# Patient Record
Sex: Male | Born: 1935
Health system: Southern US, Community
[De-identification: ages and names within clinical notes are randomized; demographics above are authoritative.]

## PROBLEM LIST (undated history)

## (undated) ENCOUNTER — Emergency Department (HOSPITAL_BASED_OUTPATIENT_CLINIC_OR_DEPARTMENT_OTHER): Payer: PPO

## (undated) DIAGNOSIS — N400 Enlarged prostate without lower urinary tract symptoms: Secondary | ICD-10-CM

## (undated) DIAGNOSIS — G4733 Obstructive sleep apnea (adult) (pediatric): Secondary | ICD-10-CM

## (undated) DIAGNOSIS — E669 Obesity, unspecified: Secondary | ICD-10-CM

## (undated) DIAGNOSIS — J449 Chronic obstructive pulmonary disease, unspecified: Secondary | ICD-10-CM

## (undated) DIAGNOSIS — N200 Calculus of kidney: Secondary | ICD-10-CM

## (undated) DIAGNOSIS — E785 Hyperlipidemia, unspecified: Secondary | ICD-10-CM

## (undated) DIAGNOSIS — I509 Heart failure, unspecified: Secondary | ICD-10-CM

## (undated) DIAGNOSIS — I340 Nonrheumatic mitral (valve) insufficiency: Secondary | ICD-10-CM

## (undated) DIAGNOSIS — I5032 Chronic diastolic (congestive) heart failure: Secondary | ICD-10-CM

## (undated) DIAGNOSIS — R5383 Other fatigue: Secondary | ICD-10-CM

## (undated) DIAGNOSIS — K589 Irritable bowel syndrome without diarrhea: Secondary | ICD-10-CM

## (undated) DIAGNOSIS — I4891 Unspecified atrial fibrillation: Secondary | ICD-10-CM

## (undated) DIAGNOSIS — J9611 Chronic respiratory failure with hypoxia: Secondary | ICD-10-CM

## (undated) DIAGNOSIS — I4821 Permanent atrial fibrillation: Secondary | ICD-10-CM

## (undated) DIAGNOSIS — R32 Unspecified urinary incontinence: Secondary | ICD-10-CM

## (undated) DIAGNOSIS — J849 Interstitial pulmonary disease, unspecified: Secondary | ICD-10-CM

## (undated) DIAGNOSIS — I700 Atherosclerosis of aorta: Secondary | ICD-10-CM

## (undated) DIAGNOSIS — T7840XA Allergy, unspecified, initial encounter: Secondary | ICD-10-CM

## (undated) DIAGNOSIS — H348192 Central retinal vein occlusion, unspecified eye, stable: Secondary | ICD-10-CM

## (undated) DIAGNOSIS — I671 Cerebral aneurysm, nonruptured: Secondary | ICD-10-CM

## (undated) DIAGNOSIS — I639 Cerebral infarction, unspecified: Secondary | ICD-10-CM

## (undated) DIAGNOSIS — Z860100 Personal history of colon polyps, unspecified: Secondary | ICD-10-CM

## (undated) DIAGNOSIS — Z87898 Personal history of other specified conditions: Secondary | ICD-10-CM

## (undated) DIAGNOSIS — I77810 Thoracic aortic ectasia: Secondary | ICD-10-CM

## (undated) HISTORY — DX: Allergy, unspecified, initial encounter: T78.40XA

## (undated) HISTORY — DX: Benign prostatic hyperplasia without lower urinary tract symptoms: N40.0

## (undated) HISTORY — DX: Personal history of colonic polyps: Z86.010

## (undated) HISTORY — DX: Central retinal vein occlusion, unspecified eye, stable: H34.8192

## (undated) HISTORY — PX: HERNIA REPAIR: SHX51

## (undated) HISTORY — DX: Hyperlipidemia, unspecified: E78.5

## (undated) HISTORY — DX: Unspecified atrial fibrillation: I48.91

## (undated) HISTORY — PX: CHOLECYSTECTOMY: SHX55

## (undated) HISTORY — DX: Cerebral aneurysm, nonruptured: I67.1

## (undated) HISTORY — DX: Irritable bowel syndrome, unspecified: K58.9

## (undated) HISTORY — PX: OTHER SURGICAL HISTORY: SHX169

## (undated) HISTORY — DX: Unspecified urinary incontinence: R32

## (undated) HISTORY — DX: Personal history of colon polyps, unspecified: Z86.0100

## (undated) HISTORY — DX: Personal history of other specified conditions: Z87.898

## (undated) HISTORY — DX: Other fatigue: R53.83

## (undated) HISTORY — DX: Obstructive sleep apnea (adult) (pediatric): G47.33

## (undated) HISTORY — DX: Obesity, unspecified: E66.9

## (undated) HISTORY — PX: LITHOTRIPSY: SUR834

## (undated) HISTORY — PX: TONSILLECTOMY AND ADENOIDECTOMY: SHX28

---

## 1997-12-10 HISTORY — PX: CARDIAC CATHETERIZATION: SHX172

## 1998-11-14 ENCOUNTER — Encounter: Payer: Self-pay | Admitting: Emergency Medicine

## 1998-11-14 ENCOUNTER — Inpatient Hospital Stay (HOSPITAL_COMMUNITY): Admission: EM | Admit: 1998-11-14 | Discharge: 1998-11-15 | Payer: Self-pay | Admitting: Emergency Medicine

## 1998-11-16 ENCOUNTER — Inpatient Hospital Stay (HOSPITAL_COMMUNITY): Admission: EM | Admit: 1998-11-16 | Discharge: 1998-11-18 | Payer: Self-pay | Admitting: Internal Medicine

## 2001-11-05 ENCOUNTER — Encounter: Payer: Self-pay | Admitting: Gastroenterology

## 2001-12-31 ENCOUNTER — Ambulatory Visit (HOSPITAL_COMMUNITY): Admission: RE | Admit: 2001-12-31 | Discharge: 2001-12-31 | Payer: Self-pay | Admitting: Otolaryngology

## 2001-12-31 ENCOUNTER — Encounter: Payer: Self-pay | Admitting: Otolaryngology

## 2002-04-02 ENCOUNTER — Ambulatory Visit (HOSPITAL_COMMUNITY): Admission: RE | Admit: 2002-04-02 | Discharge: 2002-04-02 | Payer: Self-pay | Admitting: Neurosurgery

## 2002-04-02 ENCOUNTER — Encounter: Payer: Self-pay | Admitting: Neurosurgery

## 2002-04-16 ENCOUNTER — Ambulatory Visit (HOSPITAL_COMMUNITY): Admission: RE | Admit: 2002-04-16 | Discharge: 2002-04-16 | Payer: Self-pay | Admitting: Interventional Radiology

## 2002-05-15 ENCOUNTER — Ambulatory Visit: Admission: RE | Admit: 2002-05-15 | Discharge: 2002-05-15 | Payer: Self-pay | Admitting: Interventional Radiology

## 2002-06-15 ENCOUNTER — Ambulatory Visit (HOSPITAL_COMMUNITY): Admission: RE | Admit: 2002-06-15 | Discharge: 2002-06-15 | Payer: Self-pay | Admitting: Interventional Radiology

## 2002-10-09 ENCOUNTER — Ambulatory Visit (HOSPITAL_COMMUNITY): Admission: RE | Admit: 2002-10-09 | Discharge: 2002-10-09 | Payer: Self-pay | Admitting: Interventional Radiology

## 2003-05-28 ENCOUNTER — Ambulatory Visit (HOSPITAL_COMMUNITY): Admission: RE | Admit: 2003-05-28 | Discharge: 2003-05-28 | Payer: Self-pay | Admitting: Interventional Radiology

## 2003-06-03 ENCOUNTER — Encounter: Admission: RE | Admit: 2003-06-03 | Discharge: 2003-06-03 | Payer: Self-pay | Admitting: Interventional Radiology

## 2003-06-24 ENCOUNTER — Ambulatory Visit (HOSPITAL_COMMUNITY): Admission: RE | Admit: 2003-06-24 | Discharge: 2003-06-24 | Payer: Self-pay | Admitting: Interventional Radiology

## 2003-08-02 ENCOUNTER — Encounter: Payer: Self-pay | Admitting: Internal Medicine

## 2003-08-02 ENCOUNTER — Encounter: Admission: RE | Admit: 2003-08-02 | Discharge: 2003-08-02 | Payer: Self-pay | Admitting: Internal Medicine

## 2003-08-30 ENCOUNTER — Encounter: Payer: Self-pay | Admitting: Radiology

## 2003-08-30 ENCOUNTER — Encounter: Admission: RE | Admit: 2003-08-30 | Discharge: 2003-08-30 | Payer: Self-pay | Admitting: Neurosurgery

## 2003-08-30 ENCOUNTER — Encounter: Payer: Self-pay | Admitting: Neurosurgery

## 2003-09-15 ENCOUNTER — Encounter: Payer: Self-pay | Admitting: Neurosurgery

## 2003-09-15 ENCOUNTER — Encounter: Admission: RE | Admit: 2003-09-15 | Discharge: 2003-09-15 | Payer: Self-pay | Admitting: Neurosurgery

## 2003-10-01 ENCOUNTER — Encounter: Admission: RE | Admit: 2003-10-01 | Discharge: 2003-10-01 | Payer: Self-pay | Admitting: Neurosurgery

## 2003-10-01 ENCOUNTER — Encounter: Payer: Self-pay | Admitting: Neurosurgery

## 2003-12-28 ENCOUNTER — Ambulatory Visit (HOSPITAL_BASED_OUTPATIENT_CLINIC_OR_DEPARTMENT_OTHER): Admission: RE | Admit: 2003-12-28 | Discharge: 2003-12-28 | Payer: Self-pay | Admitting: Internal Medicine

## 2004-02-07 ENCOUNTER — Ambulatory Visit (HOSPITAL_COMMUNITY): Admission: RE | Admit: 2004-02-07 | Discharge: 2004-02-07 | Payer: Self-pay | Admitting: Neurosurgery

## 2004-11-29 ENCOUNTER — Ambulatory Visit: Payer: Self-pay | Admitting: Internal Medicine

## 2005-01-15 ENCOUNTER — Ambulatory Visit (HOSPITAL_COMMUNITY): Admission: RE | Admit: 2005-01-15 | Discharge: 2005-01-15 | Payer: Self-pay | Admitting: Neurosurgery

## 2005-03-06 ENCOUNTER — Ambulatory Visit: Payer: Self-pay | Admitting: Gastroenterology

## 2005-06-07 ENCOUNTER — Ambulatory Visit: Payer: Self-pay | Admitting: Internal Medicine

## 2005-08-15 ENCOUNTER — Ambulatory Visit: Payer: Self-pay | Admitting: Internal Medicine

## 2005-09-24 ENCOUNTER — Ambulatory Visit: Payer: Self-pay | Admitting: Internal Medicine

## 2005-12-10 DIAGNOSIS — I4891 Unspecified atrial fibrillation: Secondary | ICD-10-CM

## 2005-12-10 HISTORY — DX: Unspecified atrial fibrillation: I48.91

## 2005-12-18 ENCOUNTER — Ambulatory Visit: Payer: Self-pay | Admitting: Internal Medicine

## 2005-12-28 ENCOUNTER — Ambulatory Visit: Payer: Self-pay | Admitting: Internal Medicine

## 2005-12-28 ENCOUNTER — Encounter: Payer: Self-pay | Admitting: Internal Medicine

## 2006-01-01 ENCOUNTER — Ambulatory Visit: Payer: Self-pay | Admitting: Internal Medicine

## 2006-01-03 ENCOUNTER — Encounter: Payer: Self-pay | Admitting: Cardiology

## 2006-01-03 ENCOUNTER — Ambulatory Visit: Payer: Self-pay

## 2006-01-03 ENCOUNTER — Encounter: Payer: Self-pay | Admitting: Internal Medicine

## 2006-01-04 ENCOUNTER — Ambulatory Visit: Payer: Self-pay | Admitting: Internal Medicine

## 2006-01-08 ENCOUNTER — Ambulatory Visit (HOSPITAL_COMMUNITY): Admission: RE | Admit: 2006-01-08 | Discharge: 2006-01-08 | Payer: Self-pay | Admitting: Neurosurgery

## 2006-01-10 ENCOUNTER — Ambulatory Visit: Payer: Self-pay | Admitting: Internal Medicine

## 2006-01-17 ENCOUNTER — Ambulatory Visit: Payer: Self-pay | Admitting: Internal Medicine

## 2006-01-23 ENCOUNTER — Ambulatory Visit (HOSPITAL_COMMUNITY): Admission: RE | Admit: 2006-01-23 | Discharge: 2006-01-23 | Payer: Self-pay | Admitting: Neurosurgery

## 2006-01-30 ENCOUNTER — Ambulatory Visit: Payer: Self-pay | Admitting: Internal Medicine

## 2006-01-31 ENCOUNTER — Ambulatory Visit: Payer: Self-pay | Admitting: Internal Medicine

## 2006-01-31 ENCOUNTER — Encounter: Admission: RE | Admit: 2006-01-31 | Discharge: 2006-01-31 | Payer: Self-pay | Admitting: Internal Medicine

## 2006-02-07 ENCOUNTER — Ambulatory Visit: Payer: Self-pay | Admitting: Internal Medicine

## 2006-02-08 ENCOUNTER — Ambulatory Visit: Payer: Self-pay

## 2006-02-08 ENCOUNTER — Ambulatory Visit: Payer: Self-pay | Admitting: Internal Medicine

## 2006-02-14 ENCOUNTER — Ambulatory Visit: Payer: Self-pay | Admitting: Internal Medicine

## 2006-02-25 ENCOUNTER — Ambulatory Visit: Payer: Self-pay | Admitting: Internal Medicine

## 2006-03-19 ENCOUNTER — Ambulatory Visit: Payer: Self-pay | Admitting: Internal Medicine

## 2006-03-21 ENCOUNTER — Ambulatory Visit: Payer: Self-pay | Admitting: Internal Medicine

## 2006-03-24 ENCOUNTER — Emergency Department (HOSPITAL_COMMUNITY): Admission: EM | Admit: 2006-03-24 | Discharge: 2006-03-25 | Payer: Self-pay | Admitting: Emergency Medicine

## 2006-04-16 ENCOUNTER — Ambulatory Visit: Payer: Self-pay | Admitting: Internal Medicine

## 2006-05-21 ENCOUNTER — Ambulatory Visit: Payer: Self-pay | Admitting: Internal Medicine

## 2006-05-24 ENCOUNTER — Ambulatory Visit: Payer: Self-pay | Admitting: Internal Medicine

## 2006-06-06 ENCOUNTER — Ambulatory Visit: Payer: Self-pay | Admitting: Internal Medicine

## 2006-06-18 ENCOUNTER — Ambulatory Visit: Payer: Self-pay | Admitting: Internal Medicine

## 2006-06-21 ENCOUNTER — Encounter: Payer: Self-pay | Admitting: Internal Medicine

## 2006-07-18 ENCOUNTER — Ambulatory Visit: Payer: Self-pay | Admitting: Internal Medicine

## 2006-08-20 ENCOUNTER — Ambulatory Visit: Payer: Self-pay | Admitting: Internal Medicine

## 2006-08-29 ENCOUNTER — Ambulatory Visit: Payer: Self-pay | Admitting: Internal Medicine

## 2006-09-24 ENCOUNTER — Ambulatory Visit: Payer: Self-pay | Admitting: Internal Medicine

## 2006-10-28 ENCOUNTER — Ambulatory Visit: Payer: Self-pay | Admitting: Internal Medicine

## 2006-11-18 ENCOUNTER — Ambulatory Visit: Payer: Self-pay | Admitting: Internal Medicine

## 2006-11-25 ENCOUNTER — Ambulatory Visit: Payer: Self-pay | Admitting: Internal Medicine

## 2006-12-24 ENCOUNTER — Ambulatory Visit: Payer: Self-pay | Admitting: Internal Medicine

## 2006-12-24 LAB — CONVERTED CEMR LAB
ALT: 15 units/L (ref 0–40)
AST: 22 units/L (ref 0–37)
Albumin: 3.4 g/dL — ABNORMAL LOW (ref 3.5–5.2)
Alkaline Phosphatase: 78 units/L (ref 39–117)
BUN: 12 mg/dL (ref 6–23)
Bilirubin, Direct: 0.1 mg/dL (ref 0.0–0.3)
CO2: 28 meq/L (ref 19–32)
Calcium: 9.3 mg/dL (ref 8.4–10.5)
Chloride: 106 meq/L (ref 96–112)
Cholesterol: 195 mg/dL (ref 0–200)
Creatinine, Ser: 1.2 mg/dL (ref 0.4–1.5)
Direct LDL: 91.9 mg/dL
GFR calc Af Amer: 77 mL/min
GFR calc non Af Amer: 63 mL/min
Glucose, Bld: 138 mg/dL — ABNORMAL HIGH (ref 70–99)
HDL: 36.2 mg/dL — ABNORMAL LOW (ref >39.0)
Hgb A1c MFr Bld: 6.4 % — ABNORMAL HIGH (ref 4.6–6.0)
Potassium: 5.1 meq/L (ref 3.5–5.1)
Sodium: 141 meq/L (ref 135–145)
Total Bilirubin: 1.2 mg/dL (ref 0.3–1.2)
Total CHOL/HDL Ratio: 5.4
Total Protein: 6.1 g/dL (ref 6.0–8.3)
Triglycerides: 473 mg/dL (ref 0–149)
VLDL: 95 mg/dL — ABNORMAL HIGH (ref 0–40)

## 2007-01-21 ENCOUNTER — Ambulatory Visit: Payer: Self-pay | Admitting: Internal Medicine

## 2007-01-27 ENCOUNTER — Ambulatory Visit (HOSPITAL_COMMUNITY): Admission: RE | Admit: 2007-01-27 | Discharge: 2007-01-27 | Payer: Self-pay | Admitting: Neurosurgery

## 2007-02-18 ENCOUNTER — Ambulatory Visit: Payer: Self-pay | Admitting: Internal Medicine

## 2007-03-21 ENCOUNTER — Ambulatory Visit: Payer: Self-pay | Admitting: Internal Medicine

## 2007-04-23 ENCOUNTER — Ambulatory Visit: Payer: Self-pay | Admitting: Internal Medicine

## 2007-04-23 LAB — CONVERTED CEMR LAB
ALT: 17 units/L (ref 0–40)
AST: 18 units/L (ref 0–37)
Albumin: 3.7 g/dL (ref 3.5–5.2)
Alkaline Phosphatase: 78 units/L (ref 39–117)
BUN: 17 mg/dL (ref 6–23)
Bilirubin, Direct: 0.1 mg/dL (ref 0.0–0.3)
CO2: 31 meq/L (ref 19–32)
Calcium: 9.1 mg/dL (ref 8.4–10.5)
Chloride: 107 meq/L (ref 96–112)
Cholesterol: 191 mg/dL (ref 0–200)
Creatinine, Ser: 1 mg/dL (ref 0.4–1.5)
Direct LDL: 100.1 mg/dL
GFR calc Af Amer: 95 mL/min
GFR calc non Af Amer: 78 mL/min
Glucose, Bld: 107 mg/dL — ABNORMAL HIGH (ref 70–99)
HDL: 40 mg/dL (ref >39.0)
Hgb A1c MFr Bld: 6.3 % — ABNORMAL HIGH (ref 4.6–6.0)
Potassium: 3.8 meq/L (ref 3.5–5.1)
Sodium: 145 meq/L (ref 135–145)
Total Bilirubin: 1.2 mg/dL (ref 0.3–1.2)
Total CHOL/HDL Ratio: 4.8
Total Protein: 7 g/dL (ref 6.0–8.3)
Triglycerides: 257 mg/dL (ref 0–149)
VLDL: 51 mg/dL — ABNORMAL HIGH (ref 0–40)

## 2007-04-30 ENCOUNTER — Ambulatory Visit: Payer: Self-pay | Admitting: Internal Medicine

## 2007-05-25 ENCOUNTER — Emergency Department (HOSPITAL_COMMUNITY): Admission: EM | Admit: 2007-05-25 | Discharge: 2007-05-26 | Payer: Self-pay | Admitting: Emergency Medicine

## 2007-06-05 ENCOUNTER — Ambulatory Visit: Payer: Self-pay | Admitting: *Deleted

## 2007-06-11 ENCOUNTER — Ambulatory Visit: Payer: Self-pay | Admitting: Internal Medicine

## 2007-07-14 ENCOUNTER — Ambulatory Visit: Payer: Self-pay | Admitting: Internal Medicine

## 2007-07-14 DIAGNOSIS — I4821 Permanent atrial fibrillation: Secondary | ICD-10-CM | POA: Insufficient documentation

## 2007-07-14 LAB — CONVERTED CEMR LAB
INR: 2
Prothrombin Time: 17.3 s

## 2007-07-23 DIAGNOSIS — Z860100 Personal history of colon polyps, unspecified: Secondary | ICD-10-CM | POA: Insufficient documentation

## 2007-07-23 DIAGNOSIS — E291 Testicular hypofunction: Secondary | ICD-10-CM | POA: Insufficient documentation

## 2007-07-23 DIAGNOSIS — E785 Hyperlipidemia, unspecified: Secondary | ICD-10-CM | POA: Insufficient documentation

## 2007-07-24 DIAGNOSIS — I7771 Dissection of carotid artery: Secondary | ICD-10-CM | POA: Insufficient documentation

## 2007-07-24 DIAGNOSIS — G4733 Obstructive sleep apnea (adult) (pediatric): Secondary | ICD-10-CM | POA: Insufficient documentation

## 2007-08-12 ENCOUNTER — Ambulatory Visit: Payer: Self-pay | Admitting: Internal Medicine

## 2007-08-13 ENCOUNTER — Ambulatory Visit: Payer: Self-pay | Admitting: Internal Medicine

## 2007-08-13 LAB — CONVERTED CEMR LAB
INR: 1.8
Prothrombin Time: 16.3 s

## 2007-08-26 ENCOUNTER — Ambulatory Visit: Payer: Self-pay | Admitting: Internal Medicine

## 2007-08-26 LAB — CONVERTED CEMR LAB
INR: 2.2
Prothrombin Time: 18 s

## 2007-09-11 ENCOUNTER — Ambulatory Visit: Payer: Self-pay | Admitting: Internal Medicine

## 2007-09-11 LAB — CONVERTED CEMR LAB
INR: 2.1
Prothrombin Time: 17.8 s

## 2007-10-13 ENCOUNTER — Ambulatory Visit: Payer: Self-pay | Admitting: Internal Medicine

## 2007-10-13 LAB — CONVERTED CEMR LAB
INR: 2
Prothrombin Time: 17.5 s

## 2007-11-19 ENCOUNTER — Ambulatory Visit: Payer: Self-pay | Admitting: Internal Medicine

## 2007-11-19 LAB — CONVERTED CEMR LAB
INR: 2.1
Prothrombin Time: 17.7 s

## 2007-12-18 ENCOUNTER — Ambulatory Visit: Payer: Self-pay | Admitting: Internal Medicine

## 2007-12-19 ENCOUNTER — Ambulatory Visit: Payer: Self-pay | Admitting: Internal Medicine

## 2007-12-19 DIAGNOSIS — R972 Elevated prostate specific antigen [PSA]: Secondary | ICD-10-CM | POA: Insufficient documentation

## 2007-12-19 DIAGNOSIS — D649 Anemia, unspecified: Secondary | ICD-10-CM | POA: Insufficient documentation

## 2007-12-19 LAB — CONVERTED CEMR LAB
ALT: 18 units/L (ref 0–53)
CO2: 28 meq/L (ref 19–32)
Calcium: 9.3 mg/dL (ref 8.4–10.5)
Chloride: 106 meq/L (ref 96–112)
GFR calc Af Amer: 94 mL/min
GFR calc non Af Amer: 78 mL/min
Glucose, Bld: 111 mg/dL — ABNORMAL HIGH (ref 70–99)
HCT: 45.3 % (ref 39.0–52.0)
Hemoglobin: 15.5 g/dL (ref 13.0–17.0)
MCHC: 34.3 g/dL (ref 30.0–36.0)
MCV: 88.3 fL (ref 78.0–100.0)
Monocytes Relative: 5.8 % (ref 3.0–11.0)
Neutro Abs: 3.6 10*3/uL (ref 1.4–7.7)
Neutrophils Relative %: 62.7 % (ref 43.0–77.0)
Platelets: 149 10*3/uL — ABNORMAL LOW (ref 150–400)
Potassium: 5.4 meq/L — ABNORMAL HIGH (ref 3.5–5.1)
Prothrombin Time: 17 s
Sodium: 141 meq/L (ref 135–145)
Total Bilirubin: 1.3 mg/dL — ABNORMAL HIGH (ref 0.3–1.2)
Triglycerides: 201 mg/dL (ref 0–149)
VLDL: 40 mg/dL (ref 0–40)
WBC: 5.6 10*3/uL (ref 4.5–10.5)

## 2007-12-25 ENCOUNTER — Ambulatory Visit: Payer: Self-pay | Admitting: Internal Medicine

## 2008-01-12 ENCOUNTER — Ambulatory Visit: Payer: Self-pay | Admitting: Internal Medicine

## 2008-01-19 ENCOUNTER — Ambulatory Visit: Payer: Self-pay

## 2008-01-19 ENCOUNTER — Ambulatory Visit: Payer: Self-pay | Admitting: Internal Medicine

## 2008-01-19 ENCOUNTER — Encounter: Payer: Self-pay | Admitting: Internal Medicine

## 2008-01-19 LAB — CONVERTED CEMR LAB
Digitoxin Lvl: 1.1 ng/mL (ref 0.8–2.0)
INR: 1.9
Prothrombin Time: 17.1 s

## 2008-02-16 ENCOUNTER — Ambulatory Visit: Payer: Self-pay | Admitting: Internal Medicine

## 2008-02-16 LAB — CONVERTED CEMR LAB
INR: 2.6
Prothrombin Time: 19.7 s

## 2008-03-10 LAB — HM COLONOSCOPY

## 2008-03-15 ENCOUNTER — Ambulatory Visit: Payer: Self-pay | Admitting: Internal Medicine

## 2008-03-15 LAB — CONVERTED CEMR LAB
INR: 2.5
Prothrombin Time: 19.3 s

## 2008-03-23 ENCOUNTER — Ambulatory Visit: Payer: Self-pay | Admitting: Gastroenterology

## 2008-04-07 ENCOUNTER — Ambulatory Visit: Payer: Self-pay | Admitting: Gastroenterology

## 2008-04-07 ENCOUNTER — Encounter: Payer: Self-pay | Admitting: Internal Medicine

## 2008-04-13 ENCOUNTER — Ambulatory Visit: Payer: Self-pay | Admitting: Internal Medicine

## 2008-04-14 ENCOUNTER — Ambulatory Visit: Payer: Self-pay | Admitting: Internal Medicine

## 2008-04-15 ENCOUNTER — Ambulatory Visit: Payer: Self-pay | Admitting: Internal Medicine

## 2008-05-12 ENCOUNTER — Ambulatory Visit: Payer: Self-pay | Admitting: Internal Medicine

## 2008-05-31 ENCOUNTER — Ambulatory Visit: Payer: Self-pay | Admitting: Internal Medicine

## 2008-06-10 ENCOUNTER — Ambulatory Visit: Payer: Self-pay | Admitting: Internal Medicine

## 2008-06-10 LAB — CONVERTED CEMR LAB
AST: 24 units/L (ref 0–37)
Albumin: 3.5 g/dL (ref 3.5–5.2)
Alkaline Phosphatase: 63 units/L (ref 39–117)
BUN: 12 mg/dL (ref 6–23)
Cholesterol: 178 mg/dL (ref 0–200)
Creatinine, Ser: 1 mg/dL (ref 0.4–1.5)
GFR calc Af Amer: 94 mL/min
GFR calc non Af Amer: 78 mL/min
Glucose, Bld: 110 mg/dL — ABNORMAL HIGH (ref 70–99)
HDL: 42.5 mg/dL (ref >39.0)
Hgb A1c MFr Bld: 6.1 % — ABNORMAL HIGH (ref 4.6–6.0)
INR: 3.2
Prothrombin Time: 21.6 s
Total Protein: 6.4 g/dL (ref 6.0–8.3)
VLDL: 41 mg/dL — ABNORMAL HIGH (ref 0–40)

## 2008-06-18 ENCOUNTER — Ambulatory Visit: Payer: Self-pay | Admitting: Internal Medicine

## 2008-06-18 DIAGNOSIS — N400 Enlarged prostate without lower urinary tract symptoms: Secondary | ICD-10-CM | POA: Insufficient documentation

## 2008-06-18 LAB — CONVERTED CEMR LAB
Bilirubin Urine: NEGATIVE
Glucose, Urine, Semiquant: NEGATIVE
Urobilinogen, UA: 2
WBC Urine, dipstick: NEGATIVE

## 2008-06-28 ENCOUNTER — Telehealth (INDEPENDENT_AMBULATORY_CARE_PROVIDER_SITE_OTHER): Payer: Self-pay | Admitting: *Deleted

## 2008-07-16 ENCOUNTER — Ambulatory Visit: Payer: Self-pay | Admitting: Internal Medicine

## 2008-07-16 LAB — CONVERTED CEMR LAB: INR: 2.8

## 2008-08-27 ENCOUNTER — Ambulatory Visit: Payer: Self-pay | Admitting: Internal Medicine

## 2008-08-27 LAB — CONVERTED CEMR LAB: INR: 2.6

## 2008-09-14 ENCOUNTER — Ambulatory Visit: Payer: Self-pay | Admitting: Internal Medicine

## 2008-09-24 ENCOUNTER — Ambulatory Visit: Payer: Self-pay | Admitting: Internal Medicine

## 2008-09-24 LAB — CONVERTED CEMR LAB: INR: 3.6

## 2008-10-25 ENCOUNTER — Ambulatory Visit: Payer: Self-pay | Admitting: Internal Medicine

## 2008-11-12 ENCOUNTER — Encounter: Admission: RE | Admit: 2008-11-12 | Discharge: 2008-11-12 | Payer: Self-pay | Admitting: Neurosurgery

## 2008-11-19 ENCOUNTER — Ambulatory Visit: Payer: Self-pay | Admitting: Internal Medicine

## 2008-11-25 ENCOUNTER — Ambulatory Visit: Payer: Self-pay | Admitting: Internal Medicine

## 2008-11-25 LAB — CONVERTED CEMR LAB: INR: 3

## 2008-12-17 ENCOUNTER — Ambulatory Visit: Payer: Self-pay | Admitting: Internal Medicine

## 2008-12-23 LAB — CONVERTED CEMR LAB
ALT: 20 units/L (ref 0–53)
BUN: 13 mg/dL (ref 6–23)
Chloride: 110 meq/L (ref 96–112)
Creatinine, Ser: 1.1 mg/dL (ref 0.4–1.5)
GFR calc Af Amer: 84 mL/min
GFR calc non Af Amer: 70 mL/min
HDL: 46.4 mg/dL (ref >39.0)
Hgb A1c MFr Bld: 5.9 % (ref 4.6–6.0)
Potassium: 5.1 meq/L (ref 3.5–5.1)
Total CHOL/HDL Ratio: 4.4
VLDL: 47 mg/dL — ABNORMAL HIGH (ref 0–40)

## 2008-12-27 ENCOUNTER — Ambulatory Visit: Payer: Self-pay | Admitting: Internal Medicine

## 2009-01-24 ENCOUNTER — Ambulatory Visit: Payer: Self-pay | Admitting: Internal Medicine

## 2009-01-24 LAB — CONVERTED CEMR LAB
INR: 2.3
Prothrombin Time: 18.5 s

## 2009-02-09 ENCOUNTER — Encounter: Payer: Self-pay | Admitting: Internal Medicine

## 2009-02-09 ENCOUNTER — Ambulatory Visit: Payer: Self-pay | Admitting: Internal Medicine

## 2009-02-21 ENCOUNTER — Ambulatory Visit: Payer: Self-pay | Admitting: Internal Medicine

## 2009-02-21 LAB — CONVERTED CEMR LAB: INR: 2.5

## 2009-03-24 ENCOUNTER — Ambulatory Visit: Payer: Self-pay | Admitting: Internal Medicine

## 2009-03-24 LAB — CONVERTED CEMR LAB
INR: 2.1
Prothrombin Time: 17.8 s

## 2009-04-25 ENCOUNTER — Ambulatory Visit: Payer: Self-pay | Admitting: Internal Medicine

## 2009-05-02 ENCOUNTER — Encounter: Payer: Self-pay | Admitting: Internal Medicine

## 2009-05-10 ENCOUNTER — Telehealth: Payer: Self-pay | Admitting: Internal Medicine

## 2009-05-10 ENCOUNTER — Ambulatory Visit: Payer: Self-pay | Admitting: Internal Medicine

## 2009-05-10 LAB — CONVERTED CEMR LAB
INR: 1.7
Prothrombin Time: 15.2 s

## 2009-05-10 LAB — HM DIABETES EYE EXAM: HM Diabetic Eye Exam: NORMAL

## 2009-05-31 ENCOUNTER — Ambulatory Visit: Payer: Self-pay | Admitting: Internal Medicine

## 2009-05-31 LAB — CONVERTED CEMR LAB: INR: 2.3

## 2009-06-06 ENCOUNTER — Ambulatory Visit: Payer: Self-pay | Admitting: Internal Medicine

## 2009-06-06 DIAGNOSIS — F528 Other sexual dysfunction not due to a substance or known physiological condition: Secondary | ICD-10-CM | POA: Insufficient documentation

## 2009-06-06 LAB — CONVERTED CEMR LAB: CRP, High Sensitivity: 4 (ref 0.00–5.00)

## 2009-06-07 LAB — CONVERTED CEMR LAB
AST: 23 units/L (ref 0–37)
Alkaline Phosphatase: 62 units/L (ref 39–117)
Bilirubin, Direct: 0 mg/dL (ref 0.0–0.3)
Direct LDL: 107.3 mg/dL
Eosinophils Relative: 1.3 % (ref 0.0–5.0)
HDL: 48.2 mg/dL (ref >39.00)
Lymphocytes Relative: 22.7 % (ref 12.0–46.0)
Monocytes Absolute: 0.5 10*3/uL (ref 0.1–1.0)
Monocytes Relative: 7.2 % (ref 3.0–12.0)
Neutrophils Relative %: 68.4 % (ref 43.0–77.0)
Platelets: 134 10*3/uL — ABNORMAL LOW (ref 150.0–400.0)
TSH: 2.14 microintl units/mL (ref 0.35–5.50)
Total CHOL/HDL Ratio: 4
VLDL: 41.2 mg/dL — ABNORMAL HIGH (ref 0.0–40.0)
WBC: 6.5 10*3/uL (ref 4.5–10.5)

## 2009-06-28 ENCOUNTER — Ambulatory Visit: Payer: Self-pay | Admitting: Internal Medicine

## 2009-06-28 LAB — CONVERTED CEMR LAB
INR: 2.1
Prothrombin Time: 18 s

## 2009-08-01 ENCOUNTER — Ambulatory Visit: Payer: Self-pay | Admitting: Internal Medicine

## 2009-08-01 LAB — CONVERTED CEMR LAB
INR: 3
Prothrombin Time: 20.9 s

## 2009-08-03 ENCOUNTER — Telehealth (INDEPENDENT_AMBULATORY_CARE_PROVIDER_SITE_OTHER): Payer: Self-pay | Admitting: *Deleted

## 2009-08-20 ENCOUNTER — Ambulatory Visit: Payer: Self-pay | Admitting: Family Medicine

## 2009-09-01 ENCOUNTER — Ambulatory Visit: Payer: Self-pay | Admitting: Internal Medicine

## 2009-09-06 ENCOUNTER — Ambulatory Visit: Payer: Self-pay | Admitting: Internal Medicine

## 2009-09-14 ENCOUNTER — Encounter (INDEPENDENT_AMBULATORY_CARE_PROVIDER_SITE_OTHER): Payer: Self-pay | Admitting: *Deleted

## 2009-09-15 ENCOUNTER — Ambulatory Visit: Payer: Self-pay | Admitting: Internal Medicine

## 2009-09-15 LAB — CONVERTED CEMR LAB: Prothrombin Time: 19.3 s

## 2009-10-03 ENCOUNTER — Ambulatory Visit: Payer: Self-pay | Admitting: Family Medicine

## 2009-10-10 ENCOUNTER — Ambulatory Visit: Payer: Self-pay | Admitting: Internal Medicine

## 2009-10-13 ENCOUNTER — Ambulatory Visit: Payer: Self-pay | Admitting: Internal Medicine

## 2009-10-20 ENCOUNTER — Ambulatory Visit: Payer: Self-pay

## 2009-10-20 ENCOUNTER — Encounter: Payer: Self-pay | Admitting: Internal Medicine

## 2009-10-21 ENCOUNTER — Ambulatory Visit: Payer: Self-pay | Admitting: Family Medicine

## 2009-10-31 ENCOUNTER — Telehealth: Payer: Self-pay | Admitting: Internal Medicine

## 2009-11-02 ENCOUNTER — Ambulatory Visit: Payer: Self-pay | Admitting: Internal Medicine

## 2009-11-02 LAB — CONVERTED CEMR LAB
INR: 1.8
Prothrombin Time: 16.5 s

## 2009-11-18 ENCOUNTER — Telehealth: Payer: Self-pay | Admitting: Gastroenterology

## 2009-11-22 ENCOUNTER — Ambulatory Visit: Payer: Self-pay | Admitting: Gastroenterology

## 2009-11-22 DIAGNOSIS — K219 Gastro-esophageal reflux disease without esophagitis: Secondary | ICD-10-CM | POA: Insufficient documentation

## 2009-11-22 DIAGNOSIS — K589 Irritable bowel syndrome without diarrhea: Secondary | ICD-10-CM | POA: Insufficient documentation

## 2009-11-29 ENCOUNTER — Ambulatory Visit: Payer: Self-pay | Admitting: Internal Medicine

## 2009-11-29 LAB — CONVERTED CEMR LAB
Alkaline Phosphatase: 58 units/L (ref 39–117)
BUN: 10 mg/dL (ref 6–23)
Bilirubin, Direct: 0.1 mg/dL (ref 0.0–0.3)
CO2: 28 meq/L (ref 19–32)
Chloride: 113 meq/L — ABNORMAL HIGH (ref 96–112)
Cholesterol: 154 mg/dL (ref 0–200)
Creatinine, Ser: 1.1 mg/dL (ref 0.4–1.5)
LDL Cholesterol: 86 mg/dL (ref 0–99)
Total CHOL/HDL Ratio: 4
Total Protein: 6.7 g/dL (ref 6.0–8.3)

## 2009-12-05 ENCOUNTER — Ambulatory Visit: Payer: Self-pay | Admitting: Internal Medicine

## 2009-12-15 ENCOUNTER — Inpatient Hospital Stay (HOSPITAL_COMMUNITY): Admission: EM | Admit: 2009-12-15 | Discharge: 2009-12-16 | Payer: Self-pay | Admitting: Internal Medicine

## 2009-12-15 ENCOUNTER — Ambulatory Visit: Payer: Self-pay | Admitting: Internal Medicine

## 2009-12-15 ENCOUNTER — Encounter: Payer: Self-pay | Admitting: Emergency Medicine

## 2009-12-19 ENCOUNTER — Telehealth: Payer: Self-pay | Admitting: Internal Medicine

## 2009-12-19 ENCOUNTER — Ambulatory Visit: Payer: Self-pay | Admitting: Internal Medicine

## 2009-12-19 ENCOUNTER — Ambulatory Visit (HOSPITAL_COMMUNITY)
Admission: RE | Admit: 2009-12-19 | Discharge: 2009-12-19 | Payer: Self-pay | Source: Home / Self Care | Admitting: Internal Medicine

## 2009-12-23 ENCOUNTER — Ambulatory Visit: Payer: Self-pay | Admitting: Internal Medicine

## 2009-12-30 ENCOUNTER — Ambulatory Visit: Payer: Self-pay | Admitting: Internal Medicine

## 2009-12-30 LAB — CONVERTED CEMR LAB: INR: 1.8

## 2010-01-03 ENCOUNTER — Ambulatory Visit: Payer: Self-pay | Admitting: Gastroenterology

## 2010-01-03 DIAGNOSIS — K5730 Diverticulosis of large intestine without perforation or abscess without bleeding: Secondary | ICD-10-CM | POA: Insufficient documentation

## 2010-01-06 ENCOUNTER — Ambulatory Visit: Payer: Self-pay | Admitting: Internal Medicine

## 2010-01-11 ENCOUNTER — Ambulatory Visit: Payer: Self-pay | Admitting: Gastroenterology

## 2010-01-11 ENCOUNTER — Ambulatory Visit: Payer: Self-pay | Admitting: Internal Medicine

## 2010-01-11 LAB — CONVERTED CEMR LAB
Eosinophils Absolute: 0.2 10*3/uL (ref 0.0–0.7)
Ferritin: 46.2 ng/mL (ref 22.0–322.0)
Folate: 9.9 ng/mL
IgA: <30 mg/dL — ABNORMAL LOW (ref 68–378)
Iron: 57 ug/dL (ref 42–165)
MCHC: 33.9 g/dL (ref 30.0–36.0)
MCV: 87 fL (ref 78.0–100.0)
Monocytes Absolute: 0.4 10*3/uL (ref 0.1–1.0)
Neutrophils Relative %: 71 % (ref 43.0–77.0)
Platelets: 165 10*3/uL (ref 150.0–400.0)
Vitamin B-12: 366 pg/mL (ref 211–911)

## 2010-01-12 ENCOUNTER — Ambulatory Visit (HOSPITAL_COMMUNITY): Admission: RE | Admit: 2010-01-12 | Discharge: 2010-01-12 | Payer: Self-pay | Admitting: Internal Medicine

## 2010-01-12 ENCOUNTER — Ambulatory Visit: Payer: Self-pay | Admitting: Internal Medicine

## 2010-01-12 ENCOUNTER — Encounter: Payer: Self-pay | Admitting: Gastroenterology

## 2010-01-12 ENCOUNTER — Other Ambulatory Visit: Payer: Self-pay

## 2010-01-12 ENCOUNTER — Ambulatory Visit: Payer: Self-pay

## 2010-01-16 ENCOUNTER — Encounter: Payer: Self-pay | Admitting: Gastroenterology

## 2010-01-25 ENCOUNTER — Telehealth: Payer: Self-pay | Admitting: Gastroenterology

## 2010-02-03 ENCOUNTER — Ambulatory Visit: Payer: Self-pay | Admitting: Internal Medicine

## 2010-02-16 ENCOUNTER — Ambulatory Visit: Payer: Self-pay | Admitting: Family Medicine

## 2010-02-17 ENCOUNTER — Ambulatory Visit: Payer: Self-pay | Admitting: Family Medicine

## 2010-02-21 ENCOUNTER — Encounter: Admission: RE | Admit: 2010-02-21 | Discharge: 2010-02-21 | Payer: Self-pay | Admitting: Family Medicine

## 2010-03-02 ENCOUNTER — Ambulatory Visit: Payer: Self-pay | Admitting: Internal Medicine

## 2010-03-02 LAB — CONVERTED CEMR LAB
INR: 2.4
Prothrombin Time: 19 s

## 2010-03-08 ENCOUNTER — Encounter: Payer: Self-pay | Admitting: Internal Medicine

## 2010-03-21 ENCOUNTER — Ambulatory Visit: Payer: Self-pay | Admitting: Internal Medicine

## 2010-03-21 LAB — CONVERTED CEMR LAB
ALT: 15 units/L (ref 0–53)
AST: 25 units/L (ref 0–37)
Albumin: 3.8 g/dL (ref 3.5–5.2)
CO2: 29 meq/L (ref 19–32)
Calcium: 9.2 mg/dL (ref 8.4–10.5)
Chloride: 105 meq/L (ref 96–112)
Cholesterol: 194 mg/dL (ref 0–200)
Creatinine, Ser: 1 mg/dL (ref 0.4–1.5)
HDL: 54.3 mg/dL (ref >39.00)
Hgb A1c MFr Bld: 5.9 % (ref 4.6–6.5)
PSA: 0.47 ng/mL (ref 0.10–4.00)
Sodium: 145 meq/L (ref 135–145)
Total Bilirubin: 0.9 mg/dL (ref 0.3–1.2)
Total CHOL/HDL Ratio: 4
Triglycerides: 242 mg/dL — ABNORMAL HIGH (ref 0.0–149.0)

## 2010-03-29 ENCOUNTER — Ambulatory Visit: Payer: Self-pay | Admitting: Internal Medicine

## 2010-03-29 LAB — CONVERTED CEMR LAB: Prothrombin Time: 18.5 s

## 2010-04-12 ENCOUNTER — Ambulatory Visit: Payer: Self-pay | Admitting: Internal Medicine

## 2010-04-12 LAB — CONVERTED CEMR LAB
INR: 3.8
Prothrombin Time: 23.6 s

## 2010-04-28 ENCOUNTER — Encounter: Payer: Self-pay | Admitting: Internal Medicine

## 2010-05-06 ENCOUNTER — Encounter: Admission: RE | Admit: 2010-05-06 | Discharge: 2010-05-06 | Payer: Self-pay | Admitting: Neurosurgery

## 2010-05-15 ENCOUNTER — Ambulatory Visit: Payer: Self-pay | Admitting: Internal Medicine

## 2010-05-15 LAB — CONVERTED CEMR LAB: INR: 2.3

## 2010-05-22 ENCOUNTER — Encounter: Payer: Self-pay | Admitting: Internal Medicine

## 2010-06-13 ENCOUNTER — Ambulatory Visit: Payer: Self-pay | Admitting: Internal Medicine

## 2010-06-26 ENCOUNTER — Telehealth: Payer: Self-pay | Admitting: Gastroenterology

## 2010-06-27 ENCOUNTER — Telehealth: Payer: Self-pay | Admitting: Physician Assistant

## 2010-06-27 ENCOUNTER — Ambulatory Visit: Payer: Self-pay | Admitting: Gastroenterology

## 2010-06-27 DIAGNOSIS — I2510 Atherosclerotic heart disease of native coronary artery without angina pectoris: Secondary | ICD-10-CM | POA: Insufficient documentation

## 2010-06-27 DIAGNOSIS — K648 Other hemorrhoids: Secondary | ICD-10-CM | POA: Insufficient documentation

## 2010-06-27 DIAGNOSIS — G4730 Sleep apnea, unspecified: Secondary | ICD-10-CM | POA: Insufficient documentation

## 2010-06-27 DIAGNOSIS — I10 Essential (primary) hypertension: Secondary | ICD-10-CM | POA: Insufficient documentation

## 2010-06-27 LAB — CONVERTED CEMR LAB
Basophils Relative: 0.5 % (ref 0.0–3.0)
Eosinophils Absolute: 0.1 10*3/uL (ref 0.0–0.7)
Eosinophils Relative: 2.3 % (ref 0.0–5.0)
Hemoglobin: 14.1 g/dL (ref 13.0–17.0)
Lymphocytes Relative: 24.3 % (ref 12.0–46.0)
Monocytes Relative: 6.5 % (ref 3.0–12.0)
Neutro Abs: 3.8 10*3/uL (ref 1.4–7.7)
Neutrophils Relative %: 66.4 % (ref 43.0–77.0)
RBC: 4.76 M/uL (ref 4.22–5.81)
WBC: 5.7 10*3/uL (ref 4.5–10.5)

## 2010-07-03 ENCOUNTER — Ambulatory Visit: Payer: Self-pay | Admitting: Internal Medicine

## 2010-07-10 ENCOUNTER — Ambulatory Visit: Payer: Self-pay | Admitting: Internal Medicine

## 2010-07-20 ENCOUNTER — Ambulatory Visit: Payer: Self-pay | Admitting: Internal Medicine

## 2010-07-20 LAB — CONVERTED CEMR LAB
ALT: 13 units/L (ref 0–53)
AST: 21 units/L (ref 0–37)
Bilirubin, Direct: 0.1 mg/dL (ref 0.0–0.3)
CO2: 29 meq/L (ref 19–32)
Chloride: 109 meq/L (ref 96–112)
Potassium: 5 meq/L (ref 3.5–5.1)
Total Bilirubin: 0.8 mg/dL (ref 0.3–1.2)
Total CHOL/HDL Ratio: 4
VLDL: 48 mg/dL — ABNORMAL HIGH (ref 0.0–40.0)

## 2010-07-24 ENCOUNTER — Ambulatory Visit: Payer: Self-pay | Admitting: Internal Medicine

## 2010-07-31 ENCOUNTER — Ambulatory Visit: Payer: Self-pay | Admitting: Internal Medicine

## 2010-07-31 DIAGNOSIS — E119 Type 2 diabetes mellitus without complications: Secondary | ICD-10-CM | POA: Insufficient documentation

## 2010-08-08 ENCOUNTER — Ambulatory Visit: Payer: Self-pay | Admitting: Gastroenterology

## 2010-08-08 ENCOUNTER — Encounter (INDEPENDENT_AMBULATORY_CARE_PROVIDER_SITE_OTHER): Payer: Self-pay | Admitting: *Deleted

## 2010-08-16 ENCOUNTER — Ambulatory Visit: Payer: Self-pay | Admitting: Gastroenterology

## 2010-08-21 ENCOUNTER — Encounter: Payer: Self-pay | Admitting: Gastroenterology

## 2010-08-28 ENCOUNTER — Ambulatory Visit: Payer: Self-pay | Admitting: Internal Medicine

## 2010-08-30 ENCOUNTER — Telehealth: Payer: Self-pay | Admitting: Gastroenterology

## 2010-08-31 ENCOUNTER — Encounter: Payer: Self-pay | Admitting: Internal Medicine

## 2010-09-13 ENCOUNTER — Encounter: Payer: Self-pay | Admitting: Internal Medicine

## 2010-09-25 ENCOUNTER — Encounter: Payer: Self-pay | Admitting: Internal Medicine

## 2010-10-02 ENCOUNTER — Ambulatory Visit: Payer: Self-pay | Admitting: Internal Medicine

## 2010-10-18 ENCOUNTER — Encounter: Payer: Self-pay | Admitting: Internal Medicine

## 2010-10-30 ENCOUNTER — Ambulatory Visit: Payer: Self-pay | Admitting: Internal Medicine

## 2010-10-30 LAB — CONVERTED CEMR LAB: INR: 2.4

## 2010-11-27 ENCOUNTER — Ambulatory Visit: Payer: Self-pay | Admitting: Internal Medicine

## 2010-12-15 ENCOUNTER — Ambulatory Visit
Admission: RE | Admit: 2010-12-15 | Discharge: 2010-12-15 | Payer: Self-pay | Source: Home / Self Care | Attending: Internal Medicine | Admitting: Internal Medicine

## 2010-12-21 ENCOUNTER — Encounter: Payer: Self-pay | Admitting: Internal Medicine

## 2010-12-25 ENCOUNTER — Ambulatory Visit: Admit: 2010-12-25 | Payer: Self-pay | Admitting: Internal Medicine

## 2010-12-29 ENCOUNTER — Ambulatory Visit
Admission: RE | Admit: 2010-12-29 | Discharge: 2010-12-29 | Payer: Self-pay | Source: Home / Self Care | Attending: Internal Medicine | Admitting: Internal Medicine

## 2010-12-29 LAB — CONVERTED CEMR LAB: INR: 1.9

## 2011-01-01 ENCOUNTER — Encounter: Payer: Self-pay | Admitting: Gastroenterology

## 2011-01-01 ENCOUNTER — Telehealth: Payer: Self-pay | Admitting: Gastroenterology

## 2011-01-02 ENCOUNTER — Encounter: Payer: Self-pay | Admitting: Gastroenterology

## 2011-01-05 ENCOUNTER — Telehealth (INDEPENDENT_AMBULATORY_CARE_PROVIDER_SITE_OTHER): Payer: Self-pay | Admitting: *Deleted

## 2011-01-07 LAB — CONVERTED CEMR LAB
Albumin: 3.6 g/dL (ref 3.5–5.2)
Alkaline Phosphatase: 67 units/L (ref 39–117)
BUN: 13 mg/dL (ref 6–23)
Basophils Relative: 0.4 % (ref 0.0–3.0)
Calcium: 9.9 mg/dL (ref 8.4–10.5)
Creatinine, Ser: 1 mg/dL (ref 0.4–1.5)
Eosinophils Absolute: 0.1 10*3/uL (ref 0.0–0.7)
Eosinophils Relative: 2 % (ref 0.0–5.0)
GFR calc Af Amer: 94 mL/min
GFR calc non Af Amer: 78 mL/min
Glucose, Bld: 102 mg/dL — ABNORMAL HIGH (ref 70–99)
HCT: 44.7 % (ref 39.0–52.0)
Hemoglobin: 15.8 g/dL (ref 13.0–17.0)
MCV: 88.6 fL (ref 78.0–100.0)
Monocytes Absolute: 0.5 10*3/uL (ref 0.1–1.0)
Monocytes Relative: 7.6 % (ref 3.0–12.0)
Neutro Abs: 3.8 10*3/uL (ref 1.4–7.7)
Potassium: 4.6 meq/L (ref 3.5–5.1)
TSH: 1.89 microintl units/mL (ref 0.35–5.50)
Total Protein: 6.7 g/dL (ref 6.0–8.3)
WBC: 6 10*3/uL (ref 4.5–10.5)

## 2011-01-11 NOTE — Assessment & Plan Note (Signed)
Summary: pt/cjr   Nurse Visit   Allergies: 1)  ! Biaxin 2)  ! Epinephrine 3)  ! Nimodipine (Nimodipine) Laboratory Results   Blood Tests     PT: 23.6 s   (Normal Range: 10.6-13.4)  INR: 3.8   (Normal Range: 0.88-1.12   Therap INR: 2.0-3.5) Comments: [name]  Apr 12, 2010 2:04 PM     Orders Added: 1)  Est. Patient Level I [99211] 2)  Protime [04540JW]    ANTICOAGULATION RECORD PREVIOUS REGIMEN & LAB RESULTS Anticoagulation Diagnosis:  v58.83,v58.61,427.31 on  07/14/2007 Previous INR Goal Range:  2.0-3.0 on  05/12/2008 Previous INR:  2.3 on  03/29/2010 Previous Coumadin Dose(mg):  2.5 Tues,Thurs others 5mg  on  09/15/2009 Previous Regimen:  same on  03/02/2010 Previous Coagulation Comments:  By.K on  09/24/2008  NEW REGIMEN & LAB RESULTS Current INR: 3.8 Regimen: hold 2 days then resume Coagulation Comments: Dr. Lovell Sheehan approved Repeat testing in: 1 month  Anticoagulation Visit Questionnaire Coumadin dose missed/changed:  No Abnormal Bleeding Symptoms:  No  Any diet changes including alcohol intake, vegetables or greens since the last visit:  No Any illnesses or hospitalizations since the last visit:  No Any signs of clotting since the last visit (including chest discomfort, dizziness, shortness of breath, arm tingling, slurred speech, swelling or redness in leg):  No  MEDICATIONS WARFARIN SODIUM 5 MG TABS (WARFARIN SODIUM) qd as directed except 2.5 mg T-TH CALTRATE 600+D 600-400 MG-UNIT  TABS (CALCIUM CARBONATE-VITAMIN D) one tab two times a day ASPIRIN 81 MG  TBEC (ASPIRIN) once daily ZYRTEC ALLERGY 10 MG TABS (CETIRIZINE HCL) once daily as needed FISH OIL 1200 MG CAPS (OMEGA-3 FATTY ACIDS) Take 1 tablet by mouth once a day VERAMYST 27.5 MCG/SPRAY SUSP (FLUTICASONE FUROATE) as needed DITROPAN XL 5 MG XR24H-TAB (OXYBUTYNIN CHLORIDE) Take one tablet once a day OMEPRAZOLE 20 MG TBEC (OMEPRAZOLE) Take one prior to breakfast every other day MULTIVITAMINS    TABS (MULTIPLE VITAMIN) one tablet by mouth once daily [employer]  CAPS (PROBIOTIC PRODUCT) once daily

## 2011-01-11 NOTE — Assessment & Plan Note (Signed)
Summary: PT/PS   Nurse Visit   Allergies: 1)  ! Biaxin 2)  ! Epinephrine 3)  ! Nimodipine (Nimodipine) Laboratory Results   Blood Tests     PT: 13.9 s   (Normal Range: 10.6-13.4)  INR: 1.3   (Normal Range: 0.88-1.12   Therap INR: 2.0-3.5) Comments: [name]  December 23, 2009 2:09 PM     Orders Added: 1)  Est. Patient Level I [99211] 2)  Protime [04540JW]   ANTICOAGULATION RECORD PREVIOUS REGIMEN & LAB RESULTS Anticoagulation Diagnosis:  v58.83,v58.61,427.31 on  07/14/2007 Previous INR Goal Range:  2.0-3.0 on  05/12/2008 Previous INR:  2.7 on  12/05/2009 Previous Coumadin Dose(mg):  2.5 Tues,Thurs others 5mg  on  09/15/2009 Previous Regimen:  RTO 2 wks. Same dosage. on  12/05/2009 Previous Coagulation Comments:  By.K on  09/24/2008  NEW REGIMEN & LAB RESULTS Current INR: 1.3 Regimen: same  Repeat testing in: 1 week  Anticoagulation Visit Questionnaire Coumadin dose missed/changed:  Yes Coumadin Dose Comments:  Didn't take from Jan.6-Jan.9 because of being in the hospital Abnormal Bleeding Symptoms:  No  Any diet changes including alcohol intake, vegetables or greens since the last visit:  No Any illnesses or hospitalizations since the last visit:  Yes Any signs of clotting since the last visit (including chest discomfort, dizziness, shortness of breath, arm tingling, slurred speech, swelling or redness in leg):  No  MEDICATIONS WARFARIN SODIUM 5 MG TABS (WARFARIN SODIUM) qd as directed except 2.5 mg M-W-F CALTRATE 600+D 600-400 MG-UNIT  TABS (CALCIUM CARBONATE-VITAMIN D) one tab two times a day ASPIRIN 81 MG  TBEC (ASPIRIN) once daily ZYRTEC ALLERGY 10 MG TABS (CETIRIZINE HCL) once daily as needed FISH OIL 1200 MG CAPS (OMEGA-3 FATTY ACIDS) Take 1 tablet by mouth once a day VERAMYST 27.5 MCG/SPRAY SUSP (FLUTICASONE FUROATE) as needed DITROPAN XL 5 MG XR24H-TAB (OXYBUTYNIN CHLORIDE) Take two tablets once a day OMEPRAZOLE 40 MG CPDR (OMEPRAZOLE) Take 1  tab 30 min prior to breakfast every day

## 2011-01-11 NOTE — Procedures (Signed)
Summary: Upper Endoscopy  Patient: Maurice Wood Note: All result statuses are Final unless otherwise noted.  Tests: (1) Upper Endoscopy (EGD)   EGD Upper Endoscopy       DONE     [facility]     [address]     [place], [place]  [address]           ENDOSCOPY PROCEDURE REPORT           PATIENT:  [name], [name]  MR#:  [id number]     BIRTHDATE:  02/06/1933, 76 yrs. old  GENDER:  male           ENDOSCOPIST:  Burech Mcfarland. Jarold Motto, MD, [facility]     Referred by:           PROCEDURE DATE:  08/16/2010     PROCEDURE:  EGD with biopsy, Elease Hashimoto Dilation of Esophagus     ASA CLASS:  Class III     INDICATIONS:  dysphagia, dyspepsia, diarrhea           MEDICATIONS:   Fentanyl 50 mcg IV, Versed 5 mg IV     TOPICAL ANESTHETIC:  Exactacain Spray           DESCRIPTION OF PROCEDURE:   After the risks benefits and     alternatives of the procedure were thoroughly explained, informed     consent was obtained.  The [facility] [id number] endoscope was     introduced through the mouth and advanced to the second portion of     the duodenum, without limitations.  The instrument was slowly     withdrawn as the mucosa was fully examined.     <<PROCEDUREIMAGES>>           The upper, middle, and distal third of the esophagus were     carefully inspected and no abnormalities were noted. The z-line     was well seen at the GEJ. The endoscope was pushed into the fundus     which was normal including a retroflexed view. The antrum,gastric     body, first and second part of the duodenum were unremarkable.     DUODENAL BIOPSIES DONE AND DILATED #54 F MALONEY DILATOR.     Retroflexed views revealed no abnormalities.    The scope was then     withdrawn from the patient and the procedure completed.           COMPLICATIONS:  None           ENDOSCOPIC IMPRESSION:     1) Normal EGD     1.NONSPECIFIC PHARYNGEAL NEUROMUSCULAR DYSPHAGIA.     2.CHRONIC IBS.R/O CELIAC DISEASE.     RECOMMENDATIONS:     1) Await  biopsy results     2) continue current medications           REPEAT EXAM:  No           ______________________________     Vania Rea. Jarold Motto, MD, Clementeen Graham           CC:  Lindley Magnus, MD           n.     Rosalie DoctorVania Rea. Patterson at 08/16/2010 11:47 AM           Hartner, Onalee Hua, [id number]  Note: An exclamation mark (!) indicates a result that was not dispersed into the flowsheet. Document Creation Date: 08/16/2010 11:47 AM _______________________________________________________________________  (1) Order result status: Final Collection or observation date-time: 08/16/2010  11:37 Requested date-time:  Receipt date-time:  Reported date-time:  Referring Physician:   Ordering Physician: Sheryn Bison [phone]) Specimen Source:  Source: Launa Grill Order Number: [id number] Lab site:

## 2011-01-11 NOTE — Assessment & Plan Note (Signed)
Summary: swollen abd/dm   Vital Signs:  Patient profile:   77 year old male Weight:      197 pounds Temp:     98.3 degrees F oral Pulse rate:   94 / minute Pulse rhythm:   regular BP sitting:   104 / 72  (left arm) Cuff size:   regular  Vitals Entered By: Alfred Levins, CMA (December 15, 2010 11:20 AM) CC: urinary incontinence and freq, abd swollen   Primary Care Provider:  [name], MD  CC:  urinary incontinence and freq and abd swollen.  History of Present Illness: pt complains of fecal urgency- has had episodes of near incontinence no fever or chills BMs have had diarrhea  these sxs have been ongoing for months. His typical BM is loose ("like pellets")  All other systems reviewed and were negative   Current Medications (verified): 1)  Warfarin Sodium 5 Mg Tabs (Warfarin Sodium) .... Qd As Directed Except 2.5 Mg T-Th 2)  Caltrate 600+d 600-400 Mg-Unit  Tabs (Calcium Carbonate-Vitamin D) .... One Tab Two Times A Day 3)  Aspirin 81 Mg  Tbec (Aspirin) .... Once Daily 4)  Zyrtec Allergy 10 Mg Tabs (Cetirizine Hcl) .... Once Daily As Needed 5)  Fish Oil 1200 Mg Caps (Omega-3 Fatty Acids) .... Take 1 Tablet By Mouth Once A Day 6)  Veramyst 27.5 Mcg/spray Susp (Fluticasone Furoate) .... As Needed 7)  Omeprazole 20 Mg Tbec (Omeprazole) .... Take One Prior To Breakfast Every Other Day 8)  Multivitamins   Tabs (Multiple Vitamin) .... One Tablet By Mouth Once Daily 9)  Benefiber  Powd (Wheat Dextrin) .... Once Daily  Allergies (verified): 1)  ! Biaxin 2)  ! Epinephrine 3)  ! Nimodipine (Nimodipine)  Physical Exam  General:   overweight male in no acute distress. HEENT exam atraumatic, normocephalic symmetrical muscles are intact. Neck is supple. Chest clear to auscultation cardiac exam S1-S2 are irregular. Abdominal exam overweight type bowel sounds, soft.   Impression & Recommendations:  Problem # 1:  IRRITABLE BOWEL SYNDROME (ICD-564.1)  I think his symptoms are best   characterized by the diagnosis of irritable bowel syndrome. his symptoms may be exacerbated by medications including the multiple over-the-counter medications that he takes. under discontinue as many medications as possible. See medication list. Side effects discussed. I'll see him back in 2-3 weeks. All this was discussed with the patient and his wife.  Complete Medication List: 1)  Warfarin Sodium 5 Mg Tabs (Warfarin sodium) .... Qd as directed except 2.5 mg t-th 2)  Aspirin 81 Mg Tbec (Aspirin) .... Once daily 3)  Veramyst 27.5 Mcg/spray Susp (Fluticasone furoate) .... As needed  Patient Instructions: 1)  see new medication list 2)  SEE ME 2-3 WEEKS   Orders Added: 1)  Est. Patient Level IV [16109]

## 2011-01-11 NOTE — Assessment & Plan Note (Signed)
Summary: 3 month/dmp  Medications Added [facility]  CAPS (PROBIOTIC PRODUCT) once daily      Allergies Added:   Referring Provider:  n/a Primary Provider:  [name], MD  CC:  f/u 3 mths.  History of Present Illness: Maurice Wood is a delightful 77 year old male with a history of chronic atrial fibrillation, obstructive sleep apnea, with noncompliance with CPAP. 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 Had episode of CP in january 2011 and underwent cath which showed normal coronaries with ef 45% (? artificially low due to AF)  Returns for postcath f/u. Doing well. Back to baseline No CP, SOB or palpitations. No edema, orthopnea or PND. Taking coumadin religiously. No bleeding. INR therpaeutic. Fatigued.   Not exercising routinely despite wife's urgings to walk more.   Current Medications (verified): 1)  Warfarin Sodium 5 Mg Tabs (Warfarin Sodium) .... Qd As Directed Except 2.5 Mg M-W-F 2)  Caltrate 600+d 600-400 Mg-Unit  Tabs (Calcium Carbonate-Vitamin D) .... One Tab Two Times A Day 3)  Aspirin 81 Mg  Tbec (Aspirin) .... Once Daily 4)  Zyrtec Allergy 10 Mg Tabs (Cetirizine Hcl) .... Once Daily As Needed 5)  Fish Oil 1200 Mg Caps (Omega-3 Fatty Acids) .... Take 1 Tablet By Mouth Once A Day 6)  Veramyst 27.5 Mcg/spray Susp (Fluticasone Furoate) .... As Needed 7)  Ditropan Xl 5 Mg Xr24h-Tab (Oxybutynin Chloride) .... Take Two Tablets Once A Day 8)  Omeprazole 40 Mg Cpdr (Omeprazole) .... Take 1 Tab 30 Min Prior To Breakfast Every Day 9)  Multivitamins   Tabs (Multiple Vitamin) .... One Tablet By Mouth Once Daily 10)  Carafate 1 Gm/24ml  Susp (Sucralfate) .Marland Kitchen.. 1 Tbsp 1 6-8 Hrs As Needed 11)  [employer]  Caps (Probiotic Product) .... Once Daily  Allergies (verified): 1)  ! Biaxin 2)  ! Epinephrine 3)  ! Nimodipine (Nimodipine)  Past History:  Past Medical History:  1. Atrial fibrillation      a. Diagnosed in January 2007. Echo January 2007           normal ejection fraction, no significant valvular disease.      b.     Adenosine Cardiolite January 2007 no evidence of ischemia.      c.     A 48-hour Holter monitor in July 2007 showed good rate           control with chronic atrial fibrillation.  2. 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  3. Obstructive sleep apnea noncompliant with CPAP.  4. Cerebral aneurysm followed by Dr. Corliss Skains.  5. History of chest pain.      a.     Cardiac catheterization 1999 which was normal.      b.     Myoview 2007. normal      c.    Cardiac cath Jan 2011. Normal cors EF 45%  6. Hypertension.  7. Obesity.  8. Fatigue.  9. Allergic rhinitis 10. Colonic polyps, hx of 11. Hyperlipidemia 12. Diabetes/Elevated Glucose 13. 3rd nerve palsy 14. Retinal vein occlusion 15. Benign prostatic hypertrophy 16. Irritable Bowel Syndrome  Review of Systems       As per HPI and past medical history; otherwise all systems negative.   Vital Signs:  Patient profile:   77 year old male Height:      70 inches Weight:  189 pounds BMI:     27.22 Pulse rate:   75 / minute BP sitting:   112 / 74  (left arm) Cuff size:   regular  Vitals Entered By: Hardin Negus, RMA (January 11, 2010 1:47 PM)  Physical Exam  General:  Gen: well appearing. no resp difficulty HEENT: normal Neck: supple. no JVD. Carotids 2+ bilat; no bruits. No lymphadenopathy or thryomegaly appreciated. Cor: PMI nondisplaced. Irregular, no murmur. Lungs: clear Abdomen: soft, nontender, nondistended. Good bowel sounds. Extremities: no cyanosis, clubbing, rash, edema Groin site fine no bruit.  Neuro: alert & orientedx3, cranial nerves grossly intact. moves all 4 extremities w/o difficulty. affect pleasant    Impression & Recommendations:  Problem # 1:  CHEST TIGHTNESS-PRESSURE-OTHER ([id number]) Resolved. Cath  looks good. Will check echo to re-assess EF. Encouraged him to get more exercise.   Problem # 2:  ATRIAL FIBRILLATION (ICD-427.31) Assessment: Improved Doing well . Continue coumadin. We discussed the possibility of switching to Pradaxa but given cost we have deferred.   Other Orders: EKG w/ Interpretation (93000) Echocardiogram (Echo)  Patient Instructions: 1)  Your physician has requested that you have an echocardiogram.  Echocardiography is a painless test that uses sound waves to create images of your heart. It provides your doctor with information about the size and shape of your heart and how well your heart's chambers and valves are working.  This procedure takes approximately one hour. There are no restrictions for this procedure. 2)  Follow up in 6 months

## 2011-01-11 NOTE — Letter (Signed)
Summary: [facility]  [facility]   Imported By: Maryln Gottron 09/25/2010 09:32:08  _____________________________________________________________________  External Attachment:    Type:   Image     Comment:   External Document

## 2011-01-11 NOTE — Progress Notes (Signed)
Summary: triage   Phone Note Call from Patient Call back at Home Phone [phone]   Caller: spouse [name] Call For: Maurice Wood Reason for Call: Talk to Nurse Summary of Call: Patient wants to know what to do until his appt 7-28 for bloody diarrhea and rectal and lower back pain. Initial call taken by: Tawni Levy,  June 26, 2010 2:04 PM  Follow-up for Phone Call        Talked with wife.  Pt has cont' to have problems. Saw Dr. Jarold Wood in [place].  Recently has had increase in lower back pain and loose stools.  Began last week with rectal bleeding.  Bright red.   Alot of gurgleing in stomach.  At times wife can hear noises from pt's abd.  Pt concerned.  Appt sch with Dr Maurice Wood for 07/06/10.  Any suggestions until OV or should pt come in this week and see PA? Follow-up by: Ashok Cordia RN,  June 26, 2010 2:52 PM  Additional Follow-up for Phone Call Additional follow up Details #1::        see pa Additional Follow-up by: Mardella Layman MD FACG,  June 26, 2010 3:13 PM    Additional Follow-up for Phone Call Additional follow up Details #2::    Appt sch for 06/27/10 at 8:30 with Maurice Gip, PA.   Wife notified. Follow-up by: Ashok Cordia RN,  June 26, 2010 3:32 PM

## 2011-01-11 NOTE — Letter (Signed)
Summary: [facility]  [facility]   Imported By: Maryln Gottron 12/27/2010 09:39:42  _____________________________________________________________________  External Attachment:    Type:   Image     Comment:   External Document

## 2011-01-11 NOTE — Assessment & Plan Note (Signed)
Summary: F/U Diarrhea, GERD, saw NP    History of Present Illness Visit Type: follow up  Primary GI MD: [name] FACP FAGA Primary Provider: [name], MD Requesting Provider: n/a Chief Complaint: F/u for diarrhea, and GERD saw NP. Pt states that he is doing better and denies any GI complaints  History of Present Illness:   This patient is a 77 year old white male with diverticulosis and chronic bowel irregularity. He currently is doing much better on fiber supplements and has decreased his omeprazole to 20 mg a day for his chronic GERD. He's had extensive GI evaluations which otherwise have been unremarkable. Comprehensive time-consuming chart review was undertaken today. Patient is chronically on warfarin for atrial fibrillation. He is status post cholecystectomy and is up-to-date on his colonoscopy exam.   GI Review of Systems      Denies abdominal pain, acid reflux, belching, bloating, chest pain, dysphagia with liquids, dysphagia with solids, heartburn, loss of appetite, nausea, vomiting, vomiting blood, weight loss, and  weight gain.        Denies anal fissure, black tarry stools, change in bowel habit, constipation, diarrhea, diverticulosis, fecal incontinence, heme positive stool, hemorrhoids, irritable bowel syndrome, jaundice, light color stool, liver problems, rectal bleeding, and  rectal pain.    Current Medications (verified): 1)  Warfarin Sodium 5 Mg Tabs (Warfarin Sodium) .... Qd As Directed Except 2.5 Mg M-W-F 2)  Caltrate 600+d 600-400 Mg-Unit  Tabs (Calcium Carbonate-Vitamin D) .... One Tab Two Times A Day 3)  Aspirin 81 Mg  Tbec (Aspirin) .... Once Daily 4)  Zyrtec Allergy 10 Mg Tabs (Cetirizine Hcl) .... Once Daily As Needed 5)  Fish Oil 1200 Mg Caps (Omega-3 Fatty Acids) .... Take 1 Tablet By Mouth Once A Day 6)  Veramyst 27.5 Mcg/spray Susp (Fluticasone Furoate) .... As Needed 7)  Ditropan Xl 5 Mg Xr24h-Tab (Oxybutynin Chloride) .... Take Two Tablets  Once A Day 8)  Omeprazole 40 Mg Cpdr (Omeprazole) .... Take 1 Tab 30 Min Prior To Breakfast Every Day 9)  Multivitamins   Tabs (Multiple Vitamin) .... One Tablet By Mouth Once Daily  Allergies (verified): 1)  ! Biaxin 2)  ! Epinephrine 3)  ! Nimodipine (Nimodipine)  Past History:  Past medical, surgical, family and social histories (including risk factors) reviewed for relevance to current acute and chronic problems.  Past Medical History: Reviewed history from 11/22/2009 and no changes required.  1. Atrial fibrillation      a. Diagnosed in January 2007. Echo January 2007           normal ejection fraction, no significant valvular disease.      b.     Adenosine Cardiolite January 2007 no evidence of ischemia.      c.     A 48-hour Holter monitor in July 2007 showed good rate           control with chronic atrial fibrillation.  2. 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  3. Obstructive sleep apnea noncompliant with CPAP.  4. Cerebral aneurysm followed by Dr. Corliss Skains.  5. History of chest pain.      a.     Cardiac catheterization 1999 which was normal.      c.     Myoview 2007. normal  6. Hypertension.  7. Obesity.  8. Fatigue.  9. Allergic rhinitis 10. Colonic polyps, hx of 11. Hyperlipidemia 12. Diabetes/Elevated Glucose 13. 3rd nerve palsy 14. Retinal vein occlusion 15.  Benign prostatic hypertrophy 16. Irritable Bowel Syndrome  Past Surgical History: Reviewed history from 07/24/2007 and no changes required. Cholecystectomy Inguinal and umbilical herniorrhaphy T&A  as child Cardiac Cath-1999 Colonoscopy-12/01/2003  Family History: Reviewed history from 11/22/2009 and no changes required. Lung Cancer: Father Family History of Colon Cancer:Maternal grandmother Family History of Diabetes: Son  Social History: Reviewed history from 07/23/2007 and no changes required. Former Smoker Retired Married Alcohol use-yes  Review of  Systems  The patient denies allergy/sinus, anemia, anxiety-new, arthritis/joint pain, back pain, blood in urine, breast changes/lumps, change in vision, confusion, cough, coughing up blood, depression-new, fainting, fatigue, fever, headaches-new, hearing problems, heart murmur, heart rhythm changes, itching, muscle pains/cramps, night sweats, nosebleeds, shortness of breath, skin rash, sleeping problems, sore throat, swelling of feet/legs, swollen lymph glands, thirst - excessive, urination - excessive, urination changes/pain, urine leakage, vision changes, and voice change.    Vital Signs:  Patient profile:   77 year old male Height:      70 inches Weight:      191 pounds BMI:     27.50 BSA:     2.05 Pulse rate:   62 / minute Pulse rhythm:   regular BP sitting:   112 / 68  (left arm) Cuff size:   regular  Vitals Entered By: Ok Anis CMA (January 03, 2010 10:44 AM)  Physical Exam  General:  Well developed, well nourished, no acute distress.healthy appearing.   Head:  Normocephalic and atraumatic. Eyes:  PERRLA, no icterus.exam deferred to patient's ophthalmologist.   Lungs:  Clear throughout to auscultation. Heart:  irregular rhythm:.   Abdomen:  Soft, nontender and nondistended. No masses, hepatosplenomegaly or hernias noted. Normal bowel sounds.No significant distention or abnormal bowel sounds noted. Neurologic:  Alert and  oriented x4;  grossly normal neurologically. Psych:  Alert and cooperative. Normal mood and affect.   Impression & Recommendations:  Problem # 1:  DIVERTICULOSIS OF COLON (ICD-562.10) Assessment Improved Continue high-fiber diet and fiber supplements as tolerated with continued probiotic trial. I see no need for colonoscopy at this time Orders: TLB-B12, Serum-Total ONLY (98119-J47) TLB-Ferritin (82728-FER) TLB-Folic Acid (Folate) (82746-FOL) TLB-IBC Pnl (Iron/FE;Transferrin) (83550-IBC) TLB-CBC Platelet - w/Differential (85025-CBCD) TLB-IgA  (Immunoglobulin A) (82784-IGA) T-Sprue Panel (Celiac Disease Aby Eval) (83516x3/[address])  Problem # 2:  IRRITABLE BOWEL SYNDROME (ICD-564.1) Assessment: Improved  Orders: TLB-B12, Serum-Total ONLY (82956-O13) TLB-Ferritin (82728-FER) TLB-Folic Acid (Folate) (82746-FOL) TLB-IBC Pnl (Iron/FE;Transferrin) (83550-IBC) TLB-CBC Platelet - w/Differential (85025-CBCD) TLB-IgA (Immunoglobulin A) (82784-IGA) T-Sprue Panel (Celiac Disease Aby Eval) (83516x3/[address])  Problem # 3:  GERD (ICD-530.81) Assessment: Improved Continue omeprazole 20 mg a day as tolerated Orders: TLB-B12, Serum-Total ONLY (08657-Q46) TLB-Ferritin (82728-FER) TLB-Folic Acid (Folate) (82746-FOL) TLB-IBC Pnl (Iron/FE;Transferrin) (83550-IBC) TLB-CBC Platelet - w/Differential (85025-CBCD) TLB-IgA (Immunoglobulin A) (82784-IGA) T-Sprue Panel (Celiac Disease Aby Eval) (83516x3/[address])  Problem # 4:  RECTAL BLEEDING (ICD-569.3) Assessment: Improved We'll Check CBC and anemia profile. His rectal bleeding has cleared and seems most consistent with local perianal irritation related to frequent bowel movements. Orders: TLB-B12, Serum-Total ONLY (96295-M84) TLB-Ferritin (82728-FER) TLB-Folic Acid (Folate) (82746-FOL) TLB-IBC Pnl (Iron/FE;Transferrin) (83550-IBC) TLB-CBC Platelet - w/Differential (85025-CBCD) TLB-IgA (Immunoglobulin A) (82784-IGA) T-Sprue Panel (Celiac Disease Aby Eval) (83516x3/[address])  Patient Instructions: 1)  Copy sent to : Dr. [name] 2)  Please continue current medications.  3)  Diet should be high in fiber ( fruits, vegetables, whole grains) but low in residue. Drink at least eight (8) glasses of water a day.  4)  IBS brochure given.  5)  Labs Pending 6)  The medication list was reviewed and reconciled.  All changed / newly prescribed medications were explained.  A complete medication list was provided to the patient / caregiver.  Appended Document: F/U Diarrhea, GERD, saw  NP    Clinical Lists Changes  Medications: Added new medication of CARAFATE 1 GM/10ML  SUSP (SUCRALFATE) 1 Tbsp 1 6-8 hrs as needed - Signed Added new medication of PHILLIPS COLON HEALTH  CAPS (PROBIOTIC PRODUCT) bid Rx of CARAFATE 1 GM/10ML  SUSP (SUCRALFATE) 1 Tbsp 1 6-8 hrs as needed;  #14 oz x 1;  Signed;  Entered by: Ashok Cordia RN;  Authorized by: Mardella Layman MD [facility];  Method used: Electronically to [facility] [id number] [address] [place], [place], [place]  [address], Ph: [phone], Fax: [phone]    Prescriptions: CARAFATE 1 GM/10ML  SUSP (SUCRALFATE) 1 Tbsp 1 6-8 hrs as needed  #14 oz x 1   Entered by:   Ashok Cordia RN   Authorized by:   Mardella Layman MD [facility]   Signed by:   Ashok Cordia RN on 01/03/2010   Method used:   Electronically to        [facility] [address] (retail)       [address] [place], [place]  [address]       Ph: [phone]       Fax: [phone]   RxID:   [id number]

## 2011-01-11 NOTE — Assessment & Plan Note (Signed)
Summary: 4 month follow up/cjr/PT RESCD FROM BUMP//CCM   Vital Signs:  Patient profile:   77 year old male Weight:      192 pounds Temp:     98.4 degrees F oral Pulse rate:   80 / minute Pulse rhythm:   regular Resp:     12 per minute BP sitting:   100 / 72  (left arm) Cuff size:   regular  Vitals Entered By: Gladis Riffle, RN (July 31, 2010 8:28 AM) CC: 4 month rov, labs done Is Patient Diabetic? No   Primary Care Melinda Gwinner:  [name], MD  CC:  4 month rov and labs done.  History of Present Illness:  Follow-Up Visit      This is a 77 year old man who presents for Follow-up visit.  The patient denies chest pain and palpitations.  Since the last visit the patient notes no new problems or concerns.  The patient reports taking meds as prescribed, dietary noncompliance, and not exercising.  When questioned about possible medication side effects, the patient notes none.    All other systems reviewed and were negative   Preventive Screening-Counseling & Management  Alcohol-Tobacco     Smoking Status: quit > 6 months     Year Started: 1952     Year Quit: 1973  Current Problems (verified): 1)  Encounter For Therapeutic Drug Monitoring  (ICD-V58.83) 2)  Hypertension  (ICD-401.9) 3)  Sleep Apnea  (ICD-780.57) 4)  Hemorrhoids-internal  (ICD-455.0) 5)  Coronary Artery Disease  (ICD-414.00) 6)  Diverticulosis of Colon  (ICD-562.10) 7)  Irritable Bowel Syndrome  (ICD-564.1) 8)  Gerd  (ICD-530.81) 9)  Erectile Dysfunction  (ICD-302.72) 10)  Hypercholesterolemia  (ICD-272.0) 11)  Benign Prostatic Hypertrophy  (ICD-600.00) 12)  Coumadin Therapy  (ICD-V58.61) 13)  Psa, Increased  (ICD-790.93) 14)  Anemia  (ICD-285.9) 15)  Diabetes Mellitus, Type II  (ICD-250.00) 16)  Dissection, Carotid Artery  (ICD-443.21) 17)  Obstructive Sleep Apnea  (ICD-327.23) 18)  Testosterone Deficiency  (ICD-257.2) 19)  Hyperlipidemia  (ICD-272.4) 20)  Colonic Polyps, Hx of  (ICD-V12.72) 21)  Atrial  Fibrillation  (ICD-427.31)  Current Medications (verified): 1)  Warfarin Sodium 5 Mg Tabs (Warfarin Sodium) .... Qd As Directed Except 2.5 Mg T-Th 2)  Caltrate 600+d 600-400 Mg-Unit  Tabs (Calcium Carbonate-Vitamin D) .... One Tab Two Times A Day 3)  Aspirin 81 Mg  Tbec (Aspirin) .... Once Daily 4)  Zyrtec Allergy 10 Mg Tabs (Cetirizine Hcl) .... Once Daily As Needed 5)  Fish Oil 1200 Mg Caps (Omega-3 Fatty Acids) .... Take 1 Tablet By Mouth Once A Day 6)  Veramyst 27.5 Mcg/spray Susp (Fluticasone Furoate) .... As Needed 7)  Ditropan Xl 5 Mg Xr24h-Tab (Oxybutynin Chloride) .... Take One Tablet Once A Day 8)  Omeprazole 20 Mg Tbec (Omeprazole) .... Take One Prior To Breakfast Every Other Day 9)  Multivitamins   Tabs (Multiple Vitamin) .... One Tablet By Mouth Once Daily 10)  Mag-Ox 400 400 Mg Tabs (Magnesium Oxide) .... Once Daily  Allergies: 1)  ! Biaxin 2)  ! Epinephrine 3)  ! Nimodipine (Nimodipine)  Past History:  Past Medical History: Last updated: 07/24/2010  1. Atrial fibrillation      a. Diagnosed in January 2007. Echo January 2007           normal ejection fraction, no significant valvular disease.      b.     Adenosine Cardiolite January 2007 no evidence of ischemia.      c.  A 48-hour Holter monitor in July 2007 showed good rate           control with chronic atrial fibrillation.      d. Cath 1/11: Normal cors. EF 45%      e. Echo  2/11: 60-65%  2. 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  3. Obstructive sleep apnea noncompliant with CPAP.  4. Cerebral aneurysm followed by Dr. Corliss Skains.  5. History of chest pain.      a.     Cardiac catheterization 1999 which was normal.      b.     Myoview 2007. normal      c.    Cardiac cath Jan 2011. Normal cors EF 45%  6. Hypertension.  7. Obesity.  8. Fatigue.  9. Allergic rhinitis 10. Colonic polyps, hx of,diverticulosis-last colon 2009 11. Hyperlipidemia 12. Diabetes/Elevated  Glucose 13. 3rd nerve palsy 14. Retinal vein occlusion 15. Benign prostatic hypertrophy 16. Irritable Bowel Syndrome  Past Surgical History: Last updated: 06/27/2010 Cholecystectomy Inguinal and umbilical herniorrhaphy T&A  as child Cardiac Cath-1999 Colonoscopy-12/01/2003,4/09  Family History: Last updated: 11/22/2009 Lung Cancer: Father Family History of Colon Cancer:Maternal grandmother Family History of Diabetes: Son  Social History: Last updated: 07/23/2007 Former Smoker Retired Married Alcohol use-yes  Risk Factors: Smoking Status: quit > 6 months (07/31/2010)  Physical Exam  General:  alert and well-developed.   Head:  normocephalic and atraumatic.   Eyes:  pupils equal and pupils round.   Ears:  R ear normal and L ear normal.   Neck:  No deformities, masses, or tenderness noted. Lungs:  normal respiratory effort and no intercostal retractions.   Heart:  irregular rhythm rate controlled 2/6 HSM Abdomen:  normal bowel sounds. Soft and nontender. No masses. No guarding or rebound. Skin:  turgor normal and color normal.  few echymosis   Impression & Recommendations:  Problem # 1:  HYPERTENSION (ICD-401.9) controlled continue current medications  BP today: 100/72 Prior BP: 92/64 (07/24/2010)  Labs Reviewed: K+: 5.0 (07/20/2010) Creat: : 1.2 (07/20/2010)   Chol: 194 (07/20/2010)   HDL: 48.70 (07/20/2010)   LDL: 86 (11/29/2009)   TG: 240.0 (07/20/2010)  Problem # 2:  HYPERCHOLESTEROLEMIA (ICD-272.0) not on any meds no need for Rx Labs Reviewed: SGOT: 21 (07/20/2010)   SGPT: 13 (07/20/2010)   HDL:48.70 (07/20/2010), 54.30 (03/21/2010)  LDL:86 (11/29/2009), DEL (16/09/9603)  Chol:194 (07/20/2010), 194 (03/21/2010)  Trig:240.0 (07/20/2010), 242.0 (03/21/2010)  Problem # 3:  DIABETES MELLITUS, TYPE II (ICD-250.00) controlled continue current medications  His updated medication list for this problem includes:    Aspirin 81 Mg Tbec (Aspirin) ..... Once  daily  Labs Reviewed: Creat: 1.2 (07/20/2010)     Last Eye Exam: normal-no retinopathy (05/10/2009) Reviewed HgBA1c results: 5.9 (07/20/2010)  5.9 (03/21/2010)  Problem # 5:  ATRIAL FIBRILLATION (ICD-427.31)  clinically in SR today he thinks he has had protime since 7/25---will review "pink sheet" His updated medication list for this problem includes:    Warfarin Sodium 5 Mg Tabs (Warfarin sodium) ..... Qd as directed except 2.5 mg t-th    Aspirin 81 Mg Tbec (Aspirin) ..... Once daily  Reviewed the following: PT: 23.6 (04/12/2010)   INR: 2.4 (07/03/2010) Next Protime: 4 weeks (dated on 07/03/2010)  Complete Medication List: 1)  Warfarin Sodium 5 Mg Tabs (Warfarin sodium) .... Qd as directed except 2.5 mg t-th 2)  Caltrate 600+d 600-400 Mg-unit Tabs (Calcium carbonate-vitamin d) .... One tab two times a  day 3)  Aspirin 81 Mg Tbec (Aspirin) .... Once daily 4)  Zyrtec Allergy 10 Mg Tabs (Cetirizine hcl) .... Once daily as needed 5)  Fish Oil 1200 Mg Caps (Omega-3 fatty acids) .... Take 1 tablet by mouth once a day 6)  Veramyst 27.5 Mcg/spray Susp (Fluticasone furoate) .... As needed 7)  Ditropan Xl 5 Mg Xr24h-tab (Oxybutynin chloride) .... Take one tablet once a day 8)  Omeprazole 20 Mg Tbec (Omeprazole) .... Take one prior to breakfast every other day 9)  Multivitamins Tabs (Multiple vitamin) .... One tablet by mouth once daily 10)  Mag-ox 400 400 Mg Tabs (Magnesium oxide) .... Once daily  Patient Instructions: 1)  Please schedule a follow-up appointment in 6 months. 2)  labs one week prior to visit 3)  lipids---272.4 4)  lfts-995.2 5)  bmet-995.2 6)  A1C-250.02 7)      Appended Document: 4 month follow up/cjr/PT RESCD FROM BUMP//CCM   ANTICOAGULATION RECORD PREVIOUS REGIMEN & LAB RESULTS Anticoagulation Diagnosis:  v58.83,v58.61,427.31 on  07/14/2007 Previous INR Goal Range:  2.0-3.0 on  05/12/2008 Previous INR:  2.4 on  07/03/2010 Previous Coumadin Dose(mg):  2.5  Tues,Thurs others 5mg  on  09/15/2009 Previous Regimen:  same on  06/13/2010 Previous Coagulation Comments:  Dr. Lovell Sheehan approved on  04/12/2010  NEW REGIMEN & LAB RESULTS Current INR: 2.2 Regimen: same  Repeat testing in: 4 weeks  Anticoagulation Visit Questionnaire Coumadin dose missed/changed:  No Abnormal Bleeding Symptoms:  No  Any diet changes including alcohol intake, vegetables or greens since the last visit:  No Any illnesses or hospitalizations since the last visit:  No Any signs of clotting since the last visit (including chest discomfort, dizziness, shortness of breath, arm tingling, slurred speech, swelling or redness in leg):  No  MEDICATIONS WARFARIN SODIUM 5 MG TABS (WARFARIN SODIUM) qd as directed except 2.5 mg T-TH CALTRATE 600+D 600-400 MG-UNIT  TABS (CALCIUM CARBONATE-VITAMIN D) one tab two times a day ASPIRIN 81 MG  TBEC (ASPIRIN) once daily ZYRTEC ALLERGY 10 MG TABS (CETIRIZINE HCL) once daily as needed FISH OIL 1200 MG CAPS (OMEGA-3 FATTY ACIDS) Take 1 tablet by mouth once a day VERAMYST 27.5 MCG/SPRAY SUSP (FLUTICASONE FUROATE) as needed DITROPAN XL 5 MG XR24H-TAB (OXYBUTYNIN CHLORIDE) Take one tablet once a day OMEPRAZOLE 20 MG TBEC (OMEPRAZOLE) Take one prior to breakfast every other day MULTIVITAMINS   TABS (MULTIPLE VITAMIN) one tablet by mouth once daily MAG-OX 400 400 MG TABS (MAGNESIUM OXIDE) once daily    Laboratory Results   Blood Tests      INR: 2.2   (Normal Range: 0.88-1.12   Therap INR: 2.0-3.5) Comments: [name]  July 31, 2010 9:16 AM       Appended Document: 4 month follow up/cjr/PT RESCD FROM BUMP//CCM   Immunizations Administered:  Tetanus Vaccine:    Vaccine Type: Tdap    Site: left deltoid    Mfr: Gl[employer]oSmithKline    Dose: 0.5 ml    Route: IM    Given by: Gladis Riffle, RN    Exp. Date: 09/28/2012    Lot #: [id number]    VIS given: 10/28/07 version given July 31, 2010.   Appended Document: 4 month follow  up/cjr/PT RESCD FROM BUMP//CCM refer to GI for endoscopy---pill dysphagia last EGD 2002  Appended Document: 4 month follow up/cjr/PT RESCD FROM BUMP//CCM Left message on machine. Pt to call back.   Appended Document: 4 month follow up/cjr/PT RESCD FROM BUMP//CCM Patient notified. Will await when and where of appt.  Order in process.

## 2011-01-11 NOTE — Assessment & Plan Note (Signed)
Summary: pt/njr-----PT [name] // RS   Nurse Visit   Allergies: 1)  ! Biaxin 2)  ! Epinephrine 3)  ! Nimodipine (Nimodipine) Laboratory Results   Blood Tests     PT: 18.2 s   (Normal Range: 10.6-13.4)  INR: 2.2   (Normal Range: 0.88-1.12   Therap INR: 2.0-3.5) Comments: Maurice Wood  February 03, 2010 2:10 PM     Orders Added: 1)  Est. Patient Level I [99211] 2)  Protime [[id number]]   ANTICOAGULATION RECORD PREVIOUS REGIMEN & LAB RESULTS Anticoagulation Diagnosis:  v58.83,v58.61,427.31 on  07/14/2007 Previous INR Goal Range:  2.0-3.0 on  05/12/2008 Previous INR:  2.1 on  01/06/2010 Previous Coumadin Dose(mg):  2.5 Tues,Thurs others 5mg  on  09/15/2009 Previous Regimen:  same on  01/06/2010 Previous Coagulation Comments:  By.K on  09/24/2008  NEW REGIMEN & LAB RESULTS Current INR: 2.2 Regimen: same  Repeat testing in: 1 month  Anticoagulation Visit Questionnaire Coumadin dose missed/changed:  No Abnormal Bleeding Symptoms:  No  Any diet changes including alcohol intake, vegetables or greens since the last visit:  No Any illnesses or hospitalizations since the last visit:  No Any signs of clotting since the last visit (including chest discomfort, dizziness, shortness of breath, arm tingling, slurred speech, swelling or redness in leg):  No  MEDICATIONS WARFARIN SODIUM 5 MG TABS (WARFARIN SODIUM) qd as directed except 2.5 mg M-W-F CALTRATE 600+D 600-400 MG-UNIT  TABS (CALCIUM CARBONATE-VITAMIN D) one tab two times a day ASPIRIN 81 MG  TBEC (ASPIRIN) once daily ZYRTEC ALLERGY 10 MG TABS (CETIRIZINE HCL) once daily as needed FISH OIL 1200 MG CAPS (OMEGA-3 FATTY ACIDS) Take 1 tablet by mouth once a day VERAMYST 27.5 MCG/SPRAY SUSP (FLUTICASONE FUROATE) as needed DITROPAN XL 5 MG XR24H-TAB (OXYBUTYNIN CHLORIDE) Take two tablets once a day OMEPRAZOLE 40 MG CPDR (OMEPRAZOLE) Take 1 tab 30 min prior to breakfast every day MULTIVITAMINS   TABS (MULTIPLE VITAMIN) one tablet  by mouth once daily CARAFATE 1 GM/10ML  SUSP (SUCRALFATE) 1 Tbsp 1 6-8 hrs as needed PHILLIPS COLON HEALTH  CAPS (PROBIOTIC PRODUCT) once daily

## 2011-01-11 NOTE — Assessment & Plan Note (Signed)
Summary: pt//ccm   Nurse Visit   Allergies: 1)  ! Biaxin 2)  ! Epinephrine 3)  ! Nimodipine (Nimodipine) Laboratory Results   Blood Tests   Date/Time Received: October 30, 2010 11:37 AM  Date/Time Reported: October 30, 2010 11:37 AM    INR: 2.4   (Normal Range: 0.88-1.12   Therap INR: 2.0-3.5) Comments: Wynona Canes, CMA  October 30, 2010 11:37 AM     Orders Added: 1)  Est. Patient Level I [99211] 2)  Protime [78469GE]  Laboratory Results   Blood Tests      INR: 2.4   (Normal Range: 0.88-1.12   Therap INR: 2.0-3.5) Comments: Wynona Canes, CMA  October 30, 2010 11:37 AM       ANTICOAGULATION RECORD PREVIOUS REGIMEN & LAB RESULTS Anticoagulation Diagnosis:  v58.83,v58.61,427.31 on  07/14/2007 Previous INR Goal Range:  2.0-3.0 on  05/12/2008 Previous INR:  2.6 on  10/02/2010 Previous Coumadin Dose(mg):  2.5 Tues,Thurs others 5mg  on  09/15/2009 Previous Regimen:  same on  10/02/2010 Previous Coagulation Comments:  Dr. Lovell Sheehan approved on  04/12/2010  NEW REGIMEN & LAB RESULTS Current INR: 2.4 Regimen: same  (no change)       Repeat testing in: 4 weeks MEDICATIONS WARFARIN SODIUM 5 MG TABS (WARFARIN SODIUM) qd as directed except 2.5 mg T-TH CALTRATE 600+D 600-400 MG-UNIT  TABS (CALCIUM CARBONATE-VITAMIN D) one tab two times a day ASPIRIN 81 MG  TBEC (ASPIRIN) once daily ZYRTEC ALLERGY 10 MG TABS (CETIRIZINE HCL) once daily as needed FISH OIL 1200 MG CAPS (OMEGA-3 FATTY ACIDS) Take 1 tablet by mouth once a day VERAMYST 27.5 MCG/SPRAY SUSP (FLUTICASONE FUROATE) as needed DITROPAN XL 5 MG XR24H-TAB (OXYBUTYNIN CHLORIDE) Take one tablet once a day OMEPRAZOLE 20 MG TBEC (OMEPRAZOLE) Take one prior to breakfast every other day MULTIVITAMINS   TABS (MULTIPLE VITAMIN) one tablet by mouth once daily MAG-OX 400 400 MG TABS (MAGNESIUM OXIDE) once daily ALIGN  CAPS (PROBIOTIC PRODUCT) once daily BENEFIBER  POWD (WHEAT DEXTRIN) once  daily   Anticoagulation Visit Questionnaire      Coumadin dose missed/changed:  No      Abnormal Bleeding Symptoms:  No   Any diet changes including alcohol intake, vegetables or greens since the last visit:  No Any illnesses or hospitalizations since the last visit:  No Any signs of clotting since the last visit (including chest discomfort, dizziness, shortness of breath, arm tingling, slurred speech, swelling or redness in leg):  No

## 2011-01-11 NOTE — Progress Notes (Signed)
Summary: Patient Has Received Dismissal Letter  Returned receipt received verifing delivery of letter. Vara Guardian  January 05, 2011 12:36 PM

## 2011-01-11 NOTE — Assessment & Plan Note (Signed)
Summary: pt/njr   Nurse Visit   Allergies: 1)  ! Biaxin 2)  ! Epinephrine 3)  ! Nimodipine (Nimodipine) Laboratory Results   Blood Tests      INR: 2.5   (Normal Range: 0.88-1.12   Therap INR: 2.0-3.5) Comments: [name]  November 27, 2010 11:03 AM     Orders Added: 1)  Est. Patient Level I [99211] 2)  Protime [16109UE]   ANTICOAGULATION RECORD PREVIOUS REGIMEN & LAB RESULTS Anticoagulation Diagnosis:  v58.83,v58.61,427.31 on  07/14/2007 Previous INR Goal Range:  2.0-3.0 on  05/12/2008 Previous INR:  2.4 on  10/30/2010 Previous Coumadin Dose(mg):  2.5 Tues,Thurs others 5mg  on  09/15/2009 Previous Regimen:  same on  10/02/2010 Previous Coagulation Comments:  Dr. Lovell Sheehan approved on  04/12/2010  NEW REGIMEN & LAB RESULTS Current INR: 2.5 Regimen: same  Repeat testing in: 4 weeks  Anticoagulation Visit Questionnaire Coumadin dose missed/changed:  No Abnormal Bleeding Symptoms:  No  Any diet changes including alcohol intake, vegetables or greens since the last visit:  No Any illnesses or hospitalizations since the last visit:  No Any signs of clotting since the last visit (including chest discomfort, dizziness, shortness of breath, arm tingling, slurred speech, swelling or redness in leg):  No  MEDICATIONS WARFARIN SODIUM 5 MG TABS (WARFARIN SODIUM) qd as directed except 2.5 mg T-TH CALTRATE 600+D 600-400 MG-UNIT  TABS (CALCIUM CARBONATE-VITAMIN D) one tab two times a day ASPIRIN 81 MG  TBEC (ASPIRIN) once daily ZYRTEC ALLERGY 10 MG TABS (CETIRIZINE HCL) once daily as needed FISH OIL 1200 MG CAPS (OMEGA-3 FATTY ACIDS) Take 1 tablet by mouth once a day VERAMYST 27.5 MCG/SPRAY SUSP (FLUTICASONE FUROATE) as needed DITROPAN XL 5 MG XR24H-TAB (OXYBUTYNIN CHLORIDE) Take one tablet once a day OMEPRAZOLE 20 MG TBEC (OMEPRAZOLE) Take one prior to breakfast every other day MULTIVITAMINS   TABS (MULTIPLE VITAMIN) one tablet by mouth once daily MAG-OX 400 400 MG TABS  (MAGNESIUM OXIDE) once daily ALIGN  CAPS (PROBIOTIC PRODUCT) once daily BENEFIBER  POWD (WHEAT DEXTRIN) once daily

## 2011-01-11 NOTE — Assessment & Plan Note (Signed)
Summary: pt/njr   Nurse Visit   Allergies: 1)  ! Biaxin 2)  ! Epinephrine 3)  ! Nimodipine (Nimodipine) Laboratory Results   Blood Tests     PT: 16.7 s   (Normal Range: 10.6-13.4)  INR: 1.8   (Normal Range: 0.88-1.12   Therap INR: 2.0-3.5) Comments: [name]  December 30, 2009 12:04 PM     Orders Added: 1)  Est. Patient Level I [99211] 2)  Protime [16109UE]    ANTICOAGULATION RECORD PREVIOUS REGIMEN & LAB RESULTS Anticoagulation Diagnosis:  v58.83,v58.61,427.31 on  07/14/2007 Previous INR Goal Range:  2.0-3.0 on  05/12/2008 Previous INR:  1.3 on  12/23/2009 Previous Coumadin Dose(mg):  2.5 Tues,Thurs others 5mg  on  09/15/2009 Previous Regimen:  same on  12/23/2009 Previous Coagulation Comments:  By.K on  09/24/2008  NEW REGIMEN & LAB RESULTS Current INR: 1.8 Regimen: same  Repeat testing in: 1 week  Anticoagulation Visit Questionnaire Coumadin dose missed/changed:  No Abnormal Bleeding Symptoms:  No  Any diet changes including alcohol intake, vegetables or greens since the last visit:  No Any illnesses or hospitalizations since the last visit:  No Any signs of clotting since the last visit (including chest discomfort, dizziness, shortness of breath, arm tingling, slurred speech, swelling or redness in leg):  Yes  MEDICATIONS WARFARIN SODIUM 5 MG TABS (WARFARIN SODIUM) qd as directed except 2.5 mg M-W-F CALTRATE 600+D 600-400 MG-UNIT  TABS (CALCIUM CARBONATE-VITAMIN D) one tab two times a day ASPIRIN 81 MG  TBEC (ASPIRIN) once daily ZYRTEC ALLERGY 10 MG TABS (CETIRIZINE HCL) once daily as needed FISH OIL 1200 MG CAPS (OMEGA-3 FATTY ACIDS) Take 1 tablet by mouth once a day VERAMYST 27.5 MCG/SPRAY SUSP (FLUTICASONE FUROATE) as needed DITROPAN XL 5 MG XR24H-TAB (OXYBUTYNIN CHLORIDE) Take two tablets once a day OMEPRAZOLE 40 MG CPDR (OMEPRAZOLE) Take 1 tab 30 min prior to breakfast every day

## 2011-01-11 NOTE — Letter (Signed)
Summary: [facility]  [facility]   Imported By: Maryln Gottron 10/20/2010 09:30:32  _____________________________________________________________________  External Attachment:    Type:   Image     Comment:   External Document

## 2011-01-11 NOTE — Letter (Signed)
Summary: [facility]  [facility]   Imported By: Maryln Gottron 05/24/2010 14:51:44  _____________________________________________________________________  External Attachment:    Type:   Image     Comment:   External Document

## 2011-01-11 NOTE — Letter (Signed)
Summary: Patient Notice-Endo Biopsy Results  [facility]  [address] [place], [place] [address]   Phone: [phone]  Fax: [phone]        August 21, 2010 MRN: [id number]    Maurice Wood [address] [place], [place]  [address]    Dear [name],  I am pleased to inform you that the biopsies taken during your recent endoscopic examination did not show any evidence of cancer upon pathologic examination.  Additional information/recommendations:  __No further action is needed at this time.  Please follow-up with      your primary care physician for your other healthcare needs.  __ Please call [phone] to schedule a return visit to review      your condition.  _x_ Continue with the treatment plan as outlined on the day of your      exam.  __ You should have a repeat endoscopic examination for this problem              in _ months/years.   Please call us if you are having persistent problems or have questions about your condition that have not been fully answered at this time.  Sincerely,  Mardella Layman MD [facility]  This letter has been electronically signed by your physician.  Appended Document: Patient Notice-Endo Biopsy Results letter mailed

## 2011-01-11 NOTE — Assessment & Plan Note (Signed)
Summary: protime//ccm   Nurse Visit   Allergies: 1)  ! Biaxin 2)  ! Epinephrine 3)  ! Nimodipine (Nimodipine) Laboratory Results   Blood Tests     PT: 19.0 s   (Normal Range: 10.6-13.4)  INR: 2.4   (Normal Range: 0.88-1.12   Therap INR: 2.0-3.5) Comments: Wynona Canes, CMA  March 02, 2010 10:30 AM     Orders Added: 1)  Est. Patient Level I [99211] 2)  Protime [16109UE]  Laboratory Results   Blood Tests     PT: 19.0 s   (Normal Range: 10.6-13.4)  INR: 2.4   (Normal Range: 0.88-1.12   Therap INR: 2.0-3.5) Comments: Wynona Canes, CMA  March 02, 2010 10:30 AM       ANTICOAGULATION RECORD PREVIOUS REGIMEN & LAB RESULTS Anticoagulation Diagnosis:  v58.83,v58.61,427.31 on  07/14/2007 Previous INR Goal Range:  2.0-3.0 on  05/12/2008 Previous INR:  2.2 on  02/03/2010 Previous Coumadin Dose(mg):  2.5 Tues,Thurs others 5mg  on  09/15/2009 Previous Regimen:  same on  02/03/2010 Previous Coagulation Comments:  By.K on  09/24/2008  NEW REGIMEN & LAB RESULTS Current INR: 2.4 Regimen: same       Repeat testing in: 1 month MEDICATIONS WARFARIN SODIUM 5 MG TABS (WARFARIN SODIUM) qd as directed except 2.5 mg M-W-F CALTRATE 600+D 600-400 MG-UNIT  TABS (CALCIUM CARBONATE-VITAMIN D) one tab two times a day ASPIRIN 81 MG  TBEC (ASPIRIN) once daily ZYRTEC ALLERGY 10 MG TABS (CETIRIZINE HCL) once daily as needed FISH OIL 1200 MG CAPS (OMEGA-3 FATTY ACIDS) Take 1 tablet by mouth once a day VERAMYST 27.5 MCG/SPRAY SUSP (FLUTICASONE FUROATE) as needed DITROPAN XL 5 MG XR24H-TAB (OXYBUTYNIN CHLORIDE) Take two tablets once a day OMEPRAZOLE 40 MG CPDR (OMEPRAZOLE) Take 1 tab 30 min prior to breakfast every day MULTIVITAMINS   TABS (MULTIPLE VITAMIN) one tablet by mouth once daily CARAFATE 1 GM/10ML  SUSP (SUCRALFATE) 1 Tbsp 1 6-8 hrs as needed PHILLIPS COLON HEALTH  CAPS (PROBIOTIC PRODUCT) once daily   Anticoagulation Visit Questionnaire      Coumadin dose  missed/changed:  No      Abnormal Bleeding Symptoms:  No   Any diet changes including alcohol intake, vegetables or greens since the last visit:  No Any illnesses or hospitalizations since the last visit:  No Any signs of clotting since the last visit (including chest discomfort, dizziness, shortness of breath, arm tingling, slurred speech, swelling or redness in leg):  Yes

## 2011-01-11 NOTE — Letter (Signed)
Summary: EGD Instructions  [facility]  [address] [place], [place] [address]   Phone: [phone]  Fax: [phone]       Maurice Wood    02/13/1933    MRN: [id number]       Procedure Day Dorna Bloom: Wednesday, 08/16/10     Arrival Time:  10:00     Procedure Time: 11:00     Location of Procedure:                    _X  _ [facility] (4th Floor)     PREPARATION FOR ENDOSCOPY   On 08/16/10 THE DAY OF THE PROCEDURE:  1.   No solid foods, milk or milk products are allowed after midnight the night before your procedure.  2.   Do not drink anything colored red or purple.  Avoid juices with pulp.  No orange juice.  3.  You may drink clear liquids until 9:00am, which is 2 hours before your procedure.                                                                                                CLEAR LIQUIDS INCLUDE: Water Jello Ice Popsicles Tea (sugar ok, no milk/cream) Powdered fruit flavored drinks Coffee (sugar ok, no milk/cream) Gatorade Juice: apple, white grape, white cranberry  Lemonade Clear bullion, consomm, broth Carbonated beverages (any kind) Strained chicken noodle soup Hard Candy   MEDICATION INSTRUCTIONS  Unless otherwise instructed, you should take regular prescription medications with a small sip of water as early as possible the morning of your procedure.       You may stay on your aspirin and coumadin.                OTHER INSTRUCTIONS  You will need a responsible adult at least 75 years of age to accompany you and drive you home.   This person must remain in the waiting room during your procedure.  Wear loose fitting clothing that is easily removed.  Leave jewelry and other valuables at home.  However, you may wish to bring a book to read or an iPod/MP3 player to listen to music as you wait for your procedure to start.  Remove all body piercing jewelry and leave at home.  Total time from sign-in until  discharge is approximately 2-3 hours.  You should go home directly after your procedure and rest.  You can resume normal activities the day after your procedure.  The day of your procedure you should not:   Drive   Make legal decisions   Operate machinery   Drink alcohol   Return to work  You will receive specific instructions about eating, activities and medications before you leave.    The above instructions have been reviewed and explained to me by   _______________________    I fully understand and can verbalize these instructions _____________________________ Date _________

## 2011-01-11 NOTE — Assessment & Plan Note (Signed)
Summary: pt//ccm   Nurse Visit   Allergies: 1)  ! Biaxin 2)  ! Epinephrine 3)  ! Nimodipine (Nimodipine) Laboratory Results   Blood Tests      INR: 2.6   (Normal Range: 0.88-1.12   Therap INR: 2.0-3.5) Comments: [name]  October 02, 2010 3:46 PM     Orders Added: 1)  Est. Patient Level I [99211] 2)  Protime [16109UE]   ANTICOAGULATION RECORD PREVIOUS REGIMEN & LAB RESULTS Anticoagulation Diagnosis:  v58.83,v58.61,427.31 on  07/14/2007 Previous INR Goal Range:  2.0-3.0 on  05/12/2008 Previous INR:  2.1 on  08/28/2010 Previous Coumadin Dose(mg):  2.5 Tues,Thurs others 5mg  on  09/15/2009 Previous Regimen:  same on  07/31/2010 Previous Coagulation Comments:  Dr. Lovell Sheehan approved on  04/12/2010  NEW REGIMEN & LAB RESULTS Current INR: 2.6 Regimen: same  Repeat testing in: 4 weeks  Anticoagulation Visit Questionnaire Coumadin dose missed/changed:  No Abnormal Bleeding Symptoms:  No  Any diet changes including alcohol intake, vegetables or greens since the last visit:  No Any illnesses or hospitalizations since the last visit:  No Any signs of clotting since the last visit (including chest discomfort, dizziness, shortness of breath, arm tingling, slurred speech, swelling or redness in leg):  Yes  MEDICATIONS WARFARIN SODIUM 5 MG TABS (WARFARIN SODIUM) qd as directed except 2.5 mg T-TH CALTRATE 600+D 600-400 MG-UNIT  TABS (CALCIUM CARBONATE-VITAMIN D) one tab two times a day ASPIRIN 81 MG  TBEC (ASPIRIN) once daily ZYRTEC ALLERGY 10 MG TABS (CETIRIZINE HCL) once daily as needed FISH OIL 1200 MG CAPS (OMEGA-3 FATTY ACIDS) Take 1 tablet by mouth once a day VERAMYST 27.5 MCG/SPRAY SUSP (FLUTICASONE FUROATE) as needed DITROPAN XL 5 MG XR24H-TAB (OXYBUTYNIN CHLORIDE) Take one tablet once a day OMEPRAZOLE 20 MG TBEC (OMEPRAZOLE) Take one prior to breakfast every other day MULTIVITAMINS   TABS (MULTIPLE VITAMIN) one tablet by mouth once daily MAG-OX 400 400 MG TABS  (MAGNESIUM OXIDE) once daily ALIGN  CAPS (PROBIOTIC PRODUCT) once daily BENEFIBER  POWD (WHEAT DEXTRIN) once daily

## 2011-01-11 NOTE — Assessment & Plan Note (Signed)
Summary: Maurice Wood, blood in stool/dfs    History of Present Illness Visit Type: Follow-up Visit Primary GI MD: [name] FACP FAGA Primary Provider: [name], MD Requesting Provider: n/a Chief Complaint: Diarrhea constantly x 6 weeks, with occassional brb History of Present Illness:   Maurice Wood 77 Y.O MALE KNOWN TO DR. PATTERSON WITH HX OF IBS WITH ALTERNATING BOWEL HABITS. HE HAD A COLONOSCOPY 4/09 SHOWING DIVERTICULOSIS,AND INTERNAL HEMORRHOIDS.HE WAS LAST SEEN 1/11 ,HAS BEEN ON A HIGH FIBER DIETBENEFIBER MOST DAYS AND PHILLIPS COLON HEALTH. OVER THE PAST 6 WEEKS HE HAS HAD MORE FREQUENT EPISODES OF DIARRHEA,AND MAY HAVE SEVERAL DIARRHEAL STOOLS IN A DAY,THEN GOES BACK TO CONSTIPATION AND PELLET LIKE STOOLS. HE C/O INCREASED GAS,BLOATING,URGENCY.HE SEES INTERMITTENT SMALL AMTS OF BRB ON THE TISSUE OR ON HIS STOOL. NO RECTAL PAIN,DISCOMFORT.  APPEITE IS FINE,WEIGHT STABLE, NO NAUSEA. HE DOES NOT THINK THE COLON HEALTH  HAS HELPED.    GI Review of Systems    Reports acid reflux and  bloating.      Denies abdominal pain, belching, chest pain, dysphagia with liquids, dysphagia with solids, heartburn, loss of appetite, nausea, vomiting, vomiting blood, and  weight loss.      Reports change in bowel habits, constipation, diarrhea, hemorrhoids, irritable bowel syndrome, and  rectal bleeding.     Denies anal fissure, black tarry stools, diverticulosis, fecal incontinence, heme positive stool, jaundice, light color stool, liver problems, and  rectal pain.    Current Medications (verified): 1)  Warfarin Sodium 5 Mg Tabs (Warfarin Sodium) .... Qd As Directed Except 2.5 Mg T-Th 2)  Caltrate 600+d 600-400 Mg-Unit  Tabs (Calcium Carbonate-Vitamin D) .... One Tab Two Times A Day 3)  Aspirin 81 Mg  Tbec (Aspirin) .... Once Daily 4)  Zyrtec Allergy 10 Mg Tabs (Cetirizine Hcl) .... Once Daily As Needed 5)  Fish Oil 1200 Mg Caps (Omega-3 Fatty Acids) .... Take 1 Tablet By Mouth Once A Day 6)   Veramyst 27.5 Mcg/spray Susp (Fluticasone Furoate) .... As Needed 7)  Ditropan Xl 5 Mg Xr24h-Tab (Oxybutynin Chloride) .... Take One Tablet Once A Day 8)  Omeprazole 20 Mg Tbec (Omeprazole) .... Take One Prior To Breakfast Every Other Day 9)  Multivitamins   Tabs (Multiple Vitamin) .... One Tablet By Mouth Once Daily 10)  Phillips Colon Health  Caps (Probiotic Product) .... Once Daily  Allergies (verified): 1)  ! Biaxin 2)  ! Epinephrine 3)  ! Nimodipine (Nimodipine)  Past History:  Past Medical History:  1. Atrial fibrillation      a. Diagnosed in January 2007. Echo January 2007           normal ejection fraction, no significant valvular disease.      b.     Adenosine Cardiolite January 2007 no evidence of ischemia.      c.     A 48-hour Holter monitor in July 2007 showed good rate           control with chronic atrial fibrillation.  2. 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  3. Obstructive sleep apnea noncompliant with CPAP.  4. Cerebral aneurysm followed by Dr. Corliss Skains.  5. History of chest pain.      a.     Cardiac catheterization 1999 which was normal.      b.     Myoview 2007. normal      c.    Cardiac cath Jan 2011. Normal cors EF 45%  6.  Hypertension.  7. Obesity.  8. Fatigue.  9. Allergic rhinitis 10. Colonic polyps, hx of,diverticulosis-last colon 2009 11. Hyperlipidemia 12. Diabetes/Elevated Glucose 13. 3rd nerve palsy 14. Retinal vein occlusion 15. Benign prostatic hypertrophy 16. Irritable Bowel Syndrome  Past Surgical History: Cholecystectomy Inguinal and umbilical herniorrhaphy T&A  as child Cardiac Cath-1999 Colonoscopy-12/01/2003,4/09  Family History: Reviewed history from 11/22/2009 and no changes required. Lung Cancer: Father Family History of Colon Cancer:Maternal grandmother Family History of Diabetes: Son  Social History: Reviewed history from 07/23/2007 and no changes required. Former  Smoker Retired Married Alcohol use-yes  Review of Systems       The patient complains of allergy/sinus, back pain, change in vision, fatigue, hearing problems, heart rhythm changes, and muscle pains/cramps.  The patient denies anemia, anxiety-new, arthritis/joint pain, blood in urine, breast changes/lumps, confusion, cough, coughing up blood, depression-new, fainting, fever, headaches-new, heart murmur, itching, menstrual pain, night sweats, nosebleeds, pregnancy symptoms, shortness of breath, skin rash, sleeping problems, sore throat, swelling of feet/legs, swollen lymph glands, thirst - excessive , urination - excessive , urination changes/pain, urine leakage, vision changes, and voice change.         otherwise as in hpi  Vital Signs:  Patient profile:   77 year old male Height:      70 inches Weight:      189.13 pounds BMI:     27.24 Pulse rate:   56 / minute Pulse rhythm:   regular BP sitting:   90 / 60  (left arm) Cuff size:   regular  Vitals Entered By: June McMurray CMA Duncan Dull) (June 27, 2010 8:39 AM)  Physical Exam  General:  Well developed, well nourished, no acute distress. Head:  Normocephalic and atraumatic. Eyes:  PERRLA, no icterus. Neck:  Supple; no masses or thyromegaly. Lungs:  Clear throughout to auscultation. Heart:  Regular rate and rhythm; no murmurs, rubs,  or bruits. Abdomen:  LARGE SOFT, NONTENDER, NO MASS OR HSM,BS+ Rectal:  SIGNIFICANT PERIANAL ERYTHEMA,WITH SHALLOW FISSURE POSTERIOR  PERIANAL SKIN,NO EXTERNAL HEMORRHOIDS,SCANT STOOL TRACE HEME POSITIVE Neurologic:  Alert and  oriented x4;  grossly normal neurologically. Psych:  Alert and cooperative. Normal mood and affect.   Impression & Recommendations:  Problem # 1:  IRRITABLE BOWEL SYNDROME (ICD-564.1) Assessment Deteriorated 76 Y.O MALE W ITH LONG STANDING IBS ,ALTERNATING EPISODES OF DIARRHEA,AND CONSTIPATION WITH BLOATING,GAS. SUSPECT COMPONENT OF BACTERIAL OVERGROWTH  STOP CURRENT  PROBIOTIC,NO CHANGE AFTER SEVERAL MONTHS USE. RESTART DAILY USE OF BENEFIBER TRIAL OF XIFAXAN 550MG  TWICE DAILY FOR 10 DAYS. PTADVISED TO HAVE HI PT CHECKED EARLY NEXT WEEK WITH ANTIBIOTIC USE FOLLOW UP WITH DR. PATTERSON IN 3 WEEKS.  Problem # 2:  HEMORRHOIDS-INTERNAL (ICD-455.0) Assessment: Unchanged INTERMITTENT HEMATOCHEZIA,CONTINUE ANUSOL HC SUPP AS NEEDED ALSO SUSPECT HE MAY HAVE SOME BLEEDING FROM SVERE PERI ANAL IRRITATION (AND COUMADIN)- TRIAL OF BALMEX OR CALMOSEPTINE 3-4 TIMES DAILY. CBC TODAY.  Problem # 3:  CORONARY ARTERY DISEASE (ICD-414.00) Assessment: Comment Only  Problem # 4:  COUMADIN THERAPY (ICD-V58.61) Assessment: Comment Only HX ATRIAL FIB  Problem # 5:  DIVERTICULOSIS OF COLON (ICD-562.10) Assessment: Comment Only  Orders: TLB-CBC Platelet - w/Differential (85025-CBCD)  Patient Instructions: 1)  Please go to lab, basement level. 2)  We also made you a lab appointment for Mon 07-03-10 to have your Protime level checked. Anytime between 7:30Am - 5:30PM.  3)  We sent a prescription for Xifaxan to [facility] for you. 4)  Take Benefiber daily.  5)  We have given you samples of Calmoseptine ointment to  use 3-4 times daily on the rectal area. 6)  Copy sent to : [name], MD 7)  The medication list was reviewed and reconciled.  All changed / newly prescribed medications were explained.  A complete medication list was provided to the patient / caregiver. Prescriptions: XIFAXAN 550 MG TABS (RIFAXIMIN) Take 1 tab twice daily x 10 days  #20 x 0   Entered by:   Lowry Ram NCMA   Authorized by:   Sammuel Cooper PA-c   Signed by:   Lowry Ram NCMA on 06/27/2010   Method used:   Electronically to        [facility] [id number] (retail)       [address] [place], [place]  [address]       Ph: [phone]       Fax: [phone]   RxID:   [id number]

## 2011-01-11 NOTE — Procedures (Signed)
Summary: EGD   EGD  Procedure date:  11/05/2001  Findings:      Location: [facility]   Patient Name: Maurice, Wood MRN:  Procedure Procedures: Panendoscopy (EGD) CPT: 43235.    with biopsy(s)/brushing(s). CPT: D1846139.  Personnel: Endoscopist: Vania Rea. Jarold Motto, MD.  Referred By: Valetta Mole Swords, MD.  Exam Location: Exam performed in Outpatient Clinic. Outpatient  Patient Consent: Procedure, Alternatives, Risks and Benefits discussed, consent obtained, from patient.  Indications  Evaluation of: Iron Deficiency without Anemiadue to low Ferritin.  History  Pre-Exam Physical: Performed Nov 05, 2001  Cardio-pulmonary exam, Abdominal exam, Extremity exam, Mental status exam WNL.  Exam Exam Info: Maximum depth of insertion Duodenum, intended Duodenum. Patient position: on left side. Duration of exam: 15 minutes. Vocal cords visualized. Gastric retroflexion performed. Images taken. ASA Classification: I. Tolerance: excellent.  Sedation Meds: Patient assessed and found to be appropriate for moderate (conscious) sedation. Cetacaine Spray 1 sprays Fentanyl 100 mcg. Versed 4 mg.  Monitoring: BP and pulse monitoring done. Oximetry used. Supplemental O2 given  Instrument(s): PFC 160L. Serial [id number]. Serial #0.   Findings - Normal: Proximal Esophagus to Distal Esophagus. Not Seen: Ulcer. Esophageal inflammation. Stricture. Varices.  - HIATAL HERNIA: Prolapsing, 3 cms. in length. Biopsy/Hiatal Hernia taken.  ICD9: Hernia, Hiatal: 553.3. - NODULE: Maximum size: 5 mm. mucosal nodule in Distal Esophagus. Erosions not present. Biopsy/Nodule taken. Comments: R/O Barret's mucosa.  - Normal: Body to Jejunum. Not Seen: Tumor. Ulcer. Mucosal abnormality. Polyp.   Assessment  Diagnoses: 553.3: Hernia, Hiatal. GERD.   Comments: No real cause for anemia noted. Events  Unplanned Intervention: No unplanned interventions were required.   Plans Medication(s): Await pathology. PPI: Pantoprazole/Protonix 40 mg QAM, starting Nov 05, 2001 for indefinitely.  Other: Nu-iron  150mg . BID, starting Nov 05, 2001 for 4 wks.   Patient Education: Patient given standard instructions for: Reflux.  Disposition: After procedure patient sent to recovery. After recovery patient sent home.  Scheduling: Clinic Visit, to Vania Rea. Jarold Motto, MD, around Dec 05, 2001.   Comments: Will check his CBC and iron levels and sprue panel on his RTC visit.  cc: Valetta Mole. Swords, MD   This report was created from the original endoscopy report, which was reviewed and signed by the above listed endoscopist.

## 2011-01-11 NOTE — Assessment & Plan Note (Signed)
Summary: 4 month rov/njr   Vital Signs:  Patient profile:   77 year old male Weight:      190 pounds Temp:     98.5 degrees F oral Pulse rate:   56 / minute Pulse rhythm:   regular Resp:     12 per minute BP sitting:   98 / 66  (left arm) Cuff size:   regular  Vitals Entered By: Gladis Riffle, RN (March 29, 2010 8:30 AM) CC: 4 month rov, labs done Is Patient Diabetic? Yes Did you bring your meter with you today? No Comments states never told was diabetic, does not check CBGs at home   Primary Care Provider:  [name], MD  CC:  4 month rov and labs done.  History of Present Illness:  Follow-Up Visit      This is a 77 year old man who presents for Follow-up visit.  The patient denies chest pain and palpitations.  Since the last visit the patient notes no new problems or concerns.  The patient reports taking meds as prescribed.  When questioned about possible medication side effects, the patient notes none.    All other systems reviewed and were negative   Preventive Screening-Counseling & Management  Alcohol-Tobacco     Smoking Status: quit > 6 months     Year Started: 1952     Year Quit: 1973  Current Problems (verified): 1)  Diverticulosis of Colon  (ICD-562.10) 2)  Irritable Bowel Syndrome  (ICD-564.1) 3)  Gerd  (ICD-530.81) 4)  Erectile Dysfunction  (ICD-302.72) 5)  Hypercholesterolemia  (ICD-272.0) 6)  Benign Prostatic Hypertrophy  (ICD-600.00) 7)  Coumadin Therapy  (ICD-V58.61) 8)  Psa, Increased  (ICD-790.93) 9)  Anemia  (ICD-285.9) 10)  Diabetes Mellitus, Type II  (ICD-250.00) 11)  Dissection, Carotid Artery  (ICD-443.21) 12)  Obstructive Sleep Apnea  (ICD-327.23) 13)  Testosterone Deficiency  (ICD-257.2) 14)  Hyperlipidemia  (ICD-272.4) 15)  Colonic Polyps, Hx of  (ICD-V12.72) 16)  Atrial Fibrillation  (ICD-427.31)  Current Medications (verified): 1)  Warfarin Sodium 5 Mg Tabs (Warfarin Sodium) .... Qd As Directed Except 2.5 Mg T-Th 2)  Caltrate  600+d 600-400 Mg-Unit  Tabs (Calcium Carbonate-Vitamin D) .... One Tab Two Times A Day 3)  Aspirin 81 Mg  Tbec (Aspirin) .... Once Daily 4)  Zyrtec Allergy 10 Mg Tabs (Cetirizine Hcl) .... Once Daily As Needed 5)  Fish Oil 1200 Mg Caps (Omega-3 Fatty Acids) .... Take 1 Tablet By Mouth Once A Day 6)  Veramyst 27.5 Mcg/spray Susp (Fluticasone Furoate) .... As Needed 7)  Ditropan Xl 5 Mg Xr24h-Tab (Oxybutynin Chloride) .... Take One Tablet Once A Day 8)  Omeprazole 20 Mg Tbec (Omeprazole) .... Take One Prior To Breakfast Every Other Day 9)  Multivitamins   Tabs (Multiple Vitamin) .... One Tablet By Mouth Once Daily 10)  Phillips Colon Health  Caps (Probiotic Product) .... Once Daily  Allergies: 1)  ! Biaxin 2)  ! Epinephrine 3)  ! Nimodipine (Nimodipine)  Past History:  Past Medical History: Last updated: 01/11/2010  1. Atrial fibrillation      a. Diagnosed in January 2007. Echo January 2007           normal ejection fraction, no significant valvular disease.      b.     Adenosine Cardiolite January 2007 no evidence of ischemia.      c.     A 48-hour Holter monitor in July 2007 showed good rate  control with chronic atrial fibrillation.  2. 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  3. Obstructive sleep apnea noncompliant with CPAP.  4. Cerebral aneurysm followed by Dr. Corliss Skains.  5. History of chest pain.      a.     Cardiac catheterization 1999 which was normal.      b.     Myoview 2007. normal      c.    Cardiac cath Jan 2011. Normal cors EF 45%  6. Hypertension.  7. Obesity.  8. Fatigue.  9. Allergic rhinitis 10. Colonic polyps, hx of 11. Hyperlipidemia 12. Diabetes/Elevated Glucose 13. 3rd nerve palsy 14. Retinal vein occlusion 15. Benign prostatic hypertrophy 16. Irritable Bowel Syndrome  Past Surgical History: Last updated: 07/24/2007 Cholecystectomy Inguinal and umbilical herniorrhaphy T&A  as child Cardiac  Cath-1999 Colonoscopy-12/01/2003  Family History: Last updated: 11/22/2009 Lung Cancer: Father Family History of Colon Cancer:Maternal grandmother Family History of Diabetes: Son  Social History: Last updated: 07/23/2007 Former Smoker Retired Married Alcohol use-yes  Risk Factors: Smoking Status: quit > 6 months (03/29/2010)  Physical Exam  General:  alert and well-developed.  alert and well-developed.   Head:  normocephalic and atraumatic.  normocephalic and atraumatic.   Eyes:  pupils equal and pupils round.  pupils equal and pupils round.   Ears:  R ear normal and L ear normal.  R ear normal and L ear normal.   Neck:  No deformities, masses, or tenderness noted. Chest Wall:  no deformities and no tenderness.  no deformities and no tenderness.   Lungs:  normal respiratory effort and no intercostal retractions.  normal respiratory effort and no intercostal retractions.   Heart:  irregular rhythm rate controlled 2/6 HSM Abdomen:  normal bowel sounds. Soft and nontender. No masses. No guarding or rebound. Msk:  No deformity or scoliosis noted of thoracic or lumbar spine.   Neurologic:  cranial nerves II-XII intact and gait normal.  cranial nerves II-XII intact and gait normal.     Impression & Recommendations:  Problem # 1:  DIABETES MELLITUS, TYPE II (ICD-250.00)  well controlled and no meds weight loss His updated medication list for this problem includes:    Aspirin 81 Mg Tbec (Aspirin) ..... Once daily  Labs Reviewed: Creat: 1.0 (03/21/2010)     Last Eye Exam: normal-no retinopathy (05/10/2009) Reviewed HgBA1c results: 5.9 (03/21/2010)  5.9 (11/29/2009)  Problem # 2:  ANEMIA (ICD-285.9)  Hgb: 14.9 (01/03/2010)   Hct: 43.8 (01/03/2010)   Platelets: 165.0 (01/03/2010) RBC: 5.04 (01/03/2010)   RDW: 14.0 (01/03/2010)   WBC: 6.1 (01/03/2010) MCV: 87.0 (01/03/2010)   MCHC: 33.9 (01/03/2010) Ferritin: 46.2 (01/03/2010) Iron: 57 (01/03/2010)   % Sat: 17.8  (01/03/2010) B12: 380 (03/21/2010)   Folate: 9.9 (01/03/2010)   TSH: 2.14 (06/06/2009)  Problem # 3:  HYPERLIPIDEMIA (ICD-272.4) no meds Labs Reviewed: SGOT: 25 (03/21/2010)   SGPT: 15 (03/21/2010)   HDL:54.30 (03/21/2010), 42.80 (11/29/2009)  LDL:86 (11/29/2009), DEL (11/91/4782)  Chol:194 (03/21/2010), 154 (11/29/2009)  Trig:242.0 (03/21/2010), 128.0 (11/29/2009)  Problem # 4:  ATRIAL FIBRILLATION (ICD-427.31) rate controlled continue current medications  His updated medication list for this problem includes:    Warfarin Sodium 5 Mg Tabs (Warfarin sodium) ..... Qd as directed except 2.5 mg t-th    Aspirin 81 Mg Tbec (Aspirin) ..... Once daily  Orders: Protime ([id number]) Fingerstick ([id number])  Reviewed the following: PT: 18.5 (03/29/2010)   INR: 2.3 (03/29/2010) Next Protime: same dose (dated on 03/29/2010)  Problem # 5:  Preventive Health Care (ICD-V70.0) noted PSA results to dr Vernie Ammons he was given medrol dose pack by NS---he will start after return from [place]  Complete Medication List: 1)  Warfarin Sodium 5 Mg Tabs (Warfarin sodium) .... Qd as directed except 2.5 mg t-th 2)  Caltrate 600+d 600-400 Mg-unit Tabs (Calcium carbonate-vitamin d) .... One tab two times a day 3)  Aspirin 81 Mg Tbec (Aspirin) .... Once daily 4)  Zyrtec Allergy 10 Mg Tabs (Cetirizine hcl) .... Once daily as needed 5)  Fish Oil 1200 Mg Caps (Omega-3 fatty acids) .... Take 1 tablet by mouth once a day 6)  Veramyst 27.5 Mcg/spray Susp (Fluticasone furoate) .... As needed 7)  Ditropan Xl 5 Mg Xr24h-tab (Oxybutynin chloride) .... Take one tablet once a day 8)  Omeprazole 20 Mg Tbec (Omeprazole) .... Take one prior to breakfast every other day 9)  Multivitamins Tabs (Multiple vitamin) .... One tablet by mouth once daily 10)  Phillips Colon Health Caps (Probiotic product) .... Once daily  Patient Instructions: 1)  Please schedule a follow-up appointment in 4 months. 2)  labs one week prior to visit 3)   lipids---272.4 4)  lfts-995.2 5)  bmet-995.2 6)  A1C-250.02 7)     8)  It is important that you exercise regularly at least 20 minutes 5 times a week. If you develop chest pain, have severe difficulty breathing, or feel very tired , stop exercising immediately and seek medical attention. 9)  You need to lose weight. Consider a lower calorie diet and regular exercise.   Laboratory Results   Blood Tests   Date/Time Recieved: March 29, 2010 8:45 AM  Date/Time Reported: March 29, 2010 8:45 AM   PT: 18.5 s   (Normal Range: 10.6-13.4)  INR: 2.3   (Normal Range: 0.88-1.12   Therap INR: 2.0-3.5) Comments: Wynona Canes, CMA  March 29, 2010 8:45 AM       ANTICOAGULATION RECORD PREVIOUS REGIMEN & LAB RESULTS Anticoagulation Diagnosis:  v58.83,v58.61,427.31 on  07/14/2007 Previous INR Goal Range:  2.0-3.0 on  05/12/2008 Previous INR:  2.4 on  03/02/2010 Previous Coumadin Dose(mg):  2.5 Tues,Thurs others 5mg  on  09/15/2009 Previous Regimen:  same on  03/02/2010 Previous Coagulation Comments:  By.K on  09/24/2008  NEW REGIMEN & LAB RESULTS Current INR: 2.3 Regimen: same  (no change)       Repeat testing in: same dose MEDICATIONS WARFARIN SODIUM 5 MG TABS (WARFARIN SODIUM) qd as directed except 2.5 mg T-TH CALTRATE 600+D 600-400 MG-UNIT  TABS (CALCIUM CARBONATE-VITAMIN D) one tab two times a day ASPIRIN 81 MG  TBEC (ASPIRIN) once daily ZYRTEC ALLERGY 10 MG TABS (CETIRIZINE HCL) once daily as needed FISH OIL 1200 MG CAPS (OMEGA-3 FATTY ACIDS) Take 1 tablet by mouth once a day VERAMYST 27.5 MCG/SPRAY SUSP (FLUTICASONE FUROATE) as needed DITROPAN XL 5 MG XR24H-TAB (OXYBUTYNIN CHLORIDE) Take one tablet once a day OMEPRAZOLE 20 MG TBEC (OMEPRAZOLE) Take one prior to breakfast every other day MULTIVITAMINS   TABS (MULTIPLE VITAMIN) one tablet by mouth once daily PHILLIPS COLON HEALTH  CAPS (PROBIOTIC PRODUCT) once daily   Anticoagulation Visit Questionnaire      Coumadin dose  missed/changed:  No      Abnormal Bleeding Symptoms:  No   Any diet changes including alcohol intake, vegetables or greens since the last visit:  No Any illnesses or hospitalizations since the last visit:  No Any signs of clotting since the last visit (including chest discomfort, dizziness, shortness  of breath, arm tingling, slurred speech, swelling or redness in leg):  No

## 2011-01-11 NOTE — Assessment & Plan Note (Signed)
Summary: congestion/tsc   Vital Signs:  Patient profile:   77 year old male Weight:      198 pounds Temp:     98.3 degrees F oral Pulse rate:   80 / minute Pulse rhythm:   regular BP sitting:   110 / 80  (left arm) Cuff size:   regular  Vitals Entered By: Liane Comber CMA Duncan Dull) (August 20, 2009 12:07 PM)  History of Present Illness:       This is a 77 year old man who presents with URI symptoms.  The symptoms began 1 week ago.  Pt has tried zyrtec, afrin, benadryl and sudafed with no relief.  The patient complains of nasal congestion, purulent nasal discharge, and productive cough.  The patient denies fever, low-grade fever (<100.5 degrees), fever of 100.5-103 degrees, fever of 103.1-104 degrees, fever to >104 degrees, stiff neck, dyspnea, wheezing, rash, vomiting, diarrhea, use of an antipyretic, and response to antipyretic.  The patient also reports headache.  The patient denies the following risk factors for Strep sinusitis: unilateral facial pain, unilateral nasal discharge, poor response to decongestant, double sickening, tooth pain, Strep exposure, tender adenopathy, and absence of cough.    Current Medications (verified): 1)  Warfarin Sodium 5 Mg Tabs (Warfarin Sodium) .... Qd As Directed 2)  Prilosec Otc 20 Mg  Tbec (Omeprazole Magnesium) .... Take 1 Tablet By Mouth Once Every Other Day 3)  Caltrate 600+d 600-400 Mg-Unit  Tabs (Calcium Carbonate-Vitamin D) .... Once Daily 4)  Aspirin 81 Mg  Tbec (Aspirin) .... Once Daily 5)  Zyrtec Allergy 10 Mg Tabs (Cetirizine Hcl) .... Once Daily As Needed 6)  Toviaz 8 Mg Xr24h-Tab (Fesoterodine Fumarate) .... Take 1 Tablet By Mouth Once A Day 7)  Fish Oil 1200 Mg Caps (Omega-3 Fatty Acids) .... Take 1 Tablet By Mouth Once A Day 8)  Augmentin 875-125 Mg Tabs (Amoxicillin-Pot Clavulanate) .Marland Kitchen.. 1 By Mouth Two Times A Day 9)  Veramyst 27.5 Mcg/spray Susp (Fluticasone Furoate) .... 2 Sprays Each Nostril Once Daily  Allergies  (verified): 1)  ! Biaxin 2)  ! Epinephrine 3)  ! Nimodipine (Nimodipine)  Past History:  Past medical, surgical, family and social histories (including risk factors) reviewed for relevance to current acute and chronic problems.  Past Medical History: Reviewed history from 02/08/2009 and no changes required.  1. Atrial fibrillation      a. Diagnosed in January 2007. Echo January 2007           normal ejection fraction, no significant valvular disease.      b.     Adenosine Cardiolite January 2007 no evidence of ischemia.      c.     A 48-hour Holter monitor in July 2007 showed good rate           control with chronic atrial fibrillation.  2. 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  3. Obstructive sleep apnea noncompliant with CPAP.  4. Cerebral aneurysm followed by Dr. Corliss Skains.  5. History of chest pain.      a.     Cardiac catheterization 1999 which was normal.      c.     Myoview 2007. normal  6. Hypertension.  7. Obesity.  8. Fatigue.  9. Allergic rhinitis 10. Colonic polyps, hx of 11. Hyperlipidemia 12. Diabetes/Elevated Glucose 13. 3rd nerve palsy 14. Retinal vein occlusion 15. Benign prostatic hypertrophy  Past Surgical History: Reviewed history from 07/24/2007 and no changes  required. Cholecystectomy Inguinal and umbilical herniorrhaphy T&A  as child Cardiac Cath-1999 Colonoscopy-12/01/2003  Family History: Reviewed history and no changes required.  Social History: Reviewed history from 07/23/2007 and no changes required. Former Smoker Retired Married Alcohol use-yes  Review of Systems      See HPI  Physical Exam  General:  Well-developed,well-nourished,in no acute distress; alert,appropriate and cooperative throughout examination Ears:  External ear exam shows no significant lesions or deformities.  Otoscopic examination reveals clear canals, tympanic membranes are intact bilaterally without bulging, retraction,  inflammation or discharge. Hearing is grossly normal bilaterally. Nose:  L frontal sinus tenderness, L maxillary sinus tenderness, R frontal sinus tenderness, and R maxillary sinus tenderness.   Mouth:  Oral mucosa and oropharynx without lesions or exudates.  Teeth in good repair. Neck:  cervical lymphadenopathy.   Lungs:  Normal respiratory effort, chest expands symmetrically. Lungs are clear to auscultation, no crackles or wheezes. Heart:  Normal rate and regular rhythm. S1 and S2 normal without gallop, murmur, click, rub or other extra sounds. Extremities:  No clubbing, cyanosis, edema, or deformity noted with normal full range of motion of all joints.   Skin:  Intact without suspicious lesions or rashes Cervical Nodes:  No lymphadenopathy noted Psych:  Cognition and judgment appear intact. Alert and cooperative with normal attention span and concentration. No apparent delusions, illusions, hallucinations   Impression & Recommendations:  Problem # 1:  SINUSITIS - ACUTE-NOS (ICD-461.9)  His updated medication list for this problem includes:    Augmentin 875-125 Mg Tabs (Amoxicillin-pot clavulanate) .Marland Kitchen... 1 by mouth two times a day    Veramyst 27.5 Mcg/spray Susp (Fluticasone furoate) .Marland Kitchen... 2 sprays each nostril once daily  Instructed on treatment. Call if symptoms persist or worsen.   Complete Medication List: 1)  Warfarin Sodium 5 Mg Tabs (Warfarin sodium) .... Qd as directed 2)  Prilosec Otc 20 Mg Tbec (Omeprazole magnesium) .... Take 1 tablet by mouth once every other day 3)  Caltrate 600+d 600-400 Mg-unit Tabs (Calcium carbonate-vitamin d) .... Once daily 4)  Aspirin 81 Mg Tbec (Aspirin) .... Once daily 5)  Zyrtec Allergy 10 Mg Tabs (Cetirizine hcl) .... Once daily as needed 6)  Toviaz 8 Mg Xr24h-tab (Fesoterodine fumarate) .... Take 1 tablet by mouth once a day 7)  Fish Oil 1200 Mg Caps (Omega-3 fatty acids) .... Take 1 tablet by mouth once a day 8)  Augmentin 875-125 Mg Tabs  (Amoxicillin-pot clavulanate) .Marland Kitchen.. 1 by mouth two times a day 9)  Veramyst 27.5 Mcg/spray Susp (Fluticasone furoate) .... 2 sprays each nostril once daily Prescriptions: AUGMENTIN 875-125 MG TABS (AMOXICILLIN-POT CLAVULANATE) 1 by mouth two times a day  #20 x 0   Entered and Authorized by:   Loreen Freud DO   Signed by:   Loreen Freud DO on 08/20/2009   Method used:   Electronically to        [id number] (retail)       [address] [place], [place]  [address]       Ph: [phone]       Fax: [phone]   RxID:   [id number]

## 2011-01-11 NOTE — Assessment & Plan Note (Signed)
Summary: pt/njr   Nurse Visit   Allergies: 1)  ! Biaxin 2)  ! Epinephrine 3)  ! Nimodipine (Nimodipine) Laboratory Results   Blood Tests      INR: 2.8   (Normal Range: 0.88-1.12   Therap INR: 2.0-3.5) Comments: [name]  June 13, 2010 10:02 AM     Orders Added: 1)  Est. Patient Level I [99211] 2)  Protime [14782NF]    ANTICOAGULATION RECORD PREVIOUS REGIMEN & LAB RESULTS Anticoagulation Diagnosis:  v58.83,v58.61,427.31 on  07/14/2007 Previous INR Goal Range:  2.0-3.0 on  05/12/2008 Previous INR:  2.3 on  05/15/2010 Previous Coumadin Dose(mg):  2.5 Tues,Thurs others 5mg  on  09/15/2009 Previous Regimen:  same on  05/15/2010 Previous Coagulation Comments:  Dr. Lovell Sheehan approved on  04/12/2010  NEW REGIMEN & LAB RESULTS Current INR: 2.8 Regimen: same  Repeat testing in: 4 weeks  Anticoagulation Visit Questionnaire Coumadin dose missed/changed:  No Abnormal Bleeding Symptoms:  No  Any diet changes including alcohol intake, vegetables or greens since the last visit:  No Any illnesses or hospitalizations since the last visit:  No Any signs of clotting since the last visit (including chest discomfort, dizziness, shortness of breath, arm tingling, slurred speech, swelling or redness in leg):  No  MEDICATIONS WARFARIN SODIUM 5 MG TABS (WARFARIN SODIUM) qd as directed except 2.5 mg T-TH CALTRATE 600+D 600-400 MG-UNIT  TABS (CALCIUM CARBONATE-VITAMIN D) one tab two times a day ASPIRIN 81 MG  TBEC (ASPIRIN) once daily ZYRTEC ALLERGY 10 MG TABS (CETIRIZINE HCL) once daily as needed FISH OIL 1200 MG CAPS (OMEGA-3 FATTY ACIDS) Take 1 tablet by mouth once a day VERAMYST 27.5 MCG/SPRAY SUSP (FLUTICASONE FUROATE) as needed DITROPAN XL 5 MG XR24H-TAB (OXYBUTYNIN CHLORIDE) Take one tablet once a day OMEPRAZOLE 20 MG TBEC (OMEPRAZOLE) Take one prior to breakfast every other day MULTIVITAMINS   TABS (MULTIPLE VITAMIN) one tablet by mouth once daily PHILLIPS COLON HEALTH   CAPS (PROBIOTIC PRODUCT) once daily

## 2011-01-11 NOTE — Letter (Signed)
Summary: [facility]   [facility]   Imported By: Maryln Gottron 06/07/2010 10:35:47  _____________________________________________________________________  External Attachment:    Type:   Image     Comment:   External Document

## 2011-01-11 NOTE — Letter (Signed)
Summary: [facility]  [facility]   Imported By: Maryln Gottron 03/29/2010 14:36:51  _____________________________________________________________________  External Attachment:    Type:   Image     Comment:   External Document

## 2011-01-11 NOTE — Progress Notes (Signed)
Summary: lab result   Phone Note Call from Patient Call back at Home Phone [phone]   Caller: Spouse Call For: Dr Jarold Motto Reason for Call: Talk to Nurse Summary of Call: Patient wants to know if he was tested for Celiac Initial call taken by: Tawni Levy,  August 30, 2010 10:50 AM  Follow-up for Phone Call        Discussed biopsy results with pt. Follow-up by: Teryl Lucy RN,  August 30, 2010 2:54 PM

## 2011-01-11 NOTE — Assessment & Plan Note (Signed)
Summary: per check out/sf      Allergies Added:   Visit Type:  Follow-up Referring Arianna Haydon:  n/a Primary Lynx Goodrich:  [name], MD  CC:  no complaints.  History of Present Illness: Maurice Wood is a delightful 77 year old male with a history of chronic atrial fibrillation, obstructive sleep apnea, with noncompliance with CPAP. 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 Had episode of CP in January 2011 and underwent cath which showed normal coronaries with ef 45% (? artificially low due to AF). Echo  2/11: 60-65%  Returns for f/u. Doing well.  No CP, SOB or palpitations. No edema, orthopnea or PND. Taking coumadin religiously. No bleeding. INR therpaeutic. Still not exercising routinely despite wife's urgings to walk more. Has mild ankle edema at night. Resolves overnight.  Current Medications (verified): 1)  Warfarin Sodium 5 Mg Tabs (Warfarin Sodium) .... Qd As Directed Except 2.5 Mg T-Th 2)  Caltrate 600+d 600-400 Mg-Unit  Tabs (Calcium Carbonate-Vitamin D) .... One Tab Two Times A Day 3)  Aspirin 81 Mg  Tbec (Aspirin) .... Once Daily 4)  Zyrtec Allergy 10 Mg Tabs (Cetirizine Hcl) .... Once Daily As Needed 5)  Fish Oil 1200 Mg Caps (Omega-3 Fatty Acids) .... Take 1 Tablet By Mouth Once A Day 6)  Veramyst 27.5 Mcg/spray Susp (Fluticasone Furoate) .... As Needed 7)  Ditropan Xl 5 Mg Xr24h-Tab (Oxybutynin Chloride) .... Take One Tablet Once A Day 8)  Omeprazole 20 Mg Tbec (Omeprazole) .... Take One Prior To Breakfast Every Other Day 9)  Multivitamins   Tabs (Multiple Vitamin) .... One Tablet By Mouth Once Daily  Allergies (verified): 1)  ! Biaxin 2)  ! Epinephrine 3)  ! Nimodipine (Nimodipine)  Past History:  Past Medical History:  1. Atrial fibrillation      a. Diagnosed in January 2007. Echo January 2007           normal ejection fraction, no significant valvular disease.      b.      Adenosine Cardiolite January 2007 no evidence of ischemia.      c.     A 48-hour Holter monitor in July 2007 showed good rate           control with chronic atrial fibrillation.      d. Cath 1/11: Normal cors. EF 45%      e. Echo  2/11: 60-65%  2. 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  3. Obstructive sleep apnea noncompliant with CPAP.  4. Cerebral aneurysm followed by Dr. Corliss Skains.  5. History of chest pain.      a.     Cardiac catheterization 1999 which was normal.      b.     Myoview 2007. normal      c.    Cardiac cath Jan 2011. Normal cors EF 45%  6. Hypertension.  7. Obesity.  8. Fatigue.  9. Allergic rhinitis 10. Colonic polyps, hx of,diverticulosis-last colon 2009 11. Hyperlipidemia 12. Diabetes/Elevated Glucose 13. 3rd nerve palsy 14. Retinal vein occlusion 15. Benign prostatic hypertrophy 16. Irritable Bowel Syndrome  Review of Systems       As per HPI and past medical history; otherwise all systems negative.   Vital Signs:  Patient profile:   77 year old male Height:      70 inches Weight:      189 pounds BMI:  27.22 Pulse rate:   86 / minute BP sitting:   92 / 64  (left arm) Cuff size:   regular  Vitals Entered By: Hardin Negus, RMA (July 24, 2010 1:55 PM)  Physical Exam  General:  well appearing. no resp difficulty HEENT: normal Neck: supple. no JVD. Carotids 2+ bilat; no bruits. No lymphadenopathy or thryomegaly appreciated. Cor: PMI nondisplaced. Irregular No rubs, gallops, murmur. Lungs: clear Abdomen: obese soft, nontender, nondistended. Good bowel sounds. Extremities: no cyanosis, clubbing, rash, edema Neuro: alert & orientedx3, cranial nerves grossly intact. moves all 4 extremities w/o difficulty. affect pleasant   Impression & Recommendations:  Problem # 1:  ATRIAL FIBRILLATION (ICD-427.31) Rate controlled. Asymptomatic. Tolerating coumadin. Continue current regimen.  Other Orders: EKG w/  Interpretation (93000)

## 2011-01-11 NOTE — Assessment & Plan Note (Signed)
Summary: 2 WK ROV - Dr. Jarold Motto patient    History of Present Illness Visit Type: Follow-up Visit Primary GI MD: [name] FACP FAGA Primary Provider: [name], MD Requesting Provider: n/a Chief Complaint: Patient here to follow up on his diarrhea and brb which he denies at this time. He says he has been having normal BMs since Saturday and sees no blood in his stool.  History of Present Illness:   PLEASANT 77 Y.O MALE KNOWN TO DR. PATTERSON WHO WAS SEEN  EARLIER THIS MONTH WITH C/O  FREQUENT EPISODES OF DIARRHEA,,ALTERNATING WITH  CONSTIPATION, BLOATING AND GAS. HE HAD ALSO BEEN HAVING ANORECTAL IRRITATION AND INTERMITTENT SCANT HEMATOCHEZIA.  HE WAS STARTED ON BENEFIBER DAILY AND WAS GIVEN A COURSE OF XIFAXAN 550 two times a day X 10 DAYS. ADVISED BALMEX FOR ANORECTAL SKIN IRRITATION. COMES IN TODAY FEELING MUCH BETTER. MUCH LESS BLOATING, AND HAVING FORMED STOOLS. NO FURTHER BLEEDING AND ANORECTAL IRRITATION RESOLVED.HE DOES FEEL THE BENEFIBER CAUSES SOME GAS.   GI Review of Systems      Denies abdominal pain, acid reflux, belching, bloating, chest pain, dysphagia with liquids, dysphagia with solids, heartburn, loss of appetite, nausea, vomiting, vomiting blood, weight loss, and  weight gain.        Denies anal fissure, black tarry stools, change in bowel habit, constipation, diarrhea, diverticulosis, fecal incontinence, heme positive stool, hemorrhoids, irritable bowel syndrome, jaundice, light color stool, liver problems, rectal bleeding, and  rectal pain.    Current Medications (verified): 1)  Warfarin Sodium 5 Mg Tabs (Warfarin Sodium) .... Qd As Directed Except 2.5 Mg T-Th 2)  Caltrate 600+d 600-400 Mg-Unit  Tabs (Calcium Carbonate-Vitamin D) .... One Tab Two Times A Day 3)  Aspirin 81 Mg  Tbec (Aspirin) .... Once Daily 4)  Zyrtec Allergy 10 Mg Tabs (Cetirizine Hcl) .... Once Daily As Needed 5)  Fish Oil 1200 Mg Caps (Omega-3 Fatty Acids) .... Take 1 Tablet By  Mouth Once A Day 6)  Veramyst 27.5 Mcg/spray Susp (Fluticasone Furoate) .... As Needed 7)  Ditropan Xl 5 Mg Xr24h-Tab (Oxybutynin Chloride) .... Take One Tablet Once A Day 8)  Omeprazole 20 Mg Tbec (Omeprazole) .... Take One Prior To Breakfast Every Other Day 9)  Multivitamins   Tabs (Multiple Vitamin) .... One Tablet By Mouth Once Daily  Allergies (verified): 1)  ! Biaxin 2)  ! Epinephrine 3)  ! Nimodipine (Nimodipine)  Past History:  Past Medical History: Reviewed history from 06/27/2010 and no changes required.  1. Atrial fibrillation      a. Diagnosed in January 2007. Echo January 2007           normal ejection fraction, no significant valvular disease.      b.     Adenosine Cardiolite January 2007 no evidence of ischemia.      c.     A 48-hour Holter monitor in July 2007 showed good rate           control with chronic atrial fibrillation.  2. 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  3. Obstructive sleep apnea noncompliant with CPAP.  4. Cerebral aneurysm followed by Dr. Corliss Skains.  5. History of chest pain.      a.     Cardiac catheterization 1999 which was normal.      b.     Myoview 2007. normal      c.    Cardiac cath Jan 2011. Normal cors EF 45%  6. Hypertension.  7. Obesity.  8. Fatigue.  9. Allergic rhinitis 10. Colonic polyps, hx of,diverticulosis-last colon 2009 11. Hyperlipidemia 12. Diabetes/Elevated Glucose 13. 3rd nerve palsy 14. Retinal vein occlusion 15. Benign prostatic hypertrophy 16. Irritable Bowel Syndrome  Past Surgical History: Reviewed history from 06/27/2010 and no changes required. Cholecystectomy Inguinal and umbilical herniorrhaphy T&A  as child Cardiac Cath-1999 Colonoscopy-12/01/2003,4/09  Family History: Reviewed history from 11/22/2009 and no changes required. Lung Cancer: Father Family History of Colon Cancer:Maternal grandmother Family History of Diabetes: Son  Social History: Reviewed  history from 07/23/2007 and no changes required. Former Smoker Retired Married Alcohol use-yes  Review of Systems       The patient complains of allergy/sinus, change in vision, fatigue, and muscle pains/cramps.  The patient denies anemia, anxiety-new, arthritis/joint pain, back pain, blood in urine, breast changes/lumps, confusion, cough, coughing up blood, depression-new, fainting, fever, headaches-new, hearing problems, heart murmur, heart rhythm changes, itching, menstrual pain, night sweats, nosebleeds, pregnancy symptoms, shortness of breath, skin rash, sleeping problems, sore throat, swelling of feet/legs, swollen lymph glands, thirst - excessive , urination - excessive , urination changes/pain, urine leakage, vision changes, and voice change.         ROS OTHERWISE AS ABOVE  Vital Signs:  Patient profile:   77 year old male Height:      70 inches Weight:      192.4 pounds BMI:     27.71 Pulse rate:   60 / minute Pulse rhythm:   regular BP sitting:   100 / 70  (left arm) Cuff size:   regular  Vitals Entered By: Harlow Mares CMA Duncan Dull) (July 10, 2010 9:01 AM)  Physical Exam  General:  Well developed, well nourished, no acute distress. Head:  Normocephalic and atraumatic. Eyes:  PERRLA, no icterus. Abdomen:  NOT FURTHER EXAMINED-DISCUSSION TODAY Neurologic:  Alert and  oriented x4;  grossly normal neurologically. Psych:  Alert and cooperative. Normal mood and affect.   Impression & Recommendations:  Problem # 1:  SMALL BOWEL BAC TERIAL OVERGROWTH (ICD-560.9) Assessment Improved 76 YO MALE WITH IBS/BACTERIAL OVERGROWTH  SYMPTOMATICALLY IMPROVED AFTER XIFAXAN RX  CONTINUE BENEFIBER DAILY OR EVERY OTHER DAY FOLLOW UP AS NEEDED. ADVISED  POSSIBLE PERIODIC RETREATMENT IF SXS RECUR.  Problem # 2:  HEMORRHOIDS-INTERNAL (ICD-455.0) Assessment: Improved  Problem # 3:  CORONARY ARTERY DISEASE (ICD-414.00) Assessment: Comment Only  Problem # 4:  DIVERTICULOSIS OF COLON  (ICD-562.10) Assessment: Comment Only  Problem # 5:  DIABETES MELLITUS, TYPE II (ICD-250.00) Assessment: Comment Only  Problem # 6:  COLONIC POLYPS, HX OF (ICD-V12.72) Assessment: Comment Only LAST COLON 2009-NO POLYPS,PT WITH FAMILY HX OF COLON CANCER  FOLLOW UP COLONOSCOPY 2014.  Patient Instructions: 1)  Take 1 Align capsule x 7 days. 2)  Samples given.

## 2011-01-11 NOTE — Progress Notes (Signed)
Summary: [employer] approved the Prior Auth for Xifaxan 500 MG   Phone Note Outgoing Call   Call placed by: Joselyn Glassman,  June 27, 2010 4:06 PM Call placed to: Patient Summary of Call: I called the patient and let him know that I was working on the Prior authorization for the Xifaxan 550 MG.  I told him I would call him once we found out if [facility] approved the medication.  Amy Esterwood PA filled out the [facility] form with the proper code and I faxed it today at [facility].  I received the fax back at 4PM with approval for the medication.  I faxed [facility] with this approval letter and called them.  They will be getting the medication  tomorrow and the pt could pick it up after 3PM.  I call the pt and let him know.  He thanked Amy and I  for our help.   Initial call taken by: Joselyn Glassman,  June 27, 2010 4:10 PM

## 2011-01-11 NOTE — Letter (Signed)
Summary: Discharge Letter  [facility]  [address] [place], [place] [address]   Phone: [phone]  Fax: [phone]       01/02/2011 MRN: [id number]  Maurice Wood [address] [place], [place]  [address]  Dear [name],   I find it necessary to inform you that I will not be able to provide medical care to you, due to the lack of your confidence in my medical ability.  Since your condition requires medical attention, I suggest that you place yourself under the care of another physician without delay. If you desire, I will be available for emergency care for 30 days after you receive this letter.  This should give you ample time to select a physician of your choice from the many competent providers in this area. You may want to call the local medical society or [facility]'s physician referral service [phone]) for their assistance in locating a new physician. With your written authorization, I will make a copy of your medical record available to your new physician.   Sincerely,    [name] [facility]  Appended Document: Discharge Letter printed and sent down to medical records  Appended Document: Discharge Letter Dismissal Letter sent by certified mail.

## 2011-01-11 NOTE — Assessment & Plan Note (Signed)
Summary: NECK PAIN (DENIES INJURY) // RS   Vital Signs:  Patient profile:   77 year old male Temp:     98.7 degrees F oral BP sitting:   100 / 70  (left arm) Cuff size:   regular  Vitals Entered By: Sid Falcon LPN (February 16, 2010 4:00 PM) CC: neck pain several months   History of Present Illness: Acute visit. Left trapezius and neck pain several weeks duration somewhat progressive in severity. No chest pain.  pain is described as dull to sharp and worse with head movement particularly lateral bending to the left. No arm pain. Tylenol and heat helps slightly. Used topical capsaicin cream without much improvement. No history of cervical disc problems. No pain with abduction left shoulder  Allergies: 1)  ! Biaxin 2)  ! Epinephrine 3)  ! Nimodipine (Nimodipine)  Past History:  Past Medical History: Last updated: 01/11/2010  1. Atrial fibrillation      a. Diagnosed in January 2007. Echo January 2007           normal ejection fraction, no significant valvular disease.      b.     Adenosine Cardiolite January 2007 no evidence of ischemia.      c.     A 48-hour Holter monitor in July 2007 showed good rate           control with chronic atrial fibrillation.  2. 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  3. Obstructive sleep apnea noncompliant with CPAP.  4. Cerebral aneurysm followed by Dr. Corliss Skains.  5. History of chest pain.      a.     Cardiac catheterization 1999 which was normal.      b.     Myoview 2007. normal      c.    Cardiac cath Jan 2011. Normal cors EF 45%  6. Hypertension.  7. Obesity.  8. Fatigue.  9. Allergic rhinitis 10. Colonic polyps, hx of 11. Hyperlipidemia 12. Diabetes/Elevated Glucose 13. 3rd nerve palsy 14. Retinal vein occlusion 15. Benign prostatic hypertrophy 16. Irritable Bowel Syndrome PMH reviewed for relevance  Review of Systems  The patient denies anorexia, fever, weight loss, and chest pain.     Physical Exam  General:  Well-developed,well-nourished,in no acute distress; alert,appropriate and cooperative throughout examination Neck:  fairly good range of motion with flexion and extension. He has mild pain with lateral bending to the left side but not the right. Lungs:  Normal respiratory effort, chest expands symmetrically. Lungs are clear to auscultation, no crackles or wheezes. Heart:  irregular rhythm rate controlled Extremities:  Full ROM L shoulder .  No impingement findings. Neurologic:  alert & oriented X3, cranial nerves II-XII intact, and strength normal in all extremities.  1+ symmetric reflexes upper extremities.  No muscle atrophy   Impression & Recommendations:  Problem # 1:  NECK PAIN (ICD-723.1) ?cervical nerve root impingement.  Nonfocal neuro exam.  Cervical x-rays and consider PT if unremarkable. His updated medication list for this problem includes:    Aspirin 81 Mg Tbec (Aspirin) ..... Once daily  Orders: T-Cervical Spine Comp 4 Views ([id number])  Complete Medication List: 1)  Warfarin Sodium 5 Mg Tabs (Warfarin sodium) .... Qd as directed except 2.5 mg m-w-f 2)  Caltrate 600+d 600-400 Mg-unit Tabs (Calcium carbonate-vitamin d) .... One tab two times a day 3)  Aspirin 81 Mg Tbec (Aspirin) .... Once daily 4)  Zyrtec Allergy 10 Mg Tabs (  Cetirizine hcl) .... Once daily as needed 5)  Fish Oil 1200 Mg Caps (Omega-3 fatty acids) .... Take 1 tablet by mouth once a day 6)  Veramyst 27.5 Mcg/spray Susp (Fluticasone furoate) .... As needed 7)  Ditropan Xl 5 Mg Xr24h-tab (Oxybutynin chloride) .... Take two tablets once a day 8)  Omeprazole 40 Mg Cpdr (Omeprazole) .... Take 1 tab 30 min prior to breakfast every day 9)  Multivitamins Tabs (Multiple vitamin) .... One tablet by mouth once daily 10)  Carafate 1 Gm/79ml Susp (Sucralfate) .Marland Kitchen.. 1 tbsp 1 6-8 hrs as needed 11)  Phillips Colon Health Caps (Probiotic product) .... Once daily  Patient Instructions: 1)  Follow  up promptly if you note any progressive pain or any weakness.

## 2011-01-11 NOTE — Progress Notes (Signed)
Summary: Switch from Dr Jarold Motto   Phone Note From Other Clinic   Caller: Merlyn Lot at Dr Cato Mulligan Call For: Dr Jarold Motto Reason for Call: Schedule Patient Appt Summary of Call: Would like to switch from Dr Jarold Motto to Dr Christella Hartigan because he feels he lost faith in Dr Jarold Motto. Initial call taken by: Leanor Kail [facility],  January 01, 2011 11:22 AM  Follow-up for Phone Call        Dr Jarold Motto, is this OK? Graciella Freer RN  January 01, 2011 1:16 PM   Additional Follow-up for Phone Call Additional follow up Details #1::        sure Additional Follow-up by: Mardella Layman MD FACG,  January 01, 2011 2:08 PM    Additional Follow-up for Phone Call Additional follow up Details #2::    Dr Christella Hartigan, it this ok with you? Lance Morin RN  January 01, 2011 2:10 PM   Additional Follow-up for Phone Call Additional follow up Details #3:: Details for Additional Follow-up Action Taken: no thank you Additional Follow-up by: Rachael Fee MD,  January 01, 2011 2:15 PM   Appended Document: Switch from Dr Jarold Motto do not schedule with me again...  Appended Document: Switch from Dr Jarold Motto advised pt that Dr. Christella Hartigan has declined to accept the pt and that Dr. Jarold Motto declined to see the patient again. The patient would like me to send this note back to Dr. Cato Mulligan office and they can refer him to another practice. I have called terry to advise her she will contact the patient.

## 2011-01-11 NOTE — Letter (Signed)
Summary: [facility]  [facility]   Imported By: Maryln Gottron 10/27/2010 12:22:59  _____________________________________________________________________  External Attachment:    Type:   Image     Comment:   External Document

## 2011-01-11 NOTE — Progress Notes (Signed)
Summary: [facility] labs   Phone Note Outgoing Call   Call placed by: Ashok Cordia RN,  January 25, 2010 10:51 AM Summary of Call: Received labs form [facility].  Per Dr. Jarold Motto.  If pt is still having diarrhea then needs to be on gluten free diet. Initial call taken by: Ashok Cordia RN,  January 25, 2010 10:54 AM  Follow-up for Phone Call        Talked with pt.  States he is not having any diarrhea at the present time.  Doing well.  It instructed to call [facility] for instruction in gluten free diet. Follow-up by: Ashok Cordia RN,  January 25, 2010 10:55 AM

## 2011-01-11 NOTE — Progress Notes (Signed)
Summary: when to get PT  Phone Note Call from Patient Call back at Home Phone [phone]   Caller: Corliss Skains, Summary of Call: Had heart cath.  Last dose of coumadin was Wed.  Had Vit K before cath.  Has instructions to resume coumadin 5 mg today, T, W, 2.5 mg TH, 5mg  F, Sa, Su and to call you to find out when to get PT recheck.   Initial call taken by: Rudy Jew, RN,  December 19, 2009 1:02 PM  Follow-up for Phone Call        friday Follow-up by: [name] MD,  December 19, 2009 2:17 PM  Additional Follow-up for Phone Call Additional follow up Details #1::        Phone Call Completed Additional Follow-up by: Rudy Jew, RN,  December 19, 2009 2:28 PM

## 2011-01-11 NOTE — Assessment & Plan Note (Signed)
Summary: PILL DYSPHAGIA/YF    History of Present Illness Visit Type: Follow-up Consult Primary GI MD: [name] FACP FAGA Primary Provider: [name], MD Requesting Provider: [name], MD Chief Complaint: dysphagia: patient tries to swallow pill and it does not go down, coughs it back up History of Present Illness:   77 year old Caucasian male with chronic atrial fibrillation requiring chronic anticoagulation. I have been following him for several years because of bacterial overgrowth syndrome which has been responsive to nonabsorbable antibiotics. He has had a previous right carotid artery dissection in 2002, stable cerebral aneurysm, hypertension, and BPH.  He now presents with several months of progressive dysphagia for pills and his upper retropharyngeal area. He denies GERD, solid food dysphagia, aspiration, coughing or hoarseness. He is on regular Prilosec for reasons which are unclear. Last endoscopic exam was 10 years ago.Review of his extensive records shows an IgA deficiency recently determined. Other celiac antibody she had a low IgG anti-gliadin antibody making celiac disease unlikely. Also he currently is on Benefiber and probiotics. Currently, his diarrhea is under control. He recently has completed treatment for bacterial overgrowth syndrome.   GI Review of Systems    Reports acid reflux and  dysphagia with solids.      Denies abdominal pain, belching, bloating, chest pain, dysphagia with liquids, heartburn, loss of appetite, nausea, vomiting, vomiting blood, weight loss, and  weight gain.      Reports constipation, diarrhea, hemorrhoids, and  rectal bleeding.     Denies anal fissure, black tarry stools, change in bowel habit, diverticulosis, fecal incontinence, heme positive stool, irritable bowel syndrome, jaundice, light color stool, liver problems, and  rectal pain.    Current Medications (verified): 1)  Warfarin Sodium 5 Mg Tabs (Warfarin Sodium) .... Qd  As Directed Except 2.5 Mg T-Th 2)  Caltrate 600+d 600-400 Mg-Unit  Tabs (Calcium Carbonate-Vitamin D) .... One Tab Two Times A Day 3)  Aspirin 81 Mg  Tbec (Aspirin) .... Once Daily 4)  Zyrtec Allergy 10 Mg Tabs (Cetirizine Hcl) .... Once Daily As Needed 5)  Fish Oil 1200 Mg Caps (Omega-3 Fatty Acids) .... Take 1 Tablet By Mouth Once A Day 6)  Veramyst 27.5 Mcg/spray Susp (Fluticasone Furoate) .... As Needed 7)  Ditropan Xl 5 Mg Xr24h-Tab (Oxybutynin Chloride) .... Take One Tablet Once A Day 8)  Omeprazole 20 Mg Tbec (Omeprazole) .... Take One Prior To Breakfast Every Other Day 9)  Multivitamins   Tabs (Multiple Vitamin) .... One Tablet By Mouth Once Daily 10)  Mag-Ox 400 400 Mg Tabs (Magnesium Oxide) .... Once Daily 11)  Align  Caps (Probiotic Product) .... Once Daily 12)  Benefiber  Powd (Wheat Dextrin) .... Once Daily  Allergies (verified): 1)  ! Biaxin 2)  ! Epinephrine 3)  ! Nimodipine (Nimodipine)  Past History:  Past medical, surgical, family and social histories (including risk factors) reviewed for relevance to current acute and chronic problems.  Past Medical History: Reviewed history from 07/24/2010 and no changes required.  1. Atrial fibrillation      a. Diagnosed in January 2007. Echo January 2007           normal ejection fraction, no significant valvular disease.      b.     Adenosine Cardiolite January 2007 no evidence of ischemia.      c.     A 48-hour Holter monitor in July 2007 showed good rate           control with chronic atrial fibrillation.  d. Cath 1/11: Normal cors. EF 45%      e. Echo  2/11: 60-65%  2. 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  3. Obstructive sleep apnea noncompliant with CPAP.  4. Cerebral aneurysm followed by Dr. Corliss Skains.  5. History of chest pain.      a.     Cardiac catheterization 1999 which was normal.      b.     Myoview 2007. normal      c.    Cardiac cath Jan 2011. Normal cors EF 45%  6.  Hypertension.  7. Obesity.  8. Fatigue.  9. Allergic rhinitis 10. Colonic polyps, hx of,diverticulosis-last colon 2009 11. Hyperlipidemia 12. Diabetes/Elevated Glucose 13. 3rd nerve palsy 14. Retinal vein occlusion 15. Benign prostatic hypertrophy 16. Irritable Bowel Syndrome  Past Surgical History: Reviewed history from 06/27/2010 and no changes required. Cholecystectomy Inguinal and umbilical herniorrhaphy T&A  as child Cardiac Cath-1999 Colonoscopy-12/01/2003,4/09  Family History: Reviewed history from 11/22/2009 and no changes required. Lung Cancer: Father Family History of Colon Cancer:Maternal grandmother Family History of Diabetes: Son  Social History: Reviewed history from 07/23/2007 and no changes required. Former Smoker Retired Married Alcohol use-yes  Review of Systems       The patient complains of allergy/sinus, fatigue, heart rhythm changes, and urination - excessive.  The patient denies anemia, anxiety-new, arthritis/joint pain, back pain, blood in urine, breast changes/lumps, change in vision, confusion, cough, coughing up blood, depression-new, fainting, fever, headaches-new, hearing problems, heart murmur, itching, menstrual pain, muscle pains/cramps, night sweats, nosebleeds, pregnancy symptoms, shortness of breath, skin rash, sleeping problems, sore throat, swelling of feet/legs, swollen lymph glands, thirst - excessive , urination - excessive , urination changes/pain, urine leakage, vision changes, and voice change.    Vital Signs:  Patient profile:   77 year old male Height:      70 inches Weight:      191.38 pounds BMI:     27.56 Pulse rate:   68 / minute Pulse rhythm:   regular BP sitting:   90 / 60  (left arm) Cuff size:   regular  Vitals Entered By: June McMurray CMA Duncan Dull) (August 08, 2010 9:48 AM)  Physical Exam  General:  Well developed, well nourished, no acute distress.healthy appearing.   Head:  Normocephalic and atraumatic. Eyes:   PERRLA, no icterus.exam deferred to patient's ophthalmologist.   Mouth:  No deformity or lesions, dentition normal. Neck:  Supple; no masses or thyromegaly.No carotid bruits noted. Lungs:  Clear throughout to auscultation. Heart:  Irregular, irregular rhythm with controlled heart rate and soft murmur noted. Abdomen:  Soft, nontender and nondistended. No masses, hepatosplenomegaly or hernias noted. Normal bowel sounds. Msk:  Symmetrical with no gross deformities. Normal posture. Extremities:  No clubbing, cyanosis, edema or deformities noted. Neurologic:  Alert and  oriented x4;  grossly normal neurologically. Cervical Nodes:  No significant cervical adenopathy. Psych:  Alert and cooperative. Normal mood and affect.   Impression & Recommendations:  Problem # 1:  DYSPHAGIA (ICD-787.29) Assessment New Probable mild nonspecific cricopharyngeal neuromuscular dysfunction perhaps related to previous neurological event. He has had dissection of his right carotid artery and apparently has a cerebral aneurysm. He gives a history of no other neurological or neuromuscular problems, and physical exam is fairly unremarkable. I have elected to proceed with endoscopic exam to visualize his hypopharyngeal area, vocal cords, and his esophagus. At the time of endoscopy, we also will obtain small bowel biopsy  to exclude celiac disease.  Problem # 2:  SLEEP APNEA (ICD-780.57) Assessment: Unchanged  Problem # 3:  CORONARY ARTERY DISEASE (ICD-414.00) Assessment: Improved Continue multiple meds Per Dr. Cato Mulligan. I see no reason to stop his warfarin for his endoscopic procedure.  Problem # 4:  IRRITABLE BOWEL SYNDROME (ICD-564.1) Assessment: Improved  Problem # 5:  DIABETES MELLITUS, TYPE II (ICD-250.00) Assessment: Improved He Is Not on medications at this time.  Patient Instructions: 1)  Copy sent to : Dr. [name] 2)  Please continue current medications.  3)  Conscious Sedation brochure given.  4)   Upper Endoscopy with Dilatation brochure given.   Appended Document: PILL DYSPHAGIA/YF    Clinical Lists Changes  Orders: Added new Test order of EGD (EGD) - Signed

## 2011-01-11 NOTE — Letter (Signed)
Summary: [facility]  [facility]   Imported By: Maryln Gottron 09/14/2010 11:16:03  _____________________________________________________________________  External Attachment:    Type:   Image     Comment:   External Document

## 2011-01-11 NOTE — Assessment & Plan Note (Signed)
Summary: PT/RCD   Nurse Visit   Allergies: 1)  ! Biaxin 2)  ! Epinephrine 3)  ! Nimodipine (Nimodipine) Laboratory Results   Blood Tests      INR: 2.3   (Normal Range: 0.88-1.12   Therap INR: 2.0-3.5) Comments: [name]  May 15, 2010 10:05 AM      Orders Added: 1)  Est. Patient Level I [99211] 2)  Protime [16109UE]   ANTICOAGULATION RECORD PREVIOUS REGIMEN & LAB RESULTS Anticoagulation Diagnosis:  v58.83,v58.61,427.31 on  07/14/2007 Previous INR Goal Range:  2.0-3.0 on  05/12/2008 Previous INR:  3.8 on  04/12/2010 Previous Coumadin Dose(mg):  2.5 Tues,Thurs others 5mg  on  09/15/2009 Previous Regimen:  hold 2 days then resume on  04/12/2010 Previous Coagulation Comments:  Dr. Lovell Sheehan approved on  04/12/2010  NEW REGIMEN & LAB RESULTS Current INR: 2.3 Regimen: same  Repeat testing in: 4 weeks  Anticoagulation Visit Questionnaire Coumadin dose missed/changed:  No Abnormal Bleeding Symptoms:  No  Any diet changes including alcohol intake, vegetables or greens since the last visit:  No Any illnesses or hospitalizations since the last visit:  No Any signs of clotting since the last visit (including chest discomfort, dizziness, shortness of breath, arm tingling, slurred speech, swelling or redness in leg):  No  MEDICATIONS WARFARIN SODIUM 5 MG TABS (WARFARIN SODIUM) qd as directed except 2.5 mg T-TH CALTRATE 600+D 600-400 MG-UNIT  TABS (CALCIUM CARBONATE-VITAMIN D) one tab two times a day ASPIRIN 81 MG  TBEC (ASPIRIN) once daily ZYRTEC ALLERGY 10 MG TABS (CETIRIZINE HCL) once daily as needed FISH OIL 1200 MG CAPS (OMEGA-3 FATTY ACIDS) Take 1 tablet by mouth once a day VERAMYST 27.5 MCG/SPRAY SUSP (FLUTICASONE FUROATE) as needed DITROPAN XL 5 MG XR24H-TAB (OXYBUTYNIN CHLORIDE) Take one tablet once a day OMEPRAZOLE 20 MG TBEC (OMEPRAZOLE) Take one prior to breakfast every other day MULTIVITAMINS   TABS (MULTIPLE VITAMIN) one tablet by mouth once  daily PHILL[facility]  CAPS (PROBIOTIC PRODUCT) once daily

## 2011-01-12 ENCOUNTER — Ambulatory Visit: Admit: 2011-01-12 | Payer: Self-pay | Admitting: Internal Medicine

## 2011-01-12 ENCOUNTER — Other Ambulatory Visit (INDEPENDENT_AMBULATORY_CARE_PROVIDER_SITE_OTHER): Payer: PRIVATE HEALTH INSURANCE | Admitting: Internal Medicine

## 2011-01-12 DIAGNOSIS — Z7901 Long term (current) use of anticoagulants: Secondary | ICD-10-CM

## 2011-01-12 LAB — POCT INR: INR: 2.1

## 2011-01-17 NOTE — Assessment & Plan Note (Signed)
Summary: 2 wk/protime/njr   Vital Signs:  Patient profile:   77 year old male Weight:      196 pounds Temp:     98.1 degrees F oral Pulse rate:   76 / minute Pulse rhythm:   regular BP sitting:   134 / 86  (left arm) Cuff size:   regular  Vitals Entered By: Alfred Levins, CMA (December 29, 2010 11:12 AM) CC: f/u on diarrhea   Primary Care Provider:  [name], MD  CC:  f/u on diarrhea.  History of Present Illness: has intermittent loose stools--- has had several loose bms in the past 2 weeks. about 3-4 times weekly he will have a very loose stool...has had times when he has not been able to control BMs (incontinence). has spots of blood on tissue paper.   no pain except iritated rectum  All other systems reviewed and were negative   Current Medications (verified): 1)  Warfarin Sodium 5 Mg Tabs (Warfarin Sodium) .... Qd As Directed Except 2.5 Mg T-Th 2)  Aspirin 81 Mg  Tbec (Aspirin) .... Once Daily  Allergies (verified): 1)  ! Biaxin 2)  ! Epinephrine 3)  ! Nimodipine (Nimodipine)  Past History:  Past Medical History: Last updated: 07/24/2010  1. Atrial fibrillation      a. Diagnosed in January 2007. Echo January 2007           normal ejection fraction, no significant valvular disease.      b.     Adenosine Cardiolite January 2007 no evidence of ischemia.      c.     A 48-hour Holter monitor in July 2007 showed good rate           control with chronic atrial fibrillation.      d. Cath 1/11: Normal cors. EF 45%      e. Echo  2/11: 60-65%  2. 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  3. Obstructive sleep apnea noncompliant with CPAP.  4. Cerebral aneurysm followed by Dr. Corliss Skains.  5. History of chest pain.      a.     Cardiac catheterization 1999 which was normal.      b.     Myoview 2007. normal      c.    Cardiac cath Jan 2011. Normal cors EF 45%  6. Hypertension.  7. Obesity.  8. Fatigue.  9. Allergic rhinitis 10.  Colonic polyps, hx of,diverticulosis-last colon 2009 11. Hyperlipidemia 12. Diabetes/Elevated Glucose 13. 3rd nerve palsy 14. Retinal vein occlusion 15. Benign prostatic hypertrophy 16. Irritable Bowel Syndrome  Past Surgical History: Last updated: 06/27/2010 Cholecystectomy Inguinal and umbilical herniorrhaphy T&A  as child Cardiac Cath-1999 Colonoscopy-12/01/2003,4/09  Family History: Last updated: 11/22/2009 Lung Cancer: Father Family History of Colon Cancer:Maternal grandmother Family History of Diabetes: Son  Social History: Last updated: 07/23/2007 Former Smoker Retired Married Alcohol use-yes  Risk Factors: Smoking Status: quit > 6 months (07/31/2010)  Physical Exam  General:   well-developed male no acute distress. neck supple. Dermatologic exam without jaundice.   Impression & Recommendations:  Problem # 1:  IRRITABLE BOWEL SYNDROME (ICD-564.1)  i still think this is the most likely diagnosis .  will d/c dairly products add fibercon immodium as needed   Orders: Gastroenterology Referral (GI)  Complete Medication List: 1)  Warfarin Sodium 5 Mg Tabs (Warfarin sodium) .... Qd as directed except 2.5 mg t-th 2)  Aspirin 81 Mg Tbec (Aspirin) .... Once  daily 3)  Fibercon 625 Mg Tabs (Calcium polycarbophil) .... 3 by mouth once daily 4)  Lomotil 2.5-0.025 Mg Tabs (Diphenoxylate-atropine) .Marland Kitchen.. 1po once daily.  Other Orders: Protime (16109UE) Fingerstick ([id number]) Prescriptions: LOMOTIL 2.5-0.025 MG TABS (DIPHENOXYLATE-ATROPINE) 1po once daily.  #30 x 1   Entered and Authorized by:   [name] MD   Signed by:   [name] MD on 12/29/2010   Method used:   Print then Give to Patient   RxID:   [id number]    Orders Added: 1)  Est. Patient Level III [86578] 2)  Protime [46962XB] 3)  Fingerstick [28413] 4)  Gastroenterology Referral [GI]     ANTICOAGULATION RECORD PREVIOUS REGIMEN & LAB RESULTS Anticoagulation Diagnosis:   v58.83,v58.61,427.31 on  07/14/2007 Previous INR Goal Range:  2.0-3.0 on  05/12/2008 Previous INR:  2.5 on  11/27/2010 Previous Coumadin Dose(mg):  2.5 Tues,Thurs others 5mg  on  09/15/2009 Previous Regimen:  same on  11/27/2010 Previous Coagulation Comments:  Dr. Lovell Sheehan approved on  04/12/2010  NEW REGIMEN & LAB RESULTS Current INR: 1.9 Regimen: 2.5mg . Tues only 5mg . all other days  Repeat testing in: 2 weeks  Anticoagulation Visit Questionnaire Coumadin dose missed/changed:  No Abnormal Bleeding Symptoms:  No  Any diet changes including alcohol intake, vegetables or greens since the last visit:  No Any illnesses or hospitalizations since the last visit:  No Any signs of clotting since the last visit (including chest discomfort, dizziness, shortness of breath, arm tingling, slurred speech, swelling or redness in leg):  No  MEDICATIONS WARFARIN SODIUM 5 MG TABS (WARFARIN SODIUM) qd as directed except 2.5 mg T-TH ASPIRIN 81 MG  TBEC (ASPIRIN) once daily FIBERCON 625 MG TABS (CALCIUM POLYCARBOPHIL) 3 by mouth once daily LOMOTIL 2.5-0.025 MG TABS (DIPHENOXYLATE-ATROPINE) 1po once daily.    Laboratory Results   Blood Tests      INR: 1.9   (Normal Range: 0.88-1.12   Therap INR: 2.0-3.5) Comments: [name]  December 29, 2010 11:51 AM

## 2011-01-23 ENCOUNTER — Other Ambulatory Visit: Payer: Self-pay | Admitting: Gastroenterology

## 2011-01-25 ENCOUNTER — Other Ambulatory Visit (INDEPENDENT_AMBULATORY_CARE_PROVIDER_SITE_OTHER): Payer: PRIVATE HEALTH INSURANCE | Admitting: Internal Medicine

## 2011-01-25 DIAGNOSIS — T887XXA Unspecified adverse effect of drug or medicament, initial encounter: Secondary | ICD-10-CM

## 2011-01-25 DIAGNOSIS — E119 Type 2 diabetes mellitus without complications: Secondary | ICD-10-CM

## 2011-01-25 DIAGNOSIS — E785 Hyperlipidemia, unspecified: Secondary | ICD-10-CM

## 2011-01-25 LAB — LIPID PANEL
Cholesterol: 184 mg/dL (ref 0–200)
HDL: 47 mg/dL (ref >39.00)

## 2011-01-25 LAB — HEPATIC FUNCTION PANEL
ALT: 13 U/L (ref 0–53)
Bilirubin, Direct: 0.1 mg/dL (ref 0.0–0.3)
Total Protein: 6.2 g/dL (ref 6.0–8.3)

## 2011-01-25 LAB — BASIC METABOLIC PANEL
BUN: 11 mg/dL (ref 6–23)
Creatinine, Ser: 1 mg/dL (ref 0.4–1.5)
GFR: 79.17 mL/min (ref >60.00)
Potassium: 4.9 mEq/L (ref 3.5–5.1)

## 2011-01-25 LAB — HEMOGLOBIN A1C: Hgb A1c MFr Bld: 5.8 % (ref 4.6–6.5)

## 2011-01-31 ENCOUNTER — Encounter: Payer: Self-pay | Admitting: Internal Medicine

## 2011-02-01 ENCOUNTER — Ambulatory Visit: Payer: Self-pay | Admitting: Internal Medicine

## 2011-02-05 ENCOUNTER — Encounter: Payer: Self-pay | Admitting: Internal Medicine

## 2011-02-05 ENCOUNTER — Ambulatory Visit (INDEPENDENT_AMBULATORY_CARE_PROVIDER_SITE_OTHER): Payer: PRIVATE HEALTH INSURANCE | Admitting: Internal Medicine

## 2011-02-05 DIAGNOSIS — I10 Essential (primary) hypertension: Secondary | ICD-10-CM

## 2011-02-05 DIAGNOSIS — I2510 Atherosclerotic heart disease of native coronary artery without angina pectoris: Secondary | ICD-10-CM

## 2011-02-05 DIAGNOSIS — E785 Hyperlipidemia, unspecified: Secondary | ICD-10-CM

## 2011-02-05 DIAGNOSIS — Z7901 Long term (current) use of anticoagulants: Secondary | ICD-10-CM

## 2011-02-05 DIAGNOSIS — D649 Anemia, unspecified: Secondary | ICD-10-CM

## 2011-02-05 DIAGNOSIS — E119 Type 2 diabetes mellitus without complications: Secondary | ICD-10-CM

## 2011-02-05 DIAGNOSIS — I4891 Unspecified atrial fibrillation: Secondary | ICD-10-CM

## 2011-02-05 LAB — POCT INR: INR: 2.2

## 2011-02-05 NOTE — Assessment & Plan Note (Signed)
Controlled No meds

## 2011-02-05 NOTE — Assessment & Plan Note (Signed)
No sxs, No further evaluation necessary

## 2011-02-05 NOTE — Progress Notes (Signed)
  Subjective:    Patient ID: Maurice Wood, male    DOB: 05/04/1933, 77 y.o.   MRN: [id number]  HPI  patient comes in for followup of multiple medical problems including type 2 diabetes, hyperlipidemia, hypertension. The patient does not check blood sugar or blood pressure at home. The patetient does not follow an exercise or diet program. The patient denies any polyuria, polydipsia.  In the past the patient has gone to diabetic treatment center. The patient is tolerating medications  Without difficulty. The patient does admit to medication compliance.   Past Medical History  Diagnosis Date  . Atrial fibrillation Jan 2007    echo 1-07 normal ejection fraction, no significant valvular disease. adenosine cardiolite 1-07  no evidence of ischemia. A 48 hour Holter monitor in 7-07 showed good rate controlw/ chronic atrial fibrillation. Cath 1/11 normal cors. EF 45%. Echo 2-11 60-65%  . Obstructive sleep apnea     noncompliant with CPAP  . Cerebral aneurysm     followed by Dr Corliss Skains  . History of chest pain     Cardia catheterization 1999 was normal. myoview 2007 normal. cardiac cath jan 2011 normal cors EF 45%  . Obesity   . Hypertension   . Fatigue   . Allergy     rhinitis  . Hx of colonic polyps     diverticulosis  . Hyperlipidemia   . Diabetes mellitus     Elevated glucose  . Retinal vein occlusion   . Benign prostatic hypertrophy   . IBS (irritable bowel syndrome)    Past Surgical History  Procedure Date  . Cholecystectomy   . Inguinal and umbilical herniorrhaphy   . Tonsillectomy and adenoidectomy as a child  . Cardiac catheterization 1999    reports that he has quit smoking. He does not have any smokeless tobacco history on file. He reports that he drinks alcohol. His drug history not on file. family history includes Cancer in his father and maternal grandmother and Diabetes in his son.    Allergies  Allergen Reactions  . Clarithromycin     REACTION: Headache  .  Epinephrine     REACTION: Increased Heart Rate  . Nimodipine     REACTION: Flushing      Review of Systems  patient denies chest pain, shortness of breath, orthopnea. Denies lower extremity edema, abdominal pain, change in appetite, change in bowel movements. Patient denies rashes, musculoskeletal complaints. No other specific complaints in a complete review of systems.      Objective:   Physical Exam  well-developed well-nourished male in no acute distress. HEENT exam atraumatic, normocephalic, neck supple without jugular venous distention. Chest clear to auscultation cardiac exam S1-S2 are irregular. Abdominal exam overweight with bowel sounds, soft and nontender. Extremities no edema. Neurologic exam is alert with a normal gait.        Assessment & Plan:

## 2011-02-05 NOTE — Assessment & Plan Note (Signed)
No sxs No bleeding complications on warfarin

## 2011-02-05 NOTE — Assessment & Plan Note (Signed)
Being evaluated by GI ([facility])

## 2011-02-05 NOTE — Assessment & Plan Note (Signed)
Controlled and on no meds

## 2011-02-05 NOTE — Assessment & Plan Note (Addendum)
Lab Results  Component Value Date   HGBA1C 5.8 01/25/2011   CREATININE 1.0 01/25/2011   CHOL 184 01/25/2011   LDL 86 11/29/2009   HDL 47.00 01/25/2011   TRIG 226.0* 01/25/2011    Last eye exam and foot exam:    Component Value Date/Time   HMDIABEYEEXA mccuen- every 6 months 12/10/2010    Assessment: Diabetes control: controlled Progress toward goals: at goal Barriers to meeting goals: no barriers identified  Plan: Diabetes treatment: continue current medications Refer to: none Instruction/counseling given: discussed foot care, discussed the need for weight loss and discussed diet

## 2011-02-09 ENCOUNTER — Ambulatory Visit: Payer: PRIVATE HEALTH INSURANCE

## 2011-02-20 NOTE — Letter (Signed)
Summary: Dismissal Activation Form, Return Reciept  Dismissal Activation Form, Return Reciept   Imported By: Maryln Gottron 02/13/2011 15:27:59  _____________________________________________________________________  External Attachment:    Type:   Image     Comment:   External Document

## 2011-02-25 LAB — COMPREHENSIVE METABOLIC PANEL
ALT: 16 U/L (ref 0–53)
AST: 22 U/L (ref 0–37)
CO2: 23 mEq/L (ref 19–32)
Calcium: 9.2 mg/dL (ref 8.4–10.5)
GFR calc Af Amer: >60 mL/min (ref >60)
Sodium: 138 mEq/L (ref 135–145)
Total Protein: 6.9 g/dL (ref 6.0–8.3)

## 2011-02-25 LAB — DIFFERENTIAL
Eosinophils Absolute: 0.2 10*3/uL (ref 0.0–0.7)
Eosinophils Relative: 3 % (ref 0–5)
Lymphs Abs: 1.7 10*3/uL (ref 0.7–4.0)
Monocytes Relative: 7 % (ref 3–12)

## 2011-02-25 LAB — PROTIME-INR
INR: 1.24 (ref 0.00–1.49)
INR: 2.04 — ABNORMAL HIGH (ref 0.00–1.49)
Prothrombin Time: 15.5 seconds — ABNORMAL HIGH (ref 11.6–15.2)
Prothrombin Time: 22.9 seconds — ABNORMAL HIGH (ref 11.6–15.2)

## 2011-02-25 LAB — BASIC METABOLIC PANEL
Chloride: 108 mEq/L (ref 96–112)
Creatinine, Ser: 0.88 mg/dL (ref 0.4–1.5)
GFR calc Af Amer: >60 mL/min (ref >60)
Potassium: 4.2 mEq/L (ref 3.5–5.1)

## 2011-02-25 LAB — POCT CARDIAC MARKERS
CKMB, poc: 1.1 ng/mL (ref 1.0–8.0)
Myoglobin, poc: 67.1 ng/mL (ref 12–200)
Myoglobin, poc: 84.2 ng/mL (ref 12–200)

## 2011-02-25 LAB — LIPID PANEL
Cholesterol: 166 mg/dL (ref 0–200)
Total CHOL/HDL Ratio: 4.3 RATIO

## 2011-02-25 LAB — CARDIAC PANEL(CRET KIN+CKTOT+MB+TROPI)
CK, MB: 2.2 ng/mL (ref 0.3–4.0)
CK, MB: 2.5 ng/mL (ref 0.3–4.0)
Relative Index: INVALID (ref 0.0–2.5)
Troponin I: 0.03 ng/mL (ref 0.00–0.06)
Troponin I: 0.06 ng/mL (ref 0.00–0.06)

## 2011-02-25 LAB — CBC
MCHC: 34.1 g/dL (ref 30.0–36.0)
RBC: 5.1 MIL/uL (ref 4.22–5.81)

## 2011-02-26 ENCOUNTER — Other Ambulatory Visit: Payer: Self-pay | Admitting: Gastroenterology

## 2011-02-26 DIAGNOSIS — R197 Diarrhea, unspecified: Secondary | ICD-10-CM

## 2011-02-26 DIAGNOSIS — R634 Abnormal weight loss: Secondary | ICD-10-CM

## 2011-02-28 ENCOUNTER — Ambulatory Visit
Admission: RE | Admit: 2011-02-28 | Discharge: 2011-02-28 | Disposition: A | Discharge status: Home / Self Care | Payer: Medicare Other | Source: Ambulatory Visit | Attending: Gastroenterology | Admitting: Gastroenterology

## 2011-02-28 DIAGNOSIS — R197 Diarrhea, unspecified: Secondary | ICD-10-CM

## 2011-02-28 DIAGNOSIS — R634 Abnormal weight loss: Secondary | ICD-10-CM

## 2011-02-28 MED ORDER — IOHEXOL 300 MG/ML  SOLN
125.0000 mL | Freq: Once | INTRAMUSCULAR | Status: AC | PRN
  Administered 2011-02-28: 125 mL via INTRAVENOUS

## 2011-03-06 ENCOUNTER — Ambulatory Visit (INDEPENDENT_AMBULATORY_CARE_PROVIDER_SITE_OTHER): Payer: Medicare Other | Admitting: Internal Medicine

## 2011-03-06 DIAGNOSIS — Z7901 Long term (current) use of anticoagulants: Secondary | ICD-10-CM

## 2011-03-06 NOTE — Patient Instructions (Signed)
2.5mg . Tuesday only all other days 5mg .

## 2011-03-13 ENCOUNTER — Telehealth: Payer: Self-pay | Admitting: *Deleted

## 2011-03-13 DIAGNOSIS — J302 Other seasonal allergic rhinitis: Secondary | ICD-10-CM

## 2011-03-13 MED ORDER — FLUTICASONE PROPIONATE 50 MCG/ACT NA SUSP: 2.0000 | Freq: Every day | NASAL | Status: DC

## 2011-03-13 NOTE — Telephone Encounter (Signed)
Pt is taking Veramyst and wants something different called into [facility]

## 2011-03-13 NOTE — Telephone Encounter (Signed)
Dc veramyst. flonase nasal spray- 1 spray each nostril daily

## 2011-03-13 NOTE — Telephone Encounter (Signed)
rx sent in electronically, pt aware 

## 2011-03-27 ENCOUNTER — Ambulatory Visit (INDEPENDENT_AMBULATORY_CARE_PROVIDER_SITE_OTHER): Payer: Medicare Other | Admitting: Internal Medicine

## 2011-03-27 ENCOUNTER — Telehealth: Payer: Self-pay | Admitting: *Deleted

## 2011-03-27 ENCOUNTER — Encounter: Payer: Self-pay | Admitting: Internal Medicine

## 2011-03-27 DIAGNOSIS — S0510XA Contusion of eyeball and orbital tissues, unspecified eye, initial encounter: Secondary | ICD-10-CM

## 2011-03-27 DIAGNOSIS — Z7901 Long term (current) use of anticoagulants: Secondary | ICD-10-CM

## 2011-03-27 DIAGNOSIS — S0010XA Contusion of unspecified eyelid and periocular area, initial encounter: Secondary | ICD-10-CM

## 2011-03-27 NOTE — Telephone Encounter (Signed)
[facility] Triage Call Report Triage Record Num: [id number] Operator: Martie Lee Long Patient Name: Maurice Wood Call Date & Time: 03/26/2011 6:38:45PM Patient Phone: [phone] PCP: Valetta Mole. Swords Patient Gender: Male PCP Fax : [phone] Patient DOB: [birth year] Practice Name: [facility] Reason for Call: Mary/Mother and Rayjon/Patient calling about a purple area under his left eye with swelling. Pt hit his eye 2 weeks ago but the bruise has cleared up. The current bruise has gotten slightly larger. Pt has not injured eye recently. Onset 03/26/11. Pt is taking Coumadin. Pt has no visual changes but because of the swelling he cannot see as well from the left eye. Instructed to be seen by Provider within 24hrs. Protocol(s) Used: Eye: Injury Recommended Outcome per Protocol: See Provider within 24 hours Reason for Outcome: Bruising OR mild swelling to orbit, cheek or forehead AND no changes in vision Care Advice: ~ Sleep with the head up on 2-3 pillows to reduce swelling and relieve pain. Apply a cloth-covered ice pack to the eye area for 15-20 minutes every 2-3 hours during the first 24 hours to reduce swelling and discomfort. After 24 hours, warm moist compresses may be helpful. ~ ~ Call provider if symptoms worsen or new symptoms develop. ~ See provider immediately if you note increasing pain, any change in vision, or light sensitivity. ~ SYMPTOM / CONDITION MANAGEMENT 03/26/2011 6:53:34PM Page 1 of 1 [email or web address]

## 2011-03-27 NOTE — Progress Notes (Signed)
  Subjective:    Patient ID: Maurice Wood, male    DOB: 04/28/1933, 77 y.o.   MRN: [id number]  HPI  Bruising around left eye. Started with a small bruise. Has now extended to lower eye lid.  INR today 2.4 Patient denies any change in vision. No ocular pain.  Past Medical History  Diagnosis Date  . Atrial fibrillation Jan 2007    echo 1-07 normal ejection fraction, no significant valvular disease. adenosine cardiolite 1-07  no evidence of ischemia. A 48 hour Holter monitor in 7-07 showed good rate controlw/ chronic atrial fibrillation. Cath 1/11 normal cors. EF 45%. Echo 2-11 60-65%  . Obstructive sleep apnea     noncompliant with CPAP  . Cerebral aneurysm     followed by Dr Corliss Skains  . History of chest pain     Cardia catheterization 1999 was normal. myoview 2007 normal. cardiac cath jan 2011 normal cors EF 45%  . Obesity   . Hypertension   . Fatigue   . Allergy     rhinitis  . Hx of colonic polyps     diverticulosis  . Hyperlipidemia   . Diabetes mellitus     Elevated glucose  . Retinal vein occlusion   . Benign prostatic hypertrophy   . IBS (irritable bowel syndrome)    Past Surgical History  Procedure Date  . Cholecystectomy   . Inguinal and umbilical herniorrhaphy   . Tonsillectomy and adenoidectomy as a child  . Cardiac catheterization 1999    reports that he has quit smoking. He does not have any smokeless tobacco history on file. He reports that he drinks alcohol. His drug history not on file. family history includes Cancer in his father and maternal grandmother and Diabetes in his son. Allergies  Allergen Reactions  . Clarithromycin     REACTION: Headache  . Epinephrine     REACTION: Increased Heart Rate  . Nimodipine     REACTION: Flushing    Review of Systems      patient denies chest pain, shortness of breath, orthopnea. Denies lower extremity edema, abdominal pain, change in appetite, change in bowel movements. Patient denies rashes, musculoskeletal  complaints. No other specific complaints in a complete review of systems.  Objective:   Physical Exam Well-developed, overweight male in no acute distress. HEENT exam atraumatic, normocephalic except for ecchymosis around the left eye extending 2 cm below the left eyelid. No pain to palpation.       Assessment & Plan:  Ecchymosis around left eye, spontaneous. Exacerbated by warfarin. INR is 2.4 today. He will hold warfarin for 2 days and resume on Thursday. He'll call me for any change in vision or any other concerns.

## 2011-04-02 ENCOUNTER — Other Ambulatory Visit: Payer: Self-pay | Admitting: Neurosurgery

## 2011-04-02 DIAGNOSIS — I729 Aneurysm of unspecified site: Secondary | ICD-10-CM

## 2011-04-03 ENCOUNTER — Ambulatory Visit: Payer: Medicare Other

## 2011-04-12 ENCOUNTER — Other Ambulatory Visit: Payer: Medicare Other

## 2011-04-23 ENCOUNTER — Ambulatory Visit
Admission: RE | Admit: 2011-04-23 | Discharge: 2011-04-23 | Disposition: A | Discharge status: Home / Self Care | Payer: Medicare Other | Source: Ambulatory Visit | Attending: Neurosurgery | Admitting: Neurosurgery

## 2011-04-23 DIAGNOSIS — I729 Aneurysm of unspecified site: Secondary | ICD-10-CM

## 2011-04-23 MED ORDER — GADOBENATE DIMEGLUMINE 529 MG/ML IV SOLN
18.0000 mL | Freq: Once | INTRAVENOUS | Status: AC | PRN
  Administered 2011-04-23: 18 mL via INTRAVENOUS

## 2011-04-24 ENCOUNTER — Ambulatory Visit (INDEPENDENT_AMBULATORY_CARE_PROVIDER_SITE_OTHER): Payer: Medicare Other | Admitting: Internal Medicine

## 2011-04-24 DIAGNOSIS — Z7901 Long term (current) use of anticoagulants: Secondary | ICD-10-CM

## 2011-04-24 NOTE — Assessment & Plan Note (Signed)
[facility]                            CARDIOLOGY OFFICE NOTE   Maurice Wood, Maurice Wood                         MRN:          [id number]  DATE:12/18/2007                            DOB:          06-02-1933    PRIMARY CARE PHYSICIAN:  Maurice Mole. Swords, MD   INTRODUCTION:  Maurice Wood is a very pleasant 77 year old male with a  history of chronic atrial fibrillation, obstructive sleep apnea  (noncompliance with CPAP), chest pain with normal coronaries by  catheterization in 1999, hypertension, and a history of spontaneous  dissection of the right internal carotid artery in 2002, who returns  today for routine followup.   We saw him in September and he was somewhat bradycardic and blood  pressure was on the low side.  We actually discontinued his digoxin and  considered decreasing his diltiazem, but did not do this.  He says that  his heart rate has been much better.  However, he still has spells of  orthostasis where he has to sit down abruptly to prevent from passing  out.  He denies any chest pain or shortness of breath.  His main  complaint is really one of fatigue.  He says he just does not have the  energy he used to.  He is not wearing his CPAP.  It is difficult for  him.  He does not exercise significantly.  His wife and him are asking  about potential therapies for atrial fibrillation.   MEDICATIONS:  1. Prilosec 20 a day.  2. Diltiazem 120 a day.  3. Multivitamin.  4. Calcium.  5. Flomax 0.4 a day.  6. Warfarin.  7. Aspirin 81.  8. Vesicare.   PHYSICAL EXAMINATION:  On physical examination he is well-appearing, in  no acute distress.  Ambulates around the clinic without any respiratory  difficulty.  Blood pressure was 102/72 lying.  On sitting it was 101/70,  and standing it went down to 94/62.  Heart rate rose to 109, so he was  just minimally orthostatic.  HEENT:  Normal.  NECK:  Supple.  There is no obvious JVD.  Carotids are 2+ on the  left,  1+ on the right without bruits.  There is no lymphadenopathy or  thyromegaly.  CARDIAC:  PMI is nondisplaced.  It is irregular.  No obvious murmurs.  LUNGS:  Clear.  ABDOMEN:  Soft, nontender, nondistended.  There is no  hepatosplenomegaly, no bruits, no masses.  Good bowel sounds.  EXTREMITIES:  Warm.  No cyanosis, clubbing, or edema.  No rash.  NEUROLOGIC:  Alert and oriented x3.  Cranial nerves 2-12 were intact.  Moves all 4 extremities without difficulty.  Affect is pleasant.  There  is no pronator drift.  Finger-to-nose examination is normal.  Negative  Romberg.   EKG shows atrial fibrillation with a ventricular response of 88 beats  per minute.  No significant ST-T wave abnormalities.   ASSESSMENT AND PLAN:  1. Fatigue.  I suspect this is multifactorial.  The question is how      much of a role is his atrial  fibrillation playing here.  We did      have a long talk about the possible therapies for atrial      fibrillation including ablations and medical therapy.  At this      point, I have suggested to him that we take 4-6 weeks and have him      try instituting a regular exercise program.  Will also liberalize      his salt intake to help with his orthostasis and see if he feels      better.  If  he does not, we will then consider antiarrhythmic      therapy with possible electrical cardioversion for his atrial      fibrillation to see if this improves his functional capacity.  I      will probably start with either Flecainide or Tikosyn.  2. Orthostatic hypotension.  This is mild.  We are going to liberalize      his salt.   DISPOSITION:  Will see him back in 4-6  weeks.   TOTAL TIME:  Total time with visit 50 minutes.     Maurice Buckles. [name], MD  Electronically Signed    DRB/MedQ  DD: 12/18/2007  DT: 12/18/2007  Job #: [id number]   cc:   Maurice Mole. Swords, MD

## 2011-04-24 NOTE — Assessment & Plan Note (Signed)
[facility]                            CARDIOLOGY OFFICE NOTE   JERVIS, TRAPANI                         MRN:          [id number]  DATE:01/12/2008                            DOB:          January 13, 1933    PRIMARY CARE PHYSICIAN:  Dr. [name].   HISTORY:  Mr. Paule is a delightful 77 year old male with history of  chronic atrial fibrillation, obstructive sleep apnea (noncompliance with  CPAP), chest pain and normal coronary arteries by catheterization 1999,  hypertension, history of spontaneous dissection of right internal  carotid artery in 2002 who returns today for routine follow-up.   We last saw him about a month ago when he was complaining of fatigue and  also frequent spells of dizziness.  He was mildly orthostatic at the  time.  I have suggested that he liberalize his salt intake and also  engage in a routine exercise program.   Returns today with his wife routine follow-up.  He is initially did very  well with his walking program.  They were very active and he felt much  better.  However, the past week or two has been cold outside and he has  not been exercising as much.  He says his dizziness is almost completely  resolved with the salt liberalization.  Overall he is feeling fairly  well.  Denies any chest pain, orthopnea or PND.   MEDICATIONS:  Prilosec 20 mg a day, Flomax 0.4 a day, Vesicare,  warfarin, aspirin 81.  Diltiazem 120 mg a day.   PHYSICAL EXAM:  He is well-appearing in no acute distress.  Ambulates in  the clinic without respiratory difficulty.  Blood pressure is hard to hear his left arm.  His right arm it is 90/64,  heart rate of 92, weight 189.  HEENT is normal.  NECK:  Supple.  There is no JVD.  Carotid 2+ bilateral bruits.  There is  no lymphadenopathy or thyromegaly.  CARDIAC:  PMI is nondisplaced.  He is irregular with no murmurs, rubs or  gallops.  LUNGS:  Clear.  ABDOMEN:  Soft, nontender, nondistended.   There is no  hepatosplenomegaly, no bruits, no masses.  Good bowel sounds.  EXTREMITIES:  Warm.  No sinus clubbing or edema.  No rash.  NEURO:  Alert and x3.  Cranial nerves II-XII intact.  Moves all four  extremities without difficulty.  Affect is pleasant.   EKG shows atrial fibrillation with ventricular response of 92 beats per  minute no significant ST-T wave abnormalities.   ASSESSMENT/PLAN:  1. Atrial fibrillation.  This is chronic.  His ventricular response is      slightly higher than I would like it.  Ideally I would like seems      or between the 60-80 range.  He was previously on digoxin but this      was stopped due to bradycardia, heart rates in the 50s.  Given his      relative hypotension think it is reasonable to stop his Diltiazem      and put him back on digoxin 0.25 a  day.  We will bring him back in      1 week for a nurse to check his blood pressure, EKG, and digoxin      level.  We will continue Coumadin.  2. Fatigue.  I suspect this is multifactorial.  As before we did      discuss the possibly that this could be related to atrial      fibrillation but he did seem to get some improvement with an      exercise.  We will reinstitute his exercise program and hold off on      consideration of antiarrhythmic therapies for now.  He is very much      reluctant for considering cardioversion.  3. Hypertension.  Continuous salt liberalization and we are stopping      his Diltiazem.   DISPOSITION:  I will see him back in 1 month for routine follow-up.     [name]. [name], MD  Electronically Signed    DRB/MedQ  DD: 01/12/2008  DT: 01/12/2008  Job #: [id number]   cc:   Valetta Mole. Swords, MD

## 2011-04-24 NOTE — Assessment & Plan Note (Signed)
[facility]                            CARDIOLOGY OFFICE NOTE   AJANI, RINEER                         MRN:          [id number]  DATE:05/31/2008                            DOB:          [birth year]    PRIMARY CARE PHYSICIAN:  Valetta Mole. 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   At the last time we saw him, he was having some dizziness.  We have  stopped his diltiazem and digoxin.  We put a LifeWatch monitor on him  this showed chronic atrial fibrillation, mean heart rate was 86.  There  was no significant bradycardia or pauses.   He comes back today for routine followup.  He is doing great.  He does  have just minimal dizziness.  He has started an exercise class.  He has  not had any chest pain or shortness of breath.  He is doing well.   CURRENT MEDICATIONS:  1. Coumadin.  2. Flomax 0.4 a day.  3. Vesicare 5 a day.  4. Aspirin 81 a day.  5. Calcium 600 a day.  6. Multivitamin.  7. Prilosec 20 every other day.   PHYSICAL EXAMINATION:  GENERAL:  He is well appearing, in no acute  distress, ambulates around the [place] without any respiratory  difficulty.  VITAL SIGNS:  Blood pressure is 96/68, heart rate is 68, and weight is  190.  HEENT:  Normal.  NECK:  Supple.  No JVD, carotids are 2+ bilaterally without bruits.  There is no lymphadenopathy or thyromegaly.  CARDIAC:  PMI is nondisplaced.  He is regularly irregular.  No obvious  murmurs, rubs, or gallops.  LUNGS:  Clear.  ABDOMEN:  Soft, nontender, and  nondistended.  No hepatosplenomegaly.  No bruits.  No masses.  Good bowel sounds.  EXTREMITIES:  Warm.  No  cyanosis, clubbing, or edema.  No rash.  NEURO:  Alert and oriented x3.  Cranial nerves II through XII are  intact.  Moves all 4 extremities without difficulty.  Affect is  pleasant.   ASSESSMENT AND PLAN:  1. Chronic atrial fibrillation.  His monitor is reassuring.  There is      normal ventricular response.  There is no significant bradycardia.      We will continue to follow.  2. Cholesterol.  This is followed by Dr. Cato Mulligan.  Would be as      aggressively as possible of this, given his previous carotid artery      dissection.  We would also check a CRP to make sure that is low as      well.  Would have a low threshold to start statins to stabilize his      endothelium.   DISPOSITION:  I will see him back for a routine followup  in 9 months.     [name]. [name], MD  Electronically Signed    DRB/MedQ  DD: 05/31/2008  DT: 06/01/2008  Job #: [id number]

## 2011-04-24 NOTE — Assessment & Plan Note (Signed)
[facility]                         GASTROENTEROLOGY OFFICE NOTE   [facility], [facility]                         MRN:          [id number]  DATE:03/23/2008                            DOB:          1933-10-08    Mr. [facility] is a 77 year old white male with new-onset atrial fibrillation  on Coumadin therapy.  I have followed him for many years because of  irritable bowel syndrome with alternating diarrhea and constipation and  chronic acid reflux symptoms.  He presents to the office today because  of some asymptomatic rectal bleeding off and on for the last two months  with  continued mild constipation.   [facility] has had multiple surgical procedures, including bilateral inguinal  hernia repairs, umbilical hernia repair, and laparoscopic  cholecystectomy.  He has had irritable bowel syndrome and takes daily  fiber supplements.  His last colonoscopy was in December, 2004.  At that  time, it was unremarkable, but he does have a past history of  adenomatous colon polyps.  He was due for a follow-up exam.  His current  rectal bleeding is asymptomatic, and it is mostly with wiping in the  toilet bowl.  He denies a passage of clots for melena.   It is of note that the patient developed left lower quadrant pain and  was seen in [facility] emergency room and had a CT scan of the abdomen  in June, 2008.  From what I can ascertain on speaking with the patient,  it was felt that constipation was his main problem at that time, and he  really was not treated for any specific condition when his CT scan was  normal.  He currently denies abdominal pain, upper GI or hepatobiliary  complaints, although he is on daily Prilosec for acid reflux.   PAST MEDICAL HISTORY:  New-onset atrial fibrillation requiring  anticoagulation.  He has obstructive sleep apnea, chest pain with normal  coronary arteries, essentially hypertension, previous history of  spontaneous dissection of his  right internal carotid artery in 2002, and  also has a history of BPH.  He recently saw Dr. Alwyn Ren, who manages his  type 2 diabetes, his stress incontinence, and hyperlipidemia.   From what I can ascertain from reviewing his records, the patient has  never had a CVA, MI, or problems with embolization.  Recent lab data on  December 19, 2007 showed a normal CBC and metabolic profile with a  hemoglobin of 15.5.  His prothrombin time and INR was approximately 2.6.   MEDICATIONS:  1. Daily warfarin.  2. Digoxin 0.25 mg a day.  3. Flomax 0.4 mg a day.  4. Vesicare 5 mg a day (this is an antispasmodic anticholinergic).  5. Aspirin 81 mg a day.  6. Calcium 600 mg a day.  7. Prilosec 20 mg a day.   In the past, he has had reactions to [facility] and NIMLODIPINE.   FAMILY HISTORY:  Noncontributory.   SOCIAL HISTORY:  He is married and lives with his wife and his father-in-  Social worker.  He has a Naval architect.  He is retired  from an accounting  position.  He does not smoke or abuse ethanol.   REVIEW OF SYSTEMS:  Noncontributory except for occasional nosebleeds.  He denies any current cardiovascular or pulmonary complaints.  He denies  any specific food intolerances, anorexia, or weight loss.   PHYSICAL EXAMINATION:  He is a healthy-appearing white male, appearing  younger than his stated age.  He is in no acute distress.  He is 5 feet 10-1/2 inches and weighs 191 pounds.  Blood pressure is  100/60.  Pulse is 68 and slightly irregular.  I could not appreciate stigmata of chronic liver disease.  CHEST:  Clear.  He had a slightly irregular rate but no murmurs, rubs or gallops.  There was no hepatosplenomegaly, abdominal masses, or tenderness.  The  patient did not have an anatomical umbilicus.  Bowel sounds were normal.  Peripheral extremities showed no edema, phlebitis, or swollen joints.  Inspection of the rectum showed two lateral external hemorrhoids with  some stigmata of recent bleeding.   Rectal exam otherwise was  unremarkable without prostatic enlargement.  The stool was guaiac  negative.   ASSESSMENT:  1. Probable hemorrhoidal bleeding in a patient who has had a history      of previous colon polyps.  2. Resolved episode of rather marked left lower quadrant pain last      summer, suggestive of subacute diverticulitis.  3. Chronic gastroesophageal reflux disease on Prilosec therapy.  4. New-onset atrial fibrillation on chronic Coumadin over the last two      years.  5. History of benign prostatic hypertrophy.  6. Multiple abdominal surgical procedures.  7. History of SULFA allergy.  8. History of colon polyps.  9. History of irritable bowel syndrome.   RECOMMENDATIONS:  1. Continue high-fiber diet.  I have asked him to take daily Benefiber      1 tablespoon in cereal.  2. Sitz baths b.i.d. and local AnaMantle cream.  3. Follow-up colonoscopy at his convenience.  We will hold Coumadin      five days before this      procedure, unless otherwise advised by Dr. Cato Mulligan or by Dr.      Gala Romney.  4. Continue all other multiple medications as listed above.     Vania Rea. Jarold Motto, MD, Caleen Essex, FAGA  Electronically Signed    DRP/MedQ  DD: 03/23/2008  DT: 03/23/2008  Job #: [id number]   cc:   [name]. [name], MD  Valetta Mole. Swords, MD

## 2011-04-24 NOTE — Assessment & Plan Note (Signed)
[facility]                            CARDIOLOGY OFFICE NOTE   [facility], [facility]                         MRN:          [id number]  DATE:08/13/2007                            DOB:          Jul 03, 1933    PRIMARY CARE PHYSICIAN:  Dr. Birdie [facility].   INTERVAL HISTORY:  Mr. [facility] is a very pleasant 77 year old male with a  history of chronic atrial fibrillation, obstructive sleep apnea, chest  pain with normal coronaries by catheterization in 1999, hypertension,  and a history of a spontaneous dissection of the right internal carotid  artery in 2002. He returns today for his annual follow up.   He is doing very well. He is active without any chest pain or shortness  of breath. He has been watching his diet quite closely and has lost over  20 pounds in the past year and a half. He denies any palpitations. There  is no shortness of breath or chest pain. His family does note that he  has had three episodes when he is walking that he starts towards the  side. He saw Dr.  Bing in vascular surgery who apparently did a carotid  ultrasound and he is not aware of the results of this. He has not had  any other focal neurologic deficits. He denies any syncope or pre-  syncope.   CURRENT MEDICATIONS:  1. Prilosec 20 mg daily.  2. Diltiazem 120 mg daily.  3. Digoxin 0.25 mg daily.  4. Multivitamin.  5. Calcium.  6. Flomax 0.4 mg daily.  7. Vesicare.  8. Coumadin.  9. Aspirin 81 mg daily.   PHYSICAL EXAMINATION:  GENERAL:  He is well appearing in no acute  distress. He ambulates around the clinic without any respiratory  difficulty.  VITAL SIGNS:  Blood pressure is initially 112/62, on manual recheck it  is 92/60. Heart rate 50, weight 188.  HEENT:  Normal.  NECK:  Supple. There is no JVD. Carotids are 2+ bilaterally without any  bruits. There is no lymphadenopathy or thyromegaly.  CARDIAC:  PMI is non-displaced. He is bradycardic and regular. No  obvious  murmurs.  LUNGS:  Clear.  ABDOMEN:  Obese with a ventral hernia. Soft, nontender, nondistended, no  hepatosplenomegaly, no bruits, no masses, good bowel sounds.  EXTREMITIES:  Warm with no cyanosis, clubbing, or edema.  NEUROLOGIC:  Awake, alert, and oriented x3. Cranial nerves II-XII are  intact. He can use all four extremities without difficulty. Affect is  pleasant.   EKG shows atrial fibrillation with a slow ventricular response at a rate  of 50, non-specific T-wave flattening.   ASSESSMENT AND PLAN:  1. Chronic atrial fibrillation. His ventricular response is quite low.      I wonder whether or not his leaning episodes may have been due to      bradycardia or decreased cerebral perfusion. We will stop his      Digoxin. We may also have to taper down his Diltiazem. I did      briefly mention to him the possibility of sick sinus syndrome and  the need for a pacer in the future, though certainly not indicated      currently.  2. Hypertension. Blood pressure looks great. It is a bit on the low      side.   DISPOSITION:  We will see him back in the clinic in a few months to  follow up on his heart rate and symptoms.     [facility]. [name], MD  Electronically Signed    DRB/MedQ  DD: 08/13/2007  DT: 08/14/2007  Job #: [id number]

## 2011-04-24 NOTE — Assessment & Plan Note (Signed)
[facility]                            CARDIOLOGY OFFICE NOTE   [facility], [facility]                         MRN:          [id number]  DATE:04/13/2008                            DOB:          Apr 27, 1933    PRIMARY CARE PHYSICIAN:  [facility], M.D.   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   Overall, he is doing fairly well.  At his last visit, he was having some  dizziness, so we stopped his diltiazem.  He continues to complain of  intermittent dizziness about 2-3 times a week, and this is associated  with presyncope.  He has not had frank syncope.  He denies any chest  pain, shortness of breath.  No seizurelike activity.   CURRENT MEDICATIONS:  1. Coumadin.  2. Digoxin 0.25 a day.  3. Flomax 0.4 a day.  4. Vesicare 5 a day.  5. Aspirin 81 a day.  6. Calcium.  7. Multivitamin.  8. Prilosec.   PHYSICAL EXAMINATION:  GENERAL:  He is well appearing, in no acute  distress.  Ambulates around the clinic without any respiratory  difficulty.  VITAL SIGNS:  Blood pressure is 102/70, heart rate is 61.  HEENT:  Normal.  NECK:  Supple.  There is no JVD.  Carotids are 2+ bilaterally, without  bruits.  There is no lymphadenopathy or thyromegaly.  CARDIAC:  PMI is nondisplaced.  He is bradycardic and irregular.  No  obvious murmurs.  LUNGS:  Clear.  ABDOMEN:  Soft, nontender, nondistended.  No hepatosplenomegaly, no  bruits, no masses.  Good bowel sounds.  EXTREMITIES:  Warm, with no cyanosis, clubbing, or edema.  No rash.  NEUROLOGIC:  Alert and oriented x3.  Cranial nerves II-XII are grossly  intact.  Moves all four  extremities without difficulty.  Affect is  pleasant.   EKG:  Shows atrial fibrillation at a rate of 61, with nonspecific ST-T  wave changes.   ASSESSMENT AND PLAN:  1. Dizziness/presyncope.  I suspect this is related to bradycardia.      We will go ahead and stop his digoxin and put a 2-week event      monitor on him.  I strongly suspect he may need a pacemaker in the      near future.  2. Chronic atrial fibrillation.  We have discussed previously the      possibility of cardioversion.  He does not want this.  He will      continue on Coumadin.  3. Hypertension.  Blood pressure is actually on the low side.      Continue current therapy.  We have had him liberalize his salt.   DISPOSITION:  Will see him back in clinic in 6 weeks to discuss the  results of his monitor.  [facility]ice Buckles. [name], MD  Electronically Signed    DRB/MedQ  DD: 04/13/2008  DT: 04/13/2008  Job #: [id number]   cc:   [facility]. Swords, MD

## 2011-04-24 NOTE — Patient Instructions (Signed)
Same dose 

## 2011-04-27 NOTE — Assessment & Plan Note (Signed)
[facility]                            CARDIOLOGY OFFICE NOTE   LINDBERGH, WINKLES                         MRN:          [id number]  DATE:11/18/2006                            DOB:          Dec 04, 1933    PRIMARY CARE PHYSICIAN:  Valetta Mole. Swords, MD.   PATIENT IDENTIFICATION:  1. [facility] is a very pleasant 77 year old male who returns for      routine followup.      a.     Atrial fibrillation diagnosed in January 2007. Echo January       2007, normal ejection fraction, no significant valvular disease.      b.     Adenosine Cardiolite January 2007 no evidence of ischemia.      c.     A 48-hour Holter monitor in July 2007 showed good rate       control with chronic atrial fibrillation.  2. 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  3. Obstructive sleep apnea noncompliant with CPAP.  4. Cerebral aneurysm followed by Dr. Corliss Skains.  5. History of chest pain.      a.     Cardiac catheterization 1999 which was normal.  6. Hypertension.  7. Obesity.  8. Fatigue.   CURRENT MEDICATIONS:  1. Prilosec 20 a day.  2. Diltiazem 240 a day.  3. Digoxin 0.25 mg a day.  4. Warfarin.  5. Calcium.  6. Multivitamin.  7. Flomax.   INTERVAL HISTORY:  [facility] returns today for routine followup. Overall,  he is doing quite well. He does note some fatigue but he is able to walk  without any chest pain or dyspnea. His wife says that he does ambulate  quite slowly. This is really unchanged. His lower extremity is much  improved. He has not been compliant with his CPAP which he says he is no  longer snoring. Otherwise doing well.   PHYSICAL EXAMINATION:  GENERAL:  He is an elderly male in no acute  distress. He ambulates around the clinic slowly without any respiratory  difficulty.  VITAL SIGNS:  Blood pressure is 120/77, heart rate is 75.  HEENT:  Sclera anicteric. EOMI. There is no xanthelasma. His mucous  membranes are  moist.  NECK:  Supple. No JVD. Oropharynx is clear. Carotids are 2+ bilaterally  without any bruits. There is no lymphadenopathy or thyromegaly.  CARDIAC:  Irregularly irregular. No obvious murmurs, rubs or gallops.  LUNGS:  Clear.  ABDOMEN:  Mildly obese with a ventral hernia. Soft, nontender,  nondistended, no hepatosplenomegaly, no bruits, no masses.  EXTREMITIES:  Warm with no cyanosis, clubbing or edema.  NEUROLOGIC:  She is alert and oriented x3. Cranial nerves II-XII are  intact. Moves all four extremities without difficulty.   EKG shows atrial fibrillation with ventricular response at 75 with  minimal nonspecific ST-T wave abnormalities.   ASSESSMENT:  1. Chronic atrial fibrillation. We once again revisited rate control      versus rhythm control strategies. Given that he is relatively  asymptomatic, we will continue with rate control. I suspect his      fatigue is unlikely to be due to his atrial fibrillation.  2. Fatigue. I think this is likely multifactorial. I encourage him to      get started with a routine exercise program. I also encouraged him      to be more compliant with his CPAP.  3. Hypertension followed by Dr. Cato Mulligan. This is well-controlled.   DISPOSITION:  We will see him back in clinic in one year for routine  followup. I did ask him to start aspirin 81 mg a day for further cardio  protection. I also encouraged him to be more compliant with his CPAP.     [name]. [name], MD  Electronically Signed    DRB/MedQ  DD: 11/18/2006  DT: 11/18/2006  Job #: [id number]

## 2011-05-08 ENCOUNTER — Encounter: Payer: Self-pay | Admitting: Internal Medicine

## 2011-05-29 ENCOUNTER — Other Ambulatory Visit (INDEPENDENT_AMBULATORY_CARE_PROVIDER_SITE_OTHER): Payer: Medicare Other

## 2011-05-29 DIAGNOSIS — Z7901 Long term (current) use of anticoagulants: Secondary | ICD-10-CM

## 2011-05-29 DIAGNOSIS — E119 Type 2 diabetes mellitus without complications: Secondary | ICD-10-CM

## 2011-05-29 DIAGNOSIS — I10 Essential (primary) hypertension: Secondary | ICD-10-CM

## 2011-05-29 DIAGNOSIS — E785 Hyperlipidemia, unspecified: Secondary | ICD-10-CM

## 2011-05-29 LAB — BASIC METABOLIC PANEL
CO2: 28 mEq/L (ref 19–32)
GFR: 81 mL/min (ref >60.00)
Glucose, Bld: 102 mg/dL — ABNORMAL HIGH (ref 70–99)
Potassium: 4.9 mEq/L (ref 3.5–5.1)
Sodium: 143 mEq/L (ref 135–145)

## 2011-05-29 LAB — LIPID PANEL
HDL: 55.3 mg/dL (ref >39.00)
Total CHOL/HDL Ratio: 4
Triglycerides: 259 mg/dL — ABNORMAL HIGH (ref 0.0–149.0)

## 2011-05-29 LAB — HEPATIC FUNCTION PANEL
ALT: 18 U/L (ref 0–53)
Albumin: 3.7 g/dL (ref 3.5–5.2)
Total Bilirubin: 0.8 mg/dL (ref 0.3–1.2)

## 2011-05-29 LAB — LDL CHOLESTEROL, DIRECT: Direct LDL: 100.1 mg/dL

## 2011-05-29 NOTE — Patient Instructions (Signed)
Same dose .2.5 mg only on tuesdays 5 mg on other days,check in 4 weeks

## 2011-06-04 ENCOUNTER — Ambulatory Visit (INDEPENDENT_AMBULATORY_CARE_PROVIDER_SITE_OTHER): Payer: Medicare Other | Admitting: Internal Medicine

## 2011-06-04 ENCOUNTER — Encounter: Payer: Self-pay | Admitting: Internal Medicine

## 2011-06-04 DIAGNOSIS — R32 Unspecified urinary incontinence: Secondary | ICD-10-CM | POA: Insufficient documentation

## 2011-06-04 DIAGNOSIS — E119 Type 2 diabetes mellitus without complications: Secondary | ICD-10-CM

## 2011-06-04 DIAGNOSIS — I10 Essential (primary) hypertension: Secondary | ICD-10-CM

## 2011-06-04 DIAGNOSIS — I4891 Unspecified atrial fibrillation: Secondary | ICD-10-CM

## 2011-06-04 NOTE — Assessment & Plan Note (Addendum)
Chronic problem---needs further eval Refer to babtist May need to consider NPH---

## 2011-06-04 NOTE — Assessment & Plan Note (Signed)
Reviewed labs No meds Note cholesterol---discussed---note LDL of 100

## 2011-06-04 NOTE — Progress Notes (Signed)
  Subjective:    Patient ID: [facility], male    DOB: [birth year], 77 y.o.   MRN: [id number]  HPI Urinary incontinence---sees dr Luna Kitchens success with meds  Fecal incontinence---dr. Outlaw---sxs are much improved  afib---no recurrence---on chronic warfarin.   Past Medical History  Diagnosis Date  . Atrial fibrillation Jan 2007    echo 1-07 normal ejection fraction, no significant valvular disease. adenosine cardiolite 1-07  no evidence of ischemia. A 48 hour Holter monitor in 7-07 showed good rate controlw/ chronic atrial fibrillation. Cath 1/11 normal cors. EF 45%. Echo 2-11 60-65%  . Obstructive sleep apnea     noncompliant with CPAP  . Cerebral aneurysm     followed by Dr Corliss Skains  . History of chest pain     Cardia catheterization 1999 was normal. myoview 2007 normal. cardiac cath jan 2011 normal cors EF 45%  . Obesity   . Hypertension   . Fatigue   . Allergy     rhinitis  . Hx of colonic polyps     diverticulosis  . Hyperlipidemia   . Diabetes mellitus     Elevated glucose  . Retinal vein occlusion   . Benign prostatic hypertrophy   . IBS (irritable bowel syndrome)    Past Surgical History  Procedure Date  . Cholecystectomy   . Inguinal and umbilical herniorrhaphy   . Tonsillectomy and adenoidectomy as a child  . Cardiac catheterization 1999    reports that he has quit smoking. He does not have any smokeless tobacco history on file. He reports that he drinks alcohol. His drug history not on file. family history includes Cancer in his father and maternal grandmother and Diabetes in his son. Allergies  Allergen Reactions  . Clarithromycin     REACTION: Headache  . Epinephrine     REACTION: Increased Heart Rate  . Nimodipine     REACTION: Flushing      Review of Systems  patient denies chest pain, shortness of breath, orthopnea. Denies lower extremity edema, abdominal pain, change in appetite, change in bowel movements. Patient denies rashes,  musculoskeletal complaints. No other specific complaints in a complete review of systems.      Objective:   Physical Exam  well-developed well-nourished male in no acute distress. HEENT exam atraumatic, normocephalic, neck supple without jugular venous distention. Chest clear to auscultation cardiac exam S1-S2 are irregular. Abdominal exam overweight with bowel sounds, soft and nontender. Extremities no edema. Neurologic exam is alert with a normal gait.     Assessment & Plan:

## 2011-06-04 NOTE — Assessment & Plan Note (Signed)
Currently well controlled and not on any medications

## 2011-06-04 NOTE — Assessment & Plan Note (Signed)
Chronic warfarin use   Latest dosing instructions   Total Sun Mon Tue Wed Thu Fri Sat   32.5 5 mg 5 mg 2.5 mg 5 mg 5 mg 5 mg 5 mg    (5 mg1) (5 mg1) (5 mg0.5) (5 mg1) (5 mg1) (5 mg1) (5 mg1)

## 2011-06-26 ENCOUNTER — Ambulatory Visit (INDEPENDENT_AMBULATORY_CARE_PROVIDER_SITE_OTHER): Payer: Medicare Other

## 2011-06-26 DIAGNOSIS — Z7901 Long term (current) use of anticoagulants: Secondary | ICD-10-CM

## 2011-06-26 LAB — POCT INR: INR: 2.4

## 2011-06-26 NOTE — Patient Instructions (Signed)
Same dose .2.5 mg only on tuesdays 5 mg on other days,check in 4 weeks

## 2011-07-06 ENCOUNTER — Encounter: Payer: Self-pay | Admitting: Internal Medicine

## 2011-07-26 ENCOUNTER — Other Ambulatory Visit (INDEPENDENT_AMBULATORY_CARE_PROVIDER_SITE_OTHER): Payer: Medicare Other | Admitting: Internal Medicine

## 2011-07-26 DIAGNOSIS — Z7901 Long term (current) use of anticoagulants: Secondary | ICD-10-CM

## 2011-07-26 NOTE — Patient Instructions (Signed)
Same dose 

## 2011-08-03 ENCOUNTER — Telehealth: Payer: Self-pay | Admitting: Internal Medicine

## 2011-08-03 NOTE — Telephone Encounter (Signed)
Pt called and said that Dr Sandria Manly has done testing and has confirmed that pt is diabetic. Dr Sandria Manly suggested that the pt get an ov with Dr Cato Mulligan asap re: this matter.

## 2011-08-06 NOTE — Telephone Encounter (Signed)
Have him see Dr. Artist Pais for an early visit

## 2011-08-06 NOTE — Telephone Encounter (Signed)
Left message for pt to call and schedule appt with Dr.  Artist Pais

## 2011-08-16 ENCOUNTER — Encounter: Payer: Self-pay | Admitting: Internal Medicine

## 2011-08-16 ENCOUNTER — Ambulatory Visit (INDEPENDENT_AMBULATORY_CARE_PROVIDER_SITE_OTHER): Payer: Medicare Other | Admitting: Internal Medicine

## 2011-08-16 DIAGNOSIS — R7303 Prediabetes: Secondary | ICD-10-CM

## 2011-08-16 DIAGNOSIS — R7309 Other abnormal glucose: Secondary | ICD-10-CM

## 2011-08-16 NOTE — Assessment & Plan Note (Addendum)
77 year old with prediabetes. His last A1c was slightly worse at 6.3. I doubt that this is the basis of his peripheral neuropathy. I advised patient to treat with diet and exercise. We reviewed following a low carbohydrate diet and a handout was provided. Monitor A1c before next visit with his primary care physician.  We reviewed fasting lipid panel. His last LDL was less than 100. His triglycerides were elevated which would be consistent with prediabetes.  This should respond to diet and exercise.  Defer to PCP whether to initiate statin therapy.  He is not a candidate for aspirin therapy because he is already on Coumadin

## 2011-08-16 NOTE — Patient Instructions (Signed)
Please limit your carbohydrate intake to 30 grams per meal Try to exercise daily for 30 minutes. Please complete the following lab tests before your next follow up appointment: BMET, A1c - 790.29

## 2011-08-16 NOTE — Progress Notes (Signed)
Subjective:    Patient ID: Maurice Wood, male    DOB: 09-11-1933, 77 y.o.   MRN: [id number]  HPI  77 year old white male for followup from neurology visit. He was seen by Dr. love and underwent extensive testing which revealed peripheral neuropathy.  Laboratory workup included glucose challenge test which was abnormal.  Review of his previous medical records shows slightly elevated A1c which his primary care physician has been monitoring.  Patient was unaware of this prediabetic status. He has been monitoring his fasting blood sugar at home. AM readings have been less than 120.  Once the right reading above 150 after eating breakfast.   His son was diagnosed with DM II.    Review of Systems  He denies polyuria or polydipsia Past Medical History  Diagnosis Date  . Atrial fibrillation Jan 2007    echo 1-07 normal ejection fraction, no significant valvular disease. adenosine cardiolite 1-07  no evidence of ischemia. A 48 hour Holter monitor in 7-07 showed good rate controlw/ chronic atrial fibrillation. Cath 1/11 normal cors. EF 45%. Echo 2-11 60-65%  . Obstructive sleep apnea     noncompliant with CPAP  . Cerebral aneurysm     followed by Dr Corliss Skains  . History of chest pain     Cardia catheterization 1999 was normal. myoview 2007 normal. cardiac cath jan 2011 normal cors EF 45%  . Obesity   . Hypertension   . Fatigue   . Allergy     rhinitis  . Hx of colonic polyps     diverticulosis  . Hyperlipidemia   . Diabetes mellitus     Elevated glucose  . Retinal vein occlusion   . Benign prostatic hypertrophy   . IBS (irritable bowel syndrome)     History   Social History  . Marital Status: Married    Spouse Name: N/A    Number of Children: N/A  . Years of Education: N/A   Occupational History  . Not on file.   Social History Main Topics  . Smoking status: Former Games developer  . Smokeless tobacco: Not on file  . Alcohol Use: Yes  . Drug Use:   . Sexually Active:    Other  Topics Concern  . Not on file   Social History Narrative  . No narrative on file    Past Surgical History  Procedure Date  . Cholecystectomy   . Inguinal and umbilical herniorrhaphy   . Tonsillectomy and adenoidectomy as a child  . Cardiac catheterization 1999    Family History  Problem Relation Age of Onset  . Cancer Father     lung  . Diabetes Son   . Cancer Maternal Grandmother     colon    Allergies  Allergen Reactions  . Clarithromycin     REACTION: Headache  . Epinephrine     REACTION: Increased Heart Rate  . Nimodipine     REACTION: Flushing    Current Outpatient Prescriptions on File Prior to Visit  Medication Sig Dispense Refill  . aspirin 81 MG tablet Take 81 mg by mouth daily.        . Calcium Carbonate-Vitamin D (CALCIUM + D) 600-200 MG-UNIT TABS Take by mouth.        . cholestyramine (QUESTRAN) 4 GM/DOSE powder Take 4 g by mouth 2 (two) times daily with a meal.        . fluticasone (FLONASE) 50 MCG/ACT nasal spray 2 sprays by Nasal route daily.  16 g  5  .  Multiple Vitamin (MULTIVITAMIN) tablet Take 1 tablet by mouth daily.        Marland Kitchen warfarin (COUMADIN) 5 MG tablet Take 5 mg by mouth daily. Qd as directed except 2.5mg  T-Th         BP 112/74  Pulse 76  Temp(Src) 98.3 F (36.8 C) (Oral)  Wt 198 lb (89.812 kg)       Objective:   Physical Exam   Constitutional: Appears well-developed and well-nourished. No distress.  Neck: Normal range of motion. Neck supple. No thyromegaly present. No carotid bruit Cardiovascular: Normal rate, regular rhythm and normal heart sounds.  Exam reveals no gallop and no friction rub.   No murmur heard. Pulmonary/Chest: Effort normal and breath sounds normal.  No wheezes. No rales.  Neurological: Alert. No cranial nerve deficit.  Skin: Skin is warm and dry.  Psychiatric: Normal mood and affect. Behavior is normal.        Assessment & Plan:

## 2011-08-31 ENCOUNTER — Ambulatory Visit (INDEPENDENT_AMBULATORY_CARE_PROVIDER_SITE_OTHER): Payer: Medicare Other | Admitting: Internal Medicine

## 2011-08-31 DIAGNOSIS — Z7901 Long term (current) use of anticoagulants: Secondary | ICD-10-CM

## 2011-08-31 NOTE — Patient Instructions (Signed)
Same dose .2.5 mg only on tuesdays 5 mg on other days,check in 4 weeks  

## 2011-09-04 ENCOUNTER — Encounter: Payer: Self-pay | Admitting: Internal Medicine

## 2011-09-26 LAB — BASIC METABOLIC PANEL
BUN: 9
CO2: 26
Calcium: 8.6
Creatinine, Ser: 1.05
GFR calc Af Amer: >60

## 2011-09-26 LAB — DIFFERENTIAL
Basophils Absolute: 0
Basophils Relative: 0
Eosinophils Absolute: 0.1
Eosinophils Relative: 2
Lymphocytes Relative: 22

## 2011-09-26 LAB — CBC
MCHC: 33.1
Platelets: 188
RBC: 4.74
RDW: 15.4 — ABNORMAL HIGH

## 2011-10-08 DIAGNOSIS — R350 Frequency of micturition: Secondary | ICD-10-CM | POA: Insufficient documentation

## 2011-10-08 DIAGNOSIS — N3941 Urge incontinence: Secondary | ICD-10-CM | POA: Insufficient documentation

## 2011-10-09 ENCOUNTER — Ambulatory Visit (INDEPENDENT_AMBULATORY_CARE_PROVIDER_SITE_OTHER): Payer: Medicare Other | Admitting: Internal Medicine

## 2011-10-09 DIAGNOSIS — R7309 Other abnormal glucose: Secondary | ICD-10-CM

## 2011-10-09 DIAGNOSIS — G4733 Obstructive sleep apnea (adult) (pediatric): Secondary | ICD-10-CM

## 2011-10-09 DIAGNOSIS — Z7901 Long term (current) use of anticoagulants: Secondary | ICD-10-CM

## 2011-10-09 DIAGNOSIS — R7303 Prediabetes: Secondary | ICD-10-CM

## 2011-10-09 LAB — POCT INR: INR: 2.5

## 2011-10-09 NOTE — Patient Instructions (Signed)
  Latest dosing instructions   Total Sun Mon Tue Wed Thu Fri Sat   32.5 5 mg 5 mg 5 mg 5 mg 2.5 mg 5 mg 5 mg    (5 mg1) (5 mg1) (5 mg1) (5 mg1) (5 mg0.5) (5 mg1) (5 mg1)        

## 2011-10-09 NOTE — Assessment & Plan Note (Signed)
GGT--glucose 238 at one hour Discussed at length with patient and wife Understand need for weight loss.

## 2011-10-09 NOTE — Progress Notes (Signed)
  Subjective:    Patient ID: Maurice Wood, male    DOB: 12/21/1932, 77 y.o.   MRN: [id number]  HPI  patient comes in for followup of multiple medical problems including type 2 diabetes, hyperlipidemia, hypertension. The patient does not check blood sugar or blood pressure at home. The patetient does not follow an exercise or diet program. The patient denies any polyuria, polydipsia.  In the past the patient has gone to diabetic treatment center. The patient is tolerating medications  Without difficulty. The patient does admit to medication compliance (not taking any DM meds)  Past Medical History  Diagnosis Date  . Atrial fibrillation Jan 2007    echo 1-07 normal ejection fraction, no significant valvular disease. adenosine cardiolite 1-07  no evidence of ischemia. A 48 hour Holter monitor in 7-07 showed good rate controlw/ chronic atrial fibrillation. Cath 1/11 normal cors. EF 45%. Echo 2-11 60-65%  . Obstructive sleep apnea     noncompliant with CPAP  . Cerebral aneurysm     followed by Dr Corliss Skains  . History of chest pain     Cardia catheterization 1999 was normal. myoview 2007 normal. cardiac cath jan 2011 normal cors EF 45%  . Obesity   . Hypertension   . Fatigue   . Allergy     rhinitis  . Hx of colonic polyps     diverticulosis  . Hyperlipidemia   . Diabetes mellitus     Elevated glucose  . Retinal vein occlusion   . Benign prostatic hypertrophy   . IBS (irritable bowel syndrome)    Past Surgical History  Procedure Date  . Cholecystectomy   . Inguinal and umbilical herniorrhaphy   . Tonsillectomy and adenoidectomy as a child  . Cardiac catheterization 1999    reports that he has quit smoking. He does not have any smokeless tobacco history on file. He reports that he drinks alcohol. His drug history not on file. family history includes Cancer in his father and maternal grandmother and Diabetes in his son. Allergies  Allergen Reactions  . Clarithromycin     REACTION:  Headache  . Epinephrine     REACTION: Increased Heart Rate  . Nimodipine     REACTION: Flushing   Review of Systems  patient denies chest pain, shortness of breath, orthopnea. Denies lower extremity edema, abdominal pain, change in appetite, change in bowel movements. Patient denies rashes, musculoskeletal complaints. No other specific complaints in a complete review of systems.      Objective:   Physical Exam   well-developed well-nourished male in no acute distress. HEENT exam atraumatic, normocephalic, neck supple without jugular venous distention. Chest clear to auscultation cardiac exam S1-S2 are regular. Abdominal exam overweight with bowel sounds, soft and nontender. Extremities no edema. Neurologic exam is alert with a normal gait.     Assessment & Plan:

## 2011-11-07 ENCOUNTER — Encounter: Payer: Self-pay | Admitting: Internal Medicine

## 2011-11-07 ENCOUNTER — Ambulatory Visit (INDEPENDENT_AMBULATORY_CARE_PROVIDER_SITE_OTHER): Payer: Medicare Other | Admitting: Internal Medicine

## 2011-11-07 VITALS — BP 112/64 | HR 84 | Ht 70.0 in | Wt 195.0 lb

## 2011-11-07 DIAGNOSIS — I4891 Unspecified atrial fibrillation: Secondary | ICD-10-CM

## 2011-11-07 NOTE — Patient Instructions (Addendum)
Your physician wants you to follow-up in: one  Year in the heart failure clinic at [facility].  You will receive a reminder letter in the mail two months in advance. If you don't receive a letter, please call our office to schedule the follow-up appointment.

## 2011-11-07 NOTE — Assessment & Plan Note (Signed)
Followed by Dr. Cato Mulligan. TGs quite high. Suggested pushing his efforts with diet and exercise.

## 2011-11-07 NOTE — Assessment & Plan Note (Signed)
Chronic. Asymptomatic. Continue coumadin.

## 2011-11-07 NOTE — Progress Notes (Signed)
HPI:  Maurice Wood is a delightful 77 year old male with a history of chronic atrial fibrillation, obstructive sleep apnea, with noncompliance with CPAP. 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 artificially low due to AF). Echo  2/11: 60-65%  Returns for f/u. Doing well.  No CP, SOB or palpitations. No edema, orthopnea or PND. Taking coumadin religiously. No bleeding. INR therapeutic. Exercising somewhat at [employer] but not vigorously. Having problems with early diabetes    ROS: All systems negative except as listed in HPI, PMH and Problem List.  Past Medical History  Diagnosis Date  . Atrial fibrillation Jan 2007    echo 1-07 normal ejection fraction, no significant valvular disease. adenosine cardiolite 1-07  no evidence of ischemia. A 48 hour Holter monitor in 7-07 showed good rate controlw/ chronic atrial fibrillation. Cath 1/11 normal cors. EF 45%. Echo 2-11 60-65%  . Obstructive sleep apnea     noncompliant with CPAP  . Cerebral aneurysm     followed by Dr Corliss Skains  . History of chest pain     Cardia catheterization 1999 was normal. myoview 2007 normal. cardiac cath jan 2011 normal cors EF 45%  . Obesity   . Hypertension   . Fatigue   . Allergy     rhinitis  . Hx of colonic polyps     diverticulosis  . Hyperlipidemia   . Diabetes mellitus     Elevated glucose  . Retinal vein occlusion   . Benign prostatic hypertrophy   . IBS (irritable bowel syndrome)     Current Outpatient Prescriptions  Medication Sig Dispense Refill  . aspirin 81 MG tablet Take 81 mg by mouth daily.        . Calcium Carbonate-Vitamin D (CALCIUM + D) 600-200 MG-UNIT TABS Take by mouth.        . cetirizine (ZYRTEC) 10 MG tablet Take 10 mg by mouth daily.        . cholestyramine (QUESTRAN) 4 GM/DOSE powder  Take 4 g by mouth 2 (two) times daily with a meal.        . fluticasone (FLONASE) 50 MCG/ACT nasal spray 2 sprays by Nasal route daily.  16 g  5  . Multiple Vitamin (MULTIVITAMIN) tablet Take 1 tablet by mouth daily.        . VOLTAREN 1 % GEL As needed      . warfarin (COUMADIN) 5 MG tablet Take 5 mg by mouth daily. Qd as directed except 2.5mg  Tuesday         PHYSICAL EXAM: Filed Vitals:   11/07/11 0817  BP: 112/64  Pulse: 84    General:  Well appearing. No resp difficulty HEENT: normal Neck: supple. JVP flat. Carotids 2+ bilaterally; no bruits. No lymphadenopathy or thryomegaly appreciated. Cor: PMI normal. Regular rate & rhythm. No rubs, gallops or murmurs. Lungs: clear Abdomen: soft, nontender, nondistended. No hepatosplenomegaly. No bruits or masses. Good bowel sounds. Extremities: no cyanosis, clubbing, rash, edema Neuro: alert & orientedx3, cranial nerves grossly intact. Moves all 4 extremities w/o difficulty. Affect pleasant.   Lab Results  Component Value Date   CHOL 208* 05/29/2011   HDL 55.30 05/29/2011   LDLCALC  Value: 87        Total Cholesterol/HDL:CHD Risk Coronary Heart Disease Risk Table  Men   Women  1/2 Average Risk   3.4   3.3  Average Risk       5.0   4.4  2 X Average Risk   9.6   7.1  3 X Average Risk  23.4   11.0        Use the calculated Patient Ratio above and the CHD Risk Table to determine the patient's CHD Risk.        ATP III CLASSIFICATION (LDL):  <100     mg/dL   Optimal  161-096  mg/dL   Near or Above                    Optimal  130-159  mg/dL   Borderline  045-409  mg/dL   High  >811     mg/dL   Very High 08/10/4781   LDLDIRECT 100.1 05/29/2011   TRIG 259.0* 05/29/2011   CHOLHDL 4 05/29/2011    ECG:  AF 84 LVH  t wave flattening   ASSESSMENT & PLAN:

## 2011-11-08 ENCOUNTER — Other Ambulatory Visit: Payer: Self-pay | Admitting: Internal Medicine

## 2011-11-09 ENCOUNTER — Other Ambulatory Visit (INDEPENDENT_AMBULATORY_CARE_PROVIDER_SITE_OTHER): Payer: Medicare Other

## 2011-11-09 DIAGNOSIS — Z7901 Long term (current) use of anticoagulants: Secondary | ICD-10-CM

## 2011-11-09 DIAGNOSIS — Z79899 Other long term (current) drug therapy: Secondary | ICD-10-CM

## 2011-11-09 DIAGNOSIS — R7303 Prediabetes: Secondary | ICD-10-CM

## 2011-11-09 DIAGNOSIS — R7309 Other abnormal glucose: Secondary | ICD-10-CM

## 2011-11-09 LAB — LIPID PANEL
Cholesterol: 195 mg/dL (ref 0–200)
HDL: 52.8 mg/dL (ref >39.00)
Total CHOL/HDL Ratio: 4
Triglycerides: 224 mg/dL — ABNORMAL HIGH (ref 0.0–149.0)
VLDL: 44.8 mg/dL — ABNORMAL HIGH (ref 0.0–40.0)

## 2011-11-09 LAB — BASIC METABOLIC PANEL
Calcium: 9.8 mg/dL (ref 8.4–10.5)
Creatinine, Ser: 1.1 mg/dL (ref 0.4–1.5)
GFR: 67.72 mL/min (ref >60.00)

## 2011-11-09 LAB — HEPATIC FUNCTION PANEL
ALT: 17 U/L (ref 0–53)
AST: 28 U/L (ref 0–37)
Albumin: 3.8 g/dL (ref 3.5–5.2)
Alkaline Phosphatase: 70 U/L (ref 39–117)
Bilirubin, Direct: 0.1 mg/dL (ref 0.0–0.3)
Total Bilirubin: 1 mg/dL (ref 0.3–1.2)
Total Protein: 7.1 g/dL (ref 6.0–8.3)

## 2011-11-09 LAB — HEMOGLOBIN A1C: Hgb A1c MFr Bld: 5.8 % (ref 4.6–6.5)

## 2011-11-09 NOTE — Patient Instructions (Signed)
  Latest dosing instructions   Total Sun Mon Tue Wed Thu Fri Sat   32.5 5 mg 5 mg 5 mg 5 mg 2.5 mg 5 mg 5 mg    (5 mg1) (5 mg1) (5 mg1) (5 mg1) (5 mg0.5) (5 mg1) (5 mg1)

## 2011-11-13 ENCOUNTER — Other Ambulatory Visit: Payer: Self-pay | Admitting: Neurology

## 2011-11-13 DIAGNOSIS — R269 Unspecified abnormalities of gait and mobility: Secondary | ICD-10-CM

## 2011-11-13 DIAGNOSIS — R32 Unspecified urinary incontinence: Secondary | ICD-10-CM

## 2011-11-15 ENCOUNTER — Ambulatory Visit: Payer: Medicare Other | Admitting: Internal Medicine

## 2011-11-15 ENCOUNTER — Ambulatory Visit: Payer: Medicare Other

## 2011-11-17 ENCOUNTER — Ambulatory Visit
Admission: RE | Admit: 2011-11-17 | Discharge: 2011-11-17 | Disposition: A | Discharge status: Home / Self Care | Payer: Medicare Other | Source: Ambulatory Visit | Attending: Neurology | Admitting: Neurology

## 2011-11-17 DIAGNOSIS — R269 Unspecified abnormalities of gait and mobility: Secondary | ICD-10-CM

## 2011-11-17 DIAGNOSIS — R32 Unspecified urinary incontinence: Secondary | ICD-10-CM

## 2011-11-20 ENCOUNTER — Other Ambulatory Visit: Payer: Medicare Other

## 2011-11-23 ENCOUNTER — Ambulatory Visit (INDEPENDENT_AMBULATORY_CARE_PROVIDER_SITE_OTHER): Payer: Medicare Other

## 2011-11-23 DIAGNOSIS — Z7901 Long term (current) use of anticoagulants: Secondary | ICD-10-CM

## 2011-11-23 DIAGNOSIS — I10 Essential (primary) hypertension: Secondary | ICD-10-CM

## 2011-11-23 LAB — BASIC METABOLIC PANEL
BUN: 14 mg/dL (ref 6–23)
CO2: 26 mEq/L (ref 19–32)
Calcium: 9.2 mg/dL (ref 8.4–10.5)
GFR: 69.13 mL/min (ref >60.00)
Glucose, Bld: 111 mg/dL — ABNORMAL HIGH (ref 70–99)
Potassium: 5.3 mEq/L — ABNORMAL HIGH (ref 3.5–5.1)

## 2011-11-23 NOTE — Patient Instructions (Signed)
  Latest dosing instructions   Total Sun Mon Tue Wed Thu Fri Sat   35 5 mg 5 mg 5 mg 5 mg 5 mg 5 mg 5 mg    (5 mg1) (5 mg1) (5 mg1) (5 mg1) (5 mg1) (5 mg1) (5 mg1)        

## 2011-12-14 ENCOUNTER — Encounter: Payer: Self-pay | Admitting: *Deleted

## 2011-12-14 ENCOUNTER — Encounter: Payer: Medicare Other | Attending: Internal Medicine | Admitting: *Deleted

## 2011-12-14 DIAGNOSIS — Z713 Dietary counseling and surveillance: Secondary | ICD-10-CM | POA: Insufficient documentation

## 2011-12-14 DIAGNOSIS — R7309 Other abnormal glucose: Secondary | ICD-10-CM | POA: Insufficient documentation

## 2011-12-14 NOTE — Progress Notes (Signed)
Medical Nutrition Therapy:  Appt start time: 0930 end time:  1030.  Assessment:  Other Abnormal Glucose (790.29).  Pt here with spouse for assessment of Pre-DM. A1c trend includes 6.3% (05/29/11) and 5.8% (11/09/11).  PMH includes chronic A-fib; on warfarin therapy. Non-compliant with C-PAP for OSA ("it's too big; I don't like it").  Eats dinner out often and averages 3 meals/day plus small dessert at night. Excessive intake of CHO, fat, and sodium noted. Sporadically checks FBG at home with meter given by son (has T2DM). Meter values: 108 (09/27/11), 122 (10/03/11), 117 (12/09/11), 108 (12/14/11). Pt and spouse report neuropathy; unsure if related to Pre-DM.  No structured physical activity noted.   MEDICATIONS: See medication list; reconciled with pt.   DIETARY INTAKE:  Usual eating pattern includes 3 meals and 0-1 snacks per day.  24-hr recall:  B ( AM): Special K cereal w/ 1% milk and splenda; coffee w/ splenda  Snk ( AM): none  L ( PM): [employer] - 1/2 foot long hotdog, churro; sprite (6-8 oz)  Snk ( PM): None D ( PM): Leftovers from restaurant; Steak & shrimp, 1/2 c mashed potatoes, mixed veggies; green tea w/ splenda Snk ( PM): 3-4 pcs sweets (usually SF dessert or 1 cookie)  Usual physical activity: None  Estimated energy needs: 1500 calories 170 g carbohydrates 110-115 g protein 40-42 g fat  Progress Towards Goal(s):  In progress.   Nutritional Diagnosis:  [place]-2.2 Altered nutrition-related laboratory related to glucose metabolism as evidenced by recent A1c values of 6.3% and 5.8%  (05/29/11,11/09/11 respectively) and MD referral for assessment and education.    Intervention/Goals:  Follow Pre-Diabetes Meal Plan as instructed (see yellow card).  Eat 3 meals and 1-2 snacks; AVOID meal skipping.  Limit carbohydrate intake to 45 grams carbohydrate/meal.  Limit carbohydrate intake to 15 grams carbohydrate/snack.  Add lean protein foods to all meals/snacks.  Limit meals away from  home to decrease fat and sodium.  Aim for 30-45 mins (or more) of physical activity daily.  Handouts given during visit include:  Living Well with DM - [employer]  45 g meal plan  Snack list  Maurice Wood and Easy Diabetic Cooking (cookbook)  Monitoring/Evaluation:  Dietary intake, exercise, A1c (as available), and body weight in 6 week(s).

## 2011-12-14 NOTE — Patient Instructions (Addendum)
Goals:  Follow Pre-Diabetes Meal Plan as instructed (see yellow card).  Eat 3 meals and 1-2 snacks; AVOID meal skipping.  Limit carbohydrate intake to 45 grams carbohydrate/meal.  Limit carbohydrate intake to 15 grams carbohydrate/snack.  Add lean protein foods to all meals/snacks.  Limit meals away from home to decrease fat and sodium.  Aim for 30-45 mins (or more) of physical activity daily.

## 2011-12-17 ENCOUNTER — Encounter: Payer: Self-pay | Admitting: *Deleted

## 2011-12-24 ENCOUNTER — Other Ambulatory Visit (INDEPENDENT_AMBULATORY_CARE_PROVIDER_SITE_OTHER): Payer: Medicare Other

## 2011-12-24 DIAGNOSIS — Z7901 Long term (current) use of anticoagulants: Secondary | ICD-10-CM

## 2011-12-24 NOTE — Patient Instructions (Signed)
  Latest dosing instructions   Total Sun Mon Tue Wed Thu Fri Sat   35 5 mg 5 mg 5 mg 5 mg 5 mg 5 mg 5 mg    (5 mg1) (5 mg1) (5 mg1) (5 mg1) (5 mg1) (5 mg1) (5 mg1)        

## 2012-01-24 ENCOUNTER — Ambulatory Visit: Payer: Medicare Other | Admitting: *Deleted

## 2012-01-24 DIAGNOSIS — Z7901 Long term (current) use of anticoagulants: Secondary | ICD-10-CM

## 2012-01-24 DIAGNOSIS — Z5181 Encounter for therapeutic drug level monitoring: Secondary | ICD-10-CM

## 2012-01-24 NOTE — Patient Instructions (Signed)
  Latest dosing instructions   Total Sun Mon Tue Wed Thu Fri Sat   35 5 mg 5 mg 5 mg 5 mg 5 mg 5 mg 5 mg    (5 mg1) (5 mg1) (5 mg1) (5 mg1) (5 mg1) (5 mg1) (5 mg1)        

## 2012-02-21 ENCOUNTER — Ambulatory Visit: Payer: Medicare Other | Admitting: Internal Medicine

## 2012-02-25 ENCOUNTER — Ambulatory Visit (INDEPENDENT_AMBULATORY_CARE_PROVIDER_SITE_OTHER): Payer: Medicare Other | Admitting: Internal Medicine

## 2012-02-25 DIAGNOSIS — I2510 Atherosclerotic heart disease of native coronary artery without angina pectoris: Secondary | ICD-10-CM

## 2012-02-25 DIAGNOSIS — I4891 Unspecified atrial fibrillation: Secondary | ICD-10-CM

## 2012-02-25 DIAGNOSIS — Z7901 Long term (current) use of anticoagulants: Secondary | ICD-10-CM

## 2012-02-25 DIAGNOSIS — E119 Type 2 diabetes mellitus without complications: Secondary | ICD-10-CM

## 2012-02-25 LAB — BASIC METABOLIC PANEL
CO2: 26 mEq/L (ref 19–32)
Calcium: 9.5 mg/dL (ref 8.4–10.5)
Chloride: 113 mEq/L — ABNORMAL HIGH (ref 96–112)
Glucose, Bld: 104 mg/dL — ABNORMAL HIGH (ref 70–99)
Sodium: 146 mEq/L — ABNORMAL HIGH (ref 135–145)

## 2012-02-25 LAB — LIPID PANEL
LDL Cholesterol: 80 mg/dL (ref 0–99)
Total CHOL/HDL Ratio: 3
Triglycerides: 160 mg/dL — ABNORMAL HIGH (ref 0.0–149.0)

## 2012-02-25 LAB — HEPATIC FUNCTION PANEL
ALT: 16 U/L (ref 0–53)
AST: 25 U/L (ref 0–37)
Bilirubin, Direct: 0.1 mg/dL (ref 0.0–0.3)
Total Protein: 6.8 g/dL (ref 6.0–8.3)

## 2012-02-25 LAB — POCT INR: INR: 2.2

## 2012-02-25 NOTE — Progress Notes (Signed)
Patient ID: Maurice Wood, male   DOB: 1933-01-01, 78 y.o.   MRN: [id number] Patient Active Problem List  Diagnoses  . Marland Kitchen Urinary incontinence-- reviewed note from urology    .   .   .   .   .   Marland Kitchen CORONARY ARTERY DISEASE-- no sxs  . Atrial fibrillation--no known recurrence---no palpitations--on warfarin  . DISSECTION, CAROTID ARTERY---no known sxs/recurrence    CBGs at home are controlled: 100-120.  Past Medical History  Diagnosis Date  . Atrial fibrillation Jan 2007    echo 1-07 normal ejection fraction, no significant valvular disease. adenosine cardiolite 1-07  no evidence of ischemia. A 48 hour Holter monitor in 7-07 showed good rate controlw/ chronic atrial fibrillation. Cath 1/11 normal cors. EF 45%. Echo 2-11 60-65%  . Obstructive sleep apnea     noncompliant with CPAP  . Cerebral aneurysm     followed by Dr Corliss Skains  . History of chest pain     Cardia catheterization 1999 was normal. myoview 2007 normal. cardiac cath jan 2011 normal cors EF 45%  . Obesity   . Hypertension   . Fatigue   . Allergy     rhinitis  . Hx of colonic polyps     diverticulosis  . Hyperlipidemia   . Retinal vein occlusion   . Benign prostatic hypertrophy   . IBS (irritable bowel syndrome)   . Diabetes mellitus     Elevated glucose/pre-dm  . Incontinence     Per pt 12/14/11    History   Social History  . Marital Status: Married    Spouse Name: N/A    Number of Children: N/A  . Years of Education: N/A   Occupational History  . Not on file.   Social History Main Topics  . Smoking status: Former Games developer  . Smokeless tobacco: Not on file   Comment: quit 1973  . Alcohol Use: Yes  . Drug Use: Not on file  . Sexually Active: Not on file   Other Topics Concern  . Not on file   Social History Narrative  . No narrative on file    Past Surgical History  Procedure Date  . Cholecystectomy   . Inguinal and umbilical herniorrhaphy   . Tonsillectomy and adenoidectomy as a child  .  Cardiac catheterization 1999    Family History  Problem Relation Age of Onset  . Cancer Father     lung  . Alcohol abuse Father   . Diabetes Son   . Cancer Maternal Grandmother     colon  . Diabetes Maternal Uncle   . Diabetes Paternal Uncle     Allergies  Allergen Reactions  . Clarithromycin     REACTION: Headache  . Epinephrine     REACTION: Increased Heart Rate  . Nimodipine     REACTION: Flushing    Current Outpatient Prescriptions on File Prior to Visit  Medication Sig Dispense Refill  . aspirin 81 MG tablet Take 81 mg by mouth daily.        . Calcium Carbonate-Vitamin D (CALCIUM + D) 600-200 MG-UNIT TABS Take by mouth.        . cetirizine (ZYRTEC) 10 MG tablet Take 10 mg by mouth daily.        . cholestyramine (QUESTRAN) 4 GM/DOSE powder Take 4 g by mouth 2 (two) times daily with a meal.        . fluticasone (FLONASE) 50 MCG/ACT nasal spray 2 sprays by Nasal route daily.  16 g  5  . Multiple Vitamin (MULTIVITAMIN) tablet Take 1 tablet by mouth daily.        . VOLTAREN 1 % GEL As needed      . warfarin (COUMADIN) 5 MG tablet TAKE ONE TABLET BY MOUTH EVERY DAY AS DIRECTED.  100 tablet  11     patient denies chest pain, shortness of breath, orthopnea. Denies lower extremity edema, abdominal pain, change in appetite, change in bowel movements. Patient denies rashes, musculoskeletal complaints. No other specific complaints in a complete review of systems.   There were no vitals taken for this visit.  well-developed well-nourished male in no acute distress. HEENT exam atraumatic, normocephalic, neck supple without jugular venous distention. Chest clear to auscultation cardiac exam S1-S2 are regular. Abdominal exam overweight with bowel sounds, soft and nontender. Extremities no edema. Neurologic exam is alert with a normal gait.

## 2012-02-25 NOTE — Assessment & Plan Note (Signed)
No known recurrence-- Continue warfarin

## 2012-02-25 NOTE — Assessment & Plan Note (Signed)
Check labs today.

## 2012-02-25 NOTE — Patient Instructions (Signed)
  Latest dosing instructions   Total Sun Mon Tue Wed Thu Fri Sat   35 5 mg 5 mg 5 mg 5 mg 5 mg 5 mg 5 mg    (5 mg1) (5 mg1) (5 mg1) (5 mg1) (5 mg1) (5 mg1) (5 mg1)        

## 2012-02-25 NOTE — Assessment & Plan Note (Signed)
No sxs Risk factor modification 

## 2012-02-28 ENCOUNTER — Other Ambulatory Visit: Payer: Self-pay | Admitting: Internal Medicine

## 2012-02-28 DIAGNOSIS — R040 Epistaxis: Secondary | ICD-10-CM

## 2012-03-14 ENCOUNTER — Encounter: Payer: Medicare Other | Attending: Internal Medicine | Admitting: *Deleted

## 2012-03-14 ENCOUNTER — Encounter: Payer: Self-pay | Admitting: *Deleted

## 2012-03-14 VITALS — Ht 70.0 in | Wt 191.5 lb

## 2012-03-14 DIAGNOSIS — E119 Type 2 diabetes mellitus without complications: Secondary | ICD-10-CM

## 2012-03-14 DIAGNOSIS — Z713 Dietary counseling and surveillance: Secondary | ICD-10-CM | POA: Insufficient documentation

## 2012-03-14 DIAGNOSIS — R7309 Other abnormal glucose: Secondary | ICD-10-CM | POA: Insufficient documentation

## 2012-03-14 NOTE — Patient Instructions (Signed)
Goals:  Continue previous nutrition and exercise goals.  Follow up prn.

## 2012-03-14 NOTE — Progress Notes (Addendum)
Medical Nutrition Therapy:  Appt start time: 0930 end time:  1030.  Assessment:  Other Abnormal Glucose (790.29).  Pt and wife here for F/U and report "doing the best they can to stay away from starches".  Repeat A1c on 02/25/12 of 5.8% noted.  Pt reports MD said all his labs were fine. Pt is checking BGs ~1 time/week; fasting results from Feb '13-present include: 116, 110, 118, 115, 112, 120, 108, 106 mg. Has decreased sweets at night and uses splenda "for everything". Continues to eat out often, but tries to make better choices. No physical activity reported.  MEDICATIONS: See medication list; Reports taking fish oil (2g daily).   DIETARY INTAKE:  Continues usual eating pattern of 3 meals and 0-1 snacks per day.  Usual physical activity: None  Estimated energy needs: 1500 calories 170 g carbohydrates 110-115 g protein 40-42 g fat  Progress Towards Goal(s):  In progress.   Nutritional Diagnosis:  [place]-2.2 Altered nutrition-related laboratory related to glucose metabolism as evidenced by recent A1c values of 6.3% and 5.8%  (05/29/11,11/09/11 respectively) and MD referral for assessment and education.  Handout Given:  Types of Dietary Fat/Omega 3 Fatty Acids    Intervention/Goals:  Continue previous nutrition and exercise goals.  Follow up prn.  Samples Dispensed:  Accu-Check Nano meter (starter kit) Lot # [id number] Exp: 06/08/13  Monitoring/Evaluation:  Dietary intake, exercise, A1c (as available), and body weight prn.

## 2012-03-27 ENCOUNTER — Ambulatory Visit: Payer: Medicare Other | Admitting: *Deleted

## 2012-03-27 DIAGNOSIS — Z7901 Long term (current) use of anticoagulants: Secondary | ICD-10-CM

## 2012-03-27 LAB — POCT INR: INR: 1.9

## 2012-03-27 NOTE — Patient Instructions (Signed)
  Latest dosing instructions   Total Sun Mon Tue Wed Thu Fri Sat   35 5 mg 5 mg 5 mg 5 mg 5 mg 5 mg 5 mg    (5 mg1) (5 mg1) (5 mg1) (5 mg1) (5 mg1) (5 mg1) (5 mg1)        

## 2012-04-08 ENCOUNTER — Other Ambulatory Visit: Payer: Self-pay | Admitting: Neurosurgery

## 2012-04-08 DIAGNOSIS — I729 Aneurysm of unspecified site: Secondary | ICD-10-CM

## 2012-04-11 DIAGNOSIS — H35372 Puckering of macula, left eye: Secondary | ICD-10-CM | POA: Insufficient documentation

## 2012-04-11 DIAGNOSIS — H348392 Tributary (branch) retinal vein occlusion, unspecified eye, stable: Secondary | ICD-10-CM | POA: Insufficient documentation

## 2012-04-11 DIAGNOSIS — H35352 Cystoid macular degeneration, left eye: Secondary | ICD-10-CM | POA: Insufficient documentation

## 2012-04-12 ENCOUNTER — Ambulatory Visit
Admission: RE | Admit: 2012-04-12 | Discharge: 2012-04-12 | Disposition: A | Discharge status: Home / Self Care | Payer: Medicare Other | Source: Ambulatory Visit | Attending: Neurosurgery | Admitting: Neurosurgery

## 2012-04-12 DIAGNOSIS — I729 Aneurysm of unspecified site: Secondary | ICD-10-CM

## 2012-04-12 MED ORDER — GADOBENATE DIMEGLUMINE 529 MG/ML IV SOLN
18.0000 mL | Freq: Once | INTRAVENOUS | Status: AC | PRN
  Administered 2012-04-12: 18 mL via INTRAVENOUS

## 2012-04-23 ENCOUNTER — Ambulatory Visit (INDEPENDENT_AMBULATORY_CARE_PROVIDER_SITE_OTHER): Payer: Medicare Other | Admitting: Internal Medicine

## 2012-04-23 DIAGNOSIS — Z7901 Long term (current) use of anticoagulants: Secondary | ICD-10-CM

## 2012-04-23 LAB — POCT INR: INR: 2

## 2012-04-23 NOTE — Patient Instructions (Signed)
  Latest dosing instructions   Total Sun Mon Tue Wed Thu Fri Sat   35 5 mg 5 mg 5 mg 5 mg 5 mg 5 mg 5 mg    (5 mg1) (5 mg1) (5 mg1) (5 mg1) (5 mg1) (5 mg1) (5 mg1)        

## 2012-04-24 ENCOUNTER — Ambulatory Visit: Payer: Medicare Other

## 2012-05-27 ENCOUNTER — Ambulatory Visit (INDEPENDENT_AMBULATORY_CARE_PROVIDER_SITE_OTHER): Payer: Medicare Other | Admitting: Family

## 2012-05-27 DIAGNOSIS — Z7901 Long term (current) use of anticoagulants: Secondary | ICD-10-CM

## 2012-05-27 LAB — POCT INR: INR: 2.2

## 2012-05-27 NOTE — Patient Instructions (Addendum)
  Latest dosing instructions   Total Sun Mon Tue Wed Thu Fri Sat   35 5 mg 5 mg 5 mg 5 mg 5 mg 5 mg 5 mg    (5 mg1) (5 mg1) (5 mg1) (5 mg1) (5 mg1) (5 mg1) (5 mg1)        

## 2012-06-26 ENCOUNTER — Ambulatory Visit (INDEPENDENT_AMBULATORY_CARE_PROVIDER_SITE_OTHER): Payer: Medicare Other | Admitting: Family

## 2012-06-26 DIAGNOSIS — Z7901 Long term (current) use of anticoagulants: Secondary | ICD-10-CM

## 2012-06-26 NOTE — Patient Instructions (Addendum)
Same dose, 5 mg everydays,check in 6 weeks     Latest dosing instructions   Total Sun Mon Tue Wed Thu Fri Sat   35 5 mg 5 mg 5 mg 5 mg 5 mg 5 mg 5 mg    (5 mg1) (5 mg1) (5 mg1) (5 mg1) (5 mg1) (5 mg1) (5 mg1)

## 2012-07-03 ENCOUNTER — Ambulatory Visit (INDEPENDENT_AMBULATORY_CARE_PROVIDER_SITE_OTHER)
Admission: RE | Admit: 2012-07-03 | Discharge: 2012-07-03 | Disposition: A | Discharge status: Home / Self Care | Payer: Medicare Other | Source: Ambulatory Visit | Attending: Internal Medicine | Admitting: Internal Medicine

## 2012-07-03 ENCOUNTER — Encounter: Payer: Self-pay | Admitting: Internal Medicine

## 2012-07-03 ENCOUNTER — Telehealth: Payer: Self-pay | Admitting: Internal Medicine

## 2012-07-03 ENCOUNTER — Ambulatory Visit (INDEPENDENT_AMBULATORY_CARE_PROVIDER_SITE_OTHER): Payer: Medicare Other | Admitting: Internal Medicine

## 2012-07-03 VITALS — BP 120/82 | Temp 97.9°F | Wt 194.0 lb

## 2012-07-03 DIAGNOSIS — R079 Chest pain, unspecified: Secondary | ICD-10-CM

## 2012-07-03 DIAGNOSIS — R0789 Other chest pain: Secondary | ICD-10-CM

## 2012-07-03 DIAGNOSIS — R296 Repeated falls: Secondary | ICD-10-CM | POA: Insufficient documentation

## 2012-07-03 DIAGNOSIS — Z9181 History of falling: Secondary | ICD-10-CM

## 2012-07-03 DIAGNOSIS — G629 Polyneuropathy, unspecified: Secondary | ICD-10-CM

## 2012-07-03 DIAGNOSIS — G609 Hereditary and idiopathic neuropathy, unspecified: Secondary | ICD-10-CM

## 2012-07-03 DIAGNOSIS — I4891 Unspecified atrial fibrillation: Secondary | ICD-10-CM

## 2012-07-03 MED ORDER — HYDROCODONE-ACETAMINOPHEN 5-500 MG PO TABS: 0.5000 | ORAL_TABLET | Freq: Three times a day (TID) | ORAL | Status: AC | PRN

## 2012-07-03 NOTE — Patient Instructions (Signed)
Seek emergency medical care if pain in your sternum gets worse or you experience shortness of breath

## 2012-07-03 NOTE — Progress Notes (Signed)
Subjective:    Patient ID: Maurice Wood, male    DOB: Jan 11, 1933, 78 y.o.   MRN: [id number]  HPI  78 year old white male with history of coronary artery disease, atrial fibrillation on anticoagulation and type 2 diabetes complains of significant pain in the sternum. He suffered a fall between 9:30 and 10 AM this morning. No precipitating factor for fall but he fell forward skinning his knee and hitting his chest across arm of chair. He now describes sternal pain that is worse with coughing and sneezing. Severity of pain is rated as 7-1/2-8/10 with taking a deep breath. His discomfort is currently 4/10.  He has had 2-3 falls within the last year. He has history of peripheral neuropathy.  Review of Systems Mild dizziness,  He denies shortness of breath.  Mild bruising across his chest  Past Medical History  Diagnosis Date  . Atrial fibrillation Jan 2007    echo 1-07 normal ejection fraction, no significant valvular disease. adenosine cardiolite 1-07  no evidence of ischemia. A 48 hour Holter monitor in 7-07 showed good rate controlw/ chronic atrial fibrillation. Cath 1/11 normal cors. EF 45%. Echo 2-11 60-65%  . Obstructive sleep apnea     noncompliant with CPAP  . Cerebral aneurysm     followed by Dr Corliss Skains  . History of chest pain     Cardia catheterization 1999 was normal. myoview 2007 normal. cardiac cath jan 2011 normal cors EF 45%  . Obesity   . Hypertension   . Fatigue   . Allergy     rhinitis  . Hx of colonic polyps     diverticulosis  . Hyperlipidemia   . Retinal vein occlusion   . Benign prostatic hypertrophy   . IBS (irritable bowel syndrome)   . Diabetes mellitus     Elevated glucose/pre-dm  . Incontinence     Per pt 12/14/11    History   Social History  . Marital Status: Married    Spouse Name: N/A    Number of Children: N/A  . Years of Education: N/A   Occupational History  . Not on file.   Social History Main Topics  . Smoking status: Former Games developer    . Smokeless tobacco: Not on file   Comment: quit 1973  . Alcohol Use: Yes  . Drug Use: Not on file  . Sexually Active: Not on file   Other Topics Concern  . Not on file   Social History Narrative  . No narrative on file    Past Surgical History  Procedure Date  . Cholecystectomy   . Inguinal and umbilical herniorrhaphy   . Tonsillectomy and adenoidectomy as a child  . Cardiac catheterization 1999    Family History  Problem Relation Age of Onset  . Cancer Father     lung  . Alcohol abuse Father   . Diabetes Son   . Cancer Maternal Grandmother     colon  . Diabetes Maternal Uncle   . Diabetes Paternal Uncle     Allergies  Allergen Reactions  . Clarithromycin     REACTION: Headache  . Epinephrine     REACTION: Increased Heart Rate  . Nimodipine     REACTION: Flushing    Current Outpatient Prescriptions on File Prior to Visit  Medication Sig Dispense Refill  . Calcium Carbonate-Vitamin D (CALCIUM + D) 600-200 MG-UNIT TABS Take by mouth.        . cetirizine (ZYRTEC) 10 MG tablet Take 10 mg by mouth daily.        Marland Kitchen  cholestyramine (QUESTRAN) 4 GM/DOSE powder Take 4 g by mouth 2 (two) times daily with a meal.        . Mirabegron ER (MYRBETRIQ) 50 MG TB24 Take by mouth daily.      . Multiple Vitamin (MULTIVITAMIN) tablet Take 1 tablet by mouth daily.        . VOLTAREN 1 % GEL As needed      . warfarin (COUMADIN) 5 MG tablet TAKE ONE TABLET BY MOUTH EVERY DAY AS DIRECTED.  100 tablet  11  . DISCONTD: fluticasone (FLONASE) 50 MCG/ACT nasal spray 2 sprays by Nasal route daily.  16 g  5    BP 120/82  Temp 97.9 F (36.6 C) (Oral)  Wt 194 lb (87.998 kg)  SpO2 97%       Objective:   Physical Exam  Constitutional: He is oriented to person, place, and time. He appears well-developed and well-nourished.  Cardiovascular: Normal rate, regular rhythm and normal heart sounds.        No muffled heart sounds  Pulmonary/Chest: Effort normal and breath sounds normal. He  has no wheezes.       Sternal tenderness.  Mild bruise near right breast.  Abdominal: Soft. Bowel sounds are normal. There is no tenderness.  Neurological: He is alert and oriented to person, place, and time.       Slightly wide-based gait, decreased sensation to vibration of feet bilaterally  Psychiatric: He has a normal mood and affect. His behavior is normal.          Assessment & Plan:

## 2012-07-03 NOTE — Telephone Encounter (Signed)
After speaking with dr. Artist Pais- he will see if intermittent pain - if continuous - to ER Spoke with wife - appt made for 4pm today - understands instructions if chest pain becomes continous

## 2012-07-03 NOTE — Assessment & Plan Note (Signed)
78 year old white male with history of A. fib on anticoagulation suffered fall today.  He fell forward injuring his sternum. Question sternal fracture. Obtain chest x-ray. We discussed other possible complications such as hemopericardium.  I suggest patient hold aspirin and fish oil supplement for 7-10 days. His last INR was therapeutic at 2.1 on 06/26/2012.  Patient understands to seek emergency medical care should pain worsen or he experiences shortness of breath.

## 2012-07-03 NOTE — Assessment & Plan Note (Signed)
This is the second or third fall patient has experienced within the last year. His peripheral neuropathy may be a contributing factor. Refer to physical therapy for further evaluation and treatment.

## 2012-07-03 NOTE — Assessment & Plan Note (Signed)
Patient experiencing frequent falls. If fall pattern persists, he may need to reassess risk vs benefit of anticoagulation. Patient will discuss this issue with his cardiologist at his next office visit.

## 2012-07-03 NOTE — Telephone Encounter (Signed)
Caller: Maryruth Eve; PCP: [name]; CB#: [phone]; Call regarding Injury/Trauma;  Fell at work approx 0900 07/03/12 stiking chest on chair.  Is on Coumadin. No active bleeding or cuts.  Mild abrasions present on knees.  Painful to move or take deep breath. Bruising present on L side of chest. Advised to see MD now for blunt trauma and pain with each breath per Chest Injury Guideline.  Next appt  not until 1600 07/03/12.  Called office nurse/Sandra d/t no immediate appt.  D/t injury at work, will check with MD and call pt.  Message sent to [facility] for immediate call back.

## 2012-07-04 ENCOUNTER — Telehealth: Payer: Self-pay | Admitting: Internal Medicine

## 2012-07-04 NOTE — Telephone Encounter (Signed)
Caller: Fotios/Patient; PCP: [name]; CB#: [phone];  Call regarding Seen in Office On 07/04/12 after fell and hit chest/sternum on wooden chair Jonni Sanger X-Ray Done and He Is Wanting To Know Results.Taking Hydrocodone for pain and is doing better today-07/04/12  PLEASE F/U WITH PATIENT TO LET HIM KNOW IF ANY FRACTURES SEEN ON XRAY.

## 2012-07-04 NOTE — Telephone Encounter (Signed)
Pt aware.

## 2012-07-09 ENCOUNTER — Ambulatory Visit: Payer: Medicare Other | Attending: Internal Medicine

## 2012-07-09 DIAGNOSIS — IMO0001 Reserved for inherently not codable concepts without codable children: Secondary | ICD-10-CM | POA: Insufficient documentation

## 2012-07-09 DIAGNOSIS — R269 Unspecified abnormalities of gait and mobility: Secondary | ICD-10-CM | POA: Insufficient documentation

## 2012-07-09 DIAGNOSIS — R262 Difficulty in walking, not elsewhere classified: Secondary | ICD-10-CM | POA: Insufficient documentation

## 2012-07-11 ENCOUNTER — Telehealth: Payer: Self-pay | Admitting: Internal Medicine

## 2012-07-11 ENCOUNTER — Emergency Department (HOSPITAL_COMMUNITY)
Admission: EM | Admit: 2012-07-11 | Discharge: 2012-07-11 | Disposition: A | Discharge status: Home / Self Care | Payer: Medicare Other | Source: Home / Self Care | Attending: Emergency Medicine | Admitting: Emergency Medicine

## 2012-07-11 ENCOUNTER — Encounter (HOSPITAL_COMMUNITY): Payer: Self-pay | Admitting: Emergency Medicine

## 2012-07-11 ENCOUNTER — Emergency Department (HOSPITAL_COMMUNITY): Payer: Medicare Other

## 2012-07-11 ENCOUNTER — Ambulatory Visit: Payer: Medicare Other | Attending: Internal Medicine

## 2012-07-11 DIAGNOSIS — Z7901 Long term (current) use of anticoagulants: Secondary | ICD-10-CM | POA: Insufficient documentation

## 2012-07-11 DIAGNOSIS — R0602 Shortness of breath: Secondary | ICD-10-CM | POA: Insufficient documentation

## 2012-07-11 DIAGNOSIS — I10 Essential (primary) hypertension: Secondary | ICD-10-CM | POA: Insufficient documentation

## 2012-07-11 DIAGNOSIS — G4733 Obstructive sleep apnea (adult) (pediatric): Secondary | ICD-10-CM | POA: Insufficient documentation

## 2012-07-11 DIAGNOSIS — R0789 Other chest pain: Secondary | ICD-10-CM

## 2012-07-11 DIAGNOSIS — R262 Difficulty in walking, not elsewhere classified: Secondary | ICD-10-CM | POA: Insufficient documentation

## 2012-07-11 DIAGNOSIS — I4891 Unspecified atrial fibrillation: Secondary | ICD-10-CM | POA: Insufficient documentation

## 2012-07-11 DIAGNOSIS — R071 Chest pain on breathing: Secondary | ICD-10-CM | POA: Insufficient documentation

## 2012-07-11 DIAGNOSIS — IMO0001 Reserved for inherently not codable concepts without codable children: Secondary | ICD-10-CM | POA: Insufficient documentation

## 2012-07-11 DIAGNOSIS — R7309 Other abnormal glucose: Secondary | ICD-10-CM | POA: Insufficient documentation

## 2012-07-11 DIAGNOSIS — R269 Unspecified abnormalities of gait and mobility: Secondary | ICD-10-CM | POA: Insufficient documentation

## 2012-07-11 DIAGNOSIS — Z79899 Other long term (current) drug therapy: Secondary | ICD-10-CM | POA: Insufficient documentation

## 2012-07-11 DIAGNOSIS — E785 Hyperlipidemia, unspecified: Secondary | ICD-10-CM | POA: Insufficient documentation

## 2012-07-11 LAB — PRO B NATRIURETIC PEPTIDE: Pro B Natriuretic peptide (BNP): 633.5 pg/mL — ABNORMAL HIGH (ref 0–450)

## 2012-07-11 LAB — CBC
Hemoglobin: 14.1 g/dL (ref 13.0–17.0)
Platelets: 140 10*3/uL — ABNORMAL LOW (ref 150–400)
RBC: 4.75 MIL/uL (ref 4.22–5.81)
WBC: 6.2 10*3/uL (ref 4.0–10.5)

## 2012-07-11 LAB — BASIC METABOLIC PANEL
CO2: 25 mEq/L (ref 19–32)
Calcium: 9.5 mg/dL (ref 8.4–10.5)
Chloride: 104 mEq/L (ref 96–112)
Glucose, Bld: 107 mg/dL — ABNORMAL HIGH (ref 70–99)
Potassium: 4.1 mEq/L (ref 3.5–5.1)
Sodium: 138 mEq/L (ref 135–145)

## 2012-07-11 LAB — PROTIME-INR
INR: 2.51 — ABNORMAL HIGH (ref 0.00–1.49)
Prothrombin Time: 27.5 seconds — ABNORMAL HIGH (ref 11.6–15.2)

## 2012-07-11 LAB — POCT I-STAT TROPONIN I: Troponin i, poc: 0 ng/mL (ref 0.00–0.08)

## 2012-07-11 MED ORDER — MORPHINE SULFATE 4 MG/ML IJ SOLN
4.0000 mg | Freq: Once | INTRAMUSCULAR | Status: AC
  Administered 2012-07-11: 4 mg via INTRAVENOUS
  Filled 2012-07-11: qty 1

## 2012-07-11 MED ORDER — ONDANSETRON HCL 4 MG/2ML IJ SOLN
4.0000 mg | Freq: Once | INTRAMUSCULAR | Status: AC
  Administered 2012-07-11: 4 mg via INTRAVENOUS
  Filled 2012-07-11: qty 2

## 2012-07-11 MED ORDER — OXYCODONE-ACETAMINOPHEN 5-325 MG PO TABS: 2.0000 | ORAL_TABLET | ORAL | Status: DC | PRN

## 2012-07-11 MED ORDER — MORPHINE SULFATE 2 MG/ML IJ SOLN
2.0000 mg | Freq: Once | INTRAMUSCULAR | Status: DC
  Filled 2012-07-11: qty 1

## 2012-07-11 NOTE — Telephone Encounter (Signed)
Caller: Mary/Spouse; PCP: [name]; CB#: [phone]; ; ; Call regarding Chest Pain/Chest Discomfort THE PATIENT REFUSED 911; pt fell 07/03/12 and hurt chest and has been having some chest pain since then; hurts to take a deep breath; chest pain has been off and on for the last week;  pt started with CP today; the pain was worse today than the other days; rated pain 8-9/10 today; located midsternal area; pain last 3-4 minutes; took Tylenol; pain is now rated 7/10; Triaged per Chest Pain Guideline; Call 911 now and go to ER; wife will take by private vehicle to [facility]; pt is on Coumadin and was told not to take ASA;

## 2012-07-11 NOTE — ED Provider Notes (Signed)
History     CSN: [id number]  Arrival date & time 07/11/12  1010   First MD Initiated Contact with Patient 07/11/12 1044      Chief Complaint  Patient presents with  . Fall  . Chest Pain    (Consider location/radiation/quality/duration/timing/severity/associated sxs/prior treatment) HPI History from patient. 77 year old male with past medical history of chronic atrial fibrillation presents with chest pain. He reports that he fell about a week ago while in a waiting room and struck his chest on the arm of a chair. He is unsure if he tripped or exactly what happened. He sustained bruising across his chest from this and had significant pain to the area. He saw his PCP. A chest x-ray was performed which was normal per his report. He has had persistent intermittent pain to the area since that time.  He reports that he was at rehabilitation this morning and did several exercises of the lower extremities (no upper body exercises). On the way home, at approximately 9:30 AM, he took a deep breath and noted that his chest pain worsened and she developed some shortness of breath with this. He denies any nausea, vomiting, diaphoresis, palpitations associated with this. The pain was described as bandlike and located to the same area of the bruising. The pain did not feel significantly different than the previous chest wall pain. No known aggravating or alleviating factors with this. He reports that the pain at its max this morning was 8/10 and is now approximately 4/10. No treatment at home prior to coming here.  Patient is followed by Dr. Gala Romney for his atrial fibrillation. He reports that he has no known history of CAD. Reviewed previous chart indicates that the patient had a cardiac cath in January 2011 with normal coronaries and an ejection fraction of 45%.  Past Medical History  Diagnosis Date  . Atrial fibrillation Jan 2007    echo 1-07 normal ejection fraction, no significant valvular disease.  adenosine cardiolite 1-07  no evidence of ischemia. A 48 hour Holter monitor in 7-07 showed good rate controlw/ chronic atrial fibrillation. Cath 1/11 normal cors. EF 45%. Echo 2-11 60-65%  . Obstructive sleep apnea     noncompliant with CPAP  . Cerebral aneurysm     followed by Dr Corliss Skains  . History of chest pain     Cardia catheterization 1999 was normal. myoview 2007 normal. cardiac cath jan 2011 normal cors EF 45%  . Obesity   . Hypertension   . Fatigue   . Allergy     rhinitis  . Hx of colonic polyps     diverticulosis  . Hyperlipidemia   . Retinal vein occlusion   . Benign prostatic hypertrophy   . IBS (irritable bowel syndrome)   . Diabetes mellitus     Elevated glucose/pre-dm  . Incontinence     Per pt 12/14/11    Past Surgical History  Procedure Date  . Cholecystectomy   . Inguinal and umbilical herniorrhaphy   . Tonsillectomy and adenoidectomy as a child  . Cardiac catheterization 1999    Family History  Problem Relation Age of Onset  . Cancer Father     lung  . Alcohol abuse Father   . Diabetes Son   . Cancer Maternal Grandmother     colon  . Diabetes Maternal Uncle   . Diabetes Paternal Uncle     History  Substance Use Topics  . Smoking status: Former Games developer  . Smokeless tobacco: Not on file  Comment: quit 1973  . Alcohol Use: Yes      Review of Systems  Constitutional: Negative for fever and chills.  Respiratory: Positive for shortness of breath. Negative for chest tightness.   Cardiovascular: Positive for chest pain. Negative for palpitations.  Gastrointestinal: Negative for nausea, vomiting and abdominal pain.  Musculoskeletal: Positive for myalgias.  Skin: Negative for color change and rash.  Neurological: Negative for dizziness, weakness and light-headedness.  All other systems reviewed and are negative.    Allergies  Clarithromycin; Epinephrine; and Nimodipine  Home Medications   Current Outpatient Rx  Name Route Sig Dispense  Refill  . CALCIUM CARBONATE-VITAMIN D 600-200 MG-UNIT PO TABS Oral Take 1 tablet by mouth 2 (two) times daily.     Marland Kitchen CETIRIZINE HCL 10 MG PO TABS Oral Take 10 mg by mouth daily as needed. allergies    . CHOLESTYRAMINE 4 GM/DOSE PO POWD Oral Take 4 g by mouth daily.     Marland Kitchen FLUTICASONE PROPIONATE 50 MCG/ACT NA SUSP Nasal Place 2 sprays into the nose daily.    Marland Kitchen HYDROCODONE-ACETAMINOPHEN 5-500 MG PO TABS Oral Take 0.5 tablets by mouth every 8 (eight) hours as needed for pain. 20 tablet 0  . MIRABEGRON ER 50 MG PO TB24 Oral Take 50 mg by mouth daily.     Marland Kitchen ONE-DAILY MULTI VITAMINS PO TABS Oral Take 1 tablet by mouth daily.      . WARFARIN SODIUM 5 MG PO TABS Oral Take 5 mg by mouth daily.      BP 134/85  Pulse 71  Temp 98.1 F (36.7 C) (Oral)  Resp 20  SpO2 96%  Physical Exam  Nursing note and vitals reviewed. Constitutional: He appears well-developed and well-nourished. No distress.  HENT:  Head: Normocephalic and atraumatic.  Mouth/Throat: Oropharynx is clear and moist.  Eyes:       Normal appearance  Neck: Normal range of motion. Neck supple.  Cardiovascular: Normal rate, regular rhythm, normal heart sounds and intact distal pulses.  Exam reveals no gallop and no friction rub.   No murmur heard. Pulmonary/Chest: Effort normal and breath sounds normal.         Bruising across right chest wall as diagrammed, tender to palp to the same, no crepitus/stepoff  Abdominal: Soft. There is no tenderness. There is no rebound and no guarding.  Musculoskeletal: Normal range of motion. He exhibits no edema.  Neurological: He is alert.  Skin: Skin is warm and dry. He is not diaphoretic.  Psychiatric: He has a normal mood and affect.    ED Course  Procedures (including critical care time)   Date: 07/11/2012  Rate: 83  Rhythm: atrial fibrillation  QRS Axis: normal  Intervals: na  ST/T Wave abnormalities: nonspecific ST changes  Conduction Disutrbances:none  Narrative Interpretation:    Old EKG Reviewed: as compared with Nov 2012 no sig changes  Labs Reviewed  CBC - Abnormal; Notable for the following:    Platelets 140 (*)     All other components within normal limits  BASIC METABOLIC PANEL - Abnormal; Notable for the following:    Glucose, Bld 107 (*)     GFR calc non Af Amer 80 (*)     All other components within normal limits  PRO B NATRIURETIC PEPTIDE - Abnormal; Notable for the following:    Pro B Natriuretic peptide (BNP) 633.5 (*)     All other components within normal limits  PROTIME-INR - Abnormal; Notable for the following:    Prothrombin  Time 27.5 (*)     INR 2.51 (*)     All other components within normal limits  POCT I-STAT TROPONIN I   Dg Chest 2 View  07/11/2012  *RADIOLOGY REPORT*  Clinical Data: [name] 1 week ago with the anterior right chest pain and bruising  CHEST - 2 VIEW  Comparison: Chest x-ray of 07/03/2012  Findings: The lungs remain clear but somewhat hyperaerated.  No focal infiltrate or effusion is seen.  Mild left basilar linear atelectasis or scarring is noted.  The heart is within upper limits normal.  The bones are osteopenic and there are degenerative changes in the thoracic spine.  No definite rib fracture is seen by chest x-ray.  IMPRESSION: Slight hyperaeration with left basilar linear atelectasis or scarring.  No definite fracture is seen by chest x-ray.  Original Report Authenticated By: Juline Patch, M.D.     1. Chest wall pain       MDM  Patient presents after a fall approximately one week ago with bruising to his anterior chest. Was seen by his PCP and had a negative chest x-ray. Reports that he had increased pain today after taking a deep breath. On exam, he has ecchymoses and tenderness to palpation over the right anterior chest. His EKG is without acute changes. Troponin is negative x1. INR is therapeutic. Repeat chest x-ray again demonstrates no definite rib fractures or sternal injury. Negative cardiac cath in 2011. Given  findings on exam, highly suspicious for chest wall injury. Patient was given IV morphine and had relief of his symptoms with this. Patient instructed to followup with his PCP. He is scheduled for an appointment next week. He has Vicodin at home to take which he was instructed to continue. He has only been taking half a tablet every 8 hours as needed. Instructed that he can try taking half a tablet every 4 hours as needed, with a stool softener. Reasons to return to the emergency department discussed. Patient and wife at bedside verbalized understanding and agreed to plan.       Grant Fontana, PA-C 07/11/12 1347

## 2012-07-11 NOTE — ED Notes (Signed)
Pt c/o sternum and CP since falling on chest 1 week ago; pt seen at PCP and had xrays that were negative; pt noted to have bruising to chest; pt sts SOB also

## 2012-07-11 NOTE — ED Provider Notes (Signed)
Medical screening examination/treatment/procedure(s) were conducted as a shared visit with non-physician practitioner(s) and myself.  I personally evaluated the patient during the encounter  Maurice Wood is a 78 y.o. male with hx of afib on coumadin here with CP. [name] a week ago and had bruise on R breast. Today, after rehab, tried to take deep breath and had some pain. Not exertional, no cough, no SOB no recent travel.   PEX- comfortable. + bruising on L chest, +bilateral breath sounds and no crackles. No leg swelling.   MDM Patient likely has MSK pain from injury. CXR showed no fracture. Trop x 1 neg, EKG unchanged. Will manage his pain symptomatically and have him f/u outpatient.    Richardean Canal, MD 07/11/12 [phone]

## 2012-07-11 NOTE — Telephone Encounter (Signed)
ED Notification 

## 2012-07-13 ENCOUNTER — Emergency Department (HOSPITAL_COMMUNITY): Payer: Medicare Other

## 2012-07-13 ENCOUNTER — Inpatient Hospital Stay (HOSPITAL_COMMUNITY)
Admission: EM | Admit: 2012-07-13 | Discharge: 2012-07-15 | DRG: 195 | Disposition: A | Discharge status: Home / Self Care | Payer: Medicare Other | Attending: Internal Medicine | Admitting: Internal Medicine

## 2012-07-13 ENCOUNTER — Encounter (HOSPITAL_COMMUNITY): Payer: Self-pay

## 2012-07-13 DIAGNOSIS — F29 Unspecified psychosis not due to a substance or known physiological condition: Secondary | ICD-10-CM

## 2012-07-13 DIAGNOSIS — K648 Other hemorrhoids: Secondary | ICD-10-CM

## 2012-07-13 DIAGNOSIS — Z833 Family history of diabetes mellitus: Secondary | ICD-10-CM

## 2012-07-13 DIAGNOSIS — R0789 Other chest pain: Secondary | ICD-10-CM

## 2012-07-13 DIAGNOSIS — R296 Repeated falls: Secondary | ICD-10-CM

## 2012-07-13 DIAGNOSIS — R269 Unspecified abnormalities of gait and mobility: Secondary | ICD-10-CM

## 2012-07-13 DIAGNOSIS — I671 Cerebral aneurysm, nonruptured: Secondary | ICD-10-CM | POA: Diagnosis present

## 2012-07-13 DIAGNOSIS — R410 Disorientation, unspecified: Secondary | ICD-10-CM | POA: Diagnosis present

## 2012-07-13 DIAGNOSIS — F528 Other sexual dysfunction not due to a substance or known physiological condition: Secondary | ICD-10-CM

## 2012-07-13 DIAGNOSIS — E291 Testicular hypofunction: Secondary | ICD-10-CM

## 2012-07-13 DIAGNOSIS — E119 Type 2 diabetes mellitus without complications: Secondary | ICD-10-CM

## 2012-07-13 DIAGNOSIS — Z860100 Personal history of colon polyps, unspecified: Secondary | ICD-10-CM

## 2012-07-13 DIAGNOSIS — I4891 Unspecified atrial fibrillation: Secondary | ICD-10-CM

## 2012-07-13 DIAGNOSIS — Z87891 Personal history of nicotine dependence: Secondary | ICD-10-CM

## 2012-07-13 DIAGNOSIS — R972 Elevated prostate specific antigen [PSA]: Secondary | ICD-10-CM

## 2012-07-13 DIAGNOSIS — K5730 Diverticulosis of large intestine without perforation or abscess without bleeding: Secondary | ICD-10-CM

## 2012-07-13 DIAGNOSIS — I2510 Atherosclerotic heart disease of native coronary artery without angina pectoris: Secondary | ICD-10-CM

## 2012-07-13 DIAGNOSIS — G4730 Sleep apnea, unspecified: Secondary | ICD-10-CM

## 2012-07-13 DIAGNOSIS — D649 Anemia, unspecified: Secondary | ICD-10-CM

## 2012-07-13 DIAGNOSIS — Z7901 Long term (current) use of anticoagulants: Secondary | ICD-10-CM

## 2012-07-13 DIAGNOSIS — G4733 Obstructive sleep apnea (adult) (pediatric): Secondary | ICD-10-CM

## 2012-07-13 DIAGNOSIS — K589 Irritable bowel syndrome without diarrhea: Secondary | ICD-10-CM

## 2012-07-13 DIAGNOSIS — Z9181 History of falling: Secondary | ICD-10-CM

## 2012-07-13 DIAGNOSIS — N400 Enlarged prostate without lower urinary tract symptoms: Secondary | ICD-10-CM

## 2012-07-13 DIAGNOSIS — R2681 Unsteadiness on feet: Secondary | ICD-10-CM

## 2012-07-13 DIAGNOSIS — K219 Gastro-esophageal reflux disease without esophagitis: Secondary | ICD-10-CM

## 2012-07-13 DIAGNOSIS — E785 Hyperlipidemia, unspecified: Secondary | ICD-10-CM

## 2012-07-13 DIAGNOSIS — J189 Pneumonia, unspecified organism: Principal | ICD-10-CM | POA: Diagnosis present

## 2012-07-13 DIAGNOSIS — I7771 Dissection of carotid artery: Secondary | ICD-10-CM

## 2012-07-13 DIAGNOSIS — R509 Fever, unspecified: Secondary | ICD-10-CM

## 2012-07-13 DIAGNOSIS — R4182 Altered mental status, unspecified: Secondary | ICD-10-CM | POA: Diagnosis present

## 2012-07-13 LAB — POCT I-STAT, CHEM 8
Calcium, Ion: 1.17 mmol/L (ref 1.13–1.30)
Chloride: 105 mEq/L (ref 96–112)
Glucose, Bld: 210 mg/dL — ABNORMAL HIGH (ref 70–99)
HCT: 42 % (ref 39.0–52.0)
Hemoglobin: 14.3 g/dL (ref 13.0–17.0)

## 2012-07-13 LAB — CBC WITH DIFFERENTIAL/PLATELET
Basophils Absolute: 0 10*3/uL (ref 0.0–0.1)
Basophils Relative: 0 % (ref 0–1)
HCT: 40.2 % (ref 39.0–52.0)
MCHC: 33.6 g/dL (ref 30.0–36.0)
Monocytes Absolute: 0.5 10*3/uL (ref 0.1–1.0)
Neutro Abs: 11.5 10*3/uL — ABNORMAL HIGH (ref 1.7–7.7)
Platelets: 142 10*3/uL — ABNORMAL LOW (ref 150–400)
RDW: 14 % (ref 11.5–15.5)

## 2012-07-13 LAB — COMPREHENSIVE METABOLIC PANEL
AST: 25 U/L (ref 0–37)
Albumin: 3.3 g/dL — ABNORMAL LOW (ref 3.5–5.2)
Calcium: 8.6 mg/dL (ref 8.4–10.5)
Chloride: 103 mEq/L (ref 96–112)
Creatinine, Ser: 0.97 mg/dL (ref 0.50–1.35)
Sodium: 135 mEq/L (ref 135–145)
Total Bilirubin: 0.8 mg/dL (ref 0.3–1.2)

## 2012-07-13 LAB — HEPATIC FUNCTION PANEL
ALT: 12 U/L (ref 0–53)
Alkaline Phosphatase: 61 U/L (ref 39–117)
Indirect Bilirubin: 0.9 mg/dL (ref 0.3–0.9)
Total Bilirubin: 1.1 mg/dL (ref 0.3–1.2)
Total Protein: 6.4 g/dL (ref 6.0–8.3)

## 2012-07-13 LAB — APTT: aPTT: 41 seconds — ABNORMAL HIGH (ref 24–37)

## 2012-07-13 LAB — MAGNESIUM: Magnesium: 1.9 mg/dL (ref 1.5–2.5)

## 2012-07-13 LAB — PROTIME-INR
INR: 2.82 — ABNORMAL HIGH (ref 0.00–1.49)
Prothrombin Time: 30.1 seconds — ABNORMAL HIGH (ref 11.6–15.2)

## 2012-07-13 LAB — PRO B NATRIURETIC PEPTIDE: Pro B Natriuretic peptide (BNP): 868.5 pg/mL — ABNORMAL HIGH (ref 0–450)

## 2012-07-13 LAB — URINALYSIS, ROUTINE W REFLEX MICROSCOPIC
Glucose, UA: >1000 mg/dL — AB
Hgb urine dipstick: NEGATIVE
Specific Gravity, Urine: 1.025 (ref 1.005–1.030)

## 2012-07-13 LAB — URINE MICROSCOPIC-ADD ON

## 2012-07-13 LAB — POCT I-STAT TROPONIN I: Troponin i, poc: 0 ng/mL (ref 0.00–0.08)

## 2012-07-13 LAB — CARDIAC PANEL(CRET KIN+CKTOT+MB+TROPI): Relative Index: INVALID (ref 0.0–2.5)

## 2012-07-13 LAB — GLUCOSE, CAPILLARY
Glucose-Capillary: 190 mg/dL — ABNORMAL HIGH (ref 70–99)
Glucose-Capillary: 110 mg/dL — ABNORMAL HIGH (ref 70–99)

## 2012-07-13 MED ORDER — PIPERACILLIN-TAZOBACTAM 3.375 G IVPB
3.3750 g | Freq: Three times a day (TID) | INTRAVENOUS | Status: DC
  Administered 2012-07-14 – 2012-07-15 (×4): 3.375 g via INTRAVENOUS
  Filled 2012-07-13 (×6): qty 50

## 2012-07-13 MED ORDER — SODIUM CHLORIDE 0.9 % IV SOLN
INTRAVENOUS | Status: DC
  Administered 2012-07-14: 06:00:00 via INTRAVENOUS

## 2012-07-13 MED ORDER — PIPERACILLIN-TAZOBACTAM 3.375 G IVPB
3.3750 g | Freq: Once | INTRAVENOUS | Status: AC
  Administered 2012-07-13: 3.375 g via INTRAVENOUS
  Filled 2012-07-13: qty 50

## 2012-07-13 MED ORDER — WARFARIN SODIUM 5 MG PO TABS
5.0000 mg | ORAL_TABLET | Freq: Once | ORAL | Status: AC
  Administered 2012-07-13: 5 mg via ORAL
  Filled 2012-07-13: qty 1

## 2012-07-13 MED ORDER — LEVALBUTEROL HCL 0.63 MG/3ML IN NEBU
0.6300 mg | INHALATION_SOLUTION | Freq: Four times a day (QID) | RESPIRATORY_TRACT | Status: DC | PRN
  Filled 2012-07-13: qty 3

## 2012-07-13 MED ORDER — ENOXAPARIN SODIUM 40 MG/0.4ML ~~LOC~~ SOLN
40.0000 mg | SUBCUTANEOUS | Status: DC
  Filled 2012-07-13: qty 0.4

## 2012-07-13 MED ORDER — ASPIRIN 300 MG RE SUPP: 300.0000 mg | Freq: Every day | RECTAL | Status: DC

## 2012-07-13 MED ORDER — WARFARIN - PHARMACIST DOSING INPATIENT
Freq: Every day | Status: DC
  Administered 2012-07-14: 18:00:00

## 2012-07-13 MED ORDER — ACETAMINOPHEN 650 MG RE SUPP: 650.0000 mg | Freq: Four times a day (QID) | RECTAL | Status: DC | PRN

## 2012-07-13 MED ORDER — SODIUM CHLORIDE 0.9 % IJ SOLN: 3.0000 mL | Freq: Two times a day (BID) | INTRAMUSCULAR | Status: DC

## 2012-07-13 MED ORDER — MIRABEGRON ER 50 MG PO TB24
50.0000 mg | ORAL_TABLET | Freq: Every day | ORAL | Status: DC
  Administered 2012-07-14 – 2012-07-15 (×2): 50 mg via ORAL
  Filled 2012-07-13 (×3): qty 1

## 2012-07-13 MED ORDER — ASPIRIN 325 MG PO TABS: 325.0000 mg | ORAL_TABLET | Freq: Every day | ORAL | Status: DC

## 2012-07-13 MED ORDER — VANCOMYCIN HCL IN DEXTROSE 1-5 GM/200ML-% IV SOLN: 1000.0000 mg | Freq: Once | INTRAVENOUS | Status: DC

## 2012-07-13 MED ORDER — FLUTICASONE PROPIONATE 50 MCG/ACT NA SUSP
2.0000 | Freq: Every day | NASAL | Status: DC
Start: 2012-07-13 — End: 2012-07-15
  Administered 2012-07-14 – 2012-07-15 (×2): 2 via NASAL
  Filled 2012-07-13: qty 16

## 2012-07-13 MED ORDER — ONDANSETRON HCL 4 MG/2ML IJ SOLN: 4.0000 mg | Freq: Four times a day (QID) | INTRAMUSCULAR | Status: DC | PRN

## 2012-07-13 MED ORDER — ACETAMINOPHEN 325 MG PO TABS
650.0000 mg | ORAL_TABLET | Freq: Four times a day (QID) | ORAL | Status: DC | PRN
  Administered 2012-07-13 – 2012-07-14 (×2): 650 mg via ORAL
  Filled 2012-07-13 (×2): qty 2

## 2012-07-13 MED ORDER — SODIUM CHLORIDE 0.9 % IV SOLN
Freq: Once | INTRAVENOUS | Status: AC
  Administered 2012-07-13: 75 mL/h via INTRAVENOUS

## 2012-07-13 MED ORDER — SENNOSIDES-DOCUSATE SODIUM 8.6-50 MG PO TABS
1.0000 | ORAL_TABLET | Freq: Every evening | ORAL | Status: DC | PRN
  Filled 2012-07-13: qty 1

## 2012-07-13 MED ORDER — ONDANSETRON HCL 4 MG PO TABS: 4.0000 mg | ORAL_TABLET | Freq: Four times a day (QID) | ORAL | Status: DC | PRN

## 2012-07-13 MED ORDER — VANCOMYCIN HCL IN DEXTROSE 1-5 GM/200ML-% IV SOLN
1000.0000 mg | Freq: Two times a day (BID) | INTRAVENOUS | Status: DC
  Administered 2012-07-13 – 2012-07-15 (×4): 1000 mg via INTRAVENOUS
  Filled 2012-07-13 (×5): qty 200

## 2012-07-13 NOTE — ED Notes (Signed)
Per EMS, pt appears confused to time.  Pt A x O to name, place, situation.  EMS reports some unsteady gait unsure if this is pt's baseline.  Pt with hx of A-fib on Coumadin therapy, cerebral aneurysm, and a fall 2 weeks ago.  Pt's family states that pt ambulates with a cane and is currently in therapy for strength.  Pt's family reports that they had lunch at [employer] and pt needed to use restroom.  Family states that after using restroom pt didn't eat well afterwards.  When they were getting up to leave pt was unable to get up from chair.  Family also states that pt seemed very confused as if he "couldn't process what was going on or what he needed to do".

## 2012-07-13 NOTE — ED Provider Notes (Addendum)
History     CSN: [id number]  Arrival date & time 07/13/12  1559   First MD Initiated Contact with Patient 07/13/12 1619      Chief Complaint  Patient presents with  . Code Stroke    (Consider location/radiation/quality/duration/timing/severity/associated sxs/prior treatment) HPI Pt with increasing confusion and generalized weakness starting today. Came in as code stroke when was at restaurant and became increasing confused, staggering "like he was drunk" per family. Pt had decreased appetite and vomited once in the bathroom. Pt is intermittently oriented with variable strength. He complains of no pain.  Past Medical History  Diagnosis Date  . Atrial fibrillation Jan 2007    echo 1-07 normal ejection fraction, no significant valvular disease. adenosine cardiolite 1-07  no evidence of ischemia. A 48 hour Holter monitor in 7-07 showed good rate controlw/ chronic atrial fibrillation. Cath 1/11 normal cors. EF 45%. Echo 2-11 60-65%  . Obstructive sleep apnea     noncompliant with CPAP  . Cerebral aneurysm     followed by Dr Corliss Skains  . History of chest pain     Cardia catheterization 1999 was normal. myoview 2007 normal. cardiac cath jan 2011 normal cors EF 45%  . Obesity   . Hypertension   . Fatigue   . Allergy     rhinitis  . Hx of colonic polyps     diverticulosis  . Hyperlipidemia   . Retinal vein occlusion   . Benign prostatic hypertrophy   . IBS (irritable bowel syndrome)   . Diabetes mellitus     Elevated glucose/pre-dm  . Incontinence     Per pt 12/14/11    Past Surgical History  Procedure Date  . Cholecystectomy   . Inguinal and umbilical herniorrhaphy   . Tonsillectomy and adenoidectomy as a child  . Cardiac catheterization 1999    Family History  Problem Relation Age of Onset  . Cancer Father     lung  . Alcohol abuse Father   . Diabetes Son   . Cancer Maternal Grandmother     colon  . Diabetes Maternal Uncle   . Diabetes Paternal Uncle     History    Substance Use Topics  . Smoking status: Former Games developer  . Smokeless tobacco: Not on file   Comment: quit 1973  . Alcohol Use: Yes      Review of Systems  Constitutional: Positive for chills. Negative for fever.  Respiratory: Negative for cough and shortness of breath.   Gastrointestinal: Positive for vomiting. Negative for nausea, abdominal pain and diarrhea.  Genitourinary: Positive for frequency. Negative for dysuria.  Musculoskeletal: Positive for myalgias and gait problem.  Skin: Positive for wound.  Neurological: Positive for weakness and headaches. Negative for syncope and numbness.  Psychiatric/Behavioral: Positive for confusion.    Allergies  Clarithromycin; Epinephrine; and Nimodipine  Home Medications   Current Outpatient Rx  Name Route Sig Dispense Refill  . CALCIUM CARBONATE-VITAMIN D 600-200 MG-UNIT PO TABS Oral Take 1 tablet by mouth 2 (two) times daily.     Marland Kitchen CETIRIZINE HCL 10 MG PO TABS Oral Take 10 mg by mouth daily as needed. allergies    . CHOLESTYRAMINE 4 GM/DOSE PO POWD Oral Take 4 g by mouth daily.     Marland Kitchen FLUTICASONE PROPIONATE 50 MCG/ACT NA SUSP Nasal Place 2 sprays into the nose daily.    Marland Kitchen HYDROCODONE-ACETAMINOPHEN 5-500 MG PO TABS Oral Take 0.5 tablets by mouth every 8 (eight) hours as needed for pain. 20 tablet 0  .  MIRABEGRON ER 50 MG PO TB24 Oral Take 50 mg by mouth daily.     Marland Kitchen ONE-DAILY MULTI VITAMINS PO TABS Oral Take 1 tablet by mouth daily.      . WARFARIN SODIUM 5 MG PO TABS Oral Take 5 mg by mouth daily.    . OXYCODONE-ACETAMINOPHEN 5-325 MG PO TABS Oral Take 2 tablets by mouth every 4 (four) hours as needed for pain. 10 tablet 0    BP 106/63  Pulse 100  Temp 100.8 F (38.2 C) (Oral)  Resp 20  SpO2 97%  Physical Exam  Nursing note and vitals reviewed. Constitutional: He is oriented to person, place, and time. He appears well-developed and well-nourished. No distress.       Lying with eyes closed. No acute distress  HENT:  Head:  Normocephalic and atraumatic.       Dry MM  Eyes: EOM are normal. Pupils are equal, round, and reactive to light.  Neck: Normal range of motion. Neck supple.  Cardiovascular: Regular rhythm.        irreg irreg tachy  Pulmonary/Chest: Effort normal and breath sounds normal. No respiratory distress. He has no wheezes. He has no rales. He exhibits tenderness (R chest wall bruise with TTP. Splinting on R-side).  Abdominal: Soft. Bowel sounds are normal. There is no tenderness. There is no rebound and no guarding.  Musculoskeletal: Normal range of motion. He exhibits no edema and no tenderness.  Neurological: He is oriented to person, place, and time.       Pt oriented to person and time though varies. 5/5 LUE motor. 4/5 RUE motor. 3/5 bl LE motor. Decreased sensation bl lower ext. Finger to nose mildly impaired.   Skin: Skin is warm and dry. No rash noted. No erythema.  Psychiatric: He has a normal mood and affect. His behavior is normal.    ED Course  Procedures (including critical care time)  Labs Reviewed  CBC WITH DIFFERENTIAL - Abnormal; Notable for the following:    WBC 12.6 (*)     Platelets 142 (*)     Neutrophils Relative 92 (*)     Neutro Abs 11.5 (*)     Lymphocytes Relative 4 (*)     Lymphs Abs 0.5 (*)     All other components within normal limits  COMPREHENSIVE METABOLIC PANEL - Abnormal; Notable for the following:    Glucose, Bld 219 (*)     Albumin 3.3 (*)     GFR calc non Af Amer 78 (*)     All other components within normal limits  URINALYSIS, ROUTINE W REFLEX MICROSCOPIC - Abnormal; Notable for the following:    Glucose, UA >1000 (*)     Ketones, ur 40 (*)     All other components within normal limits  PROTIME-INR - Abnormal; Notable for the following:    Prothrombin Time 30.1 (*)     INR 2.82 (*)     All other components within normal limits  APTT - Abnormal; Notable for the following:    aPTT 41 (*)     All other components within normal limits  GLUCOSE,  CAPILLARY - Abnormal; Notable for the following:    Glucose-Capillary 190 (*)     All other components within normal limits  LACTIC ACID, PLASMA - Abnormal; Notable for the following:    Lactic Acid, Venous 2.7 (*)     All other components within normal limits  POCT I-STAT, CHEM 8 - Abnormal; Notable for the following:  Glucose, Bld 210 (*)     All other components within normal limits  POCT I-STAT TROPONIN I  URINE MICROSCOPIC-ADD ON  CULTURE, BLOOD (ROUTINE X 2)  CULTURE, BLOOD (ROUTINE X 2)  URINE CULTURE   Ct Head Wo Contrast  07/13/2012  *RADIOLOGY REPORT*  Clinical Data: Code stroke.  Unsteady gait.  Confusion  CT HEAD WITHOUT CONTRAST  Technique:  Contiguous axial images were obtained from the base of the skull through the vertex without contrast.  Comparison: 04/12/2012  Findings: Old infarct  is identified within the left cerebellar hemisphere.  This is unchanged from previous exam.  There is diffuse patchy low density throughout the subcortical and periventricular white matter consistent with chronic small vessel ischemic change.  There is no evidence for acute brain infarct, hemorrhage or mass.  The paranasal sinuses and mastoid air cells are clear.  IMPRESSION:  1.  Small vessel ischemic disease and brain infarct. 2.  No old left cerebellar hemisphere stroke.  Original Report Authenticated By: Rosealee Albee, M.D.   Dg Chest Port 1 View  07/13/2012  *RADIOLOGY REPORT*  Clinical Data: Stroke  PORTABLE CHEST - 1 VIEW  Comparison: 07/11/2012  Findings: Moderate cardiac enlargement and pulmonary venous congestion.  No effusion or edema.  No airspace consolidation. Review of the visualized osseous structures is unremarkable.  IMPRESSION:  1.  Cardiac enlargement and pulmonary venous congestion.  Original Report Authenticated By: Rosealee Albee, M.D.     1. Confusion   2. Fever      Date: 07/13/2012  Rate:113  Rhythm: atrial fibrillation  QRS Axis: normal  Intervals: normal   ST/T Wave abnormalities: nonspecific T wave changes  Conduction Disutrbances:none  Narrative Interpretation:   Old EKG Reviewed: unchanged    MDM  Seen by Dr Thad Ranger of neurology. In light of febrile presentation, elevated INR, variable exam and unlcear etiology pt is not candidate for tPA. Admit to medicine  Discussed finding with Dr Susie Cassette. Agree with broad spectrum ABX and will admit.       Loren Racer, MD 07/13/12 1850  Loren Racer, MD 07/13/12 [phone]

## 2012-07-13 NOTE — ED Notes (Signed)
Code Stroke cancelled d/t pt not a tPA candidate per Dr. Thad Ranger.

## 2012-07-13 NOTE — H&P (Addendum)
[facility] History and Physical  Maurice Wood WUJ:[id number] DOB: 11/13/1933 DOA: 07/13/2012  Referring physician:Latron Ranae Palms, MD PCP: Judie Petit, MD   Chief Complaint: Confusion, HPI:  78 yo male presenting with acute onset confusion and difficulty with gait. Patient has been having some recent problems with gait and actually has had a recent fall about a week ago, but the mental status issues are quite new for him. He has been started on Percocet 2 tablets every 4 hours. The patient took Percocet every 4 hours yesterday and only once today. WBC count is elevated and the patient is febrile as well. Mild localizing symptoms on exam. Patient with old cerebellar infarct on imaging but no acute changes seen. Although patient likely has infection which may be causing the current presentation with his multiple risk factors would rule out acute infarct as well. Patient is on Coumadin at home and presents with an INR of 2.51.It should also be noted that the patient is on pain medications for pain from his recent fall. He had two last evening and one before going to lunch.  Did not eat well which is unusual. Did get up to go to the bathroom and required some assistance but was able to ambulate. When they tried to get up to go home he was unable to get up from the chair. Required assistance to get to the car. Was quite confused and was unable to remember dates, name of his children, etc. That he would usually be able to recount. When the family got home patient was no better. EMS was called and patient was brought in as a code stroke       Review of Systems: negative for the following   Constitutional: Chills, Fever, Unexplained weight loss, Unexplained recent fatigue, Change in appetite  Constitutional: Positive for chills. Negative for fever.  Respiratory: Negative for cough and shortness of breath.  Gastrointestinal: Positive for vomiting. Negative for nausea, abdominal pain and  diarrhea.  Genitourinary: Positive for frequency. Negative for dysuria.  Musculoskeletal: Positive for myalgias and gait problem.  Skin: Positive for wound.  Neurological: Positive for weakness and headaches. Negative for syncope and numbness.  Psychiatric/Behavioral: Positive for confusion.   Hematologic/Lymphatic: Swollen glands, easy bruising   Allergic/Immunologic: Hives, runny nose, sneezing      Past Medical History  Diagnosis Date  . Atrial fibrillation Jan 2007    echo 1-07 normal ejection fraction, no significant valvular disease. adenosine cardiolite 1-07  no evidence of ischemia. A 48 hour Holter monitor in 7-07 showed good rate controlw/ chronic atrial fibrillation. Cath 1/11 normal cors. EF 45%. Echo 2-11 60-65%  . Obstructive sleep apnea     noncompliant with CPAP  . Cerebral aneurysm     followed by Dr Corliss Skains  . History of chest pain     Cardia catheterization 1999 was normal. myoview 2007 normal. cardiac cath jan 2011 normal cors EF 45%  . Obesity   . Hypertension   . Fatigue   . Allergy     rhinitis  . Hx of colonic polyps     diverticulosis  . Hyperlipidemia   . Retinal vein occlusion   . Benign prostatic hypertrophy   . IBS (irritable bowel syndrome)   . Diabetes mellitus     Elevated glucose/pre-dm  . Incontinence     Per pt 12/14/11     Past Surgical History  Procedure Date  . Cholecystectomy   . Inguinal and umbilical herniorrhaphy   . Tonsillectomy and adenoidectomy  as a child  . Cardiac catheterization 1999      Social History:  reports that he has quit smoking. He does not have any smokeless tobacco history on file. He reports that he drinks alcohol. His drug history not on file. Family History   Problem  Relation  Age of Onset   .  Cancer  Father       lung    .  Alcohol abuse  Father    .  Diabetes  Son    .  Cancer  Maternal Grandmother       colon    .  Diabetes  Maternal Uncle    .  Diabetes  Paternal Uncle     Social  History: reports that he has quit smoking. He does not have any smokeless tobacco history on file. He reports that he drinks alcohol. His drug history not on file.    Allergies  Allergen Reactions  . Clarithromycin     REACTION: Headache  . Epinephrine     REACTION: Increased Heart Rate  . Nimodipine     REACTION: Flushing    Family History  Problem Relation Age of Onset  . Cancer Father     lung  . Alcohol abuse Father   . Diabetes Son   . Cancer Maternal Grandmother     colon  . Diabetes Maternal Uncle   . Diabetes Paternal Uncle      Prior to Admission medications   Medication Sig Start Date End Date Taking? Authorizing Provider  Calcium Carbonate-Vitamin D (CALCIUM + D) 600-200 MG-UNIT TABS Take 1 tablet by mouth 2 (two) times daily.    Yes Historical Provider, MD  cetirizine (ZYRTEC) 10 MG tablet Take 10 mg by mouth daily as needed. allergies   Yes Historical Provider, MD  cholestyramine (QUESTRAN) 4 GM/DOSE powder Take 4 g by mouth daily.    Yes Historical Provider, MD  fluticasone (FLONASE) 50 MCG/ACT nasal spray Place 2 sprays into the nose daily. 03/13/11  Yes Lindley Magnus, MD  HYDROcodone-acetaminophen (VICODIN) 5-500 MG per tablet Take 0.5 tablets by mouth every 8 (eight) hours as needed for pain. 07/03/12 07/13/12 Yes Doe-Hyun R Artist Pais, DO  Mirabegron ER (MYRBETRIQ) 50 MG TB24 Take 50 mg by mouth daily.    Yes Historical Provider, MD  Multiple Vitamin (MULTIVITAMIN) tablet Take 1 tablet by mouth daily.     Yes Historical Provider, MD  warfarin (COUMADIN) 5 MG tablet Take 5 mg by mouth daily.   Yes Historical Provider, MD  oxyCODONE-acetaminophen (PERCOCET) 5-325 MG per tablet Take 2 tablets by mouth every 4 (four) hours as needed for pain. 07/11/12 07/21/12  Richardean Canal, MD     Physical Exam: Filed Vitals:   07/13/12 1618 07/13/12 1637 07/13/12 1802 07/13/12 1908  BP: 133/76  106/63   Pulse: 110  100   Temp:  101.7 F (38.7 C) 100.8 F (38.2 C) 100.8 F (38.2 C)    TempSrc:  Oral Oral   Resp: 20  20   Height:   5' 10.08" (1.78 m)   Weight:   88 kg (194 lb 0.1 oz)   SpO2: 93%  97%    Constitutional: He is oriented to person, place, and time. He appears well-developed and well-nourished. No distress.  Lying with eyes closed. No acute distress  HENT:  Head: Normocephalic and atraumatic.  Dry MM  Eyes: EOM are normal. Pupils are equal, round, and reactive to light.  Neck: Normal range of  motion. Neck supple.  Cardiovascular: Regular rhythm.  irreg irreg tachy  Pulmonary/Chest: Effort normal and breath sounds normal. No respiratory distress. He has no wheezes. He has no rales. He exhibits tenderness (R chest wall bruise with TTP. Splinting on R-side).  Abdominal: Soft. Bowel sounds are normal. There is no tenderness. There is no rebound and no guarding.  Musculoskeletal: Normal range of motion. He exhibits no edema and no tenderness.  Neurological: He is oriented to person, place, and time.  Pt oriented to person and time though varies. 5/5 LUE motor. 4/5 RUE motor. 3/5 bl LE motor. Decreased sensation bl lower ext. Finger to nose mildly impaired.  Skin: Skin is warm and dry. No rash noted. No erythema.  Psychiatric: He has a normal mood and affect. His behavior is normal.        Labs on Admission:    Basic Metabolic Panel:  Lab 07/13/12 0865 07/13/12 1615 07/11/12 1105  NA 139 135 138  K 4.2 4.3 4.1  CL 105 103 104  CO2 -- 22 25  GLUCOSE 210* 219* 107*  BUN 15 13 13   CREATININE 1.10 0.97 0.93  CALCIUM -- 8.6 9.5  MG -- -- --  PHOS -- -- --   Liver Function Tests:  Lab 07/13/12 1615  AST 25  ALT 16  ALKPHOS 67  BILITOT 0.8  PROT 6.7  ALBUMIN 3.3*   No results found for this basename: LIPASE:5,AMYLASE:5 in the last 168 hours No results found for this basename: AMMONIA:5 in the last 168 hours CBC:  Lab 07/13/12 1637 07/13/12 1615 07/11/12 1105  WBC -- 12.6* 6.2  NEUTROABS -- 11.5* --  HGB 14.3 13.5 14.1  HCT 42.0 40.2  41.9  MCV -- 89.7 88.2  PLT -- 142* 140*   Cardiac Enzymes: No results found for this basename: CKTOTAL:5,CKMB:5,CKMBINDEX:5,TROPONINI:5 in the last 168 hours  BNP (last 3 results)  Basename 07/11/12 1057  PROBNP 633.5*      CBG:  Lab 07/13/12 1624  GLUCAP 190*    Radiological Exams on Admission: Ct Head Wo Contrast  07/13/2012  *RADIOLOGY REPORT*  Clinical Data: Code stroke.  Unsteady gait.  Confusion  CT HEAD WITHOUT CONTRAST  Technique:  Contiguous axial images were obtained from the base of the skull through the vertex without contrast.  Comparison: 04/12/2012  Findings: Old infarct  is identified within the left cerebellar hemisphere.  This is unchanged from previous exam.  There is diffuse patchy low density throughout the subcortical and periventricular white matter consistent with chronic small vessel ischemic change.  There is no evidence for acute brain infarct, hemorrhage or mass.  The paranasal sinuses and mastoid air cells are clear.  IMPRESSION:  1.  Small vessel ischemic disease and brain infarct. 2.  No old left cerebellar hemisphere stroke.  Original Report Authenticated By: Rosealee Albee, M.D.   Dg Chest Port 1 View  07/13/2012  *RADIOLOGY REPORT*  Clinical Data: Stroke  PORTABLE CHEST - 1 VIEW  Comparison: 07/11/2012  Findings: Moderate cardiac enlargement and pulmonary venous congestion.  No effusion or edema.  No airspace consolidation. Review of the visualized osseous structures is unremarkable.  IMPRESSION:  1.  Cardiac enlargement and pulmonary venous congestion.  Original Report Authenticated By: Rosealee Albee, M.D.    EKG: Date: 07/13/2012  Rate:113  Rhythm: atrial fibrillation  QRS Axis: normal  Intervals: normal  ST/T Wave abnormalities: nonspecific T wave changes  Conduction Disutrbances:none  Narrative Interpretation:  Old EKG Reviewed: unchanged  Assessment/Plan Principal Problem:  *Gait instability Active Problems:   Confusion   Altered mental status, combination of narcotics, infection, most likely the sources in his lungs. The patient has improved significantly after receiving antibiotics. No gross focal neurologic deficits or meningismus to suggest meningitis. Continue to follow cultures, continue vancomycin and Zosyn  Atrial fibrillation next and continue Coumadin, rate controlled  Diabetes borderline diet-controlled check hemoglobin A1c  TIA/CVA? Doubt the patient has a stroke, complete stroke workup  PT OT eval in the morning     Code Status:   full Family Communication: bedside Disposition Plan: admit   Time spent: 70 mins   [facility] Pager [phone]  If 7PM-7AM, please contact night-coverage [email or web address] Password [facility] 07/13/2012, 8:42 PM

## 2012-07-13 NOTE — Progress Notes (Signed)
ANTIBIOTIC & ANTICOAGULATION CONSULT NOTE - Initial Consult  Pharmacy Consult for Vancomycin + Zosyn + Warfarin  Indication: r/o PNA and hx Afib  Allergies  Allergen Reactions  . Clarithromycin     REACTION: Headache  . Epinephrine     REACTION: Increased Heart Rate  . Nimodipine     REACTION: Flushing    Patient Measurements: Height: 5' 10.08" (178 cm) Weight: 194 lb 0.1 oz (88 kg) IBW/kg (Calculated) : 73.18   Vital Signs: Temp: 100.8 F (38.2 C) (08/04 1908) Temp src: Oral (08/04 1802) BP: 106/63 mmHg (08/04 1802) Pulse Rate: 100  (08/04 1802)  Labs:  Basename 07/13/12 1637 07/13/12 1615 07/11/12 1105  HGB 14.3 13.5 --  HCT 42.0 40.2 41.9  PLT -- 142* 140*  APTT -- 41* --  LABPROT -- 30.1* 27.5*  INR -- 2.82* 2.51*  HEPARINUNFRC -- -- --  CREATININE 1.10 0.97 0.93  CKTOTAL -- -- --  CKMB -- -- --  TROPONINI -- -- --    Estimated Creatinine Clearance: 63.9 ml/min (by C-G formula based on Cr of 1.1).   Medical History: Past Medical History  Diagnosis Date  . Atrial fibrillation Jan 2007    echo 1-07 normal ejection fraction, no significant valvular disease. adenosine cardiolite 1-07  no evidence of ischemia. A 48 hour Holter monitor in 7-07 showed good rate controlw/ chronic atrial fibrillation. Cath 1/11 normal cors. EF 45%. Echo 2-11 60-65%  . Obstructive sleep apnea     noncompliant with CPAP  . Cerebral aneurysm     followed by Dr Corliss Skains  . History of chest pain     Cardia catheterization 1999 was normal. myoview 2007 normal. cardiac cath jan 2011 normal cors EF 45%  . Obesity   . Hypertension   . Fatigue   . Allergy     rhinitis  . Hx of colonic polyps     diverticulosis  . Hyperlipidemia   . Retinal vein occlusion   . Benign prostatic hypertrophy   . IBS (irritable bowel syndrome)   . Diabetes mellitus     Elevated glucose/pre-dm  . Incontinence     Per pt 12/14/11    Medications:  Anti-infectives     Start     Dose/Rate Route  Frequency Ordered Stop   07/13/12 1830   piperacillin-tazobactam (ZOSYN) IVPB 3.375 g        3.375 g 12.5 mL/hr over 240 Minutes Intravenous  Once 07/13/12 1824     07/13/12 1830   vancomycin (VANCOCIN) IVPB 1000 mg/200 mL premix        1,000 mg 200 mL/hr over 60 Minutes Intravenous  Once 07/13/12 1824            Assessment: 78 y.o. M who [facility] the [facility] on 8[facility] as a code stroke with confusion an difficulty with gait. CT scan showed old cerebella infarct but no acute changes seen. INR was therapeutic on admission (INR 2.82, goal of 2-3). Pharmacy was consulted to start Vancomycin + Zosyn for empiric PNA coverage and to resume the patient's home warfarin for hx Afib. The patient received a dose of Zosyn at 1900 in the ED but has yet to receive any Vancomycin doses. His last dose of warfarin was on 8/3. PTA his home dose was warfarin was 5 mg daily. Hgb/Hct ok, plts 142.   Goal of Therapy:  INR 2-3 Vancomycin trough of 15-20 mcg/ml   Plan:  1. Zosyn 3.375g IV every 8 hours 2. Vancomycin 1g IV  every 12 hours 3. Warfarin 5 mg x 1 dose today 4. Daily PT/INR 5. Will continue to monitor for any signs/symptoms of bleeding and will follow up with PT/INR in the a.m. 6. Will continue to follow renal function, culture results, LOT, and antibiotic de-escalation plans   Georgina Pillion, PharmD, BCPS Clinical Pharmacist Pager: [phone] 07/13/2012 8:25 PM

## 2012-07-13 NOTE — Consult Note (Signed)
Reason for Consult: Code stroke Referring Physician: Ranae Palms  CC: Confusion, difficulty with gait  HPI: Maurice Wood is an 78 y.o. male who was at lunch with family.  Did not eat well which is unusual.  Did get up to go to the bathroom and required some assistance but was able to ambulate.  When they tried to get up to go home he was unable to get up from the chair.  Required assistance to get to the car.  Was quite confused and was unable to tel dates, etc. That he would usually be able to recount.  When the family got home patient was no better.  EMS was called and patient was brought in as a code stroke.  LSN: [id number] tPA given: No; INR 2.51  Past Medical History  Diagnosis Date  . Atrial fibrillation Jan 2007    echo 1-07 normal ejection fraction, no significant valvular disease. adenosine cardiolite 1-07  no evidence of ischemia. A 48 hour Holter monitor in 7-07 showed good rate controlw/ chronic atrial fibrillation. Cath 1/11 normal cors. EF 45%. Echo 2-11 60-65%  . Obstructive sleep apnea     noncompliant with CPAP  . Cerebral aneurysm     followed by Dr Corliss Skains  . History of chest pain     Cardia catheterization 1999 was normal. myoview 2007 normal. cardiac cath jan 2011 normal cors EF 45%  . Obesity   . Hypertension   . Fatigue   . Allergy     rhinitis  . Hx of colonic polyps     diverticulosis  . Hyperlipidemia   . Retinal vein occlusion   . Benign prostatic hypertrophy   . IBS (irritable bowel syndrome)   . Diabetes mellitus     Elevated glucose/pre-dm  . Incontinence     Per pt 12/14/11    Past Surgical History  Procedure Date  . Cholecystectomy   . Inguinal and umbilical herniorrhaphy   . Tonsillectomy and adenoidectomy as a child  . Cardiac catheterization 1999    Family History  Problem Relation Age of Onset  . Cancer Father     lung  . Alcohol abuse Father   . Diabetes Son   . Cancer Maternal Grandmother     colon  . Diabetes Maternal Uncle   .  Diabetes Paternal Uncle     Social History:  reports that he has quit smoking. He does not have any smokeless tobacco history on file. He reports that he drinks alcohol. His drug history not on file.  Allergies  Allergen Reactions  . Clarithromycin     REACTION: Headache  . Epinephrine     REACTION: Increased Heart Rate  . Nimodipine     REACTION: Flushing    Medications: I have reviewed the patient's current medications. Prior to Admission:  Current outpatient prescriptions:Calcium Carbonate-Vitamin D (CALCIUM + D) 600-200 MG-UNIT TABS, Take 1 tablet by mouth 2 (two) times daily. , Disp: , Rfl: ;  cetirizine (ZYRTEC) 10 MG tablet, Take 10 mg by mouth daily as needed. allergies, Disp: , Rfl: ;  cholestyramine (QUESTRAN) 4 GM/DOSE powder, Take 4 g by mouth daily. , Disp: , Rfl: ;  fluticasone (FLONASE) 50 MCG/ACT nasal spray, Place 2 sprays into the nose daily., Disp: , Rfl:  HYDROcodone-acetaminophen (VICODIN) 5-500 MG per tablet, Take 0.5 tablets by mouth every 8 (eight) hours as needed for pain., Disp: 20 tablet, Rfl: 0;  Mirabegron ER (MYRBETRIQ) 50 MG TB24, Take 50 mg by mouth daily. ,  Disp: , Rfl: ;  Multiple Vitamin (MULTIVITAMIN) tablet, Take 1 tablet by mouth daily.  , Disp: , Rfl: ;  warfarin (COUMADIN) 5 MG tablet, Take 5 mg by mouth daily., Disp: , Rfl:  oxyCODONE-acetaminophen (PERCOCET) 5-325 MG per tablet, Take 2 tablets by mouth every 4 (four) hours as needed for pain., Disp: 10 tablet, Rfl: 0  ROS: History obtained from the patient  General ROS: negative for - chills, fatigue, fever, night sweats, weight gain or weight loss Psychological ROS: negative for - behavioral disorder, hallucinations, memory difficulties, mood swings or suicidal ideation Ophthalmic ROS: negative for - blurry vision, double vision, eye pain or loss of vision ENT ROS: negative for - epistaxis, nasal discharge, oral lesions, sore throat, tinnitus or vertigo Allergy and Immunology ROS: negative for -  hives or itchy/watery eyes Hematological and Lymphatic ROS: negative for - bleeding problems, bruising or swollen lymph nodes Endocrine ROS: negative for - galactorrhea, hair pattern changes, polydipsia/polyuria or temperature intolerance Respiratory ROS: negative for - cough, hemoptysis, shortness of breath or wheezing Cardiovascular ROS: negative for - chest pain, dyspnea on exertion, edema or irregular heartbeat Gastrointestinal ROS: negative for - abdominal pain, diarrhea, hematemesis, nausea/vomiting or stool incontinence Genito-Urinary ROS: negative for - dysuria, hematuria, incontinence or urinary frequency/urgency Musculoskeletal ROS: chest pain Neurological ROS: as noted in HPI Dermatological ROS: negative for rash and skin lesion changes  Physical Examination: Blood pressure 133/76, pulse 110, temperature 101.7 F (38.7 C), temperature source Oral, resp. rate 20, SpO2 93.00%.  Neurologic Examination Mental Status: Alert.  Knows the day of the week and the year but is unable to tell me the month.  Knows he is at the hospital.  Had difficulty naming objects on the NIHSS page and was unable to read complete sentences.  Was able to repeat.  Able to follow 3 step commands without difficulty. Cranial Nerves: II: Discs flat bilaterally; visual fields grossly normal, pupils equal, round, reactive to light and accommodation III,IV, VI: ptosis not present, extra-ocular motions intact bilaterally V,VII: smile symmetric, facial light touch sensation normal bilaterally VIII: hearing normal bilaterally IX,X: gag reflex present XI: trapezius strength/neck flexion strength normal bilaterally XII: tongue strength normal  Motor: Right : Upper extremity   5/5    Left:     Upper extremity   5/5  Lower extremity   3-/5     Lower extremity   4+/5 Tone and bulk:normal tone throughout; no atrophy noted Sensory: Pinprick and light touch intact throughout, bilaterally Deep Tendon Reflexes: 1+ in the  upper extremities and absent in the lower extremities.   Plantars: Right: equivocal   Left: downgoing Cerebellar: normal finger-to-nose  Laboratory Studies:   Basic Metabolic Panel:  Lab 07/11/12 1610  NA 138  K 4.1  CL 104  CO2 25  GLUCOSE 107*  BUN 13  CREATININE 0.93  CALCIUM 9.5  MG --  PHOS --    Liver Function Tests: No results found for this basename: AST:5,ALT:5,ALKPHOS:5,BILITOT:5,PROT:5,ALBUMIN:5 in the last 168 hours No results found for this basename: LIPASE:5,AMYLASE:5 in the last 168 hours No results found for this basename: AMMONIA:3 in the last 168 hours  CBC:  Lab 07/13/12 1615 07/11/12 1105  WBC 12.6* 6.2  NEUTROABS 11.5* --  HGB 13.5 14.1  HCT 40.2 41.9  MCV 89.7 88.2  PLT 142* 140*    Cardiac Enzymes: No results found for this basename: CKTOTAL:5,CKMB:5,CKMBINDEX:5,TROPONINI:5 in the last 168 hours  BNP: No components found with this basename: POCBNP:5  CBG:  Lab  07/13/12 1624  GLUCAP 190*    Microbiology: No results found for this or any previous visit.  Coagulation Studies:  Basename 07/13/12 1615 2012-07-29 1105  LABPROT 30.1* 27.5*  INR 2.82* 2.51*    Urinalysis: No results found for this basename: COLORURINE:2,APPERANCEUR:2,LABSPEC:2,PHURINE:2,GLUCOSEU:2,HGBUR:2,BILIRUBINUR:2,KETONESUR:2,PROTEINUR:2,UROBILINOGEN:2,NITRITE:2,LEUKOCYTESUR:2 in the last 168 hours  Lipid Panel:     Component Value Date/Time   CHOL 166 02/25/2012 0927   TRIG 160.0* 02/25/2012 0927   HDL 54.40 02/25/2012 0927   CHOLHDL 3 02/25/2012 0927   VLDL 32.0 02/25/2012 0927   LDLCALC 80 02/25/2012 0927    HgbA1C:  Lab Results  Component Value Date   HGBA1C 5.8 02/25/2012    Urine Drug Screen:   No results found for this basename: labopia, cocainscrnur, labbenz, amphetmu, thcu, labbarb    Alcohol Level: No results found for this basename: ETH:2 in the last 168 hours  Imaging: Ct Head Wo Contrast  07/13/2012  *RADIOLOGY REPORT*  Clinical Data: Code  stroke.  Unsteady gait.  Confusion  CT HEAD WITHOUT CONTRAST  Technique:  Contiguous axial images were obtained from the base of the skull through the vertex without contrast.  Comparison: 04/12/2012  Findings: Old infarct  is identified within the left cerebellar hemisphere.  This is unchanged from previous exam.  There is diffuse patchy low density throughout the subcortical and periventricular white matter consistent with chronic small vessel ischemic change.  There is no evidence for acute brain infarct, hemorrhage or mass.  The paranasal sinuses and mastoid air cells are clear.  IMPRESSION:  1.  Small vessel ischemic disease and brain infarct. 2.  No old left cerebellar hemisphere stroke.  Original Report Authenticated By: Rosealee Albee, M.D.     Assessment: 78 yo male presenting with acute onset confusion and difficulty with gait.  Patient has been having some recent problems with gait and actually has had a recent fall but the mental status issues are quite new for him.  WBC count is elevated and the patient is febrile as well.  Mild localizing symptoms on exam.  Patient with old cerebellar infarct on imaging but no acute changes seen.  Although patient likely has infection which may be causing the current presentation with his multiple risk factors would rule out acute infarct as well.  Patient is on Coumadin at home and presents with an INR of 2.51 therefore he is not a Coumadin candidate.  It should also be noted that the patient is on pain medications for pain from his recent fall.  He had two last evening and one before going to lunch.    Stroke Risk Factors: Atrial fibrillation, Hypertension, Hyperlipidemia, Diabetes and stroke in the past.    Plan: 1.  Agree with work up for infection 2.  MRI of the brain without contrast.  Would not initiate stroke work up unless an acute infarct noted on imaging.   3. Continue Coumadin   Thana Farr, MD [facility] [phone] 07/13/2012, 4:40 PM

## 2012-07-14 ENCOUNTER — Encounter (HOSPITAL_COMMUNITY): Payer: Self-pay | Admitting: *Deleted

## 2012-07-14 ENCOUNTER — Inpatient Hospital Stay (HOSPITAL_COMMUNITY): Payer: Medicare Other

## 2012-07-14 DIAGNOSIS — R404 Transient alteration of awareness: Secondary | ICD-10-CM

## 2012-07-14 LAB — LIPID PANEL
HDL: 56 mg/dL (ref >39)
LDL Cholesterol: 53 mg/dL (ref 0–99)
Total CHOL/HDL Ratio: 2.3 RATIO
Triglycerides: 89 mg/dL (ref <150)
VLDL: 18 mg/dL (ref 0–40)

## 2012-07-14 LAB — COMPREHENSIVE METABOLIC PANEL
AST: 16 U/L (ref 0–37)
BUN: 13 mg/dL (ref 6–23)
CO2: 22 mEq/L (ref 19–32)
Calcium: 8.3 mg/dL — ABNORMAL LOW (ref 8.4–10.5)
Chloride: 109 mEq/L (ref 96–112)
Creatinine, Ser: 0.89 mg/dL (ref 0.50–1.35)
GFR calc non Af Amer: 81 mL/min — ABNORMAL LOW (ref >90)
Total Bilirubin: 1 mg/dL (ref 0.3–1.2)

## 2012-07-14 LAB — CARDIAC PANEL(CRET KIN+CKTOT+MB+TROPI)
CK, MB: 1.7 ng/mL (ref 0.3–4.0)
Relative Index: INVALID (ref 0.0–2.5)
Total CK: 49 U/L (ref 7–232)

## 2012-07-14 LAB — GLUCOSE, CAPILLARY
Glucose-Capillary: 125 mg/dL — ABNORMAL HIGH (ref 70–99)
Glucose-Capillary: 150 mg/dL — ABNORMAL HIGH (ref 70–99)

## 2012-07-14 LAB — HEMOGLOBIN A1C
Hgb A1c MFr Bld: 6 % — ABNORMAL HIGH (ref <5.7)
Mean Plasma Glucose: 126 mg/dL — ABNORMAL HIGH (ref <117)

## 2012-07-14 LAB — PROTIME-INR
INR: 2.78 — ABNORMAL HIGH (ref 0.00–1.49)
Prothrombin Time: 29.8 seconds — ABNORMAL HIGH (ref 11.6–15.2)

## 2012-07-14 MED ORDER — WARFARIN SODIUM 5 MG PO TABS
5.0000 mg | ORAL_TABLET | Freq: Every day | ORAL | Status: DC
  Administered 2012-07-14: 5 mg via ORAL
  Filled 2012-07-14 (×2): qty 1

## 2012-07-14 NOTE — Progress Notes (Signed)
Occupational Therapy Evaluation Patient Details Name: BRAELYN BORDONARO MRN: [id number] DOB: 1933-07-07 Today's Date: 07/14/2012 Time: 4098-1191 OT Time Calculation (min): 22 min  OT Assessment / Plan / Recommendation Clinical Impression  Pt is a 78 y/o male admitted s/p fall with confusion (CT and MRI -). Pt would benefit from acute OT to maximize independence and safety in ADLs prior to d/c home with HHOT.     OT Assessment  Patient needs continued OT Services    Follow Up Recommendations  Home health OT    Barriers to Discharge  None.    Equipment Recommendations  Other (comment) (TBD)    Recommendations for Other Services    Frequency  Min 2X/week    Precautions / Restrictions Precautions Precautions: Fall Restrictions Weight Bearing Restrictions: No   Pertinent Vitals/Pain Soreness in chest area but did not quantify.    ADL  Grooming: Performed;Teeth care;Wash/dry face;Min guard Where Assessed - Grooming: Supported standing Lower Body Dressing: Performed;Supervision/safety Where Assessed - Lower Body Dressing: Unsupported sitting Toilet Transfer: Performed;Minimal assistance Toilet Transfer Method: Sit to Barista: Regular height toilet Toileting - Clothing Manipulation and Hygiene: Performed;Min guard Where Assessed - Engineer, mining and Hygiene: Standing Equipment Used: Gait belt ADL Comments: Pt. doffed socks while sitting EOB with Supervision. Pt. performed toilet transfer with Min A for balance. Pt. performed grooming tasks at sink with Min G. Spouse and pt. verbalized that he is having difficult with LB dressing.     OT Diagnosis: Generalized weakness;Acute pain  OT Problem List: Decreased strength;Decreased activity tolerance;Impaired balance (sitting and/or standing);Decreased knowledge of use of DME or AE;Pain OT Treatment Interventions: Self-care/ADL training;DME and/or AE instruction;Therapeutic activities;Patient/family  education;Balance training   OT Goals Acute Rehab OT Goals OT Goal Formulation: With patient Time For Goal Achievement: 07/28/12 Potential to Achieve Goals: Good ADL Goals Pt Will Perform Grooming: with modified independence;Standing at sink ADL Goal: Grooming - Progress: Goal set today Pt Will Perform Lower Body Bathing: with modified independence;Sit to stand from chair ADL Goal: Lower Body Bathing - Progress: Goal set today Pt Will Perform Lower Body Dressing: with modified independence;Sit to stand from chair;Sit to stand from bed ADL Goal: Lower Body Dressing - Progress: Goal set today Pt Will Transfer to Toilet: with modified independence;Ambulation ADL Goal: Toilet Transfer - Progress: Goal set today Pt Will Perform Toileting - Clothing Manipulation: with modified independence;Standing ADL Goal: Toileting - Clothing Manipulation - Progress: Goal set today Pt Will Perform Toileting - Hygiene: with modified independence;Standing at 3-in-1/toilet ADL Goal: Toileting - Hygiene - Progress: Goal set today  Visit Information  Last OT Received On: 07/14/12 Assistance Needed: +1 PT/OT Co-Evaluation/Treatment: Yes    Subjective Data  Subjective: Pt. and family verbalized that he has difficulty with LB dressing.   Prior Functioning  Vision/Perception  Home Living Lives With: Spouse Available Help at Discharge: Family;Available PRN/intermittently (available most of the time) Type of Home: House Home Access: Stairs to enter [employer] of Steps: 2 Entrance Stairs-Rails: None Home Layout: One level Bathroom Shower/Tub: Walk-in shower;Door [employer] Toilet: Handicapped height Bathroom Accessibility: Yes How Accessible: Accessible via walker Home Adaptive Equipment: Straight cane;Shower chair without back;Grab bars in shower;Hand-held shower hose Prior Function Level of Independence: Independent Able to Take Stairs?: Yes Driving: Yes Vocation:  Retired Musician: No difficulties Dominant Hand: Right      Cognition  Overall Cognitive Status: Appears within functional limits for tasks assessed/performed Arousal/Alertness: Awake/alert Orientation Level: Appears intact for tasks assessed Behavior During  Session: [facility] for tasks performed    Extremity/Trunk Assessment Right Upper Extremity Assessment RUE ROM/Strength/Tone: [facility] for tasks assessed Left Upper Extremity Assessment LUE ROM/Strength/Tone: WFL for tasks assessed Right Lower Extremity Assessment RLE ROM/Strength/Tone: Within functional levels RLE Sensation: WFL - Light Touch RLE Coordination: WFL - gross/fine motor Left Lower Extremity Assessment LLE ROM/Strength/Tone: Within functional levels LLE Sensation: WFL - Light Touch LLE Coordination: WFL - gross/fine motor Trunk Assessment Trunk Assessment: Normal   Mobility Bed Mobility Bed Mobility: Supine to Sit;Sit to Supine Supine to Sit: 4: Min assist;HOB flat Sit to Supine: 4: Min assist;HOB flat Details for Bed Mobility Assistance: Assist to trunk to translate anterior and slow descent to surface with cues for safe sequence. Transfers Transfers: Sit to Stand;Stand to Sit Sit to Stand: 4: Min assist;With upper extremity assist;From bed;From toilet Stand to Sit: 4: Min assist;With upper extremity assist;To toilet;To bed Details for Transfer Assistance: Assist for balance with cues for safest hand placement.         End of Session OT - End of Session Activity Tolerance: Patient tolerated treatment well Patient left: in bed;with call bell/phone within reach;with family/visitor present;with bed alarm set  GO     Cass Vandermeulen, Mardella Layman 07/14/2012, 1:09 PM

## 2012-07-14 NOTE — Evaluation (Signed)
Speech Language Pathology Evaluation Patient Details Name: Maurice Wood MRN: [id number] DOB: [birth year] Today's Date: 07/14/2012 Time:  -     Problem List:  Patient Active Problem List  Diagnosis  . DM type 2 (diabetes mellitus, type 2)  . TESTOSTERONE DEFICIENCY  . HYPERLIPIDEMIA  . ANEMIA  . ERECTILE DYSFUNCTION  . OBSTRUCTIVE SLEEP APNEA  . CORONARY ARTERY DISEASE  . Atrial fibrillation  . DISSECTION, CAROTID ARTERY  . HEMORRHOIDS-INTERNAL  . GERD  . DIVERTICULOSIS OF COLON  . IRRITABLE BOWEL SYNDROME  . BENIGN PROSTATIC HYPERTROPHY  . SLEEP APNEA  . PSA, INCREASED  . COLONIC POLYPS, HX OF  . Long term current use of anticoagulant  . Urinary incontinence  . Sternal pain  . Frequent falls  . Confusion  . Gait instability   Past Medical History:  Past Medical History  Diagnosis Date  . Atrial fibrillation Jan 2007    echo 1-07 normal ejection fraction, no significant valvular disease. adenosine cardiolite 1-07  no evidence of ischemia. A 48 hour Holter monitor in 7-07 showed good rate controlw/ chronic atrial fibrillation. Cath 1/11 normal cors. EF 45%. Echo 2-11 60-65%  . Obstructive sleep apnea     noncompliant with CPAP  . Cerebral aneurysm     followed by Dr Corliss Skains  . History of chest pain     Cardia catheterization 1999 was normal. myoview 2007 normal. cardiac cath jan 2011 normal cors EF 45%  . Obesity   . Hypertension   . Fatigue   . Allergy     rhinitis  . Hx of colonic polyps     diverticulosis  . Hyperlipidemia   . Retinal vein occlusion   . Benign prostatic hypertrophy   . IBS (irritable bowel syndrome)   . Diabetes mellitus     Elevated glucose/pre-dm  . Incontinence     Per pt 12/14/11   Past Surgical History:  Past Surgical History  Procedure Date  . Cholecystectomy   . Inguinal and umbilical herniorrhaphy   . Tonsillectomy and adenoidectomy as a child  . Cardiac catheterization 1999   HPI:      Assessment / Plan /  Recommendation Clinical Impression  Patient appears to be at baseline regarding speech, language, and cognition at this time, conversing normally with this therapist regarding current events and utilizing humor/jokes appropriately.      SLP Assessment  Patient does not need any further Speech Lanaguage Pathology Services    Follow Up Recommendations  None    Frequency and Duration        Pertinent Vitals/Pain n/a   SLP Goals     SLP Evaluation Prior Functioning  Cognitive/Linguistic Baseline: Within functional limits   Cognition  Overall Cognitive Status: Appears within functional limits for tasks assessed Arousal/Alertness: Awake/alert Orientation Level: Oriented X4 Attention: Divided Divided Attention: Appears intact Memory: Appears intact Awareness: Appears intact Problem Solving: Appears intact Safety/Judgment: Appears intact Comments: Patient was able to converse normally with SLP and family, including knowledge of current events and use of humor.    Comprehension  Auditory Comprehension Overall Auditory Comprehension: Appears within functional limits for tasks assessed    Expression Expression Primary Mode of Expression: Verbal Verbal Expression Overall Verbal Expression: Appears within functional limits for tasks assessed Initiation: No impairment   Oral / Motor     GO     Maryjo Rochester T 07/14/2012, 10:16 AM

## 2012-07-14 NOTE — Progress Notes (Signed)
ANTICOAGULATION CONSULT NOTE - Follow Up Consult  Pharmacy Consult for Coumadin Indication: Afib  Allergies  Allergen Reactions  . Clarithromycin     REACTION: Headache  . Epinephrine     REACTION: Increased Heart Rate  . Nimodipine     REACTION: Flushing    Patient Measurements: Height: 5\' 10"  (177.8 cm) Weight: 195 lb 1.7 oz (88.5 kg) IBW/kg (Calculated) : 73  Heparin Dosing Weight:    Vital Signs: Temp: 98.1 F (36.7 C) (08/05 0800) Temp src: Oral (08/05 0800) BP: 112/76 mmHg (08/05 0800) Pulse Rate: 83  (08/05 0800)  Labs:  Basename 07/14/12 0331 07/13/12 2041 07/13/12 1637 07/13/12 1615 07/11/12 1105  HGB -- -- 14.3 13.5 --  HCT -- -- 42.0 40.2 41.9  PLT -- -- -- 142* 140*  APTT -- -- -- 41* --  LABPROT 29.8* -- -- 30.1* 27.5*  INR 2.78* -- -- 2.82* 2.51*  HEPARINUNFRC -- -- -- -- --  CREATININE 0.89 -- 1.10 0.97 --  CKTOTAL 49 57 -- -- --  CKMB 1.7 2.1 -- -- --  TROPONINI <0.30 <0.30 -- -- --    Estimated Creatinine Clearance: 79.1 ml/min (by C-G formula based on Cr of 0.89).   Assessment: 78 y.o. M who presented to the [facility] on 8/4  with confusion an difficulty with gait. Ruled-out CVA. Patient on chronic Coumadin with INR therapeutic on admission (INR 2.82, goal of 2-3). PTA his home dose was warfarin was 5 mg daily.  Anticoag: Afib.INR 2.78. Continue home Coumadin 5mg /d. ASA d/c'd  ID: Tmax 101.7. WBC 12.6. Abx empiric for PNA . (Vanc kinetics: ke 0.054, Vd~64, Cmax(12)~31.7, MD~980 mg/12h). Cultures pending.  8/4: Zosyn>> 8/4: Vanco>>  Cards: Afib, HTN, HLD,  Neuro: Cerbral aneurysm followed as outpatient. 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 Scr 0.89 stable.  GI/Nutrition: Obesity, h/o colon polyps and diverticulosis, IBS  Endo: DM  Resp: OSA,. rhinitis  Goal of Therapy:  INR 2-3 Monitor platelets by anticoagulation protocol: Yes   Plan:  Resume home  Coumadin 5mg /day.  Merilynn Finland, [employer] 07/14/2012,8:54 AM

## 2012-07-14 NOTE — Progress Notes (Signed)
[facility] PROGRESS NOTE    SUBJECTIVE   Patient is alert and oriented, feeling much better.  Continues to have a cough.   OBJECTIVE   Vital signs in last 24 hours: Temp:  [97.6 F (36.4 C)-101.7 F (38.7 C)] 98.1 F (36.7 C) (08/05 0800) Pulse Rate:  [70-111] 83  (08/05 0800) Resp:  [16-20] 18  (08/05 0800) BP: (97-133)/(62-76) 112/76 mmHg (08/05 0800) SpO2:  [92 %-97 %] 94 % (08/05 0800) Weight:  [88 kg (194 lb 0.1 oz)-88.5 kg (195 lb 1.7 oz)] 88.5 kg (195 lb 1.7 oz) (08/04 2000)  Intake/Output from previous day: 08/04 0701 - 08/05 0700 In: 120 [P.O.:120] Out: -  Intake/Output this shift: Total I/O In: 240 [P.O.:240] Out: -  Nutritional status: Cardiac  Past Medical History  Diagnosis Date  . Atrial fibrillation Jan 2007    echo 1-07 normal ejection fraction, no significant valvular disease. adenosine cardiolite 1-07  no evidence of ischemia. A 48 hour Holter monitor in 7-07 showed good rate controlw/ chronic atrial fibrillation. Cath 1/11 normal cors. EF 45%. Echo 2-11 60-65%  . Obstructive sleep apnea     noncompliant with CPAP  . Cerebral aneurysm     followed by Dr Corliss Skains  . History of chest pain     Cardia catheterization 1999 was normal. myoview 2007 normal. cardiac cath jan 2011 normal cors EF 45%  . Obesity   . Hypertension   . Fatigue   . Allergy     rhinitis  . Hx of colonic polyps     diverticulosis  . Hyperlipidemia   . Retinal vein occlusion   . Benign prostatic hypertrophy   . IBS (irritable bowel syndrome)   . Diabetes mellitus     Elevated glucose/pre-dm  . Incontinence     Per pt 12/14/11    Neurologic Exam:  Mental Status: Alert, oriented X 3.  Speech fluent without evidence of aphasia. Able to follow 3 step commands without difficulty. Mood:up beat Thought content appropriate Cranial Nerves: II-Visual fields grossly intact. III/IV/VI-Extraocular movements intact.  Pupils reactive  bilaterally. Ptosis not present. V/VII-Smile symmetric VIII-grossly intact IX/X-normal gag XI-bilateral shoulder shrug XII-midline tongue extension Motor: 4/5 right shoulder due to pain, 4/4 right LE, 5/5 left UE, 5/5 left LE with normal tone and bulk Sensory: Pinprick and light touch intact throughout, bilaterally Deep Tendon Reflexes: 1+ and symmetric throughout UE depressed bilateral LE and no AK.     Plantars:      Right:  mute     Left:  Downgoing Cerebellar: Normal finger-to-nose,  normal heel-to-shin test.      Lab Results: Lab Results  Component Value Date/Time   CHOL 127 07/14/2012  3:31 AM   Lipid Panel  Basename 07/14/12 0331  CHOL 127  TRIG 89  HDL 56  CHOLHDL 2.3  VLDL 18  LDLCALC 53    Studies/Results: Ct Head Wo Contrast  07/13/2012  *RADIOLOGY REPORT*  Clinical Data: Code stroke.  Unsteady gait.  Confusion  CT HEAD WITHOUT CONTRAST  Technique:  Contiguous axial images were obtained from the base of the skull through the vertex without contrast.  Comparison: 04/12/2012  Findings: Old infarct  is identified within the left cerebellar hemisphere.  This is unchanged from previous exam.  There is diffuse  patchy low density throughout the subcortical and periventricular white matter consistent with chronic small vessel ischemic change.  There is no evidence for acute brain infarct, hemorrhage or mass.  The paranasal sinuses and mastoid air cells are clear.  IMPRESSION:  1.  Small vessel ischemic disease and brain infarct. 2.  No old left cerebellar hemisphere stroke.  Original Report Authenticated By: Rosealee Albee, M.D.   Mr Brain Wo Contrast  07/14/2012  *RADIOLOGY REPORT*  Clinical Data:  Unsteady gait.  Fall.  Cerebral aneurysm.  Stroke.  MRI HEAD WITHOUT CONTRAST MRA HEAD WITHOUT CONTRAST  Technique:  Multiplanar, multiecho pulse sequences of the brain and surrounding structures were obtained without intravenous contrast. Angiographic images of the head were obtained  using MRA technique without contrast.  Comparison:  CT head without contrast 07/13/2012.  MRI brain and MRA head 04/12/2012.  MRI HEAD  Findings:  The diffusion weighted images demonstrate no evidence for acute or subacute infarction.  No hemorrhage or mass lesion is present.  Moderate generalized atrophy is present.  A remote infarct of the left cerebellum is stable.  Periventricular and subcortical white matter changes are stable.  Flow is present in the major intracranial arteries.  The right MCA bifurcation aneurysm is again noted.  The patient is status post bilateral lens extractions.  The mucosal thickening is present throughout the paranasal sinuses.  The mastoid air cells are clear. The  IMPRESSION:  1.  No acute intracranial abnormality or significant interval change. 2.  Stable left cerebellar infarct. 3.  Stable atrophy and white matter disease.  This likely reflects the sequelae of chronic microvascular ischemia. 4.  Right MCA bifurcation aneurysm.  MRA HEAD  Findings: A 2 mm left posterior communicating artery aneurysm is stable.  The internal carotid arteries are otherwise within normal limits from the high cervical segments through the ICA termini. The A1 and M1 segments are normal.  Anterior communicating artery is patent.  A 5 mm right MCA bifurcation aneurysm is stable.  The left MCA bifurcation is within normal limits.  Minimal segmental irregularity of the MCA branch vessels is stable.  The right vertebral artery is slightly dominant to the left.  The PICA origins are visualized and within normal limits.  Both posterior cerebral arteries originate from basilar tip.  There is mild attenuation of distal MCA branch.  IMPRESSION:  1.  Stable right MCA bifurcation aneurysm. 2.  Stable 2 mm left posterior communicating artery aneurysm. 3.  Stable mild small vessel disease.  Original Report Authenticated By: Jamesetta Orleans. MATTERN, M.D.   Dg Chest Port 1 View  07/13/2012  *RADIOLOGY REPORT*  Clinical  Data: Stroke  PORTABLE CHEST - 1 VIEW  Comparison: 07/11/2012  Findings: Moderate cardiac enlargement and pulmonary venous congestion.  No effusion or edema.  No airspace consolidation. Review of the visualized osseous structures is unremarkable.  IMPRESSION:  1.  Cardiac enlargement and pulmonary venous congestion.  Original Report Authenticated By: Rosealee Albee, M.D.   Mr Mra Head/brain Wo Cm  07/14/2012  *RADIOLOGY REPORT*  Clinical Data:  Unsteady gait.  Fall.  Cerebral aneurysm.  Stroke.  MRI HEAD WITHOUT CONTRAST MRA HEAD WITHOUT CONTRAST  Technique:  Multiplanar, multiecho pulse sequences of the brain and surrounding structures were obtained without intravenous contrast. Angiographic images of the head were obtained using MRA technique without contrast.  Comparison:  CT head without contrast 07/13/2012.  MRI brain and MRA head 04/12/2012.  MRI HEAD  Findings:  The diffusion weighted images demonstrate  no evidence for acute or subacute infarction.  No hemorrhage or mass lesion is present.  Moderate generalized atrophy is present.  A remote infarct of the left cerebellum is stable.  Periventricular and subcortical white matter changes are stable.  Flow is present in the major intracranial arteries.  The right MCA bifurcation aneurysm is again noted.  The patient is status post bilateral lens extractions.  The mucosal thickening is present throughout the paranasal sinuses.  The mastoid air cells are clear. The  IMPRESSION:  1.  No acute intracranial abnormality or significant interval change. 2.  Stable left cerebellar infarct. 3.  Stable atrophy and white matter disease.  This likely reflects the sequelae of chronic microvascular ischemia. 4.  Right MCA bifurcation aneurysm.  MRA HEAD  Findings: A 2 mm left posterior communicating artery aneurysm is stable.  The internal carotid arteries are otherwise within normal limits from the high cervical segments through the ICA termini. The A1 and M1 segments are  normal.  Anterior communicating artery is patent.  A 5 mm right MCA bifurcation aneurysm is stable.  The left MCA bifurcation is within normal limits.  Minimal segmental irregularity of the MCA branch vessels is stable.  The right vertebral artery is slightly dominant to the left.  The PICA origins are visualized and within normal limits.  Both posterior cerebral arteries originate from basilar tip.  There is mild attenuation of distal MCA branch.  IMPRESSION:  1.  Stable right MCA bifurcation aneurysm. 2.  Stable 2 mm left posterior communicating artery aneurysm. 3.  Stable mild small vessel disease.  Original Report Authenticated By: Jamesetta Orleans. MATTERN, M.D.    Medications:     Scheduled:   . sodium chloride   Intravenous Once  . fluticasone  2 spray Each Nare Daily  . mirabegron ER  50 mg Oral Daily  . piperacillin-tazobactam (ZOSYN)  IV  3.375 g Intravenous Once  . piperacillin-tazobactam (ZOSYN)  IV  3.375 g Intravenous Q8H  . sodium chloride  3 mL Intravenous Q12H  . vancomycin  1,000 mg Intravenous Q12H  . warfarin  5 mg Oral Once  . warfarin  5 mg Oral q1800  . Warfarin - Pharmacist Dosing Inpatient   Does not apply q1800  . DISCONTD: aspirin  300 mg Rectal Daily  . DISCONTD: aspirin  325 mg Oral Daily  . DISCONTD: enoxaparin (LOVENOX) injection  40 mg Subcutaneous Q24H  . DISCONTD: vancomycin  1,000 mg Intravenous Once    Assessment/Plan:  78 yo male presenting with acute onset confusion and difficulty with gait. Patient has been having some recent problems with gait veering to the left and actually has had a recent fall but the mental status issues are quite new for him. WBC count is elevated and the patient is febrile as well. Mild localizing symptoms on exam. Patient with old cerebellar infarct on imaging but no acute changes seen. Patients  5 mm right MCA aneurysm remains stable and followed by Dr Corliss Skains.  Likely has infection which may be causing the current  presentation.  MRI head shows no scute infarct. Patient remains on Coumadin and both urine and respiratory cultures are pending.   Recommendation: 1) Continue coumadin 2) PT for gait 3) Neurology will S/O at this time.    Jaydrian Corpening PA-C [facility] [phone]  07/14/2012, 9:00 AM

## 2012-07-14 NOTE — Progress Notes (Signed)
VASCULAR LAB PRELIMINARY  PRELIMINARY  PRELIMINARY  PRELIMINARY  Carotid duplex  completed.    Preliminary report:  Bilateral:  No evidence of hemodynamically significant internal carotid artery stenosis.   Vertebral artery flow is antegrade.      Sharese Manrique, RVT 07/14/2012, 12:44 PM

## 2012-07-14 NOTE — Progress Notes (Signed)
Pt had 7 bts NSVT on tele while standing up to void.  Pt bp stable, pt c/o pain across chest that he relates to his fall prior to admission.  He was taking percocet for the chest pain at home per his wife at bedside.  650 mg PO tylenol given for pain per pt request and previous orders.  MD on call for [facility] text paged and awaiting return call.  Strip placed in chart, will continue to monitor.

## 2012-07-14 NOTE — Progress Notes (Signed)
[facility] PROGRESS NOTE  Maurice Wood UUV:[id number] DOB: 01/31/1933 DOA: 07/13/2012 PCP: Judie Petit, MD  Assessment/Plan: Principal Problem:  *Gait instability Active Problems:  Confusion  *Altered mental status, combination of narcotics, infection, most likely the sources in his lungs. Started on broad-spectrum antibiotics. The patient has improved significantly after receiving antibiotics. No gross focal neurologic deficits or meningismus to suggest meningitis. Continue to follow cultures, continue vancomycin and Zosyn   Pneumonia continue vancomycin Zosyn,suspect this is secondary to pneumonia  Atrial fibrillation next and continue Coumadin, rate controlled  Diabetes borderline diet-controlled check hemoglobin A1c  TIA/CVA? Doubt the patient has a stroke, complete stroke workup  PT OT eval in the morning  Hemoptysis, patient is on Coumadin, patient had an episode where he coughed up some blood, this was a very small amount. We'll monitor closely  Aneurysm in the right MCA as well as cerebellar aneurysm this is followed by Dr. Franky Macho Disposition Will monitor on telemetry given episode of hemoptysis this morning,     HPI/Subjective: Much better today, blood pressure still borderline low  Objective: Filed Vitals:   07/14/12 0400 07/14/12 0600 07/14/12 0800 07/14/12 0900  BP: 100/62 107/70 112/76 119/74  Pulse: 87 75 83 88  Temp: 99.6 F (37.6 C) 98.1 F (36.7 C) 98.1 F (36.7 C) 98.3 F (36.8 C)  TempSrc:   Oral Oral  Resp: 20 16 18 18   Height:      Weight:      SpO2: 92% 93% 94% 95%    Intake/Output Summary (Last 24 hours) at 07/14/12 1145 Last data filed at 07/14/12 0855  Gross per 24 hour  Intake    360 ml  Output      0 ml  Net    360 ml    Exam:  HENT:  Head: Atraumatic.  Nose: Nose normal.  Mouth/Throat: Oropharynx is clear and moist.  Eyes: Conjunctivae are normal. Pupils are equal, round, and reactive to light. No scleral icterus.    Neck: Neck supple. No tracheal deviation present.  Cardiovascular: Normal rate, regular rhythm, normal heart sounds and intact distal pulses.  Pulmonary/Chest: Effort normal and breath sounds normal. No respiratory distress.  Abdominal: Soft. Normal appearance and bowel sounds are normal. She exhibits no distension. There is no tenderness.  Musculoskeletal: She exhibits no edema and no tenderness.  Neurological: She is alert. No cranial nerve deficit.    Data Reviewed: Basic Metabolic Panel:  Lab 07/14/12 4742 07/13/12 2041 07/13/12 1637 07/13/12 1615 07/11/12 1105  NA 139 -- 139 135 138  K 4.2 -- 4.2 4.3 4.1  CL 109 -- 105 103 104  CO2 22 -- -- 22 25  GLUCOSE 132* -- 210* 219* 107*  BUN 13 -- 15 13 13   CREATININE 0.89 -- 1.10 0.97 0.93  CALCIUM 8.3* -- -- 8.6 9.5  MG -- 1.9 -- -- --  PHOS -- 2.2* -- -- --    Liver Function Tests:  Lab 07/14/12 0331 07/13/12 2041 07/13/12 1615  AST 16 23 25   ALT 11 12 16   ALKPHOS 56 61 67  BILITOT 1.0 1.1 0.8  PROT 5.8* 6.4 6.7  ALBUMIN 2.8* 3.1* 3.3*   No results found for this basename: LIPASE:5,AMYLASE:5 in the last 168 hours No results found for this basename: AMMONIA:5 in the last 168 hours  CBC:  Lab 07/13/12 1637 07/13/12 1615 07/11/12 1105  WBC -- 12.6* 6.2  NEUTROABS -- 11.5* --  HGB 14.3 13.5 14.1  HCT 42.0 40.2 41.9  MCV -- 89.7 88.2  PLT -- 142* 140*    Cardiac Enzymes:  Lab 07/14/12 0331 07/13/12 2041  CKTOTAL 49 57  CKMB 1.7 2.1  CKMBINDEX -- --  TROPONINI <0.30 <0.30   BNP (last 3 results)  Basename 07/13/12 2041 07/11/12 1057  PROBNP 868.5* 633.5*     CBG:  Lab 07/14/12 1136 07/14/12 0734 07/13/12 2019 07/13/12 1624  GLUCAP 125* 115* 110* 190*    No results found for this or any previous visit (from the past 240 hour(s)).   Studies: Dg Chest 2 View  07/11/2012  *RADIOLOGY REPORT*  Clinical Data: [name] 1 week ago with the anterior right chest pain and bruising  CHEST - 2 VIEW  Comparison: Chest  x-ray of 07/03/2012  Findings: The lungs remain clear but somewhat hyperaerated.  No focal infiltrate or effusion is seen.  Mild left basilar linear atelectasis or scarring is noted.  The heart is within upper limits normal.  The bones are osteopenic and there are degenerative changes in the thoracic spine.  No definite rib fracture is seen by chest x-ray.  IMPRESSION: Slight hyperaeration with left basilar linear atelectasis or scarring.  No definite fracture is seen by chest x-ray.  Original Report Authenticated By: Juline Patch, M.D.   Dg Chest 2 View  07/03/2012  *RADIOLOGY REPORT*  Clinical Data: Mid chest pain, shortness of breath, cough, fell across a chair this morning, former smoker  CHEST - 2 VIEW  Comparison: 12/15/2009  Findings: Enlargement of cardiac silhouette. Tortuous aorta. Pulmonary vascularity normal. Bronchitic and suspect emphysematous changes question COPD. Bibasilar atelectasis greater on left. Remaining lungs clear. No pleural effusion or pneumothorax. No acute osseous findings.  IMPRESSION: Enlargement of cardiac silhouette. Question COPD. Bibasilar atelectasis.  Original Report Authenticated By: Lollie Marrow, M.D.   Ct Head Wo Contrast  07/13/2012  *RADIOLOGY REPORT*  Clinical Data: Code stroke.  Unsteady gait.  Confusion  CT HEAD WITHOUT CONTRAST  Technique:  Contiguous axial images were obtained from the base of the skull through the vertex without contrast.  Comparison: 04/12/2012  Findings: Old infarct  is identified within the left cerebellar hemisphere.  This is unchanged from previous exam.  There is diffuse patchy low density throughout the subcortical and periventricular white matter consistent with chronic small vessel ischemic change.  There is no evidence for acute brain infarct, hemorrhage or mass.  The paranasal sinuses and mastoid air cells are clear.  IMPRESSION:  1.  Small vessel ischemic disease and brain infarct. 2.  No old left cerebellar hemisphere stroke.   Original Report Authenticated By: Rosealee Albee, M.D.   Mr Brain Wo Contrast  07/14/2012  *RADIOLOGY REPORT*  Clinical Data:  Unsteady gait.  Fall.  Cerebral aneurysm.  Stroke.  MRI HEAD WITHOUT CONTRAST MRA HEAD WITHOUT CONTRAST  Technique:  Multiplanar, multiecho pulse sequences of the brain and surrounding structures were obtained without intravenous contrast. Angiographic images of the head were obtained using MRA technique without contrast.  Comparison:  CT head without contrast 07/13/2012.  MRI brain and MRA head 04/12/2012.  MRI HEAD  Findings:  The diffusion weighted images demonstrate no evidence for acute or subacute infarction.  No hemorrhage or mass lesion is present.  Moderate generalized atrophy is present.  A remote infarct of the left cerebellum is stable.  Periventricular and subcortical white matter changes are stable.  Flow is present in the major intracranial arteries.  The right MCA bifurcation aneurysm is again noted.  The patient is status  post bilateral lens extractions.  The mucosal thickening is present throughout the paranasal sinuses.  The mastoid air cells are clear. The  IMPRESSION:  1.  No acute intracranial abnormality or significant interval change. 2.  Stable left cerebellar infarct. 3.  Stable atrophy and white matter disease.  This likely reflects the sequelae of chronic microvascular ischemia. 4.  Right MCA bifurcation aneurysm.  MRA HEAD  Findings: A 2 mm left posterior communicating artery aneurysm is stable.  The internal carotid arteries are otherwise within normal limits from the high cervical segments through the ICA termini. The A1 and M1 segments are normal.  Anterior communicating artery is patent.  A 5 mm right MCA bifurcation aneurysm is stable.  The left MCA bifurcation is within normal limits.  Minimal segmental irregularity of the MCA branch vessels is stable.  The right vertebral artery is slightly dominant to the left.  The PICA origins are visualized and  within normal limits.  Both posterior cerebral arteries originate from basilar tip.  There is mild attenuation of distal MCA branch.  IMPRESSION:  1.  Stable right MCA bifurcation aneurysm. 2.  Stable 2 mm left posterior communicating artery aneurysm. 3.  Stable mild small vessel disease.  Original Report Authenticated By: Jamesetta Orleans. MATTERN, M.D.   Dg Chest Port 1 View  07/13/2012  *RADIOLOGY REPORT*  Clinical Data: Stroke  PORTABLE CHEST - 1 VIEW  Comparison: 07/11/2012  Findings: Moderate cardiac enlargement and pulmonary venous congestion.  No effusion or edema.  No airspace consolidation. Review of the visualized osseous structures is unremarkable.  IMPRESSION:  1.  Cardiac enlargement and pulmonary venous congestion.  Original Report Authenticated By: Rosealee Albee, M.D.   Mr Mra Head/brain Wo Cm  07/14/2012  *RADIOLOGY REPORT*  Clinical Data:  Unsteady gait.  Fall.  Cerebral aneurysm.  Stroke.  MRI HEAD WITHOUT CONTRAST MRA HEAD WITHOUT CONTRAST  Technique:  Multiplanar, multiecho pulse sequences of the brain and surrounding structures were obtained without intravenous contrast. Angiographic images of the head were obtained using MRA technique without contrast.  Comparison:  CT head without contrast 07/13/2012.  MRI brain and MRA head 04/12/2012.  MRI HEAD  Findings:  The diffusion weighted images demonstrate no evidence for acute or subacute infarction.  No hemorrhage or mass lesion is present.  Moderate generalized atrophy is present.  A remote infarct of the left cerebellum is stable.  Periventricular and subcortical white matter changes are stable.  Flow is present in the major intracranial arteries.  The right MCA bifurcation aneurysm is again noted.  The patient is status post bilateral lens extractions.  The mucosal thickening is present throughout the paranasal sinuses.  The mastoid air cells are clear. The  IMPRESSION:  1.  No acute intracranial abnormality or significant interval change.  2.  Stable left cerebellar infarct. 3.  Stable atrophy and white matter disease.  This likely reflects the sequelae of chronic microvascular ischemia. 4.  Right MCA bifurcation aneurysm.  MRA HEAD  Findings: A 2 mm left posterior communicating artery aneurysm is stable.  The internal carotid arteries are otherwise within normal limits from the high cervical segments through the ICA termini. The A1 and M1 segments are normal.  Anterior communicating artery is patent.  A 5 mm right MCA bifurcation aneurysm is stable.  The left MCA bifurcation is within normal limits.  Minimal segmental irregularity of the MCA branch vessels is stable.  The right vertebral artery is slightly dominant to the left.  The PICA origins  are visualized and within normal limits.  Both posterior cerebral arteries originate from basilar tip.  There is mild attenuation of distal MCA branch.  IMPRESSION:  1.  Stable right MCA bifurcation aneurysm. 2.  Stable 2 mm left posterior communicating artery aneurysm. 3.  Stable mild small vessel disease.  Original Report Authenticated By: Jamesetta Orleans. MATTERN, M.D.    Scheduled Meds:   . sodium chloride   Intravenous Once  . fluticasone  2 spray Each Nare Daily  . mirabegron ER  50 mg Oral Daily  . piperacillin-tazobactam (ZOSYN)  IV  3.375 g Intravenous Once  . piperacillin-tazobactam (ZOSYN)  IV  3.375 g Intravenous Q8H  . sodium chloride  3 mL Intravenous Q12H  . vancomycin  1,000 mg Intravenous Q12H  . warfarin  5 mg Oral Once  . warfarin  5 mg Oral q1800  . Warfarin - Pharmacist Dosing Inpatient   Does not apply q1800  . DISCONTD: aspirin  300 mg Rectal Daily  . DISCONTD: aspirin  325 mg Oral Daily  . DISCONTD: enoxaparin (LOVENOX) injection  40 mg Subcutaneous Q24H  . DISCONTD: vancomycin  1,000 mg Intravenous Once   Continuous Infusions:   . sodium chloride 75 mL/hr at 07/14/12 0543    Principal Problem:  *Gait instability Active Problems:  Confusion    Time spent:  40 minutes   [facility]  [facility] Pager [phone]. If 8PM-8AM, please contact night-coverage at [email or web address], password [facility] 07/14/2012, 11:45 AM  LOS: 1 day

## 2012-07-14 NOTE — Evaluation (Signed)
Physical Therapy Evaluation Patient Details Name: Maurice Wood MRN: [id number] DOB: 1933/01/02 Today's Date: 07/14/2012 Time: 5621-3086 PT Time Calculation (min): 24 min  PT Assessment / Plan / Recommendation Clinical Impression  Pt is a 78 y/o male admitted s/p fall with confusion (CT and MRI -) along with the below PT problem list.  Pt would benefit from acute PT to maximize independence and facilitate d/c home with HHPT.    PT Assessment  Patient needs continued PT services    Follow Up Recommendations  Home health PT    Barriers to Discharge None      Equipment Recommendations  None recommended by PT    Recommendations for Other Services     Frequency Min 3X/week    Precautions / Restrictions Precautions Precautions: Fall Restrictions Weight Bearing Restrictions: No   Pertinent Vitals/Pain None      Mobility  Bed Mobility Bed Mobility: Supine to Sit;Sit to Supine Supine to Sit: 4: Min assist;HOB flat Sit to Supine: 4: Min assist;HOB flat Details for Bed Mobility Assistance: Assist to trunk to translate anterior and slow descent to surface with cues for safe sequence. Transfers Transfers: Sit to Stand;Stand to Sit (2 trials.) Sit to Stand: 4: Min assist;With upper extremity assist;From bed;From toilet Stand to Sit: 4: Min assist;With upper extremity assist;To toilet;To bed Details for Transfer Assistance: Assist for balance with cues for safest hand placement. Ambulation/Gait Ambulation/Gait Assistance: 4: Min assist Ambulation Distance (Feet): 70 Feet Assistive device: 1 person hand held assist (with occassional furniture walking.) Ambulation/Gait Assistance Details: Assist for balance with pt very guarded during gait.  Cues for safety. Gait Pattern: Decreased stride length;Decreased trunk rotation;Wide base of support Stairs: No Wheelchair Mobility Wheelchair Mobility: No    Exercises     PT Diagnosis: Difficulty walking  PT Problem List: Decreased  strength;Decreased activity tolerance;Decreased balance;Decreased mobility PT Treatment Interventions: Gait training;Stair training;Functional mobility training;Therapeutic activities;Balance training;Patient/family education   PT Goals Acute Rehab PT Goals PT Goal Formulation: With patient/family Time For Goal Achievement: 07/21/12 Potential to Achieve Goals: Good Pt will go Supine/Side to Sit: with modified independence PT Goal: Supine/Side to Sit - Progress: Goal set today Pt will go Sit to Supine/Side: with modified independence PT Goal: Sit to Supine/Side - Progress: Goal set today Pt will go Sit to Stand: with modified independence PT Goal: Sit to Stand - Progress: Goal set today Pt will go Stand to Sit: with modified independence PT Goal: Stand to Sit - Progress: Goal set today Pt will Ambulate: >150 feet;with modified independence;with least restrictive assistive device PT Goal: Ambulate - Progress: Goal set today Pt will Go Up / Down Stairs: 1-2 stairs;with modified independence;with least restrictive assistive device PT Goal: Up/Down Stairs - Progress: Goal set today  Visit Information  Last PT Received On: 07/14/12 Assistance Needed: +1 PT/OT Co-Evaluation/Treatment: Yes    Subjective Data  Subjective: "I am feeling much better." Patient Stated Goal: Go home.   Prior Functioning  Home Living Lives With: Spouse Available Help at Discharge: Family;Available PRN/intermittently (Available most of the time.) Type of Home: House Home Access: Stairs to enter [employer] of Steps: 2 Entrance Stairs-Rails: None Home Layout: One level Bathroom Shower/Tub: Walk-in shower;Door [employer] Toilet: Handicapped height Bathroom Accessibility: Yes How Accessible: Accessible via walker Home Adaptive Equipment: Straight cane;Shower chair without back;Grab bars in shower;Hand-held shower hose Prior Function Level of Independence: Independent Able to Take Stairs?:  Yes Driving: Yes Vocation: Retired Musician: No difficulties Dominant Hand: Right  Cognition  Overall Cognitive Status: Appears within functional limits for tasks assessed/performed Arousal/Alertness: Awake/alert Orientation Level: Appears intact for tasks assessed Behavior During Session: [facility] for tasks performed    Extremity/Trunk Assessment Right Upper Extremity Assessment RUE ROM/Strength/Tone:  (Defer to OT.) Left Upper Extremity Assessment LUE ROM/Strength/Tone:  (Defer to OT.) Right Lower Extremity Assessment RLE ROM/Strength/Tone: Within functional levels RLE Sensation: WFL - Light Touch RLE Coordination: WFL - gross/fine motor Left Lower Extremity Assessment LLE ROM/Strength/Tone: Within functional levels LLE Sensation: WFL - Light Touch LLE Coordination: WFL - gross/fine motor Trunk Assessment Trunk Assessment: Normal   Balance Balance Balance Assessed: No  End of Session PT - End of Session Equipment Utilized During Treatment: Gait belt Activity Tolerance: Patient tolerated treatment well Patient left: in bed;with call bell/phone within reach;with bed alarm set;with family/visitor present Nurse Communication: Mobility status  GP     Cephus Shelling 07/14/2012, 1:01 PM  07/14/2012 Cephus Shelling, PT, DPT [phone]

## 2012-07-14 NOTE — Progress Notes (Signed)
Dr. Susie Cassette notified of bloody sputum no new orders recieved

## 2012-07-14 NOTE — Progress Notes (Signed)
MD returned page with new orders to add magnesium level.  Will continue to monitor.

## 2012-07-14 NOTE — Progress Notes (Signed)
I agree with the following treatment note after reviewing documentation.   Johnston, Emauri Krygier Brynn   OTR/L Pager: [phone] Office: [phone] .   

## 2012-07-15 DIAGNOSIS — I059 Rheumatic mitral valve disease, unspecified: Secondary | ICD-10-CM

## 2012-07-15 LAB — BASIC METABOLIC PANEL
CO2: 24 mEq/L (ref 19–32)
Calcium: 8.9 mg/dL (ref 8.4–10.5)
Chloride: 105 mEq/L (ref 96–112)
Glucose, Bld: 177 mg/dL — ABNORMAL HIGH (ref 70–99)
Sodium: 138 mEq/L (ref 135–145)

## 2012-07-15 LAB — URINE CULTURE
Colony Count: NO GROWTH
Culture: NO GROWTH

## 2012-07-15 LAB — CBC
MCH: 29.9 pg (ref 26.0–34.0)
MCHC: 33.2 g/dL (ref 30.0–36.0)
MCV: 89.9 fL (ref 78.0–100.0)
Platelets: 136 10*3/uL — ABNORMAL LOW (ref 150–400)
RBC: 4.35 MIL/uL (ref 4.22–5.81)

## 2012-07-15 LAB — GLUCOSE, CAPILLARY: Glucose-Capillary: 87 mg/dL (ref 70–99)

## 2012-07-15 LAB — PROTIME-INR: Prothrombin Time: 28.6 seconds — ABNORMAL HIGH (ref 11.6–15.2)

## 2012-07-15 MED ORDER — OXYCODONE-ACETAMINOPHEN 5-325 MG PO TABS: 1.0000 | ORAL_TABLET | Freq: Three times a day (TID) | ORAL | Status: DC | PRN

## 2012-07-15 MED ORDER — ALBUTEROL SULFATE HFA 108 (90 BASE) MCG/ACT IN AERS: 2.0000 | INHALATION_SPRAY | Freq: Four times a day (QID) | RESPIRATORY_TRACT | Status: DC | PRN

## 2012-07-15 MED ORDER — MOXIFLOXACIN HCL 400 MG PO TABS: 400.0000 mg | ORAL_TABLET | Freq: Every day | ORAL | Status: DC

## 2012-07-15 NOTE — Progress Notes (Signed)
Physical Therapy Treatment Patient Details Name: Maurice Wood MRN: [id number] DOB: November 02, 1933 Today's Date: 07/15/2012 Time: 4098-1191 PT Time Calculation (min): 15 min  PT Assessment / Plan / Recommendation Comments on Treatment Session  Pt admitted with PNA and continues to progress with therapy.  Pt able to tolerate increased ambulation distance with increased independence.    Follow Up Recommendations  Home health PT    Barriers to Discharge        Equipment Recommendations  None recommended by PT    Recommendations for Other Services    Frequency Min 3X/week   Plan Discharge plan remains appropriate;Frequency remains appropriate    Precautions / Restrictions Precautions Precautions: Fall Restrictions Weight Bearing Restrictions: No   Pertinent Vitals/Pain None    Mobility  Bed Mobility Bed Mobility: Not assessed Transfers Transfers: Sit to Stand;Stand to Sit (2 trials.) Sit to Stand: 5: Supervision;With upper extremity assist;From bed Stand to Sit: 5: Supervision;With upper extremity assist;To bed Details for Transfer Assistance: Verbal cues for safest hand placement. Ambulation/Gait Ambulation/Gait Assistance: 4: Min guard Ambulation Distance (Feet): 250 Feet (200 feet and 50 feet.) Assistive device: None Ambulation/Gait Assistance Details: Guarding for balance especially during higher level balance activities such as horizontal/vertical head turns, obstacle avoidance, step over obstacle, and faster gait speeds. Gait Pattern: Decreased stride length Stairs: No Wheelchair Mobility Wheelchair Mobility: No    Exercises     PT Diagnosis:    PT Problem List:   PT Treatment Interventions:     PT Goals Acute Rehab PT Goals PT Goal Formulation: With patient/family Time For Goal Achievement: 07/21/12 Potential to Achieve Goals: Good PT Goal: Sit to Stand - Progress: Progressing toward goal PT Goal: Stand to Sit - Progress: Progressing toward goal PT Goal:  Ambulate - Progress: Progressing toward goal  Visit Information  Last PT Received On: 07/15/12 Assistance Needed: +1    Subjective Data  Subjective: "I can see a huge difference." Patient Stated Goal: Go home.   Cognition  Overall Cognitive Status: Appears within functional limits for tasks assessed/performed Arousal/Alertness: Awake/alert Orientation Level: Appears intact for tasks assessed Behavior During Session: [facility] for tasks performed    Balance  Balance Balance Assessed: No  End of Session PT - End of Session Equipment Utilized During Treatment: Gait belt Activity Tolerance: Patient tolerated treatment well Patient left: in bed;with call bell/phone within reach;with family/visitor present (Sitting EOB) Nurse Communication: Mobility status   GP     Cephus Shelling 07/15/2012, 11:46 AM  07/15/2012 Cephus Shelling, PT, DPT [phone]

## 2012-07-15 NOTE — Discharge Summary (Signed)
.      Physician Discharge Summary  Maurice Wood MRN: [id number] DOB/AGE: 08-10-1933 78 y.o.  PCP: Judie Petit, MD   Admit date: 07/13/2012 Discharge date: 07/15/2012  Discharge Diagnoses:     Gait instability Confusion  .  Atrial fibrillation  Jan 2007     echo 1-07 normal ejection fraction, no significant valvular disease. adenosine cardiolite 1-07 no evidence of ischemia. A 48 hour Holter monitor in 7-07 showed good rate controlw/ chronic atrial fibrillation. Cath 1/11 normal cors. EF 45%. Echo 2-11 60-65%   .  Obstructive sleep apnea      noncompliant with CPAP   .  Cerebral aneurysm      followed by Dr Corliss Skains   .  History of chest pain      Cardia catheterization 1999 was normal. myoview 2007 normal. cardiac cath jan 2011 normal cors EF 45%   .  Obesity    .  Hypertension    .  Fatigue    .  Allergy      rhinitis   .  Hx of colonic polyps      diverticulosis   .  Hyperlipidemia    .  Retinal vein occlusion    .  Benign prostatic hypertrophy    .  IBS (irritable bowel syndrome)    .  Diabetes mellitus      Elevated glucose/pre-dm   .  Incontinence      Per pt 12/14/11    Past Surgical History   Procedure  Date   .  Cholecystectomy    .  Inguinal and umbilical herniorrhaphy    .  Tonsillectomy and adenoidectomy  as a child   .  Cardiac catheterization  1999        Medication List  As of 07/15/2012  8:48 AM   STOP taking these medications         HYDROcodone-acetaminophen 5-500 MG per tablet         TAKE these medications         albuterol 108 (90 BASE) MCG/ACT inhaler   Commonly known as: PROVENTIL HFA;VENTOLIN HFA   Inhale 2 puffs into the lungs every 6 (six) hours as needed for wheezing.      Calcium + D 600-200 MG-UNIT Tabs   Generic drug: Calcium Carbonate-Vitamin D   Take 1 tablet by mouth 2 (two) times daily.      cetirizine 10 MG tablet   Commonly known as: ZYRTEC   Take 10 mg by mouth daily as needed. allergies      cholestyramine  4 GM/DOSE powder   Commonly known as: QUESTRAN   Take 4 g by mouth daily.      fluticasone 50 MCG/ACT nasal spray   Commonly known as: FLONASE   Place 2 sprays into the nose daily.      moxifloxacin 400 MG tablet   Commonly known as: AVELOX   Take 1 tablet (400 mg total) by mouth daily.      multivitamin tablet   Take 1 tablet by mouth daily.      MYRBETRIQ 50 MG Tb24   Generic drug: mirabegron ER   Take 50 mg by mouth daily.      oxyCODONE-acetaminophen 5-325 MG per tablet   Commonly known as: PERCOCET/ROXICET   Take 1 tablet by mouth every 8 (eight) hours as needed for pain.      warfarin 5 MG tablet   Commonly known as: COUMADIN   Take 5 mg  by mouth daily.            Discharge Condition: Stable Disposition: 01-Home or Self Care   Consults:   Significant Diagnostic Studies: Dg Chest 2 View  07/11/2012  *RADIOLOGY REPORT*  Clinical Data: [name] 1 week ago with the anterior right chest pain and bruising  CHEST - 2 VIEW  Comparison: Chest x-ray of 07/03/2012  Findings: The lungs remain clear but somewhat hyperaerated.  No focal infiltrate or effusion is seen.  Mild left basilar linear atelectasis or scarring is noted.  The heart is within upper limits normal.  The bones are osteopenic and there are degenerative changes in the thoracic spine.  No definite rib fracture is seen by chest x-ray.  IMPRESSION: Slight hyperaeration with left basilar linear atelectasis or scarring.  No definite fracture is seen by chest x-ray.  Original Report Authenticated By: Juline Patch, M.D.   Dg Chest 2 View  07/03/2012  *RADIOLOGY REPORT*  Clinical Data: Mid chest pain, shortness of breath, cough, fell across a chair this morning, former smoker  CHEST - 2 VIEW  Comparison: 12/15/2009  Findings: Enlargement of cardiac silhouette. Tortuous aorta. Pulmonary vascularity normal. Bronchitic and suspect emphysematous changes question COPD. Bibasilar atelectasis greater on left. Remaining lungs clear.  No pleural effusion or pneumothorax. No acute osseous findings.  IMPRESSION: Enlargement of cardiac silhouette. Question COPD. Bibasilar atelectasis.  Original Report Authenticated By: Lollie Marrow, M.D.   Ct Head Wo Contrast  07/13/2012  *RADIOLOGY REPORT*  Clinical Data: Code stroke.  Unsteady gait.  Confusion  CT HEAD WITHOUT CONTRAST  Technique:  Contiguous axial images were obtained from the base of the skull through the vertex without contrast.  Comparison: 04/12/2012  Findings: Old infarct  is identified within the left cerebellar hemisphere.  This is unchanged from previous exam.  There is diffuse patchy low density throughout the subcortical and periventricular white matter consistent with chronic small vessel ischemic change.  There is no evidence for acute brain infarct, hemorrhage or mass.  The paranasal sinuses and mastoid air cells are clear.  IMPRESSION:  1.  Small vessel ischemic disease and brain infarct. 2.  No old left cerebellar hemisphere stroke.  Original Report Authenticated By: Rosealee Albee, M.D.   Mr Brain Wo Contrast  07/14/2012  *RADIOLOGY REPORT*  Clinical Data:  Unsteady gait.  Fall.  Cerebral aneurysm.  Stroke.  MRI HEAD WITHOUT CONTRAST MRA HEAD WITHOUT CONTRAST  Technique:  Multiplanar, multiecho pulse sequences of the brain and surrounding structures were obtained without intravenous contrast. Angiographic images of the head were obtained using MRA technique without contrast.  Comparison:  CT head without contrast 07/13/2012.  MRI brain and MRA head 04/12/2012.  MRI HEAD  Findings:  The diffusion weighted images demonstrate no evidence for acute or subacute infarction.  No hemorrhage or mass lesion is present.  Moderate generalized atrophy is present.  A remote infarct of the left cerebellum is stable.  Periventricular and subcortical white matter changes are stable.  Flow is present in the major intracranial arteries.  The right MCA bifurcation aneurysm is again noted.  The  patient is status post bilateral lens extractions.  The mucosal thickening is present throughout the paranasal sinuses.  The mastoid air cells are clear. The  IMPRESSION:  1.  No acute intracranial abnormality or significant interval change. 2.  Stable left cerebellar infarct. 3.  Stable atrophy and white matter disease.  This likely reflects the sequelae of chronic microvascular ischemia. 4.  Right MCA  bifurcation aneurysm.  MRA HEAD  Findings: A 2 mm left posterior communicating artery aneurysm is stable.  The internal carotid arteries are otherwise within normal limits from the high cervical segments through the ICA termini. The A1 and M1 segments are normal.  Anterior communicating artery is patent.  A 5 mm right MCA bifurcation aneurysm is stable.  The left MCA bifurcation is within normal limits.  Minimal segmental irregularity of the MCA branch vessels is stable.  The right vertebral artery is slightly dominant to the left.  The PICA origins are visualized and within normal limits.  Both posterior cerebral arteries originate from basilar tip.  There is mild attenuation of distal MCA branch.  IMPRESSION:  1.  Stable right MCA bifurcation aneurysm. 2.  Stable 2 mm left posterior communicating artery aneurysm. 3.  Stable mild small vessel disease.  Original Report Authenticated By: Jamesetta Orleans. MATTERN, M.D.   Dg Chest Port 1 View  07/13/2012  *RADIOLOGY REPORT*  Clinical Data: Stroke  PORTABLE CHEST - 1 VIEW  Comparison: 07/11/2012  Findings: Moderate cardiac enlargement and pulmonary venous congestion.  No effusion or edema.  No airspace consolidation. Review of the visualized osseous structures is unremarkable.  IMPRESSION:  1.  Cardiac enlargement and pulmonary venous congestion.  Original Report Authenticated By: Rosealee Albee, M.D.   Mr Mra Head/brain Wo Cm  07/14/2012  *RADIOLOGY REPORT*  Clinical Data:  Unsteady gait.  Fall.  Cerebral aneurysm.  Stroke.  MRI HEAD WITHOUT CONTRAST MRA HEAD  WITHOUT CONTRAST  Technique:  Multiplanar, multiecho pulse sequences of the brain and surrounding structures were obtained without intravenous contrast. Angiographic images of the head were obtained using MRA technique without contrast.  Comparison:  CT head without contrast 07/13/2012.  MRI brain and MRA head 04/12/2012.  MRI HEAD  Findings:  The diffusion weighted images demonstrate no evidence for acute or subacute infarction.  No hemorrhage or mass lesion is present.  Moderate generalized atrophy is present.  A remote infarct of the left cerebellum is stable.  Periventricular and subcortical white matter changes are stable.  Flow is present in the major intracranial arteries.  The right MCA bifurcation aneurysm is again noted.  The patient is status post bilateral lens extractions.  The mucosal thickening is present throughout the paranasal sinuses.  The mastoid air cells are clear. The  IMPRESSION:  1.  No acute intracranial abnormality or significant interval change. 2.  Stable left cerebellar infarct. 3.  Stable atrophy and white matter disease.  This likely reflects the sequelae of chronic microvascular ischemia. 4.  Right MCA bifurcation aneurysm.  MRA HEAD  Findings: A 2 mm left posterior communicating artery aneurysm is stable.  The internal carotid arteries are otherwise within normal limits from the high cervical segments through the ICA termini. The A1 and M1 segments are normal.  Anterior communicating artery is patent.  A 5 mm right MCA bifurcation aneurysm is stable.  The left MCA bifurcation is within normal limits.  Minimal segmental irregularity of the MCA branch vessels is stable.  The right vertebral artery is slightly dominant to the left.  The PICA origins are visualized and within normal limits.  Both posterior cerebral arteries originate from basilar tip.  There is mild attenuation of distal MCA branch.  IMPRESSION:  1.  Stable right MCA bifurcation aneurysm. 2.  Stable 2 mm left posterior  communicating artery aneurysm. 3.  Stable mild small vessel disease.  Original Report Authenticated By: Jamesetta Orleans. MATTERN, M.D.  2-D echo pending    Microbiology: Recent Results (from the past 240 hour(s))  CULTURE, BLOOD (ROUTINE X 2)     Status: Normal (Preliminary result)   Collection Time   07/13/12  4:53 PM      Component Value Range Status Comment   Specimen Description BLOOD RIGHT ARM   Final    Special Requests BOTTLES DRAWN AEROBIC AND ANAEROBIC 10CC   Final    Culture  Setup Time 07/14/2012 02:51   Final    Culture     Final    Value:        BLOOD CULTURE RECEIVED NO GROWTH TO DATE CULTURE WILL BE HELD FOR 5 DAYS BEFORE ISSUING A FINAL NEGATIVE REPORT   Report Status PENDING   Incomplete   CULTURE, BLOOD (ROUTINE X 2)     Status: Normal (Preliminary result)   Collection Time   07/13/12  5:02 PM      Component Value Range Status Comment   Specimen Description BLOOD RIGHT HAND   Final    Special Requests BOTTLES DRAWN AEROBIC AND ANAEROBIC 10CC   Final    Culture  Setup Time 07/14/2012 02:51   Final    Culture     Final    Value:        BLOOD CULTURE RECEIVED NO GROWTH TO DATE CULTURE WILL BE HELD FOR 5 DAYS BEFORE ISSUING A FINAL NEGATIVE REPORT   Report Status PENDING   Incomplete      Labs: Results for orders placed during the hospital encounter of 07/13/12 (from the past 48 hour(s))  CBC WITH DIFFERENTIAL     Status: Abnormal   Collection Time   07/13/12  4:15 PM      Component Value Range Comment   WBC 12.6 (*) 4.0 - 10.5 K/uL    RBC 4.48  4.22 - 5.81 MIL/uL    Hemoglobin 13.5  13.0 - 17.0 g/dL    HCT 82.9  56.2 - 13.0 %    MCV 89.7  78.0 - 100.0 fL    MCH 30.1  26.0 - 34.0 pg    MCHC 33.6  30.0 - 36.0 g/dL    RDW 86.5  78.4 - 69.6 %    Platelets 142 (*) 150 - 400 K/uL    Neutrophils Relative 92 (*) 43 - 77 %    Neutro Abs 11.5 (*) 1.7 - 7.7 K/uL    Lymphocytes Relative 4 (*) 12 - 46 %    Lymphs Abs 0.5 (*) 0.7 - 4.0 K/uL    Monocytes Relative 4  3 - 12  %    Monocytes Absolute 0.5  0.1 - 1.0 K/uL    Eosinophils Relative 0  0 - 5 %    Eosinophils Absolute 0.0  0.0 - 0.7 K/uL    Basophils Relative 0  0 - 1 %    Basophils Absolute 0.0  0.0 - 0.1 K/uL   COMPREHENSIVE METABOLIC PANEL     Status: Abnormal   Collection Time   07/13/12  4:15 PM      Component Value Range Comment   Sodium 135  135 - 145 mEq/L    Potassium 4.3  3.5 - 5.1 mEq/L    Chloride 103  96 - 112 mEq/L    CO2 22  19 - 32 mEq/L    Glucose, Bld 219 (*) 70 - 99 mg/dL    BUN 13  6 - 23 mg/dL    Creatinine, Ser 2.95  0.50 -  1.35 mg/dL    Calcium 8.6  8.4 - 21.3 mg/dL    Total Protein 6.7  6.0 - 8.3 g/dL    Albumin 3.3 (*) 3.5 - 5.2 g/dL    AST 25  0 - 37 U/L    ALT 16  0 - 53 U/L    Alkaline Phosphatase 67  39 - 117 U/L    Total Bilirubin 0.8  0.3 - 1.2 mg/dL    GFR calc non Af Amer 78 (*) >90 mL/min    GFR calc Af Amer >90  >90 mL/min   PROTIME-INR     Status: Abnormal   Collection Time   07/13/12  4:15 PM      Component Value Range Comment   Prothrombin Time 30.1 (*) 11.6 - 15.2 seconds    INR 2.82 (*) 0.00 - 1.49   APTT     Status: Abnormal   Collection Time   07/13/12  4:15 PM      Component Value Range Comment   aPTT 41 (*) 24 - 37 seconds   GLUCOSE, CAPILLARY     Status: Abnormal   Collection Time   07/13/12  4:24 PM      Component Value Range Comment   Glucose-Capillary 190 (*) 70 - 99 mg/dL    Comment 1 Documented in Chart      Comment 2 Notify RN     POCT I-STAT TROPONIN I     Status: Normal   Collection Time   07/13/12  4:35 PM      Component Value Range Comment   Troponin i, poc 0.00  0.00 - 0.08 ng/mL    Comment 3            POCT I-STAT, CHEM 8     Status: Abnormal   Collection Time   07/13/12  4:37 PM      Component Value Range Comment   Sodium 139  135 - 145 mEq/L    Potassium 4.2  3.5 - 5.1 mEq/L    Chloride 105  96 - 112 mEq/L    BUN 15  6 - 23 mg/dL    Creatinine, Ser 0.86  0.50 - 1.35 mg/dL    Glucose, Bld 578 (*) 70 - 99 mg/dL    Calcium,  Ion 4.69  1.13 - 1.30 mmol/L    TCO2 21  0 - 100 mmol/L    Hemoglobin 14.3  13.0 - 17.0 g/dL    HCT 62.9  52.8 - 41.3 %   CULTURE, BLOOD (ROUTINE X 2)     Status: Normal (Preliminary result)   Collection Time   07/13/12  4:53 PM      Component Value Range Comment   Specimen Description BLOOD RIGHT ARM      Special Requests BOTTLES DRAWN AEROBIC AND ANAEROBIC 10CC      Culture  Setup Time 07/14/2012 02:51      Culture        Value:        BLOOD CULTURE RECEIVED NO GROWTH TO DATE CULTURE WILL BE HELD FOR 5 DAYS BEFORE ISSUING A FINAL NEGATIVE REPORT   Report Status PENDING     LACTIC ACID, PLASMA     Status: Abnormal   Collection Time   07/13/12  4:58 PM      Component Value Range Comment   Lactic Acid, Venous 2.7 (*) 0.5 - 2.2 mmol/L   CULTURE, BLOOD (ROUTINE X 2)     Status: Normal (Preliminary result)  Collection Time   07/13/12  5:02 PM      Component Value Range Comment   Specimen Description BLOOD RIGHT HAND      Special Requests BOTTLES DRAWN AEROBIC AND ANAEROBIC 10CC      Culture  Setup Time 07/14/2012 02:51      Culture        Value:        BLOOD CULTURE RECEIVED NO GROWTH TO DATE CULTURE WILL BE HELD FOR 5 DAYS BEFORE ISSUING A FINAL NEGATIVE REPORT   Report Status PENDING     URINALYSIS, ROUTINE W REFLEX MICROSCOPIC     Status: Abnormal   Collection Time   07/13/12  5:35 PM      Component Value Range Comment   Color, Urine YELLOW  YELLOW    APPearance CLEAR  CLEAR    Specific Gravity, Urine 1.025  1.005 - 1.030    pH 6.0  5.0 - 8.0    Glucose, UA >1000 (*) NEGATIVE mg/dL    Hgb urine dipstick NEGATIVE  NEGATIVE    Bilirubin Urine NEGATIVE  NEGATIVE    Ketones, ur 40 (*) NEGATIVE mg/dL    Protein, ur NEGATIVE  NEGATIVE mg/dL    Urobilinogen, UA 0.2  0.0 - 1.0 mg/dL    Nitrite NEGATIVE  NEGATIVE    Leukocytes, UA NEGATIVE  NEGATIVE   URINE MICROSCOPIC-ADD ON     Status: Normal   Collection Time   07/13/12  5:35 PM      Component Value Range Comment   Squamous  Epithelial / LPF RARE  RARE    WBC, UA 0-2  <3 WBC/hpf    RBC / HPF 0-2  <3 RBC/hpf   GLUCOSE, CAPILLARY     Status: Abnormal   Collection Time   07/13/12  8:19 PM      Component Value Range Comment   Glucose-Capillary 110 (*) 70 - 99 mg/dL   HEPATIC FUNCTION PANEL     Status: Abnormal   Collection Time   07/13/12  8:41 PM      Component Value Range Comment   Total Protein 6.4  6.0 - 8.3 g/dL    Albumin 3.1 (*) 3.5 - 5.2 g/dL    AST 23  0 - 37 U/L    ALT 12  0 - 53 U/L    Alkaline Phosphatase 61  39 - 117 U/L    Total Bilirubin 1.1  0.3 - 1.2 mg/dL    Bilirubin, Direct 0.2  0.0 - 0.3 mg/dL    Indirect Bilirubin 0.9  0.3 - 0.9 mg/dL   MAGNESIUM     Status: Normal   Collection Time   07/13/12  8:41 PM      Component Value Range Comment   Magnesium 1.9  1.5 - 2.5 mg/dL   PHOSPHORUS     Status: Abnormal   Collection Time   07/13/12  8:41 PM      Component Value Range Comment   Phosphorus 2.2 (*) 2.3 - 4.6 mg/dL   TSH     Status: Normal   Collection Time   07/13/12  8:41 PM      Component Value Range Comment   TSH 1.285  0.350 - 4.500 uIU/mL   CARDIAC PANEL(CRET KIN+CKTOT+MB+TROPI)     Status: Normal   Collection Time   07/13/12  8:41 PM      Component Value Range Comment   Total CK 57  7 - 232 U/L    CK, MB 2.1  0.3 - 4.0 ng/mL    Troponin I <0.30  <0.30 ng/mL    Relative Index RELATIVE INDEX IS INVALID  0.0 - 2.5   PRO B NATRIURETIC PEPTIDE     Status: Abnormal   Collection Time   07/13/12  8:41 PM      Component Value Range Comment   Pro B Natriuretic peptide (BNP) 868.5 (*) 0 - 450 pg/mL   HEMOGLOBIN A1C     Status: Abnormal   Collection Time   07/14/12  3:31 AM      Component Value Range Comment   Hemoglobin A1C 6.0 (*) <5.7 %    Mean Plasma Glucose 126 (*) <117 mg/dL   CARDIAC PANEL(CRET KIN+CKTOT+MB+TROPI)     Status: Normal   Collection Time   07/14/12  3:31 AM      Component Value Range Comment   Total CK 49  7 - 232 U/L    CK, MB 1.7  0.3 - 4.0 ng/mL    Troponin I <0.30   <0.30 ng/mL    Relative Index RELATIVE INDEX IS INVALID  0.0 - 2.5   COMPREHENSIVE METABOLIC PANEL     Status: Abnormal   Collection Time   07/14/12  3:31 AM      Component Value Range Comment   Sodium 139  135 - 145 mEq/L    Potassium 4.2  3.5 - 5.1 mEq/L    Chloride 109  96 - 112 mEq/L    CO2 22  19 - 32 mEq/L    Glucose, Bld 132 (*) 70 - 99 mg/dL    BUN 13  6 - 23 mg/dL    Creatinine, Ser 4.09  0.50 - 1.35 mg/dL    Calcium 8.3 (*) 8.4 - 10.5 mg/dL    Total Protein 5.8 (*) 6.0 - 8.3 g/dL    Albumin 2.8 (*) 3.5 - 5.2 g/dL    AST 16  0 - 37 U/L    ALT 11  0 - 53 U/L    Alkaline Phosphatase 56  39 - 117 U/L    Total Bilirubin 1.0  0.3 - 1.2 mg/dL    GFR calc non Af Amer 81 (*) >90 mL/min    GFR calc Af Amer >90  >90 mL/min   PROTIME-INR     Status: Abnormal   Collection Time   07/14/12  3:31 AM      Component Value Range Comment   Prothrombin Time 29.8 (*) 11.6 - 15.2 seconds    INR 2.78 (*) 0.00 - 1.49   LIPID PANEL     Status: Normal   Collection Time   07/14/12  3:31 AM      Component Value Range Comment   Cholesterol 127  0 - 200 mg/dL    Triglycerides 89  <811 mg/dL    HDL 56  >91 mg/dL    Total CHOL/HDL Ratio 2.3      VLDL 18  0 - 40 mg/dL    LDL Cholesterol 53  0 - 99 mg/dL   GLUCOSE, CAPILLARY     Status: Abnormal   Collection Time   07/14/12  7:34 AM      Component Value Range Comment   Glucose-Capillary 115 (*) 70 - 99 mg/dL    Comment 1 Notify RN     GLUCOSE, CAPILLARY     Status: Abnormal   Collection Time   07/14/12 11:36 AM      Component Value Range Comment   Glucose-Capillary 125 (*) 70 -  99 mg/dL    Comment 1 Notify RN     CARDIAC PANEL(CRET KIN+CKTOT+MB+TROPI)     Status: Normal   Collection Time   07/14/12  1:20 PM      Component Value Range Comment   Total CK 50  7 - 232 U/L    CK, MB 2.0  0.3 - 4.0 ng/mL    Troponin I <0.30  <0.30 ng/mL    Relative Index RELATIVE INDEX IS INVALID  0.0 - 2.5   GLUCOSE, CAPILLARY     Status: Abnormal   Collection Time    07/14/12  4:43 PM      Component Value Range Comment   Glucose-Capillary 112 (*) 70 - 99 mg/dL    Comment 1 Notify RN     GLUCOSE, CAPILLARY     Status: Abnormal   Collection Time   07/14/12  9:03 PM      Component Value Range Comment   Glucose-Capillary 150 (*) 70 - 99 mg/dL   MAGNESIUM     Status: Normal   Collection Time   07/14/12 11:10 PM      Component Value Range Comment   Magnesium 2.0  1.5 - 2.5 mg/dL   PROTIME-INR     Status: Abnormal   Collection Time   07/15/12  5:32 AM      Component Value Range Comment   Prothrombin Time 28.6 (*) 11.6 - 15.2 seconds    INR 2.64 (*) 0.00 - 1.49   CBC     Status: Abnormal   Collection Time   07/15/12  5:32 AM      Component Value Range Comment   WBC 6.5  4.0 - 10.5 K/uL    RBC 4.35  4.22 - 5.81 MIL/uL    Hemoglobin 13.0  13.0 - 17.0 g/dL    HCT 16.1  09.6 - 04.5 %    MCV 89.9  78.0 - 100.0 fL    MCH 29.9  26.0 - 34.0 pg    MCHC 33.2  30.0 - 36.0 g/dL    RDW 40.9  81.1 - 91.4 %    Platelets 136 (*) 150 - 400 K/uL   GLUCOSE, CAPILLARY     Status: Abnormal   Collection Time   07/15/12  7:32 AM      Component Value Range Comment   Glucose-Capillary 122 (*) 70 - 99 mg/dL    Comment 1 Notify RN        HPI :78 yo male presenting with acute onset confusion and difficulty with gait, confusion. Patient has been having some recent problems with gait for which he was undergoing outpatient physical therapy and actually has had a recent fall about a week ago, but the mental status issues are quite new for him. He was started on Percocet 2 tablets every 4 hours for his recent fall and chest wall bruising. The patient took Percocet every 4 hours yesterday and only once today. WBC count is elevated and the patient is febrile as well. He did not have any localizing symptoms on exam. Patient with old cerebellar infarct on imaging but no acute changes seen.  Patient is on Coumadin at home and presents with an INR of 2.51.   The day he presented he could not  get up to go to the bathroom and required some assistance but was able to ambulate. When they tried to get up to go home he was unable to get up from the chair. Required assistance to get to the  car. Was quite confused and was unable to remember dates, name of his children, etc. That he would usually be able to recount. When the family got home patient was no better. EMS was called and patient was brought in as a code stroke  He also presented with a low-grade fever of 101, and elevated white count.Suspected source of infection was thought to be in his lungs given his recent fall and chest wall bruising.   HOSPITAL COURSE: #1 fever thought to be secondary to atelectasis/pneumonia in the setting of recent chest trauma. The patient had shallow respirations due to chest wall pain, according to swallow week ago. He was started on broad-spectrum antibiotics namely vancomycin and Zosyn, blood cultures were drawn that have remained negative to date.. Patient was found to have left lower lobe crackles. He also had developed some cough and had one episode of coughing up some flecks of blood in the sputum, which was self-limiting. He is being switched to Avelox for another 7 days.  #2 altered mental status, suspected TIA/CVA workup was done and the MRI of the brain showed stable old cerebellar infarct as well as aneurysms. He is followed by neurosurgery and interventional radiology for this  #3 diabetes well controlled hemoglobin A1c was 6.0  #4 fibrillation on anticoagulation, the patient was therapeutic at the time of admission. His hemoptysis was self-limiting therefore Coumadin is being continued. His CBC has been stable  Discharge Exam:  Blood pressure 139/88, pulse 85, temperature 97.4 F (36.3 C), temperature source Oral, resp. rate 20, height 5\' 10"  (1.778 m), weight 88.5 kg (195 lb 1.7 oz), SpO2 93.00%.  Lying with eyes closed. No acute distress  HENT:  Head: Normocephalic and atraumatic.  Dry MM    Eyes: EOM are normal. Pupils are equal, round, and reactive to light.  Neck: Normal range of motion. Neck supple.  Cardiovascular: Regular rhythm.  irreg irreg tachy  Pulmonary/Chest: Effort normal and breath sounds normal. No respiratory distress. He has no wheezes. He has no rales. He exhibits tenderness (R chest wall bruise with TTP. Splinting on R-side).  Abdominal: Soft. Bowel sounds are normal. There is no tenderness. There is no rebound and no guarding.  Musculoskeletal: Normal range of motion. He exhibits no edema and no tenderness.  Neurological: He is oriented to person, place, and time.  Pt oriented to person and time though varies. 5/5 LUE motor. 4/5 RUE motor. 3/5 bl LE motor. Decreased sensation bl lower ext. Finger to nose mildly impaired.  Skin: Skin is warm and dry. No rash noted. No erythema.  Psychiatric: He has a normal mood and affect. His behavior is normal.       Discharge Orders    Future Appointments: Provider: Department: Dept Phone: Center:   07/16/2012 8:45 AM Lorrene Reid, PT [facility] [phone] [facility]   07/18/2012 8:00 AM Lindley Magnus, MD [facility] [phone] [facility]   07/18/2012 8:45 AM Hermenia Bers, PTA [facility] [phone] [facility]   07/23/2012 8:45 AM Caryl Bis, PT [facility] [phone] [facility]   07/25/2012 8:00 AM Nehemiah Massed Naumann-Houegnifio, PT [facility] [phone] [facility]   07/30/2012 8:45 AM Nehemiah Massed Naumann-Houegnifio, PT [facility] [phone] [facility]   08/01/2012 8:45 AM Nehemiah Massed Naumann-Houegnifio, PT [facility] [phone] [facility]   08/05/2012 10:20 AM Baker Pierini, FNP [facility] [phone] [facility]   08/29/2012 8:00 AM Lindley Magnus, MD [facility] [phone] [facility]      Follow-up Information    Follow up with CABBELL,KYLE L, MD in 1 week. (aneurysm)  Contact information:   [address] [place] [address] [phone]       Follow up with Judie Petit, MD in 1 week. (Posthospitalization followup)    Contact information:   [address] [facility] [place] [address] [phone]          Signed: Richarda Overlie 07/15/2012, 8:48 AM

## 2012-07-15 NOTE — Progress Notes (Signed)
D/c orders received;IV removed with gauze on, pt remains in stable condition, pt  meds and instructions reviewed and given to pt; reviewed with pt s/s of stroke/TIA; pt d/c to home

## 2012-07-15 NOTE — Progress Notes (Signed)
  Echocardiogram 2D Echocardiogram has been performed.  Javonne Dorko FRANCES 07/15/2012, 9:54 AM

## 2012-07-15 NOTE — Care Management Note (Unsigned)
    Page 1 of 1   07/15/2012     12:21:22 PM   CARE MANAGEMENT NOTE 07/15/2012  Patient:  Maurice Wood, Maurice Wood   Account Number:  [id number]  Date Initiated:  07/15/2012  Documentation initiated by:  GRAVES-BIGELOW,Corin Tilly  Subjective/Objective Assessment:   Pt admitted with PNA. Pt is from home with wife and needs [facility] services PT/OT. CM did speak  to family and they chose [facility] for services.     Action/Plan:   CM will make referral for services.  SOC to begin within 24-48 hours post d/c.   Anticipated DC Date:  07/15/2012   Anticipated DC Plan:  HOME W HOME HEALTH SERVICES      DC Planning Services  CM consult      [facility] Choice  HOME HEALTH   Choice offered to / List presented to:  C-1 Patient        HH arranged  HH-2 PT  HH-3 OT      [facility] agency  [facility]   Status of service:  Completed, signed off Medicare Important Message given?   (If response is "NO", the following Medicare IM given date fields will be blank) Date Medicare IM given:   Date Additional Medicare IM given:    Discharge Disposition:  HOME W HOME HEALTH SERVICES  Per UR Regulation:  Reviewed for med. necessity/level of care/duration of stay  If discussed at Long Length of Stay Meetings, dates discussed:    Comments:

## 2012-07-15 NOTE — Progress Notes (Signed)
Pt ambulated; pts o2 sats remains 94-96% on RA

## 2012-07-15 NOTE — Progress Notes (Signed)
ANTICOAGULATION CONSULT NOTE - Follow Up Consult  Pharmacy Consult for Coumadin Indication: Afib  Allergies  Allergen Reactions  . Clarithromycin     REACTION: Headache  . Epinephrine     REACTION: Increased Heart Rate  . Nimodipine     REACTION: Flushing    Patient Measurements: Height: 5\' 10"  (177.8 cm) Weight: 195 lb 1.7 oz (88.5 kg) IBW/kg (Calculated) : 73  Heparin Dosing Weight:    Vital Signs: Temp: 97.8 F (36.6 C) (08/06 0800) Temp src: Oral (08/06 0800) BP: 119/66 mmHg (08/06 0800) Pulse Rate: 84  (08/06 0800)  Labs:  Basename 07/15/12 0532 07/14/12 1320 07/14/12 0331 07/13/12 2041 07/13/12 1637 07/13/12 1615  HGB 13.0 -- -- -- 14.3 --  HCT 39.1 -- -- -- 42.0 40.2  PLT 136* -- -- -- -- 142*  APTT -- -- -- -- -- 41*  LABPROT 28.6* -- 29.8* -- -- 30.1*  INR 2.64* -- 2.78* -- -- 2.82*  HEPARINUNFRC -- -- -- -- -- --  CREATININE -- -- 0.89 -- 1.10 0.97  CKTOTAL -- 50 49 57 -- --  CKMB -- 2.0 1.7 2.1 -- --  TROPONINI -- <0.30 <0.30 <0.30 -- --    Estimated Creatinine Clearance: 79.1 ml/min (by C-G formula based on Cr of 0.89).   Assessment: 78 y.o. M who presented to the [facility] on 8/4  with confusion and difficulty with gait. Ruled-out CVA. Patient on chronic Coumadin with INR therapeutic on admission (INR 2.82, goal of 2-3). PTA his home dose was warfarin was 5 mg daily.  Anticoag: Afib.INR 2.64. Continue home Coumadin 5mg /d. ASA d/c'd. Coughed up small amount of blood 8/5 noted.  ID: Tmax Afebrile.. WBC 6.5 down. Abx empiric for PNA . BC x 2 pending. (Vanc kinetics: ke 0.054, Vd~64, Cmax(12)~31.7, MD~980 mg/12h). Vanco trough tomorrow am.  8/4 pm: Zosyn>> 8/4 pm: Vanco>>  Cards: Afib, HTN, HLD, VSS  Neuro: Cerbral aneurysm followed as outpatient. 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 Scr 0.89 stable.  GI/Nutrition: Obesity, h/o colon polyps and diverticulosis,  IBS  Endo: DM  Resp: OSA,. rhinitis  Goal of Therapy:  INR 2-3 Monitor platelets by anticoagulation protocol: Yes   Plan:  Home Coumadin 5mg /day. Vanco trough tomorrow am  Pasty Spillers 07/15/2012,9:16 AM

## 2012-07-18 ENCOUNTER — Ambulatory Visit (INDEPENDENT_AMBULATORY_CARE_PROVIDER_SITE_OTHER): Payer: Medicare Other | Admitting: Internal Medicine

## 2012-07-18 ENCOUNTER — Encounter: Payer: Self-pay | Admitting: Internal Medicine

## 2012-07-18 VITALS — BP 124/90 | HR 84 | Temp 98.3°F

## 2012-07-18 DIAGNOSIS — E119 Type 2 diabetes mellitus without complications: Secondary | ICD-10-CM

## 2012-07-18 DIAGNOSIS — I2510 Atherosclerotic heart disease of native coronary artery without angina pectoris: Secondary | ICD-10-CM

## 2012-07-18 NOTE — Progress Notes (Signed)
Patient ID: Maurice Wood, male   DOB: [birth year], 78 y.o.   MRN: [id number] HOSPITAL COURSE:  #1 fever thought to be secondary to atelectasis/pneumonia in the setting of recent chest trauma. The patient had shallow respirations due to chest wall pain, according to swallow week ago. He was started on broad-spectrum antibiotics namely vancomycin and Zosyn, blood cultures were drawn that have remained negative to date.. Patient was found to have left lower lobe crackles. He also had developed some cough and had one episode of coughing up some flecks of blood in the sputum, which was self-limiting. He is being switched to Avelox for another 7 days.  #2 altered mental status, suspected TIA/CVA workup was done and the MRI of the brain showed stable old cerebellar infarct as well as aneurysms. He is followed by neurosurgery and interventional radiology for this  #3 diabetes well controlled hemoglobin A1c was 6.0  #4 fibrillation on anticoagulation, the patient was therapeutic at the time of admission. His hemoptysis was self-limiting therefore Coumadin is being continued. His CBC has been stable  His main complaint is sternal pain-- see imaging studies and previous hospital notes.   Past Medical History  Diagnosis Date  . Atrial fibrillation Jan 2007    echo 1-07 normal ejection fraction, no significant valvular disease. adenosine cardiolite 1-07  no evidence of ischemia. A 48 hour Holter monitor in 7-07 showed good rate controlw/ chronic atrial fibrillation. Cath 1/11 normal cors. EF 45%. Echo 2-11 60-65%  . Obstructive sleep apnea     noncompliant with CPAP  . Cerebral aneurysm     followed by Dr Corliss Skains  . History of chest pain     Cardia catheterization 1999 was normal. myoview 2007 normal. cardiac cath jan 2011 normal cors EF 45%  . Obesity   . Hypertension   . Fatigue   . Allergy     rhinitis  . Hx of colonic polyps     diverticulosis  . Hyperlipidemia   . Retinal vein occlusion   . Benign  prostatic hypertrophy   . IBS (irritable bowel syndrome)   . Diabetes mellitus     Elevated glucose/pre-dm  . Incontinence     Per pt 12/14/11    History   Social History  . Marital Status: Married    Spouse Name: N/A    Number of Children: N/A  . Years of Education: N/A   Occupational History  . Not on file.   Social History Main Topics  . Smoking status: Former Games developer  . Smokeless tobacco: Not on file   Comment: quit 1973  . Alcohol Use: Yes  . Drug Use: Not on file  . Sexually Active: Not on file   Other Topics Concern  . Not on file   Social History Narrative  . No narrative on file    Past Surgical History  Procedure Date  . Cholecystectomy   . Inguinal and umbilical herniorrhaphy   . Tonsillectomy and adenoidectomy as a child  . Cardiac catheterization 1999    Family History  Problem Relation Age of Onset  . Cancer Father     lung  . Alcohol abuse Father   . Diabetes Son   . Cancer Maternal Grandmother     colon  . Diabetes Maternal Uncle   . Diabetes Paternal Uncle     Allergies  Allergen Reactions  . Clarithromycin     REACTION: Headache  . Epinephrine     REACTION: Increased Heart Rate  . Nimodipine  REACTION: Flushing    Current Outpatient Prescriptions on File Prior to Visit  Medication Sig Dispense Refill  . albuterol (PROVENTIL HFA;VENTOLIN HFA) 108 (90 BASE) MCG/ACT inhaler Inhale 2 puffs into the lungs every 6 (six) hours as needed for wheezing.  1 Inhaler  2  . cetirizine (ZYRTEC) 10 MG tablet Take 10 mg by mouth daily as needed. allergies      . Mirabegron ER (MYRBETRIQ) 50 MG TB24 Take 50 mg by mouth daily.       Marland Kitchen moxifloxacin (AVELOX) 400 MG tablet Take 1 tablet (400 mg total) by mouth daily.  7 tablet  0  . oxyCODONE-acetaminophen (PERCOCET) 5-325 MG per tablet Take 1 tablet by mouth every 8 (eight) hours as needed for pain.  2 tablet  0  . warfarin (COUMADIN) 5 MG tablet Take 5 mg by mouth daily.      . Calcium  Carbonate-Vitamin D (CALCIUM + D) 600-200 MG-UNIT TABS Take 1 tablet by mouth 2 (two) times daily.       . cholestyramine (QUESTRAN) 4 GM/DOSE powder Take 4 g by mouth daily.       . fluticasone (FLONASE) 50 MCG/ACT nasal spray Place 2 sprays into the nose daily.      . Multiple Vitamin (MULTIVITAMIN) tablet Take 1 tablet by mouth daily.           patient denies chest pain, shortness of breath, orthopnea. Denies lower extremity edema, abdominal pain, change in appetite, change in bowel movements. Patient denies rashes, musculoskeletal complaints. No other specific complaints in a complete review of systems.   BP 124/90  Pulse 84  Temp 98.3 F (36.8 C) (Oral)  well-developed well-nourished male in no acute distress. HEENT exam atraumatic, normocephalic, neck supple without jugular venous distention. Chest clear to auscultation cardiac exam S1-S2 are regular. Abdominal exam overweight with bowel sounds, soft and nontender. Extremities no edema. Neurologic exam is alert with a normal gait.

## 2012-07-18 NOTE — Assessment & Plan Note (Signed)
Weight loss is key Discussed need for regular exercise- this will cause some chest pain-- he should exercise through [facility] pain

## 2012-07-18 NOTE — Assessment & Plan Note (Signed)
No sxs Chest pain is from sternal trauma

## 2012-07-20 LAB — CULTURE, BLOOD (ROUTINE X 2)

## 2012-07-23 ENCOUNTER — Encounter: Payer: Medicare Other | Admitting: Physical Therapy

## 2012-07-25 ENCOUNTER — Encounter: Payer: Medicare Other | Admitting: Physical Therapy

## 2012-07-30 ENCOUNTER — Encounter: Payer: Medicare Other | Admitting: Physical Therapy

## 2012-08-01 ENCOUNTER — Encounter: Payer: Medicare Other | Admitting: Physical Therapy

## 2012-08-05 ENCOUNTER — Ambulatory Visit (INDEPENDENT_AMBULATORY_CARE_PROVIDER_SITE_OTHER): Payer: Medicare Other | Admitting: Family

## 2012-08-05 DIAGNOSIS — Z7901 Long term (current) use of anticoagulants: Secondary | ICD-10-CM

## 2012-08-05 LAB — POCT INR: INR: 3.6

## 2012-08-05 NOTE — Patient Instructions (Addendum)
Hold coumadin today. Eat more green veggies. Same dose, 5 mg everydays,check in 3 weeks     Latest dosing instructions   Total Sun Mon Tue Wed Thu Fri Sat   35 5 mg 5 mg 5 mg 5 mg 5 mg 5 mg 5 mg    (5 mg1) (5 mg1) (5 mg1) (5 mg1) (5 mg1) (5 mg1) (5 mg1)

## 2012-08-21 ENCOUNTER — Other Ambulatory Visit: Payer: Self-pay

## 2012-08-28 ENCOUNTER — Ambulatory Visit (INDEPENDENT_AMBULATORY_CARE_PROVIDER_SITE_OTHER): Payer: Medicare Other | Admitting: Family

## 2012-08-28 DIAGNOSIS — Z7901 Long term (current) use of anticoagulants: Secondary | ICD-10-CM

## 2012-08-28 NOTE — Patient Instructions (Signed)
Take 1/2 tab today. Same dose, 5 mg every days except 1/2 tab on Wednesday only    Latest dosing instructions   Total Sun Mon Tue Wed Thu Fri Sat   32.5 5 mg 5 mg 5 mg 2.5 mg 5 mg 5 mg 5 mg    (5 mg1) (5 mg1) (5 mg1) (5 mg0.5) (5 mg1) (5 mg1) (5 mg1)

## 2012-08-29 ENCOUNTER — Ambulatory Visit: Payer: Medicare Other | Admitting: Internal Medicine

## 2012-09-12 ENCOUNTER — Ambulatory Visit (INDEPENDENT_AMBULATORY_CARE_PROVIDER_SITE_OTHER): Payer: Medicare Other | Admitting: Internal Medicine

## 2012-09-12 ENCOUNTER — Encounter: Payer: Self-pay | Admitting: Internal Medicine

## 2012-09-12 VITALS — BP 102/74 | HR 77 | Temp 97.8°F | Wt 194.0 lb

## 2012-09-12 DIAGNOSIS — I4891 Unspecified atrial fibrillation: Secondary | ICD-10-CM

## 2012-09-12 DIAGNOSIS — Z9181 History of falling: Secondary | ICD-10-CM

## 2012-09-12 DIAGNOSIS — I2510 Atherosclerotic heart disease of native coronary artery without angina pectoris: Secondary | ICD-10-CM

## 2012-09-12 DIAGNOSIS — R296 Repeated falls: Secondary | ICD-10-CM | POA: Insufficient documentation

## 2012-09-12 NOTE — Assessment & Plan Note (Signed)
No sxs Continue meds 

## 2012-09-12 NOTE — Assessment & Plan Note (Signed)
Refer PT

## 2012-09-12 NOTE — Assessment & Plan Note (Signed)
Continue warfarin. He has fallen- refer to physical therapy

## 2012-09-12 NOTE — Progress Notes (Signed)
Patient ID: Maurice Wood, male   DOB: [birth year], 78 y.o.   MRN: [id number]  patient comes in for followup of multiple medical problems including type 2 diabetes, hyperlipidemia, hypertension. The patient does check blood sugar (90-115) but does not check blood pressure at home. The patetient does not follow an exercise or diet program. The patient denies any polyuria, polydipsia.  In the past the patient has gone to diabetic treatment center. The patient is tolerating medications  Without difficulty. The patient does admit to medication compliance.  He has not lost any weight  Has some left sided chest pain related to fall two months ago---lateral left wall  Balance--he has had several falls and has scheduled physical therapy  Reviewed pmh, psh   well-developed well-nourished male in no acute distress. HEENT exam atraumatic, normocephalic, neck supple without jugular venous distention. Chest clear to auscultation cardiac exam S1-S2 are regular. Abdominal exam overweight with bowel sounds, soft and nontender. Extremities no edema. Neurologic exam is alert with a normal gait.

## 2012-09-25 ENCOUNTER — Ambulatory Visit: Payer: Medicare Other | Attending: Internal Medicine | Admitting: Physical Therapy

## 2012-09-25 ENCOUNTER — Ambulatory Visit (INDEPENDENT_AMBULATORY_CARE_PROVIDER_SITE_OTHER): Payer: Medicare Other | Admitting: Family

## 2012-09-25 DIAGNOSIS — R262 Difficulty in walking, not elsewhere classified: Secondary | ICD-10-CM | POA: Insufficient documentation

## 2012-09-25 DIAGNOSIS — R269 Unspecified abnormalities of gait and mobility: Secondary | ICD-10-CM | POA: Insufficient documentation

## 2012-09-25 DIAGNOSIS — IMO0001 Reserved for inherently not codable concepts without codable children: Secondary | ICD-10-CM | POA: Insufficient documentation

## 2012-09-25 DIAGNOSIS — Z7901 Long term (current) use of anticoagulants: Secondary | ICD-10-CM

## 2012-09-25 NOTE — Patient Instructions (Addendum)
Same dose, 5 mg every days except 1/2 tab on Wednesday only. Recheck in 4 weeks.     Latest dosing instructions   Total Sun Mon Tue Wed Thu Fri Sat   32.5 5 mg 5 mg 5 mg 2.5 mg 5 mg 5 mg 5 mg    (5 mg1) (5 mg1) (5 mg1) (5 mg0.5) (5 mg1) (5 mg1) (5 mg1)

## 2012-09-30 ENCOUNTER — Ambulatory Visit: Payer: Medicare Other | Admitting: Physical Therapy

## 2012-10-03 ENCOUNTER — Ambulatory Visit: Payer: Medicare Other | Admitting: Physical Therapy

## 2012-10-07 ENCOUNTER — Ambulatory Visit: Payer: Medicare Other | Admitting: Physical Therapy

## 2012-10-09 ENCOUNTER — Ambulatory Visit: Payer: Medicare Other | Admitting: Physical Therapy

## 2012-10-14 ENCOUNTER — Ambulatory Visit: Payer: Medicare Other | Attending: Internal Medicine | Admitting: Physical Therapy

## 2012-10-14 DIAGNOSIS — R262 Difficulty in walking, not elsewhere classified: Secondary | ICD-10-CM | POA: Insufficient documentation

## 2012-10-14 DIAGNOSIS — IMO0001 Reserved for inherently not codable concepts without codable children: Secondary | ICD-10-CM | POA: Insufficient documentation

## 2012-10-14 DIAGNOSIS — R269 Unspecified abnormalities of gait and mobility: Secondary | ICD-10-CM | POA: Insufficient documentation

## 2012-10-17 ENCOUNTER — Ambulatory Visit: Payer: Medicare Other | Admitting: Physical Therapy

## 2012-10-20 ENCOUNTER — Ambulatory Visit: Payer: Medicare Other | Admitting: Physical Therapy

## 2012-10-21 ENCOUNTER — Ambulatory Visit (HOSPITAL_COMMUNITY)
Admission: RE | Admit: 2012-10-21 | Discharge: 2012-10-21 | Disposition: A | Discharge status: Home / Self Care | Payer: Medicare Other | Source: Ambulatory Visit | Attending: Internal Medicine | Admitting: Internal Medicine

## 2012-10-21 ENCOUNTER — Encounter (HOSPITAL_COMMUNITY): Payer: Self-pay

## 2012-10-21 VITALS — BP 112/90 | HR 87 | Ht 70.0 in | Wt 197.4 lb

## 2012-10-21 DIAGNOSIS — I4891 Unspecified atrial fibrillation: Secondary | ICD-10-CM | POA: Insufficient documentation

## 2012-10-21 DIAGNOSIS — Z7901 Long term (current) use of anticoagulants: Secondary | ICD-10-CM | POA: Insufficient documentation

## 2012-10-21 DIAGNOSIS — Z91199 Patient's noncompliance with other medical treatment and regimen due to unspecified reason: Secondary | ICD-10-CM | POA: Insufficient documentation

## 2012-10-21 DIAGNOSIS — E669 Obesity, unspecified: Secondary | ICD-10-CM | POA: Insufficient documentation

## 2012-10-21 DIAGNOSIS — E119 Type 2 diabetes mellitus without complications: Secondary | ICD-10-CM | POA: Insufficient documentation

## 2012-10-21 DIAGNOSIS — Z9119 Patient's noncompliance with other medical treatment and regimen: Secondary | ICD-10-CM | POA: Insufficient documentation

## 2012-10-21 DIAGNOSIS — G4733 Obstructive sleep apnea (adult) (pediatric): Secondary | ICD-10-CM | POA: Insufficient documentation

## 2012-10-21 DIAGNOSIS — R600 Localized edema: Secondary | ICD-10-CM

## 2012-10-21 DIAGNOSIS — I10 Essential (primary) hypertension: Secondary | ICD-10-CM | POA: Insufficient documentation

## 2012-10-21 DIAGNOSIS — E785 Hyperlipidemia, unspecified: Secondary | ICD-10-CM | POA: Insufficient documentation

## 2012-10-21 DIAGNOSIS — R609 Edema, unspecified: Secondary | ICD-10-CM

## 2012-10-21 DIAGNOSIS — K589 Irritable bowel syndrome without diarrhea: Secondary | ICD-10-CM | POA: Insufficient documentation

## 2012-10-21 DIAGNOSIS — Z79899 Other long term (current) drug therapy: Secondary | ICD-10-CM | POA: Insufficient documentation

## 2012-10-21 MED ORDER — FUROSEMIDE 20 MG PO TABS
20.0000 mg | ORAL_TABLET | Freq: Every day | ORAL | Status: DC | PRN
Start: 2012-10-21 — End: 2013-01-12

## 2012-10-21 MED ORDER — POTASSIUM CHLORIDE CRYS ER 20 MEQ PO TBCR: 20.0000 meq | EXTENDED_RELEASE_TABLET | Freq: Every day | ORAL | Status: DC | PRN

## 2012-10-21 MED ORDER — APIXABAN 2.5 MG PO TABS: 2.5000 mg | ORAL_TABLET | Freq: Two times a day (BID) | ORAL | Status: DC

## 2012-10-21 NOTE — Progress Notes (Signed)
Patient ID: Maurice Wood, male   DOB: [birth year], 78 y.o.   MRN: [id number] HPI:  Maurice Wood is a delightful 78 year old male with a history of chronic atrial fibrillation, obstructive sleep apnea, with noncompliance with CPAP. 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 artificially low due to AF). Echo  2/11: 60-65%  In the hospital in 8/13 for chest contusion after a fall and possible PNA.  Echo 8/13 - Left ventricle: The cavity size was normal. Wall thickness was increased in a pattern of mild LVH. Systolic function was vigorous. The estimated ejection fraction was in the range of 65% to 70%. Wall motion was normal; there were no regional wall motion abnormalities. - Aortic valve: Mild regurgitation. - Mitral valve: Mild to moderate regurgitation. - Left atrium: The atrium was mildly dilated. - Right atrium: The atrium was mildly dilated.  Returns for yearly f/u. Doing well.  No agina, SOB or palpitations. Occasional LE edema. No orthopnea or PND. Taking coumadin religiously. No bleeding. INR therapeutic. No longer going to [facility]. Just complete neuro rehab program for balance.     ROS: All systems negative except as listed in HPI, PMH and Problem List.  Past Medical History  Diagnosis Date  . Atrial fibrillation Jan 2007    echo 1-07 normal ejection fraction, no significant valvular disease. adenosine cardiolite 1-07  no evidence of ischemia. A 48 hour Holter monitor in 7-07 showed good rate controlw/ chronic atrial fibrillation. Cath 1/11 normal cors. EF 45%. Echo 2-11 60-65%  . Obstructive sleep apnea     noncompliant with CPAP  . Cerebral aneurysm     followed by Dr Corliss Skains  . History of chest pain     Cardia catheterization 1999 was normal. myoview 2007 normal. cardiac cath jan  2011 normal cors EF 45%  . Obesity   . Hypertension   . Fatigue   . Allergy     rhinitis  . Hx of colonic polyps     diverticulosis  . Hyperlipidemia   . Retinal vein occlusion   . Benign prostatic hypertrophy   . IBS (irritable bowel syndrome)   . Diabetes mellitus     Elevated glucose/pre-dm  . Incontinence     Per pt 12/14/11    Current Outpatient Prescriptions  Medication Sig Dispense Refill  . aspirin 81 MG tablet Take 81 mg by mouth daily.      . Calcium Carbonate-Vitamin D (CALCIUM + D) 600-200 MG-UNIT TABS Take 1 tablet by mouth 2 (two) times daily.       . cetirizine (ZYRTEC) 10 MG tablet Take 10 mg by mouth daily as needed. allergies      . Mirabegron ER (MYRBETRIQ) 50 MG TB24 Take 50 mg by mouth daily.       . Multiple Vitamin (MULTIVITAMIN) tablet Take 1 tablet by mouth daily.        . Omega-3 Fatty Acids (FISH OIL) 600 MG CAPS Take 2 capsules by mouth daily.      Marland Kitchen warfarin (COUMADIN) 5 MG tablet Take 5 mg by mouth daily.      Marland Kitchen albuterol (PROVENTIL HFA;VENTOLIN HFA) 108 (90 BASE) MCG/ACT inhaler Inhale 2 puffs into the lungs every 6 (six) hours as needed for wheezing.  1 Inhaler  2  . cholestyramine (QUESTRAN) 4 GM/DOSE powder Take 4 g by  mouth daily.       . fluticasone (FLONASE) 50 MCG/ACT nasal spray Place 2 sprays into the nose daily.         PHYSICAL EXAM: Filed Vitals:   10/21/12 1147  BP: 112/90  Pulse: 87    General:  Well appearing. No resp difficulty HEENT: normal Neck: supple. JVP flat. Carotids 2+ bilaterally; no bruits. No lymphadenopathy or thryomegaly appreciated. Cor: PMI normal. Regular rate & rhythm. No rubs, gallops or murmurs. Lungs: clear Abdomen: soft, nontender, nondistended. No hepatosplenomegaly. No bruits or masses. Good bowel sounds. Extremities: no cyanosis, clubbing, rash, trace edema Neuro: alert & orientedx3, cranial nerves grossly intact. Moves all 4 extremities w/o difficulty. Affect pleasant.   Lab Results  Component  Value Date   CHOL 127 07/14/2012   HDL 56 07/14/2012   LDLCALC 53 07/14/2012   LDLDIRECT 97.4 11/09/2011   TRIG 89 07/14/2012   CHOLHDL 2.3 07/14/2012    ECG:  AF 88 No ST-T wave abnormalities.     ASSESSMENT & PLAN:

## 2012-10-21 NOTE — Addendum Note (Signed)
Encounter addended by: Noralee Space, RN on: 10/21/2012  1:15 PM<BR>     Documentation filed: Patient Instructions Section, Orders

## 2012-10-21 NOTE — Assessment & Plan Note (Signed)
Chronic. Well rate controlled. Discussed switching to NOAC. Will stop coumadin today. Check INR on Thursday. If INR < 2.0 start apixaban 5 mg bid.

## 2012-10-21 NOTE — Assessment & Plan Note (Signed)
Likely venous insufficiency. Have suggested compression stockings. Can take lasix 20mg  (with 20 meq potassium) as needed for moderate to severe edema.

## 2012-10-21 NOTE — Patient Instructions (Signed)
Stop Coumadin, go for coumadin check on 11/14, if INR less than 2 start Eliquis 2.5 mg Twice daily, if med is to expensive let me know  Take Furosemide 20 mg and K-dur 20 meq daily as needed for swelling   We will contact you in 1 year to schedule your next appointment.

## 2012-10-23 ENCOUNTER — Encounter: Payer: Medicare Other | Admitting: Physical Therapy

## 2012-10-23 ENCOUNTER — Ambulatory Visit (INDEPENDENT_AMBULATORY_CARE_PROVIDER_SITE_OTHER): Payer: Medicare Other | Admitting: Family

## 2012-10-23 DIAGNOSIS — Z7901 Long term (current) use of anticoagulants: Secondary | ICD-10-CM

## 2012-10-28 ENCOUNTER — Encounter (HOSPITAL_COMMUNITY): Payer: Medicare Other

## 2012-12-11 ENCOUNTER — Ambulatory Visit (INDEPENDENT_AMBULATORY_CARE_PROVIDER_SITE_OTHER)
Admission: RE | Admit: 2012-12-11 | Discharge: 2012-12-11 | Disposition: A | Discharge status: Home / Self Care | Payer: Medicare Other | Source: Ambulatory Visit | Attending: Family Medicine | Admitting: Family Medicine

## 2012-12-11 ENCOUNTER — Ambulatory Visit (INDEPENDENT_AMBULATORY_CARE_PROVIDER_SITE_OTHER): Payer: Medicare Other | Admitting: Family Medicine

## 2012-12-11 ENCOUNTER — Encounter: Payer: Self-pay | Admitting: Family Medicine

## 2012-12-11 VITALS — BP 104/70 | Temp 98.3°F | Wt 198.0 lb

## 2012-12-11 DIAGNOSIS — M545 Low back pain, unspecified: Secondary | ICD-10-CM

## 2012-12-11 LAB — POCT URINALYSIS DIPSTICK
Glucose, UA: NEGATIVE
Nitrite, UA: NEGATIVE
Spec Grav, UA: >=1.03
Urobilinogen, UA: NEGATIVE

## 2012-12-11 LAB — HEPATIC FUNCTION PANEL
Bilirubin, Direct: 0 mg/dL (ref 0.0–0.3)
Total Protein: 7 g/dL (ref 6.0–8.3)

## 2012-12-11 LAB — CBC WITH DIFFERENTIAL/PLATELET
Basophils Absolute: 0.1 10*3/uL (ref 0.0–0.1)
Eosinophils Absolute: 0.1 10*3/uL (ref 0.0–0.7)
Hemoglobin: 14.2 g/dL (ref 13.0–17.0)
Lymphocytes Relative: 19.7 % (ref 12.0–46.0)
MCHC: 34.3 g/dL (ref 30.0–36.0)
Neutro Abs: 5.8 10*3/uL (ref 1.4–7.7)
Neutrophils Relative %: 71.6 % (ref 43.0–77.0)
Platelets: 194 10*3/uL (ref 150.0–400.0)
RDW: 14.2 % (ref 11.5–14.6)

## 2012-12-11 LAB — BASIC METABOLIC PANEL
BUN: 14 mg/dL (ref 6–23)
CO2: 26 mEq/L (ref 19–32)
Calcium: 9.4 mg/dL (ref 8.4–10.5)
Creatinine, Ser: 1 mg/dL (ref 0.4–1.5)
Glucose, Bld: 85 mg/dL (ref 70–99)

## 2012-12-11 NOTE — Progress Notes (Signed)
Subjective:    Patient ID: Maurice Wood, male    DOB: 10-16-1933, 79 y.o.   MRN: [id number]  HPI Patient seen with diffuse bilateral lumbar back pain over the past 5 weeks or so. No injury. He describes as a soreness which is intermittent 7/10 severity at worst. Back pain not clearly triggered by anything such as movement. Has episodes at rest. He has noted some relief occasionally with urination but has not had any consistent burning with urination. Tylenol helps occasionally. No reported injury. Patient denies any fever, chills, appetite changes, or any weight changes.  Patient has chronic problems including type 2 diabetes, hyperlipidemia, obstructive sleep apnea, CAD, atrial fibrillation, BPH. He is followed by urologist and apparently has elevated PSA previously been no history of recurrent prostatitis.  Past Medical History  Diagnosis Date  . Atrial fibrillation Jan 2007    echo 1-07 normal ejection fraction, no significant valvular disease. adenosine cardiolite 1-07  no evidence of ischemia. A 48 hour Holter monitor in 7-07 showed good rate controlw/ chronic atrial fibrillation. Cath 1/11 normal cors. EF 45%. Echo 2-11 60-65%  . Obstructive sleep apnea     noncompliant with CPAP  . Cerebral aneurysm     followed by Dr Corliss Skains  . History of chest pain     Cardia catheterization 1999 was normal. myoview 2007 normal. cardiac cath jan 2011 normal cors EF 45%  . Obesity   . Hypertension   . Fatigue   . Allergy     rhinitis  . Hx of colonic polyps     diverticulosis  . Hyperlipidemia   . Retinal vein occlusion   . Benign prostatic hypertrophy   . IBS (irritable bowel syndrome)   . Diabetes mellitus     Elevated glucose/pre-dm  . Incontinence     Per pt 12/14/11   Past Surgical History  Procedure Date  . Cholecystectomy   . Inguinal and umbilical herniorrhaphy   . Tonsillectomy and adenoidectomy as a child  . Cardiac catheterization 1999    reports that he has quit smoking.  He does not have any smokeless tobacco history on file. He reports that he drinks alcohol. His drug history not on file. family history includes Alcohol abuse in his father; Cancer in his father and maternal grandmother; and Diabetes in his maternal uncle, paternal uncle, and son. Allergies  Allergen Reactions  . Clarithromycin     REACTION: Headache  . Epinephrine     REACTION: Increased Heart Rate  . Nimodipine     REACTION: Flushing      Review of Systems  Constitutional: Negative for fever, chills, appetite change and unexpected weight change.  Respiratory: Negative for cough and shortness of breath.   Cardiovascular: Negative for chest pain.  Gastrointestinal: Negative for nausea, vomiting, abdominal pain, diarrhea and constipation.  Genitourinary: Negative for dysuria.  Musculoskeletal: Positive for back pain.  Skin: Negative for rash.  Hematological: Negative for adenopathy.       Objective:   Physical Exam  Constitutional: He appears well-developed and well-nourished.  Neck: Neck supple.  Cardiovascular: Normal rate.   Pulmonary/Chest: Effort normal and breath sounds normal. No respiratory distress. He has no wheezes. He has no rales.  Abdominal: Soft. There is no tenderness.  Genitourinary: Rectum normal and prostate normal.       Prostate is nontender  Musculoskeletal: He exhibits no edema.       Straight leg raise is negative  Lymphadenopathy:    He has no cervical adenopathy.  Neurological:       No focal weakness lower extremities          Assessment & Plan:  Nonspecific lumbar back pain. Given duration (and pt's age) will start with some basic labs including sedimentation rate, CBC, chemistries. Lumbar spine films. He does not have any red flags such as weight loss or fever.  No evidence for acute prostatitis.

## 2012-12-12 NOTE — Progress Notes (Signed)
Quick Note:  Pt informed ______ 

## 2012-12-18 ENCOUNTER — Telehealth (HOSPITAL_COMMUNITY): Payer: Self-pay | Admitting: *Deleted

## 2012-12-18 NOTE — Telephone Encounter (Signed)
Per [facility] pt needed Prior Auth for Eliquis, called [phone] and med was approved through 12/18/2013, ref # [id number], [facility] and pt's wife are aware

## 2013-01-11 NOTE — Assessment & Plan Note (Signed)
Check labs Currently no meds Reiterated, again, the need for aggressive weight loss and daily exercise

## 2013-01-11 NOTE — Assessment & Plan Note (Signed)
Warfarin stopped by [facility]- now on apixiban (december 2013)

## 2013-01-11 NOTE — Progress Notes (Signed)
Patient ID: Maurice Wood, male   DOB: Jan 12, 1933, 79 y.o.   MRN: [id number]   patient comes in for followup of multiple medical problems including type 2 diabetes, hyperlipidemia, hypertension. The patient does check blood sugar (range 100-130), but not blood pressure at home. The patetient does not follow an exercise or diet program. The patient denies any polyuria, polydipsia.  In the past the patient has gone to diabetic treatment center. The patient is tolerating medications  Without difficulty. The patient does admit to medication compliance.   He is requesting prescriptions  Reviewed PMH, PSH, sochx, meds   patient denies chest pain, shortness of breath, orthopnea. Denies lower extremity edema, abdominal pain, change in appetite, change in bowel movements. Patient denies rashes, musculoskeletal complaints. No other specific complaints in a complete review of systems. Only complains of hiccoughs (can occur daily) and admits to chronic fatigue ("just don't want to do anything")  overweithg male in no acute distress. HEENT exam atraumatic, normocephalic, neck supple without jugular venous distention. Chest clear to auscultation cardiac exam S1-S2 are irregular. Abdominal exam overweight with bowel sounds, soft and nontender. Extremities no edema. Neurologic exam is alert with a normal gait.

## 2013-01-11 NOTE — Assessment & Plan Note (Signed)
Lab Results  Component Value Date   HGB 14.2 12/11/2012  resolved- rev=moved from problem list

## 2013-01-12 ENCOUNTER — Encounter: Payer: Self-pay | Admitting: Internal Medicine

## 2013-01-12 ENCOUNTER — Ambulatory Visit (INDEPENDENT_AMBULATORY_CARE_PROVIDER_SITE_OTHER): Payer: Medicare Other | Admitting: Internal Medicine

## 2013-01-12 VITALS — BP 110/72 | HR 88 | Temp 98.3°F | Wt 195.0 lb

## 2013-01-12 DIAGNOSIS — D649 Anemia, unspecified: Secondary | ICD-10-CM

## 2013-01-12 DIAGNOSIS — R066 Hiccough: Secondary | ICD-10-CM

## 2013-01-12 DIAGNOSIS — Z7901 Long term (current) use of anticoagulants: Secondary | ICD-10-CM

## 2013-01-12 DIAGNOSIS — E119 Type 2 diabetes mellitus without complications: Secondary | ICD-10-CM

## 2013-01-12 DIAGNOSIS — I4891 Unspecified atrial fibrillation: Secondary | ICD-10-CM

## 2013-01-12 DIAGNOSIS — I2510 Atherosclerotic heart disease of native coronary artery without angina pectoris: Secondary | ICD-10-CM

## 2013-01-12 LAB — LIPID PANEL
Cholesterol: 170 mg/dL (ref 0–200)
HDL: 49.9 mg/dL (ref >39.00)
LDL Cholesterol: 91 mg/dL (ref 0–99)
Total CHOL/HDL Ratio: 3
Triglycerides: 147 mg/dL (ref 0.0–149.0)
VLDL: 29.4 mg/dL (ref 0.0–40.0)

## 2013-01-12 LAB — CBC WITH DIFFERENTIAL/PLATELET
Basophils Absolute: 0 10*3/uL (ref 0.0–0.1)
Basophils Relative: 0.6 % (ref 0.0–3.0)
Eosinophils Absolute: 0.1 10*3/uL (ref 0.0–0.7)
Eosinophils Relative: 1.6 % (ref 0.0–5.0)
HCT: 44.1 % (ref 39.0–52.0)
Hemoglobin: 14.9 g/dL (ref 13.0–17.0)
Lymphocytes Relative: 16.7 % (ref 12.0–46.0)
Lymphs Abs: 1.3 10*3/uL (ref 0.7–4.0)
MCHC: 33.7 g/dL (ref 30.0–36.0)
MCV: 88.7 fl (ref 78.0–100.0)
Monocytes Absolute: 0.5 10*3/uL (ref 0.1–1.0)
Monocytes Relative: 5.8 % (ref 3.0–12.0)
Neutro Abs: 5.9 10*3/uL (ref 1.4–7.7)
Neutrophils Relative %: 75.3 % (ref 43.0–77.0)
Platelets: 160 10*3/uL (ref 150.0–400.0)
RBC: 4.97 Mil/uL (ref 4.22–5.81)
RDW: 14.8 % — ABNORMAL HIGH (ref 11.5–14.6)
WBC: 7.8 10*3/uL (ref 4.5–10.5)

## 2013-01-12 LAB — HEPATIC FUNCTION PANEL
ALT: 13 U/L (ref 0–53)
AST: 20 U/L (ref 0–37)
Albumin: 3.6 g/dL (ref 3.5–5.2)
Alkaline Phosphatase: 66 U/L (ref 39–117)
Bilirubin, Direct: 0 mg/dL (ref 0.0–0.3)
Total Bilirubin: 0.9 mg/dL (ref 0.3–1.2)
Total Protein: 6.9 g/dL (ref 6.0–8.3)

## 2013-01-12 LAB — BASIC METABOLIC PANEL
BUN: 12 mg/dL (ref 6–23)
CO2: 25 mEq/L (ref 19–32)
Calcium: 9.3 mg/dL (ref 8.4–10.5)
Chloride: 108 mEq/L (ref 96–112)
Creatinine, Ser: 1 mg/dL (ref 0.4–1.5)
GFR: 78.75 mL/min (ref >60.00)
Glucose, Bld: 109 mg/dL — ABNORMAL HIGH (ref 70–99)
Potassium: 4.7 mEq/L (ref 3.5–5.1)
Sodium: 140 mEq/L (ref 135–145)

## 2013-01-12 LAB — HEMOGLOBIN A1C: Hgb A1c MFr Bld: 5.9 % (ref 4.6–6.5)

## 2013-01-12 MED ORDER — OMEPRAZOLE 20 MG PO CPDR: 20.0000 mg | DELAYED_RELEASE_CAPSULE | Freq: Every day | ORAL | Status: DC

## 2013-01-12 NOTE — Assessment & Plan Note (Signed)
12/13-- stopped warfarin, started apixaban No known complications

## 2013-01-12 NOTE — Assessment & Plan Note (Signed)
No sxs,  He understands need to modify lifestyle

## 2013-01-14 ENCOUNTER — Ambulatory Visit: Payer: Medicare Other | Admitting: Internal Medicine

## 2013-02-01 ENCOUNTER — Ambulatory Visit (INDEPENDENT_AMBULATORY_CARE_PROVIDER_SITE_OTHER): Payer: Medicare Other | Admitting: Family Medicine

## 2013-02-01 VITALS — BP 98/63 | HR 103 | Temp 99.4°F | Resp 17 | Ht 70.0 in | Wt 191.0 lb

## 2013-02-01 DIAGNOSIS — J019 Acute sinusitis, unspecified: Secondary | ICD-10-CM

## 2013-02-01 MED ORDER — FLUTICASONE PROPIONATE 50 MCG/ACT NA SUSP: 2.0000 | Freq: Every day | NASAL | Status: DC

## 2013-02-01 MED ORDER — GUAIFENESIN-CODEINE 100-10 MG/5ML PO SYRP: 5.0000 mL | ORAL_SOLUTION | Freq: Four times a day (QID) | ORAL | Status: DC | PRN

## 2013-02-01 MED ORDER — AMOXICILLIN 875 MG PO TABS: 875.0000 mg | ORAL_TABLET | Freq: Three times a day (TID) | ORAL | Status: DC

## 2013-02-01 MED ORDER — IPRATROPIUM BROMIDE 0.03 % NA SOLN: 2.0000 | Freq: Two times a day (BID) | NASAL | Status: DC

## 2013-02-01 MED ORDER — BENZONATATE 100 MG PO CAPS: 100.0000 mg | ORAL_CAPSULE | Freq: Three times a day (TID) | ORAL | Status: DC | PRN

## 2013-02-01 NOTE — Patient Instructions (Signed)
Hot showers, a humidifier, or breathing in steam may help loosen the congestion.  Using a netti pot or sinus rinse is also likely to help you feel better and keep this from progressing.  Use the atrovent nasal spray as needed throughout the day and use the fluticasone nasal spray every night before bed for at least 2 weeks.  I recommend augmenting with vics vapo rub to help you move out the congestion.  If no improvement or you are getting worse, come back as you might need a course of steroids or a different antibiotic but hopefully with all of the above, you can avoid it.  Sinusitis Sinusitis is redness, soreness, and swelling (inflammation) of the paranasal sinuses. Paranasal sinuses are air pockets within the bones of your face (beneath the eyes, the middle of the forehead, or above the eyes). In healthy paranasal sinuses, mucus is able to drain out, and air is able to circulate through them by way of your nose. However, when your paranasal sinuses are inflamed, mucus and air can become trapped. This can allow bacteria and other germs to grow and cause infection. Sinusitis can develop quickly and last only a short time (acute) or continue over a long period (chronic). Sinusitis that lasts for more than 12 weeks is considered chronic.  CAUSES  Causes of sinusitis include:  Allergies.  Structural abnormalities, such as displacement of the cartilage that separates your nostrils (deviated septum), which can decrease the air flow through your nose and sinuses and affect sinus drainage.  Functional abnormalities, such as when the small hairs (cilia) that line your sinuses and help remove mucus do not work properly or are not present. SYMPTOMS  Symptoms of acute and chronic sinusitis are the same. The primary symptoms are pain and pressure around the affected sinuses. Other symptoms include:  Upper toothache.  Earache.  Headache.  Bad breath.  Decreased sense of smell and taste.  A cough,  which worsens when you are lying flat.  Fatigue.  Fever.  Thick drainage from your nose, which often is green and may contain pus (purulent).  Swelling and warmth over the affected sinuses. DIAGNOSIS  Your caregiver will perform a physical exam. During the exam, your caregiver may:  Look in your nose for signs of abnormal growths in your nostrils (nasal polyps).  Tap over the affected sinus to check for signs of infection.  View the inside of your sinuses (endoscopy) with a special imaging device with a light attached (endoscope), which is inserted into your sinuses. If your caregiver suspects that you have chronic sinusitis, one or more of the following tests may be recommended:  Allergy tests.  Nasal culture A sample of mucus is taken from your nose and sent to a lab and screened for bacteria.  Nasal cytology A sample of mucus is taken from your nose and examined by your caregiver to determine if your sinusitis is related to an allergy. TREATMENT  Most cases of acute sinusitis are related to a viral infection and will resolve on their own within 10 days. Sometimes medicines are prescribed to help relieve symptoms (pain medicine, decongestants, nasal steroid sprays, or saline sprays).  However, for sinusitis related to a bacterial infection, your caregiver will prescribe antibiotic medicines. These are medicines that will help kill the bacteria causing the infection.  Rarely, sinusitis is caused by a fungal infection. In theses cases, your caregiver will prescribe antifungal medicine. For some cases of chronic sinusitis, surgery is needed. Generally, these are cases  in which sinusitis recurs more than 3 times per year, despite other treatments. HOME CARE INSTRUCTIONS   Drink plenty of water. Water helps thin the mucus so your sinuses can drain more easily.  Use a humidifier.  Inhale steam 3 to 4 times a day (for example, sit in the bathroom with the shower running).  Apply a  warm, moist washcloth to your face 3 to 4 times a day, or as directed by your caregiver.  Use saline nasal sprays to help moisten and clean your sinuses.  Take over-the-counter or prescription medicines for pain, discomfort, or fever only as directed by your caregiver. SEEK IMMEDIATE MEDICAL CARE IF:  You have increasing pain or severe headaches.  You have nausea, vomiting, or drowsiness.  You have swelling around your face.  You have vision problems.  You have a stiff neck.  You have difficulty breathing. MAKE SURE YOU:   Understand these instructions.  Will watch your condition.  Will get help right away if you are not doing well or get worse. Document Released: 11/26/2005 Document Revised: 02/18/2012 Document Reviewed: 12/11/2011 [facility] Patient Information 2013 [place], [place].

## 2013-02-01 NOTE — Progress Notes (Signed)
Subjective:    Patient ID: Maurice Wood, male    DOB: Jun 07, 1933, 79 y.o.   MRN: [id number] Chief Complaint  Patient presents with  . Cough  . Nasal Congestion     HPI  Sxs started 2-3d ago with sniffles, sneezing, coughing and has progressively worsened - couldn't sleep at all last night due to cough and sneezing, rhinitis.  Has had some fever, HAs on top of head, some ear pain but no discharge, lots of sinus pressure, throat not to sore. Cough not productive, no SHoB or CP.  Tol po well.  Past Medical History  Diagnosis Date  . Atrial fibrillation Jan 2007    echo 1-07 normal ejection fraction, no significant valvular disease. adenosine cardiolite 1-07  no evidence of ischemia. A 48 hour Holter monitor in 7-07 showed good rate controlw/ chronic atrial fibrillation. Cath 1/11 normal cors. EF 45%. Echo 2-11 60-65%  . Obstructive sleep apnea     noncompliant with CPAP  . Cerebral aneurysm     followed by Dr Corliss Skains  . History of chest pain     Cardia catheterization 1999 was normal. myoview 2007 normal. cardiac cath jan 2011 normal cors EF 45%  . Obesity   . Hypertension   . Fatigue   . Allergy     rhinitis  . Hx of colonic polyps     diverticulosis  . Hyperlipidemia   . Retinal vein occlusion   . Benign prostatic hypertrophy   . IBS (irritable bowel syndrome)   . Diabetes mellitus     Elevated glucose/pre-dm  . Incontinence     Per pt 12/14/11   Current Outpatient Prescriptions on File Prior to Visit  Medication Sig Dispense Refill  . apixaban (ELIQUIS) 2.5 MG TABS tablet Take 1 tablet (2.5 mg total) by mouth 2 (two) times daily.  60 tablet  6  . aspirin 81 MG tablet Take 81 mg by mouth daily.      . Calcium Carbonate-Vitamin D (CALCIUM + D) 600-200 MG-UNIT TABS Take 1 tablet by mouth 2 (two) times daily.       . cetirizine (ZYRTEC) 10 MG tablet Take 10 mg by mouth daily as needed. allergies      . Mirabegron ER (MYRBETRIQ) 50 MG TB24 Take 50 mg by mouth daily.       .  Multiple Vitamin (MULTIVITAMIN) tablet Take 1 tablet by mouth daily.        . Omega-3 Fatty Acids (FISH OIL) 600 MG CAPS Take 2 capsules by mouth daily.      Marland Kitchen omeprazole (PRILOSEC) 20 MG capsule Take 1 capsule (20 mg total) by mouth daily.  30 capsule  3   No current facility-administered medications on file prior to visit.   Allergies  Allergen Reactions  . Clarithromycin     REACTION: Headache  . Epinephrine     REACTION: Increased Heart Rate  . Nimodipine     REACTION: Flushing     Review of Systems  Constitutional: Positive for fever, chills, diaphoresis, activity change and fatigue. Negative for appetite change and unexpected weight change.  HENT: Positive for ear pain, congestion, rhinorrhea, sneezing, postnasal drip and sinus pressure. Negative for nosebleeds, sore throat, facial swelling, mouth sores, trouble swallowing, neck pain, neck stiffness, voice change and ear discharge.   Respiratory: Positive for cough. Negative for shortness of breath.   Cardiovascular: Negative for chest pain.  Gastrointestinal: Negative for nausea, vomiting, abdominal pain, diarrhea and constipation.  Genitourinary: Negative for dysuria.  Musculoskeletal: Negative for myalgias.  Skin: Negative for rash.  Neurological: Positive for headaches. Negative for syncope.  Hematological: Negative for adenopathy.  Psychiatric/Behavioral: Positive for sleep disturbance.      BP 98/63  Pulse 103  Temp(Src) 99.4 F (37.4 C) (Oral)  Resp 17  Ht 5\' 10"  (1.778 m)  Wt 191 lb (86.637 kg)  BMI 27.41 kg/m2  SpO2 99% Objective:   Physical Exam  Constitutional: He is oriented to person, place, and time. He appears well-developed and well-nourished. No distress.  HENT:  Head: Normocephalic and atraumatic.  Right Ear: External ear and ear canal normal. A middle ear effusion is present.  Left Ear: External ear normal.  Nose: Mucosal edema and rhinorrhea present. Right sinus exhibits maxillary sinus  tenderness and frontal sinus tenderness. Left sinus exhibits maxillary sinus tenderness and frontal sinus tenderness.  Mouth/Throat: Uvula is midline. Mucous membranes are not pale and dry. Posterior oropharyngeal edema present. No oropharyngeal exudate, posterior oropharyngeal erythema or tonsillar abscesses.  Left canal obstructed by cerumen  Eyes: Conjunctivae are normal. Right eye exhibits no discharge. Left eye exhibits no discharge. No scleral icterus.  Neck: Normal range of motion. Neck supple. No thyromegaly present.  Cardiovascular: Normal rate, regular rhythm, normal heart sounds and intact distal pulses.   Pulmonary/Chest: Effort normal and breath sounds normal. No respiratory distress.  Lymphadenopathy:       Head (right side): Submandibular adenopathy present.       Head (left side): Submandibular adenopathy present.    He has no cervical adenopathy.       Right: No supraclavicular adenopathy present.       Left: No supraclavicular adenopathy present.  Neurological: He is alert and oriented to person, place, and time.  Skin: Skin is warm and dry. He is not diaphoretic. No erythema.  Psychiatric: He has a normal mood and affect. His behavior is normal.      Assessment & Plan:  Sinusitis, acute - Plan: amoxicillin (AMOXIL) 875 MG tablet, guaiFENesin-codeine (ROBITUSSIN AC) 100-10 MG/5ML syrup, fluticasone (FLONASE) 50 MCG/ACT nasal spray, ipratropium (ATROVENT) 0.03 % nasal spray, benzonatate (TESSALON) 100 MG capsule See pt instructions for details on treatment. Meds ordered this encounter  Medications  . amoxicillin (AMOXIL) 875 MG tablet    Sig: Take 1 tablet (875 mg total) by mouth 3 (three) times daily.    Dispense:  30 tablet    Refill:  0  . guaiFENesin-codeine (ROBITUSSIN AC) 100-10 MG/5ML syrup    Sig: Take 5 mLs by mouth 4 (four) times daily as needed for cough.    Dispense:  240 mL    Refill:  0  . fluticasone (FLONASE) 50 MCG/ACT nasal spray    Sig: Place 2  sprays into the nose daily.    Dispense:  16 g    Refill:  6  . ipratropium (ATROVENT) 0.03 % nasal spray    Sig: Place 2 sprays into the nose 2 (two) times daily.    Dispense:  30 mL    Refill:  5  . benzonatate (TESSALON) 100 MG capsule    Sig: Take 1 capsule (100 mg total) by mouth 3 (three) times daily as needed for cough.    Dispense:  30 capsule    Refill:  0

## 2013-06-11 ENCOUNTER — Encounter: Payer: Self-pay | Admitting: Neurology

## 2013-06-11 ENCOUNTER — Ambulatory Visit (INDEPENDENT_AMBULATORY_CARE_PROVIDER_SITE_OTHER): Payer: Medicare Other | Admitting: Neurology

## 2013-06-11 VITALS — BP 114/76 | HR 97 | Wt 191.0 lb

## 2013-06-11 DIAGNOSIS — I2510 Atherosclerotic heart disease of native coronary artery without angina pectoris: Secondary | ICD-10-CM

## 2013-06-11 DIAGNOSIS — I10 Essential (primary) hypertension: Secondary | ICD-10-CM

## 2013-06-11 DIAGNOSIS — G4730 Sleep apnea, unspecified: Secondary | ICD-10-CM

## 2013-06-11 DIAGNOSIS — R32 Unspecified urinary incontinence: Secondary | ICD-10-CM

## 2013-06-11 DIAGNOSIS — E785 Hyperlipidemia, unspecified: Secondary | ICD-10-CM

## 2013-06-11 DIAGNOSIS — I4891 Unspecified atrial fibrillation: Secondary | ICD-10-CM

## 2013-06-11 NOTE — Progress Notes (Signed)
Maurice Wood is a 79 years old right-handed Caucasian male, accompanied by his wife, followup for his gait difficulty, previous history of right internal carotid artery dissection, patient of Dr. love  He had a past medical history of peripheral neuropathy, chronic low back pain, atrial fibrillation, was previously taking Coumadin, in November 2013, was switched to Eiquis, his cardiologist is Dr. Crosby Oyster, his primary care physician is Dr. Orma Render  He was diagnosed with right internal carotid artery dissection in 2003, there was no focal symptoms, he had his first arteriogram November 2003 by Dr. Maximino Sarin, repeat angiogram June 2004, showed decreased irregularity of the right internal carotid artery dissection, 5 mm right MCA  aneurysm, he was previously also followed by Dr. Esmond Plants, later by Dr. Mikal Plane, most recent repeat MRI, MRA of the brain without contrast was in May 2011, there was no significant change, he had old infarction in the left cerebellum, chronic vascular disease,  Since 2009, he began to have bladder incontinence, occasionally bowel incontinence, especially when he suffered diarrhea, there was no etiology found, he also complains of bilateral feet paresthesia, EMG nerve conduction study in August 2012 demonstrated peripheral neuropathy, there was no evidence of demyelinating features   Laboratory evaluation showed normal CPK, B12, protein electrophoresis, TSH, , 24 hours during heavy-metal, elevated glucose 238, at one hour,  217 at 2 hours,  He has chronic low back pain, at his waistline, occasionally radiating to his bilateral lower extremity, since 2009, he also developed gradual onset gait difficulty, unsteadiness, he does not exercise regularly, he has a history of sleep apnea, but not using CPAP machine  Review of the system, is positive for fatigue, atrial fibrillation, hearing loss, easy bruising, easy bleeding, dizziness, decreased energy, obesity  PHYSICAL  EXAMINATOINS:  Generalized: In no acute distress  Neck: Supple, no carotid bruits   Cardiac: Regular rate rhythm  Pulmonary: Clear to auscultation bilaterally  Musculoskeletal: No deformity  Neurological examination  Mentation: Alert oriented to time, place, history taking, and causual conversation  Cranial nerve II-XII: Pupils were equal round reactive to light extraocular movements were full, visual field were full on confrontational test. facial sensation and strength were normal. hearing was intact to finger rubbing bilaterally. Uvula tongue midline.  head turning and shoulder shrug and were normal and symmetric.Tongue protrusion into cheek strength was normal.  Motor: he has mild to moderate bilateral hip flexion, knee extension weakness, there was no significant bilateral ankle dorsiflexion/ankle plantar flexion weakness.  Sensory: Intact to fine touch, pinprick, preserved vibratory sensation, and proprioception at toes.  Coordination: Normal finger to nose, heel-to-shin bilaterally there was no truncal ataxia  Gait: Rising up from seated position by pushing on chair arm, he could not walk on tiptoe, or heels,   Romberg signs: mildly positive  Deep tendon reflexes: Brachioradialis 2/2, biceps 2/2, triceps 2/2, patellar 2/2, Achilles 0/0, plantar responses were flexor bilaterally.  Assessment and plan: 79 years old right-handed Caucasian male, with past medical history of chronic low back pain, also with past medical history of occasionally bowel bladder incontinence, now presenting with progressive worsening gait difficulty, he has mild bilateral hip flexion, knee extension weakness, preserved patellar reflexes, absent ankle reflexes,  1.  differentiation diagnosis including lumbar stenosis 2  MRI of lumbar spine, I will call him report, if it is abnormal, would discuss further treatment plans depend on findings .

## 2013-06-25 ENCOUNTER — Ambulatory Visit
Admission: RE | Admit: 2013-06-25 | Discharge: 2013-06-25 | Disposition: A | Discharge status: Home / Self Care | Payer: Medicare Other | Source: Ambulatory Visit | Attending: Neurology | Admitting: Neurology

## 2013-06-25 DIAGNOSIS — E785 Hyperlipidemia, unspecified: Secondary | ICD-10-CM

## 2013-06-25 DIAGNOSIS — I10 Essential (primary) hypertension: Secondary | ICD-10-CM

## 2013-06-25 DIAGNOSIS — I2510 Atherosclerotic heart disease of native coronary artery without angina pectoris: Secondary | ICD-10-CM

## 2013-06-25 DIAGNOSIS — R32 Unspecified urinary incontinence: Secondary | ICD-10-CM

## 2013-06-25 DIAGNOSIS — R269 Unspecified abnormalities of gait and mobility: Secondary | ICD-10-CM

## 2013-06-25 DIAGNOSIS — I4891 Unspecified atrial fibrillation: Secondary | ICD-10-CM

## 2013-06-25 DIAGNOSIS — G4730 Sleep apnea, unspecified: Secondary | ICD-10-CM

## 2013-06-29 ENCOUNTER — Telehealth: Payer: Self-pay | Admitting: Neurology

## 2013-06-29 NOTE — Progress Notes (Signed)
Quick Note:  MRI scan lumbar spine showing spondylitic changes and facet arthropathy at L3-4 resulting in mild bi-foraminal stenosis and at L4-5 resulting in moderate bi-foraminal stenosis. At L5-S1 the is mild spondylolisthesiswith moderate right-sided foraminal narrowing. Overall the degenerative changes appear to be progressed compared with previous MRI scan dated 11/17/2011.   No change in treatment plan.  ______

## 2013-06-29 NOTE — Telephone Encounter (Signed)
Dana: please give him a follow up appointment in 6-9 months

## 2013-07-13 ENCOUNTER — Ambulatory Visit: Payer: Medicare Other | Admitting: Internal Medicine

## 2013-07-22 ENCOUNTER — Telehealth: Payer: Self-pay | Admitting: Internal Medicine

## 2013-07-22 NOTE — Telephone Encounter (Signed)
Pt stopped by to request samples of apixaban (ELIQUIS) 2.5 MG TABS tablet. Please assist.

## 2013-07-24 ENCOUNTER — Telehealth (HOSPITAL_COMMUNITY): Payer: Self-pay | Admitting: *Deleted

## 2013-07-24 NOTE — Telephone Encounter (Signed)
No samples available, pt aware 

## 2013-07-24 NOTE — Telephone Encounter (Signed)
Pt called concerned about the cost of Eliquis, he has hit the donut whole and it is now very expensive, have given pt samples for 1 month and provided the phone number for [facility] squibb [phone]) to see if he will qualify for patient assistance, he will give them a call and let me know what happens

## 2013-07-27 ENCOUNTER — Ambulatory Visit (INDEPENDENT_AMBULATORY_CARE_PROVIDER_SITE_OTHER): Payer: Medicare Other | Admitting: Internal Medicine

## 2013-07-27 ENCOUNTER — Ambulatory Visit: Payer: Medicare Other | Admitting: Internal Medicine

## 2013-07-27 ENCOUNTER — Encounter: Payer: Self-pay | Admitting: Internal Medicine

## 2013-07-27 VITALS — BP 104/76 | HR 64 | Temp 98.0°F | Wt 196.0 lb

## 2013-07-27 DIAGNOSIS — I4891 Unspecified atrial fibrillation: Secondary | ICD-10-CM

## 2013-07-27 DIAGNOSIS — E1159 Type 2 diabetes mellitus with other circulatory complications: Secondary | ICD-10-CM

## 2013-07-27 DIAGNOSIS — E119 Type 2 diabetes mellitus without complications: Secondary | ICD-10-CM

## 2013-07-27 LAB — CBC
HCT: 43 % (ref 39.0–52.0)
MCHC: 33.7 g/dL (ref 30.0–36.0)
MCV: 91 fl (ref 78.0–100.0)
RDW: 14.6 % (ref 11.5–14.6)

## 2013-07-27 LAB — HEMOGLOBIN A1C: Hgb A1c MFr Bld: 5.9 % (ref 4.6–6.5)

## 2013-07-27 LAB — HEPATIC FUNCTION PANEL
ALT: 11 U/L (ref 0–53)
Alkaline Phosphatase: 60 U/L (ref 39–117)
Bilirubin, Direct: 0 mg/dL (ref 0.0–0.3)
Total Bilirubin: 0.9 mg/dL (ref 0.3–1.2)
Total Protein: 6.7 g/dL (ref 6.0–8.3)

## 2013-07-27 LAB — MICROALBUMIN / CREATININE URINE RATIO
Creatinine,U: 136.5 mg/dL
Microalb, Ur: 0.6 mg/dL (ref 0.0–1.9)

## 2013-07-27 LAB — LIPID PANEL
Cholesterol: 171 mg/dL (ref 0–200)
Triglycerides: 181 mg/dL — ABNORMAL HIGH (ref 0.0–149.0)

## 2013-07-27 LAB — BASIC METABOLIC PANEL
BUN: 14 mg/dL (ref 6–23)
CO2: 25 mEq/L (ref 19–32)
Calcium: 9.3 mg/dL (ref 8.4–10.5)
Chloride: 111 mEq/L (ref 96–112)
Creatinine, Ser: 1 mg/dL (ref 0.4–1.5)
Glucose, Bld: 109 mg/dL — ABNORMAL HIGH (ref 70–99)

## 2013-07-27 LAB — HM DIABETES FOOT EXAM

## 2013-07-27 MED ORDER — AZELASTINE HCL 0.1 % NA SOLN: 1.0000 | Freq: Two times a day (BID) | NASAL | Status: DC

## 2013-07-27 NOTE — Progress Notes (Signed)
Dm- never treated  afib- on eliquis without significant bleeding  "drippy nose"- no relief with steroid nasal spray.   Lipids- no meds  Past Medical History  Diagnosis Date  . Atrial fibrillation Jan 2007    echo 1-07 normal ejection fraction, no significant valvular disease. adenosine cardiolite 1-07  no evidence of ischemia. A 48 hour Holter monitor in 7-07 showed good rate controlw/ chronic atrial fibrillation. Cath 1/11 normal cors. EF 45%. Echo 2-11 60-65%  . Obstructive sleep apnea     noncompliant with CPAP  . Cerebral aneurysm     followed by Dr Corliss Skains  . History of chest pain     Cardia catheterization 1999 was normal. myoview 2007 normal. cardiac cath jan 2011 normal cors EF 45%  . Obesity   . Hypertension   . Fatigue   . Allergy     rhinitis  . Hx of colonic polyps     diverticulosis  . Hyperlipidemia   . Retinal vein occlusion   . Benign prostatic hypertrophy   . IBS (irritable bowel syndrome)   . Diabetes mellitus     Elevated glucose/pre-dm  . Incontinence     Per pt 12/14/11    History   Social History  . Marital Status: Married    Spouse Name: N/A    Number of Children: N/A  . Years of Education: N/A   Occupational History  . Not on file.   Social History Main Topics  . Smoking status: Former Games developer  . Smokeless tobacco: Not on file     Comment: quit 1973  . Alcohol Use: Yes  . Drug Use: No  . Sexual Activity: Yes    Birth Control/ Protection: None   Other Topics Concern  . Not on file   Social History Narrative  . No narrative on file    Past Surgical History  Procedure Laterality Date  . Cholecystectomy    . Inguinal and umbilical herniorrhaphy    . Tonsillectomy and adenoidectomy  as a child  . Cardiac catheterization  1999  . Hernia repair      Family History  Problem Relation Age of Onset  . Cancer Father     lung  . Alcohol abuse Father   . Aneurysm Father     Aortic aneurysm  . Diabetes Son   . Cancer Maternal  Grandmother     colon  . Diabetes Maternal Uncle   . Diabetes Paternal Uncle     Allergies  Allergen Reactions  . Clarithromycin     REACTION: Headache  . Epinephrine     REACTION: Increased Heart Rate  . Nimodipine     REACTION: Flushing    Current Outpatient Prescriptions on File Prior to Visit  Medication Sig Dispense Refill  . apixaban (ELIQUIS) 2.5 MG TABS tablet Take 1 tablet (2.5 mg total) by mouth 2 (two) times daily.  60 tablet  6  . aspirin 81 MG tablet Take 81 mg by mouth daily.      . Calcium Carbonate-Vitamin D (CALCIUM + D) 600-200 MG-UNIT TABS Take 1 tablet by mouth daily.       . cetirizine (ZYRTEC) 10 MG tablet Take 10 mg by mouth daily as needed. allergies      . fluticasone (FLONASE) 50 MCG/ACT nasal spray Place 2 sprays into the nose daily.  16 g  6  . Mirabegron ER (MYRBETRIQ) 50 MG TB24 Take 50 mg by mouth daily.       . Multiple Vitamin (  MULTIVITAMIN) tablet Take 1 tablet by mouth daily.        . Omega-3 Fatty Acids (FISH OIL) 600 MG CAPS Take 2 capsules by mouth daily.       No current facility-administered medications on file prior to visit.     patient denies chest pain, shortness of breath, orthopnea. Denies lower extremity edema, abdominal pain, change in appetite, change in bowel movements. Patient denies rashes, musculoskeletal complaints. No other specific complaints in a complete review of systems.   BP 104/76  Pulse 64  Temp(Src) 98 F (36.7 C) (Oral)  Wt 196 lb (88.905 kg)  BMI 28.12 kg/m2   well-developed well-nourished male in no acute distress. HEENT exam atraumatic, normocephalic, neck supple without jugular venous distention. Chest clear to auscultation cardiac exam S1-S2 are irregular. Abdominal exam overweight with bowel sounds, soft and nontender. Extremities no edema. Neurologic exam is alert with a normal gait.

## 2013-07-30 NOTE — Assessment & Plan Note (Signed)
Rate is controlled Continue same meds Discussed eliquis and that there is no need for PT monitoring

## 2013-07-30 NOTE — Assessment & Plan Note (Signed)
No meds at this time Lab Results  Component Value Date   HGBA1C 5.9 07/27/2013   He needs to lose weight. He voices understanding (again)

## 2013-08-05 ENCOUNTER — Ambulatory Visit: Payer: Medicare Other | Admitting: Internal Medicine

## 2013-08-13 ENCOUNTER — Ambulatory Visit (INDEPENDENT_AMBULATORY_CARE_PROVIDER_SITE_OTHER): Payer: Medicare Other

## 2013-08-13 DIAGNOSIS — Z23 Encounter for immunization: Secondary | ICD-10-CM

## 2013-09-06 ENCOUNTER — Encounter (HOSPITAL_COMMUNITY): Payer: Self-pay | Admitting: Emergency Medicine

## 2013-09-06 ENCOUNTER — Emergency Department (HOSPITAL_COMMUNITY)
Admission: EM | Admit: 2013-09-06 | Discharge: 2013-09-06 | Disposition: A | Discharge status: Home / Self Care | Payer: Medicare Other | Attending: Emergency Medicine | Admitting: Emergency Medicine

## 2013-09-06 ENCOUNTER — Emergency Department (HOSPITAL_COMMUNITY): Payer: Medicare Other

## 2013-09-06 DIAGNOSIS — E785 Hyperlipidemia, unspecified: Secondary | ICD-10-CM | POA: Insufficient documentation

## 2013-09-06 DIAGNOSIS — R0602 Shortness of breath: Secondary | ICD-10-CM | POA: Insufficient documentation

## 2013-09-06 DIAGNOSIS — R188 Other ascites: Secondary | ICD-10-CM | POA: Insufficient documentation

## 2013-09-06 DIAGNOSIS — Z860100 Personal history of colon polyps, unspecified: Secondary | ICD-10-CM | POA: Insufficient documentation

## 2013-09-06 DIAGNOSIS — Z87891 Personal history of nicotine dependence: Secondary | ICD-10-CM | POA: Insufficient documentation

## 2013-09-06 DIAGNOSIS — E669 Obesity, unspecified: Secondary | ICD-10-CM | POA: Insufficient documentation

## 2013-09-06 DIAGNOSIS — Z79899 Other long term (current) drug therapy: Secondary | ICD-10-CM | POA: Insufficient documentation

## 2013-09-06 DIAGNOSIS — I10 Essential (primary) hypertension: Secondary | ICD-10-CM | POA: Insufficient documentation

## 2013-09-06 DIAGNOSIS — E119 Type 2 diabetes mellitus without complications: Secondary | ICD-10-CM | POA: Insufficient documentation

## 2013-09-06 DIAGNOSIS — Z7982 Long term (current) use of aspirin: Secondary | ICD-10-CM | POA: Insufficient documentation

## 2013-09-06 DIAGNOSIS — Z87448 Personal history of other diseases of urinary system: Secondary | ICD-10-CM | POA: Insufficient documentation

## 2013-09-06 DIAGNOSIS — G4733 Obstructive sleep apnea (adult) (pediatric): Secondary | ICD-10-CM | POA: Insufficient documentation

## 2013-09-06 DIAGNOSIS — R0789 Other chest pain: Secondary | ICD-10-CM

## 2013-09-06 DIAGNOSIS — Z7902 Long term (current) use of antithrombotics/antiplatelets: Secondary | ICD-10-CM | POA: Insufficient documentation

## 2013-09-06 LAB — BASIC METABOLIC PANEL
BUN: 15 mg/dL (ref 6–23)
CO2: 21 mEq/L (ref 19–32)
Chloride: 106 mEq/L (ref 96–112)
GFR calc Af Amer: >90 mL/min (ref >90)
GFR calc non Af Amer: 81 mL/min — ABNORMAL LOW (ref >90)
Glucose, Bld: 120 mg/dL — ABNORMAL HIGH (ref 70–99)
Potassium: 4.1 mEq/L (ref 3.5–5.1)

## 2013-09-06 LAB — CBC
HCT: 42.4 % (ref 39.0–52.0)
Hemoglobin: 14.5 g/dL (ref 13.0–17.0)
MCH: 30.8 pg (ref 26.0–34.0)
MCHC: 34.2 g/dL (ref 30.0–36.0)
Platelets: 144 10*3/uL — ABNORMAL LOW (ref 150–400)
RBC: 4.71 MIL/uL (ref 4.22–5.81)

## 2013-09-06 MED ORDER — ASPIRIN 81 MG PO CHEW
324.0000 mg | CHEWABLE_TABLET | Freq: Once | ORAL | Status: AC
  Administered 2013-09-06: 324 mg via ORAL
  Filled 2013-09-06: qty 4

## 2013-09-06 MED ORDER — NITROGLYCERIN 0.4 MG SL SUBL
0.4000 mg | SUBLINGUAL_TABLET | SUBLINGUAL | Status: DC | PRN
  Administered 2013-09-06: 0.4 mg via SUBLINGUAL
  Filled 2013-09-06: qty 25

## 2013-09-06 NOTE — ED Notes (Signed)
Pt states that around 0930 chest felt like someone stabbed him along with not being able to take a deep breath, pt in no acute distress. Pt denies dizziness, n/v, blurred vision or any other symptoms.

## 2013-09-06 NOTE — ED Provider Notes (Signed)
CSN: [id number]     Arrival date & time 09/06/13  4098 History   First MD Initiated Contact with Patient 09/06/13 0944     No chief complaint on file.  (Consider location/radiation/quality/duration/timing/severity/associated sxs/prior Treatment) HPI Comments: 79 year old male with history of atrial fibrillation and an EF of 45% presents with left-sided sharp chest pain that started while he was in church. His pain started about 30 minutes prior to arrival. He's had some associated mild dyspnea as well. Denies any diaphoresis, nausea, or vomiting. Patient states he's never had pain like this before. Patient did not try any medicines for his pain. He states that nothing seems to make the pain better or worse. The pain was originally 7 but is now a 4/10 There are no pleuritic symptoms. Denies any cough or fever.  The history is provided by the patient.    Past Medical History  Diagnosis Date  . Atrial fibrillation Jan 2007    echo 1-07 normal ejection fraction, no significant valvular disease. adenosine cardiolite 1-07  no evidence of ischemia. A 48 hour Holter monitor in 7-07 showed good rate controlw/ chronic atrial fibrillation. Cath 1/11 normal cors. EF 45%. Echo 2-11 60-65%  . Obstructive sleep apnea     noncompliant with CPAP  . Cerebral aneurysm     followed by Dr Corliss Skains  . History of chest pain     Cardia catheterization 1999 was normal. myoview 2007 normal. cardiac cath jan 2011 normal cors EF 45%  . Obesity   . Hypertension   . Fatigue   . Allergy     rhinitis  . Hx of colonic polyps     diverticulosis  . Hyperlipidemia   . Retinal vein occlusion   . Benign prostatic hypertrophy   . IBS (irritable bowel syndrome)   . Diabetes mellitus     Elevated glucose/pre-dm  . Incontinence     Per pt 12/14/11   Past Surgical History  Procedure Laterality Date  . Cholecystectomy    . Inguinal and umbilical herniorrhaphy    . Tonsillectomy and adenoidectomy  as a child  . Cardiac  catheterization  1999  . Hernia repair     Family History  Problem Relation Age of Onset  . Cancer Father     lung  . Alcohol abuse Father   . Aneurysm Father     Aortic aneurysm  . Diabetes Son   . Cancer Maternal Grandmother     colon  . Diabetes Maternal Uncle   . Diabetes Paternal Uncle    History  Substance Use Topics  . Smoking status: Former Games developer  . Smokeless tobacco: Not on file     Comment: quit 1973  . Alcohol Use: Yes    Review of Systems  Constitutional: Negative for fever, chills and diaphoresis.  Respiratory: Positive for shortness of breath. Negative for cough.   Cardiovascular: Positive for chest pain. Negative for leg swelling.  Gastrointestinal: Negative for nausea, vomiting and abdominal pain.  All other systems reviewed and are negative.    Allergies  Clarithromycin; Epinephrine; and Nimodipine  Home Medications   Current Outpatient Rx  Name  Route  Sig  Dispense  Refill  . apixaban (ELIQUIS) 2.5 MG TABS tablet   Oral   Take 1 tablet (2.5 mg total) by mouth 2 (two) times daily.   60 tablet   6   . aspirin 81 MG tablet   Oral   Take 81 mg by mouth daily.         Marland Kitchen  azelastine (ASTELIN) 137 MCG/SPRAY nasal spray   Nasal   Place 1 spray into the nose 2 (two) times daily. Use in each nostril as directed   30 mL   12   . Calcium Carbonate-Vitamin D (CALCIUM + D) 600-200 MG-UNIT TABS   Oral   Take 1 tablet by mouth daily.          . cetirizine (ZYRTEC) 10 MG tablet   Oral   Take 10 mg by mouth daily as needed. allergies         . Mirabegron ER (MYRBETRIQ) 50 MG TB24   Oral   Take 50 mg by mouth daily.          . Multiple Vitamin (MULTIVITAMIN) tablet   Oral   Take 1 tablet by mouth daily.           . Omega-3 Fatty Acids (FISH OIL) 600 MG CAPS   Oral   Take 2 capsules by mouth daily.          BP 141/94  Pulse 89  Temp(Src) 97.6 F (36.4 C) (Oral)  Resp 20  SpO2 95% Physical Exam  Nursing note and vitals  reviewed. Constitutional: He is oriented to person, place, and time. He appears well-developed and well-nourished. No distress.  HENT:  Head: Normocephalic and atraumatic.  Right Ear: External ear normal.  Left Ear: External ear normal.  Nose: Nose normal.  Eyes: Right eye exhibits no discharge. Left eye exhibits no discharge.  Neck: Neck supple.  Cardiovascular: Normal rate, regular rhythm, normal heart sounds and intact distal pulses.   Pulmonary/Chest: Effort normal and breath sounds normal. He exhibits no tenderness.  Abdominal: Soft. He exhibits no distension. There is no tenderness.  Musculoskeletal: He exhibits no edema.  Neurological: He is alert and oriented to person, place, and time.  Skin: Skin is warm and dry.    ED Course  Procedures (including critical care time) Labs Review Labs Reviewed  CBC - Abnormal; Notable for the following:    Platelets 144 (*)    All other components within normal limits  BASIC METABOLIC PANEL - Abnormal; Notable for the following:    Glucose, Bld 120 (*)    GFR calc non Af Amer 81 (*)    All other components within normal limits  TROPONIN I  TROPONIN I   Imaging Review Dg Chest 2 View  09/06/2013   CLINICAL DATA:  Left-sided chest pain. Atrial fibrillation.  EXAM: CHEST  2 VIEW  COMPARISON:  07/13/2012  FINDINGS: Cardiomegaly is stable. Bibasilar scarring again noted. No evidence of acute infiltrate or edema. No evidence of pleural effusion. No mass or lymphadenopathy identified.  IMPRESSION: Stable cardiomegaly and bibasilar scarring. No active disease.   Electronically Signed   By: Myles Rosenthal   On: 09/06/2013 10:39     Date: 09/06/2013  Rate: 85  Rhythm: atrial fibrillation  QRS Axis: normal  Intervals: normal  ST/T Wave abnormalities: nonspecific T wave changes  Conduction Disutrbances:none  Narrative Interpretation:   Old EKG Reviewed: unchanged   MDM   1. Atypical chest pain    79 year old male with atypical left chest  pain. Patient's pain resolved with aspirin and nitroglycerin. He has no new EKG changes. I discussed the case with Dr. Donnie Aho who is covering for [facility] who states that since the pain is atypical and his EKG doesn't show any new ischemia that we get a 2nd troponin and if negative discharge him with followup tomorrow with his cardiologist.  Audree Camel, MD 09/06/13 [phone]

## 2013-09-10 ENCOUNTER — Ambulatory Visit (HOSPITAL_COMMUNITY)
Admission: RE | Admit: 2013-09-10 | Discharge: 2013-09-10 | Disposition: A | Discharge status: Home / Self Care | Payer: Medicare Other | Source: Ambulatory Visit | Attending: Internal Medicine | Admitting: Internal Medicine

## 2013-09-10 VITALS — BP 120/60 | HR 71 | Wt 197.8 lb

## 2013-09-10 DIAGNOSIS — I7771 Dissection of carotid artery: Secondary | ICD-10-CM

## 2013-09-10 DIAGNOSIS — R079 Chest pain, unspecified: Secondary | ICD-10-CM | POA: Insufficient documentation

## 2013-09-10 DIAGNOSIS — I2510 Atherosclerotic heart disease of native coronary artery without angina pectoris: Secondary | ICD-10-CM

## 2013-09-10 NOTE — Patient Instructions (Addendum)
We will contact you in 6 months to schedule your next appointment.  

## 2013-09-10 NOTE — Addendum Note (Signed)
Encounter addended by: Noralee Space, RN on: 09/10/2013  2:16 PM<BR>     Documentation filed: Patient Instructions Section

## 2013-09-10 NOTE — Progress Notes (Signed)
Patient ID: Maurice Wood, male   DOB: Nov 03, 1933, 79 y.o.   MRN: [id number] HPI:  Maurice Wood is a delightful 79 year old male with a history of chronic atrial fibrillation, obstructive sleep apnea, with noncompliance with CPAP. 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 artificially low due to AF). Echo  2/11: 60-65%  In the hospital in 8/13 for chest contusion after a fall and possible PNA.  Echo 8/13 - Left ventricle: The cavity size was normal. Wall thickness was increased in a pattern of mild LVH. Systolic function was vigorous. The estimated ejection fraction was in the range of 65% to 70%. Wall motion was normal; there were no regional wall motion abnormalities. - Aortic valve: Mild regurgitation. - Mitral valve: Mild to moderate regurgitation. - Left atrium: The atrium was mildly dilated. - Right atrium: The atrium was mildly dilated.  Evaluated at [facility] 09/06/13 with chest pain. CEs and ECG negative. (ECG with minimal nonspecific ST abnormality v4-v6)  Sharp pain on left side of his chest while at church. Lasted about 90 minutes and resolved. Received aspirin and nitroglycerin.   He returns for post ED evaluation. Denies SOB/PND/Orthopnea. No further CP. Back to normal activities. Denies hematuria or h/o kidney stones.     ROS: All systems negative except as listed in HPI, PMH and Problem List.  Past Medical History  Diagnosis Date  . Atrial fibrillation Jan 2007    echo 1-07 normal ejection fraction, no significant valvular disease. adenosine cardiolite 1-07  no evidence of ischemia. A 48 hour Holter monitor in 7-07 showed good rate controlw/ chronic atrial fibrillation. Cath 1/11 normal cors. EF 45%. Echo 2-11 60-65%  . Obstructive sleep apnea     noncompliant with CPAP  . Cerebral aneurysm    followed by Dr Corliss Skains  . History of chest pain     Cardia catheterization 1999 was normal. myoview 2007 normal. cardiac cath jan 2011 normal cors EF 45%  . Obesity   . Hypertension   . Fatigue   . Allergy     rhinitis  . Hx of colonic polyps     diverticulosis  . Hyperlipidemia   . Retinal vein occlusion   . Benign prostatic hypertrophy   . IBS (irritable bowel syndrome)   . Diabetes mellitus     Elevated glucose/pre-dm  . Incontinence     Per pt 12/14/11    Current Outpatient Prescriptions  Medication Sig Dispense Refill  . apixaban (ELIQUIS) 2.5 MG TABS tablet Take 1 tablet (2.5 mg total) by mouth 2 (two) times daily.  60 tablet  6  . aspirin EC 81 MG tablet Take 81 mg by mouth at bedtime.      Marland Kitchen azelastine (ASTELIN) 137 MCG/SPRAY nasal spray Place 1 spray into the nose 2 (two) times daily. Use in each nostril as directed  30 mL  12  . Calcium Carbonate-Vitamin D (CALCIUM + D) 600-200 MG-UNIT TABS Take 1 tablet by mouth daily.       . cetirizine (ZYRTEC) 10 MG tablet Take 10 mg by mouth daily as needed for allergies.       . cholestyramine (QUESTRAN) 4 G packet Take 1 packet by mouth daily.      . fish oil-omega-3 fatty acids 1000 MG capsule Take 2 g by mouth daily.      Marland Kitchen  Mirabegron ER (MYRBETRIQ) 50 MG TB24 Take 50 mg by mouth daily.       . Multiple Vitamin (MULTIVITAMIN WITH MINERALS) TABS tablet Take 1 tablet by mouth daily.       No current facility-administered medications for this encounter.     PHYSICAL EXAM: Filed Vitals:   09/10/13 1345  BP: 120/60  Pulse: 71    General:  Well appearing. No resp difficulty HEENT: normal Neck: supple. JVP flat. Carotids 2+ bilaterally; no bruits. No lymphadenopathy or thryomegaly appreciated. Cor: PMI normal. Irregular rate & rhythm. No rubs, gallops or murmurs. Lungs: clear minimal wheeze Abdomen: soft, nontender, nondistended. No hepatosplenomegaly. No bruits or masses. Good bowel sounds. Extremities: no cyanosis,  clubbing, rash, trace edema R>L Neuro: alert & orientedx3, cranial nerves grossly intact. Moves all 4 extremities w/o difficulty. Affect pleasant.   Lab Results  Component Value Date   CHOL 171 07/27/2013   HDL 47.30 07/27/2013   LDLCALC 88 07/27/2013   LDLDIRECT 97.4 11/09/2011   TRIG 181.0* 07/27/2013   CHOLHDL 4 07/27/2013    ASSESSMENT & PLAN:  1. Chest Pain  Doubt this is ischemic given previous cath and lack of recurrent symptoms. Will continue to monitor. If symptoms recur will need a stress test.    2. A-fib -  chronic  On eliquis 2.5 mg twice a day  3. HTN  Stable today   CLEGG,AMY 1:53 PM   Patient seen and examined with Tonye Becket, NP. We discussed all aspects of the encounter. I agree with the assessment and plan as stated above.  Agree with above. Doubt CP is ischemic. If further symptoms will need stress test.   Truman Hayward 2:13 PM

## 2013-10-13 ENCOUNTER — Other Ambulatory Visit: Payer: Self-pay | Admitting: Internal Medicine

## 2013-10-15 ENCOUNTER — Other Ambulatory Visit: Payer: Self-pay | Admitting: Internal Medicine

## 2013-12-02 ENCOUNTER — Ambulatory Visit (INDEPENDENT_AMBULATORY_CARE_PROVIDER_SITE_OTHER): Payer: Medicare Other | Admitting: Family Medicine

## 2013-12-02 VITALS — BP 116/76 | HR 92 | Temp 98.9°F | Resp 16 | Ht 68.0 in | Wt 193.0 lb

## 2013-12-02 DIAGNOSIS — R6889 Other general symptoms and signs: Secondary | ICD-10-CM

## 2013-12-02 DIAGNOSIS — R52 Pain, unspecified: Secondary | ICD-10-CM

## 2013-12-02 LAB — POCT INFLUENZA A/B
Influenza A, POC: NEGATIVE
Influenza B, POC: NEGATIVE

## 2013-12-02 MED ORDER — OSELTAMIVIR PHOSPHATE 75 MG PO CAPS: 75.0000 mg | ORAL_CAPSULE | Freq: Two times a day (BID) | ORAL | Status: DC

## 2013-12-02 NOTE — Patient Instructions (Signed)
Drink plenty of fluids  Take Tamiflu one twice daily  Get sufficient rest  Return if worse at any time  Over-the-counter symptomatic treatment for cough and congestion  Influenza A (H1N1) H1N1 formerly called "swine flu" is a new influenza virus causing sickness in people. The H1N1 virus is different from seasonal influenza viruses. However, the H1N1 symptoms are similar to seasonal influenza and it is spread from person to person. You may be at higher risk for serious problems if you have underlying serious medical conditions. The CDC and the [employer] are following reported cases around the world. CAUSES   The flu is thought to spread mainly person-to-person through coughing or sneezing of infected people.  A person may become infected by touching something with the virus on it and then touching their mouth or nose. SYMPTOMS   Fever.  Headache.  Tiredness.  Cough.  Sore throat.  Runny or stuffy nose.  Body aches.  Diarrhea and vomiting These symptoms are referred to as "flu-like symptoms." A lot of different illnesses, including the common cold, may have similar symptoms. DIAGNOSIS   There are tests that can tell if you have the H1N1 virus.  Confirmed cases of H1N1 will be reported to the state or local health department.  A doctor's exam may be needed to tell whether you have an infection that is a complication of the flu. HOME CARE INSTRUCTIONS   Stay informed. Visit the [facility] website for current recommendations. Visit [employer]. You may also call 1-800-CDC-INFO (425-622-6292).  Get help early if you develop any of the above symptoms.  If you are at high risk from complications of the flu, talk to your caregiver as soon as you develop flu-like symptoms. Those at higher risk for complications include:  People 65 years or older.  People with chronic medical conditions.  Pregnant women.  Young children.  Your caregiver may  recommend antiviral medicine to help treat the flu.  If you get the flu, get plenty of rest, drink enough water and fluids to keep your urine clear or pale yellow, and avoid using alcohol or tobacco.  You may take over-the-counter medicine to relieve the symptoms of the flu if your caregiver approves. (Never give aspirin to children or teenagers who have flu-like symptoms, particularly fever). TREATMENT  If you do get sick, antiviral drugs are available. These drugs can make your illness milder and make you feel better faster. Treatment should start soon after illness starts. It is only effective if taken within the first day of becoming ill. Only your caregiver can prescribe antiviral medication.  PREVENTION   Cover your nose and mouth with a tissue or your arm when you cough or sneeze. Throw the tissue away.  Wash your hands often with soap and warm water, especially after you cough or sneeze. Alcohol-based cleaners are also effective against germs.  Avoid touching your eyes, nose or mouth. This is one way germs spread.  Try to avoid contact with sick people. Follow public health advice regarding school closures. Avoid crowds.  Stay home if you get sick. Limit contact with others to keep from infecting them. People infected with the H1N1 virus may be able to infect others anywhere from 1 day before feeling sick to 5-7 days after getting flu symptoms.  An H1N1 vaccine is available to help protect against the virus. In addition to the H1N1 vaccine, you will need to be vaccinated for seasonal influenza. The H1N1 and seasonal vaccines may be given on  the same day. The CDC especially recommends the H1N1 vaccine for:  Pregnant women.  People who live with or care for children younger than 60 months of age.  Health care and emergency services personnel.  Persons between the ages of 59 months through 20 years of age.  People from ages 58 through 61 years who are at higher risk for H1N1 because  of chronic health disorders or immune system problems. FACEMASKS In community and home settings, the use of facemasks and N95 respirators are not normally recommended. In certain circumstances, a facemask or N95 respirator may be used for persons at increased risk of severe illness from influenza. Your caregiver can give additional recommendations for facemask use. IN CHILDREN, EMERGENCY WARNING SIGNS THAT NEED URGENT MEDICAL CARE:  Fast breathing or trouble breathing.  Bluish skin color.  Not drinking enough fluids.  Not waking up or not interacting normally.  Being so fussy that the child does not want to be held.  Your child has an oral temperature above 102 F (38.9 C), not controlled by medicine.  Your baby is older than 3 months with a rectal temperature of 102 F (38.9 C) or higher.  Your baby is 14 months old or younger with a rectal temperature of 100.4 F (38 C) or higher.  Flu-like symptoms improve but then return with fever and worse cough. IN ADULTS, EMERGENCY WARNING SIGNS THAT NEED URGENT MEDICAL CARE:  Difficulty breathing or shortness of breath.  Pain or pressure in the chest or abdomen.  Sudden dizziness.  Confusion.  Severe or persistent vomiting.  Bluish color.  You have a oral temperature above 102 F (38.9 C), not controlled by medicine.  Flu-like symptoms improve but return with fever and worse cough. SEEK IMMEDIATE MEDICAL CARE IF:  You or someone you know is experiencing any of the above symptoms. When you arrive at the emergency center, report that you think you have the flu. You may be asked to wear a mask and/or sit in a secluded area to protect others from getting sick. MAKE SURE YOU:   Understand these instructions.  Will watch your condition.  Will get help right away if you are not doing well or get worse. Some of this information courtesy of the CDC.  Document Released: 05/14/2008 Document Revised: 02/18/2012 Document Reviewed:  05/14/2008 [facility] Patient Information 2014 [place], [place].

## 2013-12-02 NOTE — Progress Notes (Signed)
Subjective: 79 year old retired gentleman who started getting sick the middle of the day yesterday. He developed body aches and headaches, slight sore throat, cough that is nonproductive, a little runny nose and sneezing. He did have his flu shot earlier this year. He took some Tylenol. He usually runs a low temperature. Knowing that he knows of has been ill, but he is exposed to other people at church and restaurants.  Objective: Pleasant gentleman in no major distress. Slight hacking cough. His TMs are normal. Throat essentially unremarkable. Nose not very congested. Neck supple without significant nodes. Chest is clear to auscultation. Heart regular with a grade 1/6 left sternal late soft systolic murmur  Assessment: Viral syndrome, suspicious for flu  Plan: Flu swab Results for orders placed in visit on 12/02/13  POCT INFLUENZA A/B      Result Value Range   Influenza A, POC Negative     Influenza B, POC Negative     Tamiflu option given and they decided on it  Flulike illness, suspicious for flu with negative test

## 2013-12-17 ENCOUNTER — Encounter: Payer: Self-pay | Admitting: Neurology

## 2013-12-17 ENCOUNTER — Ambulatory Visit (INDEPENDENT_AMBULATORY_CARE_PROVIDER_SITE_OTHER): Payer: Medicare Other | Admitting: Neurology

## 2013-12-17 VITALS — BP 126/91 | HR 73 | Ht 71.0 in | Wt 193.0 lb

## 2013-12-17 DIAGNOSIS — R2681 Unsteadiness on feet: Secondary | ICD-10-CM

## 2013-12-17 DIAGNOSIS — E119 Type 2 diabetes mellitus without complications: Secondary | ICD-10-CM

## 2013-12-17 DIAGNOSIS — M545 Low back pain, unspecified: Secondary | ICD-10-CM | POA: Insufficient documentation

## 2013-12-17 DIAGNOSIS — R269 Unspecified abnormalities of gait and mobility: Secondary | ICD-10-CM

## 2013-12-17 NOTE — Progress Notes (Signed)
Ms. Streight is a 80 years old right-handed Caucasian male, accompanied by his wife, followup for his gait difficulty, previous history of right internal carotid artery dissection, patient of Dr. love  He had a past medical history of peripheral neuropathy, chronic low back pain, atrial fibrillation, was previously taking Coumadin, in November 2013, was switched to Eiquis, his cardiologist is Dr. Baird Kay, his primary care physician is Dr. Wilfrid Lund  He was diagnosed with right internal carotid artery dissection in 2003, there was no focal symptoms, he had his first arteriogram November 2003 by Dr. Hermina Barters, repeat angiogram June 2004, showed decreased irregularity of the right internal carotid artery dissection, 5 mm right MCA  aneurysm, he was previously also followed by Dr. Mike Gip, later by Dr. Cyndy Freeze, most recent repeat MRI, MRA of the brain without contrast was in May 2011, there was no significant change, he had old infarction in the left cerebellum, chronic vascular disease,  Since 2009, he began to have bladder incontinence, occasionally bowel incontinence, especially when he suffered diarrhea, there was no etiology found, he also complains of bilateral feet paresthesia, EMG nerve conduction study in August 2012 demonstrated peripheral neuropathy, there was no evidence of demyelinating features   Laboratory evaluation showed normal CPK, B12, protein electrophoresis, TSH, , 24 hours during heavy-metal, elevated glucose 238, at one hour,  217 at 2 hours,  He has chronic low back pain, at his waistline, occasionally radiating to his bilateral lower extremity, since 2009, he also developed gradual onset gait difficulty, unsteadiness, he does not exercise regularly, he has a history of sleep apnea, but not using CPAP machine  Review of the system, is positive for fatigue, atrial fibrillation, hearing loss, easy bruising, easy bleeding, dizziness, decreased energy, obesity  UPDATE Jan 8th 2015: He is  overall doing very well, has some chronic low back pain across his back, occasionally shooting down his right leg, he is under the care of urologist, for his urinary urgency,  He denies significant gait difficulties, we have review of MRI of lumbar July 2014 together, there is multilevel degenerative disc disease, most severe at L3-4, L4-5, with multiple moderate foraminal stenosis right worse than left, there was no significant canal stenosis.  PHYSICAL EXAMINATOINS:  Generalized: In no acute distress  Neck: Supple, no carotid bruits   Cardiac: Regular rate rhythm  Pulmonary: Clear to auscultation bilaterally  Musculoskeletal: No deformity  Neurological examination  Mentation: Alert oriented to time, place, history taking, and causual conversation  Cranial nerve II-XII: Pupils were equal round reactive to light extraocular movements were full, visual field were full on confrontational test. facial sensation and strength were normal. hearing was intact to finger rubbing bilaterally. Uvula tongue midline.  head turning and shoulder shrug and were normal and symmetric.Tongue protrusion into cheek strength was normal.  Motor: he has mild to moderate bilateral hip flexion, knee extension weakness, there was no significant bilateral ankle dorsiflexion/ankle plantar flexion weakness.  Sensory: Length dependent decreased fine touch, pinprick, decreased vibratory sensation to mid shin level, and preserved proprioception at toes.  Coordination: Normal finger to nose, heel-to-shin bilaterally there was no truncal ataxia  Gait: Rising up from seated position by pushing on chair arm, he could not walk on tiptoe, or heels,   Romberg signs: mildly positive  Deep tendon reflexes: Brachioradialis 2/2, biceps 2/2, triceps 2/2, patellar 2/2, Achilles 0/0, plantar responses were flexor bilaterally.  Assessment and plan: 80 years old right-handed Caucasian male, with past medical history of chronic  low back  pain, also with past medical history of occasionally bowel bladder incontinence, MRI of the lumbar has demonstrated multilevel degenerative disc disease, most severe at L3-4, L4-5, with mild to moderate foraminal stenosis, right worse than left, no canal stenosis, is not a surgical candidate, there was no distal weakness found, I have encouraged moderate exercise, stretching exercise, return to clinic as needed   .

## 2013-12-20 ENCOUNTER — Ambulatory Visit (INDEPENDENT_AMBULATORY_CARE_PROVIDER_SITE_OTHER): Payer: Medicare Other | Admitting: Family Medicine

## 2013-12-20 VITALS — BP 100/66 | HR 83 | Temp 98.2°F | Resp 20 | Ht 69.0 in | Wt 193.2 lb

## 2013-12-20 DIAGNOSIS — J209 Acute bronchitis, unspecified: Secondary | ICD-10-CM

## 2013-12-20 MED ORDER — AZITHROMYCIN 250 MG PO TABS: ORAL_TABLET | ORAL | Status: DC

## 2013-12-20 MED ORDER — PREDNISONE 20 MG PO TABS: 40.0000 mg | ORAL_TABLET | Freq: Every day | ORAL | Status: DC

## 2013-12-20 MED ORDER — HYDROCODONE-HOMATROPINE 5-1.5 MG/5ML PO SYRP: 5.0000 mL | ORAL_SOLUTION | Freq: Three times a day (TID) | ORAL | Status: DC | PRN

## 2013-12-20 NOTE — Progress Notes (Signed)
Patient ID: Maurice Wood MRN: [id number], DOB: [birth year], 80 y.o. Date of Encounter: 12/20/2013, 9:17 AM  Primary Physician: Chancy Hurter, MD This chart was scribed for Maurice Haber, MD by Vernell Barrier, Medical Scribe. This patient's care was started at 9:17 AM  Chief Complaint: otalgia  HPI: 80 y.o. year old male with history below presents with left ear pain and mild cough. Pt states he feels like he has fluid in his ear. Pt was seen by Dr. Linna Darner on December 02, 2013 and tested negative for the flu. Pt denies trouble sleeping due to cough. Pt has several allergies to medication as displayed below.  Pt states he developed an ear ache at 80 years old and the doctor punctured his left ear drum leaving a scar. Pt over the years has lost a lot of hearing in his right ear. States his father is almost completely deaf in his right ear.  Pt has taken a Z-Pak in the past. Pt received a flu shot last year. Pt is married with 2 children. Pt is retired.   Past Medical History  Diagnosis Date   Atrial fibrillation Jan 2007    echo 1-07 normal ejection fraction, no significant valvular disease. adenosine cardiolite 1-07  no evidence of ischemia. A 48 hour Holter monitor in 7-07 showed good rate controlw/ chronic atrial fibrillation. Cath 1/11 normal cors. EF 45%. Echo 2-11 60-65%   Obstructive sleep apnea     noncompliant with CPAP   Cerebral aneurysm     followed by Dr Estanislado Pandy   History of chest pain     Cardia catheterization 1999 was normal. myoview 2007 normal. cardiac cath jan 2011 normal cors EF 45%   Obesity    Hypertension    Fatigue    Allergy     rhinitis   Hx of colonic polyps     diverticulosis   Hyperlipidemia    Retinal vein occlusion    Benign prostatic hypertrophy    IBS (irritable bowel syndrome)    Diabetes mellitus     Elevated glucose/pre-dm   Incontinence     Per pt 12/14/11     Home Meds: Prior to Admission medications   Medication Sig  Start Date End Date Taking? Authorizing Provider  aspirin EC 81 MG tablet Take 81 mg by mouth at bedtime.   Yes Historical Provider, MD  azelastine (ASTELIN) 137 MCG/SPRAY nasal spray Place 1 spray into the nose 2 (two) times daily. Use in each nostril as directed 07/27/13  Yes Bruce Kendall Flack, MD  Calcium Carbonate-Vitamin D (CALCIUM + D) 600-200 MG-UNIT TABS Take 1 tablet by mouth daily.    Yes Historical Provider, MD  cetirizine (ZYRTEC) 10 MG tablet Take 10 mg by mouth daily as needed for allergies.    Yes Historical Provider, MD  cholestyramine Lucrezia Starch) 4 G packet Take 1 packet by mouth daily.   Yes Historical Provider, MD  ELIQUIS 2.5 MG TABS tablet TAKE 1 TABLET (2.5 MG TOTAL) BY MOUTH 2 (TWO) TIMES DAILY. 10/15/13  Yes Jolaine Artist, MD  fish oil-omega-3 fatty acids 1000 MG capsule Take 2 g by mouth daily.   Yes Historical Provider, MD  fluticasone (FLONASE) 50 MCG/ACT nasal spray Place into both nostrils daily.   Yes Historical Provider, MD  Mirabegron ER (MYRBETRIQ) 50 MG TB24 Take 50 mg by mouth daily.    Yes Historical Provider, MD  Multiple Vitamin (MULTIVITAMIN WITH MINERALS) TABS tablet Take 1 tablet by mouth daily.    Historical Provider,  MD    Allergies:  Allergies  Allergen Reactions   Clarithromycin     REACTION: Headache   Epinephrine     REACTION: Increased Heart Rate   Nimodipine     REACTION: Flushing    History   Social History   Marital Status: Married    Spouse Name: [name]    Number of Children: 2   Years of Education: college   Occupational History        retired   Social History Main Topics   Smoking status: Former Smoker    Types: Cigarettes   Smokeless tobacco: Never Used     Comment: quit 1973   Alcohol Use: 0.6 oz/week    1 Cans of beer per week   Drug Use: No   Sexual Activity: Yes    Museum/gallery curator: None   Other Topics Concern   Not on file   Social History Narrative   Patient lives at home with  his wife [name])   Retired   Scientist, physiological- college   Right handed.   Caffeine-  Two cups coffee daily.     Review of Systems: Constitutional: negative for chills, fever, night sweats, weight changes, or fatigue  HEENT: negative for vision changes, congestion, rhinorrhea, ST, epistaxis, or sinus pressure, positive for otalgia, progressive right ear hearing loss Cardiovascular: negative for chest pain or palpitations Respiratory: negative for hemoptysis, wheezing, shortness of breath, positive for mild cough Abdominal: negative for abdominal pain, nausea, vomiting, diarrhea, or constipation Dermatological: negative for rash Neurologic: negative for headache, dizziness, or syncope All other systems reviewed and are otherwise negative with the exception to those above and in the HPI.   Physical Exam: Blood pressure 100/66, pulse 83, temperature 98.2 F (36.8 C), temperature source Oral, resp. rate 20, height 5\' 9"  (1.753 m), weight 193 lb 3.2 oz (87.635 kg), SpO2 96.00%., Body mass index is 28.52 kg/(m^2). General: Well developed, well nourished, in no acute distress. Head: Normocephalic, atraumatic, eyes without discharge, sclera non-icteric, nares are without discharge. Bilateral auditory canals clear, TM's are without perforation, pearly grey and translucent with reflective cone of light bilaterally. Oral cavity moist, posterior pharynx without exudate, erythema, peritonsillar abscess, or post nasal drip.  Neck: Supple. No thyromegaly. Full ROM. No lymphadenopathy. Lungs: Clear bilaterally to auscultation without wheezes, rales, or rhonchi. Breathing is unlabored. Heart: RRR with S1 S2. No murmurs, rubs, or gallops appreciated. Abdomen: Soft, non-tender, non-distended with normoactive bowel sounds. No hepatomegaly. No rebound/guarding. No obvious abdominal masses. Msk:  Strength and tone normal for age. Extremities/Skin: Warm and dry. No clubbing or cyanosis. No edema. No rashes or  suspicious lesions. Neuro: Alert and oriented X 3. Moves all extremities spontaneously. Gait is normal. CNII-XII grossly in tact. Psych:  Responds to questions appropriately with a normal affect.   Labs:   ASSESSMENT AND PLAN:  80 y.o. year old male with left ear pain and cough.  Acute bronchitis - Plan: azithromycin (ZITHROMAX Z-PAK) 250 MG tablet, predniSONE (DELTASONE) 20 MG tablet, HYDROcodone-homatropine (HYCODAN) 5-1.5 MG/5ML syrup     Signed, Maurice Haber, MD 12/20/2013 9:17 AM    HPI Review of Systems  Physical Exam

## 2013-12-20 NOTE — Patient Instructions (Signed)
Serous Otitis Media  Serous otitis media is fluid in the middle ear space. This space contains the bones for hearing and air. Air in the middle ear space helps to transmit sound.  The air gets there through the eustachian tube. This tube goes from the back of the nose (nasopharynx) to the middle ear space. It keeps the pressure in the middle ear the same as the outside world. It also helps to drain fluid from the middle ear space. CAUSES  Serous otitis media occurs when the eustachian tube gets blocked. Blockage can come from:  Ear infections.  Colds and other upper respiratory infections.  Allergies.  Irritants such as cigarette smoke.  Sudden changes in air pressure (such as descending in an airplane).  Enlarged adenoids.  A mass in the nasopharynx. During colds and upper respiratory infections, the middle ear space can become temporarily filled with fluid. This can happen after an ear infection also. Once the infection clears, the fluid will generally drain out of the ear through the eustachian tube. If it does not, then serous otitis media occurs. SIGNS AND SYMPTOMS   Hearing loss.  A feeling of fullness in the ear, without pain.  Young children may not show any symptoms but may show slight behavioral changes, such as agitation, ear pulling, or crying. DIAGNOSIS  Serous otitis media is diagnosed by an ear exam. Tests may be done to check on the movement of the eardrum. Hearing exams may also be done. TREATMENT  The fluid most often goes away without treatment. If allergy is the cause, allergy treatment may be helpful. Fluid that persists for several months may require minor surgery. A small tube is placed in the eardrum to:  Drain the fluid.  Restore the air in the middle ear space. In certain situations, antibiotics are used to avoid surgery. Surgery may be done to remove enlarged adenoids (if this is the cause). HOME CARE INSTRUCTIONS   Keep children away from tobacco  smoke.  Be sure to keep any follow-up appointments. SEEK MEDICAL CARE IF:   Your hearing is not better in 3 months.  Your hearing is worse.  You have ear pain.  You have drainage from the ear.  You have dizziness.  You have serous otitis media only in one ear or have any bleeding from your nose (epistaxis).  You notice a lump on your neck. MAKE SURE YOU:  Understand these instructions.   Will watch your condition.   Will get help right away if you are not doing well or get worse.  Document Released: 02/16/2004 Document Revised: 07/29/2013 Document Reviewed: 06/23/2013 [facility] Patient Information 2014 [place], [place]. Bronchitis Bronchitis is inflammation of the airways that extend from the windpipe into the lungs (bronchi). The inflammation often causes mucus to develop, which leads to a cough. If the inflammation becomes severe, it may cause shortness of breath. CAUSES  Bronchitis may be caused by:   Viral infections.   Bacteria.   Cigarette smoke.   Allergens, pollutants, and other irritants.  SIGNS AND SYMPTOMS  The most common symptom of bronchitis is a frequent cough that produces mucus. Other symptoms include:  Fever.   Body aches.   Chest congestion.   Chills.   Shortness of breath.   Sore throat.  DIAGNOSIS  Bronchitis is usually diagnosed through a medical history and physical exam. Tests, such as chest X-rays, are sometimes done to rule out other conditions.  TREATMENT  You may need to avoid contact with whatever caused  the problem (smoking, for example). Medicines are sometimes needed. These may include:  Antibiotics. These may be prescribed if the condition is caused by bacteria.  Cough suppressants. These may be prescribed for relief of cough symptoms.   Inhaled medicines. These may be prescribed to help open your airways and make it easier for you to breathe.   Steroid medicines. These may be prescribed for those with  recurrent (chronic) bronchitis. HOME CARE INSTRUCTIONS  Get plenty of rest.   Drink enough fluids to keep your urine clear or pale yellow (unless you have a medical condition that requires fluid restriction). Increasing fluids may help thin your secretions and will prevent dehydration.   Only take over-the-counter or prescription medicines as directed by your health care provider.  Only take antibiotics as directed. Make sure you finish them even if you start to feel better.  Avoid secondhand smoke, irritating chemicals, and strong fumes. These will make bronchitis worse. If you are a smoker, quit smoking. Consider using nicotine gum or skin patches to help control withdrawal symptoms. Quitting smoking will help your lungs heal faster.   Put a cool-mist humidifier in your bedroom at night to moisten the air. This may help loosen mucus. Change the water in the humidifier daily. You can also run the hot water in your shower and sit in the bathroom with the door closed for 5 10 minutes.   Follow up with your health care provider as directed.   Wash your hands frequently to avoid catching bronchitis again or spreading an infection to others.  SEEK MEDICAL CARE IF: Your symptoms do not improve after 1 week of treatment.  SEEK IMMEDIATE MEDICAL CARE IF:  Your fever increases.  You have chills.   You have chest pain.   You have worsening shortness of breath.   You have bloody sputum.  You faint.  You have lightheadedness.  You have a severe headache.   You vomit repeatedly. MAKE SURE YOU:   Understand these instructions.  Will watch your condition.  Will get help right away if you are not doing well or get worse. Document Released: 11/26/2005 Document Revised: 09/16/2013 Document Reviewed: 07/21/2013 [facility] Patient Information 2014 [place].

## 2013-12-30 ENCOUNTER — Ambulatory Visit (INDEPENDENT_AMBULATORY_CARE_PROVIDER_SITE_OTHER): Payer: Medicare Other | Admitting: Family Medicine

## 2013-12-30 ENCOUNTER — Encounter: Payer: Self-pay | Admitting: Family Medicine

## 2013-12-30 ENCOUNTER — Telehealth: Payer: Self-pay | Admitting: Internal Medicine

## 2013-12-30 VITALS — BP 108/60 | HR 80 | Temp 98.6°F | Ht 69.0 in | Wt 196.0 lb

## 2013-12-30 DIAGNOSIS — J019 Acute sinusitis, unspecified: Secondary | ICD-10-CM

## 2013-12-30 MED ORDER — AMOXICILLIN-POT CLAVULANATE 875-125 MG PO TABS: 1.0000 | ORAL_TABLET | Freq: Two times a day (BID) | ORAL | Status: DC

## 2013-12-30 NOTE — Telephone Encounter (Signed)
Patient Information:  Caller Name: Stanton Kidney  Phone: [phone]  Patient: Maurice Wood  Gender: Male  DOB: [birth year]  Age: 80 Years  PCP: Phoebe Sharps (Adults only)  Office Follow Up:  Does the office need to follow up with this patient?: No  Instructions For The Office: N/A   Symptoms  Reason For Call & Symptoms: L ear pain a wk ago and was seen in [facility] and told he had fluid in ear and prescribed a  Zpak and prednisone and L ear now resolved but last night began having R ear pain. No fever or any other sxs.  Reviewed Health History In EMR: Yes  Reviewed Medications In EMR: Yes  Reviewed Allergies In EMR: Yes  Reviewed Surgeries / Procedures: Yes  Date of Onset of Symptoms: 12/29/2013  Guideline(s) Used:  Earache  Disposition Per Guideline:   See Today in Office  Reason For Disposition Reached:   All other earaches (Exceptions: earache lasting < 1 hour, and earache from air travel)  Advice Given:  Pain Medicines:  For pain relief, you can take either acetaminophen, ibuprofen, or naproxen.  They are over-the-counter (OTC) pain drugs. You can buy them at the drugstore.  Apply Cold to the Area for Pain:  Apply a cold pack or a cold wet washcloth to the outer ear for 20 minutes to reduce pain while the pain medicine takes effect (Note: Some individuals prefer local heat instead of cold for 20 minutes).  Call Back If  High fever, severe headache, or stiff neck occurs  You become worse.  Patient Will Follow Care Advice:  YES  Appointment Scheduled:  12/30/2013 15:00:00 Appointment Scheduled Provider:  Alysia Penna [facility])

## 2013-12-30 NOTE — Progress Notes (Signed)
Pre visit review using our clinic review tool, if applicable. No additional management support is needed unless otherwise documented below in the visit note. 

## 2013-12-31 ENCOUNTER — Encounter: Payer: Self-pay | Admitting: Family Medicine

## 2013-12-31 NOTE — Progress Notes (Signed)
   Subjective:    Patient ID: Maurice Wood, male    DOB: February 12, 1933, 80 y.o.   MRN: [id number]  HPI Here for 3 weeks of sinus pressure and pain in the right ear. He took a Zpack 2 weeks ago with no benefit.    Review of Systems  Constitutional: Negative.   HENT: Positive for congestion, postnasal drip and sinus pressure.   Eyes: Negative.   Respiratory: Positive for cough.        Objective:   Physical Exam  Constitutional: He appears well-developed and well-nourished.  HENT:  Right Ear: External ear normal.  Left Ear: External ear normal.  Nose: Nose normal.  Mouth/Throat: Oropharynx is clear and moist.  Eyes: Conjunctivae are normal.  Pulmonary/Chest: Effort normal and breath sounds normal.  Lymphadenopathy:    He has no cervical adenopathy.          Assessment & Plan:  Add Mucinex

## 2014-01-27 ENCOUNTER — Ambulatory Visit: Payer: Medicare Other | Admitting: Internal Medicine

## 2014-02-05 ENCOUNTER — Other Ambulatory Visit: Payer: Self-pay | Admitting: Gastroenterology

## 2014-02-17 ENCOUNTER — Ambulatory Visit: Payer: Medicare Other | Admitting: Internal Medicine

## 2014-02-24 ENCOUNTER — Encounter: Payer: Self-pay | Admitting: Internal Medicine

## 2014-02-24 ENCOUNTER — Ambulatory Visit (INDEPENDENT_AMBULATORY_CARE_PROVIDER_SITE_OTHER): Payer: Medicare Other | Admitting: Internal Medicine

## 2014-02-24 VITALS — BP 110/74 | HR 72 | Temp 97.6°F | Ht 69.0 in | Wt 194.0 lb

## 2014-02-24 DIAGNOSIS — E119 Type 2 diabetes mellitus without complications: Secondary | ICD-10-CM

## 2014-02-24 DIAGNOSIS — E1159 Type 2 diabetes mellitus with other circulatory complications: Secondary | ICD-10-CM

## 2014-02-24 DIAGNOSIS — I4891 Unspecified atrial fibrillation: Secondary | ICD-10-CM

## 2014-02-24 LAB — BASIC METABOLIC PANEL
BUN: 16 mg/dL (ref 6–23)
CHLORIDE: 109 meq/L (ref 96–112)
CO2: 28 meq/L (ref 19–32)
CREATININE: 1.1 mg/dL (ref 0.4–1.5)
Calcium: 9.6 mg/dL (ref 8.4–10.5)
GFR: 67.31 mL/min (ref >60.00)
Glucose, Bld: 127 mg/dL — ABNORMAL HIGH (ref 70–99)
Potassium: 5.2 mEq/L — ABNORMAL HIGH (ref 3.5–5.1)
Sodium: 142 mEq/L (ref 135–145)

## 2014-02-24 LAB — HEPATIC FUNCTION PANEL
ALBUMIN: 3.7 g/dL (ref 3.5–5.2)
ALT: 15 U/L (ref 0–53)
AST: 20 U/L (ref 0–37)
Alkaline Phosphatase: 64 U/L (ref 39–117)
Bilirubin, Direct: 0.1 mg/dL (ref 0.0–0.3)
Total Bilirubin: 1 mg/dL (ref 0.3–1.2)
Total Protein: 6.8 g/dL (ref 6.0–8.3)

## 2014-02-24 LAB — LIPID PANEL
CHOL/HDL RATIO: 3
Cholesterol: 166 mg/dL (ref 0–200)
HDL: 53.8 mg/dL (ref >39.00)
LDL CALC: 84 mg/dL (ref 0–99)
Triglycerides: 143 mg/dL (ref 0.0–149.0)
VLDL: 28.6 mg/dL (ref 0.0–40.0)

## 2014-02-24 LAB — MICROALBUMIN / CREATININE URINE RATIO
CREATININE, U: 193.1 mg/dL
MICROALB/CREAT RATIO: 0.6 mg/g (ref 0.0–30.0)
Microalb, Ur: 1.2 mg/dL (ref 0.0–1.9)

## 2014-02-24 LAB — HEMOGLOBIN A1C: Hgb A1c MFr Bld: 5.8 % (ref 4.6–6.5)

## 2014-02-24 NOTE — Progress Notes (Signed)
DM2- currently no meds  AFIB- on eliquis without bleeding complications  Nasal congestion- controlled on ipratroprium  Past Medical History  Diagnosis Date  . Atrial fibrillation Jan 2007    echo 1-07 normal ejection fraction, no significant valvular disease. adenosine cardiolite 1-07  no evidence of ischemia. A 48 hour Holter monitor in 7-07 showed good rate controlw/ chronic atrial fibrillation. Cath 1/11 normal cors. EF 45%. Echo 2-11 60-65%  . Obstructive sleep apnea     noncompliant with CPAP  . Cerebral aneurysm     followed by Dr Estanislado Pandy  . History of chest pain     Cardia catheterization 1999 was normal. myoview 2007 normal. cardiac cath jan 2011 normal cors EF 45%  . Obesity   . Hypertension   . Fatigue   . Allergy     rhinitis  . Hx of colonic polyps     diverticulosis  . Hyperlipidemia   . Retinal vein occlusion   . Benign prostatic hypertrophy   . IBS (irritable bowel syndrome)   . Diabetes mellitus     Elevated glucose/pre-dm  . Incontinence     Per pt 12/14/11    History   Social History  . Marital Status: Married    Spouse Name: [name]    Number of Children: 2  . Years of Education: college   Occupational History  .      retired   Social History Main Topics  . Smoking status: Former Smoker    Types: Cigarettes  . Smokeless tobacco: Never Used     Comment: quit 1973  . Alcohol Use: 0.6 oz/week    1 Cans of beer per week     Comment: occ  . Drug Use: No  . Sexual Activity: Yes    Birth Control/ Protection: None   Other Topics Concern  . Not on file   Social History Narrative   Patient lives at home with his wife [name])   Retired   Scientist, physiological- college   Right handed.   Caffeine-  Two cups coffee daily.    Past Surgical History  Procedure Laterality Date  . Cholecystectomy    . Inguinal and umbilical herniorrhaphy    . Tonsillectomy and adenoidectomy  as a child  . Cardiac catheterization  1999  . Hernia repair       Family History  Problem Relation Age of Onset  . Cancer Father     lung  . Alcohol abuse Father   . Aneurysm Father     Aortic aneurysm  . Diabetes Son   . Cancer Maternal Grandmother     colon  . Diabetes Maternal Uncle   . Diabetes Paternal Uncle     Allergies  Allergen Reactions  . Clarithromycin     REACTION: Headache  . Epinephrine     REACTION: Increased Heart Rate  . Nimodipine     REACTION: Flushing    Current Outpatient Prescriptions on File Prior to Visit  Medication Sig Dispense Refill  . aspirin EC 81 MG tablet Take 81 mg by mouth at bedtime.      . Calcium Carbonate-Vitamin D (CALCIUM + D) 600-200 MG-UNIT TABS Take 1 tablet by mouth daily.       . cholestyramine (QUESTRAN) 4 G packet Take 1 packet by mouth daily.      Marland Kitchen ELIQUIS 2.5 MG TABS tablet TAKE 1 TABLET (2.5 MG TOTAL) BY MOUTH 2 (TWO) TIMES DAILY.  60 tablet  6  . fish oil-omega-3  fatty acids 1000 MG capsule Take 2 g by mouth daily.      . Multiple Vitamin (MULTIVITAMIN WITH MINERALS) TABS tablet Take 1 tablet by mouth daily.       No current facility-administered medications on file prior to visit.     patient denies chest pain, shortness of breath, orthopnea. Denies lower extremity edema, abdominal pain, change in appetite, change in bowel movements. Patient denies rashes, musculoskeletal complaints. No other specific complaints in a complete review of systems.   BP 110/74  Pulse 72  Temp(Src) 97.6 F (36.4 C) (Oral)  Ht 5\' 9"  (1.753 m)  Wt 194 lb (87.998 kg)  BMI 28.64 kg/m2  well-developed well-nourished male in no acute distress. HEENT exam atraumatic, normocephalic, neck supple without jugular venous distention. Chest clear to auscultation cardiac exam S1-S2 are irregular. Abdominal exam overweight with bowel sounds, soft and nontender. Extremities no edema. Neurologic exam is alert with a normal gait.  Atrial fibrillation TOLerating apixaban Continue same  DM type 2 (diabetes  mellitus, type 2) Not requiring any medications

## 2014-02-24 NOTE — Progress Notes (Signed)
Pre visit review using our clinic review tool, if applicable. No additional management support is needed unless otherwise documented below in the visit note. 

## 2014-02-27 ENCOUNTER — Encounter (HOSPITAL_COMMUNITY): Payer: Self-pay | Admitting: Emergency Medicine

## 2014-02-27 ENCOUNTER — Emergency Department (HOSPITAL_COMMUNITY): Payer: Medicare Other

## 2014-02-27 ENCOUNTER — Emergency Department (HOSPITAL_COMMUNITY)
Admission: EM | Admit: 2014-02-27 | Discharge: 2014-02-27 | Disposition: A | Discharge status: Home / Self Care | Payer: Medicare Other | Attending: Emergency Medicine | Admitting: Emergency Medicine

## 2014-02-27 DIAGNOSIS — Z87891 Personal history of nicotine dependence: Secondary | ICD-10-CM | POA: Insufficient documentation

## 2014-02-27 DIAGNOSIS — Z7982 Long term (current) use of aspirin: Secondary | ICD-10-CM | POA: Insufficient documentation

## 2014-02-27 DIAGNOSIS — R0789 Other chest pain: Secondary | ICD-10-CM | POA: Insufficient documentation

## 2014-02-27 DIAGNOSIS — K589 Irritable bowel syndrome without diarrhea: Secondary | ICD-10-CM | POA: Insufficient documentation

## 2014-02-27 DIAGNOSIS — B349 Viral infection, unspecified: Secondary | ICD-10-CM

## 2014-02-27 DIAGNOSIS — R059 Cough, unspecified: Secondary | ICD-10-CM | POA: Insufficient documentation

## 2014-02-27 DIAGNOSIS — R05 Cough: Secondary | ICD-10-CM | POA: Insufficient documentation

## 2014-02-27 DIAGNOSIS — E785 Hyperlipidemia, unspecified: Secondary | ICD-10-CM | POA: Insufficient documentation

## 2014-02-27 DIAGNOSIS — N400 Enlarged prostate without lower urinary tract symptoms: Secondary | ICD-10-CM | POA: Insufficient documentation

## 2014-02-27 DIAGNOSIS — R0989 Other specified symptoms and signs involving the circulatory and respiratory systems: Secondary | ICD-10-CM | POA: Insufficient documentation

## 2014-02-27 DIAGNOSIS — G4733 Obstructive sleep apnea (adult) (pediatric): Secondary | ICD-10-CM | POA: Insufficient documentation

## 2014-02-27 DIAGNOSIS — H9209 Otalgia, unspecified ear: Secondary | ICD-10-CM | POA: Insufficient documentation

## 2014-02-27 DIAGNOSIS — Z7901 Long term (current) use of anticoagulants: Secondary | ICD-10-CM | POA: Insufficient documentation

## 2014-02-27 DIAGNOSIS — B9789 Other viral agents as the cause of diseases classified elsewhere: Secondary | ICD-10-CM | POA: Insufficient documentation

## 2014-02-27 DIAGNOSIS — I4891 Unspecified atrial fibrillation: Secondary | ICD-10-CM | POA: Insufficient documentation

## 2014-02-27 DIAGNOSIS — Z86718 Personal history of other venous thrombosis and embolism: Secondary | ICD-10-CM | POA: Insufficient documentation

## 2014-02-27 DIAGNOSIS — J3489 Other specified disorders of nose and nasal sinuses: Secondary | ICD-10-CM | POA: Insufficient documentation

## 2014-02-27 DIAGNOSIS — I10 Essential (primary) hypertension: Secondary | ICD-10-CM | POA: Insufficient documentation

## 2014-02-27 DIAGNOSIS — E119 Type 2 diabetes mellitus without complications: Secondary | ICD-10-CM | POA: Insufficient documentation

## 2014-02-27 DIAGNOSIS — R509 Fever, unspecified: Secondary | ICD-10-CM | POA: Insufficient documentation

## 2014-02-27 LAB — RAPID STREP SCREEN (MED CTR MEBANE ONLY): STREPTOCOCCUS, GROUP A SCREEN (DIRECT): NEGATIVE

## 2014-02-27 MED ORDER — LEVOFLOXACIN 500 MG PO TABS: 500.0000 mg | ORAL_TABLET | Freq: Every day | ORAL | Status: DC

## 2014-02-27 MED ORDER — BENZONATATE 100 MG PO CAPS: 100.0000 mg | ORAL_CAPSULE | Freq: Three times a day (TID) | ORAL | Status: DC

## 2014-02-27 MED ORDER — OSELTAMIVIR PHOSPHATE 75 MG PO CAPS: 75.0000 mg | ORAL_CAPSULE | Freq: Two times a day (BID) | ORAL | Status: DC

## 2014-02-27 NOTE — ED Notes (Signed)
Pt states c/o sore throat and fever since Thursday.

## 2014-02-27 NOTE — ED Notes (Signed)
Pt to CXR.

## 2014-02-27 NOTE — ED Provider Notes (Signed)
CSN: [id number]     Arrival date & time 02/27/14  1735 History   First MD Initiated Contact with Patient 02/27/14 1744     Chief Complaint  Patient presents with  . Sore Throat  . Fever     (Consider location/radiation/quality/duration/timing/severity/associated sxs/prior Treatment) HPI  80 year old male with history of A. fib, currently on Eliquis, non-insulin dependent diabetes, obesity, obstructive sleep apnea presents complaining of fever and sore throat. Patient reports for the past 3 days he has been experiencing a constellation of URI symptoms including fever as high as 101, nasal congestions, sneezing, ear discomfort, sore throat, dry cough, and pleuritic chest pain. Symptom is getting progressively worse. He feels weak today. He endorsed chills. Denies any significant headache, productive cough, hemoptysis, shortness of breath, dyspnea of exertion, abdominal pain, vomiting and diarrhea, dysuria, or rash. She has a history of pneumonia in the past and his family was concerned. Denies any symptoms concerning for ACS including no diaphoresis, shortness of breath, lightheadedness or dizziness. He did take tylenol today for his fever.    Past Medical History  Diagnosis Date  . Atrial fibrillation Jan 2007    echo 1-07 normal ejection fraction, no significant valvular disease. adenosine cardiolite 1-07  no evidence of ischemia. A 48 hour Holter monitor in 7-07 showed good rate controlw/ chronic atrial fibrillation. Cath 1/11 normal cors. EF 45%. Echo 2-11 60-65%  . Obstructive sleep apnea     noncompliant with CPAP  . Cerebral aneurysm     followed by Dr Estanislado Pandy  . History of chest pain     Cardia catheterization 1999 was normal. myoview 2007 normal. cardiac cath jan 2011 normal cors EF 45%  . Obesity   . Hypertension   . Fatigue   . Allergy     rhinitis  . Hx of colonic polyps     diverticulosis  . Hyperlipidemia   . Retinal vein occlusion   . Benign prostatic hypertrophy   .  IBS (irritable bowel syndrome)   . Diabetes mellitus     Elevated glucose/pre-dm  . Incontinence     Per pt 12/14/11   Past Surgical History  Procedure Laterality Date  . Cholecystectomy    . Inguinal and umbilical herniorrhaphy    . Tonsillectomy and adenoidectomy  as a child  . Cardiac catheterization  1999  . Hernia repair     Family History  Problem Relation Age of Onset  . Cancer Father     lung  . Alcohol abuse Father   . Aneurysm Father     Aortic aneurysm  . Diabetes Son   . Cancer Maternal Grandmother     colon  . Diabetes Maternal Uncle   . Diabetes Paternal Uncle    History  Substance Use Topics  . Smoking status: Former Smoker    Types: Cigarettes  . Smokeless tobacco: Never Used     Comment: quit 1973  . Alcohol Use: 0.6 oz/week    1 Cans of beer per week     Comment: occ    Review of Systems  All other systems reviewed and are negative.      Allergies  Clarithromycin; Epinephrine; and Nimodipine  Home Medications   Current Outpatient Rx  Name  Route  Sig  Dispense  Refill  . aspirin EC 81 MG tablet   Oral   Take 81 mg by mouth at bedtime.         . Calcium Carbonate-Vitamin D (CALCIUM + D) 600-200 MG-UNIT TABS  Oral   Take 1 tablet by mouth daily.          . cholestyramine (QUESTRAN) 4 G packet   Oral   Take 1 packet by mouth daily.         Marland Kitchen ELIQUIS 2.5 MG TABS tablet      TAKE 1 TABLET (2.5 MG TOTAL) BY MOUTH 2 (TWO) TIMES DAILY.   60 tablet   6   . fish oil-omega-3 fatty acids 1000 MG capsule   Oral   Take 2 g by mouth daily.         Marland Kitchen ipratropium (ATROVENT) 0.03 % nasal spray   Each Nare   Place 2 sprays into both nostrils every 12 (twelve) hours.         . Multiple Vitamin (MULTIVITAMIN WITH MINERALS) TABS tablet   Oral   Take 1 tablet by mouth daily.          BP 114/76  Pulse 95  Temp(Src) 99.2 F (37.3 C) (Oral)  Resp 16  SpO2 93% Physical Exam  Nursing note and vitals reviewed. Constitutional:  He appears well-developed and well-nourished. No distress.  Awake, alert, nontoxic appearance  HENT:  Head: Atraumatic.  Right Ear: External ear normal.  Left Ear: External ear normal.  Nose: Nose normal.  Mouth/Throat: Oropharynx is clear and moist.  Throat: uvula midline, no tonsillar enlargement or exudates, no trismus, no deep tissue infection.   Eyes: Conjunctivae and EOM are normal. Pupils are equal, round, and reactive to light. Right eye exhibits no discharge. Left eye exhibits no discharge.  Neck: Normal range of motion. Neck supple.  Cardiovascular: Normal rate and regular rhythm.   Pulmonary/Chest: Effort normal. No respiratory distress. He has rales (faint Rales heard in the lung bases bilaterally). He exhibits no tenderness.  Abdominal: Soft. There is no tenderness. There is no rebound.  Musculoskeletal: He exhibits no tenderness.  ROM appears intact, no obvious focal weakness  Lymphadenopathy:    He has no cervical adenopathy.  Neurological: He is alert.  Skin: Skin is warm and dry. No rash noted.  Psychiatric: He has a normal mood and affect.    ED Course  Procedures (including critical care time)  6:22 PM Pt with URI sxs.  Has hx of PNA, and on exam does have mild rales heard.  CXR ordered, Rapid strep test ordered.  Will ambulate pt with O2 monitor.    7:31 PM Strep test is negative. Chest x-ray without evidence of pneumonia. However when patient ambulate his oxygenation saturation drops down to 90-91% on room air but pt denies having SOB. Does have hx of afib and advance age, which may increase risk of PE, however he is on Eliquis.  Care discussed with Dr. Aline Brochure.    7:40 PM Plan to provide pt with Tamiflu and Avelox for suspected infection with systemic involvement. Pt has normal renal function from prior lab values. Strict return precaution given.  F/u with PCP for further care.  Pt voice understanding and agrees with plan.    Labs Review Labs Reviewed   RAPID STREP SCREEN  CULTURE, GROUP A STREP   Imaging Review Dg Chest 2 View  02/27/2014   CLINICAL DATA:  Cough, shortness of breath  EXAM: CHEST  2 VIEW  COMPARISON:  09/06/2013  FINDINGS: Chronic interstitial markings/emphysematous changes. Mild linear scarring in the right mid lung. Stable mild elevation of the left hemidiaphragm. No pleural effusion or pneumothorax.  The heart is normal in size.  Degenerative changes  of the visualized thoracolumbar spine.  Cholecystectomy clips.  IMPRESSION: No evidence of acute cardiopulmonary disease.  Chronic interstitial markings/emphysematous changes.   Electronically Signed   By: Julian Hy M.D.   On: 02/27/2014 19:23     EKG Interpretation None      MDM   Final diagnoses:  Viral syndrome    BP 120/51  Pulse 91  Temp(Src) 100.6 F (38.1 C) (Rectal)  Resp 18  SpO2 96%  I have reviewed nursing notes and vital signs. I personally reviewed the imaging tests through PACS system  I reviewed available ER/hospitalization records thought the EMR     Domenic Moras, [place] 02/27/14 1951

## 2014-02-27 NOTE — ED Notes (Signed)
SpO2 while ambulating 90-91%RA.  EDPA aware.

## 2014-02-27 NOTE — ED Provider Notes (Signed)
Medical screening examination/treatment/procedure(s) were conducted as a shared visit with non-physician practitioner(s) and myself.  I personally evaluated the patient during the encounter.   EKG Interpretation None      I interviewed and examined the patient. Lungs are CTAB. Cardiac exam wnl. Abdomen soft.  Pt appears well. Denies sob. Chest wall pain only w/ coughing. Low grade temp here. Will cover for influenza and pna. Discussed w/ family. Return precautions provided.   Blanchard Kelch, MD 02/27/14 2340

## 2014-02-27 NOTE — ED Notes (Signed)
Initial Contact - pt to [place] with family, changed to hospital gown, pt c/o sore throat and fevers since Thurs, pt also reports cough/congestion.  Pt denies CP/SOB.  Speaking full/clear sentences, rr even/un-lab.  Skin PWD.  Ambulatory with steady gait.  NAD.

## 2014-02-27 NOTE — Discharge Instructions (Signed)

## 2014-03-01 LAB — CULTURE, GROUP A STREP

## 2014-03-01 NOTE — Assessment & Plan Note (Signed)
TOLerating apixaban Continue same

## 2014-03-01 NOTE — Assessment & Plan Note (Signed)
Not requiring any medications

## 2014-03-05 ENCOUNTER — Other Ambulatory Visit: Payer: Self-pay

## 2014-03-05 ENCOUNTER — Telehealth: Payer: Self-pay

## 2014-03-05 MED ORDER — APIXABAN 2.5 MG PO TABS: 2.5000 mg | ORAL_TABLET | Freq: Two times a day (BID) | ORAL | Status: DC

## 2014-03-05 NOTE — Telephone Encounter (Signed)
Relevant patient education assigned to patient using Emmi. ° °

## 2014-03-08 ENCOUNTER — Other Ambulatory Visit: Payer: Self-pay | Admitting: *Deleted

## 2014-03-08 MED ORDER — APIXABAN 2.5 MG PO TABS: 2.5000 mg | ORAL_TABLET | Freq: Two times a day (BID) | ORAL | Status: DC

## 2014-04-02 ENCOUNTER — Other Ambulatory Visit: Payer: Self-pay | Admitting: Neurosurgery

## 2014-04-02 DIAGNOSIS — I729 Aneurysm of unspecified site: Secondary | ICD-10-CM

## 2014-04-13 ENCOUNTER — Ambulatory Visit
Admission: RE | Admit: 2014-04-13 | Discharge: 2014-04-13 | Disposition: A | Discharge status: Home / Self Care | Payer: Medicare Other | Source: Ambulatory Visit | Attending: Neurosurgery | Admitting: Neurosurgery

## 2014-04-13 DIAGNOSIS — I729 Aneurysm of unspecified site: Secondary | ICD-10-CM

## 2014-04-13 MED ORDER — GADOBENATE DIMEGLUMINE 529 MG/ML IV SOLN
18.0000 mL | Freq: Once | INTRAVENOUS | Status: AC | PRN
  Administered 2014-04-13: 18 mL via INTRAVENOUS

## 2014-04-16 ENCOUNTER — Encounter (HOSPITAL_COMMUNITY): Payer: Self-pay

## 2014-04-16 ENCOUNTER — Ambulatory Visit (HOSPITAL_COMMUNITY)
Admission: RE | Admit: 2014-04-16 | Discharge: 2014-04-16 | Disposition: A | Discharge status: Home / Self Care | Payer: Medicare Other | Source: Ambulatory Visit | Attending: Internal Medicine | Admitting: Internal Medicine

## 2014-04-16 VITALS — BP 100/60 | HR 91 | Wt 195.8 lb

## 2014-04-16 DIAGNOSIS — R7309 Other abnormal glucose: Secondary | ICD-10-CM | POA: Insufficient documentation

## 2014-04-16 DIAGNOSIS — R5381 Other malaise: Secondary | ICD-10-CM | POA: Insufficient documentation

## 2014-04-16 DIAGNOSIS — Z9119 Patient's noncompliance with other medical treatment and regimen: Secondary | ICD-10-CM | POA: Insufficient documentation

## 2014-04-16 DIAGNOSIS — G4733 Obstructive sleep apnea (adult) (pediatric): Secondary | ICD-10-CM | POA: Insufficient documentation

## 2014-04-16 DIAGNOSIS — I10 Essential (primary) hypertension: Secondary | ICD-10-CM | POA: Insufficient documentation

## 2014-04-16 DIAGNOSIS — I671 Cerebral aneurysm, nonruptured: Secondary | ICD-10-CM | POA: Insufficient documentation

## 2014-04-16 DIAGNOSIS — R5383 Other fatigue: Secondary | ICD-10-CM

## 2014-04-16 DIAGNOSIS — Z91199 Patient's noncompliance with other medical treatment and regimen due to unspecified reason: Secondary | ICD-10-CM | POA: Insufficient documentation

## 2014-04-16 DIAGNOSIS — E669 Obesity, unspecified: Secondary | ICD-10-CM | POA: Insufficient documentation

## 2014-04-16 DIAGNOSIS — I080 Rheumatic disorders of both mitral and aortic valves: Secondary | ICD-10-CM | POA: Insufficient documentation

## 2014-04-16 DIAGNOSIS — Z79899 Other long term (current) drug therapy: Secondary | ICD-10-CM | POA: Insufficient documentation

## 2014-04-16 DIAGNOSIS — I4891 Unspecified atrial fibrillation: Secondary | ICD-10-CM | POA: Insufficient documentation

## 2014-04-16 DIAGNOSIS — K589 Irritable bowel syndrome without diarrhea: Secondary | ICD-10-CM | POA: Insufficient documentation

## 2014-04-16 DIAGNOSIS — Z7901 Long term (current) use of anticoagulants: Secondary | ICD-10-CM | POA: Insufficient documentation

## 2014-04-16 NOTE — Patient Instructions (Signed)
We will contact you in 6 months to schedule your next appointment.  

## 2014-04-16 NOTE — Addendum Note (Signed)
Encounter addended by: Scarlette Calico, RN on: 04/16/2014 11:18 AM<BR>     Documentation filed: Orders

## 2014-04-16 NOTE — Progress Notes (Signed)
Patient ID: Maurice Wood, male   DOB: [birth year], 80 y.o.   MRN: [id number]  HPI:  Maurice Wood is a delightful 80 year old male with a history of chronic atrial fibrillation, obstructive sleep apnea, with noncompliance with CPAP. 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 Also has h/o small intracranial aneurysms.  Had episode of CP in January 2011 and underwent cath which showed normal coronaries with ef 45% (? artificially low due to AF). Echo  2/11: 60-65%  Echo 8/13 - Left ventricle: The cavity size was normal. Wall thickness was increased in a pattern of mild LVH. Systolic function was vigorous. The estimated ejection fraction was in the range of 65% to 70%. Wall motion was normal; there were no regional wall motion abnormalities. - Aortic valve: Mild regurgitation. - Mitral valve: Mild to moderate regurgitation. - Left atrium: The atrium was mildly dilated. - Right atrium: The atrium was mildly dilated.  Evaluated at [name] 09/06/13 with chest pain. CEs and ECG negative. (ECG with minimal nonspecific ST abnormality v4-v6)  Sharp pain on left side of his chest while at church. Lasted about 90 minutes and resolved. Received aspirin and nitroglycerin.   Here for routine f/u. Overall doing well. Not overly active despite wife's urgings. About a month ago had a bout of palpitations resolved after a couple hours. Has not recurred. No CP or SOB. Feels fatigued a lot. No problems with bleeding on apixaban.   ROS: All systems negative except as listed in HPI, PMH and Problem List.  Past Medical History  Diagnosis Date  . Atrial fibrillation Jan 2007    echo 1-07 normal ejection fraction, no significant valvular disease. adenosine cardiolite 1-07  no evidence of ischemia. A 48 hour Holter monitor in 7-07 showed good rate controlw/ chronic atrial fibrillation. Cath 1/11 normal cors. EF 45%. Echo 2-11 60-65%  .  Obstructive sleep apnea     noncompliant with CPAP  . Cerebral aneurysm     followed by Dr Estanislado Pandy  . History of chest pain     Cardia catheterization 1999 was normal. myoview 2007 normal. cardiac cath jan 2011 normal cors EF 45%  . Obesity   . Hypertension   . Fatigue   . Allergy     rhinitis  . Hx of colonic polyps     diverticulosis  . Hyperlipidemia   . Retinal vein occlusion   . Benign prostatic hypertrophy   . IBS (irritable bowel syndrome)   . Diabetes mellitus     Elevated glucose/pre-dm  . Incontinence     Per pt 12/14/11    Current Outpatient Prescriptions  Medication Sig Dispense Refill  . apixaban (ELIQUIS) 2.5 MG TABS tablet Take 1 tablet (2.5 mg total) by mouth 2 (two) times daily.  180 tablet  0  . aspirin EC 81 MG tablet Take 81 mg by mouth at bedtime.      . Calcium Carbonate-Vitamin D (CALCIUM + D) 600-200 MG-UNIT TABS Take 1 tablet by mouth daily.       . cetirizine (ZYRTEC) 10 MG tablet Take 10 mg by mouth daily.      . fish oil-omega-3 fatty acids 1000 MG capsule Take 2 g by mouth daily.      . fluticasone (FLONASE) 50 MCG/ACT nasal spray Place into both nostrils daily.      Marland Kitchen ipratropium (ATROVENT) 0.03 % nasal spray Place 2 sprays into both nostrils every 12 (twelve) hours.      Marland Kitchen  Multiple Vitamin (MULTIVITAMIN WITH MINERALS) TABS tablet Take 1 tablet by mouth daily.       No current facility-administered medications for this encounter.     PHYSICAL EXAM: Filed Vitals:   04/16/14 1047  BP: 100/60  Pulse: 91    General:  Well appearing. No resp difficulty HEENT: normal Neck: supple. JVP flat. Carotids 2+ bilaterally; no bruits. No lymphadenopathy or thryomegaly appreciated. Cor: PMI normal. Irregular rate & rhythm. No rubs, gallops or murmurs. Lungs: clear minimal wheeze Abdomen: soft, nontender, nondistended. No hepatosplenomegaly. No bruits or masses. Good bowel sounds. Extremities: no cyanosis, clubbing, rash, trace edema R>L Neuro: alert &  orientedx3, cranial nerves grossly intact. Moves all 4 extremities w/o difficulty. Affect pleasant.   Lab Results  Component Value Date   CHOL 166 02/24/2014   HDL 53.80 02/24/2014   LDLCALC 84 02/24/2014   LDLDIRECT 97.4 11/09/2011   TRIG 143.0 02/24/2014   CHOLHDL 3 02/24/2014    ASSESSMENT & PLAN:  1. A-fib -  chronic  On eliquis 2.5 mg twice a day. Can stop ASA.   2. Fatigue - suspect likely due in part to deconditioning. Have suggested getting a Ecologist (or similar device) to track steps and increasing his current step count by 2,000 steps per day  Jolaine Artist MD 11:02 AM

## 2014-04-16 NOTE — Addendum Note (Signed)
Encounter addended by: Scarlette Calico, RN on: 04/16/2014 11:16 AM<BR>     Documentation filed: Patient Instructions Section

## 2014-04-20 DIAGNOSIS — I671 Cerebral aneurysm, nonruptured: Secondary | ICD-10-CM | POA: Insufficient documentation

## 2014-04-27 ENCOUNTER — Encounter: Payer: Self-pay | Admitting: Internal Medicine

## 2014-05-30 ENCOUNTER — Observation Stay (HOSPITAL_COMMUNITY)
Admission: EM | Admit: 2014-05-30 | Discharge: 2014-06-01 | Disposition: A | Discharge status: Home / Self Care | Payer: Medicare Other | Attending: Cardiology | Admitting: Cardiology

## 2014-05-30 ENCOUNTER — Encounter (HOSPITAL_COMMUNITY): Payer: Self-pay | Admitting: Emergency Medicine

## 2014-05-30 ENCOUNTER — Emergency Department (HOSPITAL_COMMUNITY): Payer: Medicare Other

## 2014-05-30 DIAGNOSIS — E669 Obesity, unspecified: Secondary | ICD-10-CM | POA: Insufficient documentation

## 2014-05-30 DIAGNOSIS — I200 Unstable angina: Principal | ICD-10-CM | POA: Insufficient documentation

## 2014-05-30 DIAGNOSIS — R072 Precordial pain: Secondary | ICD-10-CM

## 2014-05-30 DIAGNOSIS — M25519 Pain in unspecified shoulder: Secondary | ICD-10-CM | POA: Insufficient documentation

## 2014-05-30 DIAGNOSIS — I4821 Permanent atrial fibrillation: Secondary | ICD-10-CM | POA: Diagnosis present

## 2014-05-30 DIAGNOSIS — I4891 Unspecified atrial fibrillation: Secondary | ICD-10-CM | POA: Insufficient documentation

## 2014-05-30 DIAGNOSIS — Z860100 Personal history of colon polyps, unspecified: Secondary | ICD-10-CM | POA: Insufficient documentation

## 2014-05-30 DIAGNOSIS — K589 Irritable bowel syndrome without diarrhea: Secondary | ICD-10-CM | POA: Insufficient documentation

## 2014-05-30 DIAGNOSIS — N400 Enlarged prostate without lower urinary tract symptoms: Secondary | ICD-10-CM | POA: Diagnosis present

## 2014-05-30 DIAGNOSIS — H349 Unspecified retinal vascular occlusion: Secondary | ICD-10-CM | POA: Insufficient documentation

## 2014-05-30 DIAGNOSIS — Z9889 Other specified postprocedural states: Secondary | ICD-10-CM | POA: Insufficient documentation

## 2014-05-30 DIAGNOSIS — I249 Acute ischemic heart disease, unspecified: Secondary | ICD-10-CM

## 2014-05-30 DIAGNOSIS — K219 Gastro-esophageal reflux disease without esophagitis: Secondary | ICD-10-CM | POA: Diagnosis present

## 2014-05-30 DIAGNOSIS — Z87891 Personal history of nicotine dependence: Secondary | ICD-10-CM | POA: Insufficient documentation

## 2014-05-30 DIAGNOSIS — G4730 Sleep apnea, unspecified: Secondary | ICD-10-CM | POA: Diagnosis present

## 2014-05-30 DIAGNOSIS — R079 Chest pain, unspecified: Secondary | ICD-10-CM | POA: Diagnosis present

## 2014-05-30 DIAGNOSIS — G4733 Obstructive sleep apnea (adult) (pediatric): Secondary | ICD-10-CM | POA: Insufficient documentation

## 2014-05-30 DIAGNOSIS — E785 Hyperlipidemia, unspecified: Secondary | ICD-10-CM | POA: Diagnosis present

## 2014-05-30 DIAGNOSIS — Z7901 Long term (current) use of anticoagulants: Secondary | ICD-10-CM

## 2014-05-30 DIAGNOSIS — E119 Type 2 diabetes mellitus without complications: Secondary | ICD-10-CM | POA: Diagnosis present

## 2014-05-30 DIAGNOSIS — IMO0002 Reserved for concepts with insufficient information to code with codable children: Secondary | ICD-10-CM | POA: Insufficient documentation

## 2014-05-30 DIAGNOSIS — I7771 Dissection of carotid artery: Secondary | ICD-10-CM | POA: Diagnosis present

## 2014-05-30 DIAGNOSIS — I671 Cerebral aneurysm, nonruptured: Secondary | ICD-10-CM | POA: Insufficient documentation

## 2014-05-30 DIAGNOSIS — Z79899 Other long term (current) drug therapy: Secondary | ICD-10-CM | POA: Insufficient documentation

## 2014-05-30 LAB — BASIC METABOLIC PANEL
BUN: 13 mg/dL (ref 6–23)
CHLORIDE: 110 meq/L (ref 96–112)
CO2: 24 meq/L (ref 19–32)
Calcium: 9.7 mg/dL (ref 8.4–10.5)
Creatinine, Ser: 0.88 mg/dL (ref 0.50–1.35)
GFR calc Af Amer: >90 mL/min (ref >90)
GFR calc non Af Amer: 80 mL/min — ABNORMAL LOW (ref >90)
Glucose, Bld: 87 mg/dL (ref 70–99)
Potassium: 4.2 mEq/L (ref 3.7–5.3)
SODIUM: 146 meq/L (ref 137–147)

## 2014-05-30 LAB — I-STAT TROPONIN, ED: TROPONIN I, POC: 0.01 ng/mL (ref 0.00–0.08)

## 2014-05-30 LAB — CBC
HCT: 43.6 % (ref 39.0–52.0)
Hemoglobin: 14.4 g/dL (ref 13.0–17.0)
MCH: 30 pg (ref 26.0–34.0)
MCHC: 33 g/dL (ref 30.0–36.0)
MCV: 90.8 fL (ref 78.0–100.0)
Platelets: 156 10*3/uL (ref 150–400)
RBC: 4.8 MIL/uL (ref 4.22–5.81)
RDW: 13.9 % (ref 11.5–15.5)
WBC: 6.2 10*3/uL (ref 4.0–10.5)

## 2014-05-30 LAB — TSH: TSH: 2.3 u[IU]/mL (ref 0.350–4.500)

## 2014-05-30 LAB — T4, FREE: Free T4: 1.05 ng/dL (ref 0.80–1.80)

## 2014-05-30 LAB — PROTIME-INR
INR: 1.12 (ref 0.00–1.49)
Prothrombin Time: 14.2 seconds (ref 11.6–15.2)

## 2014-05-30 LAB — MAGNESIUM: MAGNESIUM: 1.9 mg/dL (ref 1.5–2.5)

## 2014-05-30 LAB — TROPONIN I
Troponin I: <0.3 ng/mL (ref <0.30)
Troponin I: <0.3 ng/mL (ref <0.30)

## 2014-05-30 LAB — PRO B NATRIURETIC PEPTIDE: PRO B NATRI PEPTIDE: 571.8 pg/mL — AB (ref 0–450)

## 2014-05-30 LAB — HEMOGLOBIN A1C
Hgb A1c MFr Bld: 5.8 % — ABNORMAL HIGH (ref <5.7)
MEAN PLASMA GLUCOSE: 120 mg/dL — AB (ref <117)

## 2014-05-30 LAB — APTT: aPTT: 29 seconds (ref 24–37)

## 2014-05-30 MED ORDER — FLUTICASONE PROPIONATE 50 MCG/ACT NA SUSP
1.0000 | Freq: Every day | NASAL | Status: DC | PRN
  Filled 2014-05-30: qty 16

## 2014-05-30 MED ORDER — IPRATROPIUM BROMIDE 0.03 % NA SOLN
2.0000 | Freq: Two times a day (BID) | NASAL | Status: DC | PRN
  Filled 2014-05-30: qty 30

## 2014-05-30 MED ORDER — LORATADINE 10 MG PO TABS
10.0000 mg | ORAL_TABLET | Freq: Every day | ORAL | Status: DC
  Filled 2014-05-30 (×2): qty 1

## 2014-05-30 MED ORDER — ASPIRIN EC 81 MG PO TBEC
81.0000 mg | DELAYED_RELEASE_TABLET | Freq: Every day | ORAL | Status: DC
  Filled 2014-05-30: qty 1

## 2014-05-30 MED ORDER — CALCIUM CARBONATE-VITAMIN D 600-200 MG-UNIT PO TABS: 1.0000 | ORAL_TABLET | Freq: Every day | ORAL | Status: DC

## 2014-05-30 MED ORDER — ASPIRIN 300 MG RE SUPP: 300.0000 mg | RECTAL | Status: AC

## 2014-05-30 MED ORDER — ASPIRIN 81 MG PO CHEW
324.0000 mg | CHEWABLE_TABLET | ORAL | Status: AC
  Administered 2014-05-30: 324 mg via ORAL
  Filled 2014-05-30: qty 4

## 2014-05-30 MED ORDER — SODIUM CHLORIDE 0.9 % IV SOLN: 250.0000 mL | INTRAVENOUS | Status: DC | PRN

## 2014-05-30 MED ORDER — SODIUM CHLORIDE 0.9 % IJ SOLN: 3.0000 mL | INTRAMUSCULAR | Status: DC | PRN

## 2014-05-30 MED ORDER — CALCIUM CARBONATE-VITAMIN D 500-200 MG-UNIT PO TABS
1.0000 | ORAL_TABLET | Freq: Every day | ORAL | Status: DC
  Filled 2014-05-30 (×3): qty 1

## 2014-05-30 MED ORDER — ADULT MULTIVITAMIN W/MINERALS CH
1.0000 | ORAL_TABLET | Freq: Every day | ORAL | Status: DC
Start: 2014-05-30 — End: 2014-06-01
  Filled 2014-05-30 (×3): qty 1

## 2014-05-30 MED ORDER — NITROGLYCERIN 0.4 MG SL SUBL: 0.4000 mg | SUBLINGUAL_TABLET | SUBLINGUAL | Status: DC | PRN

## 2014-05-30 MED ORDER — ACETAMINOPHEN 500 MG PO TABS
1000.0000 mg | ORAL_TABLET | Freq: Four times a day (QID) | ORAL | Status: DC | PRN
  Administered 2014-06-01: 1000 mg via ORAL
  Filled 2014-05-30: qty 2

## 2014-05-30 MED ORDER — MIRABEGRON ER 50 MG PO TB24
50.0000 mg | ORAL_TABLET | Freq: Every day | ORAL | Status: DC
  Administered 2014-05-31: 50 mg via ORAL
  Filled 2014-05-30 (×2): qty 1

## 2014-05-30 MED ORDER — SODIUM CHLORIDE 0.9 % IJ SOLN
3.0000 mL | Freq: Two times a day (BID) | INTRAMUSCULAR | Status: DC
  Administered 2014-05-30 – 2014-05-31 (×2): 3 mL via INTRAVENOUS

## 2014-05-30 MED ORDER — SODIUM CHLORIDE 0.9 % IV BOLUS (SEPSIS)
250.0000 mL | Freq: Once | INTRAVENOUS | Status: AC
  Administered 2014-05-30: 250 mL via INTRAVENOUS

## 2014-05-30 NOTE — ED Notes (Signed)
Family at bedside. 

## 2014-05-30 NOTE — ED Notes (Signed)
MD at bedside. 

## 2014-05-30 NOTE — ED Notes (Signed)
Attempted to call report

## 2014-05-30 NOTE — H&P (Signed)
Patient seen and examined and history reviewed. Agree with above findings and plan. Patient presents with recurrent chest pain with atypical features. Normal cardiac cath in 2011. Chronic Afib on Eliquis. Exam reveals clear lungs. CV with irregular rhythm. No gallops or murmurs. No edema. No chest wall tenderness to palpation. Ecg without acute ST-T changes. Troponin is negative. PE is very unlikely with him on Eliquis. Recommend overnight observation with serial enzymes and Ecg. Will plan stress Myoview in am. Will tentatively hold Eliquis in the event he needs a cardiac cath. If stress test is negative he could be DC tomorrow.   Peter Martinique, [place] 05/30/2014 2:39 PM

## 2014-05-30 NOTE — ED Provider Notes (Signed)
CSN: [id number]     Arrival date & time 05/30/14  8242 History   First MD Initiated Contact with Patient 05/30/14 1131     Chief Complaint  Patient presents with  . Chest Pain     (Consider location/radiation/quality/duration/timing/severity/associated sxs/prior Treatment) The history is provided by the patient. No language interpreter was used.  Larri Yehle Kernan is a 80 year old male with past medical history of atrial fibrillation rate controlled on pelvic was, sleep apnea, cerebral aneurysm, hyperlipidemia, diabetes presenting to the ED with chest pain that has been ongoing intermittently for the past week with worsening this morning while patient was at church. Patient reports that the pain is a ice pick sensation, sharp piercing discomfort that starts in the center of his chest and radiates to his left shoulder and down his left arm. Denied shortness of breath, dizziness, diaphoresis associated with the chest pain. Reported there is no trend-does not occur or increased with exertion, can even occur at rest. Stated that he's been using Tylenol with minimal relief. Reported that he is followed by Dr. Magnus Sinning cardiology. Denied diaphoresis, syncope, numbness, tingling, blurred vision, sudden loss of vision, neck pain, jaw pain, shortness of breath, difficulty breathing, fever, chills, cough, nasal congestion, travel, leg swelling. PCP Dr. Leanne Chang Cardiologist Dr. Haroldine Laws  Past Medical History  Diagnosis Date  . Atrial fibrillation Jan 2007    echo 1-07 normal ejection fraction, no significant valvular disease. adenosine cardiolite 1-07  no evidence of ischemia. A 48 hour Holter monitor in 7-07 showed good rate controlw/ chronic atrial fibrillation. Cath 1/11 normal cors. EF 45%. Echo 2-11 60-65%  . Obstructive sleep apnea     noncompliant with CPAP  . Cerebral aneurysm     followed by Dr Estanislado Pandy  . History of chest pain     Cardia catheterization 1999 was normal. myoview 2007 normal.  cardiac cath jan 2011 normal cors EF 45%  . Obesity   . Fatigue   . Allergy     rhinitis  . Hx of colonic polyps     diverticulosis  . Hyperlipidemia   . Retinal vein occlusion   . Benign prostatic hypertrophy   . IBS (irritable bowel syndrome)   . Incontinence     Per pt 12/14/11  . H/O cardiac catheterizationn 12/2009 with normal coronary arteries 05/30/2014   Past Surgical History  Procedure Laterality Date  . Cholecystectomy    . Inguinal and umbilical herniorrhaphy    . Tonsillectomy and adenoidectomy  as a child  . Cardiac catheterization  1999  . Hernia repair     Family History  Problem Relation Age of Onset  . Cancer Father     lung  . Alcohol abuse Father   . Aneurysm Father     Aortic aneurysm  . Diabetes Son   . Cancer Maternal Grandmother     colon  . Diabetes Maternal Uncle   . Diabetes Paternal Uncle   . Sudden death Mother   . Heart disease Paternal Grandfather    History  Substance Use Topics  . Smoking status: Former Smoker    Types: Cigarettes  . Smokeless tobacco: Never Used     Comment: quit 1973  . Alcohol Use: 0.6 oz/week    1 Cans of beer per week     Comment: occ    Review of Systems  Constitutional: Negative for fever and chills.  Respiratory: Negative for chest tightness and shortness of breath.   Cardiovascular: Positive for chest pain. Negative for  palpitations and leg swelling.  Gastrointestinal: Negative for nausea, vomiting and abdominal pain.  Musculoskeletal: Positive for arthralgias (Left shoulder and left arm). Negative for back pain and neck pain.  Neurological: Negative for dizziness, weakness and headaches.      Allergies  Clarithromycin; Epinephrine; and Nimodipine  Home Medications   Prior to Admission medications   Medication Sig Start Date End Date Taking? Authorizing Provider  acetaminophen (TYLENOL) 500 MG tablet Take 1,000 mg by mouth every 6 (six) hours as needed.   Yes Historical Provider, MD  apixaban  (ELIQUIS) 2.5 MG TABS tablet Take 1 tablet (2.5 mg total) by mouth 2 (two) times daily. 03/08/14  Yes Jolaine Artist, MD  Calcium Carbonate-Vitamin D (CALCIUM + D) 600-200 MG-UNIT TABS Take 1 tablet by mouth daily.    Yes Historical Provider, MD  cetirizine (ZYRTEC) 10 MG tablet Take 10 mg by mouth daily.   Yes Historical Provider, MD  fluticasone (FLONASE) 50 MCG/ACT nasal spray Place 1 spray into both nostrils daily as needed for allergies.   Yes Historical Provider, MD  ipratropium (ATROVENT) 0.03 % nasal spray Place 2 sprays into both nostrils every 12 (twelve) hours as needed (for allergies).   Yes Historical Provider, MD  Multiple Vitamin (MULTIVITAMIN WITH MINERALS) TABS tablet Take 1 tablet by mouth daily.   Yes Historical Provider, MD  MYRBETRIQ 50 MG TB24 tablet Take 50 mg by mouth daily. 05/06/14  Yes Historical Provider, MD   BP 132/88  Pulse 61  Temp(Src) 98.4 F (36.9 C) (Oral)  Resp 17  SpO2 92% Physical Exam  Nursing note and vitals reviewed. Constitutional: He is oriented to person, place, and time. He appears well-developed and well-nourished. No distress.  Patient sitting comfortably in bed  HENT:  Head: Normocephalic and atraumatic.  Mouth/Throat: Oropharynx is clear and moist. No oropharyngeal exudate.  Eyes: Conjunctivae and EOM are normal. Pupils are equal, round, and reactive to light. Right eye exhibits no discharge. Left eye exhibits no discharge.  Neck: Normal range of motion. Neck supple. No tracheal deviation present.  Cardiovascular: Exam reveals no friction rub.   No murmur heard. Cap refill less than 3 seconds Negative swelling or pitting edema identified to lower extremities bilaterally  Pulmonary/Chest: Effort normal and breath sounds normal. No respiratory distress. He has no wheezes. He has no rales. He exhibits no tenderness.  Patient is able to speak in sentences without difficulty Negative use of accessory muscles Stridor Negative pain upon  palpation to the chest wall-negative crepitus upon palpation  Musculoskeletal: Normal range of motion. He exhibits no tenderness.  Full ROM to upper and lower extremities without difficulty noted, negative ataxia noted.  Lymphadenopathy:    He has no cervical adenopathy.  Neurological: He is alert and oriented to person, place, and time. No cranial nerve deficit. He exhibits normal muscle tone. Coordination normal.  Cranial nerves III-XII grossly intact Strength 5+/5+ to upper and lower extremities bilaterally with resistance applied, equal distribution noted Equal grip strength bilaterally Negative facial drooping Negative slurred speech Negative aphasia  Skin: Skin is warm and dry. No rash noted. He is not diaphoretic. No erythema.  Psychiatric: He has a normal mood and affect. His behavior is normal. Thought content normal.    ED Course  Procedures (including critical care time)  1:04 PM This provider spoke with Dr. Stanford Breed, [place] - Abnormal; Notable for the following:    GFR calc non Af Amer 80 (*)  All other components within normal limits  CBC  I-STAT TROPOININ, ED    Imaging Review Dg Chest 2 View  05/30/2014   CLINICAL DATA:  Chest pain, LEFT arm pain, history atrial fibrillation, diabetes  EXAM: CHEST  2 VIEW  COMPARISON:  02/27/2014  FINDINGS: Borderline enlargement of cardiac silhouette.  Tortuous aorta.  Mediastinal contours and pulmonary vascularity normal.  Chronic peribronchial thickening and question minimal emphysematous changes.  No pulmonary infiltrate, pleural effusion or pneumothorax.  Chronic blunting of the LEFT lateral costophrenic angle unchanged.  IMPRESSION: Chronic bronchitic and question emphysematous changes with LEFT basilar scarring.  No acute abnormalities.   Electronically Signed   By: Lavonia Dana M.D.   On: 05/30/2014 11:35     EKG Interpretation None      MDM   Final diagnoses:   ACS (acute coronary syndrome)    Medications  sodium chloride 0.9 % bolus 250 mL (0 mLs Intravenous Stopped 05/30/14 1418)   Filed Vitals:   05/30/14 1400 05/30/14 1415 05/30/14 1430 05/30/14 1445  BP: 125/83 115/70 130/68 132/88  Pulse:  77 61   Temp:      TempSrc:      Resp: 15 15 17 17   SpO2:  95% 92%    EKG noted atrial fibrillation rate controlled with a heart rate of 77 beats per minute. Troponin negative elevation. CBC negative elevated white blood cell count. BMP negative findings-kidney functioning well. Anion gap of 12.0 mEq per liter. Chest x-ray identified chronic bronchitic and question emphysema changes with left basilar scarring-findings consistent with possible COPD secondary to patient's history of smoking. No acute abnormalities identified. Doubt PE. Patient seen and assessed by cardiology in ED setting. Patient to be admitted to cardiology services regarding ACS. Discussed plan for admission with patient who agreed to plan of care. Patient stable for transfer.  Jamse Mead, PA-C 05/30/14 2102

## 2014-05-30 NOTE — ED Notes (Signed)
Report given to [place], [place]

## 2014-05-30 NOTE — H&P (Signed)
Maurice Wood is an 80 y.o. male.    Primary Cardiologist:Dr. [name] DUK:RCVKFM,MCRFV [name], MD  Chief Complaint: chest pain for several days  HPI: 80 year old male with a history of chronic atrial fibrillation on Eliquis, obstructive sleep apnea- with noncompliance with CPAP. 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  Also has h/o small intracranial aneurysms. Had episode of CP in January 2011 and underwent cath which showed normal coronaries with ef 45% (? artificially low due to AF). Echo 2/11: 60-65% .  Echo 8/13  - Left ventricle: The cavity size was normal. Wall thickness was increased in a pattern of mild LVH. Systolic function was vigorous. The estimated ejection fraction was in the range of 65% to 70%. Wall motion was normal; there were no regional wall motion abnormalities. - Aortic valve: Mild regurgitation. - Mitral valve: Mild to moderate regurgitation. - Left atrium: The atrium was mildly dilated. - Right atrium: The atrium was mildly dilated.  Evaluated at [facility] 09/06/13 with chest pain. CEs and ECG negative. (ECG with minimal nonspecific ST abnormality v4-v6) Sharp pain on left side of his chest while at church. Lasted about 90 minutes and resolved. Received aspirin and nitroglycerin.   Over the last several days pt has had sharp Lt ant chest pain with radiation down his Lt arm, no associated symptoms of nausea, SOB or diaphoresis.  EKG with his chronic a fib but no acute changes from previous tracing.  Pain comes and goes and does not seem to be associated with movement.  He is able to sleep without pain or is not aware of pain.  Today it was worse than usual, has gradually been increasing in severity.   In ER currently no pain, rate controlled A fib and stable VS.  No recent colds or fevers.  Troponin poc is 0.01 and labs otherwise stable.   Past Medical History  Diagnosis Date  . Atrial fibrillation Jan  2007    echo 1-07 normal ejection fraction, no significant valvular disease. adenosine cardiolite 1-07  no evidence of ischemia. A 48 hour Holter monitor in 7-07 showed good rate controlw/ chronic atrial fibrillation. Cath 1/11 normal cors. EF 45%. Echo 2-11 60-65%  . Obstructive sleep apnea     noncompliant with CPAP  . Cerebral aneurysm     followed by Dr Estanislado Pandy  . History of chest pain     Cardia catheterization 1999 was normal. myoview 2007 normal. cardiac cath jan 2011 normal cors EF 45%  . Obesity   . Fatigue   . Allergy     rhinitis  . Hx of colonic polyps     diverticulosis  . Hyperlipidemia   . Retinal vein occlusion   . Benign prostatic hypertrophy   . IBS (irritable bowel syndrome)   . Incontinence     Per pt 12/14/11  . H/O cardiac catheterizationn 12/2009 with normal coronary arteries 05/30/2014    Past Surgical History  Procedure Laterality Date  . Cholecystectomy    . Inguinal and umbilical herniorrhaphy    . Tonsillectomy and adenoidectomy  as a child  . Cardiac catheterization  1999  . Hernia repair      Family History  Problem Relation Age of Onset  . Cancer Father     lung  . Alcohol abuse Father   . Aneurysm Father     Aortic aneurysm  . Diabetes Son   . Cancer Maternal Grandmother  colon  . Diabetes Maternal Uncle   . Diabetes Paternal Uncle   . Sudden death Mother   . Heart disease Paternal Grandfather    Social History:  reports that he has quit smoking. His smoking use included Cigarettes. He smoked 0.00 packs per day. He has never used smokeless tobacco. He reports that he drinks about .6 ounces of alcohol per week. He reports that he does not use illicit drugs.  Married.  Allergies:  Allergies  Allergen Reactions  . Clarithromycin     REACTION: Headache  . Epinephrine     REACTION: Increased Heart Rate  . Nimodipine     REACTION: Flushing    Prior to Admission medications   Medication Sig Start Date End Date Taking? Authorizing  Provider  acetaminophen (TYLENOL) 500 MG tablet Take 1,000 mg by mouth every 6 (six) hours as needed.   Yes Historical Provider, MD  apixaban (ELIQUIS) 2.5 MG TABS tablet Take 1 tablet (2.5 mg total) by mouth 2 (two) times daily. 03/08/14  Yes Jolaine Artist, MD  Calcium Carbonate-Vitamin D (CALCIUM + D) 600-200 MG-UNIT TABS Take 1 tablet by mouth daily.    Yes Historical Provider, MD  cetirizine (ZYRTEC) 10 MG tablet Take 10 mg by mouth daily.   Yes Historical Provider, MD  fluticasone (FLONASE) 50 MCG/ACT nasal spray Place 1 spray into both nostrils daily as needed for allergies.   Yes Historical Provider, MD  ipratropium (ATROVENT) 0.03 % nasal spray Place 2 sprays into both nostrils every 12 (twelve) hours as needed (for allergies).   Yes Historical Provider, MD  Multiple Vitamin (MULTIVITAMIN WITH MINERALS) TABS tablet Take 1 tablet by mouth daily.   Yes Historical Provider, MD  MYRBETRIQ 50 MG TB24 tablet Take 50 mg by mouth daily. 05/06/14  Yes Historical Provider, MD     Results for orders placed during the hospital encounter of 05/30/14 (from the past 48 hour(s))  CBC     Status: None   Collection Time    05/30/14 10:10 AM      Result Value Ref Range   WBC 6.2  4.0 - 10.5 K/uL   RBC 4.80  4.22 - 5.81 MIL/uL   Hemoglobin 14.4  13.0 - 17.0 g/dL   HCT 43.6  39.0 - 52.0 %   MCV 90.8  78.0 - 100.0 fL   MCH 30.0  26.0 - 34.0 pg   MCHC 33.0  30.0 - 36.0 g/dL   RDW 13.9  11.5 - 15.5 %   Platelets 156  150 - 400 K/uL  BASIC METABOLIC PANEL     Status: Abnormal   Collection Time    05/30/14 10:10 AM      Result Value Ref Range   Sodium 146  137 - 147 mEq/L   Potassium 4.2  3.7 - 5.3 mEq/L   Chloride 110  96 - 112 mEq/L   CO2 24  19 - 32 mEq/L   Glucose, Bld 87  70 - 99 mg/dL   BUN 13  6 - 23 mg/dL   Creatinine, Ser 0.88  0.50 - 1.35 mg/dL   Calcium 9.7  8.4 - 10.5 mg/dL   GFR calc non Af Amer 80 (*) >90 mL/min   GFR calc Af Amer >90  >90 mL/min   Comment: (NOTE)     The eGFR  has been calculated using the CKD EPI equation.     This calculation has not been validated in all clinical situations.     eGFR's  persistently <90 mL/min signify possible Chronic Kidney     Disease.  Randolm Idol, ED     Status: None   Collection Time    05/30/14 10:35 AM      Result Value Ref Range   Troponin i, poc 0.01  0.00 - 0.08 ng/mL   Comment 3            Comment: Due to the release kinetics of cTnI,     a negative result within the first hours     of the onset of symptoms does not rule out     myocardial infarction with certainty.     If myocardial infarction is still suspected,     repeat the test at appropriate intervals.   Dg Chest 2 View  05/30/2014   CLINICAL DATA:  Chest pain, LEFT arm pain, history atrial fibrillation, diabetes  EXAM: CHEST  2 VIEW  COMPARISON:  02/27/2014  FINDINGS: Borderline enlargement of cardiac silhouette.  Tortuous aorta.  Mediastinal contours and pulmonary vascularity normal.  Chronic peribronchial thickening and question minimal emphysematous changes.  No pulmonary infiltrate, pleural effusion or pneumothorax.  Chronic blunting of the LEFT lateral costophrenic angle unchanged.  IMPRESSION: Chronic bronchitic and question emphysematous changes with LEFT basilar scarring.  No acute abnormalities.   Electronically Signed   By: Lavonia Dana M.D.   On: 05/30/2014 11:35    ROS: General:no colds or fevers since the winter, no weight changes Skin:no rashes or ulcers HEENT:no blurred vision, no congestion, no headache CV:see HPI PUL:see HPI GI:no diarrhea constipation or melena, no indigestion GU:no hematuria, no dysuria MS:no joint pain, no claudication Neuro:no syncope, no lightheadedness Endo:no diabetes, no thyroid disease, no night sweats  Lipids from March:   Lipid Panel     Component Value Date/Time   CHOL 166 02/24/2014 0833   TRIG 143.0 02/24/2014 0833   HDL 53.80 02/24/2014 0833   CHOLHDL 3 02/24/2014 0833   VLDL 28.6 02/24/2014 0833     LDLCALC 84 02/24/2014 0833       Blood pressure 105/76, pulse 83, temperature 98.4 F (36.9 C), temperature source Oral, resp. rate 16, SpO2 94.00%. PE: General:Pleasant affect, NAD Skin:Warm and dry, brisk capillary refill HEENT:normocephalic, sclera clear, mucus membranes moist Neck:supple, no JVD, no bruits, no thyromegaly, no adenopathy  Heart:irreg irreg  without murmur, gallup, rub or click, no chest wall tenderness to palpation Lungs:mostly clear with fine rales bibasilar, no rhonchi, or wheezes INO:MVEH, non tender, + BS, do not palpate liver spleen or masses Ext:tr lower ext edema, 1+ pedal pulses, 2+ radial pulses Neuro:alert and oriented X 3, MAE, follows commands, + facial symmetry    Assessment/Plan Principal Problem:   Chest pain admit to tele, rule out MI, hold eliquis for 24 hours if no cath needed then resume and plan for lexiscan myoview tomorrow unless enzymes are +.  Will add low dose ASA for chest pain, but unless he needs stent this should be stopped on discharge. Active Problems:   Long term current use of anticoagulant- with Eliquis- hold for possible cath if nuc positive   Atrial fibrillation, permanent- rate controlled   SLEEP APNEA, intolerant to CPAP    H/O cardiac catheterizationn 12/2009 with normal coronary arteries   hold statin unless CAD LDL 80   Hold BB for now unless Positive for CAD- a fib is rate controlled  MCNOBS,JGGEZ R Nurse Practitioner Certified [place] Pager [phone] or after 5pm or weekends call [phone] 05/30/2014, 1:52 PM

## 2014-05-30 NOTE — ED Notes (Addendum)
Pt reports central 6/10 "sharp" chest pain that radiates to left arm that started at 0900 this morning. Denies N/V, diaphoresis, SOB, dizziness or lightheadedness. NAD. Pt noted to be in A fib, hx of same.

## 2014-05-30 NOTE — ED Notes (Signed)
Admitting PA at bedside.

## 2014-05-31 ENCOUNTER — Inpatient Hospital Stay (HOSPITAL_COMMUNITY): Payer: Medicare Other

## 2014-05-31 DIAGNOSIS — R079 Chest pain, unspecified: Secondary | ICD-10-CM

## 2014-05-31 LAB — BASIC METABOLIC PANEL
BUN: 13 mg/dL (ref 6–23)
CO2: 24 mEq/L (ref 19–32)
Calcium: 9 mg/dL (ref 8.4–10.5)
Chloride: 107 mEq/L (ref 96–112)
Creatinine, Ser: 0.97 mg/dL (ref 0.50–1.35)
GFR, EST AFRICAN AMERICAN: 89 mL/min — AB (ref >90)
GFR, EST NON AFRICAN AMERICAN: 77 mL/min — AB (ref >90)
Glucose, Bld: 136 mg/dL — ABNORMAL HIGH (ref 70–99)
POTASSIUM: 4.5 meq/L (ref 3.7–5.3)
Sodium: 141 mEq/L (ref 137–147)

## 2014-05-31 LAB — CBC
HCT: 41.1 % (ref 39.0–52.0)
Hemoglobin: 13.5 g/dL (ref 13.0–17.0)
MCH: 29.7 pg (ref 26.0–34.0)
MCHC: 32.8 g/dL (ref 30.0–36.0)
MCV: 90.5 fL (ref 78.0–100.0)
PLATELETS: 138 10*3/uL — AB (ref 150–400)
RBC: 4.54 MIL/uL (ref 4.22–5.81)
RDW: 14 % (ref 11.5–15.5)
WBC: 5.2 10*3/uL (ref 4.0–10.5)

## 2014-05-31 LAB — PROTIME-INR
INR: 1.08 (ref 0.00–1.49)
PROTHROMBIN TIME: 13.8 s (ref 11.6–15.2)

## 2014-05-31 LAB — TROPONIN I: Troponin I: <0.3 ng/mL (ref <0.30)

## 2014-05-31 MED ORDER — SODIUM CHLORIDE 0.9 % IJ SOLN
3.0000 mL | Freq: Two times a day (BID) | INTRAMUSCULAR | Status: DC
  Administered 2014-05-31: 3 mL via INTRAVENOUS

## 2014-05-31 MED ORDER — TECHNETIUM TC 99M SESTAMIBI GENERIC - CARDIOLITE
30.0000 | Freq: Once | INTRAVENOUS | Status: AC | PRN
  Administered 2014-05-31: 30 via INTRAVENOUS

## 2014-05-31 MED ORDER — TECHNETIUM TC 99M SESTAMIBI GENERIC - CARDIOLITE
10.0000 | Freq: Once | INTRAVENOUS | Status: AC | PRN
Start: 2014-05-31 — End: 2014-05-31
  Administered 2014-05-31: 10 via INTRAVENOUS

## 2014-05-31 MED ORDER — SODIUM CHLORIDE 0.9 % IJ SOLN: 3.0000 mL | INTRAMUSCULAR | Status: DC | PRN

## 2014-05-31 MED ORDER — SODIUM CHLORIDE 0.9 % IV SOLN
1.0000 mL/kg/h | INTRAVENOUS | Status: DC
  Administered 2014-06-01: 1 mL/kg/h via INTRAVENOUS

## 2014-05-31 MED ORDER — ASPIRIN 81 MG PO CHEW
81.0000 mg | CHEWABLE_TABLET | ORAL | Status: AC
  Administered 2014-06-01: 81 mg via ORAL
  Filled 2014-05-31: qty 1

## 2014-05-31 MED ORDER — SODIUM CHLORIDE 0.9 % IV SOLN: 250.0000 mL | INTRAVENOUS | Status: DC | PRN

## 2014-05-31 MED ORDER — REGADENOSON 0.4 MG/5ML IV SOLN
INTRAVENOUS | Status: AC
  Filled 2014-05-31: qty 5

## 2014-05-31 NOTE — Progress Notes (Signed)
    Subjective:  Denies CP or dyspnea   Objective:  Filed Vitals:   05/31/14 1015 05/31/14 1016 05/31/14 1017 05/31/14 1020  BP:  134/73  163/87  Pulse: 125 134 133 103  Temp:      TempSrc:      Resp:      Height:      Weight:      SpO2:        Intake/Output from previous day:  Intake/Output Summary (Last 24 hours) at 05/31/14 1209 Last data filed at 05/30/14 1700  Gross per 24 hour  Intake    550 ml  Output      0 ml  Net    550 ml    Physical Exam: Physical exam: Well-developed well-nourished in no acute distress.  Skin is warm and dry.  HEENT is normal.  Neck is supple. Chest is clear to auscultation with normal expansion.  Cardiovascular exam is irregular Abdominal exam nontender or distended. No masses palpated. Extremities show no edema. neuro grossly intact    Lab Results: Basic Metabolic Panel:  Recent Labs  05/30/14 1010 05/30/14 1659 05/31/14 0335  NA 146  --  141  K 4.2  --  4.5  CL 110  --  107  CO2 24  --  24  GLUCOSE 87  --  136*  BUN 13  --  13  CREATININE 0.88  --  0.97  CALCIUM 9.7  --  9.0  MG  --  1.9  --    CBC:  Recent Labs  05/30/14 1010 05/31/14 0335  WBC 6.2 5.2  HGB 14.4 13.5  HCT 43.6 41.1  MCV 90.8 90.5  PLT 156 138*   Cardiac Enzymes:  Recent Labs  05/30/14 1659 05/30/14 2158 05/31/14 0335  TROPONINI <0.30 <0.30 <0.30     Assessment/Plan:  1 chest pain-symptoms are atypical. Await results of nuclear study. If negative discharge later today and followup with Dr. Haroldine Laws. 2 atrial fibrillation-rate is controlled on no medications. Resume apixaban at DC.   Kirk Ruths 05/31/2014, 12:09 PM

## 2014-05-31 NOTE — Progress Notes (Signed)
Patient's treadmill stress test demonstrated inferolateral wall infarct with mild peri-infarct ischemia on stress image, EF 70%. Review of his record shows patient underwent cath in 12/2009 which revealed normal coronaries. Dr. Stanford Breed informed of the stress test result, patient will be put on for cardiac catheterization for tomorrow.   His Eliquis has been held in admission, will check with Dr. Stanford Breed for consideration of starting IV heparin.  Hilbert Corrigan PA Pager: [phone]

## 2014-05-31 NOTE — Progress Notes (Signed)
Utilization review completed. Bertha Stanfill, RN, BSN. 

## 2014-06-01 ENCOUNTER — Encounter (HOSPITAL_COMMUNITY): Payer: Self-pay | Admitting: Physician Assistant

## 2014-06-01 ENCOUNTER — Encounter (HOSPITAL_COMMUNITY)
Admission: EM | Disposition: A | Discharge status: Home / Self Care | Payer: Self-pay | Source: Home / Self Care | Attending: Cardiology

## 2014-06-01 ENCOUNTER — Encounter (HOSPITAL_COMMUNITY): Payer: Self-pay

## 2014-06-01 ENCOUNTER — Ambulatory Visit (HOSPITAL_COMMUNITY): Admit: 2014-06-01 | Payer: Self-pay | Admitting: Cardiology

## 2014-06-01 DIAGNOSIS — R079 Chest pain, unspecified: Secondary | ICD-10-CM

## 2014-06-01 HISTORY — PX: LEFT HEART CATHETERIZATION WITH CORONARY ANGIOGRAM: SHX5451

## 2014-06-01 SURGERY — LEFT HEART CATHETERIZATION WITH CORONARY ANGIOGRAM
Anesthesia: LOCAL

## 2014-06-01 MED ORDER — NITROGLYCERIN 0.2 MG/ML ON CALL CATH LAB
INTRAVENOUS | Status: AC
  Filled 2014-06-01: qty 1

## 2014-06-01 MED ORDER — HEPARIN SODIUM (PORCINE) 1000 UNIT/ML IJ SOLN
INTRAMUSCULAR | Status: AC
  Filled 2014-06-01: qty 1

## 2014-06-01 MED ORDER — LIDOCAINE HCL (PF) 1 % IJ SOLN
INTRAMUSCULAR | Status: AC
  Filled 2014-06-01: qty 30

## 2014-06-01 MED ORDER — MIDAZOLAM HCL 2 MG/2ML IJ SOLN
INTRAMUSCULAR | Status: AC
  Filled 2014-06-01: qty 2

## 2014-06-01 MED ORDER — HEPARIN (PORCINE) IN NACL 2-0.9 UNIT/ML-% IJ SOLN
INTRAMUSCULAR | Status: AC
  Filled 2014-06-01: qty 1500

## 2014-06-01 MED ORDER — VERAPAMIL HCL 2.5 MG/ML IV SOLN
INTRAVENOUS | Status: AC
  Filled 2014-06-01: qty 2

## 2014-06-01 MED ORDER — SODIUM CHLORIDE 0.9 % IV SOLN
1.0000 mL/kg/h | INTRAVENOUS | Status: DC
  Administered 2014-06-01: 1 mL/kg/h via INTRAVENOUS

## 2014-06-01 MED ORDER — APIXABAN 2.5 MG PO TABS: 2.5000 mg | ORAL_TABLET | Freq: Two times a day (BID) | ORAL | Status: DC

## 2014-06-01 MED ORDER — FENTANYL CITRATE 0.05 MG/ML IJ SOLN
INTRAMUSCULAR | Status: AC
  Filled 2014-06-01: qty 2

## 2014-06-01 NOTE — Interval H&P Note (Signed)
History and Physical Interval Note:  06/01/2014 8:59 AM  Maurice Wood  has presented today for surgery, with the diagnosis of cp  The various methods of treatment have been discussed with the patient and family. After consideration of risks, benefits and other options for treatment, the patient has consented to  Procedure(s): LEFT HEART CATHETERIZATION WITH CORONARY ANGIOGRAM (N/A) as a surgical intervention .  The patient's history has been reviewed, patient examined, no change in status, stable for surgery.  I have reviewed the patient's chart and labs.  Questions were answered to the patient's satisfaction.   Cath Lab Visit (complete for each Cath Lab visit)  Clinical Evaluation Leading to the Procedure:   ACS: yes  Non-ACS:    Anginal Classification: CCS IV  Anti-ischemic medical therapy: Minimal Therapy (1 class of medications)  Non-Invasive Test Results: Intermediate-risk stress test findings: cardiac mortality 1-3%/year  Prior CABG: No previous CABG        Maurice Wood [facility] 06/01/2014 8:59 AM

## 2014-06-01 NOTE — H&P (View-Only) (Signed)
    Subjective:  Denies CP or dyspnea   Objective:  Filed Vitals:   05/31/14 1020 05/31/14 1346 05/31/14 2047 06/01/14 0449  BP: 163/87 118/80 108/82 113/79  Pulse: 103 82 83 83  Temp:  97.9 F (36.6 C) 98 F (36.7 C) 97.7 F (36.5 C)  TempSrc:  Oral Oral Oral  Resp:  16 18 18   Height:      Weight:    191 lb (86.637 kg)  SpO2:  99% 97% 95%    Intake/Output from previous day: No intake or output data in the 24 hours ending 06/01/14 0705  Physical Exam: Physical exam: Well-developed well-nourished in no acute distress.  Skin is warm and dry.  HEENT is normal.  Neck is supple. Chest is clear to auscultation with normal expansion.  Cardiovascular exam is irregular Abdominal exam nontender or distended. No masses palpated. Extremities show no edema. neuro grossly intact    Lab Results: Basic Metabolic Panel:  Recent Labs  05/30/14 1010 05/30/14 1659 05/31/14 0335  NA 146  --  141  K 4.2  --  4.5  CL 110  --  107  CO2 24  --  24  GLUCOSE 87  --  136*  BUN 13  --  13  CREATININE 0.88  --  0.97  CALCIUM 9.7  --  9.0  MG  --  1.9  --    CBC:  Recent Labs  05/30/14 1010 05/31/14 0335  WBC 6.2 5.2  HGB 14.4 13.5  HCT 43.6 41.1  MCV 90.8 90.5  PLT 156 138*   Cardiac Enzymes:  Recent Labs  05/30/14 1659 05/30/14 2158 05/31/14 0335  TROPONINI <0.30 <0.30 <0.30     Assessment/Plan:  1 chest pain-Nuclear study shows inferior MI with mild peri-infarct ischemia; for cath today; risks and benefits discussed and patient agrees to proceed; followup with Dr. Haroldine Laws following DC. 2 atrial fibrillation-rate is controlled on no medications. Resume apixaban at DC.   Kirk Ruths 06/01/2014, 7:05 AM

## 2014-06-01 NOTE — CV Procedure (Signed)
    Cardiac Catheterization Procedure Note  Name: LYNDALL WINDT MRN: [id number] DOB: 01/06/1933  Procedure: Left Heart Cath, Selective Coronary Angiography, LV angiography  Indication: 80 yo WM presents with atypical chest pain. Nuclear stress test was abnormal showing inferior scar with peri-infarct ischemia.   Procedural Details: The right wrist was prepped, draped, and anesthetized with 1% lidocaine. Using the modified Seldinger technique, a 6 French slender sheath was introduced into the right radial artery. 3 mg of verapamil was administered through the sheath, weight-based unfractionated heparin was administered intravenously. Manipulation of catheters was very difficult due to a loop in the innominate artery. A Tig catheter was used. Catheter exchanges were performed over an exchange length guidewire. There were no immediate procedural complications. A TR band was used for radial hemostasis at the completion of the procedure.  The patient was transferred to the post catheterization recovery area for further monitoring.  Procedural Findings: Hemodynamics: AO 119/74 mean 95 mm Hg LV 118/17  Coronary angiography: Coronary dominance: left  Left mainstem: Normal  Left anterior descending (LAD): Normal.  Left circumflex (LCx): large dominant vessel. Normal.  Right coronary artery (RCA): small. Normal.  Left ventriculography: Not done  Final Conclusions:   1. Normal coronary anatomy.  Recommendations: DC home today. May resume Eliquis in am. If patient requires cardiac cath in the future would use left radial approach.  Peter Martinique, [place]  06/01/2014, 9:35 AM

## 2014-06-01 NOTE — Progress Notes (Signed)
Pts radial site stable and patient ready for discharge. Pt taken out in wheelchair to private vehicle with wife and son. Pt in stable condition.

## 2014-06-01 NOTE — ED Provider Notes (Signed)
Medical screening examination/treatment/procedure(s) were conducted as a shared visit with non-physician practitioner(s) and myself.  I personally evaluated the patient during the encounter.   EKG Interpretation   Date/Time:  Sunday May 30 2014 10:04:36 EDT Ventricular Rate:  77 PR Interval:    QRS Duration: 96 QT Interval:  390 QTC Calculation: 441 R Axis:   -11 Text Interpretation:  Atrial fibrillation Abnormal ECG ED PHYSICIAN  INTERPRETATION AVAILABLE IN [facility] Confirmed by TEST, Record  ([id number]) on 06/01/2014 7:17:57 AM      Pt with prior cardiac hx and intermittent CP lasting up to 1-2 hours for the last few weeks.  Pt currently CP free without other sx.  intial trop neg but pt needing admission for r/o.  Cards to see.  Blanchie Dessert, MD 06/01/14 [id number]

## 2014-06-01 NOTE — Discharge Summary (Signed)
Discharge Summary   Patient ID: Maurice Wood MRN: [id number], DOB/AGE: [birth year] 80 y.o. Admit date: 05/30/2014 D/C date:     06/01/2014  Primary Cardiologist: Dr. Haroldine Laws   Principal Problem:   Chest pain Active Problems:   DM type 2 (diabetes mellitus, type 2)   HYPERLIPIDEMIA   Atrial fibrillation, permanent   DISSECTION, CAROTID ARTERY   GERD   BENIGN PROSTATIC HYPERTROPHY   SLEEP APNEA, intolerant to CPAP    Long term current use of anticoagulant   Discharge Diagnosis: Chest pain- non cardiac origin s/p LHC with normal coronaries.  HPI: Maurice Wood is a 80 y.o. male with a history of chronic atrial fib on Eliquis, OSA- noncompliant with CPAP, hypertension, spontaneous dissection of the right ICA (2002),and small intracranial aneurysms who presented to [facility] on 05/30/14 with chest pain.  He had a similar episode of CP in January 2011 and underwent cath which showed normal coronaries with ef 45% (? artificially low due to AF). Echo 2/11: 60-65% . He described intermittent, sharp, left sided, anterior chest pain with radiation down his left arm for the past few days. No associated symptoms of nausea, SOB or diaphoresis. On the day of admission it was worse than usual and had gradually been increasing in severity so he decided to be evaluated in the ED.  Hospital Course: He was admitted for observation overnight and ruled out with serial negative cardiac enzymes and non-acute ECG. He underwent GXT in the AM which demonstrated inferolateral wall infarct with mild peri-infarct ischemia on stress image, EF 70%. He was scheduled for cardiac catheterization the following morning. LHC on 06/01/14 revealed normal coronary anatomy and he was cleared for discharge.   The patient has had an uncomplicated hospital course and is recovering well. The radial catheter site is stable. He has been seen by Dr. Martinique today and deemed ready for discharge home. He will follow up with Dr. Haroldine Laws as  previously scheduled as well as his PCP. Discharge medications are listed below. He may start his Eliquis tomorrow morning.   Note: If patient requires cardiac cath in the future would use left radial approach.  Discharge Vitals: Blood pressure 114/75, pulse 87, temperature 97.7 F (36.5 C), temperature source Oral, resp. rate 18, height 5\' 10"  (1.778 m), weight 191 lb (86.637 kg), SpO2 95.00%.  Labs: Lab Results  Component Value Date   WBC 5.2 05/31/2014   HGB 13.5 05/31/2014   HCT 41.1 05/31/2014   MCV 90.5 05/31/2014   PLT 138* 05/31/2014     Recent Labs Lab 05/31/14 0335  NA 141  K 4.5  CL 107  CO2 24  BUN 13  CREATININE 0.97  CALCIUM 9.0  GLUCOSE 136*    Recent Labs  05/30/14 1659 05/30/14 2158 05/31/14 0335  TROPONINI <0.30 <0.30 <0.30   Lab Results  Component Value Date   CHOL 166 02/24/2014   HDL 53.80 02/24/2014   LDLCALC 84 02/24/2014   TRIG 143.0 02/24/2014     Diagnostic Studies/Procedures   Dg Chest 2 View  05/30/2014   CLINICAL DATA:  Chest pain, LEFT arm pain, history atrial fibrillation, diabetes  EXAM: CHEST  2 VIEW  COMPARISON:  02/27/2014  FINDINGS: Borderline enlargement of cardiac silhouette.  Tortuous aorta.  Mediastinal contours and pulmonary vascularity normal.  Chronic peribronchial thickening and question minimal emphysematous changes.  No pulmonary infiltrate, pleural effusion or pneumothorax.  Chronic blunting of the LEFT lateral costophrenic angle unchanged.  IMPRESSION: Chronic bronchitic and  question emphysematous changes with LEFT basilar scarring.  No acute abnormalities.   Nm Myocar Multi W/spect W/wall Motion / Ef  05/31/2014   CLINICAL DATA:  Chest pain.  EXAM: MYOCARDIAL IMAGING WITH SPECT (REST AND EXERCISE)  GATED LEFT VENTRICULAR WALL MOTION STUDY  LEFT VENTRICULAR EJECTION FRACTION  TECHNIQUE: Standard myocardial SPECT imaging was performed after resting intravenous injection of 10 mCi Tc-60m sestamibi. Subsequently, exercise  tolerance test was performed by the patient under the supervision of the Cardiology staff. At peak-stress, 30 mCi Tc-50m sestamibi was injected intravenously and standard myocardial SPECT imaging was performed. Quantitative gated imaging was also performed to evaluate left ventricular wall motion, and estimate left ventricular ejection fraction.  COMPARISON:  Chest radiograph 05/30/2014.  FINDINGS: Treadmill stress test was performed. Patient exercised to 3 min 21 seconds.  There is a fixed defect in the inferior lateral wall on rest images the becomes more pronounced on stress images compatible with infarct in peri-infarct ischemia. This is in the mid to distal inferolateral wall. Mildly decreased wall motion is present in this region.  End-diastolic volume is 56 mL. End systolic volume is 17 mL. Calculated ejection fraction is 70%  IMPRESSION: 1. Inferolateral wall infarct with mild peri-infarct ischemia on stress images. 2. Calculated ejection fraction of 70%.      Cardiac Catheterization Procedure Note  Name: Maurice Wood  MRN: [id number]  DOB: 28-Oct-1933  Procedure: Left Heart Cath, Selective Coronary Angiography, LV angiography  Indication: 80 yo WM presents with atypical chest pain. Nuclear stress test was abnormal showing inferior scar with peri-infarct ischemia.  Procedural Details: The right wrist was prepped, draped, and anesthetized with 1% lidocaine. Using the modified Seldinger technique, a 6 French slender sheath was introduced into the right radial artery. 3 mg of verapamil was administered through the sheath, weight-based unfractionated heparin was administered intravenously. Manipulation of catheters was very difficult due to a loop in the innominate artery. A Tig catheter was used. Catheter exchanges were performed over an exchange length guidewire. There were no immediate procedural complications. A TR band was used for radial hemostasis at the completion of the procedure. The patient was  transferred to the post catheterization recovery area for further monitoring.  Procedural Findings:  Hemodynamics:  AO 119/74 mean 95 mm Hg  LV 118/17  Coronary angiography:  Coronary dominance: left  Left mainstem: Normal  Left anterior descending (LAD): Normal.  Left circumflex (LCx): large dominant vessel. Normal.  Right coronary artery (RCA): small. Normal.  Left ventriculography: Not done  Final Conclusions:  1. Normal coronary anatomy.  Recommendations: DC home today. May resume Eliquis in am. If patient requires cardiac cath in the future would use left radial approach.    Discharge Medications     Medication List         acetaminophen 500 MG tablet  Commonly known as:  TYLENOL  Take 1,000 mg by mouth every 6 (six) hours as needed.     apixaban 2.5 MG Tabs tablet  Commonly known as:  ELIQUIS  Take 1 tablet (2.5 mg total) by mouth 2 (two) times daily.  Start taking on:  06/02/2014     Calcium Carbonate-Vitamin D 600-200 MG-UNIT Tabs  Take 1 tablet by mouth daily.     cetirizine 10 MG tablet  Commonly known as:  ZYRTEC  Take 10 mg by mouth daily.     fluticasone 50 MCG/ACT nasal spray  Commonly known as:  FLONASE  Place 1 spray into both nostrils daily as needed  for allergies.     ipratropium 0.03 % nasal spray  Commonly known as:  ATROVENT  Place 2 sprays into both nostrils every 12 (twelve) hours as needed (for allergies).     multivitamin with minerals Tabs tablet  Take 1 tablet by mouth daily.     MYRBETRIQ 50 MG Tb24 tablet  Generic drug:  mirabegron ER  Take 50 mg by mouth daily.        Disposition   The patient will be discharged in stable condition to home.  Follow-up Information   Follow up with Glori Bickers, MD. (Please follow up with Dr. Haroldine Laws )    Specialty:  Cardiology   Contact information:   [place] [address] [phone]         Duration of Discharge Encounter: Greater than 30  minutes including physician and PA time.  SignedVertell Limber, Bitania Shankland PA-C 06/01/2014, 2:44 PM

## 2014-06-01 NOTE — Discharge Instructions (Signed)

## 2014-06-01 NOTE — Progress Notes (Signed)
    Subjective:  Denies CP or dyspnea   Objective:  Filed Vitals:   05/31/14 1020 05/31/14 1346 05/31/14 2047 06/01/14 0449  BP: 163/87 118/80 108/82 113/79  Pulse: 103 82 83 83  Temp:  97.9 F (36.6 C) 98 F (36.7 C) 97.7 F (36.5 C)  TempSrc:  Oral Oral Oral  Resp:  16 18 18   Height:      Weight:    191 lb (86.637 kg)  SpO2:  99% 97% 95%    Intake/Output from previous day: No intake or output data in the 24 hours ending 06/01/14 0705  Physical Exam: Physical exam: Well-developed well-nourished in no acute distress.  Skin is warm and dry.  HEENT is normal.  Neck is supple. Chest is clear to auscultation with normal expansion.  Cardiovascular exam is irregular Abdominal exam nontender or distended. No masses palpated. Extremities show no edema. neuro grossly intact    Lab Results: Basic Metabolic Panel:  Recent Labs  05/30/14 1010 05/30/14 1659 05/31/14 0335  NA 146  --  141  K 4.2  --  4.5  CL 110  --  107  CO2 24  --  24  GLUCOSE 87  --  136*  BUN 13  --  13  CREATININE 0.88  --  0.97  CALCIUM 9.7  --  9.0  MG  --  1.9  --    CBC:  Recent Labs  05/30/14 1010 05/31/14 0335  WBC 6.2 5.2  HGB 14.4 13.5  HCT 43.6 41.1  MCV 90.8 90.5  PLT 156 138*   Cardiac Enzymes:  Recent Labs  05/30/14 1659 05/30/14 2158 05/31/14 0335  TROPONINI <0.30 <0.30 <0.30     Assessment/Plan:  1 chest pain-Nuclear study shows inferior MI with mild peri-infarct ischemia; for cath today; risks and benefits discussed and patient agrees to proceed; followup with Dr. Haroldine Laws following DC. 2 atrial fibrillation-rate is controlled on no medications. Resume apixaban at DC.   Kirk Ruths 06/01/2014, 7:05 AM

## 2014-06-01 NOTE — Plan of Care (Signed)
Problem: Phase II Progression Outcomes Goal: Stress Test if indicated Outcome: Completed/Met Date Met:  06/01/14 Stress test performed on 05/31/2014. Goal: Cath/PCI Day Path if indicated Outcome: Progressing Planned cath at 0900 on 06/01/2014

## 2014-06-02 NOTE — Discharge Summary (Signed)
Patient seen and examined and history reviewed. Agree with above findings and plan. See cardiac cath report.  Peter Martinique, [place] 06/02/2014 7:20 AM

## 2014-06-17 ENCOUNTER — Other Ambulatory Visit: Payer: Self-pay | Admitting: Internal Medicine

## 2014-07-15 ENCOUNTER — Encounter: Payer: Self-pay | Admitting: Gastroenterology

## 2014-08-08 ENCOUNTER — Ambulatory Visit (INDEPENDENT_AMBULATORY_CARE_PROVIDER_SITE_OTHER): Payer: Medicare Other | Admitting: Family Medicine

## 2014-08-08 VITALS — BP 112/68 | HR 78 | Temp 97.5°F | Resp 16 | Ht 69.0 in | Wt 197.0 lb

## 2014-08-08 DIAGNOSIS — H6691 Otitis media, unspecified, right ear: Secondary | ICD-10-CM

## 2014-08-08 DIAGNOSIS — J309 Allergic rhinitis, unspecified: Secondary | ICD-10-CM

## 2014-08-08 DIAGNOSIS — H6690 Otitis media, unspecified, unspecified ear: Secondary | ICD-10-CM

## 2014-08-08 MED ORDER — MOMETASONE FUROATE 50 MCG/ACT NA SUSP: 2.0000 | Freq: Every day | NASAL | Status: DC

## 2014-08-08 MED ORDER — AMOXICILLIN 875 MG PO TABS: 875.0000 mg | ORAL_TABLET | Freq: Two times a day (BID) | ORAL | Status: DC

## 2014-08-08 MED ORDER — NEOMYCIN-POLYMYXIN-HC 3.5-10000-1 OT SOLN: 3.0000 [drp] | Freq: Three times a day (TID) | OTIC | Status: DC

## 2014-08-08 NOTE — Progress Notes (Signed)
Subjective:  This chart was scribed for [name], MD by Forrestine Him, [facility] Scribe. This patient was seen in room 2 and the patient's care was started 9:05 AM.    Patient ID: Maurice Wood, male    DOB: October 04, 1933, 80 y.o.   MRN: [id number]  Chief Complaint  Patient presents with   Otalgia    r ear    HPI HPI Comments: Maurice Wood is a 80 y.o. male with a PMHx of A-Fib, cerebral aneurysm, and hyperlipidemia who presents to [facility] complaining of constant, moderate R sided otalgia x 2-3 days that is unchanged. Pt also reports some mild dizziness onset last night. He reports gradual hearing loss which he states has been ongoing for some time now. At this time he denies any fever, congestion, sore throat, or chills. Pt with known allergies to clarithromycin, epinephrine, and nimodipine. No other concerns this visit.  Patient Active Problem List   Diagnosis Date Noted   H/O cardiac catheterizationn 12/2009 with normal coronary arteries 05/30/2014   Low back pain 12/17/2013   Chest pain 09/10/2013   Urinary incontinence 06/04/2011   Long term current use of anticoagulant 01/12/2011   DM type 2 (diabetes mellitus, type 2) 07/31/2010   HEMORRHOIDS-INTERNAL 06/27/2010   SLEEP APNEA, intolerant to CPAP  06/27/2010   DIVERTICULOSIS OF COLON 01/03/2010   GERD 11/22/2009   IRRITABLE BOWEL SYNDROME 11/22/2009   ERECTILE DYSFUNCTION 06/06/2009   BENIGN PROSTATIC HYPERTROPHY 06/18/2008   PSA, INCREASED 12/19/2007   OBSTRUCTIVE SLEEP APNEA 07/24/2007   DISSECTION, CAROTID ARTERY 07/24/2007   TESTOSTERONE DEFICIENCY 07/23/2007   HYPERLIPIDEMIA 07/23/2007   COLONIC POLYPS, HX OF 07/23/2007   Atrial fibrillation, permanent 07/14/2007   Past Medical History  Diagnosis Date   Atrial fibrillation Jan 2007    echo 1-07 normal ejection fraction, no significant valvular disease. adenosine cardiolite 1-07  no evidence of  ischemia. A 48 hour Holter monitor in 7-07 showed good rate controlw/ chronic atrial fibrillation. Cath 1/11 normal cors. EF 45%. Echo 2-11 60-65%   Obstructive sleep apnea     noncompliant with CPAP   Cerebral aneurysm     followed by Dr Estanislado Pandy   History of chest pain     a. s/p LHC 05/2014 with normal cors   Obesity    Fatigue    Allergy     rhinitis   Hx of colonic polyps     diverticulosis   Hyperlipidemia    Retinal vein occlusion    Benign prostatic hypertrophy    IBS (irritable bowel syndrome)    Incontinence     Per pt 12/14/11   Past Surgical History  Procedure Laterality Date   Cholecystectomy     Inguinal and umbilical herniorrhaphy     Tonsillectomy and adenoidectomy  as a child   Cardiac catheterization  1999   Hernia repair     Allergies  Allergen Reactions   Clarithromycin     REACTION: Headache   Epinephrine     REACTION: Increased Heart Rate   Nimodipine     REACTION: Flushing   Prior to Admission medications   Medication Sig Start Date End Date Taking? Authorizing Provider  acetaminophen (TYLENOL) 500 MG tablet Take 1,000 mg by mouth every 6 (six) hours as needed.   Yes Historical Provider, MD  Calcium Carbonate-Vitamin D (CALCIUM + D) 600-200 MG-UNIT TABS Take 1 tablet by mouth daily.    Yes Historical Provider, MD  cetirizine (ZYRTEC) 10 MG tablet Take 10 mg by mouth daily.   Yes Historical Provider, MD  ELIQUIS 2.5 MG TABS tablet TAKE 1 TABLET TWICE A DAY 06/17/14  Yes Jolaine Artist, MD  fluticasone (FLONASE) 50 MCG/ACT nasal spray Place 1 spray into both nostrils daily as needed for allergies.   Yes Historical Provider, MD  ipratropium (ATROVENT) 0.03 % nasal spray Place 2 sprays into both nostrils every 12 (twelve) hours as needed (for allergies).   Yes Historical Provider, MD  Multiple Vitamin (MULTIVITAMIN WITH MINERALS) TABS tablet Take 1 tablet by mouth daily.   Yes Historical Provider, MD  MYRBETRIQ 50 MG TB24 tablet  Take 50 mg by mouth daily. 05/06/14  Yes Historical Provider, MD    Review of Systems  Constitutional: Negative for fever and chills.  HENT: Positive for ear pain. Negative for congestion and sore throat.   Neurological: Positive for dizziness.    Triage Vitals: BP 112/68   Pulse 78   Temp(Src) 97.5 F (36.4 C) (Oral)   Resp 16   Ht 5\' 9"  (1.753 m)   Wt 197 lb (89.359 kg)   BMI 29.08 kg/m2   SpO2 93%  Objective:  Physical Exam  Nursing note and vitals reviewed. Constitutional: He is oriented to person, place, and time. He appears well-developed and well-nourished.  HENT:  Head: Normocephalic.  Right Ear: Tympanic membrane is retracted.  Left Ear: Tympanic membrane normal.  Eyes: EOM are normal.  Neck: Normal range of motion.  Cardiovascular: Normal rate, regular rhythm and normal heart sounds.   Pulmonary/Chest: Effort normal and breath sounds normal.  Abdominal: He exhibits no distension.  Musculoskeletal: Normal range of motion.  Neurological: He is alert and oriented to person, place, and time.  Psychiatric: He has a normal mood and affect.    Assessment & Plan:  I personally performed the services described in this documentation, which was scribed in my presence. The recorded information has been reviewed and is accurate.  Allergic rhinitis, unspecified allergic rhinitis type - Plan: mometasone (NASONEX) 50 MCG/ACT nasal spray  Otitis media not resolved, right - Plan: neomycin-polymyxin-hydrocortisone (CORTISPORIN) otic solution, amoxicillin (AMOXIL) 875 MG tablet  Signed, [name], MD

## 2014-08-12 ENCOUNTER — Telehealth: Payer: Self-pay

## 2014-08-12 ENCOUNTER — Ambulatory Visit (INDEPENDENT_AMBULATORY_CARE_PROVIDER_SITE_OTHER): Payer: Medicare Other | Admitting: Emergency Medicine

## 2014-08-12 VITALS — BP 100/70 | HR 88 | Temp 97.4°F | Resp 18 | Ht 69.25 in | Wt 197.8 lb

## 2014-08-12 DIAGNOSIS — H6981 Other specified disorders of Eustachian tube, right ear: Secondary | ICD-10-CM

## 2014-08-12 DIAGNOSIS — H6980 Other specified disorders of Eustachian tube, unspecified ear: Secondary | ICD-10-CM

## 2014-08-12 NOTE — Patient Instructions (Signed)
Barotitis Media Barotitis media is inflammation of your middle ear. This occurs when the auditory tube (eustachian tube) leading from the back of your nose (nasopharynx) to your eardrum is blocked. This blockage may result from a cold, environmental allergies, or an upper respiratory infection. Unresolved barotitis media may lead to damage or hearing loss (barotrauma), which may become permanent. HOME CARE INSTRUCTIONS   Use medicines as recommended by your health care provider. Over-the-counter medicines will help unblock the canal and can help during times of air travel.  Do not put anything into your ears to clean or unplug them. Eardrops will not be helpful.  Do not swim, dive, or fly until your health care provider says it is all right to do so. If these activities are necessary, chewing gum with frequent, forceful swallowing may help. It is also helpful to hold your nose and gently blow to pop your ears for equalizing pressure changes. This forces air into the eustachian tube.  Only take over-the-counter or prescription medicines for pain, discomfort, or fever as directed by your health care provider.  A decongestant may be helpful in decongesting the middle ear and make pressure equalization easier. SEEK MEDICAL CARE IF:  You experience a serious form of dizziness in which you feel as if the room is spinning and you feel nauseated (vertigo).  Your symptoms only involve one ear. SEEK IMMEDIATE MEDICAL CARE IF:   You develop a severe headache, dizziness, or severe ear pain.  You have bloody or pus-like drainage from your ears.  You develop a fever.  Your problems do not improve or become worse. MAKE SURE YOU:   Understand these instructions.  Will watch your condition.  Will get help right away if you are not doing well or get worse. Document Released: 11/23/2000 Document Revised: 09/16/2013 Document Reviewed: 06/23/2013 Exit[employer] Patient Information 2015 [employer]. This  information is not intended to replace advice given to you by your health care provider. Make sure you discuss any questions you have with your health care provider.  

## 2014-08-12 NOTE — Telephone Encounter (Signed)
Patient's wife called. States patient's ear is still bothering him. Please return call and advise them of what to do. CB # [phone]

## 2014-08-12 NOTE — Progress Notes (Signed)
[facility] [address] [place]  [address] [phone]  Date:  08/12/2014   Name:  Maurice Wood   DOB:  [birth year]   MRN:  [id number]  PCP:  Chancy Hurter, MD    Chief Complaint: Follow-up   History of Present Illness:  Maurice Wood is a 80 y.o. very pleasant male patient who presents with the following:  Patient has a pain in the right ear for the past 1 1/2 weeks.  No history of injury No coryza or cough, sore throat.  No fever or chills.  No nausea or vomiting.   Seen and treated over the weekend with amoxicillin and cortisporin.  Has no improvement.   Says has pain and "noise" in the ear. No improvement with over the counter medications or other home remedies. Denies other complaint or health concern today.   Patient Active Problem List   Diagnosis Date Noted  . H/O cardiac catheterizationn 12/2009 with normal coronary arteries 05/30/2014  . Low back pain 12/17/2013  . Chest pain 09/10/2013  . Urinary incontinence 06/04/2011  . Long term current use of anticoagulant 01/12/2011  . DM type 2 (diabetes mellitus, type 2) 07/31/2010  . HEMORRHOIDS-INTERNAL 06/27/2010  . SLEEP APNEA, intolerant to CPAP  06/27/2010  . DIVERTICULOSIS OF COLON 01/03/2010  . GERD 11/22/2009  . IRRITABLE BOWEL SYNDROME 11/22/2009  . ERECTILE DYSFUNCTION 06/06/2009  . BENIGN PROSTATIC HYPERTROPHY 06/18/2008  . PSA, INCREASED 12/19/2007  . OBSTRUCTIVE SLEEP APNEA 07/24/2007  . DISSECTION, CAROTID ARTERY 07/24/2007  . TESTOSTERONE DEFICIENCY 07/23/2007  . HYPERLIPIDEMIA 07/23/2007  . COLONIC POLYPS, HX OF 07/23/2007  . Atrial fibrillation, permanent 07/14/2007    Past Medical History  Diagnosis Date  . Atrial fibrillation Jan 2007    echo 1-07 normal ejection fraction, no significant valvular disease. adenosine cardiolite 1-07  no evidence of ischemia. A 48 hour Holter monitor in 7-07 showed good rate controlw/ chronic atrial fibrillation. Cath 1/11 normal cors.  EF 45%. Echo 2-11 60-65%  . Obstructive sleep apnea     noncompliant with CPAP  . Cerebral aneurysm     followed by Dr Estanislado Pandy  . History of chest pain     a. s/p LHC 05/2014 with normal cors  . Obesity   . Fatigue   . Allergy     rhinitis  . Hx of colonic polyps     diverticulosis  . Hyperlipidemia   . Retinal vein occlusion   . Benign prostatic hypertrophy   . IBS (irritable bowel syndrome)   . Incontinence     Per pt 12/14/11    Past Surgical History  Procedure Laterality Date  . Cholecystectomy    . Inguinal and umbilical herniorrhaphy    . Tonsillectomy and adenoidectomy  as a child  . Cardiac catheterization  1999  . Hernia repair      History  Substance Use Topics  . Smoking status: Former Smoker    Types: Cigarettes  . Smokeless tobacco: Never Used     Comment: quit 1973  . Alcohol Use: 0.6 oz/week    1 Cans of beer per week     Comment: occ    Family History  Problem Relation Age of Onset  . Cancer Father     lung  . Alcohol abuse Father   . Aneurysm Father     Aortic aneurysm  . Diabetes Son   . Cancer Maternal Grandmother     colon  . Diabetes Maternal Uncle   .  Diabetes Paternal Uncle   . Sudden death Mother   . Heart disease Paternal Grandfather     Allergies  Allergen Reactions  . Clarithromycin     REACTION: Headache  . Epinephrine     REACTION: Increased Heart Rate  . Nimodipine     REACTION: Flushing    Medication list has been reviewed and updated.  Current Outpatient Prescriptions on File Prior to Visit  Medication Sig Dispense Refill  . acetaminophen (TYLENOL) 500 MG tablet Take 1,000 mg by mouth every 6 (six) hours as needed.      Marland Kitchen amoxicillin (AMOXIL) 875 MG tablet Take 1 tablet (875 mg total) by mouth 2 (two) times daily.  14 tablet  0  . Calcium Carbonate-Vitamin D (CALCIUM + D) 600-200 MG-UNIT TABS Take 1 tablet by mouth daily.       . cetirizine (ZYRTEC) 10 MG tablet Take 10 mg by mouth daily.      Marland Kitchen ELIQUIS 2.5 MG  TABS tablet TAKE 1 TABLET TWICE A DAY  60 tablet  12  . mometasone (NASONEX) 50 MCG/ACT nasal spray Place 2 sprays into the nose daily.  17 g  12  . Multiple Vitamin (MULTIVITAMIN WITH MINERALS) TABS tablet Take 1 tablet by mouth daily.      Marland Kitchen MYRBETRIQ 50 MG TB24 tablet Take 50 mg by mouth daily.      Marland Kitchen neomycin-polymyxin-hydrocortisone (CORTISPORIN) otic solution Place 3 drops into the right ear 3 (three) times daily.  10 mL  0  . fluticasone (FLONASE) 50 MCG/ACT nasal spray Place 1 spray into both nostrils daily as needed for allergies.      Marland Kitchen ipratropium (ATROVENT) 0.03 % nasal spray Place 2 sprays into both nostrils every 12 (twelve) hours as needed (for allergies).       No current facility-administered medications on file prior to visit.    Review of Systems:  As per HPI, otherwise negative.    Physical Examination: Filed Vitals:   08/12/14 1733  BP: 100/70  Pulse: 88  Temp: 97.4 F (36.3 C)  Resp: 18   Filed Vitals:   08/12/14 1733  Height: 5' 9.25" (1.759 m)  Weight: 197 lb 12.8 oz (89.721 kg)   Body mass index is 29 kg/(m^2). Ideal Body Weight: Weight in (lb) to have BMI = 25: 170.2   GEN: WDWN, NAD, Non-toxic, Alert & Oriented x 3 HEENT: Atraumatic, Normocephalic.  Ears and Nose: No external deformity.  Retracted right drum EXTR: No clubbing/cyanosis/edema NEURO: Normal gait.  PSYCH: Normally interactive. Conversant. Not depressed or anxious appearing.  Calm demeanor.    Assessment and Plan: Eustachian tube dysfunction Coricidin HBP ENT   Signed,  Ellison Carwin, MD

## 2014-08-12 NOTE — Telephone Encounter (Signed)
Spoke to pt and advised to RTC- states he is on the way

## 2014-08-25 ENCOUNTER — Ambulatory Visit: Payer: Medicare Other | Admitting: Internal Medicine

## 2014-08-30 ENCOUNTER — Ambulatory Visit (INDEPENDENT_AMBULATORY_CARE_PROVIDER_SITE_OTHER): Payer: Medicare Other

## 2014-08-30 DIAGNOSIS — Z23 Encounter for immunization: Secondary | ICD-10-CM

## 2014-08-30 NOTE — Addendum Note (Signed)
Addended by: Valerie Salts on: 08/30/2014 04:05 PM   Modules accepted: Level of Service

## 2014-10-05 ENCOUNTER — Telehealth (HOSPITAL_COMMUNITY): Payer: Self-pay | Admitting: Vascular Surgery

## 2014-10-05 NOTE — Telephone Encounter (Signed)
PT is having a tooth removed pt is on Eliquis pt wants to know what they need to do..please advise

## 2014-10-06 NOTE — Telephone Encounter (Signed)
Will send to Dr [name] for review 

## 2014-10-07 NOTE — Telephone Encounter (Signed)
Hold eliquis for 24 hours prior to procedure. Can start back the night after the procedure

## 2014-10-07 NOTE — Telephone Encounter (Signed)
Pt's wife aware and agreeable 

## 2014-10-27 ENCOUNTER — Encounter: Payer: Self-pay | Admitting: Cardiology

## 2014-10-27 ENCOUNTER — Encounter: Payer: Self-pay | Admitting: Internal Medicine

## 2014-11-02 ENCOUNTER — Encounter: Payer: Self-pay | Admitting: Internal Medicine

## 2014-11-03 ENCOUNTER — Encounter: Payer: Self-pay | Admitting: Pulmonary Disease

## 2014-11-18 ENCOUNTER — Encounter (HOSPITAL_COMMUNITY): Payer: Self-pay | Admitting: Cardiology

## 2015-01-27 ENCOUNTER — Ambulatory Visit: Payer: Medicare Other | Admitting: Family Medicine

## 2015-03-14 ENCOUNTER — Other Ambulatory Visit: Payer: Self-pay | Admitting: Otolaryngology

## 2015-03-14 DIAGNOSIS — H9121 Sudden idiopathic hearing loss, right ear: Secondary | ICD-10-CM

## 2015-03-14 DIAGNOSIS — H9313 Tinnitus, bilateral: Secondary | ICD-10-CM

## 2015-03-14 DIAGNOSIS — H918X9 Other specified hearing loss, unspecified ear: Secondary | ICD-10-CM

## 2015-03-24 ENCOUNTER — Ambulatory Visit
Admission: RE | Admit: 2015-03-24 | Discharge: 2015-03-24 | Disposition: A | Discharge status: Home / Self Care | Payer: PPO | Source: Ambulatory Visit | Attending: Otolaryngology | Admitting: Otolaryngology

## 2015-03-24 DIAGNOSIS — H9121 Sudden idiopathic hearing loss, right ear: Secondary | ICD-10-CM

## 2015-03-24 DIAGNOSIS — H9313 Tinnitus, bilateral: Secondary | ICD-10-CM

## 2015-03-24 DIAGNOSIS — H918X9 Other specified hearing loss, unspecified ear: Secondary | ICD-10-CM

## 2015-03-24 MED ORDER — GADOBENATE DIMEGLUMINE 529 MG/ML IV SOLN: 18.0000 mL | Freq: Once | INTRAVENOUS | Status: AC | PRN

## 2015-04-21 ENCOUNTER — Telehealth: Payer: Self-pay | Admitting: Internal Medicine

## 2015-04-25 NOTE — Telephone Encounter (Signed)
Message left on Medical Records Voicemail. Pt's wife calling on behalf of pt to inquire if pt has ever had prevnar(pneumonia) vaccine. If so, what date.  Pt has transferred care to [facility] and needs to provide this information to his new doctor.

## 2015-05-03 NOTE — Telephone Encounter (Signed)
Left message for pt to call back  °

## 2015-05-03 NOTE — Telephone Encounter (Signed)
Spoke with pt's wife to advise that pt has not had a prevnar 13 but did have the pneumococcal 23.

## 2015-05-25 ENCOUNTER — Emergency Department (HOSPITAL_COMMUNITY): Payer: PPO

## 2015-05-25 ENCOUNTER — Inpatient Hospital Stay (HOSPITAL_COMMUNITY)
Admission: EM | Admit: 2015-05-25 | Discharge: 2015-05-28 | DRG: 871 | Disposition: A | Discharge status: Home Health | Payer: PPO | Attending: Internal Medicine | Admitting: Internal Medicine

## 2015-05-25 ENCOUNTER — Inpatient Hospital Stay (HOSPITAL_COMMUNITY): Payer: PPO

## 2015-05-25 ENCOUNTER — Encounter (HOSPITAL_COMMUNITY): Payer: Self-pay | Admitting: Family Medicine

## 2015-05-25 DIAGNOSIS — Z881 Allergy status to other antibiotic agents status: Secondary | ICD-10-CM | POA: Diagnosis not present

## 2015-05-25 DIAGNOSIS — I482 Chronic atrial fibrillation: Secondary | ICD-10-CM | POA: Diagnosis present

## 2015-05-25 DIAGNOSIS — R05 Cough: Secondary | ICD-10-CM

## 2015-05-25 DIAGNOSIS — N400 Enlarged prostate without lower urinary tract symptoms: Secondary | ICD-10-CM | POA: Diagnosis not present

## 2015-05-25 DIAGNOSIS — Z87891 Personal history of nicotine dependence: Secondary | ICD-10-CM

## 2015-05-25 DIAGNOSIS — N390 Urinary tract infection, site not specified: Secondary | ICD-10-CM | POA: Diagnosis not present

## 2015-05-25 DIAGNOSIS — N179 Acute kidney failure, unspecified: Secondary | ICD-10-CM | POA: Diagnosis not present

## 2015-05-25 DIAGNOSIS — R509 Fever, unspecified: Secondary | ICD-10-CM | POA: Diagnosis not present

## 2015-05-25 DIAGNOSIS — D6959 Other secondary thrombocytopenia: Secondary | ICD-10-CM | POA: Diagnosis present

## 2015-05-25 DIAGNOSIS — G9340 Encephalopathy, unspecified: Secondary | ICD-10-CM | POA: Diagnosis not present

## 2015-05-25 DIAGNOSIS — R338 Other retention of urine: Secondary | ICD-10-CM | POA: Insufficient documentation

## 2015-05-25 DIAGNOSIS — Z9049 Acquired absence of other specified parts of digestive tract: Secondary | ICD-10-CM | POA: Diagnosis present

## 2015-05-25 DIAGNOSIS — Z7951 Long term (current) use of inhaled steroids: Secondary | ICD-10-CM | POA: Diagnosis not present

## 2015-05-25 DIAGNOSIS — E785 Hyperlipidemia, unspecified: Secondary | ICD-10-CM | POA: Diagnosis present

## 2015-05-25 DIAGNOSIS — H6691 Otitis media, unspecified, right ear: Secondary | ICD-10-CM

## 2015-05-25 DIAGNOSIS — R059 Cough, unspecified: Secondary | ICD-10-CM | POA: Diagnosis present

## 2015-05-25 DIAGNOSIS — A419 Sepsis, unspecified organism: Secondary | ICD-10-CM | POA: Diagnosis present

## 2015-05-25 DIAGNOSIS — Z87442 Personal history of urinary calculi: Secondary | ICD-10-CM | POA: Diagnosis not present

## 2015-05-25 DIAGNOSIS — G4733 Obstructive sleep apnea (adult) (pediatric): Secondary | ICD-10-CM | POA: Diagnosis present

## 2015-05-25 DIAGNOSIS — Z9119 Patient's noncompliance with other medical treatment and regimen: Secondary | ICD-10-CM | POA: Diagnosis present

## 2015-05-25 DIAGNOSIS — K589 Irritable bowel syndrome without diarrhea: Secondary | ICD-10-CM | POA: Diagnosis present

## 2015-05-25 DIAGNOSIS — G4730 Sleep apnea, unspecified: Secondary | ICD-10-CM | POA: Diagnosis present

## 2015-05-25 DIAGNOSIS — N12 Tubulo-interstitial nephritis, not specified as acute or chronic: Secondary | ICD-10-CM | POA: Diagnosis present

## 2015-05-25 DIAGNOSIS — Z7901 Long term (current) use of anticoagulants: Secondary | ICD-10-CM | POA: Diagnosis not present

## 2015-05-25 DIAGNOSIS — E876 Hypokalemia: Secondary | ICD-10-CM | POA: Diagnosis present

## 2015-05-25 DIAGNOSIS — R339 Retention of urine, unspecified: Secondary | ICD-10-CM | POA: Diagnosis present

## 2015-05-25 DIAGNOSIS — Z888 Allergy status to other drugs, medicaments and biological substances status: Secondary | ICD-10-CM

## 2015-05-25 DIAGNOSIS — N401 Enlarged prostate with lower urinary tract symptoms: Secondary | ICD-10-CM | POA: Diagnosis present

## 2015-05-25 DIAGNOSIS — N200 Calculus of kidney: Secondary | ICD-10-CM

## 2015-05-25 DIAGNOSIS — I4821 Permanent atrial fibrillation: Secondary | ICD-10-CM | POA: Diagnosis present

## 2015-05-25 HISTORY — DX: Calculus of kidney: N20.0

## 2015-05-25 LAB — CBC WITH DIFFERENTIAL/PLATELET
Basophils Absolute: 0 10*3/uL (ref 0.0–0.1)
Basophils Relative: 0 % (ref 0–1)
Eosinophils Absolute: 0 10*3/uL (ref 0.0–0.7)
Eosinophils Relative: 0 % (ref 0–5)
HCT: 40.4 % (ref 39.0–52.0)
HEMOGLOBIN: 13.1 g/dL (ref 13.0–17.0)
LYMPHS ABS: 0.4 10*3/uL — AB (ref 0.7–4.0)
LYMPHS PCT: 4 % — AB (ref 12–46)
MCH: 28.9 pg (ref 26.0–34.0)
MCHC: 32.4 g/dL (ref 30.0–36.0)
MCV: 89 fL (ref 78.0–100.0)
Monocytes Absolute: 0.8 10*3/uL (ref 0.1–1.0)
Monocytes Relative: 7 % (ref 3–12)
Neutro Abs: 10.5 10*3/uL — ABNORMAL HIGH (ref 1.7–7.7)
Neutrophils Relative %: 89 % — ABNORMAL HIGH (ref 43–77)
PLATELETS: 127 10*3/uL — AB (ref 150–400)
RBC: 4.54 MIL/uL (ref 4.22–5.81)
RDW: 14 % (ref 11.5–15.5)
WBC: 11.8 10*3/uL — AB (ref 4.0–10.5)

## 2015-05-25 LAB — URINE MICROSCOPIC-ADD ON

## 2015-05-25 LAB — URINALYSIS, ROUTINE W REFLEX MICROSCOPIC
Bilirubin Urine: NEGATIVE
Glucose, UA: NEGATIVE mg/dL
Ketones, ur: 15 mg/dL — AB
Nitrite: NEGATIVE
PROTEIN: 100 mg/dL — AB
Specific Gravity, Urine: 1.019 (ref 1.005–1.030)
Urobilinogen, UA: 0.2 mg/dL (ref 0.0–1.0)
pH: 6 (ref 5.0–8.0)

## 2015-05-25 LAB — PROTIME-INR
INR: 1.44 (ref 0.00–1.49)
Prothrombin Time: 17.7 seconds — ABNORMAL HIGH (ref 11.6–15.2)

## 2015-05-25 LAB — GLUCOSE, CAPILLARY: Glucose-Capillary: 155 mg/dL — ABNORMAL HIGH (ref 65–99)

## 2015-05-25 LAB — I-STAT CHEM 8, ED
BUN: 7 mg/dL (ref 6–20)
CALCIUM ION: 1.17 mmol/L (ref 1.13–1.30)
CHLORIDE: 104 mmol/L (ref 101–111)
CREATININE: 1 mg/dL (ref 0.61–1.24)
GLUCOSE: 160 mg/dL — AB (ref 65–99)
HCT: 42 % (ref 39.0–52.0)
HEMOGLOBIN: 14.3 g/dL (ref 13.0–17.0)
Potassium: 3.3 mmol/L — ABNORMAL LOW (ref 3.5–5.1)
Sodium: 138 mmol/L (ref 135–145)
TCO2: 19 mmol/L (ref 0–100)

## 2015-05-25 LAB — PROCALCITONIN: Procalcitonin: 0.2 ng/mL

## 2015-05-25 LAB — I-STAT CG4 LACTIC ACID, ED
Lactic Acid, Venous: 0.88 mmol/L (ref 0.5–2.0)
Lactic Acid, Venous: 1.91 mmol/L (ref 0.5–2.0)

## 2015-05-25 LAB — APTT: aPTT: 33 seconds (ref 24–37)

## 2015-05-25 MED ORDER — VANCOMYCIN HCL IN DEXTROSE 1-5 GM/200ML-% IV SOLN
1000.0000 mg | Freq: Once | INTRAVENOUS | Status: AC
  Administered 2015-05-25: 1000 mg via INTRAVENOUS
  Filled 2015-05-25: qty 200

## 2015-05-25 MED ORDER — TAMSULOSIN HCL 0.4 MG PO CAPS
0.4000 mg | ORAL_CAPSULE | Freq: Every day | ORAL | Status: DC
  Administered 2015-05-25 – 2015-05-28 (×4): 0.4 mg via ORAL
  Filled 2015-05-25 (×4): qty 1

## 2015-05-25 MED ORDER — LORATADINE 10 MG PO TABS
10.0000 mg | ORAL_TABLET | Freq: Every day | ORAL | Status: DC
  Administered 2015-05-25 – 2015-05-28 (×4): 10 mg via ORAL
  Filled 2015-05-25 (×4): qty 1

## 2015-05-25 MED ORDER — POTASSIUM CHLORIDE CRYS ER 20 MEQ PO TBCR
20.0000 meq | EXTENDED_RELEASE_TABLET | Freq: Once | ORAL | Status: AC
  Administered 2015-05-25: 20 meq via ORAL
  Filled 2015-05-25: qty 1

## 2015-05-25 MED ORDER — SODIUM CHLORIDE 0.9 % IV BOLUS (SEPSIS)
1000.0000 mL | Freq: Once | INTRAVENOUS | Status: AC
  Administered 2015-05-25: 1000 mL via INTRAVENOUS

## 2015-05-25 MED ORDER — OXYCODONE-ACETAMINOPHEN 5-325 MG PO TABS: 1.0000 | ORAL_TABLET | ORAL | Status: DC | PRN

## 2015-05-25 MED ORDER — ONDANSETRON HCL 4 MG PO TABS: 4.0000 mg | ORAL_TABLET | Freq: Four times a day (QID) | ORAL | Status: DC | PRN

## 2015-05-25 MED ORDER — SODIUM CHLORIDE 0.9 % IJ SOLN
3.0000 mL | Freq: Two times a day (BID) | INTRAMUSCULAR | Status: DC
  Administered 2015-05-25 – 2015-05-27 (×2): 3 mL via INTRAVENOUS

## 2015-05-25 MED ORDER — APIXABAN 2.5 MG PO TABS
2.5000 mg | ORAL_TABLET | Freq: Two times a day (BID) | ORAL | Status: DC
  Administered 2015-05-25 – 2015-05-28 (×7): 2.5 mg via ORAL
  Filled 2015-05-25 (×7): qty 1

## 2015-05-25 MED ORDER — MORPHINE SULFATE 2 MG/ML IJ SOLN
2.0000 mg | INTRAMUSCULAR | Status: DC | PRN
Start: 2015-05-25 — End: 2015-05-28

## 2015-05-25 MED ORDER — ACETAMINOPHEN 500 MG PO TABS
1000.0000 mg | ORAL_TABLET | Freq: Once | ORAL | Status: AC
  Administered 2015-05-25: 1000 mg via ORAL
  Filled 2015-05-25: qty 2

## 2015-05-25 MED ORDER — FLUTICASONE PROPIONATE 50 MCG/ACT NA SUSP
1.0000 | Freq: Every day | NASAL | Status: DC | PRN
  Filled 2015-05-25: qty 16

## 2015-05-25 MED ORDER — FLUTICASONE PROPIONATE 50 MCG/ACT NA SUSP
1.0000 | Freq: Every day | NASAL | Status: DC | PRN
  Administered 2015-05-25: 1 via NASAL
  Filled 2015-05-25 (×2): qty 16

## 2015-05-25 MED ORDER — DM-GUAIFENESIN ER 30-600 MG PO TB12
1.0000 | ORAL_TABLET | Freq: Two times a day (BID) | ORAL | Status: DC
  Administered 2015-05-25 – 2015-05-28 (×6): 1 via ORAL
  Filled 2015-05-25 (×7): qty 1

## 2015-05-25 MED ORDER — PIPERACILLIN-TAZOBACTAM 3.375 G IVPB
3.3750 g | Freq: Three times a day (TID) | INTRAVENOUS | Status: DC
  Administered 2015-05-25 – 2015-05-27 (×7): 3.375 g via INTRAVENOUS
  Filled 2015-05-25 (×7): qty 50

## 2015-05-25 MED ORDER — VANCOMYCIN HCL 10 G IV SOLR
1250.0000 mg | Freq: Two times a day (BID) | INTRAVENOUS | Status: DC
  Administered 2015-05-25 – 2015-05-26 (×3): 1250 mg via INTRAVENOUS
  Filled 2015-05-25 (×3): qty 1250

## 2015-05-25 MED ORDER — ONDANSETRON HCL 4 MG/2ML IJ SOLN
4.0000 mg | Freq: Four times a day (QID) | INTRAMUSCULAR | Status: DC | PRN
  Administered 2015-05-25 – 2015-05-28 (×3): 4 mg via INTRAVENOUS
  Filled 2015-05-25 (×4): qty 2

## 2015-05-25 MED ORDER — PIPERACILLIN-TAZOBACTAM 3.375 G IVPB 30 MIN
3.3750 g | Freq: Once | INTRAVENOUS | Status: AC
  Administered 2015-05-25: 3.375 g via INTRAVENOUS
  Filled 2015-05-25: qty 50

## 2015-05-25 MED ORDER — ACETAMINOPHEN 325 MG PO TABS
650.0000 mg | ORAL_TABLET | Freq: Three times a day (TID) | ORAL | Status: DC | PRN
  Administered 2015-05-25 – 2015-05-27 (×2): 650 mg via ORAL
  Filled 2015-05-25 (×2): qty 2

## 2015-05-25 MED ORDER — SODIUM CHLORIDE 0.9 % IV SOLN
INTRAVENOUS | Status: DC
  Administered 2015-05-25 – 2015-05-26 (×3): via INTRAVENOUS

## 2015-05-25 NOTE — Evaluation (Signed)
Physical Therapy Evaluation Patient Details Name: Maurice Wood MRN: [id number] DOB: 1933-04-05 Today's Date: 05/25/2015   History of Present Illness  81 y.o. male with PMH of hyperlipidemia, atrial fibrillation, BPH, IBS with chronic mild diarrhea, kidney stone(s/p of right lithotripsy on 05/23/15)and admitted with sepsis secondary to UTI/pyelonephritis  Clinical Impression  Pt admitted with above diagnosis. Pt currently with functional limitations due to the deficits listed below (see PT Problem List).  Pt will benefit from skilled PT to increase their independence and safety with mobility to allow discharge to the venue listed below.   Pt with decreased cognition and currently requiring max cues for safety and technique.  Pt's SPO2 dropped to 86% upon sitting EOB on room air so reapplied 2L O2 [place].  Pt also with bil UE tremors upon sitting after ambulating however ceased upon returning to supine.   Pt and family hopeful for pt to progress to home.  Will keep recommendations updated.     Follow Up Recommendations SNF;Supervision/Assistance - 24 hour    Equipment Recommendations  Rolling walker with 5" wheels;3in1 (PT)    Recommendations for Other Services       Precautions / Restrictions Precautions Precautions: Fall Precaution Comments: monitor sats      Mobility  Bed Mobility Overal bed mobility: Needs Assistance Bed Mobility: Supine to Sit     Supine to sit: Mod assist;HOB elevated     General bed mobility comments: verbal and tactile cues for technique, pt just awakened upon entering room, requiring increased time, also appears confused, assist for trunk upright and LEs over EOB, SPO2 dropped to 86% on room air so reapplied 2L O2  Transfers Overall transfer level: Needs assistance Equipment used: Rolling walker (2 wheeled) Transfers: Sit to/from Stand Sit to Stand: Mod assist         General transfer comment: verbal cues for technique, assist to rise and steady as  well as control descent  Ambulation/Gait Ambulation/Gait assistance: Min assist Ambulation Distance (Feet): 80 Feet Assistive device: Rolling walker (2 wheeled) Gait Pattern/deviations: Step-through pattern;Wide base of support;Trunk flexed     General Gait Details: verbal cues for using RW, assist to steady, assist for maneuvering RW, maintained 2L O2 [place] with SPO2 93% during gait, poor control of gait  Stairs            Wheelchair Mobility    Modified Rankin (Stroke Patients Only)       Balance Overall balance assessment: Needs assistance         Standing balance support: Bilateral upper extremity supported Standing balance-Leahy Scale: Poor                               Pertinent Vitals/Pain Pain Assessment: No/denies pain    Home Living Family/patient expects to be discharged to:: Private residence Living Arrangements: Spouse/significant other Available Help at Discharge: Family Type of Home: House Home Access: Stairs to enter Entrance Stairs-Rails: None Entrance Stairs-Number of Steps: 1 Home Layout: One level Home Equipment: Cane - single point      Prior Function Level of Independence: Independent               Hand Dominance        Extremity/Trunk Assessment               Lower Extremity Assessment: Generalized weakness         Communication   Communication: No difficulties  Cognition Arousal/Alertness:  Lethargic Behavior During Therapy: Restless Overall Cognitive Status: Impaired/Different from baseline Area of Impairment: Following commands;Safety/judgement       Following Commands: Follows one step commands inconsistently;Follows one step commands with increased time Safety/Judgement: Decreased awareness of safety;Decreased awareness of deficits     General Comments: appears confused, family reports decreased cognition from baseline    General Comments      Exercises        Assessment/Plan     PT Assessment Patient needs continued PT services  PT Diagnosis Difficulty walking;Altered mental status   PT Problem List Decreased strength;Decreased activity tolerance;Decreased mobility;Decreased balance;Decreased knowledge of use of DME;Decreased cognition;Cardiopulmonary status limiting activity  PT Treatment Interventions DME instruction;Gait training;Functional mobility training;Patient/family education;Therapeutic activities;Therapeutic exercise;Balance training   PT Goals (Current goals can be found in the Care Plan section) Acute Rehab PT Goals Patient Stated Goal: family would like pt to progress to d/c home PT Goal Formulation: With patient/family Time For Goal Achievement: 06/01/15 Potential to Achieve Goals: Good    Frequency Min 3X/week   Barriers to discharge        Co-evaluation               End of Session Equipment Utilized During Treatment: Gait belt;Oxygen Activity Tolerance: Other (comment);Patient limited by fatigue (limited by cognition) Patient left: in bed;with call bell/phone within reach;with bed alarm set;with family/visitor present Nurse Communication: Mobility status         Time: 9747-1855 PT Time Calculation (min) (ACUTE ONLY): 25 min   Charges:   PT Evaluation $Initial PT Evaluation Tier I: 1 Procedure     PT G Codes:        Stefanie Hodgens,[name] 05/25/2015, 3:52 PM Carmelia Bake, PT, DPT 05/25/2015 Pager: [phone]

## 2015-05-25 NOTE — Clinical Social Work Placement (Signed)
   CLINICAL SOCIAL WORK PLACEMENT  NOTE  Date:  05/25/2015  Patient Details  Name: Maurice Wood MRN: [id number] Date of Birth: 23-Jun-1933  Clinical Social Work is seeking post-discharge placement for this patient at the [place] level of care (*CSW will initial, date and re-position this form in  chart as items are completed):  Yes   Patient/family provided with [place] Work Department's list of facilities offering this level of care within the geographic area requested by the patient (or if unable, by the patient's family).  Yes   Patient/family informed of their freedom to choose among providers that offer the needed level of care, that participate in Medicare, Medicaid or managed care program needed by the patient, have an available bed and are willing to accept the patient.  Yes   Patient/family informed of [place]'s ownership interest in  [facility], as well as of the fact that they are under no obligation to receive care at these facilities.  PASRR submitted to EDS on 05/25/15     PASRR number received on 05/25/15     Existing PASRR number confirmed on       FL2 transmitted to all facilities in geographic area requested by pt/family on 05/25/15     FL2 transmitted to all facilities within larger geographic area on       Patient informed that his/her managed care company has contracts with or will negotiate with certain facilities, including the following:        Yes   Patient/family informed of bed offers received.  Patient chooses bed at [facility]     Physician recommends and patient chooses bed at      Patient to be transferred to   on  .  Patient to be transferred to facility by       Patient family notified on   of transfer.  Name of family member notified:        PHYSICIAN       Additional Comment:    _______________________________________________ Standley Brooking, LCSW 05/25/2015, 3:47  PM

## 2015-05-25 NOTE — Progress Notes (Signed)
PATIENT DETAILS Name: Maurice Wood Age: 81 y.o. Sex: male Date of Birth: Oct 02, 1933 Admit Date: 05/25/2015 Admitting Physician Ivor Costa, MD SWN:[id number] [name], MD  Subjective: Lethargic, but awake and alert. Answers most questions appropriately. Son and spouse at bedside.  Assessment/Plan: Principal Problem:   Sepsis: Secondary to UTI/pyelonephritis. Recently treated for enterococcus UTI, and also underwent right ureteral stent placement (5/26) along with lithotripsy for a large right renal calculi (6/13). Will continue with both vancomycin and Zosyn until culture data is available. Follow fever curve, but sepsis pathophysiology seems to have improved  Active Problems: Probable pyelonephritis: See above.  Acute encephalopathy: Much more awake and alert than on initial presentation. Likely secondary to above. Continue with IV antibiotics and follow clinical course  Acute urinary retention: Continue Flomax, continue Foley catheter. May need a voiding trial prior to discharge.  Hypokalemia: Replete and recheck  Atrial fibrillation:CHA2DS2-VASc Score is 2- continue Eliquis area and not on any rate controlling agents. Monitor in telemetry  Disposition: Remain inpatient  Antimicrobial agents  See below  Anti-infectives    Start     Dose/Rate Route Frequency Ordered Stop   05/25/15 1200  vancomycin (VANCOCIN) 1,250 mg in sodium chloride 0.9 % 250 mL IVPB     1,250 mg 166.7 mL/hr over 90 Minutes Intravenous Every 12 hours 05/25/15 0616     05/25/15 0800  piperacillin-tazobactam (ZOSYN) IVPB 3.375 g     3.375 g 12.5 mL/hr over 240 Minutes Intravenous 3 times per day 05/25/15 0616     05/25/15 0230  vancomycin (VANCOCIN) IVPB 1000 mg/200 mL premix     1,000 mg 200 mL/hr over 60 Minutes Intravenous  Once 05/25/15 0215 05/25/15 0406   05/25/15 0230  piperacillin-tazobactam (ZOSYN) IVPB 3.375 g     3.375 g 100 mL/hr over 30 Minutes Intravenous  Once 05/25/15  0215 05/25/15 0306      DVT Prophylaxis: Eliquis  Code Status: Full code   Family Communication Son and spouse at bedside  Procedures: None  CONSULTS:  None  Time spent 35 minutes-Greater than 50% of this time was spent in counseling, explanation of diagnosis, planning of further management, and coordination of care.  MEDICATIONS: Scheduled Meds: . apixaban  2.5 mg Oral BID  . dextromethorphan-guaiFENesin  1 tablet Oral BID  . loratadine  10 mg Oral Daily  . piperacillin-tazobactam  3.375 g Intravenous 3 times per day  . sodium chloride  3 mL Intravenous Q12H  . tamsulosin  0.4 mg Oral Daily  . vancomycin  1,250 mg Intravenous Q12H   Continuous Infusions: . sodium chloride 125 mL/hr at 05/25/15 0648   PRN Meds:.acetaminophen, fluticasone, morphine injection, ondansetron **OR** ondansetron (ZOFRAN) IV, oxyCODONE-acetaminophen    PHYSICAL EXAM: Vital signs in last 24 hours: Filed Vitals:   05/25/15 0530 05/25/15 0537 05/25/15 0600 05/25/15 0730  BP: 137/70  139/79 129/64  Pulse: 95  103 97  Temp:  99.7 F (37.6 C) 98.9 F (37.2 C) 100 F (37.8 C)  TempSrc:  Rectal Oral Oral  Resp: 22  20   Height:   5\' 10"  (1.778 m)   Weight:   92.8 kg (204 lb 9.4 oz)   SpO2: 92%  95% 93%    Weight change:  Filed Weights   05/25/15 0149 05/25/15 0600  Weight: 86.183 kg (190 lb) 92.8 kg (204 lb 9.4 oz)   Body mass index is 29.36 kg/(m^2).   Gen  Exam: Awake but still lethargic-but mostly alert with clear speech. Neck: Supple, No JVD.   Chest: B/L Clear.   CVS: S1 S2 Regular, no murmurs.  Abdomen: soft, BS +, non tender, non distended.  Extremities: no edema, lower extremities warm to touch. Neurologic: Non Focal.   Skin: No Rash.   Wounds: N/A.    Intake/Output from previous day:  Intake/Output Summary (Last 24 hours) at 05/25/15 1321 Last data filed at 05/25/15 1259  Gross per 24 hour  Intake    240 ml  Output      0 ml  Net    240 ml     LAB  RESULTS: CBC  Recent Labs Lab 05/25/15 0236 05/25/15 0240  WBC 11.8*  --   HGB 13.1 14.3  HCT 40.4 42.0  PLT 127*  --   MCV 89.0  --   MCH 28.9  --   MCHC 32.4  --   RDW 14.0  --   LYMPHSABS 0.4*  --   MONOABS 0.8  --   EOSABS 0.0  --   BASOSABS 0.0  --     Chemistries   Recent Labs Lab 05/25/15 0240  NA 138  K 3.3*  CL 104  GLUCOSE 160*  BUN 7  CREATININE 1.00    CBG:  Recent Labs Lab 05/25/15 0814  GLUCAP 155*    GFR Estimated Creatinine Clearance: 68.5 mL/min (by C-G formula based on Cr of 1).  Coagulation profile  Recent Labs Lab 05/25/15 0700  INR 1.44    Cardiac Enzymes No results for input(s): CKMB, TROPONINI, MYOGLOBIN in the last 168 hours.  Invalid input(s): CK  Invalid input(s): POCBNP No results for input(s): DDIMER in the last 72 hours. No results for input(s): HGBA1C in the last 72 hours. No results for input(s): CHOL, HDL, LDLCALC, TRIG, CHOLHDL, LDLDIRECT in the last 72 hours. No results for input(s): TSH, T4TOTAL, T3FREE, THYROIDAB in the last 72 hours.  Invalid input(s): FREET3 No results for input(s): VITAMINB12, FOLATE, FERRITIN, TIBC, IRON, RETICCTPCT in the last 72 hours. No results for input(s): LIPASE, AMYLASE in the last 72 hours.  Urine Studies No results for input(s): UHGB, CRYS in the last 72 hours.  Invalid input(s): UACOL, UAPR, USPG, UPH, UTP, UGL, UKET, UBIL, UNIT, UROB, ULEU, UEPI, UWBC, URBC, UBAC, CAST, UCOM, BILUA  MICROBIOLOGY: No results found for this or any previous visit (from the past 240 hour(s)).  RADIOLOGY STUDIES/RESULTS: Dg Chest 2 View  05/25/2015   CLINICAL DATA:  Acute onset of cough, fever and weakness. Dehydration. Initial encounter.  EXAM: CHEST  2 VIEW  COMPARISON:  Chest radiograph performed 05/30/2014  FINDINGS: The lungs are well-aerated. A small left pleural effusion is noted, with mild left basilar airspace opacity. This may reflect mild asymmetric interstitial edema or possibly  pneumonia. Underlying vascular congestion is seen. There is no evidence of pneumothorax.  The heart is borderline normal in size. No acute osseous abnormalities are seen.  IMPRESSION: Small left pleural effusion, with mild left basilar airspace opacity. This may reflect mild asymmetric interstitial edema or possibly pneumonia. Underlying vascular congestion seen.   Electronically Signed   By: Garald Balding M.D.   On: 05/25/2015 02:59   Ct Chest Wo Contrast  05/25/2015   CLINICAL DATA:  Fever post lithotripsy.  Abnormal chest x-ray.  EXAM: CT CHEST WITHOUT CONTRAST  TECHNIQUE: Multidetector CT imaging of the chest was performed following the standard protocol without IV contrast.  COMPARISON:  Chest 05/25/2015  FINDINGS:  Mild cardiac enlargement. Calcification in the mitral valve annulus. Normal caliber thoracic aorta with mild calcification. Scattered mediastinal lymph nodes are not pathologically enlarged. Esophagus is decompressed. Moderate-sized esophageal hiatal hernia. Calcified lymph nodes in the left hilum.  Small bilateral pleural effusions with basilar atelectasis. Emphysematous changes in the lungs. Scattered fibrosis. 3 mm pulmonary nodule in the right middle lung. If the patient is at high risk for bronchogenic carcinoma, follow-up chest CT at 1 year is recommended. If the patient is at low risk, no follow-up is needed. This recommendation follows the consensus statement: Guidelines for Management of Small Pulmonary Nodules Detected on CT Scans: A Statement from the [place] as published in Radiology 2005; 237:395-400. No pneumothorax.  Included portions of the upper abdominal organs demonstrate surgical absence of the gallbladder. Calcified granulomas in the spleen. Degenerative changes in the spine. No destructive bone lesions.  IMPRESSION: Small bilateral pleural effusions with bilateral basilar atelectasis. Mild emphysematous changes and fibrosis in the lungs. 3 mm nodule in the right  middle lung.   Electronically Signed   By: Lucienne Capers M.D.   On: 05/25/2015 05:22   US Renal  05/25/2015   CLINICAL DATA:  Urinary retention.  Recent lithotripsy  EXAM: RENAL ULTRASOUND  COMPARISON:  CT abdomen and pelvis February 28, 2011  FINDINGS: Right Kidney:  Length: 11.0 cm. Echogenicity and renal cortical thickness are within normal limits. No mass, perinephric fluid, or hydronephrosis visualized. No ureterectasis. There is a small echogenic focus in the lower pole the right kidney which may represent a nonobstructing calculus.  Left Kidney:  Length: 13.9 cm. Echogenicity and renal cortical thickness are within normal limits. No mass, perinephric fluid, or hydronephrosis visualized. Several echogenic foci in the left kidney may represent either small calculi are arcuate arteries. No ureterectasis.  Bladder:  Unable to assess due to decompression with Foley catheter.  IMPRESSION: Suspect small calculus lower pole right kidney. Question small calculi versus arcuate arteries showing echogenicity in the left kidney. No obstructing focus on either side. No perinephric fluid on either side.  Left kidney is larger than right kidney. Significance of this finding is uncertain. This finding potentially could indicate a degree of renal artery stenosis on the right. In this regard, question whether patient is hypertensive.   Electronically Signed   By: Lowella Grip III M.D.   On: 05/25/2015 11:13    Oren Binet, MD  [facility] Pager:[phone]  If 7PM-7AM, please contact night-coverage [email or web address] Password [email or web address] 05/25/2015, 1:21 PM   LOS: 0 days

## 2015-05-25 NOTE — Progress Notes (Signed)
ANTIBIOTIC CONSULT NOTE - INITIAL  Pharmacy Consult for Vancomycin and Zosyn (pharmacy to adjust antibiotics for renal function Indication: sepsis, UTI  Allergies  Allergen Reactions  . Clarithromycin     REACTION: Headache  . Epinephrine     REACTION: Increased Heart Rate  . Nimodipine     REACTION: [place]    Patient Measurements: Height: 5\' 10"  (177.8 cm) Weight: 204 lb 9.4 oz (92.8 kg) IBW/kg (Calculated) : 73 Adjusted Body Weight:   Vital Signs: Temp: 98.9 F (37.2 C) (06/15 0600) Temp Source: Oral (06/15 0600) BP: 139/79 mmHg (06/15 0600) Pulse Rate: 103 (06/15 0600) Intake/Output from previous day:   Intake/Output from this shift:    Labs:  Recent Labs  05/25/15 0236 05/25/15 0240  WBC 11.8*  --   HGB 13.1 14.3  PLT 127*  --   CREATININE  --  1.00   Estimated Creatinine Clearance: 68.5 mL/min (by C-G formula based on Cr of 1). No results for input(s): VANCOTROUGH, VANCOPEAK, VANCORANDOM, GENTTROUGH, GENTPEAK, GENTRANDOM, TOBRATROUGH, TOBRAPEAK, TOBRARND, AMIKACINPEAK, AMIKACINTROU, AMIKACIN in the last 72 hours.   Microbiology: No results found for this or any previous visit (from the past 720 hour(s)).  Medical History: Past Medical History  Diagnosis Date  . Atrial fibrillation Jan 2007    echo 1-07 normal ejection fraction, no significant valvular disease. adenosine cardiolite 1-07  no evidence of ischemia. A 48 hour Holter monitor in 7-07 showed good rate controlw/ chronic atrial fibrillation. Cath 1/11 normal cors. EF 45%. Echo 2-11 60-65%  . Obstructive sleep apnea     noncompliant with CPAP  . Cerebral aneurysm     followed by Dr Estanislado Pandy  . History of chest pain     a. s/p LHC 05/2014 with normal cors  . Obesity   . Fatigue   . Allergy     rhinitis  . Hx of colonic polyps     diverticulosis  . Hyperlipidemia   . Retinal vein occlusion   . Benign prostatic hypertrophy   . IBS (irritable bowel syndrome)   . Incontinence     Per pt  12/14/11  . Kidney stones     Medications:  Anti-infectives    Start     Dose/Rate Route Frequency Ordered Stop   05/25/15 1200  vancomycin (VANCOCIN) 1,250 mg in sodium chloride 0.9 % 250 mL IVPB     1,250 mg 166.7 mL/hr over 90 Minutes Intravenous Every 12 hours 05/25/15 0616     05/25/15 0800  piperacillin-tazobactam (ZOSYN) IVPB 3.375 g     3.375 g 12.5 mL/hr over 240 Minutes Intravenous 3 times per day 05/25/15 0616     05/25/15 0230  vancomycin (VANCOCIN) IVPB 1000 mg/200 mL premix     1,000 mg 200 mL/hr over 60 Minutes Intravenous  Once 05/25/15 0215 05/25/15 0406   05/25/15 0230  piperacillin-tazobactam (ZOSYN) IVPB 3.375 g     3.375 g 100 mL/hr over 30 Minutes Intravenous  Once 05/25/15 0215 05/25/15 0306     Assessment: Patient with sepsis, UTI.  First dose of antibiotics already given.    Goal of Therapy:  Vancomycin trough level 15-20 mcg/ml  Zosyn based on renal function   Plan:  Measure antibiotic drug levels at steady state Follow up culture results Vancomycin 1250mg  iv q12hr  Zosyn 3.375g IV Q8H infused over 4hrs.   Tyler Deis, Shea Stakes [place] 05/25/2015,6:29 AM

## 2015-05-25 NOTE — Progress Notes (Signed)
OT Cancellation Note  Patient Details Name: Maurice Wood MRN: [id number] DOB: 01-05-1933   Cancelled Treatment:    Reason Eval/Treat Not Completed: Patient going for ultrasound.  Will recheck on pt later today or next day. Kari Baars, [place]  Payton Mccallum D 05/25/2015, 9:11 AM

## 2015-05-25 NOTE — Plan of Care (Signed)
Problem: Phase I Progression Outcomes Goal: Other Phase I Outcomes/Goals Outcome: Completed/Met Date Met:  05/25/15 Nausea  Improved

## 2015-05-25 NOTE — Clinical Social Work Note (Signed)
Clinical Social Work Assessment  Patient Details  Name: Maurice Wood MRN: [id number] Date of Birth: 1933/10/05  Date of referral:  05/25/15               Reason for consult:  Facility Placement                Permission sought to share information with:  Chartered certified accountant granted to share information::  Yes, Verbal Permission Granted  Name::        Agency::     Relationship::     Contact Information:     Housing/Transportation Living arrangements for the past 2 months:  Single Family Home Source of Information:  Adult Children, Spouse Patient Interpreter Needed:  None Criminal Activity/Legal Involvement Pertinent to Current Situation/Hospitalization:  No - Comment as needed Significant Relationships:  Adult Children, Spouse Lives with:  Spouse Do you feel safe going back to the place where you live?  No Need for family participation in patient care:  Yes (Comment)  Care giving concerns:  CSW spoke with [place] PT re: recommendation - SNF.    Social Worker assessment / plan:  CSW spoke with patient, wife & sons - Elta Guadeloupe & Aaron Edelman at bedside re: SNF at discharge.   Employment status:  Retired Nurse, adult PT Recommendations:  [place] / Referral to community resources:  [place]  Patient/Family's Response to care:  Patient's family are agreeable with plan for SNF at this time, requesting [facility] or [facility] - awaiting response re: bed availability.   Patient/Family's Understanding of and Emotional Response to Diagnosis, Current Treatment, and Prognosis:  Patient's wife is hoping that the UTI will clear up and he'll be able to return home, but as of now - understands that she would not be able to take care of him in his current weakened/confused condition.   Emotional Assessment Appearance:  Appears stated age Attitude/Demeanor/Rapport:    Affect (typically observed):   Calm Orientation:  Oriented to Self Alcohol / Substance use:    Psych involvement (Current and /or in the community):     Discharge Needs  Concerns to be addressed:    Readmission within the last 30 days:    Current discharge risk:    Barriers to Discharge:      Standley Brooking, LCSW 05/25/2015, 3:45 PM

## 2015-05-25 NOTE — Progress Notes (Addendum)
Pt threw up a small amount of bile colored liquid and undigested food.  Will continue to monitor closely.

## 2015-05-25 NOTE — H&P (Signed)
[facility] History and Physical  Adolphus Hanf Ginzburg JSH:[id number] DOB: 07-31-1933 DOA: 05/25/2015  Referring physician: ED physician PCP: Chancy Hurter, MD  Specialists:   Chief Complaint: fever, cough and urinary retention  HPI: Maurice Wood is a 81 y.o. male with PMH of hyperlipidemia, atrial fibrillation on at decrease, or before meals (intolerance to CPAP), BPH, IBS with chronic mild diarrhea, kidney stone(s/p of right lithotripsy on 05/23/15), who presents with a fever, cough, and urinary retention.  Patient reports that he had right-sided lithotripsy for his kidney stone on 05/23/15. He has been doing well after the procedure. He developed fever and chills since yesterday. He has mild cough without sputum production. No chest pain or shortness breath. He does not have symptoms of UTI. He has chronic diarrhea due to IBS, which has not changed per patient. Supposed to take Flomax after the lithotripsy procedure, but he is not taking this medications currently. He developed urinary retention today.  Currently patient denies fever, chills, running nose, ear pain, headaches, chest pain, SOB, abdominal pain, constipation, dysuria, urgency, frequency, skin rashes or leg swelling. No unilateral weakness, numbness or tingling sensations. No vision change or hearing loss.  In ED, patient was found to have WBC 11.8, tachycardia, temperature 102.4, positive urinalysis for UTI, lactate 0.88, potassium 3.3. Chest x-ray showed possible left basilar infiltration, but CT-chest showed small bilateral pleural effusions with bilateral basilar atelectasis, mild emphysematous changes and fibrosis in the lungs and 3 mmm nodule in the right middle lung. No infiltration.  Where does patient live?   At home   Can patient participate in ADLs?   Some   Review of Systems:   General: has fevers, chills, no changes in body weight, has fatigue HEENT: no blurry vision, hearing changes or sore throat Pulm: no  dyspnea, has coughing, no wheezing CV: no chest pain, palpitations Abd: no nausea, vomiting, abdominal pain, diarrhea, constipation GU: no dysuria, burning on urination, increased urinary frequency, hematuria  Ext: no leg edema Neuro: no unilateral weakness, numbness, or tingling, no vision change or hearing loss Skin: no rash MSK: No muscle spasm, no deformity, no limitation of range of movement in spin Heme: No easy bruising.  Travel history: No recent long distant travel.  Allergy:  Allergies  Allergen Reactions  . Clarithromycin     REACTION: Headache  . Epinephrine     REACTION: Increased Heart Rate  . Nimodipine     REACTION: Flushing    Past Medical History  Diagnosis Date  . Atrial fibrillation Jan 2007    echo 1-07 normal ejection fraction, no significant valvular disease. adenosine cardiolite 1-07  no evidence of ischemia. A 48 hour Holter monitor in 7-07 showed good rate controlw/ chronic atrial fibrillation. Cath 1/11 normal cors. EF 45%. Echo 2-11 60-65%  . Obstructive sleep apnea     noncompliant with CPAP  . Cerebral aneurysm     followed by Dr Estanislado Pandy  . History of chest pain     a. s/p LHC 05/2014 with normal cors  . Obesity   . Fatigue   . Allergy     rhinitis  . Hx of colonic polyps     diverticulosis  . Hyperlipidemia   . Retinal vein occlusion   . Benign prostatic hypertrophy   . IBS (irritable bowel syndrome)   . Incontinence     Per pt 12/14/11  . Kidney stones     Past Surgical History  Procedure Laterality Date  . Cholecystectomy    .  Inguinal and umbilical herniorrhaphy    . Tonsillectomy and adenoidectomy  as a child  . Cardiac catheterization  1999  . Hernia repair    . Left heart catheterization with coronary angiogram N/A 06/01/2014    Procedure: LEFT HEART CATHETERIZATION WITH CORONARY ANGIOGRAM;  Surgeon: Peter M Martinique, MD;  Location: [facility] CATH LAB;  Service: Cardiovascular;  Laterality: N/A;  . Lithotripsy      Social History:   reports that he has quit smoking. His smoking use included Cigarettes. He has never used smokeless tobacco. He reports that he drinks about 0.6 oz of alcohol per week. He reports that he does not use illicit drugs.  Family History:  Family History  Problem Relation Age of Onset  . Cancer Father     lung  . Alcohol abuse Father   . Aneurysm Father     Aortic aneurysm  . Diabetes Son   . Cancer Maternal Grandmother     colon  . Diabetes Maternal Uncle   . Diabetes Paternal Uncle   . Sudden death Mother   . Heart disease Paternal Grandfather      Prior to Admission medications   Medication Sig Start Date End Date Taking? Authorizing Provider  acetaminophen (TYLENOL) 650 MG CR tablet Take 1,300 mg by mouth every 8 (eight) hours as needed for pain.   Yes Historical Provider, MD  cetirizine (ZYRTEC) 10 MG tablet Take 10 mg by mouth daily.   Yes Historical Provider, MD  ELIQUIS 2.5 MG TABS tablet TAKE 1 TABLET TWICE A DAY 06/17/14  Yes Jolaine Artist, MD  fluticasone (FLONASE) 50 MCG/ACT nasal spray Place 1 spray into both nostrils daily as needed for allergies.   Yes Historical Provider, MD  amoxicillin (AMOXIL) 875 MG tablet Take 1 tablet (875 mg total) by mouth 2 (two) times daily. Patient not taking: Reported on 05/25/2015 08/08/14   [name], MD  mometasone (NASONEX) 50 MCG/ACT nasal spray Place 2 sprays into the nose daily. Patient not taking: Reported on 05/25/2015 08/08/14   [name], MD  neomycin-polymyxin-hydrocortisone (CORTISPORIN) otic solution Place 3 drops into the right ear 3 (three) times daily. Patient not taking: Reported on 05/25/2015 08/08/14   [name], MD    Physical Exam: Filed Vitals:   05/25/15 0430 05/25/15 0500 05/25/15 0530 05/25/15 0537  BP: 104/64 105/64 137/70   Pulse: 90 68 95   Temp:    99.7 F (37.6 C)  TempSrc:    Rectal  Resp: 22 24 22    Height:      Weight:      SpO2: 94% 96% 92%    General: Not in acute distress HEENT:        Eyes: PERRL, EOMI, no scleral icterus.       ENT: No discharge from the ears and nose, no pharynx injection, no tonsillar enlargement.        Neck: No JVD, no bruit, no mass felt. Heme: No neck lymph node enlargement. Cardiac: S1/S2, RRR, No murmurs, No gallops or rubs. Pulm: No rales, wheezing, rhonchi or rubs. Abd: Soft, nondistended, nontender, no rebound pain, no organomegaly, BS present. No CVA tenderness. Ext: No pitting leg edema bilaterally. 2+DP/PT pulse bilaterally. Musculoskeletal: No joint deformities, No joint redness or warmth, no limitation of ROM in spin. Skin: No rashes.  Neuro: Alert, oriented X3, cranial nerves II-XII grossly intact, muscle strength 5/5 in all extremities, sensation to light touch intact.  Psych: Patient is not psychotic, no suicidal or hemocidal ideation.  Labs on Admission:  Basic Metabolic Panel:  Recent Labs Lab 05/25/15 0240  NA 138  K 3.3*  CL 104  GLUCOSE 160*  BUN 7  CREATININE 1.00   Liver Function Tests: No results for input(s): AST, ALT, ALKPHOS, BILITOT, PROT, ALBUMIN in the last 168 hours. No results for input(s): LIPASE, AMYLASE in the last 168 hours. No results for input(s): AMMONIA in the last 168 hours. CBC:  Recent Labs Lab 05/25/15 0236 05/25/15 0240  WBC 11.8*  --   NEUTROABS 10.5*  --   HGB 13.1 14.3  HCT 40.4 42.0  MCV 89.0  --   PLT 127*  --    Cardiac Enzymes: No results for input(s): CKTOTAL, CKMB, CKMBINDEX, TROPONINI in the last 168 hours.  BNP (last 3 results) No results for input(s): BNP in the last 8760 hours.  ProBNP (last 3 results)  Recent Labs  05/30/14 1659  PROBNP 571.8*    CBG: No results for input(s): GLUCAP in the last 168 hours.  Radiological Exams on Admission: Dg Chest 2 View  05/25/2015   CLINICAL DATA:  Acute onset of cough, fever and weakness. Dehydration. Initial encounter.  EXAM: CHEST  2 VIEW  COMPARISON:  Chest radiograph performed 05/30/2014  FINDINGS: The lungs are  well-aerated. A small left pleural effusion is noted, with mild left basilar airspace opacity. This may reflect mild asymmetric interstitial edema or possibly pneumonia. Underlying vascular congestion is seen. There is no evidence of pneumothorax.  The heart is borderline normal in size. No acute osseous abnormalities are seen.  IMPRESSION: Small left pleural effusion, with mild left basilar airspace opacity. This may reflect mild asymmetric interstitial edema or possibly pneumonia. Underlying vascular congestion seen.   Electronically Signed   By: Garald Balding M.D.   On: 05/25/2015 02:59   Ct Chest Wo Contrast  05/25/2015   CLINICAL DATA:  Fever post lithotripsy.  Abnormal chest x-ray.  EXAM: CT CHEST WITHOUT CONTRAST  TECHNIQUE: Multidetector CT imaging of the chest was performed following the standard protocol without IV contrast.  COMPARISON:  Chest 05/25/2015  FINDINGS: Mild cardiac enlargement. Calcification in the mitral valve annulus. Normal caliber thoracic aorta with mild calcification. Scattered mediastinal lymph nodes are not pathologically enlarged. Esophagus is decompressed. Moderate-sized esophageal hiatal hernia. Calcified lymph nodes in the left hilum.  Small bilateral pleural effusions with basilar atelectasis. Emphysematous changes in the lungs. Scattered fibrosis. 3 mm pulmonary nodule in the right middle lung. If the patient is at high risk for bronchogenic carcinoma, follow-up chest CT at 1 year is recommended. If the patient is at low risk, no follow-up is needed. This recommendation follows the consensus statement: Guidelines for Management of Small Pulmonary Nodules Detected on CT Scans: A Statement from the [place] as published in Radiology 2005; 237:395-400. No pneumothorax.  Included portions of the upper abdominal organs demonstrate surgical absence of the gallbladder. Calcified granulomas in the spleen. Degenerative changes in the spine. No destructive bone lesions.   IMPRESSION: Small bilateral pleural effusions with bilateral basilar atelectasis. Mild emphysematous changes and fibrosis in the lungs. 3 mm nodule in the right middle lung.   Electronically Signed   By: Lucienne Capers M.D.   On: 05/25/2015 05:22    EKG:  Not done in ED, will get one.   Assessment/Plan Principal Problem:   Sepsis Active Problems:   HLD (hyperlipidemia)   Atrial fibrillation, permanent   BPH (benign prostatic hyperplasia)   Sleep apnea   Long term current use of  anticoagulant   UTI (lower urinary tract infection)   Fever   Cough   Kidney stone   Hypokalemia  Sepsis: Patient is septic on admission with leukocytosis, tachycardia and fever. His lactate is 0.88. Hemodynamically stable. The etiology is likely due to UTI.  -will admit to tele bed -treat UTI as below --will get Procalcitonin and trend lactic acid levels per sepsis protocol. -IVF: 2L of NS bolus in ED, followed by 100 cc/h  UTI: Patient had lithotripsy on Monday in [place] on 05/23/15, presents with positive urinalysis, urinary retention and sepsis. Dr. Dorina Hoyer discussed with his urologist on the phone and got to know that patient had UTI with positive culture for enterococcus, which was resistant to many commonly used antibodies for UTI, but sensitive to vancomycin in the past. Given his sepsis, s/p of lithotripsy procedure and history of positive culture for enterococcus, ED started vancomycin and Zosyn. I think it is reasonable to have antibodies with broad coverage now  -will continue IV vancomycin and Zosyn, will narrow down the antibodies when sensitivity is available -Follow-up blood culture and urine culture -Foley catheter is placed for urinary retention  Cough: Patient has dry cough, but no PNA on CT-chest. Likely due to mild bronchitis or viral infection. - on IV vancomycin and Zosyn - Mucinex for cough   Atrial Fibrillation: CHA2DS2-VASc Score is 2, needs oral anticoagulation. Patient is  on Eliquis at home. Heart rate is controlled. -continue Eliquis   BPH (benign prostatic hyperplasia): -flomax  Hypokalemia: K= 3.3 on admission. - Repleted  DVT ppx: on Eliquis  Code Status: Full code Family Communication:  Yes, patient's wife at bed side Disposition Plan: Admit to inpatient   Date of Service 05/25/2015    Ivor Costa [facility] Pager [phone]  If 7PM-7AM, please contact night-coverage [email or web address] Password [facility] 05/25/2015, 5:48 AM

## 2015-05-25 NOTE — ED Provider Notes (Signed)
CSN: [id number]     Arrival date & time 05/25/15  0140 History   First MD Initiated Contact with Patient 05/25/15 0153     Chief Complaint  Patient presents with  . Fever     (Consider location/radiation/quality/duration/timing/severity/associated sxs/prior Treatment) Patient is a 81 y.o. male presenting with fever. The history is provided by the patient and a relative.  Fever Temp source:  Oral Severity:  Moderate Onset quality:  Gradual Duration:  1 day Timing:  Constant Progression:  Unchanged Chronicity:  New Relieved by:  Nothing Worsened by:  Nothing tried Ineffective treatments:  None tried Associated symptoms: cough   Associated symptoms: no chest pain, no chills, no confusion and no dysuria   Associated symptoms comment:  Inability to urinate Risk factors: no hx of cancer and no immunosuppression     Past Medical History  Diagnosis Date  . Atrial fibrillation Jan 2007    echo 1-07 normal ejection fraction, no significant valvular disease. adenosine cardiolite 1-07  no evidence of ischemia. A 48 hour Holter monitor in 7-07 showed good rate controlw/ chronic atrial fibrillation. Cath 1/11 normal cors. EF 45%. Echo 2-11 60-65%  . Obstructive sleep apnea     noncompliant with CPAP  . Cerebral aneurysm     followed by Dr Estanislado Pandy  . History of chest pain     a. s/p LHC 05/2014 with normal cors  . Obesity   . Fatigue   . Allergy     rhinitis  . Hx of colonic polyps     diverticulosis  . Hyperlipidemia   . Retinal vein occlusion   . Benign prostatic hypertrophy   . IBS (irritable bowel syndrome)   . Incontinence     Per pt 12/14/11  . Kidney stones    Past Surgical History  Procedure Laterality Date  . Cholecystectomy    . Inguinal and umbilical herniorrhaphy    . Tonsillectomy and adenoidectomy  as a child  . Cardiac catheterization  1999  . Hernia repair    . Left heart catheterization with coronary angiogram N/A 06/01/2014    Procedure: LEFT HEART  CATHETERIZATION WITH CORONARY ANGIOGRAM;  Surgeon: Peter M Martinique, MD;  Location: [facility] CATH LAB;  Service: Cardiovascular;  Laterality: N/A;  . Lithotripsy     Family History  Problem Relation Age of Onset  . Cancer Father     lung  . Alcohol abuse Father   . Aneurysm Father     Aortic aneurysm  . Diabetes Son   . Cancer Maternal Grandmother     colon  . Diabetes Maternal Uncle   . Diabetes Paternal Uncle   . Sudden death Mother   . Heart disease Paternal Grandfather    History  Substance Use Topics  . Smoking status: Former Smoker    Types: Cigarettes  . Smokeless tobacco: Never Used     Comment: quit 1973  . Alcohol Use: 0.6 oz/week    1 Cans of beer per week     Comment: Twice a month    Review of Systems  Constitutional: Positive for fever. Negative for chills.  Respiratory: Positive for cough. Negative for shortness of breath.   Cardiovascular: Negative for chest pain and leg swelling.  Genitourinary: Positive for difficulty urinating. Negative for dysuria, flank pain and discharge.  Psychiatric/Behavioral: Negative for confusion.  All other systems reviewed and are negative.     Allergies  Clarithromycin; Epinephrine; and Nimodipine  Home Medications   Prior to Admission medications  Medication Sig Start Date End Date Taking? Authorizing Provider  acetaminophen (TYLENOL) 650 MG CR tablet Take 1,300 mg by mouth every 8 (eight) hours as needed for pain.   Yes Historical Provider, MD  cetirizine (ZYRTEC) 10 MG tablet Take 10 mg by mouth daily.   Yes Historical Provider, MD  ELIQUIS 2.5 MG TABS tablet TAKE 1 TABLET TWICE A DAY 06/17/14  Yes Jolaine Artist, MD  fluticasone (FLONASE) 50 MCG/ACT nasal spray Place 1 spray into both nostrils daily as needed for allergies.   Yes Historical Provider, MD  amoxicillin (AMOXIL) 875 MG tablet Take 1 tablet (875 mg total) by mouth 2 (two) times daily. Patient not taking: Reported on 05/25/2015 08/08/14   [name], MD   mometasone (NASONEX) 50 MCG/ACT nasal spray Place 2 sprays into the nose daily. Patient not taking: Reported on 05/25/2015 08/08/14   [name], MD  neomycin-polymyxin-hydrocortisone (CORTISPORIN) otic solution Place 3 drops into the right ear 3 (three) times daily. Patient not taking: Reported on 05/25/2015 08/08/14   [name], MD   BP 104/64 mmHg  Pulse 90  Temp(Src) 100.5 F (38.1 C) (Rectal)  Resp 22  Ht 5\' 10"  (1.778 m)  Wt 190 lb (86.183 kg)  BMI 27.26 kg/m2  SpO2 94% Physical Exam  Constitutional: He is oriented to person, place, and time. He appears well-developed and well-nourished. No distress.  HENT:  Head: Normocephalic and atraumatic.  Mouth/Throat: Oropharynx is clear and moist.  Eyes: Conjunctivae are normal. Pupils are equal, round, and reactive to light.  Neck: Normal range of motion. Neck supple.  Cardiovascular: Normal rate, regular rhythm and intact distal pulses.   Pulmonary/Chest: Effort normal and breath sounds normal. No respiratory distress. He has no wheezes. He has no rales.  Abdominal: Soft. Bowel sounds are normal. There is no tenderness. There is no rebound and no guarding.  Musculoskeletal: Normal range of motion.  Neurological: He is alert and oriented to person, place, and time. He has normal reflexes.  Skin: Skin is warm and dry.  Psychiatric: He has a normal mood and affect.    ED Course  Procedures (including critical care time) Labs Review Labs Reviewed  CBC WITH DIFFERENTIAL/PLATELET - Abnormal; Notable for the following:    WBC 11.8 (*)    Platelets 127 (*)    Neutrophils Relative % 89 (*)    Neutro Abs 10.5 (*)    Lymphocytes Relative 4 (*)    Lymphs Abs 0.4 (*)    All other components within normal limits  URINALYSIS, ROUTINE W REFLEX MICROSCOPIC (NOT AT [facility]) - Abnormal; Notable for the following:    Color, Urine AMBER (*)    APPearance TURBID (*)    Hgb urine dipstick LARGE (*)    Ketones, ur 15 (*)    Protein, ur  100 (*)    Leukocytes, UA LARGE (*)    All other components within normal limits  URINE MICROSCOPIC-ADD ON - Abnormal; Notable for the following:    Bacteria, UA MANY (*)    All other components within normal limits  I-STAT CHEM 8, ED - Abnormal; Notable for the following:    Potassium 3.3 (*)    Glucose, Bld 160 (*)    All other components within normal limits  URINE CULTURE  CULTURE, BLOOD (ROUTINE X 2)  CULTURE, BLOOD (ROUTINE X 2)  I-STAT CG4 LACTIC ACID, ED  I-STAT CG4 LACTIC ACID, ED    Imaging Review Dg Chest 2 View  05/25/2015   CLINICAL  DATA:  Acute onset of cough, fever and weakness. Dehydration. Initial encounter.  EXAM: CHEST  2 VIEW  COMPARISON:  Chest radiograph performed 05/30/2014  FINDINGS: The lungs are well-aerated. A small left pleural effusion is noted, with mild left basilar airspace opacity. This may reflect mild asymmetric interstitial edema or possibly pneumonia. Underlying vascular congestion is seen. There is no evidence of pneumothorax.  The heart is borderline normal in size. No acute osseous abnormalities are seen.  IMPRESSION: Small left pleural effusion, with mild left basilar airspace opacity. This may reflect mild asymmetric interstitial edema or possibly pneumonia. Underlying vascular congestion seen.   Electronically Signed   By: Garald Balding M.D.   On: 05/25/2015 02:59     EKG Interpretation None      MDM   Final diagnoses:  None    Results for orders placed or performed during the hospital encounter of 05/25/15  CBC with Differential/Platelet  Result Value Ref Range   WBC 11.8 (H) 4.0 - 10.5 K/uL   RBC 4.54 4.22 - 5.81 MIL/uL   Hemoglobin 13.1 13.0 - 17.0 g/dL   HCT 40.4 39.0 - 52.0 %   MCV 89.0 78.0 - 100.0 fL   MCH 28.9 26.0 - 34.0 pg   MCHC 32.4 30.0 - 36.0 g/dL   RDW 14.0 11.5 - 15.5 %   Platelets 127 (L) 150 - 400 K/uL   Neutrophils Relative % 89 (H) 43 - 77 %   Neutro Abs 10.5 (H) 1.7 - 7.7 K/uL   Lymphocytes Relative 4 (L)  12 - 46 %   Lymphs Abs 0.4 (L) 0.7 - 4.0 K/uL   Monocytes Relative 7 3 - 12 %   Monocytes Absolute 0.8 0.1 - 1.0 K/uL   Eosinophils Relative 0 0 - 5 %   Eosinophils Absolute 0.0 0.0 - 0.7 K/uL   Basophils Relative 0 0 - 1 %   Basophils Absolute 0.0 0.0 - 0.1 K/uL  Urinalysis, Routine w reflex microscopic (not at [facility])  Result Value Ref Range   Color, Urine AMBER (A) YELLOW   APPearance TURBID (A) CLEAR   Specific Gravity, Urine 1.019 1.005 - 1.030   pH 6.0 5.0 - 8.0   Glucose, UA NEGATIVE NEGATIVE mg/dL   Hgb urine dipstick LARGE (A) NEGATIVE   Bilirubin Urine NEGATIVE NEGATIVE   Ketones, ur 15 (A) NEGATIVE mg/dL   Protein, ur 100 (A) NEGATIVE mg/dL   Urobilinogen, UA 0.2 0.0 - 1.0 mg/dL   Nitrite NEGATIVE NEGATIVE   Leukocytes, UA LARGE (A) NEGATIVE  Urine microscopic-add on  Result Value Ref Range   WBC, UA TOO NUMEROUS TO COUNT <3 WBC/hpf   RBC / HPF TOO NUMEROUS TO COUNT <3 RBC/hpf   Bacteria, UA MANY (A) RARE   Urine-Other MICROSCOPIC EXAM PERFORMED ON UNCONCENTRATED URINE   I-stat chem 8, ed  Result Value Ref Range   Sodium 138 135 - 145 mmol/L   Potassium 3.3 (L) 3.5 - 5.1 mmol/L   Chloride 104 101 - 111 mmol/L   BUN 7 6 - 20 mg/dL   Creatinine, Ser 1.00 0.61 - 1.24 mg/dL   Glucose, Bld 160 (H) 65 - 99 mg/dL   Calcium, Ion 1.17 1.13 - 1.30 mmol/L   TCO2 19 0 - 100 mmol/L   Hemoglobin 14.3 13.0 - 17.0 g/dL   HCT 42.0 39.0 - 52.0 %  I-Stat CG4 Lactic Acid, ED  Result Value Ref Range   Lactic Acid, Venous 0.88 0.5 - 2.0 mmol/L  Dg Chest 2 View  05/25/2015   CLINICAL DATA:  Acute onset of cough, fever and weakness. Dehydration. Initial encounter.  EXAM: CHEST  2 VIEW  COMPARISON:  Chest radiograph performed 05/30/2014  FINDINGS: The lungs are well-aerated. A small left pleural effusion is noted, with mild left basilar airspace opacity. This may reflect mild asymmetric interstitial edema or possibly pneumonia. Underlying vascular congestion is seen. There is no  evidence of pneumothorax.  The heart is borderline normal in size. No acute osseous abnormalities are seen.  IMPRESSION: Small left pleural effusion, with mild left basilar airspace opacity. This may reflect mild asymmetric interstitial edema or possibly pneumonia. Underlying vascular congestion seen.   Electronically Signed   By: Garald Balding M.D.   On: 05/25/2015 02:59    Medications  tamsulosin (FLOMAX) capsule 0.4 mg (not administered)  sodium chloride 0.9 % bolus 1,000 mL (0 mLs Intravenous Stopped 05/25/15 0344)  acetaminophen (TYLENOL) tablet 1,000 mg (1,000 mg Oral Given 05/25/15 0233)  vancomycin (VANCOCIN) IVPB 1000 mg/200 mL premix (0 mg Intravenous Stopped 05/25/15 0406)  piperacillin-tazobactam (ZOSYN) IVPB 3.375 g (0 g Intravenous Stopped 05/25/15 0306)   Case d/w Dr. Gita Kudo of urology at [facility].  Can be admitted to [facility]. Urine sensitive to vancomycin in the past.  Leave foley in place for them to remove.  Continue flomax  Will admit to medicine at [facility]    Trenity Pha, MD 05/25/15 5945

## 2015-05-25 NOTE — ED Notes (Signed)
Patient presents from home with a fever, starting yesterday. Pt's spouse gave last dose of Tylenol at 22:00. After Tylenol, fever was 101.4 oral at home. Pt had lithotripsy on Monday in [place]. IV was established enroute and 197mL of NS given.

## 2015-05-25 NOTE — Care Management Note (Signed)
Case Management Note  Patient Details  Name: RAIDYN WASSINK MRN: [id number] Date of Birth: Mar 14, 1933  Subjective/Objective:  81 y/o m admitted w/Sepsis.From home.PT cons-await recommendations.                  Action/Plan:Monitor progress for d/c needs.   Expected Discharge Date:                  Expected Discharge Plan:  Home/Self Care  In-House Referral:     Discharge planning Services  CM Consult  Post Acute Care Choice:    Choice offered to:     DME Arranged:    DME Agency:     HH Arranged:    HH Agency:     Status of Service:  In process, will continue to follow  Medicare Important Message Given:    Date Medicare IM Given:    Medicare IM give by:    Date Additional Medicare IM Given:    Additional Medicare Important Message give by:     If discussed at [place] of Stay Meetings, dates discussed:    Additional Comments:  Dessa Phi, RN 05/25/2015, 11:12 AM

## 2015-05-26 DIAGNOSIS — A419 Sepsis, unspecified organism: Principal | ICD-10-CM

## 2015-05-26 DIAGNOSIS — N400 Enlarged prostate without lower urinary tract symptoms: Secondary | ICD-10-CM

## 2015-05-26 DIAGNOSIS — I482 Chronic atrial fibrillation: Secondary | ICD-10-CM

## 2015-05-26 LAB — CBC
HEMATOCRIT: 40.1 % (ref 39.0–52.0)
Hemoglobin: 12.6 g/dL — ABNORMAL LOW (ref 13.0–17.0)
MCH: 29.1 pg (ref 26.0–34.0)
MCHC: 31.4 g/dL (ref 30.0–36.0)
MCV: 92.6 fL (ref 78.0–100.0)
PLATELETS: 111 10*3/uL — AB (ref 150–400)
RBC: 4.33 MIL/uL (ref 4.22–5.81)
RDW: 14.4 % (ref 11.5–15.5)
WBC: 10.9 10*3/uL — AB (ref 4.0–10.5)

## 2015-05-26 LAB — COMPREHENSIVE METABOLIC PANEL
ALBUMIN: 3.2 g/dL — AB (ref 3.5–5.0)
ALK PHOS: 60 U/L (ref 38–126)
ALT: 19 U/L (ref 17–63)
AST: 19 U/L (ref 15–41)
Anion gap: 9 (ref 5–15)
BUN: 16 mg/dL (ref 6–20)
CHLORIDE: 109 mmol/L (ref 101–111)
CO2: 21 mmol/L — ABNORMAL LOW (ref 22–32)
Calcium: 8.4 mg/dL — ABNORMAL LOW (ref 8.9–10.3)
Creatinine, Ser: 1.28 mg/dL — ABNORMAL HIGH (ref 0.61–1.24)
GFR calc Af Amer: 60 mL/min — ABNORMAL LOW (ref >60)
GFR calc non Af Amer: 52 mL/min — ABNORMAL LOW (ref >60)
Glucose, Bld: 131 mg/dL — ABNORMAL HIGH (ref 65–99)
POTASSIUM: 3.8 mmol/L (ref 3.5–5.1)
SODIUM: 139 mmol/L (ref 135–145)
TOTAL PROTEIN: 6.5 g/dL (ref 6.5–8.1)
Total Bilirubin: 1.3 mg/dL — ABNORMAL HIGH (ref 0.3–1.2)

## 2015-05-26 LAB — GLUCOSE, CAPILLARY: GLUCOSE-CAPILLARY: 224 mg/dL — AB (ref 65–99)

## 2015-05-26 MED ORDER — VANCOMYCIN HCL IN DEXTROSE 750-5 MG/150ML-% IV SOLN
750.0000 mg | Freq: Two times a day (BID) | INTRAVENOUS | Status: DC
Start: 2015-05-27 — End: 2015-05-26
  Filled 2015-05-26: qty 150

## 2015-05-26 NOTE — Evaluation (Signed)
Occupational Therapy Evaluation Patient Details Name: Maurice Wood MRN: [id number] DOB: 1933/10/13 Today's Date: 05/26/2015    History of Present Illness 81 y.o. male with PMH of hyperlipidemia, atrial fibrillation, BPH, IBS with chronic mild diarrhea, kidney stone(s/p of right lithotripsy on 05/23/15)and admitted with sepsis secondary to UTI/pyelonephritis   Clinical Impression   Pt admitted with UTI. Pt currently with functional limitations due to the deficits listed below (see OT Problem List).  Pt will benefit from skilled OT to increase their safety and independence with ADL and functional mobility for ADL to facilitate discharge to venue listed below.      Follow Up Recommendations  Home health OT;Supervision/Assistance - 24 hour    Equipment Recommendations  None recommended by OT       Precautions / Restrictions Precautions Precautions: Fall Precaution Comments: monitor sats      Mobility Bed Mobility               General bed mobility comments: pt in chair  Transfers Overall transfer level: Needs assistance Equipment used: Rolling walker (2 wheeled) Transfers: Sit to/from Stand Sit to Stand: Min assist         General transfer comment: verbal cues for hand placement     Balance             Standing balance-Leahy Scale: Good                              ADL Overall ADL's : Needs assistance/impaired Eating/Feeding: Set up;Sitting   Grooming: Sitting;Set up                   Toilet Transfer: Minimal assistance;RW Toilet Transfer Details (indicate cue type and reason): sit to stand  3 times Toileting- Clothing Manipulation and Hygiene: Minimal assistance;Sit to/from stand       Functional mobility during ADLs: Rolling walker;Minimal assistance General ADL Comments: Feel pt can DC home with [facility] OT. wife pleased as she was hoping for home               Pertinent Vitals/Pain Pain Assessment: No/denies pain      Hand Dominance     Extremity/Trunk Assessment Upper Extremity Assessment Upper Extremity Assessment: Generalized weakness           Communication Communication Communication: No difficulties   Cognition Arousal/Alertness: Awake/alert Behavior During Therapy: WFL for tasks assessed/performed Overall Cognitive Status: Within Functional Limits for tasks assessed                                Home Living Family/patient expects to be discharged to:: Private residence Living Arrangements: Spouse/significant other Available Help at Discharge: Family Type of Home: House Home Access: Stairs to enter Technical brewer of Steps: 1 Entrance Stairs-Rails: None Home Layout: One level     Bathroom Shower/Tub: Teacher, early years/pre: Handicapped height     Home Equipment: Radio producer - single point          Prior Functioning/Environment Level of Independence: Independent             OT Diagnosis: Generalized weakness   OT Problem List: Decreased strength   OT Treatment/Interventions: Self-care/ADL training;DME and/or AE instruction;Patient/family education    OT Goals(Current goals can be found in the care plan section) Acute Rehab OT Goals Patient Stated Goal: family would like pt to progress to  d/c home OT Goal Formulation: With patient Time For Goal Achievement: 06/09/15 Potential to Achieve Goals: Good ADL Goals Pt Will Perform Grooming: with supervision;standing Pt Will Transfer to Toilet: with supervision;regular height toilet;ambulating Pt Will Perform Toileting - Clothing Manipulation and hygiene: sit to/from stand;with supervision  OT Frequency: Min 2X/week   Barriers to D/C:            Co-evaluation              End of Session Nurse Communication: Mobility status  Activity Tolerance: Patient tolerated treatment well Patient left: in chair;with call bell/phone within reach;with family/visitor present   Time:  3570-1779 OT Time Calculation (min): 30 min Charges:  OT General Charges $OT Visit: 1 Procedure OT Evaluation $Initial OT Evaluation Tier I: 1 Procedure OT Treatments $Self Care/Home Management : 8-22 mins G-Codes:    Payton Mccallum D 12-Jun-2015, 11:56 AM

## 2015-05-26 NOTE — Progress Notes (Signed)
Pt felt nauseous and had a small amount of clear emesis. Pt refused zofran. Pt stated he felt better afterward. Will continue to monitor closely.  Maurice Wood

## 2015-05-26 NOTE — Clinical Social Work Placement (Signed)
CSW provided patient/wife with SNF bed offers, though wife & patient informed CSW that they now plan to return home with home health at discharge. RNCM, Kathy aware.   No further CSW needs identified - CSW signing off.   Raynaldo Opitz, [facility] Clinical Social Worker cell #: [phone]    CLINICAL SOCIAL WORK PLACEMENT  NOTE  Date:  05/26/2015  Patient Details  Name: Maurice Wood MRN: [id number] Date of Birth: [birth year]  Clinical Social Work is seeking post-discharge placement for this patient at the [place] level of care (*CSW will initial, date and re-position this form in  chart as items are completed):  Yes   Patient/family provided with [place] Work Department's list of facilities offering this level of care within the geographic area requested by the patient (or if unable, by the patient's family).  Yes   Patient/family informed of their freedom to choose among providers that offer the needed level of care, that participate in Medicare, Medicaid or managed care program needed by the patient, have an available bed and are willing to accept the patient.  Yes   Patient/family informed of [facility]'s ownership interest in [facility] and [facility], as well as of the fact that they are under no obligation to receive care at these facilities.  PASRR submitted to EDS on 05/25/15     PASRR number received on 05/25/15     Existing PASRR number confirmed on       FL2 transmitted to all facilities in geographic area requested by pt/family on 05/25/15     FL2 transmitted to all facilities within larger geographic area on       Patient informed that his/her managed care company has contracts with or will negotiate with certain facilities, including the following:        Yes   Patient/family informed of bed offers received.  Patient chooses bed at       Physician recommends and patient chooses bed at       Patient to be transferred to   on  .  Patient to be transferred to facility by       Patient family notified on   of transfer.  Name of family member notified:        PHYSICIAN       Additional Comment:    _______________________________________________ Standley Brooking, LCSW 05/26/2015, 12:55 PM

## 2015-05-26 NOTE — Care Management Note (Signed)
Case Management Note  Patient Details  Name: TREYVION DURKEE MRN: [id number] Date of Birth: 1933/03/11  Subjective/Objective:   Spoke to patient/spouse in rm-pleasantly decline SNF.Want home w/[facility] chosen.TC rep Kristen.TC dme rep Lecretia-rw.family to decide if they already a 3n1-they will let me know tomorrow.                 Action/Plan:d/c plan home w/HHC/DME.   Expected Discharge Date:                  Expected Discharge Plan:  [place]  In-House Referral:     Discharge planning Services  CM Consult  Post Acute Care Choice:    Choice offered to:  Patient  DME Arranged:  Walker rolling DME Agency:  [facility] Arranged:  RN, PT, OT, Nurse's Aide [place] Agency:  [place]  Status of Service:  In process, will continue to follow  Medicare Important Message Given:  Yes Date Medicare IM Given:  05/26/15 Medicare IM give by:  Dessa Phi Date Additional Medicare IM Given:    Additional Medicare Important Message give by:     If discussed at [place] of Stay Meetings, dates discussed:    Additional Comments:  Dessa Phi, RN 05/26/2015, 4:16 PM

## 2015-05-26 NOTE — Progress Notes (Signed)
PATIENT DETAILS Name: Maurice Wood Age: 81 y.o. Sex: male Date of Birth: 08-Jul-1933 Admit Date: 05/25/2015 Admitting Physician Ivor Costa, MD TWK:[id number],OTRRN [name], MD  Subjective: Much more improved-awake and alert.   Assessment/Plan: Principal Problem: Sepsis: Secondary to UTI/pyelonephritis. Recently treated for enterococcus UTI, and also underwent right ureteral stent placement (5/26) along with lithotripsy for a large right renal calculi (6/13).Continue with both vancomycin and Zosyn . Blood culture neg so far, urine cx pending. Fever curve improving, and sepsis pathophysiology seems to have improved  Active Problems: Probable pyelonephritis: See above.  Acute encephalopathy: Improved, much more awake and alert. Likely secondary to above. Continue with IV antibiotics and follow clinical course  Acute urinary retention: Continue Flomax, continue Foley catheter. May need a voiding trial prior to discharge.  HAF:BXUXYB pre-renal azotemia and urinary retention-follow creatinine. Renal Ultrasound neg for hydronephroses  Hypokalemia: Repleted  Thrombocytopenia:secondary to sepsis-follow  Atrial fibrillation:CHA2DS2-VASc Score is 2- continue Eliquis area and not on any rate controlling agents. Monitor in telemetry  Disposition: Remain inpatient  Antimicrobial agents  See below  Anti-infectives    Start     Dose/Rate Route Frequency Ordered Stop   05/25/15 1200  vancomycin (VANCOCIN) 1,250 mg in sodium chloride 0.9 % 250 mL IVPB     1,250 mg 166.7 mL/hr over 90 Minutes Intravenous Every 12 hours 05/25/15 0616     05/25/15 0800  piperacillin-tazobactam (ZOSYN) IVPB 3.375 g     3.375 g 12.5 mL/hr over 240 Minutes Intravenous 3 times per day 05/25/15 0616     05/25/15 0230  vancomycin (VANCOCIN) IVPB 1000 mg/200 mL premix     1,000 mg 200 mL/hr over 60 Minutes Intravenous  Once 05/25/15 0215 05/25/15 0406   05/25/15 0230  piperacillin-tazobactam (ZOSYN)  IVPB 3.375 g     3.375 g 100 mL/hr over 30 Minutes Intravenous  Once 05/25/15 0215 05/25/15 0306      DVT Prophylaxis: Eliquis  Code Status: Full code   Family Communication Son and spouse at bedside  Procedures: None  CONSULTS:  None  Time spent 25 minutes-Greater than 50% of this time was spent in counseling, explanation of diagnosis, planning of further management, and coordination of care.  MEDICATIONS: Scheduled Meds: . apixaban  2.5 mg Oral BID  . dextromethorphan-guaiFENesin  1 tablet Oral BID  . loratadine  10 mg Oral Daily  . piperacillin-tazobactam  3.375 g Intravenous 3 times per day  . sodium chloride  3 mL Intravenous Q12H  . tamsulosin  0.4 mg Oral Daily  . vancomycin  1,250 mg Intravenous Q12H   Continuous Infusions: . sodium chloride 100 mL/hr at 05/25/15 2230   PRN Meds:.acetaminophen, fluticasone, morphine injection, ondansetron **OR** ondansetron (ZOFRAN) IV, oxyCODONE-acetaminophen    PHYSICAL EXAM: Vital signs in last 24 hours: Filed Vitals:   05/25/15 1323 05/25/15 2139 05/26/15 0456 05/26/15 0657  BP: 114/66 112/90 143/90   Pulse: 88 110 109   Temp: 98.2 F (36.8 C) 100.1 F (37.8 C) 100 F (37.8 C) 99.1 F (37.3 C)  TempSrc: Axillary Oral Oral Oral  Resp: 24 24 20    Height:      Weight:      SpO2: 97% 96% 97%     Weight change:  Filed Weights   05/25/15 0149 05/25/15 0600  Weight: 86.183 kg (190 lb) 92.8 kg (204 lb 9.4 oz)   Body mass index is 29.36 kg/(m^2).   Gen Exam:  Awake and alert Neck: Supple, No JVD.   Chest: B/L Clear.   CVS: S1 S2 Regular, no murmurs.  Abdomen: soft, BS +, non tender, non distended.  Extremities: no edema, lower extremities warm to touch. Neurologic: Non Focal.   Skin: No Rash.   Wounds: N/A.    Intake/Output from previous day:  Intake/Output Summary (Last 24 hours) at 05/26/15 1212 Last data filed at 05/26/15 1025  Gross per 24 hour  Intake 3721.67 ml  Output   1551 ml  Net 2170.67  ml     LAB RESULTS: CBC  Recent Labs Lab 05/25/15 0236 05/25/15 0240 05/26/15 0435  WBC 11.8*  --  10.9*  HGB 13.1 14.3 12.6*  HCT 40.4 42.0 40.1  PLT 127*  --  111*  MCV 89.0  --  92.6  MCH 28.9  --  29.1  MCHC 32.4  --  31.4  RDW 14.0  --  14.4  LYMPHSABS 0.4*  --   --   MONOABS 0.8  --   --   EOSABS 0.0  --   --   BASOSABS 0.0  --   --     Chemistries   Recent Labs Lab 05/25/15 0240 05/26/15 0435  NA 138 139  K 3.3* 3.8  CL 104 109  CO2  --  21*  GLUCOSE 160* 131*  BUN 7 16  CREATININE 1.00 1.28*  CALCIUM  --  8.4*    CBG:  Recent Labs Lab 05/25/15 0814 05/26/15 0735  GLUCAP 155* 224*    GFR Estimated Creatinine Clearance: 53.5 mL/min (by C-G formula based on Cr of 1.28).  Coagulation profile  Recent Labs Lab 05/25/15 0700  INR 1.44    Cardiac Enzymes No results for input(s): CKMB, TROPONINI, MYOGLOBIN in the last 168 hours.  Invalid input(s): CK  Invalid input(s): POCBNP No results for input(s): DDIMER in the last 72 hours. No results for input(s): HGBA1C in the last 72 hours. No results for input(s): CHOL, HDL, LDLCALC, TRIG, CHOLHDL, LDLDIRECT in the last 72 hours. No results for input(s): TSH, T4TOTAL, T3FREE, THYROIDAB in the last 72 hours.  Invalid input(s): FREET3 No results for input(s): VITAMINB12, FOLATE, FERRITIN, TIBC, IRON, RETICCTPCT in the last 72 hours. No results for input(s): LIPASE, AMYLASE in the last 72 hours.  Urine Studies No results for input(s): UHGB, CRYS in the last 72 hours.  Invalid input(s): UACOL, UAPR, USPG, UPH, UTP, UGL, UKET, UBIL, UNIT, UROB, ULEU, UEPI, UWBC, URBC, UBAC, CAST, UCOM, BILUA  MICROBIOLOGY: Recent Results (from the past 240 hour(s))  Urine culture     Status: None (Preliminary result)   Collection Time: 05/25/15  2:19 AM  Result Value Ref Range Status   Specimen Description URINE, CATHETERIZED  Final   Special Requests Normal  Final   Culture   Final    ENTEROCOCCUS  SPECIES Performed at [facility]    Report Status PENDING  Incomplete  Blood culture (routine x 2)     Status: None (Preliminary result)   Collection Time: 05/25/15  2:37 AM  Result Value Ref Range Status   Specimen Description BLOOD RIGHT FOREARM  Final   Special Requests BOTTLES DRAWN AEROBIC AND ANAEROBIC  Final   Culture   Final           BLOOD CULTURE RECEIVED NO GROWTH TO DATE CULTURE WILL BE HELD FOR 5 DAYS BEFORE ISSUING A FINAL NEGATIVE REPORT Performed at [facility]    Report Status PENDING  Incomplete  Blood culture (routine x 2)     Status: None (Preliminary result)   Collection Time: 05/25/15  2:37 AM  Result Value Ref Range Status   Specimen Description RIGHT ANTECUBITAL  Final   Special Requests BOTTLES DRAWN AEROBIC AND ANAEROBIC  Final   Culture   Final           BLOOD CULTURE RECEIVED NO GROWTH TO DATE CULTURE WILL BE HELD FOR 5 DAYS BEFORE ISSUING A FINAL NEGATIVE REPORT Performed at [facility]    Report Status PENDING  Incomplete    RADIOLOGY STUDIES/RESULTS: Dg Chest 2 View  05/25/2015   CLINICAL DATA:  Acute onset of cough, fever and weakness. Dehydration. Initial encounter.  EXAM: CHEST  2 VIEW  COMPARISON:  Chest radiograph performed 05/30/2014  FINDINGS: The lungs are well-aerated. A small left pleural effusion is noted, with mild left basilar airspace opacity. This may reflect mild asymmetric interstitial edema or possibly pneumonia. Underlying vascular congestion is seen. There is no evidence of pneumothorax.  The heart is borderline normal in size. No acute osseous abnormalities are seen.  IMPRESSION: Small left pleural effusion, with mild left basilar airspace opacity. This may reflect mild asymmetric interstitial edema or possibly pneumonia. Underlying vascular congestion seen.   Electronically Signed   By: Garald Balding M.D.   On: 05/25/2015 02:59   Ct Chest Wo Contrast  05/25/2015   CLINICAL DATA:  Fever post lithotripsy.   Abnormal chest x-ray.  EXAM: CT CHEST WITHOUT CONTRAST  TECHNIQUE: Multidetector CT imaging of the chest was performed following the standard protocol without IV contrast.  COMPARISON:  Chest 05/25/2015  FINDINGS: Mild cardiac enlargement. Calcification in the mitral valve annulus. Normal caliber thoracic aorta with mild calcification. Scattered mediastinal lymph nodes are not pathologically enlarged. Esophagus is decompressed. Moderate-sized esophageal hiatal hernia. Calcified lymph nodes in the left hilum.  Small bilateral pleural effusions with basilar atelectasis. Emphysematous changes in the lungs. Scattered fibrosis. 3 mm pulmonary nodule in the right middle lung. If the patient is at high risk for bronchogenic carcinoma, follow-up chest CT at 1 year is recommended. If the patient is at low risk, no follow-up is needed. This recommendation follows the consensus statement: Guidelines for Management of Small Pulmonary Nodules Detected on CT Scans: A Statement from the [employer] as published in Radiology 2005; 237:395-400. No pneumothorax.  Included portions of the upper abdominal organs demonstrate surgical absence of the gallbladder. Calcified granulomas in the spleen. Degenerative changes in the spine. No destructive bone lesions.  IMPRESSION: Small bilateral pleural effusions with bilateral basilar atelectasis. Mild emphysematous changes and fibrosis in the lungs. 3 mm nodule in the right middle lung.   Electronically Signed   By: Lucienne Capers M.D.   On: 05/25/2015 05:22   US Renal  05/25/2015   CLINICAL DATA:  Urinary retention.  Recent lithotripsy  EXAM: RENAL ULTRASOUND  COMPARISON:  CT abdomen and pelvis February 28, 2011  FINDINGS: Right Kidney:  Length: 11.0 cm. Echogenicity and renal cortical thickness are within normal limits. No mass, perinephric fluid, or hydronephrosis visualized. No ureterectasis. There is a small echogenic focus in the lower pole the right kidney which may represent  a nonobstructing calculus.  Left Kidney:  Length: 13.9 cm. Echogenicity and renal cortical thickness are within normal limits. No mass, perinephric fluid, or hydronephrosis visualized. Several echogenic foci in the left kidney may represent either small calculi are arcuate arteries. No ureterectasis.  Bladder:  Unable to assess due to decompression with Foley  catheter.  IMPRESSION: Suspect small calculus lower pole right kidney. Question small calculi versus arcuate arteries showing echogenicity in the left kidney. No obstructing focus on either side. No perinephric fluid on either side.  Left kidney is larger than right kidney. Significance of this finding is uncertain. This finding potentially could indicate a degree of renal artery stenosis on the right. In this regard, question whether patient is hypertensive.   Electronically Signed   By: Lowella Grip III M.D.   On: 05/25/2015 11:13    Oren Binet, MD  [facility] Pager:[phone]  If 7PM-7AM, please contact night-coverage [email or web address] Password [email or web address] 05/26/2015, 12:12 PM   LOS: 1 day

## 2015-05-26 NOTE — Clinical Documentation Improvement (Signed)
Please provide additional clinical indicators supportive of your documented diagnosis of AKI.  Supporting Information: Signs & Symptoms: Component     Latest Ref Rng 05/25/2015 05/26/2015           Sodium     135 - 145 mmol/L 138 139  Potassium     3.5 - 5.1 mmol/L 3.3 (L) 3.8  Chloride     101 - 111 mmol/L 104 109  CO2     22 - 32 mmol/L  21 (L)  Glucose     65 - 99 mg/dL 160 (H) 131 (H)  BUN     6 - 20 mg/dL 7 16  Creatinine     0.61 - 1.24 mg/dL 1.00 1.28 (H)    Thank You, Alessandra Grout, RN, BSN, CCDS,Clinical Documentation Specialist:  [phone]  [phone]=[facility] [place]- Health Information Management

## 2015-05-26 NOTE — Progress Notes (Signed)
PT Cancellation Note  Patient Details Name: Maurice Wood MRN: [id number] DOB: 1933-02-07   Cancelled Treatment:    Reason Eval/Treat Not Completed: Fatigue/lethargy limiting ability to participate RN reports pt in chair all morning and worked with OT however now sleeping in bed, very groggy.  Will check back as schedule permits.   [name],[name] 05/26/2015, 2:24 PM

## 2015-05-26 NOTE — Progress Notes (Signed)
ANTIBIOTIC CONSULT NOTE - FOLLOW UP  Pharmacy Consult for Vancomycin, renally adjust Zosyn Indication: urosepsis  Allergies  Allergen Reactions  . Clarithromycin     REACTION: Headache  . Epinephrine     REACTION: Increased Heart Rate  . Nimodipine     REACTION: Flushing    Patient Measurements: Height: 5\' 10"  (177.8 cm) Weight: 204 lb 9.4 oz (92.8 kg) IBW/kg (Calculated) : 73  Vital Signs: Temp: 97.3 F (36.3 C) (06/16 1301) Temp Source: Oral (06/16 1301) BP: 143/90 mmHg (06/16 0456) Pulse Rate: 109 (06/16 0456) Intake/Output from previous day: 06/15 0701 - 06/16 0700 In: 3481.7 [P.O.:480; I.V.:2301.7; IV Piggyback:700] Out: 1251 [Urine:1250; Emesis/NG output:1] Intake/Output from this shift: Total I/O In: 480 [P.O.:480] Out: 300 [Urine:300]  Labs:  Recent Labs  05/25/15 0236 05/25/15 0240 05/26/15 0435  WBC 11.8*  --  10.9*  HGB 13.1 14.3 12.6*  PLT 127*  --  111*  CREATININE  --  1.00 1.28*   Estimated Creatinine Clearance: 53.5 mL/min (by C-G formula based on Cr of 1.28). No results for input(s): VANCOTROUGH, VANCOPEAK, VANCORANDOM, GENTTROUGH, GENTPEAK, GENTRANDOM, TOBRATROUGH, TOBRAPEAK, TOBRARND, AMIKACINPEAK, AMIKACINTROU, AMIKACIN in the last 72 hours.   Microbiology: Recent Results (from the past 720 hour(s))  Urine culture     Status: None (Preliminary result)   Collection Time: 05/25/15  2:19 AM  Result Value Ref Range Status   Specimen Description URINE, CATHETERIZED  Final   Special Requests Normal  Final   Culture   Final    ENTEROCOCCUS SPECIES Performed at [facility]    Report Status PENDING  Incomplete  Blood culture (routine x 2)     Status: None (Preliminary result)   Collection Time: 05/25/15  2:37 AM  Result Value Ref Range Status   Specimen Description BLOOD RIGHT FOREARM  Final   Special Requests BOTTLES DRAWN AEROBIC AND ANAEROBIC  Final   Culture   Final           BLOOD CULTURE RECEIVED NO GROWTH TO DATE CULTURE  WILL BE HELD FOR 5 DAYS BEFORE ISSUING A FINAL NEGATIVE REPORT Performed at [facility]    Report Status PENDING  Incomplete  Blood culture (routine x 2)     Status: None (Preliminary result)   Collection Time: 05/25/15  2:37 AM  Result Value Ref Range Status   Specimen Description RIGHT ANTECUBITAL  Final   Special Requests BOTTLES DRAWN AEROBIC AND ANAEROBIC  Final   Culture   Final           BLOOD CULTURE RECEIVED NO GROWTH TO DATE CULTURE WILL BE HELD FOR 5 DAYS BEFORE ISSUING A FINAL NEGATIVE REPORT Performed at [facility]    Report Status PENDING  Incomplete    Anti-infectives    Start     Dose/Rate Route Frequency Ordered Stop   05/25/15 1200  vancomycin (VANCOCIN) 1,250 mg in sodium chloride 0.9 % 250 mL IVPB     1,250 mg 166.7 mL/hr over 90 Minutes Intravenous Every 12 hours 05/25/15 0616     05/25/15 0800  piperacillin-tazobactam (ZOSYN) IVPB 3.375 g     3.375 g 12.5 mL/hr over 240 Minutes Intravenous 3 times per day 05/25/15 0616     05/25/15 0230  vancomycin (VANCOCIN) IVPB 1000 mg/200 mL premix     1,000 mg 200 mL/hr over 60 Minutes Intravenous  Once 05/25/15 0215 05/25/15 0406   05/25/15 0230  piperacillin-tazobactam (ZOSYN) IVPB 3.375 g     3.375 g 100  mL/hr over 30 Minutes Intravenous  Once 05/25/15 0215 05/25/15 0306      Assessment: 23 yoM with sepsis secondary to UTI/pyelonephritis. Recently treated for enterococcus UTI, and also underwent right ureteral stent placement (5/26) along with lithotripsy for a large right renal calculi (6/13).  Pharmacy to dose vancomycin and adjust Zosyn.  05/25/2015 >> vanc >> 05/25/2015 >>  zosyn>>  Temp: 102 on admission, 100.1 yesterday, now afebrile WBC: sl elevated, stable Renal: SCr bump yesterday; CrCl 54 CG  5/23 urine (from [facility]). EMonia Pouch - S to AMP, NTF, PCN, vanco 6/15 blood: NGTD 6/15 urine: Enterococcus; speciation & sens pending  Goal of Therapy:  Vancomycin trough level 10-15 mcg/ml   Eradication of infection Appropriate antibiotic dosing for indication and renal function  Plan:  Day 2 antibiotics  With bump in SCr, qualifies for 750 mg IV q12, but will check VT tonight as patient has already received 4 doses of vancomycin.  Given urine culture in progress, will likely be able to de-escalate vanc soon.  Can target lower vanc trough as sepsis appears to have resolved  Continue Zosyn 3.375 g IV q8 hr by extended infusion as ordered by MD  Follow clinical course, renal function, culture results as available  Follow for de-escalation of antibiotics and LOT   Reuel Boom, PharmD Pager: [phone] 05/26/2015, 2:12 PM

## 2015-05-27 DIAGNOSIS — N390 Urinary tract infection, site not specified: Secondary | ICD-10-CM

## 2015-05-27 LAB — VANCOMYCIN, TROUGH: VANCOMYCIN TR: 17 ug/mL (ref 10.0–20.0)

## 2015-05-27 LAB — URINE CULTURE: Special Requests: NORMAL

## 2015-05-27 LAB — CBC
HCT: 36.1 % — ABNORMAL LOW (ref 39.0–52.0)
Hemoglobin: 11.6 g/dL — ABNORMAL LOW (ref 13.0–17.0)
MCH: 29.6 pg (ref 26.0–34.0)
MCHC: 32.1 g/dL (ref 30.0–36.0)
MCV: 92.1 fL (ref 78.0–100.0)
PLATELETS: 113 10*3/uL — AB (ref 150–400)
RBC: 3.92 MIL/uL — AB (ref 4.22–5.81)
RDW: 14.6 % (ref 11.5–15.5)
WBC: 8.4 10*3/uL (ref 4.0–10.5)

## 2015-05-27 LAB — BASIC METABOLIC PANEL
Anion gap: 8 (ref 5–15)
BUN: 14 mg/dL (ref 6–20)
CHLORIDE: 110 mmol/L (ref 101–111)
CO2: 20 mmol/L — ABNORMAL LOW (ref 22–32)
CREATININE: 0.95 mg/dL (ref 0.61–1.24)
Calcium: 8.2 mg/dL — ABNORMAL LOW (ref 8.9–10.3)
GFR calc Af Amer: >60 mL/min (ref >60)
GLUCOSE: 144 mg/dL — AB (ref 65–99)
POTASSIUM: 3.3 mmol/L — AB (ref 3.5–5.1)
Sodium: 138 mmol/L (ref 135–145)

## 2015-05-27 LAB — GLUCOSE, CAPILLARY: GLUCOSE-CAPILLARY: 135 mg/dL — AB (ref 65–99)

## 2015-05-27 MED ORDER — VANCOMYCIN HCL 10 G IV SOLR
1500.0000 mg | INTRAVENOUS | Status: DC
  Administered 2015-05-27: 1500 mg via INTRAVENOUS
  Filled 2015-05-27: qty 1500

## 2015-05-27 MED ORDER — POTASSIUM CHLORIDE CRYS ER 20 MEQ PO TBCR
40.0000 meq | EXTENDED_RELEASE_TABLET | Freq: Once | ORAL | Status: AC
  Administered 2015-05-27: 40 meq via ORAL
  Filled 2015-05-27: qty 2

## 2015-05-27 MED ORDER — SODIUM CHLORIDE 0.9 % IV SOLN
1.0000 g | Freq: Four times a day (QID) | INTRAVENOUS | Status: DC
  Administered 2015-05-27 – 2015-05-28 (×4): 1 g via INTRAVENOUS
  Filled 2015-05-27 (×7): qty 1000

## 2015-05-27 NOTE — Progress Notes (Addendum)
PATIENT DETAILS Name: Maurice Wood Age: 81 y.o. Sex: male Date of Birth: August 09, 1933 Admit Date: 05/25/2015 Admitting Physician Ivor Costa, MD MVH:QIONGE,XBMWU HENRY, MD  Subjective: Awake and alert-much improved.   Assessment/Plan: Principal Problem: Sepsis: Secondary to UTI/pyelonephritis. Recently treated for enterococcus UTI (completed Amoxcillin early June), and also underwent right ureteral stent placement (5/26) along with lithotripsy for a large right renal calculi (6/13).Sepsis pathophysiology has resolved, initially on vancomycin and Zosyn-since urine cultures positive for enterococcus-will switch over to Ampicillin.  Blood culture neg so far  Active Problems: Probable pyelonephritis: See above.  Acute encephalopathy:Resolved. Likely secondary to above.  Acute urinary retention: Continue Flomax,  Foley catheter placed on admission. Will attempt voiding trial today  XLK:GMWNUU pre-renal azotemia and urinary retention-Resolved with supportive care.Renal Ultrasound neg for hydronephroses  Hypokalemia: Replete and recheck periodically  Thrombocytopenia:secondary to sepsis-follow  Atrial fibrillation:CHA2DS2-VASc Score is 2- continue Eliquis,not on any rate controlling agents. Monitor in telemetry  Disposition: Remain inpatient-home in 1-2 days  Antimicrobial agents  See below  Anti-infectives    Start     Dose/Rate Route Frequency Ordered Stop   05/27/15 0800  ampicillin (OMNIPEN) 1 g in sodium chloride 0.9 % 50 mL IVPB     1 g 150 mL/hr over 20 Minutes Intravenous 4 times per day 05/27/15 0741     05/27/15 0300  vancomycin (VANCOCIN) 1,500 mg in sodium chloride 0.9 % 500 mL IVPB  Status:  Discontinued     1,500 mg 250 mL/hr over 120 Minutes Intravenous Every 24 hours 05/27/15 0111 05/27/15 0714   05/27/15 0000  vancomycin (VANCOCIN) IVPB 750 mg/150 ml premix  Status:  Discontinued     750 mg 150 mL/hr over 60 Minutes Intravenous Every 12 hours  05/26/15 1417 05/26/15 2217   05/25/15 1200  vancomycin (VANCOCIN) 1,250 mg in sodium chloride 0.9 % 250 mL IVPB  Status:  Discontinued     1,250 mg 166.7 mL/hr over 90 Minutes Intravenous Every 12 hours 05/25/15 0616 05/26/15 1417   05/25/15 0800  piperacillin-tazobactam (ZOSYN) IVPB 3.375 g  Status:  Discontinued     3.375 g 12.5 mL/hr over 240 Minutes Intravenous 3 times per day 05/25/15 0616 05/27/15 0714   05/25/15 0230  vancomycin (VANCOCIN) IVPB 1000 mg/200 mL premix     1,000 mg 200 mL/hr over 60 Minutes Intravenous  Once 05/25/15 0215 05/25/15 0406   05/25/15 0230  piperacillin-tazobactam (ZOSYN) IVPB 3.375 g     3.375 g 100 mL/hr over 30 Minutes Intravenous  Once 05/25/15 0215 05/25/15 0306      DVT Prophylaxis: Eliquis  Code Status: Full code   Family Communication Spouse at bedside  Procedures: None  CONSULTS:  None  Time spent 25 minutes-Greater than 50% of this time was spent in counseling, explanation of diagnosis, planning of further management, and coordination of care.  MEDICATIONS: Scheduled Meds: . ampicillin (OMNIPEN) IV  1 g Intravenous 4 times per day  . apixaban  2.5 mg Oral BID  . dextromethorphan-guaiFENesin  1 tablet Oral BID  . loratadine  10 mg Oral Daily  . sodium chloride  3 mL Intravenous Q12H  . tamsulosin  0.4 mg Oral Daily   Continuous Infusions:   PRN Meds:.acetaminophen, fluticasone, morphine injection, ondansetron **OR** ondansetron (ZOFRAN) IV, oxyCODONE-acetaminophen    PHYSICAL EXAM: Vital signs in last 24 hours: Filed Vitals:   05/26/15 1435 05/26/15 1552 05/26/15 2116 05/27/15 0430  BP:  133/85  136/84 137/88  Pulse: 110  112 102  Temp: 98.2 F (36.8 C) 99.7 F (37.6 C) 98.7 F (37.1 C) 99.1 F (37.3 C)  TempSrc: Oral Oral Oral Oral  Resp: 22  20 20   Height:      Weight:      SpO2: 96%  96% 95%    Weight change:  Filed Weights   05/25/15 0149 05/25/15 0600  Weight: 86.183 kg (190 lb) 92.8 kg (204 lb 9.4  oz)   Body mass index is 29.36 kg/(m^2).   Gen Exam: Awake and alert Neck: Supple, No JVD.   Chest: B/L Clear.   CVS: S1 S2 Regular, no murmurs.  Abdomen: soft, BS +, non tender, non distended.  Extremities: no edema, lower extremities warm to touch. Neurologic: Non Focal.   Skin: No Rash.   Wounds: N/A.    Intake/Output from previous day:  Intake/Output Summary (Last 24 hours) at 05/27/15 0933 Last data filed at 05/27/15 0700  Gross per 24 hour  Intake   3495 ml  Output   1395 ml  Net   2100 ml     LAB RESULTS: CBC  Recent Labs Lab 05/25/15 0236 05/25/15 0240 05/26/15 0435 05/27/15 0345  WBC 11.8*  --  10.9* 8.4  HGB 13.1 14.3 12.6* 11.6*  HCT 40.4 42.0 40.1 36.1*  PLT 127*  --  111* 113*  MCV 89.0  --  92.6 92.1  MCH 28.9  --  29.1 29.6  MCHC 32.4  --  31.4 32.1  RDW 14.0  --  14.4 14.6  LYMPHSABS 0.4*  --   --   --   MONOABS 0.8  --   --   --   EOSABS 0.0  --   --   --   BASOSABS 0.0  --   --   --     Chemistries   Recent Labs Lab 05/25/15 0240 05/26/15 0435 05/27/15 0345  NA 138 139 138  K 3.3* 3.8 3.3*  CL 104 109 110  CO2  --  21* 20*  GLUCOSE 160* 131* 144*  BUN 7 16 14   CREATININE 1.00 1.28* 0.95  CALCIUM  --  8.4* 8.2*    CBG:  Recent Labs Lab 05/25/15 0814 05/26/15 0735 05/27/15 0757  GLUCAP 155* 224* 135*    GFR Estimated Creatinine Clearance: 72.1 mL/min (by C-G formula based on Cr of 0.95).  Coagulation profile  Recent Labs Lab 05/25/15 0700  INR 1.44    Cardiac Enzymes No results for input(s): CKMB, TROPONINI, MYOGLOBIN in the last 168 hours.  Invalid input(s): CK  Invalid input(s): POCBNP No results for input(s): DDIMER in the last 72 hours. No results for input(s): HGBA1C in the last 72 hours. No results for input(s): CHOL, HDL, LDLCALC, TRIG, CHOLHDL, LDLDIRECT in the last 72 hours. No results for input(s): TSH, T4TOTAL, T3FREE, THYROIDAB in the last 72 hours.  Invalid input(s): FREET3 No results for  input(s): VITAMINB12, FOLATE, FERRITIN, TIBC, IRON, RETICCTPCT in the last 72 hours. No results for input(s): LIPASE, AMYLASE in the last 72 hours.  Urine Studies No results for input(s): UHGB, CRYS in the last 72 hours.  Invalid input(s): UACOL, UAPR, USPG, UPH, UTP, UGL, UKET, UBIL, UNIT, UROB, ULEU, UEPI, UWBC, URBC, UBAC, CAST, UCOM, BILUA  MICROBIOLOGY: Recent Results (from the past 240 hour(s))  Urine culture     Status: None   Collection Time: 05/25/15  2:19 AM  Result Value Ref Range Status   Specimen  Description URINE, CATHETERIZED  Final   Special Requests Normal  Final   Culture   Final    ENTEROCOCCUS SPECIES Performed at [facility]    Report Status 05/27/2015 FINAL  Final   Organism ID, Bacteria ENTEROCOCCUS SPECIES  Final      Susceptibility   Enterococcus species - MIC*    AMPICILLIN <=2 SENSITIVE Sensitive     LEVOFLOXACIN 1 SENSITIVE Sensitive     NITROFURANTOIN <=16 SENSITIVE Sensitive     VANCOMYCIN 1 SENSITIVE Sensitive     TETRACYCLINE >=16 RESISTANT Resistant     * ENTEROCOCCUS SPECIES  Blood culture (routine x 2)     Status: None (Preliminary result)   Collection Time: 05/25/15  2:37 AM  Result Value Ref Range Status   Specimen Description BLOOD RIGHT FOREARM  Final   Special Requests BOTTLES DRAWN AEROBIC AND ANAEROBIC  Final   Culture   Final           BLOOD CULTURE RECEIVED NO GROWTH TO DATE CULTURE WILL BE HELD FOR 5 DAYS BEFORE ISSUING A FINAL NEGATIVE REPORT Performed at [facility]    Report Status PENDING  Incomplete  Blood culture (routine x 2)     Status: None (Preliminary result)   Collection Time: 05/25/15  2:37 AM  Result Value Ref Range Status   Specimen Description RIGHT ANTECUBITAL  Final   Special Requests BOTTLES DRAWN AEROBIC AND ANAEROBIC  Final   Culture   Final           BLOOD CULTURE RECEIVED NO GROWTH TO DATE CULTURE WILL BE HELD FOR 5 DAYS BEFORE ISSUING A FINAL NEGATIVE REPORT Performed at [facility]    Report Status PENDING  Incomplete    RADIOLOGY STUDIES/RESULTS: Dg Chest 2 View  05/25/2015   CLINICAL DATA:  Acute onset of cough, fever and weakness. Dehydration. Initial encounter.  EXAM: CHEST  2 VIEW  COMPARISON:  Chest radiograph performed 05/30/2014  FINDINGS: The lungs are well-aerated. A small left pleural effusion is noted, with mild left basilar airspace opacity. This may reflect mild asymmetric interstitial edema or possibly pneumonia. Underlying vascular congestion is seen. There is no evidence of pneumothorax.  The heart is borderline normal in size. No acute osseous abnormalities are seen.  IMPRESSION: Small left pleural effusion, with mild left basilar airspace opacity. This may reflect mild asymmetric interstitial edema or possibly pneumonia. Underlying vascular congestion seen.   Electronically Signed   By: Garald Balding M.D.   On: 05/25/2015 02:59   Ct Chest Wo Contrast  05/25/2015   CLINICAL DATA:  Fever post lithotripsy.  Abnormal chest x-ray.  EXAM: CT CHEST WITHOUT CONTRAST  TECHNIQUE: Multidetector CT imaging of the chest was performed following the standard protocol without IV contrast.  COMPARISON:  Chest 05/25/2015  FINDINGS: Mild cardiac enlargement. Calcification in the mitral valve annulus. Normal caliber thoracic aorta with mild calcification. Scattered mediastinal lymph nodes are not pathologically enlarged. Esophagus is decompressed. Moderate-sized esophageal hiatal hernia. Calcified lymph nodes in the left hilum.  Small bilateral pleural effusions with basilar atelectasis. Emphysematous changes in the lungs. Scattered fibrosis. 3 mm pulmonary nodule in the right middle lung. If the patient is at high risk for bronchogenic carcinoma, follow-up chest CT at 1 year is recommended. If the patient is at low risk, no follow-up is needed. This recommendation follows the consensus statement: Guidelines for Management of Small Pulmonary Nodules Detected on CT Scans: A  Statement from the [place] as published  in Radiology 2005; [id number]. No pneumothorax.  Included portions of the upper abdominal organs demonstrate surgical absence of the gallbladder. Calcified granulomas in the spleen. Degenerative changes in the spine. No destructive bone lesions.  IMPRESSION: Small bilateral pleural effusions with bilateral basilar atelectasis. Mild emphysematous changes and fibrosis in the lungs. 3 mm nodule in the right middle lung.   Electronically Signed   By: Lucienne Capers M.D.   On: 05/25/2015 05:22   US Renal  05/25/2015   CLINICAL DATA:  Urinary retention.  Recent lithotripsy  EXAM: RENAL ULTRASOUND  COMPARISON:  CT abdomen and pelvis February 28, 2011  FINDINGS: Right Kidney:  Length: 11.0 cm. Echogenicity and renal cortical thickness are within normal limits. No mass, perinephric fluid, or hydronephrosis visualized. No ureterectasis. There is a small echogenic focus in the lower pole the right kidney which may represent a nonobstructing calculus.  Left Kidney:  Length: 13.9 cm. Echogenicity and renal cortical thickness are within normal limits. No mass, perinephric fluid, or hydronephrosis visualized. Several echogenic foci in the left kidney may represent either small calculi are arcuate arteries. No ureterectasis.  Bladder:  Unable to assess due to decompression with Foley catheter.  IMPRESSION: Suspect small calculus lower pole right kidney. Question small calculi versus arcuate arteries showing echogenicity in the left kidney. No obstructing focus on either side. No perinephric fluid on either side.  Left kidney is larger than right kidney. Significance of this finding is uncertain. This finding potentially could indicate a degree of renal artery stenosis on the right. In this regard, question whether patient is hypertensive.   Electronically Signed   By: Lowella Grip III M.D.   On: 05/25/2015 11:13    Oren Binet, MD  [facility] Pager:[phone]  [phone]  If 7PM-7AM, please contact night-coverage [email or web address] Password [facility] 05/27/2015, 9:33 AM   LOS: 2 days

## 2015-05-27 NOTE — Progress Notes (Signed)
ANTIBIOTIC CONSULT NOTE - INITIAL  Pharmacy Consult for ampicillin Indication: enterococcus UTI  Allergies  Allergen Reactions  . Clarithromycin     REACTION: Headache  . Epinephrine     REACTION: Increased Heart Rate  . Nimodipine     REACTION: [place]    Patient Measurements: Height: 5\' 10"  (177.8 cm) Weight: 204 lb 9.4 oz (92.8 kg) IBW/kg (Calculated) : 73   Vital Signs: Temp: 99.1 F (37.3 C) (06/17 0430) Temp Source: Oral (06/17 0430) BP: 137/88 mmHg (06/17 0430) Pulse Rate: 102 (06/17 0430) Intake/Output from previous day: 06/16 0701 - 06/17 0700 In: 9024 [P.O.:570; I.V.:2465; IV Piggyback:650] Out: 0973 [Urine:1195] Intake/Output from this shift:    Labs:  Recent Labs  05/25/15 0236 05/25/15 0240 05/26/15 0435 05/27/15 0345  WBC 11.8*  --  10.9* 8.4  HGB 13.1 14.3 12.6* 11.6*  PLT 127*  --  111* 113*  CREATININE  --  1.00 1.28* 0.95   Estimated Creatinine Clearance: 72.1 mL/min (by C-G formula based on Cr of 0.95).  Recent Labs  05/26/15 2300  Jessie 17     Microbiology: Recent Results (from the past 720 hour(s))  Urine culture     Status: None   Collection Time: 05/25/15  2:19 AM  Result Value Ref Range Status   Specimen Description URINE, CATHETERIZED  Final   Special Requests Normal  Final   Culture   Final    ENTEROCOCCUS SPECIES Performed at [facility]    Report Status 05/27/2015 FINAL  Final   Organism ID, Bacteria ENTEROCOCCUS SPECIES  Final      Susceptibility   Enterococcus species - MIC*    AMPICILLIN <=2 SENSITIVE Sensitive     LEVOFLOXACIN 1 SENSITIVE Sensitive     NITROFURANTOIN <=16 SENSITIVE Sensitive     VANCOMYCIN 1 SENSITIVE Sensitive     TETRACYCLINE >=16 RESISTANT Resistant     * ENTEROCOCCUS SPECIES  Blood culture (routine x 2)     Status: None (Preliminary result)   Collection Time: 05/25/15  2:37 AM  Result Value Ref Range Status   Specimen Description BLOOD RIGHT FOREARM  Final   Special  Requests BOTTLES DRAWN AEROBIC AND ANAEROBIC  Final   Culture   Final           BLOOD CULTURE RECEIVED NO GROWTH TO DATE CULTURE WILL BE HELD FOR 5 DAYS BEFORE ISSUING A FINAL NEGATIVE REPORT Performed at [facility]    Report Status PENDING  Incomplete  Blood culture (routine x 2)     Status: None (Preliminary result)   Collection Time: 05/25/15  2:37 AM  Result Value Ref Range Status   Specimen Description RIGHT ANTECUBITAL  Final   Special Requests BOTTLES DRAWN AEROBIC AND ANAEROBIC  Final   Culture   Final           BLOOD CULTURE RECEIVED NO GROWTH TO DATE CULTURE WILL BE HELD FOR 5 DAYS BEFORE ISSUING A FINAL NEGATIVE REPORT Performed at [facility]    Report Status PENDING  Incomplete    Medical History: Past Medical History  Diagnosis Date  . Atrial fibrillation Jan 2007    echo 1-07 normal ejection fraction, no significant valvular disease. adenosine cardiolite 1-07  no evidence of ischemia. A 48 hour Holter monitor in 7-07 showed good rate controlw/ chronic atrial fibrillation. Cath 1/11 normal cors. EF 45%. Echo 2-11 60-65%  . Obstructive sleep apnea     noncompliant with CPAP  . Cerebral aneurysm  followed by Dr Estanislado Pandy  . History of chest pain     a. s/p LHC 05/2014 with normal cors  . Obesity   . Fatigue   . Allergy     rhinitis  . Hx of colonic polyps     diverticulosis  . Hyperlipidemia   . Retinal vein occlusion   . Benign prostatic hypertrophy   . IBS (irritable bowel syndrome)   . Incontinence     Per pt 12/14/11  . Kidney stones     Assessment: Patient's a 81 y.o M on vancomycin and zosyn day #3 for urosepsis.  Urine culture now back with enterococcus.  To de-escalate abx to ampicillin per MD's request.  6/15 >> vanc >>6/17 6/15 >> zosyn >> 6/17 6/17 >> ampi >>  Temp: afebrile WBC: down wnl Renal: SCr down 0.95; CrCl 72 CG, UOP 0.5  5/23 urine (from [facility] E. Faecalis - S to AMP, NTF, PCN, vanco 6/15 blood: NGTD 6/15  urine: Enterococcus (R= tetra, S=ampi, LVQ, nitro, vanc)  Plan:  - Ampicillin 1gm IV q6h  Tiffanyann Deroo P 05/27/2015,7:30 AM

## 2015-05-27 NOTE — Progress Notes (Signed)
PHARMACY - VANCOMYCIN (brief follow up note)  Vancomycin trough = 17 mcg/ml (Goal lowered this AM to 10-15 mcg/ml as sepsis resolved and treating UTI now) Current regimen is Vancomycin 1250mg  IV q12h   Plan: Change Vancomycin to 1500mg  IV q24h with noted goal of 10-15 mcg/ml  Follow final sensitivities for urine culture  Continue Zosyn  Leone Haven, PharmD

## 2015-05-27 NOTE — Care Management Note (Signed)
Case Management Note  Patient Details  Name: Maurice Wood MRN: [id number] Date of Birth: [birth year]  Subjective/Objective:  Patient/spouse agree to [place], & dme-rw,3n1.[facility] reps aware of orders.                  Action/Plan:d/c plan home w/HHC/DME.   Expected Discharge Date:                  Expected Discharge Plan:  [place]  In-House Referral:     Discharge planning Services  CM Consult  Post Acute Care Choice:    Choice offered to:  Patient  DME Arranged:  Walker rolling, 3-N-1 DME Agency:  [place]:  RN, PT, OT, Nurse's Aide [place] Agency:  [place]  Status of Service:  In process, will continue to follow  Medicare Important Message Given:  Yes Date Medicare IM Given:  05/26/15 Medicare IM give by:  Dessa Phi Date Additional Medicare IM Given:    Additional Medicare Important Message give by:     If discussed at [place] of Stay Meetings, dates discussed:    Additional Comments:  Dessa Phi, RN 05/27/2015, 12:33 PM

## 2015-05-27 NOTE — Progress Notes (Signed)
Physical Therapy Treatment Patient Details Name: Maurice Wood MRN: [id number] DOB: 18-Feb-1933 Today's Date: 05/27/2015    History of Present Illness 81 y.o. male with PMH of hyperlipidemia, atrial fibrillation, BPH, IBS with chronic mild diarrhea, kidney stone(s/p of right lithotripsy on 05/23/15)and admitted with sepsis secondary to UTI/pyelonephritis    PT Comments    Pt assisted with using BSC then ambulated in hallway.  Spouse declines SNF at this time and plans to take pt home.  She states she will need a RW however would like to obtain [facility] on her own.  Pt and spouse agreeable to HHPT.   Follow Up Recommendations  Supervision/Assistance - 24 hour;Home health PT     Equipment Recommendations  Rolling walker with 5" wheels;3in1 (PT)    Recommendations for Other Services       Precautions / Restrictions Precautions Precautions: Fall Precaution Comments: monitor sats Restrictions Weight Bearing Restrictions: No    Mobility  Bed Mobility Overal bed mobility: Needs Assistance Bed Mobility: Supine to Sit     Supine to sit: Min assist;HOB elevated     General bed mobility comments: verbal cues for technique, assist for L LE over EOB  Transfers Overall transfer level: Needs assistance Equipment used: Rolling walker (2 wheeled) Transfers: Sit to/from [facility] Sit to Stand: Min assist Stand pivot transfers: Min assist       General transfer comment: verbal cues for hand placement, relies on UE for assist, better with RW  Ambulation/Gait Ambulation/Gait assistance: Min assist Ambulation Distance (Feet): 60 Feet Assistive device: Rolling walker (2 wheeled) Gait Pattern/deviations: Step-through pattern;Trunk flexed;Wide base of support Gait velocity: decr   General Gait Details: verbal cues for using RW and staying within RW (tends to keep RW ahead and feet to outside), assist to steady, SPO2 91% on room air during gait   Stairs             Wheelchair Mobility    Modified Rankin (Stroke Patients Only)       Balance                                    Cognition Arousal/Alertness: Awake/alert Behavior During Therapy: WFL for tasks assessed/performed Overall Cognitive Status: Impaired/Different from baseline Area of Impairment: Following commands       Following Commands: Follows one step commands with increased time Safety/Judgement: Decreased awareness of safety          Exercises      General Comments        Pertinent Vitals/Pain Pain Assessment: No/denies pain  SpO2 room air at rest 93% SpO2 on room air during gait 91%    Home Living                      Prior Function            PT Goals (current goals can now be found in the care plan section) Progress towards PT goals: Progressing toward goals    Frequency  Min 3X/week    PT Plan Current plan remains appropriate    Co-evaluation             End of Session Equipment Utilized During Treatment: Gait belt Activity Tolerance: Patient tolerated treatment well Patient left: with call bell/phone within reach;in chair;with family/visitor present (spouse to let staff know if she leaves room)     Time: 9798-9211 PT Time  Calculation (min) (ACUTE ONLY): 23 min  Charges:  $Gait Training: 8-22 mins                    G Codes:      Barnes Florek,[name] 06/20/15, 12:58 PM Carmelia Bake, PT, DPT 2015/06/20 Pager: [phone]

## 2015-05-28 DIAGNOSIS — E876 Hypokalemia: Secondary | ICD-10-CM

## 2015-05-28 LAB — GLUCOSE, CAPILLARY: Glucose-Capillary: 118 mg/dL — ABNORMAL HIGH (ref 65–99)

## 2015-05-28 MED ORDER — TAMSULOSIN HCL 0.4 MG PO CAPS: 0.4000 mg | ORAL_CAPSULE | Freq: Every day | ORAL | Status: DC

## 2015-05-28 MED ORDER — AMOXICILLIN 875 MG PO TABS: 875.0000 mg | ORAL_TABLET | Freq: Two times a day (BID) | ORAL | Status: DC

## 2015-05-28 MED ORDER — AMOXICILLIN 500 MG PO CAPS: 500.0000 mg | ORAL_CAPSULE | Freq: Three times a day (TID) | ORAL | Status: DC

## 2015-05-28 NOTE — Progress Notes (Signed)
Reviewed discharge information with patient and caregiver. Answered all questions. Patient/caregiver able to teach back medications and reasons to contact MD/911. Patient verbalizes importance of PCP follow up appointment.   

## 2015-05-28 NOTE — Care Management Note (Signed)
Case Management Note  Patient Details  Name: Maurice Wood MRN: [id number] Date of Birth: September 16, 1933  Subjective/Objective:     Sepsis             Action/Plan: Home Health  Expected Discharge Date:  05/28/2015              Expected Discharge Plan:  [place]  In-House Referral:     Discharge planning Services  CM Consult  Post Acute Care Choice:    Choice offered to:  Patient  DME Arranged:  [place] Rile rolling, 3-N-1 DME Agency:  [place] Arranged:  RN, PT, OT, Nurse's Aide [place] Agency:  [place]  Status of Service:  Completed, signed off  Medicare Important Message Given:  Yes Date Medicare IM Given:  05/26/15 Medicare IM give by:  Dessa Phi Date Additional Medicare IM Given:    Additional Medicare Important Message give by:     If discussed at [place] of Stay Meetings, dates discussed:    Additional Comments: Contacted [facility] for Home Health for scheduled dc home today.  Erenest Rasher, RN 05/28/2015, 11:37 AM

## 2015-05-28 NOTE — Discharge Summary (Signed)
PATIENT DETAILS Name: Maurice Wood Age: 81 y.o. Sex: male Date of Birth: May 22, 1933 MRN: [id number]. Admitting Physician: Ivor Costa, MD DEY:[id number],EHUDJ [name], MD  Admit Date: 05/25/2015 Discharge date: 05/28/2015  Recommendations for Outpatient Follow-up:  1. Please repeat CBC and BMET at next visit 2. Please follow blood cultures till final-negative at time of discharge 3. Please ensure patient has follow up with Urology  PRIMARY DISCHARGE DIAGNOSIS:  Principal Problem:   Sepsis Active Problems:   HLD (hyperlipidemia)   Atrial fibrillation, permanent   BPH (benign prostatic hyperplasia)   Sleep apnea   Long term current use of anticoagulant   UTI (lower urinary tract infection)   Fever   Cough   Kidney stone   Hypokalemia   Acute urinary retention      PAST MEDICAL HISTORY: Past Medical History  Diagnosis Date  . Atrial fibrillation Jan 2007    echo 1-07 normal ejection fraction, no significant valvular disease. adenosine cardiolite 1-07  no evidence of ischemia. A 48 hour Holter monitor in 7-07 showed good rate controlw/ chronic atrial fibrillation. Cath 1/11 normal cors. EF 45%. Echo 2-11 60-65%  . Obstructive sleep apnea     noncompliant with CPAP  . Cerebral aneurysm     followed by Dr Estanislado Pandy  . History of chest pain     a. s/p LHC 05/2014 with normal cors  . Obesity   . Fatigue   . Allergy     rhinitis  . Hx of colonic polyps     diverticulosis  . Hyperlipidemia   . Retinal vein occlusion   . Benign prostatic hypertrophy   . IBS (irritable bowel syndrome)   . Incontinence     Per pt 12/14/11  . Kidney stones     DISCHARGE MEDICATIONS: Current Discharge Medication List    START taking these medications   Details  tamsulosin (FLOMAX) 0.4 MG CAPS capsule Take 1 capsule (0.4 mg total) by mouth daily. Qty: 30 capsule, Refills: 0      CONTINUE these medications which have CHANGED   Details  amoxicillin (AMOXIL) 875 MG tablet Take 1 tablet (875  mg total) by mouth 2 (two) times daily. Qty: 14 tablet, Refills: 0   Associated Diagnoses: Otitis media not resolved, right      CONTINUE these medications which have NOT CHANGED   Details  acetaminophen (TYLENOL) 650 MG CR tablet Take 1,300 mg by mouth every 8 (eight) hours as needed for pain.    cetirizine (ZYRTEC) 10 MG tablet Take 10 mg by mouth daily.    ELIQUIS 2.5 MG TABS tablet TAKE 1 TABLET TWICE A DAY Qty: 60 tablet, Refills: 12    fluticasone (FLONASE) 50 MCG/ACT nasal spray Place 1 spray into both nostrils daily as needed for allergies.      STOP taking these medications     mometasone (NASONEX) 50 MCG/ACT nasal spray      neomycin-polymyxin-hydrocortisone (CORTISPORIN) otic solution         ALLERGIES:   Allergies  Allergen Reactions  . Clarithromycin     REACTION: Headache  . Epinephrine     REACTION: Increased Heart Rate  . Nimodipine     REACTION: Flushing    BRIEF HPI:  See H&P, Labs, Consult and Test reports for all details in brief, patient is a 81 y.o. male with PMH of hyperlipidemia, atrial fibrillation on at decrease, or before meals (intolerance to CPAP), BPH, IBS with chronic mild diarrhea, kidney stone(s/p of right lithotripsy on 05/23/15),  who presented with a fever, cough, and urinary retention.  CONSULTATIONS:   None  PERTINENT RADIOLOGIC STUDIES: Dg Chest 2 View  05/25/2015   CLINICAL DATA:  Acute onset of cough, fever and weakness. Dehydration. Initial encounter.  EXAM: CHEST  2 VIEW  COMPARISON:  Chest radiograph performed 05/30/2014  FINDINGS: The lungs are well-aerated. A small left pleural effusion is noted, with mild left basilar airspace opacity. This may reflect mild asymmetric interstitial edema or possibly pneumonia. Underlying vascular congestion is seen. There is no evidence of pneumothorax.  The heart is borderline normal in size. No acute osseous abnormalities are seen.  IMPRESSION: Small left pleural effusion, with mild left  basilar airspace opacity. This may reflect mild asymmetric interstitial edema or possibly pneumonia. Underlying vascular congestion seen.   Electronically Signed   By: Garald Balding M.D.   On: 05/25/2015 02:59   Ct Chest Wo Contrast  05/25/2015   CLINICAL DATA:  Fever post lithotripsy.  Abnormal chest x-ray.  EXAM: CT CHEST WITHOUT CONTRAST  TECHNIQUE: Multidetector CT imaging of the chest was performed following the standard protocol without IV contrast.  COMPARISON:  Chest 05/25/2015  FINDINGS: Mild cardiac enlargement. Calcification in the mitral valve annulus. Normal caliber thoracic aorta with mild calcification. Scattered mediastinal lymph nodes are not pathologically enlarged. Esophagus is decompressed. Moderate-sized esophageal hiatal hernia. Calcified lymph nodes in the left hilum.  Small bilateral pleural effusions with basilar atelectasis. Emphysematous changes in the lungs. Scattered fibrosis. 3 mm pulmonary nodule in the right middle lung. If the patient is at high risk for bronchogenic carcinoma, follow-up chest CT at 1 year is recommended. If the patient is at low risk, no follow-up is needed. This recommendation follows the consensus statement: Guidelines for Management of Small Pulmonary Nodules Detected on CT Scans: A Statement from the [employer] as published in Radiology 2005; 237:395-400. No pneumothorax.  Included portions of the upper abdominal organs demonstrate surgical absence of the gallbladder. Calcified granulomas in the spleen. Degenerative changes in the spine. No destructive bone lesions.  IMPRESSION: Small bilateral pleural effusions with bilateral basilar atelectasis. Mild emphysematous changes and fibrosis in the lungs. 3 mm nodule in the right middle lung.   Electronically Signed   By: Lucienne Capers M.D.   On: 05/25/2015 05:22   US Renal  05/25/2015   CLINICAL DATA:  Urinary retention.  Recent lithotripsy  EXAM: RENAL ULTRASOUND  COMPARISON:  CT abdomen and  pelvis February 28, 2011  FINDINGS: Right Kidney:  Length: 11.0 cm. Echogenicity and renal cortical thickness are within normal limits. No mass, perinephric fluid, or hydronephrosis visualized. No ureterectasis. There is a small echogenic focus in the lower pole the right kidney which may represent a nonobstructing calculus.  Left Kidney:  Length: 13.9 cm. Echogenicity and renal cortical thickness are within normal limits. No mass, perinephric fluid, or hydronephrosis visualized. Several echogenic foci in the left kidney may represent either small calculi are arcuate arteries. No ureterectasis.  Bladder:  Unable to assess due to decompression with Foley catheter.  IMPRESSION: Suspect small calculus lower pole right kidney. Question small calculi versus arcuate arteries showing echogenicity in the left kidney. No obstructing focus on either side. No perinephric fluid on either side.  Left kidney is larger than right kidney. Significance of this finding is uncertain. This finding potentially could indicate a degree of renal artery stenosis on the right. In this regard, question whether patient is hypertensive.   Electronically Signed   By: Lowella Grip III M.D.  On: 05/25/2015 11:13     PERTINENT LAB RESULTS: CBC:  Recent Labs  05/26/15 0435 05/27/15 0345  WBC 10.9* 8.4  HGB 12.6* 11.6*  HCT 40.1 36.1*  PLT 111* 113*   CMET CMP     Component Value Date/Time   NA 138 05/27/2015 0345   K 3.3* 05/27/2015 0345   CL 110 05/27/2015 0345   CO2 20* 05/27/2015 0345   GLUCOSE 144* 05/27/2015 0345   BUN 14 05/27/2015 0345   CREATININE 0.95 05/27/2015 0345   CALCIUM 8.2* 05/27/2015 0345   PROT 6.5 05/26/2015 0435   ALBUMIN 3.2* 05/26/2015 0435   AST 19 05/26/2015 0435   ALT 19 05/26/2015 0435   ALKPHOS 60 05/26/2015 0435   BILITOT 1.3* 05/26/2015 0435   GFRNONAA >60 05/27/2015 0345   GFRAA >60 05/27/2015 0345    GFR Estimated Creatinine Clearance: 72.1 mL/min (by C-G formula based on Cr  of 0.95). No results for input(s): LIPASE, AMYLASE in the last 72 hours. No results for input(s): CKTOTAL, CKMB, CKMBINDEX, TROPONINI in the last 72 hours. Invalid input(s): POCBNP No results for input(s): DDIMER in the last 72 hours. No results for input(s): HGBA1C in the last 72 hours. No results for input(s): CHOL, HDL, LDLCALC, TRIG, CHOLHDL, LDLDIRECT in the last 72 hours. No results for input(s): TSH, T4TOTAL, T3FREE, THYROIDAB in the last 72 hours.  Invalid input(s): FREET3 No results for input(s): VITAMINB12, FOLATE, FERRITIN, TIBC, IRON, RETICCTPCT in the last 72 hours. Coags: No results for input(s): INR in the last 72 hours.  Invalid input(s): PT Microbiology: Recent Results (from the past 240 hour(s))  Urine culture     Status: None   Collection Time: 05/25/15  2:19 AM  Result Value Ref Range Status   Specimen Description URINE, CATHETERIZED  Final   Special Requests Normal  Final   Culture   Final    ENTEROCOCCUS SPECIES Performed at [facility]    Report Status 05/27/2015 FINAL  Final   Organism ID, Bacteria ENTEROCOCCUS SPECIES  Final      Susceptibility   Enterococcus species - MIC*    AMPICILLIN <=2 SENSITIVE Sensitive     LEVOFLOXACIN 1 SENSITIVE Sensitive     NITROFURANTOIN <=16 SENSITIVE Sensitive     VANCOMYCIN 1 SENSITIVE Sensitive     TETRACYCLINE >=16 RESISTANT Resistant     * ENTEROCOCCUS SPECIES  Blood culture (routine x 2)     Status: None (Preliminary result)   Collection Time: 05/25/15  2:37 AM  Result Value Ref Range Status   Specimen Description BLOOD RIGHT FOREARM  Final   Special Requests BOTTLES DRAWN AEROBIC AND ANAEROBIC  Final   Culture   Final           BLOOD CULTURE RECEIVED NO GROWTH TO DATE CULTURE WILL BE HELD FOR 5 DAYS BEFORE ISSUING A FINAL NEGATIVE REPORT Performed at [facility]    Report Status PENDING  Incomplete  Blood culture (routine x 2)     Status: None (Preliminary result)   Collection Time:  05/25/15  2:37 AM  Result Value Ref Range Status   Specimen Description RIGHT ANTECUBITAL  Final   Special Requests BOTTLES DRAWN AEROBIC AND ANAEROBIC  Final   Culture   Final           BLOOD CULTURE RECEIVED NO GROWTH TO DATE CULTURE WILL BE HELD FOR 5 DAYS BEFORE ISSUING A FINAL NEGATIVE REPORT Performed at [facility]    Report Status PENDING  Incomplete     BRIEF HOSPITAL COURSE:  Sepsis: Secondary to UTI/pyelonephritis. Recently treated for enterococcus UTI (completed Amoxcillin early June), and also underwent right ureteral stent placement (05/05/15) along with lithotripsy for a large right renal calculi (05/23/15).Admitted and started on IV Vancomycin and Zosyn, patient gradually improved. Once sepsis pathophysiology improved and urine culture grew enterococcus (pan sensitive-except to tetracycline),antibiotics were narrowed to just Ampicillin. Since continues to improve, and now afebrile for >48 hours, we will switch to oral amoxicillin on discharge for one more week.  Family has already made follow up appt with Urology at [facility] next week.  Active Problems: Probable pyelonephritis: See above.  Acute encephalopathy:Resolved. Awake and completely alert at discharge. Secondary to above.  Acute urinary retention: Continue Flomax, Foley catheter placed on admission. Successful voiding trial on 6/17.   XTG:GYIRSW pre-renal azotemia and urinary retention-Resolved with supportive care.Renal Ultrasound neg for hydronephroses  Hypokalemia: Repleted, please recheck at follow up visit with PCP  Thrombocytopenia:secondary to sepsis-follow CBC at visit with PCP  Atrial fibrillation:CHA2DS2-VASc Score is 2- continue Eliquis,not on any rate controlling agents. Monitored in telemetry.  TODAY-DAY OF DISCHARGE:  Subjective:   Honor Loh today has no headache,no chest abdominal pain,no new weakness tingling or numbness, feels much better wants to go home today.   Objective:    Blood pressure 128/93, pulse 105, temperature 98.6 F (37 C), temperature source Oral, resp. rate 18, height 5\' 10"  (1.778 m), weight 92.8 kg (204 lb 9.4 oz), SpO2 92 %.  Intake/Output Summary (Last 24 hours) at 05/28/15 0849 Last data filed at 05/27/15 1440  Gross per 24 hour  Intake    700 ml  Output      0 ml  Net    700 ml   Filed Weights   05/25/15 0149 05/25/15 0600  Weight: 86.183 kg (190 lb) 92.8 kg (204 lb 9.4 oz)    Exam Awake Alert, Oriented *3, No new F.N deficits, Normal affect [place].AT,PERRAL Supple Neck,No JVD, No cervical lymphadenopathy appriciated.  Symmetrical Chest wall movement, Good air movement bilaterally, CTAB RRR,No Gallops,Rubs or new Murmurs, No Parasternal Heave +ve B.Sounds, Abd Soft, Non tender, No organomegaly appriciated, No rebound -guarding or rigidity. No Cyanosis, Clubbing or edema, No new Rash or bruise  DISCHARGE CONDITION: Stable  DISPOSITION: Home with home health services(refused SNF)  DISCHARGE INSTRUCTIONS:    Activity:  As tolerated with Full fall precautions use walker/cane & assistance as needed  Diet recommendation: Heart Healthy diet  Discharge Instructions    Call MD for:  persistant nausea and vomiting    Complete by:  As directed      Call MD for:  temperature >100.4    Complete by:  As directed      Diet - low sodium heart healthy    Complete by:  As directed      Increase activity slowly    Complete by:  As directed            Follow-up Information    Go to Chancy Hurter, MD.   Specialties:  Internal Medicine, Radiology   Why:  your appointment is on 06/03/15 at 2pm   Contact information:   [place] [address] [phone]       Follow up with [facility].   Why:  HHRN/PT/OT/Nurse's aide   Contact information:   [address] [place]  [address] [phone]       Follow up with [facility].   Why:  rolling walker;3n1    Contact information:   [address] [place] [address] [phone]       Follow up with Burman Foster, MD. Schedule an appointment as soon as possible for a visit in 1 week.   Specialty:  Urology   Contact information:   [address] [place] [address] [phone]       Total Time spent on discharge equals 45 minutes.  SignedOren Binet 05/28/2015 8:49 AM

## 2015-05-31 LAB — CULTURE, BLOOD (ROUTINE X 2)
Culture: NO GROWTH
Culture: NO GROWTH

## 2015-06-02 ENCOUNTER — Ambulatory Visit (HOSPITAL_COMMUNITY)
Admission: RE | Admit: 2015-06-02 | Discharge: 2015-06-02 | Disposition: A | Discharge status: Home / Self Care | Payer: PPO | Source: Ambulatory Visit | Attending: Internal Medicine | Admitting: Internal Medicine

## 2015-06-02 ENCOUNTER — Encounter (HOSPITAL_COMMUNITY): Payer: Self-pay

## 2015-06-02 VITALS — BP 126/70 | HR 94 | Wt 198.5 lb

## 2015-06-02 DIAGNOSIS — I671 Cerebral aneurysm, nonruptured: Secondary | ICD-10-CM | POA: Insufficient documentation

## 2015-06-02 DIAGNOSIS — G4733 Obstructive sleep apnea (adult) (pediatric): Secondary | ICD-10-CM | POA: Diagnosis not present

## 2015-06-02 DIAGNOSIS — E785 Hyperlipidemia, unspecified: Secondary | ICD-10-CM | POA: Insufficient documentation

## 2015-06-02 DIAGNOSIS — Z7901 Long term (current) use of anticoagulants: Secondary | ICD-10-CM | POA: Insufficient documentation

## 2015-06-02 DIAGNOSIS — N390 Urinary tract infection, site not specified: Secondary | ICD-10-CM | POA: Insufficient documentation

## 2015-06-02 DIAGNOSIS — R600 Localized edema: Secondary | ICD-10-CM

## 2015-06-02 DIAGNOSIS — K589 Irritable bowel syndrome without diarrhea: Secondary | ICD-10-CM | POA: Diagnosis not present

## 2015-06-02 DIAGNOSIS — I4821 Permanent atrial fibrillation: Secondary | ICD-10-CM

## 2015-06-02 DIAGNOSIS — N12 Tubulo-interstitial nephritis, not specified as acute or chronic: Secondary | ICD-10-CM | POA: Insufficient documentation

## 2015-06-02 DIAGNOSIS — I10 Essential (primary) hypertension: Secondary | ICD-10-CM | POA: Diagnosis not present

## 2015-06-02 DIAGNOSIS — N400 Enlarged prostate without lower urinary tract symptoms: Secondary | ICD-10-CM | POA: Insufficient documentation

## 2015-06-02 DIAGNOSIS — I482 Chronic atrial fibrillation: Secondary | ICD-10-CM | POA: Diagnosis not present

## 2015-06-02 DIAGNOSIS — Z79899 Other long term (current) drug therapy: Secondary | ICD-10-CM | POA: Diagnosis not present

## 2015-06-02 MED ORDER — POTASSIUM CHLORIDE CRYS ER 20 MEQ PO TBCR: 20.0000 meq | EXTENDED_RELEASE_TABLET | Freq: Every day | ORAL | Status: DC | PRN

## 2015-06-02 MED ORDER — FUROSEMIDE 20 MG PO TABS: 20.0000 mg | ORAL_TABLET | Freq: Every day | ORAL | Status: DC | PRN

## 2015-06-02 NOTE — Patient Instructions (Addendum)
Refills sent in for lasix and potassium to [facility]. Take 1 tablet of EACH onc edaily as needed for weight gain/edema.  Follow up 1 year.  Do the following things EVERYDAY: 1) Weigh yourself in the morning before breakfast. Write it down and keep it in a log. 2) Take your medicines as prescribed 3) Eat low salt foods-Limit salt (sodium) to 2000 mg per day.  4) Stay as active as you can everyday 5) Limit all fluids for the day to less than 2 liters

## 2015-06-02 NOTE — Addendum Note (Signed)
Encounter addended by: Effie Berkshire, RN on: 06/02/2015  1:10 PM<BR>     Documentation filed: Patient Instructions Section, Orders

## 2015-06-02 NOTE — Progress Notes (Signed)
Patient ID: Maurice Wood, male   DOB: September 02, 1933, 81 y.o.   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  Had episode of CP in January 2011 and underwent cath which showed normal coronaries with ef 45% (? artificially low due to AF). Echo  2/11: 60-65%  Echo 8/13 - Left ventricle: The cavity size was normal. Wall thickness was increased in a pattern of mild LVH. Systolic function was vigorous. The estimated ejection fraction was in the range of 65% to 70%. Wall motion was normal; there were no regional wall motion abnormalities. - Aortic valve: Mild regurgitation. - Mitral valve: Mild to moderate regurgitation. - Left atrium: The atrium was mildly dilated. - Right atrium: The atrium was mildly dilated.  Evaluated at [facility] 09/06/13 with chest pain. CEs and ECG negative. (ECG with minimal nonspecific ST abnormality v4-v6)  Sharp pain on left side of his chest while at church. Lasted about 90 minutes and resolved. Received aspirin and nitroglycerin.   Here for f/u. Secondary to UTI/pyelonephritis. Recently admitted for enterococcus UTI and urosepsis.Had right ureteral stent placement (05/05/15) along with lithotripsy for a large right renal calculi (05/23/15). Has follow up appt with Urology at [facility] Weight was up 8 pounds but now back down to near baseline. Stamina much improved. Still using a walker. Has HHPT. Denies dyspnea, CP or palpitations.    ROS: All systems negative except as listed in HPI, PMH and Problem List.  Past Medical History  Diagnosis Date  . Atrial fibrillation Jan 2007    echo 1-07 normal ejection fraction, no significant valvular disease. adenosine cardiolite 1-07  no evidence of ischemia. A 48 hour Holter monitor in 7-07  showed good rate controlw/ chronic atrial fibrillation. Cath 1/11 normal cors. EF 45%. Echo 2-11 60-65%  . Obstructive sleep apnea     noncompliant with CPAP  . Cerebral aneurysm     followed by Dr Estanislado Pandy  . History of chest pain     a. s/p LHC 05/2014 with normal cors  . Obesity   . Fatigue   . Allergy     rhinitis  . Hx of colonic polyps     diverticulosis  . Hyperlipidemia   . Retinal vein occlusion   . Benign prostatic hypertrophy   . IBS (irritable bowel syndrome)   . Incontinence     Per pt 12/14/11  . Kidney stones     Current Outpatient Prescriptions  Medication Sig Dispense Refill  . acetaminophen (TYLENOL) 650 MG CR tablet Take 1,300 mg by mouth every 8 (eight) hours as needed for pain.    Marland Kitchen amoxicillin (AMOXIL) 875 MG tablet Take 1 tablet (875 mg total) by mouth 2 (two) times daily. 14 tablet 0  . cetirizine (ZYRTEC) 10 MG tablet Take 10 mg by mouth daily.    Marland Kitchen ELIQUIS 2.5 MG TABS tablet TAKE 1 TABLET TWICE A DAY 60 tablet 12  . fluticasone (FLONASE) 50 MCG/ACT nasal spray Place 1 spray into both nostrils daily as needed for allergies.    . furosemide (LASIX) 20 MG tablet Take 20 mg by mouth daily.    . potassium chloride SA (K-DUR,KLOR-CON) 20 MEQ tablet Take 20 mEq by mouth once.    . tamsulosin (FLOMAX) 0.4 MG CAPS capsule Take 1 capsule (0.4 mg total) by  mouth daily. 30 capsule 0   No current facility-administered medications for this encounter.     PHYSICAL EXAM: Filed Vitals:   06/02/15 1214  BP: 126/70  Pulse: 94    General:  Well appearing. No resp difficulty HEENT: normal Neck: supple. JVP 7Carotids 2+ bilaterally; no bruits. No lymphadenopathy or thryomegaly appreciated. Cor: PMI normal. Irregular rate & rhythm. No rubs, gallops or murmurs. Lungs: clear Abdomen: soft, nontender, nondistended. No hepatosplenomegaly. No bruits or masses. Good bowel sounds. Extremities: no cyanosis, clubbing, rash, 2+ edema Neuro: alert & orientedx3, cranial nerves  grossly intact. Moves all 4 extremities w/o difficulty. Affect pleasant.   Lab Results  Component Value Date   CHOL 166 02/24/2014   HDL 53.80 02/24/2014   LDLCALC 84 02/24/2014   LDLDIRECT 97.4 11/09/2011   TRIG 143.0 02/24/2014   CHOLHDL 3 02/24/2014    ASSESSMENT & PLAN:  1. A-fib -  Chronic. Rate controlled. -On eliquis 2.5 mg twice a day. Will check ECG today  2. LE edema  - he got 7 day course of lasix after d/c from hospital he has taken 3 days. Will complete the course and we will give him a prescription for more lasix and potassium as needed  - will repeat echo particularly in light of recent enterococcal urosepsis.   Glori Bickers MD 12:48 PM

## 2015-06-03 ENCOUNTER — Ambulatory Visit: Payer: PPO | Admitting: Adult Health

## 2015-06-10 ENCOUNTER — Other Ambulatory Visit (HOSPITAL_COMMUNITY): Payer: Self-pay | Admitting: *Deleted

## 2015-06-10 ENCOUNTER — Telehealth (HOSPITAL_COMMUNITY): Payer: Self-pay | Admitting: Vascular Surgery

## 2015-06-10 ENCOUNTER — Ambulatory Visit (HOSPITAL_COMMUNITY)
Admission: RE | Admit: 2015-06-10 | Discharge: 2015-06-10 | Disposition: A | Discharge status: Home / Self Care | Payer: PPO | Source: Ambulatory Visit | Attending: Internal Medicine | Admitting: Internal Medicine

## 2015-06-10 DIAGNOSIS — I482 Chronic atrial fibrillation, unspecified: Secondary | ICD-10-CM

## 2015-06-10 DIAGNOSIS — I517 Cardiomegaly: Secondary | ICD-10-CM | POA: Diagnosis not present

## 2015-06-10 DIAGNOSIS — I059 Rheumatic mitral valve disease, unspecified: Secondary | ICD-10-CM | POA: Insufficient documentation

## 2015-06-10 DIAGNOSIS — I351 Nonrheumatic aortic (valve) insufficiency: Secondary | ICD-10-CM | POA: Insufficient documentation

## 2015-06-10 DIAGNOSIS — I340 Nonrheumatic mitral (valve) insufficiency: Secondary | ICD-10-CM | POA: Insufficient documentation

## 2015-06-10 DIAGNOSIS — I071 Rheumatic tricuspid insufficiency: Secondary | ICD-10-CM | POA: Insufficient documentation

## 2015-06-10 DIAGNOSIS — I4891 Unspecified atrial fibrillation: Secondary | ICD-10-CM | POA: Diagnosis present

## 2015-06-10 MED ORDER — APIXABAN 2.5 MG PO TABS: 2.5000 mg | ORAL_TABLET | Freq: Two times a day (BID) | ORAL | Status: DC

## 2015-06-10 NOTE — Telephone Encounter (Signed)
Pt needs refill Eliquis ASAP.Marland Kitchen @ [facility]

## 2015-06-10 NOTE — Progress Notes (Signed)
Echocardiogram 2D Echocardiogram has been performed.  Tresa Res 06/10/2015, 11:58 AM

## 2015-11-11 ENCOUNTER — Ambulatory Visit (INDEPENDENT_AMBULATORY_CARE_PROVIDER_SITE_OTHER): Payer: PPO | Admitting: Family Medicine

## 2015-11-11 VITALS — BP 98/62 | HR 88 | Temp 98.3°F | Resp 18 | Ht 70.0 in | Wt 188.0 lb

## 2015-11-11 DIAGNOSIS — R509 Fever, unspecified: Secondary | ICD-10-CM

## 2015-11-11 DIAGNOSIS — J029 Acute pharyngitis, unspecified: Secondary | ICD-10-CM

## 2015-11-11 DIAGNOSIS — Z87440 Personal history of urinary (tract) infections: Secondary | ICD-10-CM | POA: Diagnosis not present

## 2015-11-11 DIAGNOSIS — Z87448 Personal history of other diseases of urinary system: Secondary | ICD-10-CM

## 2015-11-11 LAB — POCT CBC
Granulocyte percent: 79.2 %G (ref 37–80)
HCT, POC: 43.8 % (ref 43.5–53.7)
HEMOGLOBIN: 15 g/dL (ref 14.1–18.1)
Lymph, poc: 1.1 (ref 0.6–3.4)
MCH, POC: 29.3 pg (ref 27–31.2)
MCHC: 34.2 g/dL (ref 31.8–35.4)
MCV: 85.5 fL (ref 80–97)
MID (CBC): 0.8 (ref 0–0.9)
MPV: 7.3 fL (ref 0–99.8)
PLATELET COUNT, POC: 149 10*3/uL (ref 142–424)
POC Granulocyte: 7.3 — AB (ref 2–6.9)
POC LYMPH PERCENT: 12.4 %L (ref 10–50)
POC MID %: 8.4 %M (ref 0–12)
RBC: 5.11 M/uL (ref 4.69–6.13)
RDW, POC: 14.8 %
WBC: 9.2 10*3/uL (ref 4.6–10.2)

## 2015-11-11 LAB — POCT URINALYSIS DIP (MANUAL ENTRY)
BILIRUBIN UA: NEGATIVE
Glucose, UA: NEGATIVE
Ketones, POC UA: NEGATIVE
Leukocytes, UA: NEGATIVE
Nitrite, UA: NEGATIVE
Protein Ur, POC: NEGATIVE
Spec Grav, UA: 1.02
Urobilinogen, UA: 0.2
pH, UA: 6

## 2015-11-11 LAB — POC MICROSCOPIC URINALYSIS (UMFC): Mucus: ABSENT

## 2015-11-11 LAB — POCT INFLUENZA A/B
INFLUENZA A, POC: NEGATIVE
INFLUENZA B, POC: NEGATIVE

## 2015-11-11 LAB — POCT RAPID STREP A (OFFICE): Rapid Strep A Screen: NEGATIVE

## 2015-11-11 NOTE — Progress Notes (Signed)
[facility] [address], [place] [address] [phone]  Date:  11/11/2015   Name:  Maurice Wood   DOB:  20-Sep-1933   MRN:  [id number]  PCP:  Gara Kroner, MD    Chief Complaint: Sore Throat; Fever; and Follow-up   History of Present Illness:  Maurice Wood is a 81 y.o. very pleasant male patient who presents with the following:  Here today for a follow-up visit.  He was in the hospital for 3 days in June for urosepsis.  He has felt a little bit ill and they are going out of town this weekend- just wants to be sure all is well before they leave.    He has noted a ST, mild fever last night.  He has noted the ST for about 2 days.  His temp was 99.6 they think yesterday.   He does have a mild cough and sinus drainage No body aches or chills Yesterday he "just felt bad."  Feels somewhat better today No belly pain or back pain.   He has not noted any urinary sx but would like for [place] to recheck his urine today.    He did take some tylenol last night.    Patient Active Problem List   Diagnosis Date Noted  . Bilateral lower extremity edema 06/02/2015  . UTI (lower urinary tract infection) 05/25/2015  . Sepsis ([place]) 05/25/2015  . Fever 05/25/2015  . Cough 05/25/2015  . Kidney stone 05/25/2015  . Hypokalemia 05/25/2015  . Acute urinary retention   . H/O cardiac catheterizationn 12/2009 with normal coronary arteries 05/30/2014  . Low back pain 12/17/2013  . Chest pain 09/10/2013  . Urinary incontinence 06/04/2011  . Long term current use of anticoagulant 01/12/2011  . DM type 2 (diabetes mellitus, type 2) ([place]) 07/31/2010  . HEMORRHOIDS-INTERNAL 06/27/2010  . Sleep apnea 06/27/2010  . DIVERTICULOSIS OF COLON 01/03/2010  . GERD 11/22/2009  . IRRITABLE BOWEL SYNDROME 11/22/2009  . ERECTILE DYSFUNCTION 06/06/2009  . BPH (benign prostatic hyperplasia) 06/18/2008  . PSA, INCREASED 12/19/2007  . OBSTRUCTIVE SLEEP APNEA 07/24/2007  . DISSECTION, CAROTID  ARTERY 07/24/2007  . TESTOSTERONE DEFICIENCY 07/23/2007  . HLD (hyperlipidemia) 07/23/2007  . COLONIC POLYPS, HX OF 07/23/2007  . Atrial fibrillation, permanent ([place]) 07/14/2007    Past Medical History  Diagnosis Date  . Atrial fibrillation [name]) Jan 2007    echo 1-07 normal ejection fraction, no significant valvular disease. adenosine cardiolite 1-07  no evidence of ischemia. A 48 hour Holter monitor in 7-07 showed good rate controlw/ chronic atrial fibrillation. Cath 1/11 normal cors. EF 45%. Echo 2-11 60-65%  . Obstructive sleep apnea     noncompliant with CPAP  . Cerebral aneurysm     followed by Dr Estanislado Pandy  . History of chest pain     a. s/p LHC 05/2014 with normal cors  . Obesity   . Fatigue   . Allergy     rhinitis  . Hx of colonic polyps     diverticulosis  . Hyperlipidemia   . Retinal vein occlusion   . Benign prostatic hypertrophy   . IBS (irritable bowel syndrome)   . Incontinence     Per pt 12/14/11  . Kidney stones     Past Surgical History  Procedure Laterality Date  . Cholecystectomy    . Inguinal and umbilical herniorrhaphy    . Tonsillectomy and adenoidectomy  as a child  . Cardiac catheterization  1999  . Hernia  repair    . Left heart catheterization with coronary angiogram N/A 06/01/2014    Procedure: LEFT HEART CATHETERIZATION WITH CORONARY ANGIOGRAM;  Surgeon: Peter M Martinique, MD;  Location: [facility] CATH LAB;  Service: Cardiovascular;  Laterality: N/A;  . Lithotripsy      Social History  Substance Use Topics  . Smoking status: Former Smoker    Types: Cigarettes  . Smokeless tobacco: Never Used     Comment: quit 1973  . Alcohol Use: 0.6 oz/week    1 Cans of beer per week     Comment: Twice a month    Family History  Problem Relation Age of Onset  . Cancer Father     lung  . Alcohol abuse Father   . Aneurysm Father     Aortic aneurysm  . Diabetes Son   . Cancer Maternal Grandmother     colon  . Diabetes Maternal Uncle   . Diabetes Paternal  Uncle   . Sudden death Mother   . Heart disease Paternal Grandfather     Allergies  Allergen Reactions  . Clarithromycin     REACTION: Headache  . Epinephrine     REACTION: Increased Heart Rate  . Nimodipine     REACTION: Flushing    Medication list has been reviewed and updated.  Current Outpatient Prescriptions on File Prior to Visit  Medication Sig Dispense Refill  . acetaminophen (TYLENOL) 650 MG CR tablet Take 1,300 mg by mouth every 8 (eight) hours as needed for pain.    Marland Kitchen apixaban (ELIQUIS) 2.5 MG TABS tablet Take 1 tablet (2.5 mg total) by mouth 2 (two) times daily. 60 tablet 12  . cetirizine (ZYRTEC) 10 MG tablet Take 10 mg by mouth daily.    . fluticasone (FLONASE) 50 MCG/ACT nasal spray Place 1 spray into both nostrils daily as needed for allergies.    . furosemide (LASIX) 20 MG tablet Take 1 tablet (20 mg total) by mouth daily as needed for fluid or edema. (Patient not taking: Reported on 11/11/2015) 30 tablet 3  . potassium chloride SA (K-DUR,KLOR-CON) 20 MEQ tablet Take 1 tablet (20 mEq total) by mouth daily as needed (take as needed when you take lasix). (Patient not taking: Reported on 11/11/2015) 30 tablet 3   No current facility-administered medications on file prior to visit.    Review of Systems:  As per HPI- otherwise negative.   Physical Examination: Filed Vitals:   11/11/15 0936  BP: 90/62  Pulse: 88  Temp: 98.3 F (36.8 C)  Resp: 18   Filed Vitals:   11/11/15 0936  Height: 5\' 10"  (1.778 m)  Weight: 188 lb (85.276 kg)   Body mass index is 26.98 kg/(m^2). Ideal Body Weight: Weight in (lb) to have BMI = 25: 173.9  GEN: WDWN, NAD, Non-toxic, A & O x 3, mild overweight, looks well HEENT: Atraumatic, Normocephalic. Neck supple. No masses, No LAD.  Bilateral TM wnl, oropharynx normal.  PEERL,EOMI.   Ears and Nose: No external deformity. CV: RRR, No M/G/R. No JVD. No thrill. No extra heart sounds. PULM: CTA B, no wheezes, crackles, rhonchi. No  retractions. No resp. distress. No accessory muscle use. ABD: S, NT, ND. No rebound. No HSM.  Benign abd EXTR: No c/c/e NEURO Normal gait.  PSYCH: Normally interactive. Conversant. Not depressed or anxious appearing.  Calm demeanor.   Results for orders placed or performed in visit on 11/11/15  POCT CBC  Result Value Ref Range   WBC 9.2 4.6 - 10.2  K/uL   Lymph, poc 1.1 0.6 - 3.4   POC LYMPH PERCENT 12.4 10 - 50 %L   MID (cbc) 0.8 0 - 0.9   POC MID % 8.4 0 - 12 %M   POC Granulocyte 7.3 (A) 2 - 6.9   Granulocyte percent 79.2 37 - 80 %G   RBC 5.11 4.69 - 6.13 M/uL   Hemoglobin 15.0 14.1 - 18.1 g/dL   HCT, POC 43.8 43.5 - 53.7 %   MCV 85.5 80 - 97 fL   MCH, POC 29.3 27 - 31.2 pg   MCHC 34.2 31.8 - 35.4 g/dL   RDW, POC 14.8 %   Platelet Count, POC 149 142 - 424 K/uL   MPV 7.3 0 - 99.8 fL  POCT rapid strep A  Result Value Ref Range   Rapid Strep A Screen Negative Negative  POCT Influenza A/B  Result Value Ref Range   Influenza A, POC Negative Negative   Influenza B, POC Negative Negative  POCT urinalysis dipstick  Result Value Ref Range   Color, UA yellow yellow   Clarity, UA clear clear   Glucose, UA negative negative   Bilirubin, UA negative negative   Ketones, POC UA negative negative   Spec Grav, UA 1.020    Blood, UA trace-intact (A) negative   pH, UA 6.0    Protein Ur, POC negative negative   Urobilinogen, UA 0.2    Nitrite, UA Negative Negative   Leukocytes, UA Negative Negative  POCT Microscopic Urinalysis ([facility])  Result Value Ref Range   WBC,UR,HPF,POC Few (A) None WBC/hpf   RBC,UR,HPF,POC None None RBC/hpf   Bacteria None None, Too numerous to count   Mucus Absent Absent   Epithelial Cells, UR Per Microscopy Few (A) None, Too numerous to count cells/hpf   BP Readings from Last 3 Encounters:  11/11/15 90/62  06/02/15 126/70  05/27/15 128/93     Assessment and Plan: Low grade fever - Plan: POCT CBC, POCT rapid strep A, POCT Influenza A/B  Sore throat  - Plan: POCT rapid strep A, POCT Influenza A/B  History of pyelonephritis - Plan: POCT urinalysis dipstick, POCT Microscopic Urinalysis ([facility]), Urine culture  Here today with sx of illness.  Reassured that it does not appear he has anything serious but he will keep a close eye on his sx Cultured urine today to be sure all is ok Noted borderline low BP- he states that he occasionally feels a little lightheaded but this is not new and is not worse recently.  No CP or SOB He will call me if any other concerns or if getting worse, will be in touch with urine culture result  Signed Lamar Blinks, MD

## 2015-11-11 NOTE — Patient Instructions (Signed)
Your labs all look fine- it appears that you have a cold. Flu and strep tests are negative.  Call me if you get worse while on your trip Your urine tests so far look fine- I will check a culture and be back in touch with you.   It is certainly ok to use tylenol as needed for your symptoms.

## 2015-11-12 LAB — URINE CULTURE
Colony Count: NO GROWTH
Organism ID, Bacteria: NO GROWTH

## 2015-11-25 ENCOUNTER — Telehealth (HOSPITAL_COMMUNITY): Payer: Self-pay | Admitting: Cardiology

## 2015-11-25 NOTE — Telephone Encounter (Signed)
Patients insurance will be changing after the first of the year Request new rx for eliquis in a 36 supply mailed to home   Done, rx mailed to patient and detailed message left for wife

## 2015-12-26 DIAGNOSIS — Z974 Presence of external hearing-aid: Secondary | ICD-10-CM | POA: Diagnosis not present

## 2015-12-26 DIAGNOSIS — Z9622 Myringotomy tube(s) status: Secondary | ICD-10-CM | POA: Diagnosis not present

## 2015-12-26 DIAGNOSIS — H9121 Sudden idiopathic hearing loss, right ear: Secondary | ICD-10-CM | POA: Diagnosis not present

## 2015-12-26 DIAGNOSIS — H9041 Sensorineural hearing loss, unilateral, right ear, with unrestricted hearing on the contralateral side: Secondary | ICD-10-CM | POA: Diagnosis not present

## 2015-12-26 DIAGNOSIS — J310 Chronic rhinitis: Secondary | ICD-10-CM | POA: Diagnosis not present

## 2016-01-06 DIAGNOSIS — J309 Allergic rhinitis, unspecified: Secondary | ICD-10-CM | POA: Diagnosis not present

## 2016-01-06 DIAGNOSIS — H1045 Other chronic allergic conjunctivitis: Secondary | ICD-10-CM | POA: Diagnosis not present

## 2016-01-06 DIAGNOSIS — J3089 Other allergic rhinitis: Secondary | ICD-10-CM | POA: Diagnosis not present

## 2016-01-09 DIAGNOSIS — R109 Unspecified abdominal pain: Secondary | ICD-10-CM | POA: Diagnosis not present

## 2016-01-10 DIAGNOSIS — N200 Calculus of kidney: Secondary | ICD-10-CM | POA: Diagnosis not present

## 2016-04-03 DIAGNOSIS — Z961 Presence of intraocular lens: Secondary | ICD-10-CM | POA: Insufficient documentation

## 2016-04-03 DIAGNOSIS — H43813 Vitreous degeneration, bilateral: Secondary | ICD-10-CM | POA: Diagnosis not present

## 2016-04-03 DIAGNOSIS — H35352 Cystoid macular degeneration, left eye: Secondary | ICD-10-CM | POA: Diagnosis not present

## 2016-04-03 DIAGNOSIS — H348322 Tributary (branch) retinal vein occlusion, left eye, stable: Secondary | ICD-10-CM | POA: Diagnosis not present

## 2016-04-03 DIAGNOSIS — H35372 Puckering of macula, left eye: Secondary | ICD-10-CM | POA: Diagnosis not present

## 2016-04-20 DIAGNOSIS — R911 Solitary pulmonary nodule: Secondary | ICD-10-CM | POA: Diagnosis not present

## 2016-04-20 DIAGNOSIS — N3289 Other specified disorders of bladder: Secondary | ICD-10-CM | POA: Diagnosis not present

## 2016-04-20 DIAGNOSIS — H349 Unspecified retinal vascular occlusion: Secondary | ICD-10-CM | POA: Diagnosis not present

## 2016-04-20 DIAGNOSIS — H9191 Unspecified hearing loss, right ear: Secondary | ICD-10-CM | POA: Diagnosis not present

## 2016-04-20 DIAGNOSIS — R197 Diarrhea, unspecified: Secondary | ICD-10-CM | POA: Diagnosis not present

## 2016-04-20 DIAGNOSIS — I482 Chronic atrial fibrillation: Secondary | ICD-10-CM | POA: Diagnosis not present

## 2016-04-20 DIAGNOSIS — N3281 Overactive bladder: Secondary | ICD-10-CM | POA: Diagnosis not present

## 2016-04-24 DIAGNOSIS — H5711 Ocular pain, right eye: Secondary | ICD-10-CM | POA: Diagnosis not present

## 2016-04-27 DIAGNOSIS — H52203 Unspecified astigmatism, bilateral: Secondary | ICD-10-CM | POA: Diagnosis not present

## 2016-04-27 DIAGNOSIS — H5711 Ocular pain, right eye: Secondary | ICD-10-CM | POA: Diagnosis not present

## 2016-04-27 DIAGNOSIS — Z961 Presence of intraocular lens: Secondary | ICD-10-CM | POA: Diagnosis not present

## 2016-06-05 DIAGNOSIS — H903 Sensorineural hearing loss, bilateral: Secondary | ICD-10-CM | POA: Diagnosis not present

## 2016-06-05 DIAGNOSIS — H9201 Otalgia, right ear: Secondary | ICD-10-CM | POA: Diagnosis not present

## 2016-06-05 DIAGNOSIS — H6123 Impacted cerumen, bilateral: Secondary | ICD-10-CM | POA: Diagnosis not present

## 2016-06-05 DIAGNOSIS — H9202 Otalgia, left ear: Secondary | ICD-10-CM | POA: Insufficient documentation

## 2016-06-21 DIAGNOSIS — R197 Diarrhea, unspecified: Secondary | ICD-10-CM | POA: Diagnosis not present

## 2016-07-09 DIAGNOSIS — N3941 Urge incontinence: Secondary | ICD-10-CM | POA: Diagnosis not present

## 2016-07-09 DIAGNOSIS — R109 Unspecified abdominal pain: Secondary | ICD-10-CM | POA: Diagnosis not present

## 2016-07-21 ENCOUNTER — Encounter: Payer: Self-pay | Admitting: Family Medicine

## 2016-07-21 ENCOUNTER — Ambulatory Visit (INDEPENDENT_AMBULATORY_CARE_PROVIDER_SITE_OTHER): Payer: PPO | Admitting: Family Medicine

## 2016-07-21 VITALS — BP 110/68 | HR 84 | Temp 98.1°F | Resp 16 | Ht 69.0 in | Wt 190.0 lb

## 2016-07-21 DIAGNOSIS — J301 Allergic rhinitis due to pollen: Secondary | ICD-10-CM

## 2016-07-21 DIAGNOSIS — J0111 Acute recurrent frontal sinusitis: Secondary | ICD-10-CM | POA: Diagnosis not present

## 2016-07-21 MED ORDER — AMOXICILLIN 500 MG PO CAPS: 1000.0000 mg | ORAL_CAPSULE | Freq: Two times a day (BID) | ORAL | 0 refills | Status: DC

## 2016-07-21 MED ORDER — FLUTICASONE PROPIONATE 50 MCG/ACT NA SUSP: 2.0000 | Freq: Every day | NASAL | 12 refills | Status: DC

## 2016-07-21 NOTE — Progress Notes (Signed)
Subjective:  By signing my name below, I, Maurice Wood, attest that this documentation has been prepared under the direction and in the presence of Maurice Forts, MD.  Electronically Signed: Thea Wood, ED Scribe. 07/21/2016. 10:08 AM.   Patient ID: Maurice Wood, male    DOB: 27-Oct-1933, 82 y.o.   MRN: [id number]  HPI Chief Complaint  Patient presents with  . Nasal Congestion    HPI Comments: Maurice Wood is a 82 y.o. male who [facility] complaining of sinus congestion that began 4-5 days ago. Pt had similar symptoms a couple weeks ago after cutting the grass without a mask. States symptoms resolved but returned at the beginning of this week. He reports associated sneezing, headache, clear, rhinorrhea and intermittent sinus pressure. He takes zyrtec and Claritin interchangeably for the past 5 days. He also took sudafed this morning; he has hx of afib. He has flonase at home but does not use this much.  He denies fever, chills, eye irritation, sore throat and SOB.  He has been taking Atrovent nasal spray bid.  Chronic year round allergies.   PCP-Dr. Marijo File.   Patient Active Problem List   Diagnosis Date Noted  . Bilateral lower extremity edema 06/02/2015  . UTI (lower urinary tract infection) 05/25/2015  . Sepsis ([place]) 05/25/2015  . Fever 05/25/2015  . Cough 05/25/2015  . Kidney stone 05/25/2015  . Hypokalemia 05/25/2015  . Acute urinary retention   . H/O cardiac catheterizationn 12/2009 with normal coronary arteries 05/30/2014  . Low back pain 12/17/2013  . Chest pain 09/10/2013  . Urinary incontinence 06/04/2011  . Long term current use of anticoagulant 01/12/2011  . DM type 2 (diabetes mellitus, type 2) ([place]) 07/31/2010  . HEMORRHOIDS-INTERNAL 06/27/2010  . Sleep apnea 06/27/2010  . DIVERTICULOSIS OF COLON 01/03/2010  . GERD 11/22/2009  . IRRITABLE BOWEL SYNDROME 11/22/2009  . ERECTILE DYSFUNCTION 06/06/2009  . BPH (benign prostatic  hyperplasia) 06/18/2008  . PSA, INCREASED 12/19/2007  . OBSTRUCTIVE SLEEP APNEA 07/24/2007  . DISSECTION, CAROTID ARTERY 07/24/2007  . TESTOSTERONE DEFICIENCY 07/23/2007  . HLD (hyperlipidemia) 07/23/2007  . COLONIC POLYPS, HX OF 07/23/2007  . Atrial fibrillation, permanent ([place]) 07/14/2007   Past Medical History:  Diagnosis Date  . Allergy    rhinitis  . Atrial fibrillation [facility]) Jan 2007   echo 1-07 normal ejection fraction, no significant valvular disease. adenosine cardiolite 1-07  no evidence of ischemia. A 48 hour Holter monitor in 7-07 showed good rate controlw/ chronic atrial fibrillation. Cath 1/11 normal cors. EF 45%. Echo 2-11 60-65%  . Benign prostatic hypertrophy   . Cerebral aneurysm    followed by Dr Estanislado Pandy  . Fatigue   . History of chest pain    a. s/p LHC 05/2014 with normal cors  . Hx of colonic polyps    diverticulosis  . Hyperlipidemia   . IBS (irritable bowel syndrome)   . Incontinence    Per pt 12/14/11  . Kidney stones   . Obesity   . Obstructive sleep apnea    noncompliant with CPAP  . Retinal vein occlusion    Past Surgical History:  Procedure Laterality Date  . CARDIAC CATHETERIZATION  1999  . CHOLECYSTECTOMY    . HERNIA REPAIR    . inguinal and umbilical herniorrhaphy    . LEFT HEART CATHETERIZATION WITH CORONARY ANGIOGRAM N/A 06/01/2014   Procedure: LEFT HEART CATHETERIZATION WITH CORONARY ANGIOGRAM;  Surgeon: Peter M Martinique, MD;  Location: [facility]  CATH LAB;  Service: Cardiovascular;  Laterality: N/A;  . LITHOTRIPSY    . TONSILLECTOMY AND ADENOIDECTOMY  as a child   Allergies  Allergen Reactions  . Clarithromycin     REACTION: Headache  . Epinephrine     REACTION: Increased Heart Rate  . Nimodipine     REACTION: Flushing   Prior to Admission medications   Medication Sig Start Date End Date Taking? Authorizing Provider  acetaminophen (TYLENOL) 650 MG CR tablet Take 1,300 mg by mouth every 8 (eight) hours as needed for pain.   Yes Historical  Provider, MD  apixaban (ELIQUIS) 2.5 MG TABS tablet Take 1 tablet (2.5 mg total) by mouth 2 (two) times daily. 06/10/15  Yes Jolaine Artist, MD  cetirizine (ZYRTEC) 10 MG tablet Take 10 mg by mouth daily.   Yes Historical Provider, MD  ipratropium (ATROVENT) 0.03 % nasal spray Place 1 spray into both nostrils 2 (two) times daily.   Yes Historical Provider, MD  OXYBUTYNIN CHLORIDE PO Take by mouth.   Yes Historical Provider, MD  loratadine (CLARITIN) 10 MG tablet Take 10 mg by mouth daily.    Historical Provider, MD  potassium chloride SA (K-DUR,KLOR-CON) 20 MEQ tablet Take 1 tablet (20 mEq total) by mouth daily as needed (take as needed when you take lasix). Patient not taking: Reported on 07/21/2016 06/02/15   Jolaine Artist, MD   Social History   Social History  . Marital status: Married    Spouse name: Maurice Wood  . Number of children: 2  . Years of education: college   Occupational History  .  Retired    retired   Social History Main Topics  . Smoking status: Former Smoker    Types: Cigarettes  . Smokeless tobacco: Never Used     Comment: quit 1973  . Alcohol use 0.6 oz/week    1 Cans of beer per week     Comment: Twice a month  . Drug use: No  . Sexual activity: Not on file   Other Topics Concern  . Not on file   Social History Narrative   Patient lives at home with his wife Maurice Wood)   Retired   Scientist, physiological- college   Right handed.   Caffeine-  Two cups coffee daily.   Review of Systems  Constitutional: Negative for chills and fever.  HENT: Positive for congestion, ear pain, rhinorrhea, sinus pressure and sneezing. Negative for ear discharge, sore throat and trouble swallowing.   Eyes: Negative for discharge and itching.  Respiratory: Positive for cough. Negative for shortness of breath.   Neurological: Positive for headaches.       Objective:   Physical Exam  Constitutional: He is oriented to person, place, and time. He appears well-developed and  well-nourished. No distress.  HENT:  Head: Normocephalic and atraumatic.  Right Ear: Tympanic membrane, external ear and ear canal normal.  Left Ear: Tympanic membrane, external ear and ear canal normal.  Nose: Right sinus exhibits frontal sinus tenderness. Right sinus exhibits no maxillary sinus tenderness. Left sinus exhibits frontal sinus tenderness. Left sinus exhibits no maxillary sinus tenderness.  Mouth/Throat: Oropharynx is clear and moist. No oropharyngeal exudate.  Eyes: Conjunctivae and EOM are normal. Pupils are equal, round, and reactive to light.  Neck: Normal range of motion. Neck supple. Carotid bruit is not present. No thyromegaly present.  Cardiovascular: Normal rate, regular rhythm and intact distal pulses.  Exam reveals no gallop and no friction rub.   Murmur heard.  Systolic murmur is present with a grade of 2/6  Pulmonary/Chest: Effort normal. He has no wheezes. He has rales (bilateral bases ).  Musculoskeletal: He exhibits edema ( trace, pitting).  Lymphadenopathy:    He has no cervical adenopathy.  Neurological: He is alert and oriented to person, place, and time. No cranial nerve deficit.  Skin: Skin is warm and dry. No rash noted. He is not diaphoretic.  Psychiatric: He has a normal mood and affect. His behavior is normal.  Nursing note and vitals reviewed.  Vitals:   07/21/16 0816  BP: 110/68  Pulse: 84  Resp: 16  Temp: 98.1 F (36.7 C)  SpO2: 94%  Weight: 190 lb (86.2 kg)  Height: 5\' 9"  (1.753 m)    Assessment & Plan:   1. Acute recurrent frontal sinusitis   2. Seasonal allergic rhinitis due to pollen    -New. -continue Zyrtec and Atrovent nasal spray. -rx for Amoxicillin and Flonase provided. -if no improvement in 72 hours, call for three day course of Prednisone.   Meds ordered this encounter  Medications  . OXYBUTYNIN CHLORIDE PO    Sig: Take by mouth.  Marland Kitchen amoxicillin (AMOXIL) 500 MG capsule    Sig: Take 2 capsules (1,000 mg total) by  mouth 2 (two) times daily.    Dispense:  40 capsule    Refill:  0  . fluticasone (FLONASE) 50 MCG/ACT nasal spray    Sig: Place 2 sprays into both nostrils daily.    Dispense:  16 g    Refill:  12    I personally performed the services described in this documentation, which was scribed in my presence. The recorded information has been reviewed and considered.  Rhyli Depaula Elayne Guerin, M.D. [facility] [address] [place], [place]  [id number] [phone] phone [phone] fax

## 2016-07-21 NOTE — Patient Instructions (Addendum)
1. Call on Tuesday if you are not feeling some better. 2. Continue Zyrtec and Ipratropium nasal spray. 3.  ADD Flonase nasal spray once daily. 4. Take Amoxicillin as prescribed.    IF you received an x-ray today, you will receive an invoice from [facility]. Please contact [facility] at [phone] with questions or concerns regarding your invoice.   IF you received labwork today, you will receive an invoice from [employer]. Please contact [facility] at [phone] with questions or concerns regarding your invoice.   Our billing staff will not be able to assist you with questions regarding bills from these companies.  You will be contacted with the lab results as soon as they are available. The fastest way to get your results is to activate your My Chart account. Instructions are located on the last page of this paperwork. If you have not heard from [place] regarding the results in 2 weeks, please contact this office.      Sinusitis, Adult Sinusitis is redness, soreness, and inflammation of the paranasal sinuses. Paranasal sinuses are air pockets within the bones of your face. They are located beneath your eyes, in the middle of your forehead, and above your eyes. In healthy paranasal sinuses, mucus is able to drain out, and air is able to circulate through them by way of your nose. However, when your paranasal sinuses are inflamed, mucus and air can become trapped. This can allow bacteria and other germs to grow and cause infection. Sinusitis can develop quickly and last only a short time (acute) or continue over a long period (chronic). Sinusitis that lasts for more than 12 weeks is considered chronic. CAUSES Causes of sinusitis include:  Allergies.  Structural abnormalities, such as displacement of the cartilage that separates your nostrils (deviated septum), which can decrease the air flow through your nose and sinuses and affect sinus  drainage.  Functional abnormalities, such as when the small hairs (cilia) that line your sinuses and help remove mucus do not work properly or are not present. SIGNS AND SYMPTOMS Symptoms of acute and chronic sinusitis are the same. The primary symptoms are pain and pressure around the affected sinuses. Other symptoms include:  Upper toothache.  Earache.  Headache.  Bad breath.  Decreased sense of smell and taste.  A cough, which worsens when you are lying flat.  Fatigue.  Fever.  Thick drainage from your nose, which often is green and may contain pus (purulent).  Swelling and warmth over the affected sinuses. DIAGNOSIS Your health care provider will perform a physical exam. During your exam, your health care provider may perform any of the following to help determine if you have acute sinusitis or chronic sinusitis:  Look in your nose for signs of abnormal growths in your nostrils (nasal polyps).  Tap over the affected sinus to check for signs of infection.  View the inside of your sinuses using an imaging device that has a light attached (endoscope). If your health care provider suspects that you have chronic sinusitis, one or more of the following tests may be recommended:  Allergy tests.  Nasal culture. A sample of mucus is taken from your nose, sent to a lab, and screened for bacteria.  Nasal cytology. A sample of mucus is taken from your nose and examined by your health care provider to determine if your sinusitis is related to an allergy. TREATMENT Most cases of acute sinusitis are related to a viral infection and will resolve on their own within  10 days. Sometimes, medicines are prescribed to help relieve symptoms of both acute and chronic sinusitis. These may include pain medicines, decongestants, nasal steroid sprays, or saline sprays. However, for sinusitis related to a bacterial infection, your health care provider will prescribe antibiotic medicines. These are  medicines that will help kill the bacteria causing the infection. Rarely, sinusitis is caused by a fungal infection. In these cases, your health care provider will prescribe antifungal medicine. For some cases of chronic sinusitis, surgery is needed. Generally, these are cases in which sinusitis recurs more than 3 times per year, despite other treatments. HOME CARE INSTRUCTIONS  Drink plenty of water. Water helps thin the mucus so your sinuses can drain more easily.  Use a humidifier.  Inhale steam 3-4 times a day (for example, sit in the bathroom with the shower running).  Apply a warm, moist washcloth to your face 3-4 times a day, or as directed by your health care provider.  Use saline nasal sprays to help moisten and clean your sinuses.  Take medicines only as directed by your health care provider.  If you were prescribed either an antibiotic or antifungal medicine, finish it all even if you start to feel better. SEEK IMMEDIATE MEDICAL CARE IF:  You have increasing pain or severe headaches.  You have nausea, vomiting, or drowsiness.  You have swelling around your face.  You have vision problems.  You have a stiff neck.  You have difficulty breathing.   This information is not intended to replace advice given to you by your health care provider. Make sure you discuss any questions you have with your health care provider.   Document Released: 11/26/2005 Document Revised: 12/17/2014 Document Reviewed: 12/11/2011 Elsevier Interactive Patient Education [employer].

## 2016-09-11 ENCOUNTER — Emergency Department (HOSPITAL_COMMUNITY): Payer: PPO

## 2016-09-11 ENCOUNTER — Encounter (HOSPITAL_COMMUNITY): Payer: Self-pay | Admitting: Emergency Medicine

## 2016-09-11 ENCOUNTER — Emergency Department (HOSPITAL_COMMUNITY)
Admission: EM | Admit: 2016-09-11 | Discharge: 2016-09-11 | Disposition: A | Discharge status: Home / Self Care | Payer: PPO | Attending: Emergency Medicine | Admitting: Emergency Medicine

## 2016-09-11 DIAGNOSIS — S0990XA Unspecified injury of head, initial encounter: Secondary | ICD-10-CM | POA: Diagnosis not present

## 2016-09-11 DIAGNOSIS — S80212A Abrasion, left knee, initial encounter: Secondary | ICD-10-CM | POA: Insufficient documentation

## 2016-09-11 DIAGNOSIS — Z23 Encounter for immunization: Secondary | ICD-10-CM | POA: Insufficient documentation

## 2016-09-11 DIAGNOSIS — Y929 Unspecified place or not applicable: Secondary | ICD-10-CM | POA: Insufficient documentation

## 2016-09-11 DIAGNOSIS — Y939 Activity, unspecified: Secondary | ICD-10-CM | POA: Diagnosis not present

## 2016-09-11 DIAGNOSIS — W19XXXA Unspecified fall, initial encounter: Secondary | ICD-10-CM

## 2016-09-11 DIAGNOSIS — Y999 Unspecified external cause status: Secondary | ICD-10-CM | POA: Insufficient documentation

## 2016-09-11 DIAGNOSIS — W0110XA Fall on same level from slipping, tripping and stumbling with subsequent striking against unspecified object, initial encounter: Secondary | ICD-10-CM | POA: Diagnosis not present

## 2016-09-11 DIAGNOSIS — Z955 Presence of coronary angioplasty implant and graft: Secondary | ICD-10-CM | POA: Insufficient documentation

## 2016-09-11 DIAGNOSIS — S060X9A Concussion with loss of consciousness of unspecified duration, initial encounter: Secondary | ICD-10-CM | POA: Diagnosis not present

## 2016-09-11 DIAGNOSIS — S80211A Abrasion, right knee, initial encounter: Secondary | ICD-10-CM | POA: Diagnosis not present

## 2016-09-11 DIAGNOSIS — Z7901 Long term (current) use of anticoagulants: Secondary | ICD-10-CM | POA: Insufficient documentation

## 2016-09-11 DIAGNOSIS — Z87891 Personal history of nicotine dependence: Secondary | ICD-10-CM | POA: Insufficient documentation

## 2016-09-11 DIAGNOSIS — E119 Type 2 diabetes mellitus without complications: Secondary | ICD-10-CM | POA: Diagnosis not present

## 2016-09-11 DIAGNOSIS — S0081XA Abrasion of other part of head, initial encounter: Secondary | ICD-10-CM | POA: Diagnosis not present

## 2016-09-11 DIAGNOSIS — S0003XA Contusion of scalp, initial encounter: Secondary | ICD-10-CM | POA: Diagnosis not present

## 2016-09-11 DIAGNOSIS — S0083XA Contusion of other part of head, initial encounter: Secondary | ICD-10-CM | POA: Insufficient documentation

## 2016-09-11 LAB — BASIC METABOLIC PANEL
ANION GAP: 5 (ref 5–15)
BUN: 15 mg/dL (ref 6–20)
CALCIUM: 8.8 mg/dL — AB (ref 8.9–10.3)
CO2: 24 mmol/L (ref 22–32)
Chloride: 109 mmol/L (ref 101–111)
Creatinine, Ser: 1.18 mg/dL (ref 0.61–1.24)
GFR calc non Af Amer: 56 mL/min — ABNORMAL LOW (ref >60)
Glucose, Bld: 147 mg/dL — ABNORMAL HIGH (ref 65–99)
Potassium: 3.7 mmol/L (ref 3.5–5.1)
SODIUM: 138 mmol/L (ref 135–145)

## 2016-09-11 LAB — CBC WITH DIFFERENTIAL/PLATELET
BASOS ABS: 0 10*3/uL (ref 0.0–0.1)
BASOS PCT: 0 %
EOS ABS: 0.2 10*3/uL (ref 0.0–0.7)
Eosinophils Relative: 3 %
HCT: 42.4 % (ref 39.0–52.0)
HEMOGLOBIN: 14.2 g/dL (ref 13.0–17.0)
Lymphocytes Relative: 29 %
Lymphs Abs: 1.5 10*3/uL (ref 0.7–4.0)
MCH: 30 pg (ref 26.0–34.0)
MCHC: 33.5 g/dL (ref 30.0–36.0)
MCV: 89.6 fL (ref 78.0–100.0)
Monocytes Absolute: 0.3 10*3/uL (ref 0.1–1.0)
Monocytes Relative: 5 %
NEUTROS PCT: 63 %
Neutro Abs: 3.3 10*3/uL (ref 1.7–7.7)
Platelets: 146 10*3/uL — ABNORMAL LOW (ref 150–400)
RBC: 4.73 MIL/uL (ref 4.22–5.81)
RDW: 13.6 % (ref 11.5–15.5)
WBC: 5.3 10*3/uL (ref 4.0–10.5)

## 2016-09-11 MED ORDER — BACITRACIN ZINC 500 UNIT/GM EX OINT: TOPICAL_OINTMENT | Freq: Two times a day (BID) | CUTANEOUS | Status: DC

## 2016-09-11 MED ORDER — TETANUS-DIPHTH-ACELL PERTUSSIS 5-2.5-18.5 LF-MCG/0.5 IM SUSP
0.5000 mL | Freq: Once | INTRAMUSCULAR | Status: AC
  Administered 2016-09-11: 0.5 mL via INTRAMUSCULAR
  Filled 2016-09-11: qty 0.5

## 2016-09-11 NOTE — ED Triage Notes (Addendum)
Pt fell 20 minutes ago, immediately new onset felt blurred vision throughout vision. No other symptoms. Takes Eliquis for a-fib.

## 2016-09-11 NOTE — ED Provider Notes (Addendum)
[place] DEPT Provider Note   CSN: [id number] Arrival date & time: 09/11/16  1522     History   Chief Complaint Chief Complaint  Patient presents with  . Head Injury    HPI Maurice Wood is a 82 y.o. male.Patient tripped and fell 45 minutes prior to coming here striking his head and bilateral knees. Low loss of consciousness. He complains of frontal headache and bilateral knee pain since the event. He admits to slightly increased blurred vision since event, though his glasses broke and he has poor vision at baseline secondary prior central retinal vein occlusion. Patient hasn't been ambulatory since the fall today. Nothing makes symptoms better or worse. No other associated symptoms  HPI  Past Medical History:  Diagnosis Date  . Allergy    rhinitis  . Atrial fibrillation [facility]) Jan 2007   echo 1-07 normal ejection fraction, no significant valvular disease. adenosine cardiolite 1-07  no evidence of ischemia. A 48 hour Holter monitor in 7-07 showed good rate controlw/ chronic atrial fibrillation. Cath 1/11 normal cors. EF 45%. Echo 2-11 60-65%  . Benign prostatic hypertrophy   . Cerebral aneurysm    followed by Dr Estanislado Pandy  . Fatigue   . History of chest pain    a. s/p LHC 05/2014 with normal cors  . Hx of colonic polyps    diverticulosis  . Hyperlipidemia   . IBS (irritable bowel syndrome)   . Incontinence    Per pt 12/14/11  . Kidney stones   . Obesity   . Obstructive sleep apnea    noncompliant with CPAP  . Retinal vein occlusion     Patient Active Problem List   Diagnosis Date Noted  . Bilateral lower extremity edema 06/02/2015  . UTI (lower urinary tract infection) 05/25/2015  . Sepsis ([place]) 05/25/2015  . Fever 05/25/2015  . Cough 05/25/2015  . Kidney stone 05/25/2015  . Hypokalemia 05/25/2015  . Acute urinary retention   . H/O cardiac catheterizationn 12/2009 with normal coronary arteries 05/30/2014  . Low back pain 12/17/2013  . Chest pain 09/10/2013  .  Urinary incontinence 06/04/2011  . Long term current use of anticoagulant 01/12/2011  . DM type 2 (diabetes mellitus, type 2) ([place]) 07/31/2010  . HEMORRHOIDS-INTERNAL 06/27/2010  . Sleep apnea 06/27/2010  . DIVERTICULOSIS OF COLON 01/03/2010  . GERD 11/22/2009  . IRRITABLE BOWEL SYNDROME 11/22/2009  . ERECTILE DYSFUNCTION 06/06/2009  . BPH (benign prostatic hyperplasia) 06/18/2008  . PSA, INCREASED 12/19/2007  . OBSTRUCTIVE SLEEP APNEA 07/24/2007  . DISSECTION, CAROTID ARTERY 07/24/2007  . TESTOSTERONE DEFICIENCY 07/23/2007  . HLD (hyperlipidemia) 07/23/2007  . COLONIC POLYPS, HX OF 07/23/2007  . Atrial fibrillation, permanent ([place]) 07/14/2007    Past Surgical History:  Procedure Laterality Date  . CARDIAC CATHETERIZATION  1999  . CHOLECYSTECTOMY    . HERNIA REPAIR    . inguinal and umbilical herniorrhaphy    . LEFT HEART CATHETERIZATION WITH CORONARY ANGIOGRAM N/A 06/01/2014   Procedure: LEFT HEART CATHETERIZATION WITH CORONARY ANGIOGRAM;  Surgeon: Peter M Martinique, MD;  Location: [facility] CATH LAB;  Service: Cardiovascular;  Laterality: N/A;  . LITHOTRIPSY    . TONSILLECTOMY AND ADENOIDECTOMY  as a child       Home Medications    Prior to Admission medications   Medication Sig Start Date End Date Taking? Authorizing Provider  acetaminophen (TYLENOL) 650 MG CR tablet Take 1,300 mg by mouth every 8 (eight) hours as needed for pain.    Historical Provider, MD  amoxicillin (AMOXIL)  500 MG capsule Take 2 capsules (1,000 mg total) by mouth 2 (two) times daily. 07/21/16   Wardell Honour, MD  apixaban (ELIQUIS) 2.5 MG TABS tablet Take 1 tablet (2.5 mg total) by mouth 2 (two) times daily. 06/10/15   Jolaine Artist, MD  cetirizine (ZYRTEC) 10 MG tablet Take 10 mg by mouth daily.    Historical Provider, MD  fluticasone (FLONASE) 50 MCG/ACT nasal spray Place 2 sprays into both nostrils daily. 07/21/16   Wardell Honour, MD  ipratropium (ATROVENT) 0.03 % nasal spray Place 1 spray into both  nostrils 2 (two) times daily.    Historical Provider, MD  loratadine (CLARITIN) 10 MG tablet Take 10 mg by mouth daily.    Historical Provider, MD  OXYBUTYNIN CHLORIDE PO Take by mouth.    Historical Provider, MD  potassium chloride SA (K-DUR,KLOR-CON) 20 MEQ tablet Take 1 tablet (20 mEq total) by mouth daily as needed (take as needed when you take lasix). Patient not taking: Reported on 07/21/2016 06/02/15   Jolaine Artist, MD    Family History Family History  Problem Relation Age of Onset  . Cancer Father     lung  . Alcohol abuse Father   . Aneurysm Father     Aortic aneurysm  . Diabetes Son   . Cancer Maternal Grandmother     colon  . Diabetes Maternal Uncle   . Diabetes Paternal Uncle   . Sudden death Mother   . Heart disease Paternal Grandfather     Social History Social History  Substance Use Topics  . Smoking status: Former Smoker    Types: Cigarettes  . Smokeless tobacco: Never Used     Comment: quit 1973  . Alcohol use 0.6 oz/week    1 Cans of beer per week     Comment: Twice a month     Allergies   Clarithromycin; Epinephrine; and Nimodipine   Review of Systems Review of Systems  Eyes: Positive for visual disturbance.       Slightly increased blurred vision today. Loss of peripheral vision right eye, chronic  Musculoskeletal:       Abrasions to head and bilateral knees.  Skin: Positive for wound.  Allergic/Immunologic:       Uncertain of last tetanus immunization  Neurological: Positive for headaches.  Hematological: Bruises/bleeds easily.  All other systems reviewed and are negative.    Physical Exam Updated Vital Signs BP 139/85 (BP Location: Right Arm)   Pulse 88   Temp 97.4 F (36.3 C) (Oral)   Resp 16   SpO2 94%   Physical Exam  Constitutional: He is oriented to person, place, and time. He appears well-developed and well-nourished. No distress.  Alert Glasgow Coma Score 15  HENT:  Head: Normocephalic and atraumatic.  Right Ear:  External ear normal.  Left Ear: External ear normal.  5 cm diameter Abrasion to right forehead, hematoma over right parietal area. Bilateral tympanic membranes normal  Eyes: Conjunctivae are normal. Pupils are equal, round, and reactive to light.  Neck: Neck supple. No JVD present. No tracheal deviation present. No thyromegaly present.  No bruit no tenderness along cervical spine  Cardiovascular: Normal rate.   No murmur heard. Irregularly irregular  Pulmonary/Chest: Effort normal and breath sounds normal. He exhibits no tenderness.  Abdominal: Soft. Bowel sounds are normal. He exhibits no distension. There is no tenderness.  No contusion abrasion or tenderness  Genitourinary: Penis normal.  Musculoskeletal: Normal range of motion. He exhibits no edema  or tenderness.  Entire spine nontender. Pelvis stable nontender. All 4 extremities without deformity or swelling mild tenderness at anterior knees bilaterally.  Neurological: He is alert and oriented to person, place, and time. Coordination normal.  Skin: Skin is warm and dry. No rash noted.  Abrasions over right forehead and bilateral anterior knees.  Psychiatric: He has a normal mood and affect. His behavior is normal.  Nursing note and vitals reviewed.    ED Treatments / Results  Labs (all labs ordered are listed, but only abnormal results are displayed) Labs Reviewed - No data to display  EKG  EKG Interpretation None       Radiology No results found.  Procedures Procedures (including critical care time)  Medications Ordered in ED Medications - No data to display Results for orders placed or performed during the hospital encounter of 09/11/16  CBC with Differential/Platelet  Result Value Ref Range   WBC 5.3 4.0 - 10.5 K/uL   RBC 4.73 4.22 - 5.81 MIL/uL   Hemoglobin 14.2 13.0 - 17.0 g/dL   HCT 42.4 39.0 - 52.0 %   MCV 89.6 78.0 - 100.0 fL   MCH 30.0 26.0 - 34.0 pg   MCHC 33.5 30.0 - 36.0 g/dL   RDW 13.6 11.5 -  15.5 %   Platelets 146 (L) 150 - 400 K/uL   Neutrophils Relative % 63 %   Neutro Abs 3.3 1.7 - 7.7 K/uL   Lymphocytes Relative 29 %   Lymphs Abs 1.5 0.7 - 4.0 K/uL   Monocytes Relative 5 %   Monocytes Absolute 0.3 0.1 - 1.0 K/uL   Eosinophils Relative 3 %   Eosinophils Absolute 0.2 0.0 - 0.7 K/uL   Basophils Relative 0 %   Basophils Absolute 0.0 0.0 - 0.1 K/uL  Basic metabolic panel  Result Value Ref Range   Sodium 138 135 - 145 mmol/L   Potassium 3.7 3.5 - 5.1 mmol/L   Chloride 109 101 - 111 mmol/L   CO2 24 22 - 32 mmol/L   Glucose, Bld 147 (H) 65 - 99 mg/dL   BUN 15 6 - 20 mg/dL   Creatinine, Ser 1.18 0.61 - 1.24 mg/dL   Calcium 8.8 (L) 8.9 - 10.3 mg/dL   GFR calc non Af Amer 56 (L) >60 mL/min   GFR calc Af Amer >60 >60 mL/min   Anion gap 5 5 - 15   Ct Head Wo Contrast  Result Date: 09/11/2016 CLINICAL DATA:  82 year old fell and struck head. Right parietal scalp hematoma. EXAM: CT HEAD WITHOUT CONTRAST CT CERVICAL SPINE WITHOUT CONTRAST TECHNIQUE: Multidetector CT imaging of the head and cervical spine was performed following the standard protocol without intravenous contrast. Multiplanar CT image reconstructions of the cervical spine were also generated. COMPARISON:  Brain MRI 03/24/2015 and head CT 07/13/2012 FINDINGS: CT HEAD FINDINGS Brain: No evidence for acute hemorrhage, mass lesion, midline shift, hydrocephalus or large infarct. Stable atrophy. Stable low-density in the white matter suggesting chronic changes. There is an old infarct in the left cerebellar hemisphere. Vascular: No hyperdense vessel or unexpected calcification. Skull: No fracture. Sinuses/Orbits: Sinus disease in the right sphenoid sinus and mild disease in the ethmoid air cells. Other: None CT CERVICAL SPINE FINDINGS Alignment: Straightening of the cervical spine with minimal kyphosis at C5. There is mild anterolisthesis at C4-C5 and mild retrolisthesis at C5-C6. Skull base and vertebrae: No acute fracture. No  primary bone lesion or focal pathologic process. Soft tissues and spinal canal: No prevertebral fluid or  swelling. No visible canal hematoma. Disc levels: Severe disc space narrowing at C5-C6 and C6-C7. Extensive bilateral facet arthropathy. Upper chest: Negative. Other: None IMPRESSION: No acute intracranial abnormality. No acute abnormality in cervical spine. Stable cerebral atrophy and evidence for chronic small vessel ischemic changes. Multilevel degenerative changes in the cervical spine, most prominent at C5 through C7. Electronically Signed   By: Markus Daft M.D.   On: 09/11/2016 17:26   Ct Cervical Spine Wo Contrast  Result Date: 09/11/2016 CLINICAL DATA:  82 year old fell and struck head. Right parietal scalp hematoma. EXAM: CT HEAD WITHOUT CONTRAST CT CERVICAL SPINE WITHOUT CONTRAST TECHNIQUE: Multidetector CT imaging of the head and cervical spine was performed following the standard protocol without intravenous contrast. Multiplanar CT image reconstructions of the cervical spine were also generated. COMPARISON:  Brain MRI 03/24/2015 and head CT 07/13/2012 FINDINGS: CT HEAD FINDINGS Brain: No evidence for acute hemorrhage, mass lesion, midline shift, hydrocephalus or large infarct. Stable atrophy. Stable low-density in the white matter suggesting chronic changes. There is an old infarct in the left cerebellar hemisphere. Vascular: No hyperdense vessel or unexpected calcification. Skull: No fracture. Sinuses/Orbits: Sinus disease in the right sphenoid sinus and mild disease in the ethmoid air cells. Other: None CT CERVICAL SPINE FINDINGS Alignment: Straightening of the cervical spine with minimal kyphosis at C5. There is mild anterolisthesis at C4-C5 and mild retrolisthesis at C5-C6. Skull base and vertebrae: No acute fracture. No primary bone lesion or focal pathologic process. Soft tissues and spinal canal: No prevertebral fluid or swelling. No visible canal hematoma. Disc levels: Severe disc space  narrowing at C5-C6 and C6-C7. Extensive bilateral facet arthropathy. Upper chest: Negative. Other: None IMPRESSION: No acute intracranial abnormality. No acute abnormality in cervical spine. Stable cerebral atrophy and evidence for chronic small vessel ischemic changes. Multilevel degenerative changes in the cervical spine, most prominent at C5 through C7. Electronically Signed   By: Markus Daft M.D.   On: 09/11/2016 17:26    Initial Impression / Assessment and Plan / ED Course  I have reviewed the triage vital signs and the nursing notes.  Pertinent labs & imaging results that were available during my care of the patient were reviewed by me and considered in my medical decision making (see chart for details).  Clinical Course    Declines pain medicine. 6 PM patient is alert and ambulates without difficulty. He states his vision is now normal for him Not lightheaded on standing. X-rays of knees not indicated. Discussed with patient who agrees. Plan Tylenol as needed for pain. Local wound care. Head injury instructions.Tdap updated local wound care to abrasions Final Clinical Impressions(s) / ED Diagnoses  Diagnosis #1 fall #2 mild concussion #3 abrasions to multiple sites Final diagnoses:  Fall    New Prescriptions New Prescriptions   No medications on file     Orlie Dakin, MD 09/11/16 Bab[place], MD 09/11/16 [id number]

## 2016-09-11 NOTE — Discharge Instructions (Signed)
Take Tylenol as directed for pain. Wash your abrasions daily with soap and water and place a thin layer of bacitracin ointment over the wounds and cover with a sterile bandage. Signs of infection include for pain, drainage from the wounds, redness around the wounds or fever. Return if you feel worse for any reason or contact your primary care physician.

## 2016-09-11 NOTE — ED Notes (Signed)
Bed: WA16 Expected date:  Expected time:  Means of arrival:  Comments: Res A 

## 2016-09-11 NOTE — ED Notes (Signed)
Patient states he stepped off of the curb wrong which caused him to fall

## 2016-09-12 DIAGNOSIS — H43813 Vitreous degeneration, bilateral: Secondary | ICD-10-CM | POA: Diagnosis not present

## 2016-09-12 DIAGNOSIS — H31002 Unspecified chorioretinal scars, left eye: Secondary | ICD-10-CM | POA: Diagnosis not present

## 2016-09-12 DIAGNOSIS — Z961 Presence of intraocular lens: Secondary | ICD-10-CM | POA: Diagnosis not present

## 2016-09-12 DIAGNOSIS — H5310 Unspecified subjective visual disturbances: Secondary | ICD-10-CM | POA: Diagnosis not present

## 2016-09-20 DIAGNOSIS — K219 Gastro-esophageal reflux disease without esophagitis: Secondary | ICD-10-CM | POA: Diagnosis not present

## 2016-09-20 DIAGNOSIS — K580 Irritable bowel syndrome with diarrhea: Secondary | ICD-10-CM | POA: Diagnosis not present

## 2016-10-22 DIAGNOSIS — N3289 Other specified disorders of bladder: Secondary | ICD-10-CM | POA: Diagnosis not present

## 2016-10-22 DIAGNOSIS — J069 Acute upper respiratory infection, unspecified: Secondary | ICD-10-CM | POA: Diagnosis not present

## 2016-10-22 DIAGNOSIS — R911 Solitary pulmonary nodule: Secondary | ICD-10-CM | POA: Diagnosis not present

## 2016-10-22 DIAGNOSIS — I482 Chronic atrial fibrillation: Secondary | ICD-10-CM | POA: Diagnosis not present

## 2016-10-22 DIAGNOSIS — N3281 Overactive bladder: Secondary | ICD-10-CM | POA: Diagnosis not present

## 2016-10-22 DIAGNOSIS — H349 Unspecified retinal vascular occlusion: Secondary | ICD-10-CM | POA: Diagnosis not present

## 2016-10-22 DIAGNOSIS — R197 Diarrhea, unspecified: Secondary | ICD-10-CM | POA: Diagnosis not present

## 2016-10-22 DIAGNOSIS — H9191 Unspecified hearing loss, right ear: Secondary | ICD-10-CM | POA: Diagnosis not present

## 2016-10-24 DIAGNOSIS — K219 Gastro-esophageal reflux disease without esophagitis: Secondary | ICD-10-CM | POA: Diagnosis not present

## 2016-10-24 DIAGNOSIS — K580 Irritable bowel syndrome with diarrhea: Secondary | ICD-10-CM | POA: Diagnosis not present

## 2016-11-06 DIAGNOSIS — H35372 Puckering of macula, left eye: Secondary | ICD-10-CM | POA: Diagnosis not present

## 2016-11-06 DIAGNOSIS — Z961 Presence of intraocular lens: Secondary | ICD-10-CM | POA: Diagnosis not present

## 2016-11-06 DIAGNOSIS — H348322 Tributary (branch) retinal vein occlusion, left eye, stable: Secondary | ICD-10-CM | POA: Diagnosis not present

## 2016-11-06 DIAGNOSIS — H35352 Cystoid macular degeneration, left eye: Secondary | ICD-10-CM | POA: Diagnosis not present

## 2016-11-07 ENCOUNTER — Ambulatory Visit (HOSPITAL_COMMUNITY)
Admission: RE | Admit: 2016-11-07 | Discharge: 2016-11-07 | Disposition: A | Discharge status: Home / Self Care | Payer: PPO | Source: Ambulatory Visit | Attending: Internal Medicine | Admitting: Internal Medicine

## 2016-11-07 ENCOUNTER — Encounter (HOSPITAL_COMMUNITY): Payer: Self-pay | Admitting: Internal Medicine

## 2016-11-07 VITALS — BP 126/77 | HR 79 | Wt 187.1 lb

## 2016-11-07 DIAGNOSIS — G4733 Obstructive sleep apnea (adult) (pediatric): Secondary | ICD-10-CM | POA: Insufficient documentation

## 2016-11-07 DIAGNOSIS — R5383 Other fatigue: Secondary | ICD-10-CM | POA: Insufficient documentation

## 2016-11-07 DIAGNOSIS — I340 Nonrheumatic mitral (valve) insufficiency: Secondary | ICD-10-CM | POA: Diagnosis not present

## 2016-11-07 DIAGNOSIS — Z79899 Other long term (current) drug therapy: Secondary | ICD-10-CM | POA: Insufficient documentation

## 2016-11-07 DIAGNOSIS — Z0001 Encounter for general adult medical examination with abnormal findings: Secondary | ICD-10-CM | POA: Insufficient documentation

## 2016-11-07 DIAGNOSIS — I482 Chronic atrial fibrillation: Secondary | ICD-10-CM

## 2016-11-07 DIAGNOSIS — I4821 Permanent atrial fibrillation: Secondary | ICD-10-CM

## 2016-11-07 DIAGNOSIS — I10 Essential (primary) hypertension: Secondary | ICD-10-CM | POA: Insufficient documentation

## 2016-11-07 MED ORDER — APIXABAN 5 MG PO TABS: 5.0000 mg | ORAL_TABLET | Freq: Two times a day (BID) | ORAL | 3 refills | Status: DC

## 2016-11-07 NOTE — Progress Notes (Signed)
CARDIOLOGY CLINIC NOTE  Patient ID: Maurice Wood, male   DOB: July 28, 1933, 82 y.o.   MRN: [id number]  HPI:  Maurice Wood is a delightful 82 year old male with a history of chronic atrial fibrillation, obstructive sleep apnea, with noncompliance with CPAP, hypertension, small intracranial aneurysms and history of spontaneous dissection of the right internal carotid artery in 2002.  Had episode of CP in January 2011 and underwent cath which showed normal coronaries with ef 45% (? artificially low due to AF). Echo  2/11: 60-65%  Echo 7/16  EF 60% Mod MR.   Evaluated at [facility] 09/06/13 with chest pain. CEs and ECG negative. (ECG with minimal nonspecific ST abnormality v4-v6)  Sharp pain on left side of his chest while at church. Lasted about 90 minutes and resolved. Received aspirin and nitroglycerin.   Here for yearly f/u. Doing well. Stays relatively active without CP or SOB. Does get very fatigues at times. Was diagnosed with OSA a long time ago and had CPAP but no longer wears. Wife denies that he snores or has apneic spells. Not using lasix. On Eliquis for AF.    ROS: All systems negative except as listed in HPI, PMH and Problem List.  Past Medical History:  Diagnosis Date  . Allergy    rhinitis  . Atrial fibrillation [facility]) Jan 2007   echo 1-07 normal ejection fraction, no significant valvular disease. adenosine cardiolite 1-07  no evidence of ischemia. A 48 hour Holter monitor in 7-07 showed good rate controlw/ chronic atrial fibrillation. Cath 1/11 normal cors. EF 45%. Echo 2-11 60-65%  . Benign prostatic hypertrophy   . Cerebral aneurysm    followed by Dr Estanislado Pandy  . Fatigue   . History of chest pain    a. s/p LHC 05/2014 with normal cors  . Hx of colonic polyps    diverticulosis  . Hyperlipidemia   . IBS (irritable bowel syndrome)   . Incontinence    Per pt 12/14/11  . Kidney stones   . Obesity   . Obstructive sleep apnea    noncompliant with CPAP  . Retinal vein occlusion      Current Outpatient Prescriptions  Medication Sig Dispense Refill  . acetaminophen (TYLENOL) 650 MG CR tablet Take 1,300 mg by mouth every 8 (eight) hours as needed for pain.    Marland Kitchen apixaban (ELIQUIS) 2.5 MG TABS tablet Take 1 tablet (2.5 mg total) by mouth 2 (two) times daily. 60 tablet 12  . cetirizine (ZYRTEC) 10 MG tablet Take 10 mg by mouth every morning.     . fluticasone (FLONASE) 50 MCG/ACT nasal spray Place 2 sprays into both nostrils daily. (Patient taking differently: Place 2 sprays into both nostrils every morning. ) 16 g 12  . ipratropium (ATROVENT) 0.03 % nasal spray Place 1 spray into both nostrils 2 (two) times daily.    Marland Kitchen omeprazole (PRILOSEC) 20 MG capsule Take 20 mg by mouth daily.    Marland Kitchen oxybutynin (DITROPAN-XL) 10 MG 24 hr tablet Take 10 mg by mouth every morning.    . potassium chloride SA (K-DUR,KLOR-CON) 20 MEQ tablet Take 1 tablet (20 mEq total) by mouth daily as needed (take as needed when you take lasix). 30 tablet 3   No current facility-administered medications for this encounter.      PHYSICAL EXAM: Vitals:   11/07/16 1343  BP: 126/77  Pulse: 79   Wt Readings from Last 3 Encounters:  11/07/16 187 lb 1.9 oz (84.9 kg)  07/21/16 190 lb (86.2 kg)  11/11/15 188 lb (85.3 kg)     General:  Well appearing. No resp difficulty HEENT: normal Neck: supple. JVP 6 Carotids 2+ bilaterally; no bruits. No lymphadenopathy or thryomegaly appreciated. Cor: PMI normal. Irregular rate & rhythm. No rubs, gallops or murmurs. Lungs: clear Abdomen: soft, nontender, nondistended. No hepatosplenomegaly. No bruits or masses. Good bowel sounds. Extremities: no cyanosis, clubbing, rash, no edema Neuro: alert & orientedx3, cranial nerves grossly intact. Moves all 4 extremities w/o difficulty. Affect pleasant.   Lab Results  Component Value Date   CHOL 166 02/24/2014   HDL 53.80 02/24/2014   LDLCALC 84 02/24/2014   LDLDIRECT 97.4 11/09/2011   TRIG 143.0 02/24/2014   CHOLHDL  3 02/24/2014    ASSESSMENT & PLAN:  1. A-fib -  Permanent. Rate controlled. -On eliquis 2.5 mg twice a day.Should increase to 5 bid.   2. Moderate MR - Will get repeat echo  3. Fatigue  - Has not been able to tolerate CPAP in past. Has seen Dr. Oneal Grout in past and he will contact him.   Glori Bickers MD 1:52 PM

## 2016-11-07 NOTE — Addendum Note (Signed)
Encounter addended by: Kennieth Rad, RN on: 11/07/2016  2:09 PM<BR>    Actions taken: Diagnosis association updated, Order list changed, Sign clinical note

## 2016-11-07 NOTE — Patient Instructions (Signed)
Increase Eliquis to 5mg  Two times Daily  Echo has been ordered for you.  Follow up in 1 year.

## 2016-11-08 DIAGNOSIS — L308 Other specified dermatitis: Secondary | ICD-10-CM | POA: Diagnosis not present

## 2016-11-08 DIAGNOSIS — L814 Other melanin hyperpigmentation: Secondary | ICD-10-CM | POA: Diagnosis not present

## 2016-11-08 DIAGNOSIS — D225 Melanocytic nevi of trunk: Secondary | ICD-10-CM | POA: Diagnosis not present

## 2016-11-08 DIAGNOSIS — D1801 Hemangioma of skin and subcutaneous tissue: Secondary | ICD-10-CM | POA: Diagnosis not present

## 2016-11-08 DIAGNOSIS — Z85828 Personal history of other malignant neoplasm of skin: Secondary | ICD-10-CM | POA: Diagnosis not present

## 2016-11-08 DIAGNOSIS — D3612 Benign neoplasm of peripheral nerves and autonomic nervous system, upper limb, including shoulder: Secondary | ICD-10-CM | POA: Diagnosis not present

## 2016-11-08 DIAGNOSIS — L720 Epidermal cyst: Secondary | ICD-10-CM | POA: Diagnosis not present

## 2016-11-08 DIAGNOSIS — D485 Neoplasm of uncertain behavior of skin: Secondary | ICD-10-CM | POA: Diagnosis not present

## 2016-11-08 DIAGNOSIS — L570 Actinic keratosis: Secondary | ICD-10-CM | POA: Diagnosis not present

## 2016-12-01 DIAGNOSIS — J0100 Acute maxillary sinusitis, unspecified: Secondary | ICD-10-CM | POA: Diagnosis not present

## 2016-12-01 DIAGNOSIS — R05 Cough: Secondary | ICD-10-CM | POA: Diagnosis not present

## 2016-12-04 ENCOUNTER — Ambulatory Visit (HOSPITAL_COMMUNITY): Payer: PPO | Attending: Cardiovascular Disease

## 2016-12-04 ENCOUNTER — Other Ambulatory Visit: Payer: Self-pay

## 2016-12-04 DIAGNOSIS — I10 Essential (primary) hypertension: Secondary | ICD-10-CM | POA: Insufficient documentation

## 2016-12-04 DIAGNOSIS — I4821 Permanent atrial fibrillation: Secondary | ICD-10-CM

## 2016-12-04 DIAGNOSIS — I482 Chronic atrial fibrillation: Secondary | ICD-10-CM | POA: Diagnosis not present

## 2016-12-04 DIAGNOSIS — I679 Cerebrovascular disease, unspecified: Secondary | ICD-10-CM | POA: Insufficient documentation

## 2016-12-04 DIAGNOSIS — E669 Obesity, unspecified: Secondary | ICD-10-CM | POA: Diagnosis not present

## 2016-12-14 ENCOUNTER — Telehealth (HOSPITAL_COMMUNITY): Payer: Self-pay | Admitting: *Deleted

## 2016-12-14 MED ORDER — APIXABAN 5 MG PO TABS: 5.0000 mg | ORAL_TABLET | Freq: Two times a day (BID) | ORAL | 3 refills | Status: DC

## 2016-12-14 NOTE — Telephone Encounter (Signed)
Pt given echo results 

## 2016-12-21 ENCOUNTER — Other Ambulatory Visit: Payer: Self-pay | Admitting: Family Medicine

## 2016-12-21 DIAGNOSIS — R911 Solitary pulmonary nodule: Secondary | ICD-10-CM

## 2016-12-26 ENCOUNTER — Other Ambulatory Visit: Payer: PPO

## 2017-01-09 DIAGNOSIS — Z87442 Personal history of urinary calculi: Secondary | ICD-10-CM | POA: Diagnosis not present

## 2017-01-09 DIAGNOSIS — N3941 Urge incontinence: Secondary | ICD-10-CM | POA: Diagnosis not present

## 2017-01-10 ENCOUNTER — Ambulatory Visit
Admission: RE | Admit: 2017-01-10 | Discharge: 2017-01-10 | Disposition: A | Discharge status: Home / Self Care | Payer: PPO | Source: Ambulatory Visit | Attending: Family Medicine | Admitting: Family Medicine

## 2017-01-10 DIAGNOSIS — R911 Solitary pulmonary nodule: Secondary | ICD-10-CM | POA: Diagnosis not present

## 2017-01-10 MED ORDER — IOPAMIDOL (ISOVUE-300) INJECTION 61%
75.0000 mL | Freq: Once | INTRAVENOUS | Status: AC | PRN
  Administered 2017-01-10: 75 mL via INTRAVENOUS

## 2017-01-23 DIAGNOSIS — K580 Irritable bowel syndrome with diarrhea: Secondary | ICD-10-CM | POA: Diagnosis not present

## 2017-01-23 DIAGNOSIS — K219 Gastro-esophageal reflux disease without esophagitis: Secondary | ICD-10-CM | POA: Diagnosis not present

## 2017-03-06 DIAGNOSIS — K580 Irritable bowel syndrome with diarrhea: Secondary | ICD-10-CM | POA: Diagnosis not present

## 2017-03-06 DIAGNOSIS — Z79899 Other long term (current) drug therapy: Secondary | ICD-10-CM | POA: Diagnosis not present

## 2017-03-14 DIAGNOSIS — R159 Full incontinence of feces: Secondary | ICD-10-CM | POA: Diagnosis not present

## 2017-03-14 DIAGNOSIS — N3941 Urge incontinence: Secondary | ICD-10-CM | POA: Diagnosis not present

## 2017-04-09 HISTORY — PX: INTERSTIM IMPLANT PLACEMENT: SHX5130

## 2017-04-12 DIAGNOSIS — Z79899 Other long term (current) drug therapy: Secondary | ICD-10-CM | POA: Diagnosis not present

## 2017-04-12 DIAGNOSIS — Z4549 Encounter for adjustment and management of other implanted nervous system device: Secondary | ICD-10-CM | POA: Diagnosis not present

## 2017-04-12 DIAGNOSIS — Z7901 Long term (current) use of anticoagulants: Secondary | ICD-10-CM | POA: Diagnosis not present

## 2017-04-12 DIAGNOSIS — Z7951 Long term (current) use of inhaled steroids: Secondary | ICD-10-CM | POA: Diagnosis not present

## 2017-04-12 DIAGNOSIS — I482 Chronic atrial fibrillation: Secondary | ICD-10-CM | POA: Diagnosis not present

## 2017-04-12 DIAGNOSIS — Z888 Allergy status to other drugs, medicaments and biological substances status: Secondary | ICD-10-CM | POA: Diagnosis not present

## 2017-04-12 DIAGNOSIS — K219 Gastro-esophageal reflux disease without esophagitis: Secondary | ICD-10-CM | POA: Diagnosis not present

## 2017-04-12 DIAGNOSIS — Z886 Allergy status to analgesic agent status: Secondary | ICD-10-CM | POA: Diagnosis not present

## 2017-04-12 DIAGNOSIS — Z881 Allergy status to other antibiotic agents status: Secondary | ICD-10-CM | POA: Diagnosis not present

## 2017-04-12 DIAGNOSIS — R350 Frequency of micturition: Secondary | ICD-10-CM | POA: Diagnosis not present

## 2017-04-12 DIAGNOSIS — R159 Full incontinence of feces: Secondary | ICD-10-CM | POA: Diagnosis not present

## 2017-04-12 DIAGNOSIS — H903 Sensorineural hearing loss, bilateral: Secondary | ICD-10-CM | POA: Diagnosis not present

## 2017-04-12 DIAGNOSIS — Z87891 Personal history of nicotine dependence: Secondary | ICD-10-CM | POA: Diagnosis not present

## 2017-04-12 DIAGNOSIS — N3941 Urge incontinence: Secondary | ICD-10-CM | POA: Diagnosis not present

## 2017-04-19 DIAGNOSIS — Z886 Allergy status to analgesic agent status: Secondary | ICD-10-CM | POA: Diagnosis not present

## 2017-04-19 DIAGNOSIS — Z87891 Personal history of nicotine dependence: Secondary | ICD-10-CM | POA: Diagnosis not present

## 2017-04-19 DIAGNOSIS — Z87442 Personal history of urinary calculi: Secondary | ICD-10-CM | POA: Diagnosis not present

## 2017-04-19 DIAGNOSIS — J309 Allergic rhinitis, unspecified: Secondary | ICD-10-CM | POA: Diagnosis not present

## 2017-04-19 DIAGNOSIS — G4733 Obstructive sleep apnea (adult) (pediatric): Secondary | ICD-10-CM | POA: Diagnosis not present

## 2017-04-19 DIAGNOSIS — N3941 Urge incontinence: Secondary | ICD-10-CM | POA: Diagnosis not present

## 2017-04-19 DIAGNOSIS — Z79899 Other long term (current) drug therapy: Secondary | ICD-10-CM | POA: Diagnosis not present

## 2017-04-19 DIAGNOSIS — Z7951 Long term (current) use of inhaled steroids: Secondary | ICD-10-CM | POA: Diagnosis not present

## 2017-04-19 DIAGNOSIS — Z888 Allergy status to other drugs, medicaments and biological substances status: Secondary | ICD-10-CM | POA: Diagnosis not present

## 2017-04-19 DIAGNOSIS — N320 Bladder-neck obstruction: Secondary | ICD-10-CM | POA: Diagnosis not present

## 2017-04-19 DIAGNOSIS — Z881 Allergy status to other antibiotic agents status: Secondary | ICD-10-CM | POA: Diagnosis not present

## 2017-04-19 DIAGNOSIS — I4891 Unspecified atrial fibrillation: Secondary | ICD-10-CM | POA: Diagnosis not present

## 2017-04-19 DIAGNOSIS — R350 Frequency of micturition: Secondary | ICD-10-CM | POA: Diagnosis not present

## 2017-04-19 DIAGNOSIS — H905 Unspecified sensorineural hearing loss: Secondary | ICD-10-CM | POA: Diagnosis not present

## 2017-04-19 DIAGNOSIS — E119 Type 2 diabetes mellitus without complications: Secondary | ICD-10-CM | POA: Diagnosis not present

## 2017-04-19 DIAGNOSIS — I499 Cardiac arrhythmia, unspecified: Secondary | ICD-10-CM | POA: Diagnosis not present

## 2017-04-19 DIAGNOSIS — Z7902 Long term (current) use of antithrombotics/antiplatelets: Secondary | ICD-10-CM | POA: Diagnosis not present

## 2017-04-19 DIAGNOSIS — Z9889 Other specified postprocedural states: Secondary | ICD-10-CM | POA: Diagnosis not present

## 2017-04-22 DIAGNOSIS — H9191 Unspecified hearing loss, right ear: Secondary | ICD-10-CM | POA: Diagnosis not present

## 2017-04-22 DIAGNOSIS — N3289 Other specified disorders of bladder: Secondary | ICD-10-CM | POA: Diagnosis not present

## 2017-04-22 DIAGNOSIS — N3281 Overactive bladder: Secondary | ICD-10-CM | POA: Diagnosis not present

## 2017-04-22 DIAGNOSIS — R197 Diarrhea, unspecified: Secondary | ICD-10-CM | POA: Diagnosis not present

## 2017-04-22 DIAGNOSIS — H349 Unspecified retinal vascular occlusion: Secondary | ICD-10-CM | POA: Diagnosis not present

## 2017-04-22 DIAGNOSIS — I482 Chronic atrial fibrillation: Secondary | ICD-10-CM | POA: Diagnosis not present

## 2017-04-22 DIAGNOSIS — G6289 Other specified polyneuropathies: Secondary | ICD-10-CM | POA: Diagnosis not present

## 2017-04-22 DIAGNOSIS — R454 Irritability and anger: Secondary | ICD-10-CM | POA: Diagnosis not present

## 2017-04-30 DIAGNOSIS — R159 Full incontinence of feces: Secondary | ICD-10-CM | POA: Diagnosis not present

## 2017-04-30 DIAGNOSIS — R350 Frequency of micturition: Secondary | ICD-10-CM | POA: Diagnosis not present

## 2017-04-30 DIAGNOSIS — N3941 Urge incontinence: Secondary | ICD-10-CM | POA: Diagnosis not present

## 2017-05-01 DIAGNOSIS — H52203 Unspecified astigmatism, bilateral: Secondary | ICD-10-CM | POA: Diagnosis not present

## 2017-05-01 DIAGNOSIS — Z961 Presence of intraocular lens: Secondary | ICD-10-CM | POA: Diagnosis not present

## 2017-06-09 DIAGNOSIS — H6991 Unspecified Eustachian tube disorder, right ear: Secondary | ICD-10-CM | POA: Diagnosis not present

## 2017-06-18 DIAGNOSIS — H903 Sensorineural hearing loss, bilateral: Secondary | ICD-10-CM | POA: Diagnosis not present

## 2017-06-25 DIAGNOSIS — Z85828 Personal history of other malignant neoplasm of skin: Secondary | ICD-10-CM | POA: Diagnosis not present

## 2017-06-25 DIAGNOSIS — L570 Actinic keratosis: Secondary | ICD-10-CM | POA: Diagnosis not present

## 2017-06-25 DIAGNOSIS — C4442 Squamous cell carcinoma of skin of scalp and neck: Secondary | ICD-10-CM | POA: Diagnosis not present

## 2017-07-17 DIAGNOSIS — Z86010 Personal history of colonic polyps: Secondary | ICD-10-CM | POA: Diagnosis not present

## 2017-07-17 DIAGNOSIS — K580 Irritable bowel syndrome with diarrhea: Secondary | ICD-10-CM | POA: Diagnosis not present

## 2017-07-29 DIAGNOSIS — N3941 Urge incontinence: Secondary | ICD-10-CM | POA: Diagnosis not present

## 2017-09-10 DIAGNOSIS — H34832 Tributary (branch) retinal vein occlusion, left eye, with macular edema: Secondary | ICD-10-CM | POA: Diagnosis not present

## 2017-09-10 DIAGNOSIS — H35352 Cystoid macular degeneration, left eye: Secondary | ICD-10-CM | POA: Diagnosis not present

## 2017-09-10 DIAGNOSIS — H35372 Puckering of macula, left eye: Secondary | ICD-10-CM | POA: Diagnosis not present

## 2017-09-10 DIAGNOSIS — Z961 Presence of intraocular lens: Secondary | ICD-10-CM | POA: Diagnosis not present

## 2017-09-23 DIAGNOSIS — N3941 Urge incontinence: Secondary | ICD-10-CM | POA: Diagnosis not present

## 2017-10-02 DIAGNOSIS — J069 Acute upper respiratory infection, unspecified: Secondary | ICD-10-CM | POA: Diagnosis not present

## 2017-10-03 ENCOUNTER — Emergency Department (HOSPITAL_COMMUNITY): Payer: PPO

## 2017-10-03 ENCOUNTER — Encounter (HOSPITAL_COMMUNITY): Payer: Self-pay

## 2017-10-03 ENCOUNTER — Emergency Department (HOSPITAL_COMMUNITY)
Admission: EM | Admit: 2017-10-03 | Discharge: 2017-10-03 | Disposition: A | Discharge status: Home / Self Care | Payer: PPO | Attending: Emergency Medicine | Admitting: Emergency Medicine

## 2017-10-03 DIAGNOSIS — E785 Hyperlipidemia, unspecified: Secondary | ICD-10-CM | POA: Insufficient documentation

## 2017-10-03 DIAGNOSIS — E119 Type 2 diabetes mellitus without complications: Secondary | ICD-10-CM | POA: Diagnosis not present

## 2017-10-03 DIAGNOSIS — I4891 Unspecified atrial fibrillation: Secondary | ICD-10-CM | POA: Diagnosis not present

## 2017-10-03 DIAGNOSIS — Z87891 Personal history of nicotine dependence: Secondary | ICD-10-CM | POA: Insufficient documentation

## 2017-10-03 DIAGNOSIS — R531 Weakness: Secondary | ICD-10-CM

## 2017-10-03 DIAGNOSIS — R079 Chest pain, unspecified: Secondary | ICD-10-CM | POA: Diagnosis not present

## 2017-10-03 DIAGNOSIS — E860 Dehydration: Secondary | ICD-10-CM | POA: Diagnosis not present

## 2017-10-03 DIAGNOSIS — Z79899 Other long term (current) drug therapy: Secondary | ICD-10-CM | POA: Diagnosis not present

## 2017-10-03 LAB — CBC
HCT: 44 % (ref 39.0–52.0)
HEMOGLOBIN: 15 g/dL (ref 13.0–17.0)
MCH: 31 pg (ref 26.0–34.0)
MCHC: 34.1 g/dL (ref 30.0–36.0)
MCV: 90.9 fL (ref 78.0–100.0)
Platelets: 144 10*3/uL — ABNORMAL LOW (ref 150–400)
RBC: 4.84 MIL/uL (ref 4.22–5.81)
RDW: 13.6 % (ref 11.5–15.5)
WBC: 10.1 10*3/uL (ref 4.0–10.5)

## 2017-10-03 LAB — BASIC METABOLIC PANEL
Anion gap: 11 (ref 5–15)
BUN: 13 mg/dL (ref 6–20)
CALCIUM: 8.9 mg/dL (ref 8.9–10.3)
CO2: 20 mmol/L — ABNORMAL LOW (ref 22–32)
Chloride: 104 mmol/L (ref 101–111)
Creatinine, Ser: 0.96 mg/dL (ref 0.61–1.24)
GFR calc Af Amer: >60 mL/min (ref >60)
GLUCOSE: 163 mg/dL — AB (ref 65–99)
POTASSIUM: 4.2 mmol/L (ref 3.5–5.1)
Sodium: 135 mmol/L (ref 135–145)

## 2017-10-03 LAB — URINALYSIS, ROUTINE W REFLEX MICROSCOPIC
Bacteria, UA: NONE SEEN
Bilirubin Urine: NEGATIVE
Glucose, UA: NEGATIVE mg/dL
Hgb urine dipstick: NEGATIVE
Ketones, ur: 5 mg/dL — AB
LEUKOCYTES UA: NEGATIVE
Nitrite: NEGATIVE
Protein, ur: 30 mg/dL — AB
Specific Gravity, Urine: 1.027 (ref 1.005–1.030)
pH: 5 (ref 5.0–8.0)

## 2017-10-03 LAB — POCT I-STAT TROPONIN I: TROPONIN I, POC: 0 ng/mL (ref 0.00–0.08)

## 2017-10-03 MED ORDER — SODIUM CHLORIDE 0.9 % IV BOLUS (SEPSIS)
1000.0000 mL | Freq: Once | INTRAVENOUS | Status: AC
  Administered 2017-10-03: 1000 mL via INTRAVENOUS

## 2017-10-03 NOTE — ED Notes (Signed)
Pt. Made aware for need of urine specimen. 

## 2017-10-03 NOTE — ED Triage Notes (Signed)
Patient c/o mid chest pain at 1400 yesterday. Patient went to UC. Patient was given cough syrup and a nose spray. Patient continues to c/o intermittent chest pain and having nausea/vomiting. Patient also c/o feeling weak.

## 2017-10-03 NOTE — ED Provider Notes (Signed)
[facility] DEPT Provider Note   CSN: [id number] Arrival date & time: 10/03/17  1710     History   Chief Complaint Chief Complaint  Patient presents with  . Nausea  . Weakness  . Chest Pain    HPI Maurice Wood is a 83 y.o. male.  HPI 83 year old male who presents with generalized weakness and fatigue. He has a history of chronic atrial fibrillation on Eliquis and obesity. States that yesterday he began to develop cough, mild sputum production, nasal congestion. He went to an urgent care, and they gave him supportive care treatments. Today he has had some decreased appetite, feeling weak and tired. His wife states that he has not wanted to move around or do many activities at all today. Still has a slightly productive cough. States that whenever he coughs, his anterior chest wall feels sore. Otherwise he denies any chest pain. No chest pain with activity or exertion. Denies shortness of breath, syncope or near syncope, lower extremity edema, fever. He was afraid that he may be starting to develop urosepsis as he recalls that he was very sick at one time several years ago due to a UTI. He has noted some mild dysuria and urinary frequency that comes and goes, but states this has been ongoing for years. Denies any severe abdominal pain or flank pain. Today had one episode of vomiting. Denies any diarrhea.   Past Medical History:  Diagnosis Date  . Allergy    rhinitis  . Atrial fibrillation [facility]) Jan 2007   echo 1-07 normal ejection fraction, no significant valvular disease. adenosine cardiolite 1-07  no evidence of ischemia. A 48 hour Holter monitor in 7-07 showed good rate controlw/ chronic atrial fibrillation. Cath 1/11 normal cors. EF 45%. Echo 2-11 60-65%  . Benign prostatic hypertrophy   . Cerebral aneurysm    followed by Dr Estanislado Pandy  . Fatigue   . History of chest pain    a. s/p LHC 05/2014 with normal cors  . Hx of colonic polyps    diverticulosis  . Hyperlipidemia   . IBS (irritable bowel syndrome)   . Incontinence    Per pt 12/14/11  . Kidney stones   . Obesity   . Obstructive sleep apnea    noncompliant with CPAP  . Retinal vein occlusion     Patient Active Problem List   Diagnosis Date Noted  . Bilateral lower extremity edema 06/02/2015  . UTI (lower urinary tract infection) 05/25/2015  . Sepsis ([place]) 05/25/2015  . Fever 05/25/2015  . Cough 05/25/2015  . Kidney stone 05/25/2015  . Hypokalemia 05/25/2015  . Acute urinary retention   . H/O cardiac catheterizationn 12/2009 with normal coronary arteries 05/30/2014  . Low back pain 12/17/2013  . Chest pain 09/10/2013  . Urinary incontinence 06/04/2011  . Long term current use of anticoagulant 01/12/2011  . DM type 2 (diabetes mellitus, type 2) ([place]) 07/31/2010  . HEMORRHOIDS-INTERNAL 06/27/2010  . Sleep apnea 06/27/2010  . DIVERTICULOSIS OF COLON 01/03/2010  . GERD 11/22/2009  . IRRITABLE BOWEL SYNDROME 11/22/2009  . ERECTILE DYSFUNCTION 06/06/2009  . BPH (benign prostatic hyperplasia) 06/18/2008  . PSA, INCREASED 12/19/2007  . OBSTRUCTIVE SLEEP APNEA 07/24/2007  . DISSECTION, CAROTID ARTERY 07/24/2007  . TESTOSTERONE DEFICIENCY 07/23/2007  . HLD (hyperlipidemia) 07/23/2007  . COLONIC POLYPS, HX OF 07/23/2007  . Atrial fibrillation, permanent ([place]) 07/14/2007    Past Surgical History:  Procedure Laterality Date  . CARDIAC CATHETERIZATION  1999  . CHOLECYSTECTOMY    .  HERNIA REPAIR    . inguinal and umbilical herniorrhaphy    . LEFT HEART CATHETERIZATION WITH CORONARY ANGIOGRAM N/A 06/01/2014   Procedure: LEFT HEART CATHETERIZATION WITH CORONARY ANGIOGRAM;  Surgeon: Peter M Martinique, MD;  Location[facility] CATH LAB;  Service: Cardiovascular;  Laterality: N/A;  . LITHOTRIPSY    . TONSILLECTOMY AND ADENOIDECTOMY  as a child       Home Medications    Prior to Admission medications   Medication Sig Start Date End Date Taking? Authorizing Provider    acetaminophen (TYLENOL) 650 MG CR tablet Take 1,300 mg by mouth every 8 (eight) hours as needed for pain.   Yes [provider]  apixaban (ELIQUIS) 5 MG TABS tablet Take 1 tablet (5 mg total) by mouth 2 (two) times daily. 12/14/16  Yes [name], Shaune Pascal, MD  cetirizine (ZYRTEC) 10 MG tablet Take 10 mg by mouth every morning.    Yes [provider]  HYDROcodone-homatropine (HYCODAN) 5-1.5 MG/5ML syrup Take 5 mLs by mouth every 6 (six) hours as needed for cough.   Yes [provider]  ipratropium (ATROVENT) 0.06 % nasal spray Place 1 spray into both nostrils 4 (four) times daily.  10/02/17  Yes [provider]  ranitidine (ZANTAC) 150 MG capsule Take 150 mg by mouth daily.   Yes [provider]  fluticasone (FLONASE) 50 MCG/ACT nasal spray Place 2 sprays into both nostrils daily. Patient not taking: Reported on 10/03/2017 07/21/16   Wardell Honour, MD  potassium chloride SA (K-DUR,KLOR-CON) 20 MEQ tablet Take 1 tablet (20 mEq total) by mouth daily as needed (take as needed when you take lasix). 06/02/15   [name], Shaune Pascal, MD    Family History Family History  Problem Relation Age of Onset  . Cancer Father        lung  . Alcohol abuse Father   . Aneurysm Father        Aortic aneurysm  . Cancer Maternal Grandmother        colon  . Sudden death Mother   . Heart disease Paternal Grandfather   . Diabetes Son   . Diabetes Maternal Uncle   . Diabetes Paternal Uncle     Social History Social History  Substance Use Topics  . Smoking status: Former Smoker    Types: Cigarettes  . Smokeless tobacco: Never Used     Comment: quit 1973  . Alcohol use 0.6 oz/week    1 Cans of beer per week     Comment: Twice a month     Allergies   Clarithromycin; Epinephrine; and Nimodipine   Review of Systems Review of Systems  Constitutional: Negative for fever.  Respiratory: Positive for cough. Negative for shortness of breath.   Cardiovascular:  Negative for leg swelling.  Gastrointestinal: Negative for abdominal pain.  All other systems reviewed and are negative.    Physical Exam Updated Vital Signs BP 102/75   Pulse 86   Temp 98.5 F (36.9 C) (Oral)   Resp 18   Ht 5\' 9"  (1.753 m)   Wt 88 kg (194 lb)   SpO2 92%   BMI 28.65 kg/m   Physical Exam Physical Exam  Nursing note and vitals reviewed. Constitutional: Well developed, well nourished, non-toxic, and in no acute distress Head: Normocephalic and atraumatic.  Mouth/Throat: Oropharynx is clear and moist.  Neck: Normal range of motion. Neck supple.  Cardiovascular: Normal rate and regular rhythm.   Pulmonary/Chest: Effort normal and breath sounds normal.  Abdominal: Soft.  There is no tenderness. There is no rebound and no guarding.  Musculoskeletal: Normal range of motion.  Neurological: Alert, no facial droop, fluent speech, moves all extremities symmetrically Skin: Skin is warm and dry.  Psychiatric: Cooperative   ED Treatments / Results  Labs (all labs ordered are listed, but only abnormal results are displayed) Labs Reviewed  BASIC METABOLIC PANEL - Abnormal; Notable for the following:       Result Value   CO2 20 (*)    Glucose, Bld 163 (*)    All other components within normal limits  CBC - Abnormal; Notable for the following:    Platelets 144 (*)    All other components within normal limits  URINALYSIS, ROUTINE W REFLEX MICROSCOPIC - Abnormal; Notable for the following:    Color, Urine AMBER (*)    Ketones, ur 5 (*)    Protein, ur 30 (*)    Squamous Epithelial / LPF 0-5 (*)    All other components within normal limits  URINE CULTURE  I-STAT TROPONIN, ED  POCT I-STAT TROPONIN I    EKG  EKG Interpretation  Date/Time:  Thursday October 03 2017 17:58:36 EDT Ventricular Rate:  96 PR Interval:    QRS Duration: 88 QT Interval:  345 QTC Calculation: 436 R Axis:   -29 Text Interpretation:  Atrial fibrillation RSR' in V1 or V2, probably normal  variant Inferior infarct, old chronic afib, similar to previous EKG  Confirmed by Brantley Stage [phone]) on 10/03/2017 9:02:53 PM       Radiology Dg Chest 2 View  Result Date: 10/03/2017 CLINICAL DATA:  Mid chest pain since yesterday.  Ex-smoker. EXAM: CHEST  2 VIEW COMPARISON:  05/25/2015 and chest CT dated 01/10/2017. Chest radiographs dated 05/30/2014. FINDINGS: Normal sized heart. Increased linear density at the medial left lung base. Clear right lung. Stable mild diffuse peribronchial thickening. Diffuse osteopenia. Left glenohumeral joint degenerative changes. Thoracic spine degenerative changes. IMPRESSION: 1. Increased linear atelectasis or scarring in the left lower lobe. 2. Stable mild chronic bronchitic changes. Electronically Signed   By: Claudie Revering M.D.   On: 10/03/2017 18:36    Procedures Procedures (including critical care time)  Medications Ordered in ED Medications  sodium chloride 0.9 % bolus 1,000 mL (0 mLs Intravenous Stopped 10/03/17 2219)     Initial Impression / Assessment and Plan / ED Course  I have reviewed the triage vital signs and the nursing notes.  Pertinent labs & imaging results that were available during my care of the patient were reviewed by me and considered in my medical decision making (see chart for details).    83 year old male who presents with generalized weakness, cough, congestion, and one episode of vomiting. He is afebrile, hemodynamically stable. He is well-appearing in no acute distress. Exam otherwise non-focal. Does look mildly dry and suspect some mild dehydration. Blood work reassuring.there no evidence of UTI on his urinalysis and his chest x-ray shows no obvious infiltrate or other acute cardiopulmonary processes. This may be viral illness. He feels significantly improved after IV fluids. He wishes to go home and continue supportive care. Strict return and follow-up instructions reviewed. He expressed understanding of all discharge  instructions and felt comfortable with the plan of care.   Final Clinical Impressions(s) / ED Diagnoses   Final diagnoses:  Generalized weakness  Dehydration    New Prescriptions New Prescriptions   No medications on file     Forde Dandy, MD 10/03/17 2306

## 2017-10-03 NOTE — Discharge Instructions (Signed)
Please call your PCP for close follow-up  Drink fluids and get rest.  Return without fail for worsening symptoms including fevers, confusion, difficulty walking, difficulty breathing, or any other symptoms concerning to you. He may be having an early viral illness.There is no clear evidence of pneumonia on your chest x-ray or urinary tract infection in the urine.

## 2017-10-05 LAB — URINE CULTURE: Culture: <10000 — AB

## 2017-10-24 DIAGNOSIS — R42 Dizziness and giddiness: Secondary | ICD-10-CM | POA: Diagnosis not present

## 2017-10-24 DIAGNOSIS — R51 Headache: Secondary | ICD-10-CM | POA: Diagnosis not present

## 2017-10-24 DIAGNOSIS — N3289 Other specified disorders of bladder: Secondary | ICD-10-CM | POA: Diagnosis not present

## 2017-10-24 DIAGNOSIS — R197 Diarrhea, unspecified: Secondary | ICD-10-CM | POA: Diagnosis not present

## 2017-10-24 DIAGNOSIS — H349 Unspecified retinal vascular occlusion: Secondary | ICD-10-CM | POA: Diagnosis not present

## 2017-10-24 DIAGNOSIS — G6289 Other specified polyneuropathies: Secondary | ICD-10-CM | POA: Diagnosis not present

## 2017-10-24 DIAGNOSIS — N3281 Overactive bladder: Secondary | ICD-10-CM | POA: Diagnosis not present

## 2017-10-24 DIAGNOSIS — H9191 Unspecified hearing loss, right ear: Secondary | ICD-10-CM | POA: Diagnosis not present

## 2017-10-24 DIAGNOSIS — Z1389 Encounter for screening for other disorder: Secondary | ICD-10-CM | POA: Diagnosis not present

## 2017-10-24 DIAGNOSIS — I482 Chronic atrial fibrillation: Secondary | ICD-10-CM | POA: Diagnosis not present

## 2017-10-29 ENCOUNTER — Encounter (HOSPITAL_COMMUNITY): Payer: Self-pay | Admitting: Internal Medicine

## 2017-10-29 ENCOUNTER — Ambulatory Visit (HOSPITAL_COMMUNITY)
Admission: RE | Admit: 2017-10-29 | Discharge: 2017-10-29 | Disposition: A | Discharge status: Home / Self Care | Payer: PPO | Source: Ambulatory Visit | Attending: Internal Medicine | Admitting: Internal Medicine

## 2017-10-29 VITALS — BP 118/80 | HR 73 | Wt 192.0 lb

## 2017-10-29 DIAGNOSIS — E785 Hyperlipidemia, unspecified: Secondary | ICD-10-CM | POA: Insufficient documentation

## 2017-10-29 DIAGNOSIS — I482 Chronic atrial fibrillation: Secondary | ICD-10-CM | POA: Diagnosis not present

## 2017-10-29 DIAGNOSIS — I340 Nonrheumatic mitral (valve) insufficiency: Secondary | ICD-10-CM

## 2017-10-29 DIAGNOSIS — N400 Enlarged prostate without lower urinary tract symptoms: Secondary | ICD-10-CM | POA: Diagnosis not present

## 2017-10-29 DIAGNOSIS — Z87442 Personal history of urinary calculi: Secondary | ICD-10-CM | POA: Diagnosis not present

## 2017-10-29 DIAGNOSIS — Z7901 Long term (current) use of anticoagulants: Secondary | ICD-10-CM | POA: Diagnosis not present

## 2017-10-29 DIAGNOSIS — G4733 Obstructive sleep apnea (adult) (pediatric): Secondary | ICD-10-CM | POA: Insufficient documentation

## 2017-10-29 DIAGNOSIS — I671 Cerebral aneurysm, nonruptured: Secondary | ICD-10-CM | POA: Insufficient documentation

## 2017-10-29 DIAGNOSIS — R5383 Other fatigue: Secondary | ICD-10-CM | POA: Insufficient documentation

## 2017-10-29 DIAGNOSIS — I4821 Permanent atrial fibrillation: Secondary | ICD-10-CM

## 2017-10-29 DIAGNOSIS — Z79899 Other long term (current) drug therapy: Secondary | ICD-10-CM | POA: Insufficient documentation

## 2017-10-29 DIAGNOSIS — E669 Obesity, unspecified: Secondary | ICD-10-CM | POA: Insufficient documentation

## 2017-10-29 DIAGNOSIS — G4730 Sleep apnea, unspecified: Secondary | ICD-10-CM

## 2017-10-29 DIAGNOSIS — Z9119 Patient's noncompliance with other medical treatment and regimen: Secondary | ICD-10-CM | POA: Insufficient documentation

## 2017-10-29 DIAGNOSIS — K589 Irritable bowel syndrome without diarrhea: Secondary | ICD-10-CM | POA: Diagnosis not present

## 2017-10-29 NOTE — Addendum Note (Signed)
Encounter addended by: Scarlette Calico, RN on: 10/29/2017 2:43 PM  Actions taken: Visit diagnoses modified, Order list changed, Diagnosis association updated, Sign clinical note

## 2017-10-29 NOTE — Progress Notes (Signed)
CARDIOLOGY CLINIC NOTE  Patient ID: Maurice Wood, male   DOB: 08-28-1933, 83 y.o.   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  Had episode of CP in January 2011 and underwent cath which showed normal coronaries with ef 45% (? artificially low due to AF). Echo  2/11: 60-65%  Echo 7/16  EF 60% Mod MR.  ECHO 2017 EF 60-65%.    Evaluated at [facility] 09/06/13 with chest pain. CEs and ECG negative. (ECG with minimal nonspecific ST abnormality v4-v6)  Sharp pain on left side of his chest while at church. Lasted about 90 minutes and resolved. Received aspirin and nitroglycerin.   He returns for yearly follow up. Last month he was evaluated in [facility] ED with weakness. He was sent home without any medication changes. There was question about cough medication with codeine.  Overall feeling fine. Has had a flu-like illness for a while but now getting better.  Having day time fatigue. Has not used CPAP since 2009. Denies SOB/PND/Orthopnea. Appetite ok. No fever or chills. Not weighing at home. No bleeding problems. Taking all medications. No orthopnea, PND or edema.   ROS: All systems negative except as listed in HPI, PMH and Problem List.  Past Medical History:  Diagnosis Date  . Allergy    rhinitis  . Atrial fibrillation [facility]) Jan 2007   echo 1-07 normal ejection fraction, no significant valvular disease. adenosine cardiolite 1-07  no evidence of ischemia. A 48 hour Holter monitor in 7-07 showed good rate controlw/ chronic atrial fibrillation. Cath 1/11 normal cors. EF 45%. Echo 2-11 60-65%  . Benign prostatic hypertrophy   . Cerebral aneurysm    followed by Dr Estanislado Pandy  . Fatigue   . History of chest pain    a. s/p LHC 05/2014 with normal cors  . Hx of colonic  polyps    diverticulosis  . Hyperlipidemia   . IBS (irritable bowel syndrome)   . Incontinence    Per pt 12/14/11  . Kidney stones   . Obesity   . Obstructive sleep apnea    noncompliant with CPAP  . Retinal vein occlusion     Current Outpatient Medications  Medication Sig Dispense Refill  . acetaminophen (TYLENOL) 650 MG CR tablet Take 1,300 mg by mouth every 8 (eight) hours as needed for pain.    Marland Kitchen apixaban (ELIQUIS) 5 MG TABS tablet Take 1 tablet (5 mg total) by mouth 2 (two) times daily. 180 tablet 3  . cetirizine (ZYRTEC) 10 MG tablet Take 10 mg by mouth every morning.     Marland Kitchen ipratropium (ATROVENT) 0.06 % nasal spray Place 1 spray into both nostrils 4 (four) times daily.     Marland Kitchen loratadine (CLARITIN) 10 MG tablet Take 10 mg by mouth daily as needed for allergies.    . ranitidine (ZANTAC) 150 MG capsule Take 150 mg by mouth daily.     No current facility-administered medications for this encounter.      PHYSICAL EXAM: Vitals:   10/29/17 1402 10/29/17 1403  BP: 122/82 118/80  Pulse: 73   SpO2: 93%    Wt Readings from Last 3 Encounters:  10/29/17 192 lb (87.1 kg)  10/03/17 194 lb (88 kg)  11/07/16 187 lb 1.9 oz (84.9 kg)   Physical exam: General:  Well  appearing. No resp difficulty HEENT: normal Neck: supple. no JVD. Carotids 2+ bilat; no bruits. No lymphadenopathy or thryomegaly appreciated. Cor: PMI nondisplaced. Regular rate & rhythm. No rubs, gallops. Very brief MR murmur at apex Lungs: clear Abdomen: soft, nontender, nondistended. No hepatosplenomegaly. No bruits or masses. Good bowel sounds. Extremities: no cyanosis, clubbing, rash, edema Neuro: alert & orientedx3, cranial nerves grossly intact. moves all 4 extremities w/o difficulty. Affect pleasant    Lab Results  Component Value Date   CHOL 166 02/24/2014   HDL 53.80 02/24/2014   LDLCALC 84 02/24/2014   LDLDIRECT 97.4 11/09/2011   TRIG 143.0 02/24/2014   CHOLHDL 3 02/24/2014    ASSESSMENT &  PLAN:  1. A-fib -  Permanent. Rate controlled. -Rate controlled. . Continue eliquis 5 mg twice a day.    2. Moderate MR - Set up repeat ECHO   3. Fatigue  - Has not used CPAP since 2009. Discussed getting sleep study and he is considering.  Amy Clegg NP-C  2:16 PM   Patient seen and examined with Darrick Grinder, NP. We discussed all aspects of the encounter. I agree with the assessment and plan as stated above.   Doing great. Has chronic AF wich is rate controlled. No bleeding on Eliquis. MR very brief on exam. No HF symptoms.  Will get f/u echo.   Glori Bickers, MD  2:34 PM

## 2017-10-29 NOTE — Patient Instructions (Signed)
Your physician has requested that you have an echocardiogram. Echocardiography is a painless test that uses sound waves to create images of your heart. It provides your doctor with information about the size and shape of your heart and how well your heart's chambers and valves are working. This procedure takes approximately one hour. There are no restrictions for this procedure.  Your physician has recommended that you have a sleep study. This test records several body functions during sleep, including: brain activity, eye movement, oxygen and carbon dioxide blood levels, heart rate and rhythm, breathing rate and rhythm, the flow of air through your mouth and nose, snoring, body muscle movements, and chest and belly movement.  We will contact you in 1 year to schedule your next appointment.

## 2017-11-04 ENCOUNTER — Ambulatory Visit (HOSPITAL_COMMUNITY): Payer: PPO | Attending: Cardiovascular Disease

## 2017-11-04 ENCOUNTER — Other Ambulatory Visit: Payer: Self-pay

## 2017-11-04 DIAGNOSIS — G4733 Obstructive sleep apnea (adult) (pediatric): Secondary | ICD-10-CM | POA: Insufficient documentation

## 2017-11-04 DIAGNOSIS — Z8249 Family history of ischemic heart disease and other diseases of the circulatory system: Secondary | ICD-10-CM | POA: Insufficient documentation

## 2017-11-04 DIAGNOSIS — I4821 Permanent atrial fibrillation: Secondary | ICD-10-CM

## 2017-11-04 DIAGNOSIS — I080 Rheumatic disorders of both mitral and aortic valves: Secondary | ICD-10-CM | POA: Insufficient documentation

## 2017-11-04 DIAGNOSIS — I10 Essential (primary) hypertension: Secondary | ICD-10-CM | POA: Diagnosis not present

## 2017-11-04 DIAGNOSIS — E785 Hyperlipidemia, unspecified: Secondary | ICD-10-CM | POA: Insufficient documentation

## 2017-11-04 DIAGNOSIS — Z87891 Personal history of nicotine dependence: Secondary | ICD-10-CM | POA: Insufficient documentation

## 2017-11-04 DIAGNOSIS — I482 Chronic atrial fibrillation: Secondary | ICD-10-CM

## 2017-11-07 ENCOUNTER — Institutional Professional Consult (permissible substitution): Payer: PPO | Admitting: Neurology

## 2017-11-07 ENCOUNTER — Encounter: Payer: Self-pay | Admitting: Neurology

## 2017-11-08 DIAGNOSIS — D2261 Melanocytic nevi of right upper limb, including shoulder: Secondary | ICD-10-CM | POA: Diagnosis not present

## 2017-11-08 DIAGNOSIS — D692 Other nonthrombocytopenic purpura: Secondary | ICD-10-CM | POA: Diagnosis not present

## 2017-11-08 DIAGNOSIS — D1801 Hemangioma of skin and subcutaneous tissue: Secondary | ICD-10-CM | POA: Diagnosis not present

## 2017-11-08 DIAGNOSIS — L720 Epidermal cyst: Secondary | ICD-10-CM | POA: Diagnosis not present

## 2017-11-08 DIAGNOSIS — L821 Other seborrheic keratosis: Secondary | ICD-10-CM | POA: Diagnosis not present

## 2017-11-08 DIAGNOSIS — D225 Melanocytic nevi of trunk: Secondary | ICD-10-CM | POA: Diagnosis not present

## 2017-11-08 DIAGNOSIS — L570 Actinic keratosis: Secondary | ICD-10-CM | POA: Diagnosis not present

## 2017-11-08 DIAGNOSIS — Z85828 Personal history of other malignant neoplasm of skin: Secondary | ICD-10-CM | POA: Diagnosis not present

## 2017-11-09 ENCOUNTER — Ambulatory Visit (HOSPITAL_BASED_OUTPATIENT_CLINIC_OR_DEPARTMENT_OTHER): Payer: PPO | Attending: Internal Medicine | Admitting: Cardiology

## 2017-11-09 VITALS — Ht 70.0 in | Wt 190.0 lb

## 2017-11-09 DIAGNOSIS — G4733 Obstructive sleep apnea (adult) (pediatric): Secondary | ICD-10-CM | POA: Diagnosis not present

## 2017-11-09 DIAGNOSIS — I4891 Unspecified atrial fibrillation: Secondary | ICD-10-CM | POA: Insufficient documentation

## 2017-11-09 DIAGNOSIS — G4730 Sleep apnea, unspecified: Secondary | ICD-10-CM

## 2017-11-11 DIAGNOSIS — R159 Full incontinence of feces: Secondary | ICD-10-CM | POA: Diagnosis not present

## 2017-11-11 DIAGNOSIS — N3941 Urge incontinence: Secondary | ICD-10-CM | POA: Diagnosis not present

## 2017-11-27 NOTE — Procedures (Signed)
NAME: Maurice Wood DATE OF BIRTH:  September 19, 1933 MEDICAL RECORD NUMBER [id number]  LOCATION: [facility]  PHYSICIAN: Traci Turner  DATE OF STUDY: 11/09/2017  SLEEP STUDY TYPE: Positive Airway Pressure Titration               REFERRING PHYSICIAN: [name], Shaune Pascal, MD  INDICATION FOR STUDY: CHF, OSA  EPWORTH SLEEPINESS SCORE:   HEIGHT: '5\' 10"'  (177.8 cm)  WEIGHT: 190 lb (86.2 kg)    Body mass index is 27.26 kg/m.  NECK SIZE: 16 in.  CLINICAL INFORMATION Sleep Study Type: Split Night CPAP  Indication for sleep study: Congestive Heart Failure, Fatigue, OSA  Epworth Sleepiness Score: 4  SLEEP STUDY TECHNIQUE As per the AASM Manual for the Scoring of Sleep and Associated Events v2.3 (April 2016) with a hypopnea requiring 4% desaturations.  The channels recorded and monitored were frontal, central and occipital EEG, electrooculogram (EOG), submentalis EMG (chin), nasal and oral airflow, thoracic and abdominal wall motion, anterior tibialis EMG, snore microphone, electrocardiogram, and pulse oximetry. Continuous positive airway pressure (CPAP) was initiated when the patient met split night criteria and was titrated according to treat sleep-disordered breathing.  MEDICATIONS Medications self-administered by patient taken the night of the study : N/A  RESPIRATORY PARAMETERS Diagnostic Total AHI (/hr): 40.8  RDI (/hr):46.3  OA Index (/hr): 20.4  CA Index (/hr): 0.4 REM AHI (/hr): 43.6  NREM AHI (/hr):40.7  Supine AHI (/hr):40.8  Non-supine AHI (/hr):N/A Min O2 Sat (%):79.00  Mean O2 (%): 88.21  Time below 88% (min):94.2   Titration Optimal Pressure (cm):N/A  AHI at Optimal Pressure (/hr):N/A  Min O2 at Optimal Pressure (%):82.00 Supine % at Optimal (%):N/A  Sleep % at Optimal (%):N/A   SLEEP ARCHITECTURE The recording time for the entire night was 426.0 minutes.  During a baseline period of 219.9 minutes, the patient slept for 161.9 minutes in REM  and nonREM, yielding a sleep efficiency of 73.7%. Sleep onset after lights out was 16.9 minutes with a REM latency of 189.0 minutes. The patient spent 33.96% of the night in stage N1 sleep, 56.77% in stage N2 sleep, 5.87% in stage N3 and 3.40% in REM.  During the titration period of 200.1 minutes, the patient slept for 150.0 minutes in REM and nonREM, yielding a sleep efficiency of 75.0%. Sleep onset after CPAP initiation was 19.0 minutes with a REM latency of 34.0 minutes. The patient spent 7.33% of the night in stage N1 sleep, 35.00% in stage N2 sleep, 28.67% in stage N3 and 29.00% in REM.  CARDIAC DATA The 2 lead EKG demonstrated atrial fibrillation. The mean heart rate was 78.16 beats per minute. Other EKG findings include: PVCs.  LEG MOVEMENT DATA The total Periodic Limb Movements of Sleep (PLMS) were 168. The PLMS index was 31.90 .  IMPRESSIONS - Severe obstructive sleep apnea occurred during the diagnostic portion of the study (AHI = 40.8/hour). An optimal PAP pressure could not be selected for this patient based on the available study data. - No significant central sleep apnea occurred during the diagnostic portion of the study (CAI = 0.4/hour). - Moderate oxygen desaturation was noted during the diagnostic portion of the study (Min O2 =79.00%). - The patient snored with moderate snoring volume during the diagnostic portion of the study. - EKG findings include PVCs and atrial fibrillation. - Moderate periodic limb movements of sleep occurred during the study.  DIAGNOSIS - Obstructive Sleep Apnea (327.23 [G47.33 ICD-10]) - Atrial Fibrillation  RECOMMENDATIONS - Recommend  BiPAP titration given sub-optimal CPAP titration. - Avoid alcohol, sedatives and other CNS depressants that may worsen sleep apnea and disrupt normal sleep architecture. - Sleep hygiene should be reviewed to assess factors that may improve sleep quality. - Weight management and regular exercise should be initiated or  continued.    [place], American Board of Sleep Medicine  ELECTRONICALLY SIGNED ON:  11/27/2017, 6:31 PM [place] PH: [phone]   FX: [phone] [place]

## 2017-11-29 ENCOUNTER — Telehealth: Payer: Self-pay | Admitting: *Deleted

## 2017-11-29 NOTE — Telephone Encounter (Signed)
Informed patient of titration results and verbalized understanding was indicated. Patient understands he has sleep apnea and had an unsuccessful  cpap Titration. Patient understands Dr Radford Pax recommends a repeat titration with a BiPAP. Patient understands his titration will be done at [facility] sleep lab. Patient understands he will receive a sleep packet in a week or so. Patient understands to call if he does not receive the sleep packet in a timely manner. Patient agrees with treatment and thanked me for call.

## 2017-11-29 NOTE — Telephone Encounter (Signed)
-----   Message from Sueanne Margarita, MD sent at 11/27/2017  6:34 PM EST ----- Please let patient know that they have sleep apnea but unsuccessful CPAP titration and recommend repeat titration with BiPAP . Please set up titration in the sleep lab.

## 2017-11-29 NOTE — Telephone Encounter (Signed)
Sent to sleep pool. 

## 2017-12-19 ENCOUNTER — Emergency Department (HOSPITAL_COMMUNITY)
Admission: EM | Admit: 2017-12-19 | Discharge: 2017-12-19 | Disposition: A | Discharge status: Home / Self Care | Payer: PPO | Attending: Emergency Medicine | Admitting: Emergency Medicine

## 2017-12-19 ENCOUNTER — Emergency Department (HOSPITAL_COMMUNITY): Payer: PPO

## 2017-12-19 ENCOUNTER — Other Ambulatory Visit: Payer: Self-pay

## 2017-12-19 ENCOUNTER — Encounter (HOSPITAL_COMMUNITY): Payer: Self-pay

## 2017-12-19 DIAGNOSIS — Z79899 Other long term (current) drug therapy: Secondary | ICD-10-CM | POA: Insufficient documentation

## 2017-12-19 DIAGNOSIS — S8001XA Contusion of right knee, initial encounter: Secondary | ICD-10-CM | POA: Insufficient documentation

## 2017-12-19 DIAGNOSIS — I4891 Unspecified atrial fibrillation: Secondary | ICD-10-CM | POA: Diagnosis not present

## 2017-12-19 DIAGNOSIS — Y999 Unspecified external cause status: Secondary | ICD-10-CM | POA: Insufficient documentation

## 2017-12-19 DIAGNOSIS — S0121XA Laceration without foreign body of nose, initial encounter: Secondary | ICD-10-CM | POA: Diagnosis not present

## 2017-12-19 DIAGNOSIS — Y9389 Activity, other specified: Secondary | ICD-10-CM | POA: Insufficient documentation

## 2017-12-19 DIAGNOSIS — W19XXXA Unspecified fall, initial encounter: Secondary | ICD-10-CM | POA: Diagnosis not present

## 2017-12-19 DIAGNOSIS — E119 Type 2 diabetes mellitus without complications: Secondary | ICD-10-CM | POA: Insufficient documentation

## 2017-12-19 DIAGNOSIS — M25561 Pain in right knee: Secondary | ICD-10-CM | POA: Diagnosis not present

## 2017-12-19 DIAGNOSIS — S0990XA Unspecified injury of head, initial encounter: Secondary | ICD-10-CM | POA: Diagnosis not present

## 2017-12-19 DIAGNOSIS — D689 Coagulation defect, unspecified: Secondary | ICD-10-CM | POA: Diagnosis not present

## 2017-12-19 DIAGNOSIS — Z7901 Long term (current) use of anticoagulants: Secondary | ICD-10-CM | POA: Insufficient documentation

## 2017-12-19 DIAGNOSIS — S199XXA Unspecified injury of neck, initial encounter: Secondary | ICD-10-CM | POA: Diagnosis not present

## 2017-12-19 DIAGNOSIS — S0993XA Unspecified injury of face, initial encounter: Secondary | ICD-10-CM | POA: Diagnosis not present

## 2017-12-19 DIAGNOSIS — Z87891 Personal history of nicotine dependence: Secondary | ICD-10-CM | POA: Diagnosis not present

## 2017-12-19 DIAGNOSIS — Y92002 Bathroom of unspecified non-institutional (private) residence single-family (private) house as the place of occurrence of the external cause: Secondary | ICD-10-CM | POA: Insufficient documentation

## 2017-12-19 MED ORDER — LIDOCAINE HCL (PF) 1 % IJ SOLN
5.0000 mL | Freq: Once | INTRAMUSCULAR | Status: AC
  Administered 2017-12-19: 5 mL
  Filled 2017-12-19: qty 5

## 2017-12-19 NOTE — ED Triage Notes (Signed)
Pt reports falling at 0330, pt presents w/ 3/10 right knee pain redness and abrasion, and nose lac. Pt take Elaquis TID. Pt denies head pain and neck pain.

## 2017-12-19 NOTE — ED Provider Notes (Signed)
  Physical Exam  BP 129/86   Pulse 81   Temp 98.1 F (36.7 C) (Oral)   Resp 18   SpO2 96%   Physical Exam  ED Course/Procedures     .Marland KitchenLaceration Repair Date/Time: 12/19/2017 6:53 AM Performed by: Shary Decamp, PA-C Authorized by: Shary Decamp, PA-C   Consent:    Consent obtained:  Verbal   Consent given by:  Patient   Risks discussed:  Infection, pain, poor cosmetic result and poor wound healing   Alternatives discussed:  No treatment Anesthesia (see MAR for exact dosages):    Anesthesia method:  Local infiltration   Local anesthetic:  Lidocaine 1% w/o epi Laceration details:    Location:  Face   Face location:  Nose   Length (cm):  1.5 Repair type:    Repair type:  Simple Pre-procedure details:    Preparation:  Patient was prepped and draped in usual sterile fashion Exploration:    Hemostasis achieved with:  Direct pressure   Wound exploration: wound explored through full range of motion   Treatment:    Area cleansed with:  Betadine and Hibiclens   Amount of cleaning:  Extensive   Irrigation solution:  Sterile saline   Irrigation method:  Pressure wash Skin repair:    Repair method:  Sutures   Suture size:  5-0   Wound skin closure material used: vicryl rapide.   Suture technique:  Simple interrupted   Number of sutures:  5 Approximation:    Approximation:  Close   [place] border: well-aligned   Post-procedure details:    Dressing:  Open (no dressing)   Patient tolerance of procedure:  Tolerated well, no immediate complications       Shary Decamp, PA-C 12/19/17 0654    Isla Pence, MD 12/19/17 [phone]

## 2017-12-19 NOTE — Discharge Instructions (Signed)
Please read and follow all provided instructions.  Your diagnoses today include:  1. Laceration of nose, initial encounter   2. Fall, initial encounter     Tests performed today include: Vital signs. See below for your results today.   Medications prescribed:   Take any prescribed medications only as directed.   Home care instructions:  Follow any educational materials and wound care instructions contained in this packet.   You may shower and wash the area with soap and water, just be sure to pat the area dry and not rub over the stitches. Do no put your stiches underwater (in a bath, pool, or lake). Getting stiches wet can slow down healing and increase your chances of getting an infection. You may apply Bacitracin or Neosporin twice a day for 7 days, and keep the ara clean with  bandage or gauze. Do not apply alcohol or hydrogen peroxide. Cover the area if it draining or weeping.   Return instructions:  Return to the Emergency Department if you have: Fever Worsening pain Worsening swelling of the wound Pus draining from the wound Redness of the skin that moves away from the wound, especially if it streaks away from the affected area  Any other emergent concerns  Your vital signs today were: BP 126/84    Pulse 89    Temp 98.1 F (36.7 C) (Oral)    Resp 18    SpO2 96%  If your blood pressure (BP) was elevated above 135/85 this visit, please have this repeated by your doctor within one month. --------------

## 2017-12-19 NOTE — ED Provider Notes (Signed)
[facility] DEPT Provider Note   CSN: [id number] Arrival date & time: 12/19/17  0358     History   Chief Complaint Chief Complaint  Patient presents with  . Fall  . Knee Injury    Right  . Facial Laceration    HPI Maurice Wood is a 84 y.o. male.  The patient got up to the bathroom tonight around 3:30 and fell causing facial laceration and right knee pain. No LOC, nausea or vomiting. He denies headache. He has been ambulatory since falling and has no hip pain. He denies hitting his chest or abdomen in the fall. No pre-fall dizziness, chest pain. He has been well lately without vomiting or diarrhea illnesses. No new medications. He is anticoagulated on Eliquis for atrial fibrillation.       Past Medical History:  Diagnosis Date  . Allergy    rhinitis  . Atrial fibrillation [facility]) Jan 2007   echo 1-07 normal ejection fraction, no significant valvular disease. adenosine cardiolite 1-07  no evidence of ischemia. A 48 hour Holter monitor in 7-07 showed good rate controlw/ chronic atrial fibrillation. Cath 1/11 normal cors. EF 45%. Echo 2-11 60-65%  . Benign prostatic hypertrophy   . Cerebral aneurysm    followed by Dr Estanislado Pandy  . Fatigue   . History of chest pain    a. s/p LHC 05/2014 with normal cors  . Hx of colonic polyps    diverticulosis  . Hyperlipidemia   . IBS (irritable bowel syndrome)   . Incontinence    Per pt 12/14/11  . Kidney stones   . Obesity   . Obstructive sleep apnea    noncompliant with CPAP  . Retinal vein occlusion     Patient Active Problem List   Diagnosis Date Noted  . Bilateral lower extremity edema 06/02/2015  . UTI (lower urinary tract infection) 05/25/2015  . Sepsis ([place]) 05/25/2015  . Fever 05/25/2015  . Cough 05/25/2015  . Kidney stone 05/25/2015  . Hypokalemia 05/25/2015  . Acute urinary retention   . H/O cardiac catheterizationn 12/2009 with normal coronary arteries 05/30/2014  . Low back pain  12/17/2013  . Chest pain 09/10/2013  . Urinary incontinence 06/04/2011  . Long term current use of anticoagulant 01/12/2011  . DM type 2 (diabetes mellitus, type 2) ([place]) 07/31/2010  . HEMORRHOIDS-INTERNAL 06/27/2010  . Sleep apnea 06/27/2010  . DIVERTICULOSIS OF COLON 01/03/2010  . GERD 11/22/2009  . IRRITABLE BOWEL SYNDROME 11/22/2009  . ERECTILE DYSFUNCTION 06/06/2009  . BPH (benign prostatic hyperplasia) 06/18/2008  . PSA, INCREASED 12/19/2007  . OBSTRUCTIVE SLEEP APNEA 07/24/2007  . DISSECTION, CAROTID ARTERY 07/24/2007  . TESTOSTERONE DEFICIENCY 07/23/2007  . HLD (hyperlipidemia) 07/23/2007  . COLONIC POLYPS, HX OF 07/23/2007  . Atrial fibrillation, permanent ([place]) 07/14/2007    Past Surgical History:  Procedure Laterality Date  . CARDIAC CATHETERIZATION  1999  . CHOLECYSTECTOMY    . HERNIA REPAIR    . inguinal and umbilical herniorrhaphy    . LEFT HEART CATHETERIZATION WITH CORONARY ANGIOGRAM N/A 06/01/2014   Procedure: LEFT HEART CATHETERIZATION WITH CORONARY ANGIOGRAM;  Surgeon: Peter M Martinique, MD;  Location: [facility] CATH LAB;  Service: Cardiovascular;  Laterality: N/A;  . LITHOTRIPSY    . TONSILLECTOMY AND ADENOIDECTOMY  as a child       Home Medications    Prior to Admission medications   Medication Sig Start Date End Date Taking? Authorizing Provider  acetaminophen (TYLENOL) 650 MG CR tablet Take 1,300 mg by mouth  every 8 (eight) hours as needed for pain.    [provider]  apixaban (ELIQUIS) 5 MG TABS tablet Take 1 tablet (5 mg total) by mouth 2 (two) times daily. 12/14/16   [name], Shaune Pascal, MD  cetirizine (ZYRTEC) 10 MG tablet Take 10 mg by mouth every morning.     [provider]  ipratropium (ATROVENT) 0.06 % nasal spray Place 1 spray into both nostrils 4 (four) times daily.  10/02/17   [provider]  loratadine (CLARITIN) 10 MG tablet Take 10 mg by mouth daily as needed for allergies.    [provider]  ranitidine  (ZANTAC) 150 MG capsule Take 150 mg by mouth daily.    [provider]    Family History Family History  Problem Relation Age of Onset  . Cancer Father        lung  . Alcohol abuse Father   . Aneurysm Father        Aortic aneurysm  . Cancer Maternal Grandmother        colon  . Sudden death Mother   . Heart disease Paternal Grandfather   . Diabetes Son   . Diabetes Maternal Uncle   . Diabetes Paternal Uncle     Social History Social History   Tobacco Use  . Smoking status: Former Smoker    Types: Cigarettes  . Smokeless tobacco: Never Used  . Tobacco comment: quit 1973  Substance Use Topics  . Alcohol use: Yes    Alcohol/week: 0.6 oz    Types: 1 Cans of beer per week    Comment: Twice a month  . Drug use: No     Allergies   Clarithromycin; Epinephrine; and Nimodipine   Review of Systems Review of Systems  Constitutional: Negative for chills and fever.  HENT: Positive for facial swelling. Negative for trouble swallowing.   Eyes: Negative for visual disturbance.  Respiratory: Negative.  Negative for shortness of breath.   Cardiovascular: Negative.  Negative for chest pain.  Gastrointestinal: Negative.  Negative for abdominal pain, nausea and vomiting.  Musculoskeletal: Negative for back pain and neck pain.       See HPI.  Skin: Negative.   Neurological: Negative.  Negative for dizziness, syncope, light-headedness and headaches.     Physical Exam Updated Vital Signs BP 129/86   Pulse 81   Temp 98.1 F (36.7 C) (Oral)   Resp 18   SpO2 96%   Physical Exam  Constitutional: He is oriented to person, place, and time. He appears well-developed and well-nourished.  HENT:  Head: Normocephalic.  Laceration of nasal septum but not nostrils. No epistaxis. No facial bony tenderness. No dental injury or malocclusion.  Eyes: EOM are normal. Pupils are equal, round, and reactive to light.  Neck: Normal range of motion. Neck supple.  Cardiovascular: Normal  rate, regular rhythm and intact distal pulses.  No murmur heard. Pulmonary/Chest: Effort normal and breath sounds normal. He has no wheezes. He has no rales. He exhibits no tenderness.  Abdominal: Soft. Bowel sounds are normal. There is no tenderness. There is no rebound and no guarding.  Musculoskeletal: Normal range of motion.  Right knee is swollen with superficial abrasions anteriorly. FROM. Tender over patella without deformity. No midline or paracervical tenderness.   Neurological: He is alert and oriented to person, place, and time.  Skin: Skin is warm and dry. No rash noted.  Psychiatric: He has a normal mood and affect.     ED Treatments / Results  Labs (all labs ordered are listed, but only abnormal results are displayed) Labs Reviewed - No data to display  EKG  EKG Interpretation  Date/Time:  Thursday December 19 2017 04:27:27 EST Ventricular Rate:  86 PR Interval:    QRS Duration: 102 QT Interval:  382 QTC Calculation: 455 R Axis:   -6 Text Interpretation:  Wandering atrial pacemaker Low voltage, extremity and precordial leads Baseline wander in lead(s) V6 Confirmed by Isla Pence [id number]) on 12/19/2017 4:57:00 AM       Radiology No results found.  Procedures Procedures (including critical care time)  Medications Ordered in ED Medications  lidocaine (PF) (XYLOCAINE) 1 % injection 5 mL (not administered)     Initial Impression / Assessment and Plan / ED Course  I have reviewed the triage vital signs and the nursing notes.  Pertinent labs & imaging results that were available during my care of the patient were reviewed by me and considered in my medical decision making (see chart for details).     Patient presents after fall during the night with right knee and facial pain/injury. Patient has coagulopathy secondary to Eliquis. CT's of head, neck and face ordered as well as right knee.   Imaging is pending. Patient care signed out to Maurice Decamp, PA-C,  for review of x-rays, re-evaluation of the patient's condition and laceration repair.   Final Clinical Impressions(s) / ED Diagnoses   Final diagnoses:  None   1. Fall 2. Facial laceration 3. Contusion right knee 4. Coagulopathy   ED Discharge Orders    None       Charlann Lange, PA-C 12/19/17 0630    Isla Pence, MD 12/19/17 [phone]

## 2018-01-10 ENCOUNTER — Telehealth: Payer: Self-pay | Admitting: *Deleted

## 2018-01-10 ENCOUNTER — Other Ambulatory Visit (HOSPITAL_COMMUNITY): Payer: Self-pay | Admitting: Internal Medicine

## 2018-01-10 DIAGNOSIS — G4733 Obstructive sleep apnea (adult) (pediatric): Secondary | ICD-10-CM

## 2018-01-13 ENCOUNTER — Other Ambulatory Visit (HOSPITAL_COMMUNITY): Payer: Self-pay | Admitting: *Deleted

## 2018-01-13 MED ORDER — APIXABAN 5 MG PO TABS: ORAL_TABLET | ORAL | 2 refills | Status: DC

## 2018-01-17 ENCOUNTER — Telehealth: Payer: Self-pay | Admitting: Cardiology

## 2018-01-17 NOTE — Telephone Encounter (Signed)
New message   Patient wife calling to schedule sleep study. Please call  1) What problem are you experiencing? Unable to get sleep study scheduled,  2) Who is your medical equipment company?n/a   Please route to the sleep study assistant.

## 2018-01-21 ENCOUNTER — Other Ambulatory Visit (HOSPITAL_COMMUNITY): Payer: Self-pay | Admitting: *Deleted

## 2018-01-21 DIAGNOSIS — G4730 Sleep apnea, unspecified: Secondary | ICD-10-CM

## 2018-01-21 NOTE — Telephone Encounter (Signed)
Called patient to inform him of his CPAP titration date and was informed by patient's wife (DPR) [facility]) that they have decided to have another provider manage the patients sleep therapy. The sleep assistant apologized to Mr. And Mrs Maurice Wood for not getting back to them in a timely manner and tried to make things right for them but their minds were made up and they had already found another provider.

## 2018-01-21 NOTE — Progress Notes (Signed)
Pts wife called yesterday stating she could not get a call back from [place] office about sleep study. Patient had initial sleep study 12.04.18 and this has been an ongoing issue ever since. Stated she called multiple times and was Toni Amend who handles sleep studies would call her. Patient had an abnormal sleep study and was advised he would need another test that would require precert. Patients wife stated they received authorization but no one has called to schedule appointment. She said she has lost all confidence in Dr.Turner because of the way she feels staff is mistreating her. I placed patients wife on hold and called Dr.Turners office. I spoke with Gae Bon and explained the situation. Gae Bon assured me she would call the patient and get everything scheduled. Today I received a call from patients wife stating she had not heard from [place] and no longer wanted her husband under Dr.Turners care. She wished to be referred to another office. Per Nira Conn I placed an order to Pulmonary they will call and schedule appointment. Pts wife aware and agreeable she thanked our office for our help in resolving this matter.

## 2018-01-21 NOTE — Telephone Encounter (Signed)
Called patient to inform him of his CPAP titration date and was informed by patient's wife (DPR) [facility]) that they have decided to have another provider manage the patients sleep therapy. The sleep assistant apologized to Mr. And Mrs Kushnir for not getting back to them in a timely manner and tried to make things right for them but their minds were made up and they had already found another provider.

## 2018-01-21 NOTE — Telephone Encounter (Signed)
Called patient to inform him of his CPAP titration date and was informed by patient's wife (DPR) [facility]) that they have decided to have another provider manage the patients sleep therapy. The sleep assistant apologized to Mr. And Mrs Freiberger for not getting back to them in a timely manner and tried to make things right for them but their minds were made up and they had already found another provider.

## 2018-01-28 ENCOUNTER — Encounter (HOSPITAL_BASED_OUTPATIENT_CLINIC_OR_DEPARTMENT_OTHER): Payer: PPO

## 2018-02-10 DIAGNOSIS — N3941 Urge incontinence: Secondary | ICD-10-CM | POA: Diagnosis not present

## 2018-02-10 DIAGNOSIS — R159 Full incontinence of feces: Secondary | ICD-10-CM | POA: Diagnosis not present

## 2018-02-20 ENCOUNTER — Ambulatory Visit (INDEPENDENT_AMBULATORY_CARE_PROVIDER_SITE_OTHER): Payer: PPO | Admitting: Neurology

## 2018-02-20 ENCOUNTER — Other Ambulatory Visit: Payer: PPO

## 2018-02-20 ENCOUNTER — Encounter: Payer: Self-pay | Admitting: Neurology

## 2018-02-20 VITALS — BP 94/56 | HR 91 | Ht 70.0 in | Wt 192.2 lb

## 2018-02-20 DIAGNOSIS — G609 Hereditary and idiopathic neuropathy, unspecified: Secondary | ICD-10-CM

## 2018-02-20 LAB — VITAMIN B12: VITAMIN B 12: 206 pg/mL — AB (ref 211–911)

## 2018-02-20 LAB — SEDIMENTATION RATE: Sed Rate: 15 mm/hr (ref 0–20)

## 2018-02-20 LAB — TSH: TSH: 2.53 u[IU]/mL (ref 0.35–4.50)

## 2018-02-20 NOTE — Addendum Note (Signed)
Addended by: Chester Holstein on: 02/20/2018 11:55 AM   Modules accepted: Orders
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
## 2018-02-20 NOTE — Patient Instructions (Addendum)
1.  We will check blood work for ANA, Sed Rate, B12, TSH, SPEP/IFE. Your provider has requested that you have labwork completed today. Please go to [facility] ([address]) on the second floor of this building before leaving the office today. You do not need to check in. If you are not called within 15 minutes please check with the front desk.  2.  We will check nerve study of the legs 3.  At this point, I would not drive.  Keep light on when you go to the bathroom at night 4.  Follow up after testing.

## 2018-02-24 ENCOUNTER — Ambulatory Visit: Payer: PPO

## 2018-02-25 ENCOUNTER — Ambulatory Visit (INDEPENDENT_AMBULATORY_CARE_PROVIDER_SITE_OTHER): Payer: PPO | Admitting: *Deleted

## 2018-02-25 DIAGNOSIS — S46009A Unspecified injury of muscle(s) and tendon(s) of the rotator cuff of unspecified shoulder, initial encounter: Secondary | ICD-10-CM | POA: Diagnosis not present

## 2018-02-25 DIAGNOSIS — E538 Deficiency of other specified B group vitamins: Secondary | ICD-10-CM

## 2018-02-25 DIAGNOSIS — M25511 Pain in right shoulder: Secondary | ICD-10-CM | POA: Diagnosis not present

## 2018-02-25 LAB — PROTEIN ELECTROPHORESIS, SERUM
ALPHA 1: 0.3 g/dL (ref 0.2–0.3)
ALPHA 2: 0.7 g/dL (ref 0.5–0.9)
Albumin ELP: 3.8 g/dL (ref 3.8–4.8)
Beta 2: 0.3 g/dL (ref 0.2–0.5)
Beta Globulin: 0.4 g/dL (ref 0.4–0.6)
Gamma Globulin: 1.2 g/dL (ref 0.8–1.7)
TOTAL PROTEIN: 6.6 g/dL (ref 6.1–8.1)

## 2018-02-25 LAB — ANA: Anti Nuclear Antibody(ANA): NEGATIVE

## 2018-02-25 LAB — IMMUNOFIXATION ELECTROPHORESIS
IMMUNOFIX ELECTR INT: NOT DETECTED
IgG (Immunoglobin G), Serum: 1271 mg/dL (ref 694–1618)
IgM, Serum: 13 mg/dL — ABNORMAL LOW (ref 48–271)
Immunoglobulin A: <5 mg/dL — ABNORMAL LOW (ref 81–463)

## 2018-02-25 MED ORDER — CYANOCOBALAMIN 1000 MCG/ML IJ SOLN
1000.0000 ug | Freq: Once | INTRAMUSCULAR | Status: AC
  Administered 2018-02-25: 1000 ug via INTRAMUSCULAR

## 2018-02-26 ENCOUNTER — Ambulatory Visit (INDEPENDENT_AMBULATORY_CARE_PROVIDER_SITE_OTHER): Payer: PPO | Admitting: Neurology

## 2018-02-26 DIAGNOSIS — E538 Deficiency of other specified B group vitamins: Secondary | ICD-10-CM | POA: Diagnosis not present

## 2018-02-26 MED ORDER — CYANOCOBALAMIN 1000 MCG/ML IJ SOLN
1000.0000 ug | Freq: Once | INTRAMUSCULAR | Status: AC
  Administered 2018-02-26: 1000 ug via INTRAMUSCULAR

## 2018-02-26 NOTE — Progress Notes (Signed)
B12 injection to right deltoid with no apparent complications.   

## 2018-02-27 ENCOUNTER — Ambulatory Visit (INDEPENDENT_AMBULATORY_CARE_PROVIDER_SITE_OTHER): Payer: PPO | Admitting: *Deleted

## 2018-02-27 ENCOUNTER — Ambulatory Visit (INDEPENDENT_AMBULATORY_CARE_PROVIDER_SITE_OTHER): Payer: PPO | Admitting: Neurology

## 2018-02-27 DIAGNOSIS — G609 Hereditary and idiopathic neuropathy, unspecified: Secondary | ICD-10-CM | POA: Diagnosis not present

## 2018-02-27 DIAGNOSIS — E538 Deficiency of other specified B group vitamins: Secondary | ICD-10-CM

## 2018-02-27 MED ORDER — CYANOCOBALAMIN 1000 MCG/ML IJ SOLN
1000.0000 ug | Freq: Once | INTRAMUSCULAR | Status: AC
  Administered 2018-02-27: 1000 ug via INTRAMUSCULAR

## 2018-02-27 NOTE — Procedures (Signed)
[facility]  [place], [place]  [place], [place] [address] Tel: [phone] Fax:  [phone] Test Date:  02/27/2018  Patient: Maurice Wood DOB: [birth year] Physician: Narda Amber, DO  Sex: Male Height: 5\' 10"  Ref Phys: Metta Clines, DO  ID#: [id number] Temp: 32.6C Technician:    Patient Complaints: This is an 84 year old man referred for evaluation of neuropathy.  NCV & EMG Findings: Extensive electrodiagnostic testing of the right lower extremity and additional studies of the left shows:  1. Bilateral sural and superficial peroneal sensory responses are absent. 2. Bilateral peroneal motor responses at the extensor digitorum brevis are absent, and normal at the tibialis anterior. Bilateral tibial motor responses show reduced amplitude. 3. Bilateral tibial H reflex studies show prolonged latency. 4. Chronic motor axon loss changes are seen affecting the muscles below the knee, without accompanied active denervation.  Impression: The electrophysiologic findings are most consistent with a chronic and symmetric sensorimotor polyneuropathy, axon loss in type, affecting the lower extremities.   ___________________________ Narda Amber, DO    Nerve Conduction Studies Anti Sensory Summary Table   Site NR Peak (ms) Norm Peak (ms) P-T Amp (V) Norm P-T Amp  Left Sup Peroneal Anti Sensory (Ant Lat Mall)  32.6C  12 cm NR  <4.6  >3  Right Sup Peroneal Anti Sensory (Ant Lat Mall)  32.6C  12 cm NR  <4.6  >3  Left Sural Anti Sensory (Lat Mall)  32.6C  Calf NR  <4.6  >3  Right Sural Anti Sensory (Lat Mall)  32.6C  Calf NR  <4.6  >3   Motor Summary Table   Site NR Onset (ms) Norm Onset (ms) O-P Amp (mV) Norm O-P Amp Site1 Site2 Delta-0 (ms) Dist (cm) Vel (m/s) Norm Vel (m/s)  Left Peroneal Motor (Ext Dig Brev)  32.6C  Ankle NR  <6.0  >2.5 B Fib Ankle  0.0  >40  B Fib NR     Poplt B Fib  0.0  >40  Poplt NR            Right Peroneal Motor (Ext Dig Brev)  32.6C    Ankle NR  <6.0  >2.5 B Fib Ankle  0.0  >40  B Fib NR     Poplt B Fib  0.0  >40  Poplt NR            Left Peroneal TA Motor (Tib Ant)  32.6C  Fib Head    4.0 <4.5 3.4 >3 Poplit Fib Head 1.2 9.0 75 >40  Poplit    5.2  3.2         Right Peroneal TA Motor (Tib Ant)  32.6C  Fib Head    2.9 <4.5 3.8 >3 Poplit Fib Head 1.6 10.0 63 >40  Poplit    4.5  3.7         Left Tibial Motor (Abd Hall Brev)  32.6C  Ankle    5.3 <6.0 1.1 >4 Knee Ankle 8.5 42.0 49 >40  Knee    13.8  0.6         Right Tibial Motor (Abd Hall Brev)  32.6C  Ankle    5.2 <6.0 1.0 >4 Knee Ankle 10.0 41.0 41 >40  Knee    15.2  0.6          H Reflex Studies   NR H-Lat (ms) Lat Norm (ms) L-R H-Lat (ms)  Left Tibial (Gastroc)  32.6C     36.46 <35 0.00  Right Tibial (  Gastroc)  32.6C     36.46 <35 0.00   EMG   Side Muscle Ins Act Fibs Psw Fasc Number Recrt Dur Dur. Amp Amp. Poly Poly. Comment  Right AntTibialis Nml Nml Nml Nml 1- Rapid Some 1+ Some 1+ Nml Nml N/A  Right BicepsFemS Nml Nml Nml Nml Nml Nml Nml Nml Nml Nml Nml Nml N/A  Right Gastroc Nml Nml Nml Nml 1- Rapid Some 1+ Some 1+ Nml Nml N/A  Right RectFemoris Nml Nml Nml Nml Nml Nml Nml Nml Nml Nml Nml Nml N/A  Left BicepsFemS Nml Nml Nml Nml Nml Nml Nml Nml Nml Nml Nml Nml N/A  Left AntTibialis Nml Nml Nml Nml 1- Rapid Some 1+ Some 1+ Nml Nml N/A  Left Gastroc Nml Nml Nml Nml 1- Rapid Some 1+ Some 1+ Nml Nml N/A  Left RectFemoris Nml Nml Nml Nml Nml Nml Nml Nml Nml Nml Nml Nml N/A      Waveforms:

## 2018-02-28 ENCOUNTER — Ambulatory Visit (INDEPENDENT_AMBULATORY_CARE_PROVIDER_SITE_OTHER): Payer: PPO

## 2018-02-28 ENCOUNTER — Other Ambulatory Visit: Payer: Self-pay

## 2018-02-28 DIAGNOSIS — E538 Deficiency of other specified B group vitamins: Secondary | ICD-10-CM | POA: Diagnosis not present

## 2018-02-28 MED ORDER — CYANOCOBALAMIN 1000 MCG/ML IJ SOLN: 1000.0000 ug | Freq: Once | INTRAMUSCULAR | 15 refills | Status: AC

## 2018-02-28 MED ORDER — CYANOCOBALAMIN 1000 MCG/ML IJ SOLN
1000.0000 ug | Freq: Once | INTRAMUSCULAR | Status: AC
  Administered 2018-02-28: 1000 ug via INTRAMUSCULAR

## 2018-03-04 DIAGNOSIS — M25511 Pain in right shoulder: Secondary | ICD-10-CM | POA: Diagnosis not present

## 2018-03-07 ENCOUNTER — Encounter: Payer: Self-pay | Admitting: Pulmonary Disease

## 2018-03-07 ENCOUNTER — Ambulatory Visit (INDEPENDENT_AMBULATORY_CARE_PROVIDER_SITE_OTHER): Payer: PPO | Admitting: Pulmonary Disease

## 2018-03-07 VITALS — BP 124/72 | HR 89 | Ht 70.0 in | Wt 191.8 lb

## 2018-03-07 DIAGNOSIS — G4733 Obstructive sleep apnea (adult) (pediatric): Secondary | ICD-10-CM

## 2018-03-07 NOTE — Patient Instructions (Signed)
Will arrange for in lab CPAP titration study and arrange for follow up after sleep study reviewed

## 2018-03-07 NOTE — Progress Notes (Signed)
   Subjective:    Patient ID: Maurice Wood, male    DOB: 1933/05/30, 84 y.o.   MRN: [id number]  HPI    Review of Systems  Constitutional: Negative for fever and unexpected weight change.  HENT: Positive for dental problem, postnasal drip and sinus pressure. Negative for congestion, ear pain, nosebleeds, rhinorrhea, sneezing, sore throat and trouble swallowing.   Eyes: Negative for redness and itching.  Respiratory: Positive for shortness of breath. Negative for cough, chest tightness and wheezing.   Cardiovascular: Positive for palpitations and leg swelling.       A-fib   Gastrointestinal: Negative for nausea and vomiting.  Genitourinary: Negative for dysuria.  Musculoskeletal: Negative for joint swelling.  Skin: Negative for rash.  Allergic/Immunologic: Positive for environmental allergies. Negative for food allergies and immunocompromised state.  Neurological: Positive for headaches.  Hematological: Does not bruise/bleed easily.  Psychiatric/Behavioral: Negative for dysphoric mood. The patient is not nervous/anxious.        Objective:   Physical Exam        Assessment & Plan:

## 2018-03-07 NOTE — Progress Notes (Signed)
[facility], [facility]  Chief Complaint  Patient presents with  . New Consult    Sleep consult, split night study already done     Vital signs: BP 124/72 (BP Location: Right Arm, Cuff Size: Normal)   Pulse 89   Ht 5\' 10"  (1.778 m)   Wt 191 lb 12.8 oz (87 kg)   SpO2 97%   BMI 27.52 kg/m   History of Present Illness: Maurice Wood is a 84 y.o. male for evaluation of sleep problems.  He has been followed by Dr. Sung Amabile.  He has history of sleep apnea, but got tired of using CPAP.  He was noted to have daytime fatigue and shallow breathing while asleep.  He had sleep study in December 2018 and this showed severe sleep apnea.  He had trouble getting titration study set up, and decided to have referral here.  He goes to sleep at 10 pm.  He falls asleep in 20 minutes.  He wakes up some times to use the bathroom.  He gets out of bed at 8 am.  He feels okay in the morning, but gets more tired as the day goes on.  He denies morning headache.  He does not use anything to help him fall sleep or stay awake.  He denies sleep walking, sleep talking, bruxism, or nightmares.  There is no history of restless legs.  He denies sleep hallucinations, sleep paralysis, or cataplexy.  The Epworth score is 3 out of 24.    Physical Exam:  General - pleasant Eyes - pupils reactive ENT - no sinus tenderness, no oral exudate, no LAN Cardiac - regular, no murmur Chest - no wheeze, rales Abd - soft, non tender Ext - no edema Skin - no rashes Neuro - normal strength Psych - normal mood  Assessment/Plan:  Obstructive sleep apnea. - We discussed how sleep apnea can affect various health problems, including risks for hypertension, cardiovascular disease, and diabetes.  We also discussed how sleep disruption can increase risks for accidents, such as while driving.  Weight loss as a means of improving sleep apnea was also reviewed.  Additional treatment options discussed were  CPAP therapy, oral appliance, and surgical intervention. - will arrange for in lab CPAP titration study, and then transition to Bipap as needed   Patient Instructions  Will arrange for in lab CPAP titration study and arrange for follow up after sleep study reviewed    Maurice Mires, MD Maurice Wood 03/07/2018, 2:57 PM Pager:  [phone]  Flow Sheet  Sleep tests: PSG 11/09/17 >> AHI 40.8, SpO2 79%  Cardiac tests: Echo 11/04/17 >> EF 60 to 65%   Review of Systems: Constitutional: Negative for fever and unexpected weight change.  HENT: Positive for dental problem, postnasal drip and sinus pressure. Negative for congestion, ear pain, nosebleeds, rhinorrhea, sneezing, sore throat and trouble swallowing.   Eyes: Negative for redness and itching.  Respiratory: Positive for shortness of breath. Negative for cough, chest tightness and wheezing.   Cardiovascular: Positive for palpitations and leg swelling.       A-fib   Gastrointestinal: Negative for nausea and vomiting.  Genitourinary: Negative for dysuria.  Musculoskeletal: Negative for joint swelling.  Skin: Negative for rash.  Allergic/Immunologic: Positive for environmental allergies. Negative for food allergies and immunocompromised state.  Neurological: Positive for headaches.  Hematological: Does not bruise/bleed easily.  Psychiatric/Behavioral: Negative for dysphoric mood. The patient is not nervous/anxious.    Past Medical History: He  has  a past medical history of Allergy, Atrial fibrillation [facility]) (Jan 2007), Benign prostatic hypertrophy, Cerebral aneurysm, Fatigue, History of chest pain, colonic polyps, Hyperlipidemia, IBS (irritable bowel syndrome), Incontinence, Kidney stones, Obesity, Obstructive sleep apnea, and Retinal vein occlusion.  Past Surgical History: He  has a past surgical history that includes Cholecystectomy; inguinal and umbilical herniorrhaphy; Tonsillectomy and adenoidectomy (as a  child); Cardiac catheterization (1999); Hernia repair; left heart catheterization with coronary angiogram (N/A, 06/01/2014); Lithotripsy; and Interstim Implant placement (04/2017).  Family History: His family history includes Alcohol abuse in his father; Aneurysm in his father; Cancer in his father and maternal grandmother; Diabetes in his maternal uncle, paternal uncle, and son; Heart disease in his paternal grandfather; Sudden death in his mother.  Social History: He  reports that he has quit smoking. His smoking use included cigarettes. He has never used smokeless tobacco. He reports that he drinks about 0.6 oz of alcohol per week. He reports that he does not use drugs.  Medications: Allergies as of 03/07/2018      Reactions   Clarithromycin    REACTION: Headache   Epinephrine    REACTION: Increased Heart Rate   Nimodipine    REACTION: Flushing      Medication List        Accurate as of 03/07/18  2:57 PM. Always use your most recent med list.          acetaminophen 650 MG CR tablet Commonly known as:  TYLENOL Take 1,300 mg by mouth every 8 (eight) hours as needed for pain.   apixaban 5 MG Tabs tablet Commonly known as:  ELIQUIS TAKE 1 TABLET (5 MG TOTAL) BY MOUTH 2 (TWO) TIMES DAILY.   cetirizine 10 MG tablet Commonly known as:  ZYRTEC Take 10 mg by mouth every morning.   colestipol 1 g tablet Commonly known as:  COLESTID   ipratropium 0.06 % nasal spray Commonly known as:  ATROVENT Place 1 spray into both nostrils 4 (four) times daily.   loratadine 10 MG tablet Commonly known as:  CLARITIN Take 10 mg by mouth daily as needed for allergies.   MYRBETRIQ 25 MG Tb24 tablet Generic drug:  mirabegron ER Take 25 mg by mouth daily.   ranitidine 150 MG capsule Commonly known as:  ZANTAC Take 150 mg by mouth daily.

## 2018-03-18 ENCOUNTER — Telehealth: Payer: Self-pay | Admitting: Neurology

## 2018-03-18 NOTE — Telephone Encounter (Signed)
Called [facility], spoke with Katie, determined #5. 88ml viles would be less expensive, ($36.35 for 5 vials vs $16.73 for 1) trx to pharmacist, Learta Codding, gave verbal for #5 vials. Called Pt, LMOVM

## 2018-03-18 NOTE — Telephone Encounter (Signed)
........*  STAT* If patient is at the pharmacy, call can be transferred to refill team.  1.     Which medications need to be refilled? (please list name of each medication and dose if know B-12 INJECTION    2.     Which pharmacy/location (including street and city if local pharmacy) is medication to be sent to? [facility]   3.     Do they need a 30 or 90 day supply? 30 OR 90  Patient wants to see if we can call in the B-12 in for either a 30 or 90 day supply. They are paying for it since the ins is not covering the medication. It maybe cheaper for them if we call it in for a 30 or 90 day supply

## 2018-03-21 ENCOUNTER — Telehealth: Payer: Self-pay | Admitting: Pulmonary Disease

## 2018-03-21 NOTE — Telephone Encounter (Signed)
Spoke to wife Josem Kaufmann is pending with [facility] [employer] we have faxed records to them if by 03/24/18 is has not been approved I will call them Joellen Jersey

## 2018-03-25 ENCOUNTER — Ambulatory Visit (HOSPITAL_BASED_OUTPATIENT_CLINIC_OR_DEPARTMENT_OTHER): Payer: PPO | Attending: Pulmonary Disease | Admitting: Pulmonary Disease

## 2018-03-25 VITALS — Ht 70.0 in | Wt 190.0 lb

## 2018-03-25 DIAGNOSIS — G4733 Obstructive sleep apnea (adult) (pediatric): Secondary | ICD-10-CM | POA: Insufficient documentation

## 2018-03-30 ENCOUNTER — Telehealth: Payer: Self-pay | Admitting: Pulmonary Disease

## 2018-03-30 DIAGNOSIS — G4733 Obstructive sleep apnea (adult) (pediatric): Secondary | ICD-10-CM

## 2018-03-30 NOTE — Telephone Encounter (Signed)
CPAP titration 03/25/18 >> CPAP 12 cm H2O >> AHI 0.   Please let him know he did well with CPAP during titration study.    Please arrange for CPAP 12 cm H2O with heated humidity and mask of choice.  Schedule ROV with me or NP 2 months after set up.

## 2018-03-30 NOTE — Procedures (Signed)
   Patient Name: Maurice Wood, Maurice Wood Date: 03/25/2018 Gender: Male D.O.B: December 18, 1932 Age (years): 41 Referring Provider: Chesley Mires MD, ABSM Height (inches): 70 Interpreting Physician: Chesley Mires MD, ABSM Weight (lbs): 190 RPSGT: Zadie Rhine BMI: 27 MRN: [id number] Neck Size: 16.00  CLINICAL INFORMATION The patient is referred for a CPAP titration to treat sleep apnea.  Date of NPSG, Split Night or HST: 11/09/17, AHI 40.8.  SLEEP STUDY TECHNIQUE As per the AASM Manual for the Scoring of Sleep and Associated Events v2.3 (April 2016) with a hypopnea requiring 4% desaturations.  The channels recorded and monitored were frontal, central and occipital EEG, electrooculogram (EOG), submentalis EMG (chin), nasal and oral airflow, thoracic and abdominal wall motion, anterior tibialis EMG, snore microphone, electrocardiogram, and pulse oximetry. Continuous positive airway pressure (CPAP) was initiated at the beginning of the study and titrated to treat sleep-disordered breathing.  MEDICATIONS Medications self-administered by patient taken the night of the study : N/A  TECHNICIAN COMMENTS Comments added by technician: NO RESTROOM VISTED. Patient had difficulty initiating sleep. Comments added by scorer: N/A  RESPIRATORY PARAMETERS Optimal PAP Pressure (cm): 12 AHI at Optimal Pressure (/hr): 0.0 Overall Minimal O2 (%): 85.0 Supine % at Optimal Pressure (%): 100 Minimal O2 at Optimal Pressure (%): 89.0   SLEEP ARCHITECTURE The study was initiated at 9:57:24 PM and ended at 4:45:27 AM.  Sleep onset time was 56.2 minutes and the sleep efficiency was 83.4%%. The total sleep time was 340.5 minutes.  The patient spent 1.6%% of the night in stage N1 sleep, 79.3%% in stage N2 sleep, 4.1%% in stage N3 and 14.98% in REM.Stage REM latency was 56.0 minutes  Wake after sleep onset was 11.4. Alpha intrusion was absent. Supine sleep was 100.00%.  CARDIAC DATA The 2 lead EKG demonstrated sinus  rhythm. The mean heart rate was 76.2 beats per minute. Other EKG findings include: PVCs.  LEG MOVEMENT DATA The total Periodic Limb Movements of Sleep (PLMS) were 0. The PLMS index was 0.0. A PLMS index of <15 is considered normal in adults.  IMPRESSIONS - He did well with CPAP 12 cm H2O.  He was observed in REM sleep at this pressure setting.  He did not require supplemental oxygen during this study.  DIAGNOSIS - Obstructive Sleep Apnea (327.23 [G47.33 ICD-10])  RECOMMENDATIONS - Trial of CPAP therapy on 12 cm H2O with a Medium size Fisher[employer]Paykel Full Face Mask Simplus mask and heated humidification.  [Electronically signed] 03/30/2018 08:10 PM  Chesley Mires MD, [place], American Board of Sleep Medicine NPI: [id number]

## 2018-03-31 NOTE — Telephone Encounter (Signed)
Called and spoke with pt letting him know the results of the cpap titration.  Pt expressed understanding. Order placed for pt to begin cpap and scheduled OV for pt to be seen in 2 months to follow up on cpap.  Nothing further needed at this time.

## 2018-04-04 ENCOUNTER — Encounter: Payer: Self-pay | Admitting: Neurology

## 2018-04-08 DIAGNOSIS — G4733 Obstructive sleep apnea (adult) (pediatric): Secondary | ICD-10-CM | POA: Diagnosis not present

## 2018-04-08 DIAGNOSIS — A419 Sepsis, unspecified organism: Secondary | ICD-10-CM | POA: Diagnosis not present

## 2018-04-09 DIAGNOSIS — A419 Sepsis, unspecified organism: Secondary | ICD-10-CM | POA: Diagnosis not present

## 2018-04-09 DIAGNOSIS — G4733 Obstructive sleep apnea (adult) (pediatric): Secondary | ICD-10-CM | POA: Diagnosis not present

## 2018-05-07 ENCOUNTER — Encounter: Payer: Self-pay | Admitting: Family Medicine

## 2018-05-10 DIAGNOSIS — G4733 Obstructive sleep apnea (adult) (pediatric): Secondary | ICD-10-CM | POA: Diagnosis not present

## 2018-05-10 DIAGNOSIS — A419 Sepsis, unspecified organism: Secondary | ICD-10-CM | POA: Diagnosis not present

## 2018-05-14 DIAGNOSIS — H52203 Unspecified astigmatism, bilateral: Secondary | ICD-10-CM | POA: Diagnosis not present

## 2018-05-14 DIAGNOSIS — H35352 Cystoid macular degeneration, left eye: Secondary | ICD-10-CM | POA: Diagnosis not present

## 2018-05-14 DIAGNOSIS — Z961 Presence of intraocular lens: Secondary | ICD-10-CM | POA: Diagnosis not present

## 2018-05-22 DIAGNOSIS — G4733 Obstructive sleep apnea (adult) (pediatric): Secondary | ICD-10-CM | POA: Diagnosis not present

## 2018-05-22 DIAGNOSIS — A419 Sepsis, unspecified organism: Secondary | ICD-10-CM | POA: Diagnosis not present

## 2018-05-29 DIAGNOSIS — N3941 Urge incontinence: Secondary | ICD-10-CM | POA: Diagnosis not present

## 2018-06-03 ENCOUNTER — Ambulatory Visit: Payer: PPO | Admitting: Adult Health

## 2018-06-04 ENCOUNTER — Telehealth: Payer: Self-pay | Admitting: Pulmonary Disease

## 2018-06-04 NOTE — Telephone Encounter (Signed)
Spoke with pt. He has an upcoming appointment with Dr. Halford Chessman on 06/10/18. Pt wanted to know if there was anything that he needed to bring with him. Advised him that he would need to bring his SD card from his CPAP machine. Pt verbalized understanding. Nothing further was needed at this time.

## 2018-06-09 ENCOUNTER — Encounter: Payer: Self-pay | Admitting: Neurology

## 2018-06-09 ENCOUNTER — Ambulatory Visit (INDEPENDENT_AMBULATORY_CARE_PROVIDER_SITE_OTHER): Payer: PPO | Admitting: Neurology

## 2018-06-09 VITALS — BP 112/70 | HR 88 | Ht 70.0 in | Wt 190.0 lb

## 2018-06-09 DIAGNOSIS — G609 Hereditary and idiopathic neuropathy, unspecified: Secondary | ICD-10-CM

## 2018-06-09 DIAGNOSIS — G4733 Obstructive sleep apnea (adult) (pediatric): Secondary | ICD-10-CM | POA: Diagnosis not present

## 2018-06-09 DIAGNOSIS — A419 Sepsis, unspecified organism: Secondary | ICD-10-CM | POA: Diagnosis not present
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
## 2018-06-09 NOTE — Patient Instructions (Signed)
1.  Continue B12 supplementation 2.  Use the cane to help maintain balance 3.  Contact me with any questions or concerns, otherwise follow up in 6 months

## 2018-06-10 ENCOUNTER — Encounter: Payer: Self-pay | Admitting: Primary Care

## 2018-06-10 ENCOUNTER — Ambulatory Visit: Payer: PPO | Admitting: Primary Care

## 2018-06-10 DIAGNOSIS — G4733 Obstructive sleep apnea (adult) (pediatric): Secondary | ICD-10-CM | POA: Diagnosis not present

## 2018-06-10 DIAGNOSIS — I482 Chronic atrial fibrillation: Secondary | ICD-10-CM | POA: Diagnosis not present

## 2018-06-10 DIAGNOSIS — I4821 Permanent atrial fibrillation: Secondary | ICD-10-CM

## 2018-06-10 NOTE — Patient Instructions (Addendum)
Compliance is excellent,your cpap report looks great. Keep using every night for goal of >4 hours. Follow up in 1 year and as needed

## 2018-06-10 NOTE — Assessment & Plan Note (Addendum)
CPAP pressure set at 12cm H2O. Compliance is excellent; 30/30 days with an average use of 8 hours 38 mins. AHI 0.8. Tiredness has improved. Follow-up in 1 year and as needed.

## 2018-06-10 NOTE — Progress Notes (Signed)
Reviewed and agree with assessment/plan.   Rainn Bullinger, MD [facility] 12/05/2016, 12:24 PM Pager:  [phone]  

## 2018-06-10 NOTE — Assessment & Plan Note (Signed)
Stable, rate controlled. Continues Eliquis.

## 2018-06-10 NOTE — Progress Notes (Signed)
_0  ID: Maurice Wood, male    DOB: 03/10/1933, 84 y.o.   MRN: [id number]  Chief Complaint  Patient presents with  . Follow-up    OSA    Referring provider: Antony Contras, MD  HPI: Hx OSA, seen by Dr. Halford Chessman in March 2019. Patient underwent split night sleep study, AHI at that time was 40. Cpap titrated in lab, optimal control set at 12cm H20 wearing full face mask.   06/10/2018 Comes in today for follow-up OSA visit, he is doing well. No complaints; denies sob/cp. Tiredness is improving. Occasional HA.   CPAP download showing compliance 30/30 days with an average use of 8 hours 38 mins. AHI 0.8.  Allergies  Allergen Reactions  . Clarithromycin     REACTION: Headache  . Epinephrine     REACTION: Increased Heart Rate  . Nimodipine     REACTION: Flushing    Immunization History  Administered Date(s) Administered  . H1N1 11/19/2008  . Influenza Split 11/30/2012  . Influenza Whole 09/09/2006, 09/11/2007, 09/14/2008, 09/06/2009, 08/13/2011, 08/10/2012  . Influenza,inj,Quad PF,6+ Mos 08/13/2013, 08/30/2014  . Influenza-Unspecified 09/09/2017  . Pneumococcal Polysaccharide-23 12/11/2003, 11/02/2009  . Td 09/05/1999, 07/31/2010  . Tdap 09/11/2016  . Zoster 09/09/2006    Past Medical History:  Diagnosis Date  . Allergy    rhinitis  . Atrial fibrillation [facility]) Jan 2007   echo 1-07 normal ejection fraction, no significant valvular disease. adenosine cardiolite 1-07  no evidence of ischemia. A 48 hour Holter monitor in 7-07 showed good rate controlw/ chronic atrial fibrillation. Cath 1/11 normal cors. EF 45%. Echo 2-11 60-65%  . Benign prostatic hypertrophy   . Cerebral aneurysm    followed by Dr Estanislado Pandy  . Fatigue   . History of chest pain    a. s/p LHC 05/2014 with normal cors  . Hx of colonic polyps    diverticulosis  . Hyperlipidemia   . IBS (irritable bowel syndrome)   . Incontinence    Per pt 12/14/11  . Kidney stones   . Obesity   . Obstructive sleep apnea      noncompliant with CPAP  . Retinal vein occlusion     Tobacco History: Social History   Tobacco Use  Smoking Status Former Smoker  . Packs/day: 1.00  . Years: 20.00  . Pack years: 20.00  . Types: Cigarettes  . Last attempt to quit: 12/11/1971  . Years since quitting: 46.5  Smokeless Tobacco Never Used   Counseling given: Not Answered   Outpatient Medications Prior to Visit  Medication Sig Dispense Refill  . acetaminophen (TYLENOL) 650 MG CR tablet Take 1,300 mg by mouth every 8 (eight) hours as needed for pain.    Marland Kitchen apixaban (ELIQUIS) 5 MG TABS tablet TAKE 1 TABLET (5 MG TOTAL) BY MOUTH 2 (TWO) TIMES DAILY. 180 tablet 2  . cetirizine (ZYRTEC) 10 MG tablet Take 10 mg by mouth every morning.     . colestipol (COLESTID) 1 g tablet     . Cyanocobalamin (B-12 COMPLIANCE INJECTION) 1000 MCG/ML KIT Inject as directed every 30 (thirty) days.    Marland Kitchen ipratropium (ATROVENT) 0.06 % nasal spray Place 1 spray into both nostrils 4 (four) times daily.     Marland Kitchen loratadine (CLARITIN) 10 MG tablet Take 10 mg by mouth daily as needed for allergies.    . mirabegron ER (MYRBETRIQ) 25 MG TB24 tablet Take 25 mg by mouth daily.    . ranitidine (ZANTAC) 150 MG capsule Take 150 mg by mouth  daily.     No facility-administered medications prior to visit.       Review of Systems  Review of Systems  Constitutional: Positive for fatigue.  HENT: Negative.  Negative for postnasal drip.   Respiratory: Negative.  Negative for shortness of breath.   Cardiovascular: Negative.   Gastrointestinal: Negative.   Genitourinary: Negative.   Skin: Negative.   Neurological: Negative.   Psychiatric/Behavioral: Negative.      Physical Exam  BP 112/66 (BP Location: Left Arm, Cuff Size: Normal)   Pulse 78   Ht 5' 10" (1.778 m)   Wt 191 lb 3.2 oz (86.7 kg)   SpO2 94%   BMI 27.43 kg/m  Physical Exam  Constitutional: He is oriented to person, place, and time. He appears well-developed and well-nourished.  HENT:   Head: Normocephalic and atraumatic.  Mellampati score, Class I-II  Eyes: Pupils are equal, round, and reactive to light. EOM are normal.  Neck: Normal range of motion. Neck supple.  Cardiovascular: Normal rate, normal heart sounds, intact distal pulses and normal pulses. An irregular rhythm present.  Pulmonary/Chest: Effort normal and breath sounds normal. No respiratory distress. He has no wheezes.  Abdominal: Soft. There is no tenderness.  Neurological: He is alert and oriented to person, place, and time.  Skin: Skin is warm and dry. No rash noted. No erythema.  Psychiatric: He has a normal mood and affect. His behavior is normal. Judgment normal.     Lab Results:  Imaging: No results found.   Assessment & Plan:   OBSTRUCTIVE SLEEP APNEA CPAP pressure set at 12cm H2O. Compliance is excellent; 30/30 days with an average use of 8 hours 38 mins. AHI 0.8. Tiredness has improved. Follow-up in 1 year and as needed.   Atrial fibrillation, permanent Stable, rate controlled. Continues Eliquis.     Martyn Ehrich, NP 06/10/2018

## 2018-06-13 DIAGNOSIS — H35352 Cystoid macular degeneration, left eye: Secondary | ICD-10-CM | POA: Diagnosis not present

## 2018-06-13 DIAGNOSIS — H30031 Focal chorioretinal inflammation, peripheral, right eye: Secondary | ICD-10-CM | POA: Diagnosis not present

## 2018-06-13 DIAGNOSIS — Z961 Presence of intraocular lens: Secondary | ICD-10-CM | POA: Diagnosis not present

## 2018-06-13 DIAGNOSIS — H43813 Vitreous degeneration, bilateral: Secondary | ICD-10-CM | POA: Diagnosis not present

## 2018-06-13 DIAGNOSIS — H34832 Tributary (branch) retinal vein occlusion, left eye, with macular edema: Secondary | ICD-10-CM | POA: Diagnosis not present

## 2018-06-13 DIAGNOSIS — H35372 Puckering of macula, left eye: Secondary | ICD-10-CM | POA: Diagnosis not present

## 2018-06-15 DIAGNOSIS — E860 Dehydration: Secondary | ICD-10-CM | POA: Diagnosis not present

## 2018-06-15 DIAGNOSIS — R350 Frequency of micturition: Secondary | ICD-10-CM | POA: Diagnosis not present

## 2018-06-19 DIAGNOSIS — G9009 Other idiopathic peripheral autonomic neuropathy: Secondary | ICD-10-CM | POA: Diagnosis not present

## 2018-06-23 DIAGNOSIS — H903 Sensorineural hearing loss, bilateral: Secondary | ICD-10-CM | POA: Diagnosis not present

## 2018-06-24 DIAGNOSIS — Z0000 Encounter for general adult medical examination without abnormal findings: Secondary | ICD-10-CM | POA: Diagnosis not present

## 2018-06-24 DIAGNOSIS — H9191 Unspecified hearing loss, right ear: Secondary | ICD-10-CM | POA: Diagnosis not present

## 2018-06-24 DIAGNOSIS — E781 Pure hyperglyceridemia: Secondary | ICD-10-CM | POA: Diagnosis not present

## 2018-06-24 DIAGNOSIS — N3289 Other specified disorders of bladder: Secondary | ICD-10-CM | POA: Diagnosis not present

## 2018-06-24 DIAGNOSIS — R197 Diarrhea, unspecified: Secondary | ICD-10-CM | POA: Diagnosis not present

## 2018-06-24 DIAGNOSIS — N3281 Overactive bladder: Secondary | ICD-10-CM | POA: Diagnosis not present

## 2018-06-24 DIAGNOSIS — I482 Chronic atrial fibrillation: Secondary | ICD-10-CM | POA: Diagnosis not present

## 2018-06-24 DIAGNOSIS — R7309 Other abnormal glucose: Secondary | ICD-10-CM | POA: Diagnosis not present

## 2018-06-24 DIAGNOSIS — G6289 Other specified polyneuropathies: Secondary | ICD-10-CM | POA: Diagnosis not present

## 2018-06-24 DIAGNOSIS — G4730 Sleep apnea, unspecified: Secondary | ICD-10-CM | POA: Diagnosis not present

## 2018-06-24 DIAGNOSIS — H349 Unspecified retinal vascular occlusion: Secondary | ICD-10-CM | POA: Diagnosis not present

## 2018-06-24 DIAGNOSIS — Z23 Encounter for immunization: Secondary | ICD-10-CM | POA: Diagnosis not present

## 2018-07-01 DIAGNOSIS — H30031 Focal chorioretinal inflammation, peripheral, right eye: Secondary | ICD-10-CM | POA: Diagnosis not present

## 2018-07-01 DIAGNOSIS — Z961 Presence of intraocular lens: Secondary | ICD-10-CM | POA: Diagnosis not present

## 2018-07-01 DIAGNOSIS — H43813 Vitreous degeneration, bilateral: Secondary | ICD-10-CM | POA: Diagnosis not present

## 2018-07-01 DIAGNOSIS — H35352 Cystoid macular degeneration, left eye: Secondary | ICD-10-CM | POA: Diagnosis not present

## 2018-07-01 DIAGNOSIS — H35372 Puckering of macula, left eye: Secondary | ICD-10-CM | POA: Diagnosis not present

## 2018-07-01 DIAGNOSIS — H34832 Tributary (branch) retinal vein occlusion, left eye, with macular edema: Secondary | ICD-10-CM | POA: Diagnosis not present

## 2018-07-09 DIAGNOSIS — S70361A Insect bite (nonvenomous), right thigh, initial encounter: Secondary | ICD-10-CM | POA: Diagnosis not present

## 2018-07-10 DIAGNOSIS — G4733 Obstructive sleep apnea (adult) (pediatric): Secondary | ICD-10-CM | POA: Diagnosis not present

## 2018-07-10 DIAGNOSIS — A419 Sepsis, unspecified organism: Secondary | ICD-10-CM | POA: Diagnosis not present

## 2018-07-31 DIAGNOSIS — A419 Sepsis, unspecified organism: Secondary | ICD-10-CM | POA: Diagnosis not present

## 2018-07-31 DIAGNOSIS — G4733 Obstructive sleep apnea (adult) (pediatric): Secondary | ICD-10-CM | POA: Diagnosis not present

## 2018-08-10 DIAGNOSIS — G4733 Obstructive sleep apnea (adult) (pediatric): Secondary | ICD-10-CM | POA: Diagnosis not present

## 2018-08-10 DIAGNOSIS — A419 Sepsis, unspecified organism: Secondary | ICD-10-CM | POA: Diagnosis not present

## 2018-08-12 DIAGNOSIS — H348322 Tributary (branch) retinal vein occlusion, left eye, stable: Secondary | ICD-10-CM | POA: Diagnosis not present

## 2018-08-12 DIAGNOSIS — H35372 Puckering of macula, left eye: Secondary | ICD-10-CM | POA: Diagnosis not present

## 2018-08-12 DIAGNOSIS — Z961 Presence of intraocular lens: Secondary | ICD-10-CM | POA: Diagnosis not present

## 2018-08-12 DIAGNOSIS — H35342 Macular cyst, hole, or pseudohole, left eye: Secondary | ICD-10-CM | POA: Diagnosis not present

## 2018-08-12 DIAGNOSIS — H30031 Focal chorioretinal inflammation, peripheral, right eye: Secondary | ICD-10-CM | POA: Diagnosis not present

## 2018-08-12 DIAGNOSIS — H43813 Vitreous degeneration, bilateral: Secondary | ICD-10-CM | POA: Diagnosis not present

## 2018-08-13 DIAGNOSIS — M25511 Pain in right shoulder: Secondary | ICD-10-CM | POA: Insufficient documentation

## 2018-08-20 ENCOUNTER — Encounter: Payer: Self-pay | Admitting: Podiatry

## 2018-08-20 ENCOUNTER — Ambulatory Visit: Payer: PPO | Admitting: Podiatry

## 2018-08-20 DIAGNOSIS — M79675 Pain in left toe(s): Secondary | ICD-10-CM | POA: Diagnosis not present

## 2018-08-20 DIAGNOSIS — M79674 Pain in right toe(s): Secondary | ICD-10-CM

## 2018-08-20 DIAGNOSIS — G629 Polyneuropathy, unspecified: Secondary | ICD-10-CM | POA: Diagnosis not present

## 2018-08-20 DIAGNOSIS — B351 Tinea unguium: Secondary | ICD-10-CM | POA: Diagnosis not present

## 2018-08-20 NOTE — Progress Notes (Signed)
Complaint:  Visit Type: Patient presents  to my office for  preventative foot care services. Complaint: Patient states" my nails have grown long and thick and become painful to walk and wear shoes" Patient has been diagnosed with neuropathy. The patient presents for preventative foot care services. No changes to ROS  Podiatric Exam: Vascular: dorsalis pedis and posterior tibial pulses are not  palpable bilateral. Capillary return is diminished . Temperature gradient is WNL. Skin turgor WNL  Sensorium: Absent  Semmes Weinstein monofilament test. Normal tactile sensation bilaterally. Nail Exam: Pt has thick disfigured discolored nails with subungual debris noted bilateral entire nail hallux B/L Ulcer Exam: There is no evidence of ulcer or pre-ulcerative changes or infection. Orthopedic Exam: Muscle tone and strength are WNL. No limitations in general ROM. No crepitus or effusions noted. Foot type and digits show no abnormalities. Bony prominences are unremarkable. Skin: No Porokeratosis. No infection or ulcers  Diagnosis:  Onychomycosis, , Pain in right toe, pain in left toes  Treatment & Plan Procedures and Treatment: Consent by patient was obtained for treatment procedures.   Debridement of mycotic and hypertrophic toenails, 1 through 5 bilateral and clearing of subungual debris. No ulceration, no infection noted.  Return Visit-Office Procedure: Patient instructed to return to the office for a follow up visit 3 months for continued evaluation and treatment.    Gardiner Barefoot DPM

## 2018-09-02 DIAGNOSIS — R159 Full incontinence of feces: Secondary | ICD-10-CM | POA: Diagnosis not present

## 2018-09-02 DIAGNOSIS — N3941 Urge incontinence: Secondary | ICD-10-CM | POA: Diagnosis not present

## 2018-09-09 DIAGNOSIS — A419 Sepsis, unspecified organism: Secondary | ICD-10-CM | POA: Diagnosis not present

## 2018-09-09 DIAGNOSIS — G4733 Obstructive sleep apnea (adult) (pediatric): Secondary | ICD-10-CM | POA: Diagnosis not present

## 2018-09-10 DIAGNOSIS — R110 Nausea: Secondary | ICD-10-CM | POA: Diagnosis not present

## 2018-09-10 DIAGNOSIS — K219 Gastro-esophageal reflux disease without esophagitis: Secondary | ICD-10-CM | POA: Diagnosis not present

## 2018-09-16 DIAGNOSIS — H43813 Vitreous degeneration, bilateral: Secondary | ICD-10-CM | POA: Diagnosis not present

## 2018-09-16 DIAGNOSIS — H348322 Tributary (branch) retinal vein occlusion, left eye, stable: Secondary | ICD-10-CM | POA: Diagnosis not present

## 2018-09-16 DIAGNOSIS — Z79899 Other long term (current) drug therapy: Secondary | ICD-10-CM | POA: Diagnosis not present

## 2018-09-16 DIAGNOSIS — H30031 Focal chorioretinal inflammation, peripheral, right eye: Secondary | ICD-10-CM | POA: Diagnosis not present

## 2018-09-16 DIAGNOSIS — H35342 Macular cyst, hole, or pseudohole, left eye: Secondary | ICD-10-CM | POA: Diagnosis not present

## 2018-09-16 DIAGNOSIS — H35372 Puckering of macula, left eye: Secondary | ICD-10-CM | POA: Diagnosis not present

## 2018-09-16 DIAGNOSIS — Z961 Presence of intraocular lens: Secondary | ICD-10-CM | POA: Diagnosis not present

## 2018-10-01 ENCOUNTER — Other Ambulatory Visit: Payer: Self-pay | Admitting: Physician Assistant

## 2018-10-01 DIAGNOSIS — R110 Nausea: Secondary | ICD-10-CM

## 2018-10-01 DIAGNOSIS — K219 Gastro-esophageal reflux disease without esophagitis: Secondary | ICD-10-CM | POA: Diagnosis not present

## 2018-10-02 ENCOUNTER — Ambulatory Visit
Admission: RE | Admit: 2018-10-02 | Discharge: 2018-10-02 | Disposition: A | Discharge status: Home / Self Care | Payer: PPO | Source: Ambulatory Visit | Attending: Physician Assistant | Admitting: Physician Assistant

## 2018-10-02 DIAGNOSIS — K219 Gastro-esophageal reflux disease without esophagitis: Secondary | ICD-10-CM

## 2018-10-02 DIAGNOSIS — K449 Diaphragmatic hernia without obstruction or gangrene: Secondary | ICD-10-CM | POA: Diagnosis not present

## 2018-10-02 DIAGNOSIS — R110 Nausea: Secondary | ICD-10-CM

## 2018-10-10 DIAGNOSIS — A419 Sepsis, unspecified organism: Secondary | ICD-10-CM | POA: Diagnosis not present

## 2018-10-10 DIAGNOSIS — G4733 Obstructive sleep apnea (adult) (pediatric): Secondary | ICD-10-CM | POA: Diagnosis not present

## 2018-10-22 ENCOUNTER — Emergency Department (HOSPITAL_COMMUNITY): Payer: PPO

## 2018-10-22 ENCOUNTER — Encounter (HOSPITAL_COMMUNITY): Payer: Self-pay

## 2018-10-22 ENCOUNTER — Emergency Department (HOSPITAL_COMMUNITY)
Admission: EM | Admit: 2018-10-22 | Discharge: 2018-10-22 | Disposition: A | Discharge status: Home / Self Care | Payer: PPO | Attending: Emergency Medicine | Admitting: Emergency Medicine

## 2018-10-22 DIAGNOSIS — S0081XA Abrasion of other part of head, initial encounter: Secondary | ICD-10-CM | POA: Insufficient documentation

## 2018-10-22 DIAGNOSIS — Z23 Encounter for immunization: Secondary | ICD-10-CM | POA: Diagnosis not present

## 2018-10-22 DIAGNOSIS — S199XXA Unspecified injury of neck, initial encounter: Secondary | ICD-10-CM | POA: Diagnosis not present

## 2018-10-22 DIAGNOSIS — Z87891 Personal history of nicotine dependence: Secondary | ICD-10-CM | POA: Insufficient documentation

## 2018-10-22 DIAGNOSIS — W01198A Fall on same level from slipping, tripping and stumbling with subsequent striking against other object, initial encounter: Secondary | ICD-10-CM | POA: Insufficient documentation

## 2018-10-22 DIAGNOSIS — M7989 Other specified soft tissue disorders: Secondary | ICD-10-CM | POA: Diagnosis not present

## 2018-10-22 DIAGNOSIS — S0990XA Unspecified injury of head, initial encounter: Secondary | ICD-10-CM

## 2018-10-22 DIAGNOSIS — Z7901 Long term (current) use of anticoagulants: Secondary | ICD-10-CM | POA: Diagnosis not present

## 2018-10-22 DIAGNOSIS — I4891 Unspecified atrial fibrillation: Secondary | ICD-10-CM | POA: Diagnosis not present

## 2018-10-22 DIAGNOSIS — Y999 Unspecified external cause status: Secondary | ICD-10-CM | POA: Insufficient documentation

## 2018-10-22 DIAGNOSIS — S0003XA Contusion of scalp, initial encounter: Secondary | ICD-10-CM | POA: Diagnosis not present

## 2018-10-22 DIAGNOSIS — E119 Type 2 diabetes mellitus without complications: Secondary | ICD-10-CM | POA: Insufficient documentation

## 2018-10-22 DIAGNOSIS — S0121XA Laceration without foreign body of nose, initial encounter: Secondary | ICD-10-CM | POA: Diagnosis not present

## 2018-10-22 DIAGNOSIS — S8992XA Unspecified injury of left lower leg, initial encounter: Secondary | ICD-10-CM | POA: Diagnosis not present

## 2018-10-22 DIAGNOSIS — Y9301 Activity, walking, marching and hiking: Secondary | ICD-10-CM | POA: Diagnosis not present

## 2018-10-22 DIAGNOSIS — Y92008 Other place in unspecified non-institutional (private) residence as the place of occurrence of the external cause: Secondary | ICD-10-CM | POA: Diagnosis not present

## 2018-10-22 DIAGNOSIS — W19XXXA Unspecified fall, initial encounter: Secondary | ICD-10-CM

## 2018-10-22 DIAGNOSIS — Z79899 Other long term (current) drug therapy: Secondary | ICD-10-CM | POA: Insufficient documentation

## 2018-10-22 DIAGNOSIS — S80211A Abrasion, right knee, initial encounter: Secondary | ICD-10-CM | POA: Diagnosis not present

## 2018-10-22 DIAGNOSIS — S0993XA Unspecified injury of face, initial encounter: Secondary | ICD-10-CM | POA: Diagnosis not present

## 2018-10-22 DIAGNOSIS — T07XXXA Unspecified multiple injuries, initial encounter: Secondary | ICD-10-CM

## 2018-10-22 MED ORDER — TETANUS-DIPHTH-ACELL PERTUSSIS 5-2.5-18.5 LF-MCG/0.5 IM SUSP
0.5000 mL | Freq: Once | INTRAMUSCULAR | Status: AC
  Administered 2018-10-22: 0.5 mL via INTRAMUSCULAR
  Filled 2018-10-22: qty 0.5

## 2018-10-22 MED ORDER — LIDOCAINE HCL (PF) 1 % IJ SOLN
5.0000 mL | Freq: Once | INTRAMUSCULAR | Status: AC
  Administered 2018-10-22: 5 mL
  Filled 2018-10-22: qty 5

## 2018-10-22 NOTE — Discharge Instructions (Signed)
Imaging is very reassuring does not show any evidence of bleeding in the brain, no fractures in the face or cervical spine, and no fracture to the knee.  Keep abrasions covered with antibiotic ointment and a dressing, you have dissolvable sutures present in your nose, monitor these areas for signs of infection such as redness, swelling, increasing pain or drainage, or any fevers or chills, if this occurs return to the emergency department or follow-up with your primary care doctor.  You were examined today for a head injury and possible concussion.  Sometimes serious problems can develop after a head injury. Please return to the emergency department if you experience any of the following symptoms: Repeated vomiting Headache that gets worse and does not go away Loss of consciousness or inability to stay awake at times when you normally would be able to Getting more confused, restless or agitated Convulsions or seizures Difficulty walking or feeling off balance Weakness or numbness Vision changes

## 2018-10-22 NOTE — ED Provider Notes (Signed)
[facility] Provider Note   CSN: [id number] Arrival date & time: 10/22/18  1236     History   Chief Complaint Chief Complaint  Patient presents with  . Fall    HPI Maurice Wood is a 84 y.o. male.  Maurice Wood is a 84 y.o. Male with a history of A. fib, hyperlipidemia, sleep apnea, cerebral aneurysm, and BPH, who presents to the emergency [facility] after a witnessed mechanical fall today.  Patient reports he was walking in the driveway when his feet got tangled up and he tripped falling forward.  He hit his forehead on the concrete driveway and his glasses smashed into his nose causing a laceration to the bridge of the nose.  He also scraped both of his knees and is complaining of pain with range of motion over the left knee.  He did not have a loss of consciousness but reports he felt very stunned afterwards had a little bit of a headache, felt a bit woozy, dizzy and had some blurred vision that quickly resolved.  He is now alert and oriented and feels like his normal self and wife reports he is acting at baseline.  He denies any pain in the chest, shortness of breath or abdominal pain.  Denies any neck or back pain.  Aside from small abrasions to both knees, and pain in the left knee he denies any other pain over his joints or extremities.  Is on Eliquis for A. fib.  Unsure of last tetanus vaccination, nothing for pain prior to arrival, no other aggravating or alleviating factors.     Past Medical History:  Diagnosis Date  . Allergy    rhinitis  . Atrial fibrillation [facility]) Jan 2007   echo 1-07 normal ejection fraction, no significant valvular disease. adenosine cardiolite 1-07  no evidence of ischemia. A 48 hour Holter monitor in 7-07 showed good rate controlw/ chronic atrial fibrillation. Cath 1/11 normal cors. EF 45%. Echo 2-11 60-65%  . Benign prostatic hypertrophy   . Cerebral aneurysm    followed by Dr Estanislado Pandy  . Fatigue   . History  of chest pain    a. s/p LHC 05/2014 with normal cors  . Hx of colonic polyps    diverticulosis  . Hyperlipidemia   . IBS (irritable bowel syndrome)   . Incontinence    Per pt 12/14/11  . Kidney stones   . Obesity   . Obstructive sleep apnea    noncompliant with CPAP  . Retinal vein occlusion     Patient Active Problem List   Diagnosis Date Noted  . Bilateral lower extremity edema 06/02/2015  . UTI (lower urinary tract infection) 05/25/2015  . Sepsis ([place]) 05/25/2015  . Fever 05/25/2015  . Cough 05/25/2015  . Kidney stone 05/25/2015  . Hypokalemia 05/25/2015  . Acute urinary retention   . H/O cardiac catheterizationn 12/2009 with normal coronary arteries 05/30/2014  . Low back pain 12/17/2013  . Chest pain 09/10/2013  . Urinary incontinence 06/04/2011  . Long term current use of anticoagulant 01/12/2011  . DM type 2 (diabetes mellitus, type 2) ([place]) 07/31/2010  . HEMORRHOIDS-INTERNAL 06/27/2010  . Sleep apnea 06/27/2010  . DIVERTICULOSIS OF COLON 01/03/2010  . GERD 11/22/2009  . IRRITABLE BOWEL SYNDROME 11/22/2009  . ERECTILE DYSFUNCTION 06/06/2009  . BPH (benign prostatic hyperplasia) 06/18/2008  . PSA, INCREASED 12/19/2007  . OBSTRUCTIVE SLEEP APNEA 07/24/2007  . DISSECTION, CAROTID ARTERY 07/24/2007  . TESTOSTERONE DEFICIENCY 07/23/2007  . HLD (  hyperlipidemia) 07/23/2007  . COLONIC POLYPS, HX OF 07/23/2007  . Atrial fibrillation, permanent 07/14/2007    Past Surgical History:  Procedure Laterality Date  . CARDIAC CATHETERIZATION  1999  . CHOLECYSTECTOMY    . HERNIA REPAIR    . inguinal and umbilical herniorrhaphy    . INTERSTIM IMPLANT PLACEMENT  04/2017  . LEFT HEART CATHETERIZATION WITH CORONARY ANGIOGRAM N/A 06/01/2014   Procedure: LEFT HEART CATHETERIZATION WITH CORONARY ANGIOGRAM;  Surgeon: Peter M Martinique, MD;  Location: [facility] CATH LAB;  Service: Cardiovascular;  Laterality: N/A;  . LITHOTRIPSY    . TONSILLECTOMY AND ADENOIDECTOMY  as a child        Home  Medications    Prior to Admission medications   Medication Sig Start Date End Date Taking? Authorizing Provider  acetaminophen (TYLENOL) 650 MG CR tablet Take 1,300 mg by mouth every 8 (eight) hours as needed for pain.    [provider]  apixaban (ELIQUIS) 5 MG TABS tablet TAKE 1 TABLET (5 MG TOTAL) BY MOUTH 2 (TWO) TIMES DAILY. 01/13/18   [name], Shaune Pascal, MD  cetirizine (ZYRTEC) 10 MG tablet Take 10 mg by mouth every morning.     [provider]  colestipol (COLESTID) 1 g tablet  02/17/18   [provider]  Cyanocobalamin (B-12 COMPLIANCE INJECTION) 1000 MCG/ML KIT Inject as directed every 30 (thirty) days.    [provider]  ipratropium (ATROVENT) 0.06 % nasal spray Place 1 spray into both nostrils 4 (four) times daily.  10/02/17   [provider]  loratadine (CLARITIN) 10 MG tablet Take 10 mg by mouth daily as needed for allergies.    [provider]  mirabegron ER (MYRBETRIQ) 25 MG TB24 tablet Take 25 mg by mouth daily.    [provider]  ranitidine (ZANTAC) 150 MG capsule Take 150 mg by mouth daily.    [provider]    Family History Family History  Problem Relation Age of Onset  . Cancer Father        lung  . Alcohol abuse Father   . Aneurysm Father        Aortic aneurysm  . Cancer Maternal Grandmother        colon  . Sudden death Mother   . Heart disease Paternal Grandfather   . Diabetes Son   . Diabetes Maternal Uncle   . Diabetes Paternal Uncle     Social History Social History   Tobacco Use  . Smoking status: Former Smoker    Packs/day: 1.00    Years: 20.00    Pack years: 20.00    Types: Cigarettes    Last attempt to quit: 12/11/1971    Years since quitting: 46.8  . Smokeless tobacco: Never Used  Substance Use Topics  . Alcohol use: Yes    Alcohol/week: 1.0 standard drinks    Types: 1 Cans of beer per week    Comment: Twice a month  . Drug use: No     Allergies   Epinephrine;  Nsaids; Clarithromycin; Nimodipine; and Other   Review of Systems Review of Systems  Constitutional: Negative for chills and fever.  HENT: Negative for facial swelling and nosebleeds.   Eyes: Negative for visual disturbance.  Respiratory: Negative for cough and shortness of breath.   Cardiovascular: Negative for chest pain.  Gastrointestinal: Negative for abdominal pain, nausea and vomiting.  Genitourinary: Negative for dysuria and flank pain.  Musculoskeletal: Positive for arthralgias and joint swelling.  Skin: Positive for wound.  Neurological: Positive for headaches. Negative for dizziness, facial asymmetry, speech difficulty, weakness, light-headedness and numbness.     Physical Exam Updated Vital Signs BP (!) 160/94 (BP Location: Right Arm)   Pulse 85   Temp 97.8 F (36.6 C) (Oral)   Resp 20   SpO2 96%   Physical Exam  Constitutional: He is oriented to person, place, and time. He appears well-developed and well-nourished. No distress.  HENT:  Head: Normocephalic.  Abrasion to the forehead with no deeper laceration, there is a surrounding hematoma, no hematoma over the scalp and no palpable step-off or deformity.  Negative battle sign, no CSF otorrhea or hemotympanum.  There is also a small laceration to the bridge of the nose with small amount of active bleeding, no obvious bony deformity surrounding this, no intranasal bleeding or septal hematoma No facial bony tenderness elsewhere, no malocclusion of the jaw, teeth are nontender, not loose to palpation  Eyes: Pupils are equal, round, and reactive to light. EOM are normal. Right eye exhibits no discharge. Left eye exhibits no discharge.  Neck: Neck supple.  Cardiovascular: Normal rate, regular rhythm, normal heart sounds and intact distal pulses. Exam reveals no gallop and no friction rub.  No murmur heard. Pulmonary/Chest: Effort normal and breath sounds normal. No respiratory distress. He exhibits no tenderness.    Respirations equal and unlabored, patient able to speak in full sentences, lungs clear to auscultation bilaterally, nontender to palpation, no crepitus or palpable deformity  Abdominal: Soft. Bowel sounds are normal. He exhibits no distension and no mass. There is no tenderness. There is no guarding.  Abdomen soft, nondistended, nontender to palpation in all quadrants without guarding or peritoneal signs  Musculoskeletal:  Bilateral knees with abrasions, abrasion is much larger over the left knee and there is some bony tenderness without appreciable deformity, range of motion intact with some discomfort.  Right knee with small abrasion, no bony tenderness or swelling and no issues with flexion or extension.  Bilateral DP and PT pulses are 2+, normal sensation.  Neurological: He is alert and oriented to person, place, and time. Coordination normal.  Speech is clear, able to follow commands CN III-XII intact Normal strength in upper and lower extremities bilaterally including dorsiflexion and plantar flexion, strong and equal grip strength Sensation normal to light and sharp touch Moves extremities without ataxia, coordination intact Normal finger to nose and rapid alternating movements No pronator drift  Skin: Skin is warm and dry. Capillary refill takes less than 2 seconds. He is not diaphoretic.  Psychiatric: He has a normal mood and affect. His behavior is normal.  Nursing note and vitals reviewed.    ED Treatments / Results  Labs (all labs ordered are listed, but only abnormal results are displayed) Labs Reviewed - No data to display  EKG EKG Interpretation  Date/Time:  Wednesday October 22 2018 12:45:59 EST Ventricular Rate:  90 PR Interval:    QRS Duration: 100 QT Interval:  370 QTC Calculation: 446 R Axis:   -29 Text Interpretation:  Atrial fibrillation Ventricular premature complex Inferior infarct, age indeterminate Consider anterior infarct Baseline wander in lead(s) V3  Confirmed by Orrin Brigham [id number]) on 10/22/2018 12:52:01 PM   Radiology Ct Head Wo Contrast  Result Date: 10/22/2018 CLINICAL DATA:  Fall today with abrasions and lacerations on the face. On Eliquis for atrial fibrillation. Initial encounter. EXAM: CT HEAD WITHOUT CONTRAST CT MAXILLOFACIAL WITHOUT CONTRAST CT CERVICAL SPINE WITHOUT CONTRAST TECHNIQUE: Multidetector CT imaging of the head, cervical spine, and  maxillofacial structures were performed using the standard protocol without intravenous contrast. Multiplanar CT image reconstructions of the cervical spine and maxillofacial structures were also generated. COMPARISON:  Head, maxillofacial, and cervical spine CTs 12/19/2017 FINDINGS: CT HEAD FINDINGS Brain: There is no evidence of acute infarct, intracranial hemorrhage, mass, midline shift, or extra-axial fluid collection. Chronic infarcts in the left cerebellum are unchanged. Patchy cerebral white matter hypodensities are unchanged and nonspecific but compatible with mild-to-moderate chronic small vessel ischemic disease. There is mild-to-moderate cerebral atrophy. Vascular: Calcified atherosclerosis at the skull base. No hyperdense vessel. Skull: No fracture or focal osseous lesion. Other: Moderate-sized frontal scalp hematoma centered just left of midline. CT MAXILLOFACIAL FINDINGS Osseous: No acute fracture or destructive osseous process. Remote anterior nasal spine fracture. Moderate bilateral TMJ arthrosis without dislocation. Orbits: No acute traumatic finding. Bilateral cataract extraction. Sinuses: Paranasal sinuses and mastoid air cells are clear. Soft tissues: No acute finding decides above described frontal scalp hematoma. CT CERVICAL SPINE FINDINGS Alignment: Chronic mild reversal of the normal cervical lordosis and minimal anterolisthesis of, C3 on C4, C4 on C5, and C7 on T1. Mild left convex curvature of the lower cervical spine. Skull base and vertebrae: No acute fracture or suspicious  osseous lesion. Atlantodental arthropathy with prominent spurring. Soft tissues and spinal canal: No prevertebral fluid or swelling. No visible canal hematoma. Disc levels: Advanced disc degeneration at C5-6 and C6-7 with disc space narrowing and bulky osteophyte formation resulting in severe left-sided neural foraminal stenosis at C6-7 greater than C5-6. Severe multilevel facet arthrosis. Upper chest: Clear lung apices. Other: Calcific atherosclerosis at the left greater than right carotid bifurcations. IMPRESSION: 1. No evidence of acute intracranial abnormality. 2. Frontal scalp hematoma. 3. Mild-to-moderate chronic small vessel ischemic disease, cerebral atrophy, and chronic left cerebellar infarcts. 4. No acute maxillofacial or cervical spine fracture. Electronically Signed   By: Logan Bores M.D.   On: 10/22/2018 14:56   Ct Cervical Spine Wo Contrast  Result Date: 10/22/2018 CLINICAL DATA:  Fall today with abrasions and lacerations on the face. On Eliquis for atrial fibrillation. Initial encounter. EXAM: CT HEAD WITHOUT CONTRAST CT MAXILLOFACIAL WITHOUT CONTRAST CT CERVICAL SPINE WITHOUT CONTRAST TECHNIQUE: Multidetector CT imaging of the head, cervical spine, and maxillofacial structures were performed using the standard protocol without intravenous contrast. Multiplanar CT image reconstructions of the cervical spine and maxillofacial structures were also generated. COMPARISON:  Head, maxillofacial, and cervical spine CTs 12/19/2017 FINDINGS: CT HEAD FINDINGS Brain: There is no evidence of acute infarct, intracranial hemorrhage, mass, midline shift, or extra-axial fluid collection. Chronic infarcts in the left cerebellum are unchanged. Patchy cerebral white matter hypodensities are unchanged and nonspecific but compatible with mild-to-moderate chronic small vessel ischemic disease. There is mild-to-moderate cerebral atrophy. Vascular: Calcified atherosclerosis at the skull base. No hyperdense vessel.  Skull: No fracture or focal osseous lesion. Other: Moderate-sized frontal scalp hematoma centered just left of midline. CT MAXILLOFACIAL FINDINGS Osseous: No acute fracture or destructive osseous process. Remote anterior nasal spine fracture. Moderate bilateral TMJ arthrosis without dislocation. Orbits: No acute traumatic finding. Bilateral cataract extraction. Sinuses: Paranasal sinuses and mastoid air cells are clear. Soft tissues: No acute finding decides above described frontal scalp hematoma. CT CERVICAL SPINE FINDINGS Alignment: Chronic mild reversal of the normal cervical lordosis and minimal anterolisthesis of, C3 on C4, C4 on C5, and C7 on T1. Mild left convex curvature of the lower cervical spine. Skull base and vertebrae: No acute fracture or suspicious osseous lesion. Atlantodental arthropathy with prominent spurring. Soft tissues and  spinal canal: No prevertebral fluid or swelling. No visible canal hematoma. Disc levels: Advanced disc degeneration at C5-6 and C6-7 with disc space narrowing and bulky osteophyte formation resulting in severe left-sided neural foraminal stenosis at C6-7 greater than C5-6. Severe multilevel facet arthrosis. Upper chest: Clear lung apices. Other: Calcific atherosclerosis at the left greater than right carotid bifurcations. IMPRESSION: 1. No evidence of acute intracranial abnormality. 2. Frontal scalp hematoma. 3. Mild-to-moderate chronic small vessel ischemic disease, cerebral atrophy, and chronic left cerebellar infarcts. 4. No acute maxillofacial or cervical spine fracture. Electronically Signed   By: Logan Bores M.D.   On: 10/22/2018 14:56   Dg Knee Complete 4 Views Left  Result Date: 10/22/2018 CLINICAL DATA:  Fall EXAM: LEFT KNEE - COMPLETE 4+ VIEW COMPARISON:  None. FINDINGS: No fracture or dislocation of the left knee. No joint effusion. There is moderate prepatellar soft tissue swelling. Popliteal fossa vascular calcifications mild. IMPRESSION: 1. No acute  osseous abnormality. 2. Moderate prepatellar soft tissue swelling. Electronically Signed   By: Ulyses Jarred M.D.   On: 10/22/2018 13:34   Ct Maxillofacial Wo Contrast  Result Date: 10/22/2018 CLINICAL DATA:  Fall today with abrasions and lacerations on the face. On Eliquis for atrial fibrillation. Initial encounter. EXAM: CT HEAD WITHOUT CONTRAST CT MAXILLOFACIAL WITHOUT CONTRAST CT CERVICAL SPINE WITHOUT CONTRAST TECHNIQUE: Multidetector CT imaging of the head, cervical spine, and maxillofacial structures were performed using the standard protocol without intravenous contrast. Multiplanar CT image reconstructions of the cervical spine and maxillofacial structures were also generated. COMPARISON:  Head, maxillofacial, and cervical spine CTs 12/19/2017 FINDINGS: CT HEAD FINDINGS Brain: There is no evidence of acute infarct, intracranial hemorrhage, mass, midline shift, or extra-axial fluid collection. Chronic infarcts in the left cerebellum are unchanged. Patchy cerebral white matter hypodensities are unchanged and nonspecific but compatible with mild-to-moderate chronic small vessel ischemic disease. There is mild-to-moderate cerebral atrophy. Vascular: Calcified atherosclerosis at the skull base. No hyperdense vessel. Skull: No fracture or focal osseous lesion. Other: Moderate-sized frontal scalp hematoma centered just left of midline. CT MAXILLOFACIAL FINDINGS Osseous: No acute fracture or destructive osseous process. Remote anterior nasal spine fracture. Moderate bilateral TMJ arthrosis without dislocation. Orbits: No acute traumatic finding. Bilateral cataract extraction. Sinuses: Paranasal sinuses and mastoid air cells are clear. Soft tissues: No acute finding decides above described frontal scalp hematoma. CT CERVICAL SPINE FINDINGS Alignment: Chronic mild reversal of the normal cervical lordosis and minimal anterolisthesis of, C3 on C4, C4 on C5, and C7 on T1. Mild left convex curvature of the lower  cervical spine. Skull base and vertebrae: No acute fracture or suspicious osseous lesion. Atlantodental arthropathy with prominent spurring. Soft tissues and spinal canal: No prevertebral fluid or swelling. No visible canal hematoma. Disc levels: Advanced disc degeneration at C5-6 and C6-7 with disc space narrowing and bulky osteophyte formation resulting in severe left-sided neural foraminal stenosis at C6-7 greater than C5-6. Severe multilevel facet arthrosis. Upper chest: Clear lung apices. Other: Calcific atherosclerosis at the left greater than right carotid bifurcations. IMPRESSION: 1. No evidence of acute intracranial abnormality. 2. Frontal scalp hematoma. 3. Mild-to-moderate chronic small vessel ischemic disease, cerebral atrophy, and chronic left cerebellar infarcts. 4. No acute maxillofacial or cervical spine fracture. Electronically Signed   By: Logan Bores M.D.   On: 10/22/2018 14:56    Procedures .Marland KitchenLaceration Repair Date/Time: 10/23/2018 4:33 PM Performed by: Jacqlyn Larsen, PA-C Authorized by: Jacqlyn Larsen, PA-C   Consent:    Consent obtained:  Verbal   Consent  given by:  Patient   Risks discussed:  Infection, pain, poor cosmetic result, need for additional repair and poor wound healing   Alternatives discussed:  No treatment Anesthesia (see MAR for exact dosages):    Anesthesia method:  Local infiltration   Local anesthetic:  Lidocaine 1% w/o epi Laceration details:    Location:  Face   Face location:  Nose   Length (cm):  2   Depth (mm):  3 Repair type:    Repair type:  Simple Pre-procedure details:    Preparation:  Patient was prepped and draped in usual sterile fashion and imaging obtained to evaluate for foreign bodies Exploration:    Hemostasis achieved with:  Direct pressure   Wound exploration: entire depth of wound probed and visualized     Wound extent: areolar tissue violated     Wound extent: no underlying fracture noted   Treatment:    Area cleansed  with:  Saline   Amount of cleaning:  Standard   Irrigation solution:  Sterile saline   Irrigation method:  Syringe Skin repair:    Repair method:  Sutures   Suture size:  5-0   Wound skin closure material used: Vicryl Rapide.   Suture technique:  Simple interrupted   Number of sutures:  3 Approximation:    Approximation:  Close Post-procedure details:    Dressing:  Adhesive bandage   Patient tolerance of procedure:  Tolerated well, no immediate complications   (including critical care time)  Medications Ordered in ED Medications  lidocaine (PF) (XYLOCAINE) 1 % injection 5 mL (5 mLs Infiltration Given 10/22/18 1654)  Tdap (BOOSTRIX) injection 0.5 mL (0.5 mLs Intramuscular Given 10/22/18 1655)     Initial Impression / Assessment and Plan / ED Course  I have reviewed the triage vital signs and the nursing notes.  Pertinent labs & imaging results that were available during my care of the patient were reviewed by me and considered in my medical decision making (see chart for details).  Presents for evaluation after mechanical fall.  Hit his head, with abrasion to the forehead with surrounding hematoma, no LOC although patient is on Eliquis currently.  Also a small laceration to the bridge of the nose without any underlying bony deformity palpable.  Patient denies neck or back pain, no chest pain or abdominal pain.  Does have abrasions to bilateral knees with bony tenderness over the left knee.  CT of the head, neck and maxillofacial bones ordered as well as x-ray of the left knee.  Imaging returned and shows no evidence of acute intracranial abnormality, no traumatic C-spine fracture or malalignment, no nasal or other facial bone fractures, no fractures over the knee.  Abrasions were cleaned and dressed appropriately, laceration over the bridge of the nose was repaired with 3 simple interrupted sutures and tolerated extremely well by the patient.  Head injury precautions discussed, at  this time patient is stable for discharge home, discussed appropriate wound care, dissolvable stitches placed so the patient will not need to return for suture removal.  Patient and wife expressed understanding and agreement with plan.  Stable for discharge home at this time.  Final Clinical Impressions(s) / ED Diagnoses   Final diagnoses:  Fall, initial encounter  Injury of head, initial encounter  Laceration of nose, initial encounter  Abrasions of multiple sites  Hematoma of scalp, initial encounter    ED Discharge Orders    None       Jacqlyn Larsen, [place] 10/23/18 1644  Duffy Bruce, MD 10/24/18 [phone]
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
## 2018-10-29 ENCOUNTER — Ambulatory Visit (HOSPITAL_COMMUNITY)
Admission: RE | Admit: 2018-10-29 | Discharge: 2018-10-29 | Disposition: A | Discharge status: Home / Self Care | Payer: PPO | Source: Ambulatory Visit | Attending: Internal Medicine | Admitting: Internal Medicine

## 2018-10-29 ENCOUNTER — Encounter (HOSPITAL_COMMUNITY): Payer: Self-pay | Admitting: Internal Medicine

## 2018-10-29 VITALS — BP 116/60 | HR 82 | Wt 191.0 lb

## 2018-10-29 DIAGNOSIS — Z9119 Patient's noncompliance with other medical treatment and regimen: Secondary | ICD-10-CM | POA: Insufficient documentation

## 2018-10-29 DIAGNOSIS — Z79899 Other long term (current) drug therapy: Secondary | ICD-10-CM | POA: Diagnosis not present

## 2018-10-29 DIAGNOSIS — Z9889 Other specified postprocedural states: Secondary | ICD-10-CM

## 2018-10-29 DIAGNOSIS — I10 Essential (primary) hypertension: Secondary | ICD-10-CM | POA: Insufficient documentation

## 2018-10-29 DIAGNOSIS — E669 Obesity, unspecified: Secondary | ICD-10-CM | POA: Diagnosis not present

## 2018-10-29 DIAGNOSIS — G4733 Obstructive sleep apnea (adult) (pediatric): Secondary | ICD-10-CM | POA: Diagnosis not present

## 2018-10-29 DIAGNOSIS — J310 Chronic rhinitis: Secondary | ICD-10-CM | POA: Insufficient documentation

## 2018-10-29 DIAGNOSIS — I671 Cerebral aneurysm, nonruptured: Secondary | ICD-10-CM | POA: Diagnosis not present

## 2018-10-29 DIAGNOSIS — I4821 Permanent atrial fibrillation: Secondary | ICD-10-CM | POA: Diagnosis not present

## 2018-10-29 DIAGNOSIS — Z7901 Long term (current) use of anticoagulants: Secondary | ICD-10-CM | POA: Diagnosis not present

## 2018-10-29 DIAGNOSIS — M545 Low back pain, unspecified: Secondary | ICD-10-CM

## 2018-10-29 DIAGNOSIS — K219 Gastro-esophageal reflux disease without esophagitis: Secondary | ICD-10-CM | POA: Diagnosis not present

## 2018-10-29 DIAGNOSIS — R110 Nausea: Secondary | ICD-10-CM | POA: Diagnosis not present

## 2018-10-29 DIAGNOSIS — E785 Hyperlipidemia, unspecified: Secondary | ICD-10-CM | POA: Insufficient documentation

## 2018-10-29 DIAGNOSIS — R5383 Other fatigue: Secondary | ICD-10-CM | POA: Insufficient documentation

## 2018-10-29 DIAGNOSIS — W010XXA Fall on same level from slipping, tripping and stumbling without subsequent striking against object, initial encounter: Secondary | ICD-10-CM | POA: Insufficient documentation

## 2018-10-29 NOTE — Progress Notes (Signed)
CARDIOLOGY CLINIC NOTE  Patient ID: Maurice Wood, male   DOB: [birth year], 84 y.o.   MRN: [id number]  HPI:  Maurice Wood is a delightful 84 y.o. male with a history of chronic atrial fibrillation, obstructive sleep apnea, with noncompliance with CPAP, hypertension, small intracranial aneurysms and history of spontaneous dissection of the right internal carotid artery in 2002.  Had episode of CP in January 2011 and underwent cath which showed normal coronaries with ef 45% (? artificially low due to AF). Echo  2/11: 60-65%  Echo 7/16  EF 60% Mod MR.  ECHO 2017 EF 60-65%.    Evaluated at [facility] 09/06/13 with chest pain. CEs and ECG negative. (ECG with minimal nonspecific ST abnormality v4-v6)  Sharp pain on left side of his chest while at church. Lasted about 90 minutes and resolved. Received aspirin and nitroglycerin.   Seen in ED 11/13 after a mechanical fall. CT and C-spine negative. Got 3 stitches to nose laceration.   He returns today for annual follow up with his wife. Overall doing okay. Says he tripped on his feet when he fell. Did not have any dizziness at the time. He does have dizziness multiple times/week when he gets up quickly. He gets SOB occasionally with mod exertion. Does okay with walking and ADLs. He has some mild BLE edema that comes and goes, worse at the end of the day. Denies orthopnea or PND. Back on CPAP qHS (off over lats week due to facial bruising). Mild improvement in energy level, but still tired. Denies bleeding on Eliquis. Taking all medications. Says he has a week worth left of Eliquis and is in the donut hole. Requests samples today.   Review of systems complete and found to be negative unless listed in HPI.   Past Medical History:  Diagnosis Date  . Allergy    rhinitis  . Atrial fibrillation [facility]) Jan 2007   echo 1-07 normal ejection fraction, no significant valvular disease. adenosine cardiolite 1-07  no evidence of ischemia. A 48 hour Holter monitor in 7-07 showed  good rate controlw/ chronic atrial fibrillation. Cath 1/11 normal cors. EF 45%. Echo 2-11 60-65%  . Benign prostatic hypertrophy   . Cerebral aneurysm    followed by Dr Estanislado Pandy  . Fatigue   . History of chest pain    a. s/p LHC 05/2014 with normal cors  . Hx of colonic polyps    diverticulosis  . Hyperlipidemia   . IBS (irritable bowel syndrome)   . Incontinence    Per pt 12/14/11  . Kidney stones   . Obesity   . Obstructive sleep apnea    noncompliant with CPAP  . Retinal vein occlusion     Current Outpatient Medications  Medication Sig Dispense Refill  . acetaminophen (TYLENOL) 650 MG CR tablet Take 1,300 mg by mouth every 8 (eight) hours as needed for pain.    Marland Kitchen apixaban (ELIQUIS) 5 MG TABS tablet TAKE 1 TABLET (5 MG TOTAL) BY MOUTH 2 (TWO) TIMES DAILY. 180 tablet 2  . azaTHIOprine (IMURAN) 50 MG tablet Take by mouth.    . cetirizine (ZYRTEC) 10 MG tablet Take 10 mg by mouth every morning.     . colestipol (COLESTID) 1 g tablet     . Cyanocobalamin (B-12 COMPLIANCE INJECTION) 1000 MCG/ML KIT Inject as directed every 30 (thirty) days.    . folic acid (FOLVITE) 1 MG tablet Take by mouth.    Marland Kitchen ipratropium (ATROVENT) 0.06 % nasal spray Place 1 spray into  both nostrils 4 (four) times daily.     Marland Kitchen loratadine (CLARITIN) 10 MG tablet Take 10 mg by mouth daily as needed for allergies.     No current facility-administered medications for this encounter.      PHYSICAL EXAM: Vitals:   10/29/18 1200  BP: 116/60  Pulse: 82  SpO2: 97%   Wt Readings from Last 3 Encounters:  10/29/18 86.6 kg (191 lb)  06/10/18 86.7 kg (191 lb 3.2 oz)  06/09/18 86.2 kg (190 lb)   Physical exam: General: Bruising on face. No resp difficulty. Walked in with a cane HEENT: Normal x for bruising. Ancteric Neck: Supple. JVP 5-6. Carotids 2+ bilat; no bruits. No thyromegaly or nodule noted. Cor: PMI nondisplaced. IRR, brief MR murmur at apex Lungs: CTAB, normal effort. No wheeze Abdomen: Soft,  non-tender, non-distended, no HSM. No bruits or masses. +BS  Extremities: No cyanosis, clubbing, or rash. R and LLE no edema. Bruising on left knee  Neuro: Alert & orientedx3, cranial nerves grossly intact. moves all 4 extremities w/o difficulty. Affect pleasant   Lab Results  Component Value Date   CHOL 166 02/24/2014   HDL 53.80 02/24/2014   LDLCALC 84 02/24/2014   LDLDIRECT 97.4 11/09/2011   TRIG 143.0 02/24/2014   CHOLHDL 3 02/24/2014    ASSESSMENT & PLAN:  1. A-fib -  Permanent. - Rate controlled. Continue eliquis 5 mg twice a day. Denies bleeding. Samples provided today (in donut hole)  2. Moderate MR - Moderate MR on echo 10/2017. ?prolapse, but leaflets not seen well.  - Repeat echo in 6 months at follow up  3. Fatigue  - Back on CPAP with mild improvement.  4. Mechanical fall - Refer for PT evaluation  Repeat echo in 6 months at follow up. Will plan to discharge from HF clinic and refer to Dr Harrell Gave at that time.  Refer for PT evaluation  Georgiana Shore NP-C  12:13 PM   Patient seen and examined with the above-signed Advanced Practice Provider and/or Housestaff. I personally reviewed laboratory data, imaging studies and relevant notes. I independently examined the patient and formulated the important aspects of the plan. I have edited the note to reflect any of my changes or salient points. I have personally discussed the plan with the patient and/or family.  84 y/o with permanent AF and moderate MR. Presents today after a mechanical fall with facial trauma. CT and xrays in ED without fracture. Fall clearly mechanical. No palpitations, CP or LOC. Will request PT evaluation to make sure he is steady on his feet and we can reduce risk of further falls given need for [facility]. Continue Eliquis 5 bid. Does not meet criteria for dose reduction .  Will see back in 6 months with echo. Then will turn over to Dr. Harrell Gave in [facility] for ongoing General Cardiology f/i.    Glori Bickers, MD  1:04 PM

## 2018-10-29 NOTE — Patient Instructions (Signed)
You have been referred to Physical Therapy. They will contact you schedule to get set up.   Your physician has requested that you have an echocardiogram. Echocardiography is a painless test that uses sound waves to create images of your heart. It provides your doctor with information about the size and shape of your heart and how well your heart's chambers and valves are working. This procedure takes approximately one hour. There are no restrictions for this procedure.  Follow up with Dr. Haroldine Laws in 6 months

## 2018-11-04 DIAGNOSIS — G4733 Obstructive sleep apnea (adult) (pediatric): Secondary | ICD-10-CM | POA: Diagnosis not present

## 2018-11-04 DIAGNOSIS — A419 Sepsis, unspecified organism: Secondary | ICD-10-CM | POA: Diagnosis not present

## 2018-11-09 DIAGNOSIS — A419 Sepsis, unspecified organism: Secondary | ICD-10-CM | POA: Diagnosis not present

## 2018-11-09 DIAGNOSIS — G4733 Obstructive sleep apnea (adult) (pediatric): Secondary | ICD-10-CM | POA: Diagnosis not present

## 2018-11-11 DIAGNOSIS — N3941 Urge incontinence: Secondary | ICD-10-CM | POA: Diagnosis not present

## 2018-11-13 DIAGNOSIS — D1801 Hemangioma of skin and subcutaneous tissue: Secondary | ICD-10-CM | POA: Diagnosis not present

## 2018-11-13 DIAGNOSIS — Z85828 Personal history of other malignant neoplasm of skin: Secondary | ICD-10-CM | POA: Diagnosis not present

## 2018-11-13 DIAGNOSIS — C44319 Basal cell carcinoma of skin of other parts of face: Secondary | ICD-10-CM | POA: Diagnosis not present

## 2018-11-13 DIAGNOSIS — D692 Other nonthrombocytopenic purpura: Secondary | ICD-10-CM | POA: Diagnosis not present

## 2018-11-13 DIAGNOSIS — L720 Epidermal cyst: Secondary | ICD-10-CM | POA: Diagnosis not present

## 2018-11-13 DIAGNOSIS — L570 Actinic keratosis: Secondary | ICD-10-CM | POA: Diagnosis not present

## 2018-11-19 ENCOUNTER — Ambulatory Visit: Payer: PPO | Attending: Internal Medicine

## 2018-11-19 ENCOUNTER — Encounter: Payer: Self-pay | Admitting: Podiatry

## 2018-11-19 ENCOUNTER — Other Ambulatory Visit: Payer: Self-pay

## 2018-11-19 ENCOUNTER — Ambulatory Visit: Payer: PPO | Admitting: Podiatry

## 2018-11-19 DIAGNOSIS — M6281 Muscle weakness (generalized): Secondary | ICD-10-CM

## 2018-11-19 DIAGNOSIS — G8929 Other chronic pain: Secondary | ICD-10-CM

## 2018-11-19 DIAGNOSIS — R2689 Other abnormalities of gait and mobility: Secondary | ICD-10-CM | POA: Diagnosis not present

## 2018-11-19 DIAGNOSIS — B351 Tinea unguium: Secondary | ICD-10-CM

## 2018-11-19 DIAGNOSIS — G629 Polyneuropathy, unspecified: Secondary | ICD-10-CM

## 2018-11-19 DIAGNOSIS — M79674 Pain in right toe(s): Secondary | ICD-10-CM

## 2018-11-19 DIAGNOSIS — M545 Low back pain: Secondary | ICD-10-CM | POA: Insufficient documentation

## 2018-11-19 DIAGNOSIS — M79675 Pain in left toe(s): Secondary | ICD-10-CM | POA: Diagnosis not present

## 2018-11-19 DIAGNOSIS — D689 Coagulation defect, unspecified: Secondary | ICD-10-CM

## 2018-11-19 NOTE — Patient Instructions (Signed)
Access Code: [email or web address]  URL: [email or web address]  Date: 11/19/2018  Prepared by: Sigurd Sos   Exercises  Seated Long Arc Quad - 10 reps - 2 sets - 5 hold - 3x daily - 7x weekly  Seated March - 10 reps - 3 sets - 3x daily - 7x weekly  Seated Heel Toe Raises - 10 reps - 2 sets - 3x daily - 7x weekly  Seated Heel Raise - 10 reps - 2 sets - 3x daily - 7x weekly  Seated Hamstring Stretch - 3 reps - 20 hold - 3x daily - 7x weekly

## 2018-11-19 NOTE — Therapy (Signed)
[facility] [address] [place], [place], [address] Phone: [phone]   Fax:  [phone]  Physical Therapy Evaluation  Patient Details  Name: Maurice Wood MRN: [id number] Date of Birth: [birth year] Referring Provider (PT): Glori Bickers, MD   Encounter Date: 11/19/2018  PT End of Session - 11/19/18 0823    Visit Number  1    Date for PT Re-Evaluation  01/14/19    Authorization Type  Medicare    PT Start Time  6962    PT Stop Time  0827    PT Time Calculation (min)  32 min    Activity Tolerance  Patient tolerated treatment well    Behavior During Therapy  [facility] for tasks assessed/performed       Past Medical History:  Diagnosis Date  . Allergy    rhinitis  . Atrial fibrillation [facility]) Jan 2007   echo 1-07 normal ejection fraction, no significant valvular disease. adenosine cardiolite 1-07  no evidence of ischemia. A 48 hour Holter monitor in 7-07 showed good rate controlw/ chronic atrial fibrillation. Cath 1/11 normal cors. EF 45%. Echo 2-11 60-65%  . Benign prostatic hypertrophy   . Cerebral aneurysm    followed by Dr Estanislado Pandy  . Fatigue   . History of chest pain    a. s/p LHC 05/2014 with normal cors  . Hx of colonic polyps    diverticulosis  . Hyperlipidemia   . IBS (irritable bowel syndrome)   . Incontinence    Per pt 12/14/11  . Kidney stones   . Obesity   . Obstructive sleep apnea    noncompliant with CPAP  . Retinal vein occlusion     Past Surgical History:  Procedure Laterality Date  . CARDIAC CATHETERIZATION  1999  . CHOLECYSTECTOMY    . HERNIA REPAIR    . inguinal and umbilical herniorrhaphy    . INTERSTIM IMPLANT PLACEMENT  04/2017  . LEFT HEART CATHETERIZATION WITH CORONARY ANGIOGRAM N/A 06/01/2014   Procedure: LEFT HEART CATHETERIZATION WITH CORONARY ANGIOGRAM;  Surgeon: Peter M Martinique, MD;  Location: [facility] CATH LAB;  Service: Cardiovascular;  Laterality: N/A;  . LITHOTRIPSY    .  TONSILLECTOMY AND ADENOIDECTOMY  as a child    There were no vitals filed for this visit.   Subjective Assessment - 11/19/18 0757    Subjective  Pt presents to PT with LBP that began ~6 months ago without cause.  Pt had a fall 10/22/18 and this aggravated this pain.  Pt also reports LE weakness of a chronic nature and main goal of PT is to improve strength.      Pertinent History  skin cancer    Diagnostic tests  CT scan- negative    Patient Stated Goals  improve strength and mobility, reduce LBP, reduce falls    Currently in Pain?  Yes    Pain Score  5    no pain now, up to 5/10   Pain Location  Back    Pain Orientation  Left;Right    Pain Descriptors / Indicators  Sore    Pain Type  Chronic pain    Pain Onset  More than a month ago    Pain Frequency  Intermittent    Aggravating Factors   as the day progresses    Pain Relieving Factors  early in the day, heat/ice, Tylenol         [facility] PT Assessment - 11/19/18 0001      Assessment  Medical Diagnosis  low back pain, atrial fibrillation    Referring Provider (PT)  Glori Bickers, MD    Onset Date/Surgical Date  05/20/18    Next MD Visit  6 months    Prior Therapy  none      Precautions   Precautions  Fall    Precaution Comments  Pt had a fall 10/22/18      Restrictions   Weight Bearing Restrictions  No      Balance Screen   Has the patient fallen in the past 6 months  Yes    How many times?  1    Has the patient had a decrease in activity level because of a fear of falling?   No    Is the patient reluctant to leave their home because of a fear of falling?   No      Home Environment   Living Environment  Private residence    Living Arrangements  Spouse/significant other    Type of [place] to enter    Entrance Stairs-Number of Steps  [place]  One level      Prior Function   Level of [place]  Retired    Leisure  none      Cognition    Overall Cognitive Status  Within Functional Limits for tasks assessed      Observation/Other Assessments   Focus on Therapeutic Outcomes (FOTO)   37% limitation      Posture/Postural Control   Posture/Postural Control  Postural limitations    Postural Limitations  Flexed trunk;Forward head;Rounded Shoulders      ROM / Strength   AROM / PROM / Strength  AROM;PROM;Strength      AROM   Overall AROM   Deficits    Overall AROM Comments  hip flexibility limited by 25%      PROM   Overall PROM   Deficits    Overall PROM Comments  hip flexibility is limited by 25-50% in all directions without pain      Strength   Overall Strength  Deficits    Overall Strength Comments  UE strength 4+/5 bil.    Strength Assessment Site  Hip;Knee;Ankle    Right/Left Hip  Right;Left    Right Hip Flexion  4/5    Right Hip ABduction  4/5    Left Hip Flexion  4/5    Left Hip ABduction  4/5    Right/Left Knee  Right;Left    Right Knee Flexion  4+/5    Right Knee Extension  4+/5    Left Knee Flexion  4+/5    Left Knee Extension  4+/5    Right/Left Ankle  Right;Left    Right Ankle Dorsiflexion  4/5    Left Ankle Dorsiflexion  4/5      Palpation   Palpation comment  diffuse palpalpable tenderness over bil lumbar paraspinals       Transfers   Five time sit to stand comments   14.57 seconds with hands      Ambulation/Gait   Ambulation/Gait  Yes    Ambulation/Gait Assistance  6: Modified independent (Device/Increase time)    Assistive device  Straight cane    Gait Pattern  Step-through pattern;Decreased step length - left;Decreased step length - right;Wide base of support    Ambulation Surface  Level  Objective measurements completed on examination: See above findings.              PT Education - 11/19/18 0817    Education Details   Access Code: [email or web address]     Person(s) Educated  Patient    Methods  Explanation;Demonstration;Handout    Comprehension  Verbalized  understanding;Returned demonstration       PT Short Term Goals - 11/19/18 0832      PT SHORT TERM GOAL #1   Title  be independent in initial HEP    Time  4    Period  Weeks    Status  New    Target Date  12/17/18      PT SHORT TERM GOAL #2   Title  perform 5x sit to stand in < or = to 12 seconds with use of hands    Time  4    Period  Weeks    Status  New    Target Date  12/17/18      PT SHORT TERM GOAL #3   Title  improve LE strength to perform sit to stand x 1 without UE support    Time  4    Period  Weeks    Status  New    Target Date  12/17/18      PT SHORT TERM GOAL #4   Title  report < or = to 4/10 LBP at the end of the day    Time  4    Period  Weeks    Target Date  12/17/18        PT Long Term Goals - 11/19/18 0833      PT LONG TERM GOAL #1   Title  be independent in advanced HEP    Time  8    Period  Weeks    Status  New    Target Date  01/14/19      PT LONG TERM GOAL #2   Title  reduce FOTO to < or = to 31% limitation    Time  8    Period  Weeks    Status  New    Target Date  01/14/19      PT LONG TERM GOAL #3   Title  perform 5x sit to stand in < or = to 14 seconds without use of arms to improve balance    Time  8    Period  Weeks    Status  New    Target Date  01/14/19      PT LONG TERM GOAL #4   Title  report < or = to 3/10 LBP at the end of the day    Time  8    Period  Weeks    Status  New    Target Date  01/14/19      PT LONG TERM GOAL #5   Title  improve LE strength to perform sit to stand without use of UE support > or = to 50% with functional mobility    Time  8    Period  Weeks    Status  New    Target Date  01/14/19             Plan - 11/19/18 0841    Clinical Impression Statement  Pt presents to PT with complaints of LBP and balance/strength deficits over the past 6 months.  Pt had 1 fall last month in his driveway.  Pt uses a cane for  ambulation for all distances.  Pt uses max UE support with sit to stand and  performs 5x sit to stand in 14.57 seconds with use of arms indicating falls risk.  Pt with decreased hip flexibility and reduced hip and ankle strength.  Pt reports 5/10 LBP at the end of the day.  Pt will benefit from skilled PT for LE strength, balance, gait and flexibility to address balance and LBP.  c    History and Personal Factors relevant to plan of care:  atrial fibrillation    Clinical Presentation  Stable    Clinical Presentation due to:  2 body parts- LBP and balance, atrial fibrillation    Clinical Decision Making  Moderate    Rehab Potential  Good    PT Frequency  2x / week    PT Duration  8 weeks    PT Treatment/Interventions  ADLs/Self Care Home Management;Cryotherapy;Moist Heat;Gait training;Stair training;Therapeutic activities;Therapeutic exercise;Patient/family education;Neuromuscular re-education;Manual techniques;Passive range of motion;Taping;Dry needling    PT Next Visit Plan  balance, LE strength, flexibility    PT Home Exercise Plan  Access Code: [email or web address]     Consulted and Agree with Plan of Care  Patient       Patient will benefit from skilled therapeutic intervention in order to improve the following deficits and impairments:  Abnormal gait, Pain, Impaired flexibility, Decreased safety awareness, Decreased activity tolerance, Decreased endurance, Decreased strength, Postural dysfunction, Improper body mechanics  Visit Diagnosis: Chronic bilateral low back pain without sciatica - Plan: PT plan of care cert/re-cert  Muscle weakness (generalized) - Plan: PT plan of care cert/re-cert  Other abnormalities of gait and mobility - Plan: PT plan of care cert/re-cert     Problem List Patient Active Problem List   Diagnosis Date Noted  . Bilateral lower extremity edema 06/02/2015  . UTI (lower urinary tract infection) 05/25/2015  . Sepsis ([place]) 05/25/2015  . Fever 05/25/2015  . Cough 05/25/2015  . Kidney stone 05/25/2015  . Hypokalemia 05/25/2015  . Acute urinary  retention   . H/O cardiac catheterizationn 12/2009 with normal coronary arteries 05/30/2014  . Low back pain 12/17/2013  . Chest pain 09/10/2013  . Urinary incontinence 06/04/2011  . Long term current use of anticoagulant 01/12/2011  . DM type 2 (diabetes mellitus, type 2) ([place]) 07/31/2010  . HEMORRHOIDS-INTERNAL 06/27/2010  . Sleep apnea 06/27/2010  . DIVERTICULOSIS OF COLON 01/03/2010  . GERD 11/22/2009  . IRRITABLE BOWEL SYNDROME 11/22/2009  . ERECTILE DYSFUNCTION 06/06/2009  . BPH (benign prostatic hyperplasia) 06/18/2008  . PSA, INCREASED 12/19/2007  . OBSTRUCTIVE SLEEP APNEA 07/24/2007  . DISSECTION, CAROTID ARTERY 07/24/2007  . TESTOSTERONE DEFICIENCY 07/23/2007  . HLD (hyperlipidemia) 07/23/2007  . COLONIC POLYPS, HX OF 07/23/2007  . Atrial fibrillation, permanent 07/14/2007    Sigurd Sos, PT 11/19/18 8:46 AM  [facility] [address], [place], [place], [address] Phone: [phone]   Fax:  [phone]  Name: Maurice Wood MRN: [id number] Date of Birth: Aug 03, 1933

## 2018-11-19 NOTE — Progress Notes (Signed)
Complaint:  Visit Type: Patient presents  to my office for  preventative foot care services. Complaint: Patient states" my nails have grown long and thick and become painful to walk and wear shoes" Patient has been diagnosed with neuropathy. The patient presents for preventative foot care services. No changes to ROS.  Patient is taking eliquiss.  Podiatric Exam: Vascular: dorsalis pedis and posterior tibial pulses are not  palpable bilateral. Capillary return is diminished . Temperature gradient is WNL. Skin turgor WNL  Sensorium: Absent  Semmes Weinstein monofilament test. Normal tactile sensation bilaterally. Nail Exam: Pt has thick disfigured discolored nails with subungual debris noted bilateral entire nail hallux B/L Ulcer Exam: There is no evidence of ulcer or pre-ulcerative changes or infection. Orthopedic Exam: Muscle tone and strength are WNL. No limitations in general ROM. No crepitus or effusions noted. Foot type and digits show no abnormalities. Bony prominences are unremarkable. Skin: No Porokeratosis. No infection or ulcers  Diagnosis:  Onychomycosis, , Pain in right toe, pain in left toes  Treatment & Plan Procedures and Treatment: Consent by patient was obtained for treatment procedures.   Debridement of mycotic and hypertrophic toenails, 1 through 5 bilateral and clearing of subungual debris. No ulceration, no infection noted.  Return Visit-Office Procedure: Patient instructed to return to the office for a follow up visit 3 months for continued evaluation and treatment.    Shenae Bonanno DPM 

## 2018-11-21 DIAGNOSIS — C44319 Basal cell carcinoma of skin of other parts of face: Secondary | ICD-10-CM | POA: Diagnosis not present

## 2018-11-21 DIAGNOSIS — Z85828 Personal history of other malignant neoplasm of skin: Secondary | ICD-10-CM | POA: Diagnosis not present

## 2018-11-24 ENCOUNTER — Ambulatory Visit: Payer: PPO | Admitting: Physical Therapy

## 2018-11-24 ENCOUNTER — Encounter: Payer: Self-pay | Admitting: Physical Therapy

## 2018-11-24 DIAGNOSIS — M545 Low back pain: Principal | ICD-10-CM

## 2018-11-24 DIAGNOSIS — R2689 Other abnormalities of gait and mobility: Secondary | ICD-10-CM

## 2018-11-24 DIAGNOSIS — G8929 Other chronic pain: Secondary | ICD-10-CM

## 2018-11-24 DIAGNOSIS — M6281 Muscle weakness (generalized): Secondary | ICD-10-CM

## 2018-11-24 NOTE — Therapy (Signed)
[facility] [address], [name], [place], [address] Phone: [phone]   Fax:  [phone]  Physical Therapy Treatment  Patient Details  Name: Maurice Wood MRN: [id number] Date of Birth: 1933-05-15 Referring Provider (PT): Glori Bickers, MD   Encounter Date: 11/24/2018  PT End of Session - 11/24/18 1257    Visit Number  2    Date for PT Re-Evaluation  01/14/19    Authorization Type  Medicare    PT Start Time  1230    PT Stop Time  1313    PT Time Calculation (min)  43 min    Activity Tolerance  Patient tolerated treatment well    Behavior During Therapy  [facility] for tasks assessed/performed       Past Medical History:  Diagnosis Date  . Allergy    rhinitis  . Atrial fibrillation [facility]) Jan 2007   echo 1-07 normal ejection fraction, no significant valvular disease. adenosine cardiolite 1-07  no evidence of ischemia. A 48 hour Holter monitor in 7-07 showed good rate controlw/ chronic atrial fibrillation. Cath 1/11 normal cors. EF 45%. Echo 2-11 60-65%  . Benign prostatic hypertrophy   . Cerebral aneurysm    followed by Dr Estanislado Pandy  . Fatigue   . History of chest pain    a. s/p LHC 05/2014 with normal cors  . Hx of colonic polyps    diverticulosis  . Hyperlipidemia   . IBS (irritable bowel syndrome)   . Incontinence    Per pt 12/14/11  . Kidney stones   . Obesity   . Obstructive sleep apnea    noncompliant with CPAP  . Retinal vein occlusion     Past Surgical History:  Procedure Laterality Date  . CARDIAC CATHETERIZATION  1999  . CHOLECYSTECTOMY    . HERNIA REPAIR    . inguinal and umbilical herniorrhaphy    . INTERSTIM IMPLANT PLACEMENT  04/2017  . LEFT HEART CATHETERIZATION WITH CORONARY ANGIOGRAM N/A 06/01/2014   Procedure: LEFT HEART CATHETERIZATION WITH CORONARY ANGIOGRAM;  Surgeon: Peter M Martinique, MD;  Location: [facility] CATH LAB;  Service: Cardiovascular;  Laterality: N/A;  . LITHOTRIPSY    .  TONSILLECTOMY AND ADENOIDECTOMY  as a child    There were no vitals filed for this visit.                    [facility] Adult PT Treatment/Exercise - 11/24/18 0001      Exercises   Exercises  Knee/Hip      Knee/Hip Exercises: Stretches   Active Hamstring Stretch  Right;Left;3 reps;30 seconds    Active Hamstring Stretch Limitations  seated      Knee/Hip Exercises: Aerobic   Nustep  Level 3 x 5 minutes      Knee/Hip Exercises: Seated   Long Arc Quad  AROM;Strengthening;Both;15 reps    Marching  AROM;Strengthening;Both;15 reps    Sit to [employer]  10 reps;with UE support      Knee/Hip Exercises: Supine   Bridges  Strengthening;10 reps    Straight Leg Raises  Strengthening;Both;2 sets;10 reps    Other Supine Knee/Hip Exercises  supine clam shells x 15 reps with red theraband             PT Education - 11/24/18 1255    Education Details  Edu on exercise techniques, all added hip abduction (clams shells) using red theraband    Person(s) Educated  Patient    Methods  Explanation;Demonstration  Comprehension  Verbalized understanding;Returned demonstration       PT Short Term Goals - 11/24/18 1303      PT SHORT TERM GOAL #1   Title  be independent in initial HEP    Baseline  continue to monitor    Period  Weeks    Status  On-going      PT SHORT TERM GOAL #2   Title  perform 5x sit to stand in < or = to 12 seconds with use of hands    Time  4    Period  Weeks    Status  New      PT SHORT TERM GOAL #3   Title  improve LE strength to perform sit to stand x 1 without UE support    Time  4    Period  Weeks    Status  New        PT Long Term Goals - 11/19/18 4656      PT LONG TERM GOAL #1   Title  be independent in advanced HEP    Time  8    Period  Weeks    Status  New    Target Date  01/14/19      PT LONG TERM GOAL #2   Title  reduce FOTO to < or = to 31% limitation    Time  8    Period  Weeks    Status  New    Target Date  01/14/19       PT LONG TERM GOAL #3   Title  perform 5x sit to stand in < or = to 14 seconds without use of arms to improve balance    Time  8    Period  Weeks    Status  New    Target Date  01/14/19      PT LONG TERM GOAL #4   Title  report < or = to 3/10 LBP at the end of the day    Time  8    Period  Weeks    Status  New    Target Date  01/14/19      PT LONG TERM GOAL #5   Title  improve LE strength to perform sit to stand without use of UE support > or = to 50% with functional mobility    Time  8    Period  Weeks    Status  New    Target Date  01/14/19            Plan - 11/24/18 1258    Clinical Impression Statement  Pt tolerating exericses well today with verbal education and cues for techniques. Pt required rest breaks between exercises due to fatigue. Pt's straight cane was adjusted to longer length to help pt's onset of R shoulder "soreness". Continue skilled PT to progress toward PLOF.     Rehab Potential  Good    PT Frequency  2x / week    PT Duration  8 weeks    PT Treatment/Interventions  ADLs/Self Care Home Management;Cryotherapy;Moist Heat;Gait training;Stair training;Therapeutic activities;Therapeutic exercise;Patient/family education;Neuromuscular re-education;Manual techniques;Passive range of motion;Taping;Dry needling    PT Next Visit Plan  balance, LE strength, flexibility    PT Home Exercise Plan  Access Code: [email or web address]     Consulted and Agree with Plan of Care  Patient       Patient will benefit from skilled therapeutic intervention in order to improve the following deficits and impairments:  Abnormal  gait, Pain, Impaired flexibility, Decreased safety awareness, Decreased activity tolerance, Decreased endurance, Decreased strength, Postural dysfunction, Improper body mechanics  Visit Diagnosis: Chronic bilateral low back pain without sciatica  Muscle weakness (generalized)  Other abnormalities of gait and mobility     Problem List Patient Active Problem List    Diagnosis Date Noted  . Bilateral lower extremity edema 06/02/2015  . UTI (lower urinary tract infection) 05/25/2015  . Sepsis ([place]) 05/25/2015  . Fever 05/25/2015  . Cough 05/25/2015  . Kidney stone 05/25/2015  . Hypokalemia 05/25/2015  . Acute urinary retention   . H/O cardiac catheterizationn 12/2009 with normal coronary arteries 05/30/2014  . Low back pain 12/17/2013  . Chest pain 09/10/2013  . Urinary incontinence 06/04/2011  . Long term current use of anticoagulant 01/12/2011  . DM type 2 (diabetes mellitus, type 2) ([place]) 07/31/2010  . HEMORRHOIDS-INTERNAL 06/27/2010  . Sleep apnea 06/27/2010  . DIVERTICULOSIS OF COLON 01/03/2010  . GERD 11/22/2009  . IRRITABLE BOWEL SYNDROME 11/22/2009  . ERECTILE DYSFUNCTION 06/06/2009  . BPH (benign prostatic hyperplasia) 06/18/2008  . PSA, INCREASED 12/19/2007  . OBSTRUCTIVE SLEEP APNEA 07/24/2007  . DISSECTION, CAROTID ARTERY 07/24/2007  . TESTOSTERONE DEFICIENCY 07/23/2007  . HLD (hyperlipidemia) 07/23/2007  . COLONIC POLYPS, HX OF 07/23/2007  . Atrial fibrillation, permanent 07/14/2007    Oretha Caprice, PT 11/24/2018, 1:10 PM  [facility] [address], [place], [place], [address] Phone: [phone]   Fax:  [phone]  Name: TREAVON CASTILLEJA MRN: [id number] Date of Birth: 1933/09/15

## 2018-11-27 ENCOUNTER — Encounter: Payer: Self-pay | Admitting: Physical Therapy

## 2018-11-27 ENCOUNTER — Ambulatory Visit: Payer: PPO | Admitting: Physical Therapy

## 2018-11-27 DIAGNOSIS — M545 Low back pain: Secondary | ICD-10-CM | POA: Diagnosis not present

## 2018-11-27 DIAGNOSIS — M6281 Muscle weakness (generalized): Secondary | ICD-10-CM

## 2018-11-27 DIAGNOSIS — R2689 Other abnormalities of gait and mobility: Secondary | ICD-10-CM

## 2018-11-27 DIAGNOSIS — G8929 Other chronic pain: Secondary | ICD-10-CM

## 2018-11-27 NOTE — Patient Instructions (Signed)
Access Code: [email or web address]  URL: [email or web address]  Date: 11/27/2018  Prepared by: Ruben Im   Exercises  Seated Long Arc Quad - 10 reps - 2 sets - 5 hold - 3x daily - 7x weekly  Seated March - 10 reps - 3 sets - 3x daily - 7x weekly  Seated Heel Toe Raises - 10 reps - 2 sets - 3x daily - 7x weekly  Seated Heel Raise - 10 reps - 2 sets - 3x daily - 7x weekly  Seated Hamstring Stretch - 3 reps - 20 hold - 3x daily - 7x weekly  Seated Hip Abduction with Resistance - 10 reps - 2 sets - 1x daily - 7x weekly  Seated Abdominal Press into [employer] - 10 reps - 1 sets - 1x daily - 7x weekly

## 2018-11-27 NOTE — Therapy (Signed)
[facility] [address] [place], [place], [address] Phone: [phone]   Fax:  [phone]  Physical Therapy Treatment  Patient Details  Name: Maurice Wood MRN: [id number] Date of Birth: Jun 04, 1933 Referring Provider (PT): Glori Bickers, MD   Encounter Date: 11/27/2018  PT End of Session - 11/27/18 0831    Visit Number  3    Date for PT Re-Evaluation  01/14/19    Authorization Type  Medicare    PT Start Time  0805    PT Stop Time  0843    PT Time Calculation (min)  38 min    Activity Tolerance  Patient tolerated treatment well       Past Medical History:  Diagnosis Date  . Allergy    rhinitis  . Atrial fibrillation [facility]) Jan 2007   echo 1-07 normal ejection fraction, no significant valvular disease. adenosine cardiolite 1-07  no evidence of ischemia. A 48 hour Holter monitor in 7-07 showed good rate controlw/ chronic atrial fibrillation. Cath 1/11 normal cors. EF 45%. Echo 2-11 60-65%  . Benign prostatic hypertrophy   . Cerebral aneurysm    followed by Dr Estanislado Pandy  . Fatigue   . History of chest pain    a. s/p LHC 05/2014 with normal cors  . Hx of colonic polyps    diverticulosis  . Hyperlipidemia   . IBS (irritable bowel syndrome)   . Incontinence    Per pt 12/14/11  . Kidney stones   . Obesity   . Obstructive sleep apnea    noncompliant with CPAP  . Retinal vein occlusion     Past Surgical History:  Procedure Laterality Date  . CARDIAC CATHETERIZATION  1999  . CHOLECYSTECTOMY    . HERNIA REPAIR    . inguinal and umbilical herniorrhaphy    . INTERSTIM IMPLANT PLACEMENT  04/2017  . LEFT HEART CATHETERIZATION WITH CORONARY ANGIOGRAM N/A 06/01/2014   Procedure: LEFT HEART CATHETERIZATION WITH CORONARY ANGIOGRAM;  Surgeon: Peter M Martinique, MD;  Location: [facility] CATH LAB;  Service: Cardiovascular;  Laterality: N/A;  . LITHOTRIPSY    . TONSILLECTOMY AND ADENOIDECTOMY  as a child    There were no  vitals filed for this visit.  Subjective Assessment - 11/27/18 0808    Subjective  My back is usually ok in the mornings, hurts more as the day goes on and being on my feet.      Currently in Pain?  No/denies    Pain Score  0-No pain    Pain Location  Back    Pain Type  Chronic pain    Aggravating Factors   as the day goes on, being on my feet                       [facility] Adult PT Treatment/Exercise - 11/27/18 0001      Knee/Hip Exercises: Stretches   Active Hamstring Stretch  Right;Left;3 reps;30 seconds    Active Hamstring Stretch Limitations  seated      Knee/Hip Exercises: Aerobic   Nustep  Level 3 x 7 minutes   while discussing status and progress     Knee/Hip Exercises: Standing   Heel Raises  10 reps    Hip Abduction  AROM;Right;Left;10 reps    Other Standing Knee Exercises  step taps 10x alternating       Knee/Hip Exercises: Seated   Long Arc Quad  AROM;Strengthening;Both;2 sets;10 reps    Long [employer]  Limitations  red band    Clamshell with TheraBand  Red   20x   Other Seated Knee/Hip Exercises  foam roll push down to activate transverse abdominus muscles 10x     Marching  AROM;Strengthening;Both;10 reps    Marching Limitations  red band    Sit to [place]  10 reps;with UE support   from chair with black foam pad            PT Education - 11/27/18 0831    Education Details   Access Code: [email or web address] add red band loop to feet for LAQ and marching;  seated clams with band , isometric push down on top of cane    Person(s) Educated  Patient    Methods  Explanation;Demonstration;Handout    Comprehension  Returned demonstration;Verbalized understanding       PT Short Term Goals - 11/24/18 1303      PT SHORT TERM GOAL #1   Title  be independent in initial HEP    Baseline  continue to monitor    Period  Weeks    Status  On-going      PT SHORT TERM GOAL #2   Title  perform 5x sit to stand in < or = to 12 seconds with use of hands    Time  4     Period  Weeks    Status  New      PT SHORT TERM GOAL #3   Title  improve LE strength to perform sit to stand x 1 without UE support    Time  4    Period  Weeks    Status  New        PT Long Term Goals - 11/19/18 5009      PT LONG TERM GOAL #1   Title  be independent in advanced HEP    Time  8    Period  Weeks    Status  New    Target Date  01/14/19      PT LONG TERM GOAL #2   Title  reduce FOTO to < or = to 31% limitation    Time  8    Period  Weeks    Status  New    Target Date  01/14/19      PT LONG TERM GOAL #3   Title  perform 5x sit to stand in < or = to 14 seconds without use of arms to improve balance    Time  8    Period  Weeks    Status  New    Target Date  01/14/19      PT LONG TERM GOAL #4   Title  report < or = to 3/10 LBP at the end of the day    Time  8    Period  Weeks    Status  New    Target Date  01/14/19      PT LONG TERM GOAL #5   Title  improve LE strength to perform sit to stand without use of UE support > or = to 50% with functional mobility    Time  8    Period  Weeks    Status  New    Target Date  01/14/19            Plan - 11/27/18 0825    Clinical Impression Statement  The patient is able to perform a slight progression of added resistance to seated ex and the addition of  standing ex's today.  He does fatigue quickly and complains of intermittent leg "cramps" at times.  Therapist monitoring response closely and modifying as needed with rest breaks and change of position as needed.  Continue with treatment plan with progression as tolerated.      Rehab Potential  Good    PT Frequency  2x / week    PT Duration  8 weeks    PT Treatment/Interventions  ADLs/Self Care Home Management;Cryotherapy;Moist Heat;Gait training;Stair training;Therapeutic activities;Therapeutic exercise;Patient/family education;Neuromuscular re-education;Manual techniques;Passive range of motion;Taping;Dry needling    PT Next Visit Plan  balance, LE strength,  flexibility    PT Home Exercise Plan  Access Code: [email or web address]        Patient will benefit from skilled therapeutic intervention in order to improve the following deficits and impairments:  Abnormal gait, Pain, Impaired flexibility, Decreased safety awareness, Decreased activity tolerance, Decreased endurance, Decreased strength, Postural dysfunction, Improper body mechanics  Visit Diagnosis: Chronic bilateral low back pain without sciatica  Muscle weakness (generalized)  Other abnormalities of gait and mobility     Problem List Patient Active Problem List   Diagnosis Date Noted  . Bilateral lower extremity edema 06/02/2015  . UTI (lower urinary tract infection) 05/25/2015  . Sepsis ([place]) 05/25/2015  . Fever 05/25/2015  . Cough 05/25/2015  . Kidney stone 05/25/2015  . Hypokalemia 05/25/2015  . Acute urinary retention   . H/O cardiac catheterizationn 12/2009 with normal coronary arteries 05/30/2014  . Low back pain 12/17/2013  . Chest pain 09/10/2013  . Urinary incontinence 06/04/2011  . Long term current use of anticoagulant 01/12/2011  . DM type 2 (diabetes mellitus, type 2) ([place]) 07/31/2010  . HEMORRHOIDS-INTERNAL 06/27/2010  . Sleep apnea 06/27/2010  . DIVERTICULOSIS OF COLON 01/03/2010  . GERD 11/22/2009  . IRRITABLE BOWEL SYNDROME 11/22/2009  . ERECTILE DYSFUNCTION 06/06/2009  . BPH (benign prostatic hyperplasia) 06/18/2008  . PSA, INCREASED 12/19/2007  . OBSTRUCTIVE SLEEP APNEA 07/24/2007  . DISSECTION, CAROTID ARTERY 07/24/2007  . TESTOSTERONE DEFICIENCY 07/23/2007  . HLD (hyperlipidemia) 07/23/2007  . COLONIC POLYPS, HX OF 07/23/2007  . Atrial fibrillation, permanent 07/14/2007   Ruben Im, PT 11/27/18 5:26 PM Phone: [phone] Fax: [phone]  Alvera Singh 11/27/2018, 5:26 PM  [facility] [address], [place], [place], [address] Phone: [phone]   Fax:  [phone]  Name:  Maurice Wood MRN: [id number] Date of Birth: 1933-07-20

## 2018-11-28 DIAGNOSIS — H348322 Tributary (branch) retinal vein occlusion, left eye, stable: Secondary | ICD-10-CM | POA: Diagnosis not present

## 2018-11-28 DIAGNOSIS — Z961 Presence of intraocular lens: Secondary | ICD-10-CM | POA: Diagnosis not present

## 2018-11-28 DIAGNOSIS — H30031 Focal chorioretinal inflammation, peripheral, right eye: Secondary | ICD-10-CM | POA: Diagnosis not present

## 2018-11-28 DIAGNOSIS — H43813 Vitreous degeneration, bilateral: Secondary | ICD-10-CM | POA: Diagnosis not present

## 2018-11-28 DIAGNOSIS — Z79899 Other long term (current) drug therapy: Secondary | ICD-10-CM | POA: Diagnosis not present

## 2018-11-28 DIAGNOSIS — Z8669 Personal history of other diseases of the nervous system and sense organs: Secondary | ICD-10-CM | POA: Diagnosis not present

## 2018-11-28 DIAGNOSIS — H35372 Puckering of macula, left eye: Secondary | ICD-10-CM | POA: Diagnosis not present

## 2018-11-28 DIAGNOSIS — H35352 Cystoid macular degeneration, left eye: Secondary | ICD-10-CM | POA: Diagnosis not present

## 2018-11-28 DIAGNOSIS — H35342 Macular cyst, hole, or pseudohole, left eye: Secondary | ICD-10-CM | POA: Diagnosis not present

## 2018-12-01 ENCOUNTER — Ambulatory Visit: Payer: PPO | Admitting: Physical Therapy

## 2018-12-01 ENCOUNTER — Encounter: Payer: Self-pay | Admitting: Physical Therapy

## 2018-12-01 DIAGNOSIS — R2689 Other abnormalities of gait and mobility: Secondary | ICD-10-CM

## 2018-12-01 DIAGNOSIS — G8929 Other chronic pain: Secondary | ICD-10-CM

## 2018-12-01 DIAGNOSIS — M6281 Muscle weakness (generalized): Secondary | ICD-10-CM

## 2018-12-01 DIAGNOSIS — M545 Low back pain, unspecified: Secondary | ICD-10-CM

## 2018-12-01 NOTE — Patient Instructions (Signed)
Access Code: [email or web address]  URL: [email or web address]  Date: 12/01/2018  Prepared by: Venetia Night Sahil Milner   Exercises  Seated Long Arc Quad - 10 reps - 2 sets - 5 hold - 3x daily - 7x weekly  Seated March - 10 reps - 3 sets - 3x daily - 7x weekly  Seated Heel Toe Raises - 10 reps - 2 sets - 3x daily - 7x weekly  Seated Heel Raise - 10 reps - 2 sets - 3x daily - 7x weekly  Seated Hamstring Stretch - 3 reps - 20 hold - 3x daily - 7x weekly  Seated Hip Abduction with Resistance - 10 reps - 2 sets - 1x daily - 7x weekly  Seated Abdominal Press into [employer] - 10 reps - 1 sets - 1x daily - 7x weekly  Seated Hamstring Curls with Resistance - 15 reps - 2 sets - 1x daily - 7x weekly  Seated March with Resistance - 20 reps - 1 sets - 1x daily - 7x weekly  Seated Figure 4 Piriformis Stretch - 2 reps - 1 sets - 20 hold - 1x daily - 7x weekly

## 2018-12-01 NOTE — Therapy (Signed)
[facility] [address], [place], [place], [address] Phone: [phone]   Fax:  [phone]  Physical Therapy Treatment  Patient Details  Name: Maurice Wood MRN: [id number] Date of Birth: 1933/11/06 Referring Provider (PT): Glori Bickers, MD   Encounter Date: 12/01/2018  PT End of Session - 12/01/18 1016    Visit Number  4    Date for PT Re-Evaluation  01/14/19    Authorization Type  Medicare    PT Start Time  1016    PT Stop Time  1059    PT Time Calculation (min)  43 min    Activity Tolerance  Patient tolerated treatment well    Behavior During Therapy  [facility] for tasks assessed/performed       Past Medical History:  Diagnosis Date  . Allergy    rhinitis  . Atrial fibrillation [facility]) Jan 2007   echo 1-07 normal ejection fraction, no significant valvular disease. adenosine cardiolite 1-07  no evidence of ischemia. A 48 hour Holter monitor in 7-07 showed good rate controlw/ chronic atrial fibrillation. Cath 1/11 normal cors. EF 45%. Echo 2-11 60-65%  . Benign prostatic hypertrophy   . Cerebral aneurysm    followed by Dr Estanislado Pandy  . Fatigue   . History of chest pain    a. s/p LHC 05/2014 with normal cors  . Hx of colonic polyps    diverticulosis  . Hyperlipidemia   . IBS (irritable bowel syndrome)   . Incontinence    Per pt 12/14/11  . Kidney stones   . Obesity   . Obstructive sleep apnea    noncompliant with CPAP  . Retinal vein occlusion     Past Surgical History:  Procedure Laterality Date  . CARDIAC CATHETERIZATION  1999  . CHOLECYSTECTOMY    . HERNIA REPAIR    . inguinal and umbilical herniorrhaphy    . INTERSTIM IMPLANT PLACEMENT  04/2017  . LEFT HEART CATHETERIZATION WITH CORONARY ANGIOGRAM N/A 06/01/2014   Procedure: LEFT HEART CATHETERIZATION WITH CORONARY ANGIOGRAM;  Surgeon: Peter M Martinique, MD;  Location: [facility] CATH LAB;  Service: Cardiovascular;  Laterality: N/A;  . LITHOTRIPSY    .  TONSILLECTOMY AND ADENOIDECTOMY  as a child    There were no vitals filed for this visit.  Subjective Assessment - 12/01/18 1016    Subjective  Hamstring stretch hurts.  Pain is about a 3-4/10.    Pertinent History  skin cancer    Diagnostic tests  CT scan- negative    Patient Stated Goals  improve strength and mobility, reduce LBP, reduce falls    Currently in Pain?  Yes    Pain Score  3     Pain Location  Back    Pain Orientation  Right;Left    Pain Descriptors / Indicators  Sore    Pain Type  Chronic pain    Pain Onset  More than a month ago    Pain Frequency  Intermittent                       [facility] Adult PT Treatment/Exercise - 12/01/18 0001      Exercises   Exercises  Lumbar;Knee/Hip      Lumbar Exercises: Stretches   Active Hamstring Stretch  2 reps;30 seconds    Lower Trunk Rotation  5 reps;10 seconds    Figure 4 Stretch  2 reps;20 seconds;Without overpressure;Seated    Figure 4 Stretch Limitations  added to HEP  Knee/Hip Exercises: Aerobic   Nustep  L3 x 8', legs only as tolerated   PT present to monitor effort, symptoms     Knee/Hip Exercises: Seated   Long Arc Quad  Strengthening;Both;15 reps;2 sets    Long Arc Quad Limitations  red band    Clamshell with TheraBand  Red   20x   Marching  Strengthening;Both;20 reps    Marching Limitations  red band    Hamstring Curl  Strengthening;Both;15 reps    Hamstring Limitations  red band    Sit to [employer]  15 reps   most without UE support, final reps hands on thighs     Knee/Hip Exercises: Supine   Straight Leg Raises  Strengthening;Both;10 reps;1 set          Balance Exercises - 12/01/18 1028      Balance Exercises: Standing   Standing Eyes Opened  Narrow base of support (BOS);Head turns;30 secs    Standing Eyes Closed  Wide (BOA);30 secs   intermittent UE support   Standing, One Foot on a Step  Eyes open;6 inch;2 reps;15 secs   intermittent UE support       PT Education - 12/01/18  1050    Education Details  Access Code: [email or web address]     Person(s) Educated  Patient    Methods  Explanation;Demonstration;Handout;Verbal cues    Comprehension  Verbalized understanding;Returned demonstration       PT Short Term Goals - 12/01/18 1052      PT SHORT TERM GOAL #1   Title  be independent in initial HEP    Baseline  continue to monitor    Time  4    Period  Weeks    Status  On-going      PT SHORT TERM GOAL #2   Title  perform 5x sit to stand in < or = to 12 seconds with use of hands    Time  4    Period  Weeks    Status  On-going      PT SHORT TERM GOAL #3   Title  improve LE strength to perform sit to stand x 1 without UE support    Time  4    Period  Weeks    Status  Achieved        PT Long Term Goals - 11/19/18 2119      PT LONG TERM GOAL #1   Title  be independent in advanced HEP    Time  8    Period  Weeks    Status  New    Target Date  01/14/19      PT LONG TERM GOAL #2   Title  reduce FOTO to < or = to 31% limitation    Time  8    Period  Weeks    Status  New    Target Date  01/14/19      PT LONG TERM GOAL #3   Title  perform 5x sit to stand in < or = to 14 seconds without use of arms to improve balance    Time  8    Period  Weeks    Status  New    Target Date  01/14/19      PT LONG TERM GOAL #4   Title  report < or = to 3/10 LBP at the end of the day    Time  8    Period  Weeks    Status  New  Target Date  01/14/19      PT LONG TERM GOAL #5   Title  improve LE strength to perform sit to stand without use of UE support > or = to 50% with functional mobility    Time  8    Period  Weeks    Status  New    Target Date  01/14/19            Plan - 12/01/18 1050    Clinical Impression Statement  Pt with good tolerance of new ther ex and stretches today.  Able to perform increasing reps of sit to stand and other LE ther ex today.  Pt able to perform multiple reps of sit to stand today without UE assist, meeting a STG.  Pt compliant  with HEP.  PT added standing balance trials today with various challenges which Pt used UEs for as needed.  Pt will continue to benefit from skilled PT to address strength, balance and functional activities to address deficits along POC.    Rehab Potential  Good    PT Frequency  2x / week    PT Duration  8 weeks    PT Treatment/Interventions  ADLs/Self Care Home Management;Cryotherapy;Moist Heat;Gait training;Stair training;Therapeutic activities;Therapeutic exercise;Patient/family education;Neuromuscular re-education;Manual techniques;Passive range of motion;Taping;Dry needling    PT Next Visit Plan  balance, LE strength, flexibility    PT Home Exercise Plan  Access Code: [email or web address]     Consulted and Agree with Plan of Care  Patient       Patient will benefit from skilled therapeutic intervention in order to improve the following deficits and impairments:  Abnormal gait, Pain, Impaired flexibility, Decreased safety awareness, Decreased activity tolerance, Decreased endurance, Decreased strength, Postural dysfunction, Improper body mechanics  Visit Diagnosis: Chronic bilateral low back pain without sciatica  Muscle weakness (generalized)  Other abnormalities of gait and mobility     Problem List Patient Active Problem List   Diagnosis Date Noted  . Bilateral lower extremity edema 06/02/2015  . UTI (lower urinary tract infection) 05/25/2015  . Sepsis ([place]) 05/25/2015  . Fever 05/25/2015  . Cough 05/25/2015  . Kidney stone 05/25/2015  . Hypokalemia 05/25/2015  . Acute urinary retention   . H/O cardiac catheterizationn 12/2009 with normal coronary arteries 05/30/2014  . Low back pain 12/17/2013  . Chest pain 09/10/2013  . Urinary incontinence 06/04/2011  . Long term current use of anticoagulant 01/12/2011  . DM type 2 (diabetes mellitus, type 2) ([place]) 07/31/2010  . HEMORRHOIDS-INTERNAL 06/27/2010  . Sleep apnea 06/27/2010  . DIVERTICULOSIS OF COLON 01/03/2010  . GERD 11/22/2009   . IRRITABLE BOWEL SYNDROME 11/22/2009  . ERECTILE DYSFUNCTION 06/06/2009  . BPH (benign prostatic hyperplasia) 06/18/2008  . PSA, INCREASED 12/19/2007  . OBSTRUCTIVE SLEEP APNEA 07/24/2007  . DISSECTION, CAROTID ARTERY 07/24/2007  . TESTOSTERONE DEFICIENCY 07/23/2007  . HLD (hyperlipidemia) 07/23/2007  . COLONIC POLYPS, HX OF 07/23/2007  . Atrial fibrillation, permanent 07/14/2007   Baruch Merl, PT 12/01/18 10:59 AM   [facility] [address] [place], [place], [address] Phone: [phone]   Fax:  [phone]  Name: Maurice Wood MRN: [id number] Date of Birth: 1933-11-04

## 2018-12-04 ENCOUNTER — Ambulatory Visit: Payer: PPO | Admitting: Physical Therapy

## 2018-12-04 ENCOUNTER — Encounter: Payer: Self-pay | Admitting: Physical Therapy

## 2018-12-04 DIAGNOSIS — M545 Low back pain, unspecified: Secondary | ICD-10-CM

## 2018-12-04 DIAGNOSIS — G8929 Other chronic pain: Secondary | ICD-10-CM

## 2018-12-04 DIAGNOSIS — M6281 Muscle weakness (generalized): Secondary | ICD-10-CM

## 2018-12-04 DIAGNOSIS — R2689 Other abnormalities of gait and mobility: Secondary | ICD-10-CM

## 2018-12-04 NOTE — Therapy (Signed)
[facility] [address] [place], [place], [address] Phone: [phone]   Fax:  [phone]  Physical Therapy Treatment  Patient Details  Name: Maurice Wood MRN: [id number] Date of Birth: 11-Jul-1933 Referring Provider (PT): Glori Bickers, MD   Encounter Date: 12/04/2018  PT End of Session - 12/04/18 0850    Visit Number  5    Date for PT Re-Evaluation  01/14/19    Authorization Type  Medicare    PT Start Time  0843    PT Stop Time  0925    PT Time Calculation (min)  42 min    Activity Tolerance  Patient tolerated treatment well       Past Medical History:  Diagnosis Date  . Allergy    rhinitis  . Atrial fibrillation [facility]) Jan 2007   echo 1-07 normal ejection fraction, no significant valvular disease. adenosine cardiolite 1-07  no evidence of ischemia. A 48 hour Holter monitor in 7-07 showed good rate controlw/ chronic atrial fibrillation. Cath 1/11 normal cors. EF 45%. Echo 2-11 60-65%  . Benign prostatic hypertrophy   . Cerebral aneurysm    followed by Dr Estanislado Pandy  . Fatigue   . History of chest pain    a. s/p LHC 05/2014 with normal cors  . Hx of colonic polyps    diverticulosis  . Hyperlipidemia   . IBS (irritable bowel syndrome)   . Incontinence    Per pt 12/14/11  . Kidney stones   . Obesity   . Obstructive sleep apnea    noncompliant with CPAP  . Retinal vein occlusion     Past Surgical History:  Procedure Laterality Date  . CARDIAC CATHETERIZATION  1999  . CHOLECYSTECTOMY    . HERNIA REPAIR    . inguinal and umbilical herniorrhaphy    . INTERSTIM IMPLANT PLACEMENT  04/2017  . LEFT HEART CATHETERIZATION WITH CORONARY ANGIOGRAM N/A 06/01/2014   Procedure: LEFT HEART CATHETERIZATION WITH CORONARY ANGIOGRAM;  Surgeon: Peter M Martinique, MD;  Location: [facility] CATH LAB;  Service: Cardiovascular;  Laterality: N/A;  . LITHOTRIPSY    . TONSILLECTOMY AND ADENOIDECTOMY  as a child    There were no  vitals filed for this visit.  Subjective Assessment - 12/04/18 0846    Subjective  I reached up in the cabinet and strained my right shoulder.  My back is OK this morning.      Currently in Pain?  Yes    Pain Score  4     Pain Location  Shoulder    Pain Orientation  Right                       [facility] Adult PT Treatment/Exercise - 12/04/18 0001      Neuro Re-ed    Neuro Re-ed Details   head turns with narrow base of support on floor and with  standing on foam pad;  weight shifting 4 ways      Lumbar Exercises: Standing   Heel Raises  15 reps      Knee/Hip Exercises: Stretches   Active Hamstring Stretch  Right;Left;5 reps    Active Hamstring Stretch Limitations  on 2nd step     Hip Flexor Stretch  Right;Left;5 reps    Hip Flexor Stretch Limitations  on 2nd step       Knee/Hip Exercises: Aerobic   Nustep  L3 x 8'  seat 11   PT present to monitor effort, symptoms  Knee/Hip Exercises: Standing   Hip Extension  Stengthening;Right;Left;10 reps    Extension Limitations  yellow band    Forward Step Up  Right;Left;10 reps;Hand Hold: 2;Step Height: 6"    Other Standing Knee Exercises  step taps 10x alternating       Knee/Hip Exercises: Seated   Long Arc Quad  AROM;Strengthening;Both;2 sets;10 reps    Long Arc Quad Limitations  red band    Clamshell with TheraBand  Green   single and double   Other Seated Knee/Hip Exercises  foam roll push down to activate transverse abdominus muscles 10x     Marching  Strengthening;Both;20 reps    Marching Limitations  red band    Sit to [employer]  10 reps;with UE support   from chair with black foam pad              PT Short Term Goals - 12/01/18 1052      PT SHORT TERM GOAL #1   Title  be independent in initial HEP    Baseline  continue to monitor    Time  4    Period  Weeks    Status  On-going      PT SHORT TERM GOAL #2   Title  perform 5x sit to stand in < or = to 12 seconds with use of hands    Time  4     Period  Weeks    Status  On-going      PT SHORT TERM GOAL #3   Title  improve LE strength to perform sit to stand x 1 without UE support    Time  4    Period  Weeks    Status  Achieved        PT Long Term Goals - 11/19/18 4166      PT LONG TERM GOAL #1   Title  be independent in advanced HEP    Time  8    Period  Weeks    Status  New    Target Date  01/14/19      PT LONG TERM GOAL #2   Title  reduce FOTO to < or = to 31% limitation    Time  8    Period  Weeks    Status  New    Target Date  01/14/19      PT LONG TERM GOAL #3   Title  perform 5x sit to stand in < or = to 14 seconds without use of arms to improve balance    Time  8    Period  Weeks    Status  New    Target Date  01/14/19      PT LONG TERM GOAL #4   Title  report < or = to 3/10 LBP at the end of the day    Time  8    Period  Weeks    Status  New    Target Date  01/14/19      PT LONG TERM GOAL #5   Title  improve LE strength to perform sit to stand without use of UE support > or = to 50% with functional mobility    Time  8    Period  Weeks    Status  New    Target Date  01/14/19            Plan - 12/04/18 0851    Clinical Impression Statement  The patient is able to increase standing  tolerance time without complaints of LBP.  He needs close supervision for safety with standing balance exercises.  He has difficulty stabilizing on a soft surface and tends to lose his balance with shifting his weight backward.   Still requires UE use to rise from a standard height chair.  On  track to meet STGs in 1-2 weeks.      Rehab Potential  Good    PT Frequency  2x / week    PT Duration  8 weeks    PT Treatment/Interventions  ADLs/Self Care Home Management;Cryotherapy;Moist Heat;Gait training;Stair training;Therapeutic activities;Therapeutic exercise;Patient/family education;Neuromuscular re-education;Manual techniques;Passive range of motion;Taping;Dry needling    PT Next Visit Plan  balance, LE strength,  flexibility    PT Home Exercise Plan  Access Code: [email or web address]        Patient will benefit from skilled therapeutic intervention in order to improve the following deficits and impairments:  Abnormal gait, Pain, Impaired flexibility, Decreased safety awareness, Decreased activity tolerance, Decreased endurance, Decreased strength, Postural dysfunction, Improper body mechanics  Visit Diagnosis: Chronic bilateral low back pain without sciatica  Muscle weakness (generalized)  Other abnormalities of gait and mobility     Problem List Patient Active Problem List   Diagnosis Date Noted  . Bilateral lower extremity edema 06/02/2015  . UTI (lower urinary tract infection) 05/25/2015  . Sepsis ([place]) 05/25/2015  . Fever 05/25/2015  . Cough 05/25/2015  . Kidney stone 05/25/2015  . Hypokalemia 05/25/2015  . Acute urinary retention   . H/O cardiac catheterizationn 12/2009 with normal coronary arteries 05/30/2014  . Low back pain 12/17/2013  . Chest pain 09/10/2013  . Urinary incontinence 06/04/2011  . Long term current use of anticoagulant 01/12/2011  . DM type 2 (diabetes mellitus, type 2) ([place]) 07/31/2010  . HEMORRHOIDS-INTERNAL 06/27/2010  . Sleep apnea 06/27/2010  . DIVERTICULOSIS OF COLON 01/03/2010  . GERD 11/22/2009  . IRRITABLE BOWEL SYNDROME 11/22/2009  . ERECTILE DYSFUNCTION 06/06/2009  . BPH (benign prostatic hyperplasia) 06/18/2008  . PSA, INCREASED 12/19/2007  . OBSTRUCTIVE SLEEP APNEA 07/24/2007  . DISSECTION, CAROTID ARTERY 07/24/2007  . TESTOSTERONE DEFICIENCY 07/23/2007  . HLD (hyperlipidemia) 07/23/2007  . COLONIC POLYPS, HX OF 07/23/2007  . Atrial fibrillation, permanent 07/14/2007   Ruben Im, PT 12/04/18 2:29 PM Phone: [phone] Fax: [phone]  Alvera Singh 12/04/2018, 2:29 PM  [facility] [address], [place], [place], [address] Phone: [phone]   Fax:  [phone]  Name:  Maurice Wood MRN: [id number] Date of Birth: Jun 02, 1933
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
## 2018-12-10 DIAGNOSIS — A419 Sepsis, unspecified organism: Secondary | ICD-10-CM | POA: Diagnosis not present

## 2018-12-10 DIAGNOSIS — G4733 Obstructive sleep apnea (adult) (pediatric): Secondary | ICD-10-CM | POA: Diagnosis not present

## 2018-12-11 ENCOUNTER — Ambulatory Visit: Payer: PPO | Attending: Internal Medicine | Admitting: Physical Therapy

## 2018-12-11 ENCOUNTER — Encounter: Payer: Self-pay | Admitting: Neurology

## 2018-12-11 ENCOUNTER — Ambulatory Visit: Payer: PPO | Admitting: Neurology

## 2018-12-11 ENCOUNTER — Encounter: Payer: Self-pay | Admitting: Physical Therapy

## 2018-12-11 VITALS — BP 130/86 | HR 95 | Ht 70.0 in | Wt 197.0 lb

## 2018-12-11 DIAGNOSIS — M545 Low back pain, unspecified: Secondary | ICD-10-CM

## 2018-12-11 DIAGNOSIS — G8929 Other chronic pain: Secondary | ICD-10-CM | POA: Insufficient documentation

## 2018-12-11 DIAGNOSIS — R2689 Other abnormalities of gait and mobility: Secondary | ICD-10-CM | POA: Insufficient documentation

## 2018-12-11 DIAGNOSIS — M6281 Muscle weakness (generalized): Secondary | ICD-10-CM | POA: Diagnosis not present

## 2018-12-11 DIAGNOSIS — G609 Hereditary and idiopathic neuropathy, unspecified: Secondary | ICD-10-CM

## 2018-12-11 NOTE — Patient Instructions (Addendum)
1.  For muscle cramps, try drinking tonic water with quinine.  Continue B12. 2.  Otherwise, follow up in one year

## 2018-12-11 NOTE — Therapy (Signed)
[facility] [address], [place], [place], [address] Phone: [phone]   Fax:  [phone]  Physical Therapy Treatment  Patient Details  Name: Maurice Wood MRN: [id number] Date of Birth: 09-Feb-1933 Referring Provider (PT): Glori Bickers, MD   Encounter Date: 12/11/2018  PT End of Session - 12/11/18 1450    Visit Number  6    Date for PT Re-Evaluation  01/14/19    Authorization Type  Medicare    PT Start Time  4854    PT Stop Time  1522    PT Time Calculation (min)  39 min    Activity Tolerance  Patient tolerated treatment well       Past Medical History:  Diagnosis Date  . Allergy    rhinitis  . Atrial fibrillation [facility]) Jan 2007   echo 1-07 normal ejection fraction, no significant valvular disease. adenosine cardiolite 1-07  no evidence of ischemia. A 48 hour Holter monitor in 7-07 showed good rate controlw/ chronic atrial fibrillation. Cath 1/11 normal cors. EF 45%. Echo 2-11 60-65%  . Benign prostatic hypertrophy   . Cerebral aneurysm    followed by Dr Estanislado Pandy  . Fatigue   . History of chest pain    a. s/p LHC 05/2014 with normal cors  . Hx of colonic polyps    diverticulosis  . Hyperlipidemia   . IBS (irritable bowel syndrome)   . Incontinence    Per pt 12/14/11  . Kidney stones   . Obesity   . Obstructive sleep apnea    noncompliant with CPAP  . Retinal vein occlusion     Past Surgical History:  Procedure Laterality Date  . CARDIAC CATHETERIZATION  1999  . CHOLECYSTECTOMY    . HERNIA REPAIR    . inguinal and umbilical herniorrhaphy    . INTERSTIM IMPLANT PLACEMENT  04/2017  . LEFT HEART CATHETERIZATION WITH CORONARY ANGIOGRAM N/A 06/01/2014   Procedure: LEFT HEART CATHETERIZATION WITH CORONARY ANGIOGRAM;  Surgeon: Peter M Martinique, MD;  [facility] C[facility] LAB;  Service: Cardiovascular;  Laterality: N/A;  . LITHOTRIPSY    . TONSILLECTOMY AND ADENOIDECTOMY  as a child    There were no  vitals filed for this visit.  Subjective Assessment - 12/11/18 1446    Subjective  I had a dizzy spell before I came and almost fell.   I was tired after last time.  I saw Dr. Tomi Likens for follow up and everything checked out OK.  I have complete neuropathy up to my knees.  That was not a change from last time.  I'm going to the orthopedist tomorrow about my shoulder.  Reaching up or out bothers it.  My back is doing OK so far.      Pertinent History  skin cancer    Diagnostic tests  CT scan- negative    Patient Stated Goals  improve strength and mobility, reduce LBP, reduce falls    Currently in Pain?  Yes    Pain Score  3     Pain Location  Shoulder    Pain Orientation  Right    Aggravating Factors   reaching up or out         [facility] PT Assessment - 12/11/18 0001      Strength   Right Hip Flexion  4+/5    Right Hip ABduction  4/5    Left Hip Flexion  4+/5    Left Hip ABduction  4/5    Right Knee  Flexion  4+/5    Right Knee Extension  4+/5    Left Knee Flexion  4+/5    Left Knee Extension  4+/5                   [facility] Adult PT Treatment/Exercise - 12/11/18 0001      Neuro Re-ed    Neuro Re-ed Details   step and reach; stepping in/out of boxes min to moderate challenges      Lumbar Exercises: Standing   Heel Raises  15 reps      Lumbar Exercises: Seated   Other Seated Lumbar Exercises  green band shoulder extensions 15x      Knee/Hip Exercises: Stretches   Active Hamstring Stretch  Right;Left;5 reps    Active Hamstring Stretch Limitations  on 2nd step     Hip Flexor Stretch  Right;Left;5 reps    Hip Flexor Stretch Limitations  on 2nd step       Knee/Hip Exercises: Aerobic   Nustep  L3 x 8'  seat 11   PT present to monitor effort, symptoms     Knee/Hip Exercises: Standing   Hip Extension  Stengthening;Right;Left;15 reps    Extension Limitations  red    Forward Step Up  Right;Left;10 reps;Hand Hold: 2;Step Height: 6"    Other Standing Knee Exercises  step  taps 10x alternating       Knee/Hip Exercises: Seated   Long Arc Quad  AROM;Strengthening;Both;1 set;15 reps    Long Arc Quad Weight  4 lbs.    Other Seated Knee/Hip Exercises  cane push down to activate transverse abdominus muscles 10x     Marching  Strengthening;Right;Left;15 reps    Marching Weights  4 lbs.    Hamstring Curl  Strengthening;Both;15 reps    Hamstring Limitations  red band    Sit to Sand  10 reps;without UE support   from chair with black foam pad; very min use of UEs on knees              PT Short Term Goals - 12/01/18 1052      PT SHORT TERM GOAL #1   Title  be independent in initial HEP    Baseline  continue to monitor    Time  4    Period  Weeks    Status  On-going      PT SHORT TERM GOAL #2   Title  perform 5x sit to stand in < or = to 12 seconds with use of hands    Time  4    Period  Weeks    Status  On-going      PT SHORT TERM GOAL #3   Title  improve LE strength to perform sit to stand x 1 without UE support    Time  4    Period  Weeks    Status  Achieved        PT Long Term Goals - 11/19/18 0347      PT LONG TERM GOAL #1   Title  be independent in advanced HEP    Time  8    Period  Weeks    Status  New    Target Date  01/14/19      PT LONG TERM GOAL #2   Title  reduce FOTO to < or = to 31% limitation    Time  8    Period  Weeks    Status  New    Target Date  01/14/19      PT LONG TERM GOAL #3   Title  perform 5x sit to stand in < or = to 14 seconds without use of arms to improve balance    Time  8    Period  Weeks    Status  New    Target Date  01/14/19      PT LONG TERM GOAL #4   Title  report < or = to 3/10 LBP at the end of the day    Time  8    Period  Weeks    Status  New    Target Date  01/14/19      PT LONG TERM GOAL #5   Title  improve LE strength to perform sit to stand without use of UE support > or = to 50% with functional mobility    Time  8    Period  Weeks    Status  New    Target Date  01/14/19             Plan - 12/11/18 1518    Clinical Impression Statement  The patient reports feeling "winded" quickly especially with standing exercises.  Some mild LBP with isometric press down which quickly dissipates.  He needs close supervision in standing for safety secondary to decreased balance.   Mild loss of balance with box stepping without UE support.  Therapist closely monitoring response with all interventions. He denies pain but reports he is fatigued.      Rehab Potential  Good    PT Frequency  2x / week    PT Duration  8 weeks    PT Treatment/Interventions  ADLs/Self Care Home Management;Cryotherapy;Moist Heat;Gait training;Stair training;Therapeutic activities;Therapeutic exercise;Patient/family education;Neuromuscular re-education;Manual techniques;Passive range of motion;Taping;Dry needling    PT Next Visit Plan  recheck progress toward goals next visit;  balance, LE strength, flexibility    PT Home Exercise Plan  Access Code: [email or web address]        Patient will benefit from skilled therapeutic intervention in order to improve the following deficits and impairments:  Abnormal gait, Pain, Impaired flexibility, Decreased safety awareness, Decreased activity tolerance, Decreased endurance, Decreased strength, Postural dysfunction, Improper body mechanics  Visit Diagnosis: Chronic bilateral low back pain without sciatica  Muscle weakness (generalized)  Other abnormalities of gait and mobility     Problem List Patient Active Problem List   Diagnosis Date Noted  . Bilateral lower extremity edema 06/02/2015  . UTI (lower urinary tract infection) 05/25/2015  . Sepsis ([place]) 05/25/2015  . Fever 05/25/2015  . Cough 05/25/2015  . Kidney stone 05/25/2015  . Hypokalemia 05/25/2015  . Acute urinary retention   . H/O cardiac catheterizationn 12/2009 with normal coronary arteries 05/30/2014  . Low back pain 12/17/2013  . Chest pain 09/10/2013  . Urinary incontinence 06/04/2011  .  Long term current use of anticoagulant 01/12/2011  . DM type 2 (diabetes mellitus, type 2) ([place]) 07/31/2010  . HEMORRHOIDS-INTERNAL 06/27/2010  . Sleep apnea 06/27/2010  . DIVERTICULOSIS OF COLON 01/03/2010  . GERD 11/22/2009  . IRRITABLE BOWEL SYNDROME 11/22/2009  . ERECTILE DYSFUNCTION 06/06/2009  . BPH (benign prostatic hyperplasia) 06/18/2008  . PSA, INCREASED 12/19/2007  . OBSTRUCTIVE SLEEP APNEA 07/24/2007  . DISSECTION, CAROTID ARTERY 07/24/2007  . TESTOSTERONE DEFICIENCY 07/23/2007  . HLD (hyperlipidemia) 07/23/2007  . COLONIC POLYPS, HX OF 07/23/2007  . Atrial fibrillation, permanent 07/14/2007   Ruben Im, PT 12/11/18 3:27 PM Phone: [phone] Fax: [phone]  Alvera Singh 12/11/2018, 3:27  PM  [facility] [address], [place], [place], [address] Phone: [phone]   Fax:  [phone]  Name: LENDELL GALLICK MRN: [id number] Date of Birth: February 01, 1933

## 2018-12-12 DIAGNOSIS — M7641 Tibial collateral bursitis [Pellegrini-Stieda], right leg: Secondary | ICD-10-CM | POA: Diagnosis not present

## 2018-12-12 DIAGNOSIS — M7541 Impingement syndrome of right shoulder: Secondary | ICD-10-CM | POA: Diagnosis not present

## 2018-12-12 DIAGNOSIS — M25511 Pain in right shoulder: Secondary | ICD-10-CM | POA: Diagnosis not present

## 2018-12-15 ENCOUNTER — Ambulatory Visit: Payer: PPO | Admitting: Pulmonary Disease

## 2018-12-15 ENCOUNTER — Encounter: Payer: Self-pay | Admitting: Pulmonary Disease

## 2018-12-15 VITALS — BP 126/76 | HR 77 | Ht 70.0 in | Wt 195.0 lb

## 2018-12-15 DIAGNOSIS — G4733 Obstructive sleep apnea (adult) (pediatric): Secondary | ICD-10-CM

## 2018-12-15 DIAGNOSIS — Z9989 Dependence on other enabling machines and devices: Secondary | ICD-10-CM

## 2018-12-15 NOTE — Patient Instructions (Signed)
Will arrange for CPAP mask refitting  Follow up in 1 year 

## 2018-12-15 NOTE — Progress Notes (Signed)
[facility]  Chief Complaint  Patient presents with  . Follow-up    wearing cpap most nights avg 5-8hr. feels pressure is okay. mask is leaking. DME:[facility]     Constitutional:  BP 126/76 (BP Location: Left Arm, Cuff Size: Normal)   Pulse 77   Ht '5\' 10"'  (1.778 m)   Wt 195 lb (88.5 kg)   SpO2 96%   BMI 27.98 kg/m   Past Medical History:  Nephrolithiasis, IBS, HLD, Colon polyps, Cerebral aneurysm, BPH, A fib, Cystoid macular degeneration  Brief Summary:  Maurice Wood is a 85 y.o. male with obstructive sleep apnea.  He has trouble with mask leak since he had a fall and injured his nose.  He has full face mask.  Leak seems to bother his wife more than him, but he does wake up sometimes when mask leaks.  Still feels tired during the day.  Pressure setting feels okay.   Physical Exam:   Appearance - well kempt   ENMT - clear nasal mucosa, midline nasal  septum, no oral exudates, no LAN, trachea midline  Respiratory - normal chest wall, normal respiratory effort, no accessory muscle use, no wheeze/rales  CV - s1s2 regular rate and rhythm, no murmurs, no peripheral edema, radial pulses symmetric  GI - soft, non tender, no masses  Lymph - no adenopathy noted in neck and axillary areas  MSK - normal gait  Ext - no cyanosis, clubbing, or joint inflammation noted  Skin - no rashes, lesions, or ulcers  Neuro - normal strength, oriented x 3  Psych - normal mood and affect   Assessment/Plan:   Obstructive sleep apnea. - he is compliant with CPAP and reports benefit - continue CPAP 12 cm H2O - will arrange for CPAP mask refitting   Patient Instructions  Will arrange for CPAP mask refitting  Follow up in 1 year    Chesley Mires, MD [place] Pager: [phone] 12/15/2018, 2:19 PM  Flow Sheet    Sleep tests:  PSG 11/09/17 >> AHI 40.8, SpO2 79% CPAP titration 03/25/18 >> CPAP 12 cm H2O >> AHI 0. CPAP  11/12/18 to 12/11/18 >> used on 21 of 30 nights with average 8 hrs 46 min.  Average AHI 2.1 with CPAP 12 cm H2O  Cardiac tests:  Echo 11/04/17 >> EF 60 to 65%  Medications:   Allergies as of 12/15/2018      Reactions   Epinephrine Palpitations   REACTION: Increased Heart Rate   Nsaids Other (See Comments)   NOT WHILE TAKING ELIQUIS NOT WHILE TAKING ELIQUIS   Clarithromycin Other (See Comments)   REACTION: Headache headache headache   Iodinated Diagnostic Agents Other (See Comments)   Pt states "turning bright red"   Nimodipine    REACTION: Flushing   Other    Pt states "turning bright red"      Medication List       Accurate as of December 15, 2018  2:19 PM. Always use your most recent med list.        apixaban 5 MG Tabs tablet Commonly known as:  ELIQUIS TAKE 1 TABLET (5 MG TOTAL) BY MOUTH 2 (TWO) TIMES DAILY.   azaTHIOprine 50 MG tablet Commonly known as:  IMURAN Take by mouth.   B-12 COMPLIANCE INJECTION 1000 MCG/ML Kit Generic drug:  Cyanocobalamin Inject as directed every 30 (thirty) days.   chlorpheniramine 4 MG tablet Commonly known as:  CHLOR-TRIMETON Chlor-A-Tab 4 mg tablet  Take  1 tablet every 4 hours by oral route.   folic acid 1 MG tablet Commonly known as:  FOLVITE Take by mouth.   ipratropium 0.06 % nasal spray Commonly known as:  ATROVENT Place 1 spray into both nostrils 4 (four) times daily.   omeprazole 10 MG capsule Commonly known as:  PRILOSEC Take 10 mg by mouth daily.       Past Surgical History:  He  has a past surgical history that includes Cholecystectomy; inguinal and umbilical herniorrhaphy; Tonsillectomy and adenoidectomy (as a child); Cardiac catheterization (1999); Hernia repair; left heart catheterization with coronary angiogram (N/A, 06/01/2014); Lithotripsy; and Interstim Implant placement (04/2017).  Family History:  His family history includes Alcohol abuse in his father; Aneurysm in his father; Cancer in his father and  maternal grandmother; Diabetes in his maternal uncle, paternal uncle, and son; Heart disease in his paternal grandfather; Sudden death in his mother.  Social History:  He  reports that he quit smoking about 47 years ago. His smoking use included cigarettes. He has a 20.00 pack-year smoking history. He has never used smokeless tobacco. He reports current alcohol use of about 1.0 standard drinks of alcohol per week. He reports that he does not use drugs.

## 2018-12-16 ENCOUNTER — Ambulatory Visit: Payer: PPO | Admitting: Physical Therapy

## 2018-12-18 ENCOUNTER — Ambulatory Visit: Payer: PPO | Admitting: Physical Therapy

## 2018-12-25 ENCOUNTER — Encounter: Payer: Self-pay | Admitting: Physical Therapy

## 2018-12-25 ENCOUNTER — Ambulatory Visit: Payer: PPO | Admitting: Physical Therapy

## 2018-12-25 DIAGNOSIS — M545 Low back pain: Principal | ICD-10-CM

## 2018-12-25 DIAGNOSIS — M6281 Muscle weakness (generalized): Secondary | ICD-10-CM

## 2018-12-25 DIAGNOSIS — G8929 Other chronic pain: Secondary | ICD-10-CM

## 2018-12-25 DIAGNOSIS — R2689 Other abnormalities of gait and mobility: Secondary | ICD-10-CM

## 2018-12-25 NOTE — Therapy (Signed)
[facility] [address] [place], [place], [address] Phone: [phone]   Fax:  [phone]  Physical Therapy Treatment  Patient Details  Name: Maurice Wood MRN: [id number] Date of Birth: November 12, 1933 Referring Provider (PT): Glori Bickers, MD   Encounter Date: 12/25/2018  PT End of Session - 12/25/18 1246    Visit Number  7    Date for PT Re-Evaluation  01/14/19    Authorization Type  Medicare    PT Start Time  1233    PT Stop Time  1315    PT Time Calculation (min)  42 min    Activity Tolerance  Patient limited by fatigue;Patient tolerated treatment well       Past Medical History:  Diagnosis Date  . Allergy    rhinitis  . Atrial fibrillation [facility]) Jan 2007   echo 1-07 normal ejection fraction, no significant valvular disease. adenosine cardiolite 1-07  no evidence of ischemia. A 48 hour Holter monitor in 7-07 showed good rate controlw/ chronic atrial fibrillation. Cath 1/11 normal cors. EF 45%. Echo 2-11 60-65%  . Benign prostatic hypertrophy   . Cerebral aneurysm    followed by Dr Estanislado Pandy  . Fatigue   . History of chest pain    a. s/p LHC 05/2014 with normal cors  . Hx of colonic polyps    diverticulosis  . Hyperlipidemia   . IBS (irritable bowel syndrome)   . Incontinence    Per pt 12/14/11  . Kidney stones   . Obesity   . Obstructive sleep apnea    noncompliant with CPAP  . Retinal vein occlusion     Past Surgical History:  Procedure Laterality Date  . CARDIAC CATHETERIZATION  1999  . CHOLECYSTECTOMY    . HERNIA REPAIR    . inguinal and umbilical herniorrhaphy    . INTERSTIM IMPLANT PLACEMENT  04/2017  . LEFT HEART CATHETERIZATION WITH CORONARY ANGIOGRAM N/A 06/01/2014   Procedure: LEFT HEART CATHETERIZATION WITH CORONARY ANGIOGRAM;  Surgeon: Peter M Martinique, MD;  Location: [facility] CATH LAB;  Service: Cardiovascular;  Laterality: N/A;  . LITHOTRIPSY    . TONSILLECTOMY AND ADENOIDECTOMY  as a  child    There were no vitals filed for this visit.  Subjective Assessment - 12/25/18 1235    Subjective  I've been sick for 10 days (upper respiratory with fever).  Some back pain but it may be from coughing.  Balance has been reasonably good but I've stumbled a few times.      Patient Stated Goals  improve strength and mobility, reduce LBP, reduce falls    Currently in Pain?  Yes    Pain Score  7     Pain Location  Back    Pain Type  Chronic pain         [facility] PT Assessment - 12/25/18 0001      Standardized Balance Assessment   Five times sit to stand comments   13.22 with cane                   [facility] Adult PT Treatment/Exercise - 12/25/18 0001      Lumbar Exercises: Stretches   Other Lumbar Stretch Exercise  wall climbs 10x    Other Lumbar Stretch Exercise  wall pull aways 10x      Lumbar Exercises: Standing   Other Standing Lumbar Exercises  hip isometric against the wall 5x right/left       Knee/Hip Exercises: Stretches  Hip Flexor Stretch  Right;Left;5 reps    Hip Flexor Stretch Limitations  on 2nd step       Knee/Hip Exercises: Aerobic   Nustep  L1 x 8'  seat 11   PT present to monitor effort, symptoms; dec resistance today     Knee/Hip Exercises: Standing   Heel Raises  10 reps    Forward Step Up  Right;Left;10 reps;Hand Hold: 2;Step Height: 6"    Walking with Sports Cord  green band resisted walking forward and backward 20x    Other Standing Knee Exercises  hip extension with red band around thighs 10x right/left     Other Standing Knee Exercises  hip abduction alternationg with red band around thighs 15x      Knee/Hip Exercises: Seated   Sit to Sand  10 reps;without UE support   from chair with black foam pad; very min use of UEs on knees              PT Short Term Goals - 12/25/18 1248      PT SHORT TERM GOAL #1   Title  be independent in initial HEP    Status  Achieved      PT SHORT TERM GOAL #2   Title  perform 5x sit to  stand in < or = to 12 seconds with use of hands    Baseline  13 sec    Time  4    Period  Weeks    Status  On-going      PT SHORT TERM GOAL #3   Title  improve LE strength to perform sit to stand x 1 without UE support    Status  Achieved      PT SHORT TERM GOAL #4   Title  report < or = to 4/10 LBP at the end of the day    Time  4    Period  Weeks    Status  On-going        PT Long Term Goals - 11/19/18 4782      PT LONG TERM GOAL #1   Title  be independent in advanced HEP    Time  8    Period  Weeks    Status  New    Target Date  01/14/19      PT LONG TERM GOAL #2   Title  reduce FOTO to < or = to 31% limitation    Time  8    Period  Weeks    Status  New    Target Date  01/14/19      PT LONG TERM GOAL #3   Title  perform 5x sit to stand in < or = to 14 seconds without use of arms to improve balance    Time  8    Period  Weeks    Status  New    Target Date  01/14/19      PT LONG TERM GOAL #4   Title  report < or = to 3/10 LBP at the end of the day    Time  8    Period  Weeks    Status  New    Target Date  01/14/19      PT LONG TERM GOAL #5   Title  improve LE strength to perform sit to stand without use of UE support > or = to 50% with functional mobility    Time  8    Period  Weeks  Status  New    Target Date  01/14/19            Plan - 12/25/18 1307    Clinical Impression Statement  The patient returns after being ill for 10 days.  He becomes short of breath with Nu-Step and with sit to stand today and needs several rest breaks every 2-3 minutes.   Some short term goals not met secondary to recent illness and increased LBP compared to previous visits.   Therapist closely monitoring response and modifying as needed.  He reports his back pain is a little better following treatment session.  Therapist providing CGA in standing for safety.     Rehab Potential  Good    PT Frequency  2x / week    PT Duration  8 weeks    PT Treatment/Interventions   ADLs/Self Care Home Management;Cryotherapy;Moist Heat;Gait training;Stair training;Therapeutic activities;Therapeutic exercise;Patient/family education;Neuromuscular re-education;Manual techniques;Passive range of motion;Taping;Dry needling    PT Next Visit Plan    balance, LE strength, flexibility;  recheck FOTO     PT Home Exercise Plan  Access Code: [email or web address]        Patient will benefit from skilled therapeutic intervention in order to improve the following deficits and impairments:  Abnormal gait, Pain, Impaired flexibility, Decreased safety awareness, Decreased activity tolerance, Decreased endurance, Decreased strength, Postural dysfunction, Improper body mechanics  Visit Diagnosis: Chronic bilateral low back pain without sciatica  Muscle weakness (generalized)  Other abnormalities of gait and mobility     Problem List Patient Active Problem List   Diagnosis Date Noted  . Bilateral lower extremity edema 06/02/2015  . UTI (lower urinary tract infection) 05/25/2015  . Sepsis ([place]) 05/25/2015  . Fever 05/25/2015  . Cough 05/25/2015  . Kidney stone 05/25/2015  . Hypokalemia 05/25/2015  . Acute urinary retention   . H/O cardiac catheterizationn 12/2009 with normal coronary arteries 05/30/2014  . Low back pain 12/17/2013  . Chest pain 09/10/2013  . Urinary incontinence 06/04/2011  . Long term current use of anticoagulant 01/12/2011  . DM type 2 (diabetes mellitus, type 2) ([place]) 07/31/2010  . HEMORRHOIDS-INTERNAL 06/27/2010  . Sleep apnea 06/27/2010  . DIVERTICULOSIS OF COLON 01/03/2010  . GERD 11/22/2009  . IRRITABLE BOWEL SYNDROME 11/22/2009  . ERECTILE DYSFUNCTION 06/06/2009  . BPH (benign prostatic hyperplasia) 06/18/2008  . PSA, INCREASED 12/19/2007  . OBSTRUCTIVE SLEEP APNEA 07/24/2007  . DISSECTION, CAROTID ARTERY 07/24/2007  . TESTOSTERONE DEFICIENCY 07/23/2007  . HLD (hyperlipidemia) 07/23/2007  . COLONIC POLYPS, HX OF 07/23/2007  . Atrial fibrillation, permanent  07/14/2007   Ruben Im, PT 12/25/18 1:15 PM Phone: [phone] Fax: [phone] Alvera Singh 12/25/2018, 1:13 PM  [facility] [address], [place], [place], [address] Phone: [phone]   Fax:  [phone]  Name: Maurice Wood MRN: [id number] Date of Birth: September 16, 1933

## 2018-12-29 ENCOUNTER — Telehealth: Payer: Self-pay | Admitting: Physical Therapy

## 2018-12-29 ENCOUNTER — Ambulatory Visit: Payer: PPO | Admitting: Physical Therapy

## 2018-12-29 DIAGNOSIS — R7303 Prediabetes: Secondary | ICD-10-CM | POA: Diagnosis not present

## 2018-12-29 DIAGNOSIS — E538 Deficiency of other specified B group vitamins: Secondary | ICD-10-CM | POA: Diagnosis not present

## 2018-12-29 DIAGNOSIS — I482 Chronic atrial fibrillation, unspecified: Secondary | ICD-10-CM | POA: Diagnosis not present

## 2018-12-29 DIAGNOSIS — G4730 Sleep apnea, unspecified: Secondary | ICD-10-CM | POA: Diagnosis not present

## 2018-12-29 DIAGNOSIS — R197 Diarrhea, unspecified: Secondary | ICD-10-CM | POA: Diagnosis not present

## 2018-12-29 DIAGNOSIS — G6289 Other specified polyneuropathies: Secondary | ICD-10-CM | POA: Diagnosis not present

## 2018-12-29 DIAGNOSIS — H349 Unspecified retinal vascular occlusion: Secondary | ICD-10-CM | POA: Diagnosis not present

## 2018-12-29 DIAGNOSIS — N3281 Overactive bladder: Secondary | ICD-10-CM | POA: Diagnosis not present

## 2018-12-29 NOTE — Telephone Encounter (Signed)
Left a message on home phone about no show to today's appointment.  Reminded him about next appointment this Thursday and asked him to call back to confirm.   Lashay Osborne, PT 12/29/18 12:20 PM

## 2019-01-01 ENCOUNTER — Ambulatory Visit: Payer: PPO | Admitting: Physical Therapy

## 2019-01-01 DIAGNOSIS — M545 Low back pain: Secondary | ICD-10-CM | POA: Diagnosis not present

## 2019-01-01 DIAGNOSIS — G8929 Other chronic pain: Secondary | ICD-10-CM

## 2019-01-01 DIAGNOSIS — M6281 Muscle weakness (generalized): Secondary | ICD-10-CM

## 2019-01-01 DIAGNOSIS — R2689 Other abnormalities of gait and mobility: Secondary | ICD-10-CM

## 2019-01-01 NOTE — Therapy (Signed)
[facility] [address], [place], [place], [address] Phone: [phone]   Fax:  [phone]  Physical Therapy Treatment  Patient Details  Name: Maurice Wood MRN: [id number] Date of Birth: 04-03-1933 Referring Provider (PT): Glori Bickers, MD   Encounter Date: 01/01/2019  PT End of Session - 01/01/19 0921    Visit Number  8    Date for PT Re-Evaluation  01/14/19    Authorization Type  Medicare    PT Start Time  0916    PT Stop Time  1000    PT Time Calculation (min)  44 min    Activity Tolerance  Patient limited by fatigue;Patient tolerated treatment well       Past Medical History:  Diagnosis Date  . Allergy    rhinitis  . Atrial fibrillation [facility]) Jan 2007   echo 1-07 normal ejection fraction, no significant valvular disease. adenosine cardiolite 1-07  no evidence of ischemia. A 48 hour Holter monitor in 7-07 showed good rate controlw/ chronic atrial fibrillation. Cath 1/11 normal cors. EF 45%. Echo 2-11 60-65%  . Benign prostatic hypertrophy   . Cerebral aneurysm    followed by Dr Estanislado Pandy  . Fatigue   . History of chest pain    a. s/p LHC 05/2014 with normal cors  . Hx of colonic polyps    diverticulosis  . Hyperlipidemia   . IBS (irritable bowel syndrome)   . Incontinence    Per pt 12/14/11  . Kidney stones   . Obesity   . Obstructive sleep apnea    noncompliant with CPAP  . Retinal vein occlusion     Past Surgical History:  Procedure Laterality Date  . CARDIAC CATHETERIZATION  1999  . CHOLECYSTECTOMY    . HERNIA REPAIR    . inguinal and umbilical herniorrhaphy    . INTERSTIM IMPLANT PLACEMENT  04/2017  . LEFT HEART CATHETERIZATION WITH CORONARY ANGIOGRAM N/A 06/01/2014   Procedure: LEFT HEART CATHETERIZATION WITH CORONARY ANGIOGRAM;  Surgeon: Peter M Martinique, MD;  Location: [facility] CATH LAB;  Service: Cardiovascular;  Laterality: N/A;  . LITHOTRIPSY    . TONSILLECTOMY AND ADENOIDECTOMY  as a  child    There were no vitals filed for this visit.  Subjective Assessment - 01/01/19 0931    Subjective  This illness is just lingering.   My back is creaky but not painful.     Currently in Pain?  No/denies    Pain Score  0-No pain    Pain Location  Back    Pain Type  Chronic pain    Aggravating Factors   nothing I can think of offhand         [facility] PT Assessment - 01/01/19 0001      Observation/Other Assessments   Focus on Therapeutic Outcomes (FOTO)   27% limitation                    [facility] Adult PT Treatment/Exercise - 01/01/19 0001      Knee/Hip Exercises: Aerobic   Nustep  L2 84min seat 11 while discussing progress      Knee/Hip Exercises: Machines for Strengthening   Cybex Leg Press  seat 9 60# bil;  single leg 40# all 15x each       Knee/Hip Exercises: Standing   Heel Raises  10 reps    Hip Abduction  Stengthening;Right;Left;10 reps    Abduction Limitations  red band     Hip Extension  Stengthening;Right;Left;15  reps    Extension Limitations  red    Forward Step Up  Right;Left;10 reps;Hand Hold: 2;Step Height: 6"    Rebounder  3 way weight shift 1 min each with bil UE support     Walking with Sports Cord  green band resisted walking forward and backward 20x      Knee/Hip Exercises: Seated   Clamshell with TheraBand  Blue   double and single legs              PT Short Term Goals - 01/01/19 0949      PT SHORT TERM GOAL #1   Title  be independent in initial HEP    Status  Achieved      PT SHORT TERM GOAL #2   Title  perform 5x sit to stand in < or = to 12 seconds with use of hands    Time  4    Period  Weeks    Status  On-going      PT SHORT TERM GOAL #3   Title  improve LE strength to perform sit to stand x 1 without UE support    Status  Achieved      PT SHORT TERM GOAL #4   Title  report < or = to 4/10 LBP at the end of the day    Status  Achieved        PT Long Term Goals - 01/01/19 0950      PT LONG TERM GOAL #1    Title  be independent in advanced HEP    Time  8    Period  Weeks    Status  On-going      PT LONG TERM GOAL #2   Title  reduce FOTO to < or = to 31% limitation    Status  Achieved      PT LONG TERM GOAL #3   Title  perform 5x sit to stand in < or = to 14 seconds without use of arms to improve balance    Time  8    Period  Weeks    Status  On-going      PT LONG TERM GOAL #4   Title  report < or = to 3/10 LBP at the end of the day    Status  Achieved      PT LONG TERM GOAL #5   Title  improve LE strength to perform sit to stand without use of UE support > or = to 50% with functional mobility    Time  8    Period  Weeks    Status  On-going            Plan - 01/01/19 0949    Clinical Impression Statement  The patient's stamina following recent illness continues to improve.  Overall his LBP has significantly improved since start of care with FOTO functional outcome score improved from 37% limitation to 27%.  He does have some low back discomfort with resisted hip abduction which quickly dissipates following completion of ex.  Progressing with rehab goals.  Therapist closely monitoring response with all treatment interventions.      PT Frequency  2x / week    PT Duration  8 weeks    PT Treatment/Interventions  ADLs/Self Care Home Management;Cryotherapy;Moist Heat;Gait training;Stair training;Therapeutic activities;Therapeutic exercise;Patient/family education;Neuromuscular re-education;Manual techniques;Passive range of motion;Taping;Dry needling    PT Next Visit Plan    balance, LE strength, flexibility;  recheck 5x sit to stand test  Patient will benefit from skilled therapeutic intervention in order to improve the following deficits and impairments:  Abnormal gait, Pain, Impaired flexibility, Decreased safety awareness, Decreased activity tolerance, Decreased endurance, Decreased strength, Postural dysfunction, Improper body mechanics  Visit Diagnosis: Chronic bilateral  low back pain without sciatica  Muscle weakness (generalized)  Other abnormalities of gait and mobility     Problem List Patient Active Problem List   Diagnosis Date Noted  . Bilateral lower extremity edema 06/02/2015  . UTI (lower urinary tract infection) 05/25/2015  . Sepsis ([place]) 05/25/2015  . Fever 05/25/2015  . Cough 05/25/2015  . Kidney stone 05/25/2015  . Hypokalemia 05/25/2015  . Acute urinary retention   . H/O cardiac catheterizationn 12/2009 with normal coronary arteries 05/30/2014  . Low back pain 12/17/2013  . Chest pain 09/10/2013  . Urinary incontinence 06/04/2011  . Long term current use of anticoagulant 01/12/2011  . DM type 2 (diabetes mellitus, type 2) ([place]) 07/31/2010  . HEMORRHOIDS-INTERNAL 06/27/2010  . Sleep apnea 06/27/2010  . DIVERTICULOSIS OF COLON 01/03/2010  . GERD 11/22/2009  . IRRITABLE BOWEL SYNDROME 11/22/2009  . ERECTILE DYSFUNCTION 06/06/2009  . BPH (benign prostatic hyperplasia) 06/18/2008  . PSA, INCREASED 12/19/2007  . OBSTRUCTIVE SLEEP APNEA 07/24/2007  . DISSECTION, CAROTID ARTERY 07/24/2007  . TESTOSTERONE DEFICIENCY 07/23/2007  . HLD (hyperlipidemia) 07/23/2007  . COLONIC POLYPS, HX OF 07/23/2007  . Atrial fibrillation, permanent 07/14/2007   Ruben Im, PT 01/01/19 5:41 PM Phone: [phone] Fax: [phone] Alvera Singh 01/01/2019, 5:40 PM  [facility] [address], [place], [place], [address] Phone: [phone]   Fax:  [phone]  Name: Maurice Wood MRN: [id number] Date of Birth: [birth year]

## 2019-01-05 ENCOUNTER — Encounter: Payer: PPO | Admitting: Physical Therapy

## 2019-01-08 ENCOUNTER — Encounter: Payer: Self-pay | Admitting: Physical Therapy

## 2019-01-08 ENCOUNTER — Ambulatory Visit: Payer: PPO | Admitting: Physical Therapy

## 2019-01-08 DIAGNOSIS — M545 Low back pain: Principal | ICD-10-CM

## 2019-01-08 DIAGNOSIS — M6281 Muscle weakness (generalized): Secondary | ICD-10-CM

## 2019-01-08 DIAGNOSIS — G8929 Other chronic pain: Secondary | ICD-10-CM

## 2019-01-08 DIAGNOSIS — R2689 Other abnormalities of gait and mobility: Secondary | ICD-10-CM

## 2019-01-08 NOTE — Therapy (Signed)
[facility] [address], [place], [place], [address] Phone: [phone]   Fax:  [phone]  Physical Therapy Treatment  Patient Details  Name: Maurice Wood MRN: [id number] Date of Birth: 1933-07-12 Referring Provider (PT): Glori Bickers, MD   Encounter Date: 01/08/2019  PT End of Session - 01/08/19 0919    Visit Number  9    Date for PT Re-Evaluation  01/14/19    Authorization Type  Medicare    PT Start Time  0843    PT Stop Time  0925    PT Time Calculation (min)  42 min    Activity Tolerance  Patient tolerated treatment well       Past Medical History:  Diagnosis Date  . Allergy    rhinitis  . Atrial fibrillation [facility]) Jan 2007   echo 1-07 normal ejection fraction, no significant valvular disease. adenosine cardiolite 1-07  no evidence of ischemia. A 48 hour Holter monitor in 7-07 showed good rate controlw/ chronic atrial fibrillation. Cath 1/11 normal cors. EF 45%. Echo 2-11 60-65%  . Benign prostatic hypertrophy   . Cerebral aneurysm    followed by Dr Estanislado Pandy  . Fatigue   . History of chest pain    a. s/p LHC 05/2014 with normal cors  . Hx of colonic polyps    diverticulosis  . Hyperlipidemia   . IBS (irritable bowel syndrome)   . Incontinence    Per pt 12/14/11  . Kidney stones   . Obesity   . Obstructive sleep apnea    noncompliant with CPAP  . Retinal vein occlusion     Past Surgical History:  Procedure Laterality Date  . CARDIAC CATHETERIZATION  1999  . CHOLECYSTECTOMY    . HERNIA REPAIR    . inguinal and umbilical herniorrhaphy    . INTERSTIM IMPLANT PLACEMENT  04/2017  . LEFT HEART CATHETERIZATION WITH CORONARY ANGIOGRAM N/A 06/01/2014   Procedure: LEFT HEART CATHETERIZATION WITH CORONARY ANGIOGRAM;  Surgeon: Peter M Martinique, MD;  Location: [facility] CATH LAB;  Service: Cardiovascular;  Laterality: N/A;  . LITHOTRIPSY    . TONSILLECTOMY AND ADENOIDECTOMY  as a child    There were no  vitals filed for this visit.  Subjective Assessment - 01/08/19 0847    Subjective  I'm still getting over this head cold.  It's better.  My back feels alright this morning.  I about fell going out the back door last night.  I took some funny steps.  I think it's the neuropathy.  I don't have any feeling below my knees.      Currently in Pain?  No/denies    Pain Score  0-No pain    Pain Location  Back         [facility] PT Assessment - 01/08/19 0001      Standardized Balance Assessment   Five times sit to stand comments   11.36 with UE use; unable to do without UE use                    [facility] Adult PT Treatment/Exercise - 01/08/19 0001      Lumbar Exercises: Standing   Heel Raises  15 reps      Knee/Hip Exercises: Stretches   Hip Flexor Stretch  Right;Left;5 reps    Hip Flexor Stretch Limitations  on 2nd step     Other Knee/Hip Stretches  calf stretch on slant board 10x right/left       Knee/Hip  Exercises: Aerobic   Nustep  L2 40mn seat 11 while discussing progress      Knee/Hip Exercises: Machines for Strengthening   Cybex Leg Press  seat 9 65# bil;  single leg 40# all 15x each       Knee/Hip Exercises: Standing   Heel Raises  10 reps    Hip Abduction  Stengthening;Right;Left;15 reps    Abduction Limitations  red band     Hip Extension  Stengthening;Right;Left;15 reps    Extension Limitations  red    Rebounder  3 way weight shift 1 min each with bil UE support     Other Standing Knee Exercises  green band rows 15x               PT Short Term Goals - 01/08/19 1028      PT SHORT TERM GOAL #1   Title  be independent in initial HEP    Status  Achieved      PT SHORT TERM GOAL #2   Title  perform 5x sit to stand in < or = to 12 seconds with use of hands    Status  Achieved      PT SHORT TERM GOAL #3   Title  improve LE strength to perform sit to stand x 1 without UE support    Status  Achieved      PT SHORT TERM GOAL #4   Title  report < or = to  4/10 LBP at the end of the day    Status  Achieved        PT Long Term Goals - 01/08/19 1029      PT LONG TERM GOAL #1   Title  be independent in advanced HEP    Time  8    Period  Weeks    Status  On-going      PT LONG TERM GOAL #2   Title  reduce FOTO to < or = to 31% limitation    Status  Achieved      PT LONG TERM GOAL #3   Title  perform 5x sit to stand in < or = to 14 seconds without use of arms to improve balance    Time  8    Period  Weeks    Status  On-going      PT LONG TERM GOAL #4   Title  report < or = to 3/10 LBP at the end of the day    Status  Achieved      PT LONG TERM GOAL #5   Title  improve LE strength to perform sit to stand without use of UE support > or = to 50% with functional mobility    Time  8    Period  Weeks    Status  On-going            Plan - 01/08/19 0920    Clinical Impression Statement  Good improvement in speed with 5x sit to stand test with use of UEs.  He is unable to do repeated sit to stands today without using his arms to assist.   He reports feeling "winded" at times since his recent illness but improves quickly with short rest breaks.  He does not complain of LBP during treatment session.  Therapist closely monitoring response with all treatment interventions.  He reports continued compliance with his HEP on a regular basis and expresses readiness for discharge next visit from PT.  Rehab Potential  Good    PT Frequency  2x / week    PT Duration  8 weeks    PT Treatment/Interventions  ADLs/Self Care Home Management;Cryotherapy;Moist Heat;Gait training;Stair training;Therapeutic activities;Therapeutic exercise;Patient/family education;Neuromuscular re-education;Manual techniques;Passive range of motion;Taping;Dry needling    PT Next Visit Plan  plan for discharge next visit;  check remaining goals; review HEP and add any additional as needed;  FOTO done last week and goal has been met    PT Home Exercise Plan  Access Code:  [email or web address]        Patient will benefit from skilled therapeutic intervention in order to improve the following deficits and impairments:  Abnormal gait, Pain, Impaired flexibility, Decreased safety awareness, Decreased activity tolerance, Decreased endurance, Decreased strength, Postural dysfunction, Improper body mechanics  Visit Diagnosis: Chronic bilateral low back pain without sciatica  Muscle weakness (generalized)  Other abnormalities of gait and mobility     Problem List Patient Active Problem List   Diagnosis Date Noted  . Bilateral lower extremity edema 06/02/2015  . UTI (lower urinary tract infection) 05/25/2015  . Sepsis ([place]) 05/25/2015  . Fever 05/25/2015  . Cough 05/25/2015  . Kidney stone 05/25/2015  . Hypokalemia 05/25/2015  . Acute urinary retention   . H/O cardiac catheterizationn 12/2009 with normal coronary arteries 05/30/2014  . Low back pain 12/17/2013  . Chest pain 09/10/2013  . Urinary incontinence 06/04/2011  . Long term current use of anticoagulant 01/12/2011  . DM type 2 (diabetes mellitus, type 2) ([place]) 07/31/2010  . HEMORRHOIDS-INTERNAL 06/27/2010  . Sleep apnea 06/27/2010  . DIVERTICULOSIS OF COLON 01/03/2010  . GERD 11/22/2009  . IRRITABLE BOWEL SYNDROME 11/22/2009  . ERECTILE DYSFUNCTION 06/06/2009  . BPH (benign prostatic hyperplasia) 06/18/2008  . PSA, INCREASED 12/19/2007  . OBSTRUCTIVE SLEEP APNEA 07/24/2007  . DISSECTION, CAROTID ARTERY 07/24/2007  . TESTOSTERONE DEFICIENCY 07/23/2007  . HLD (hyperlipidemia) 07/23/2007  . COLONIC POLYPS, HX OF 07/23/2007  . Atrial fibrillation, permanent 07/14/2007   Ruben Im, PT 01/08/19 10:34 AM Phone: [phone] Fax: [phone] Alvera Singh 01/08/2019, 10:32 AM  [facility] [address] [place], [place], [address] Phone: [phone]   Fax:  [phone]  Name: CODEN FRANCHI MRN: [id number] Date of Birth:  May 11, 1933

## 2019-01-10 DIAGNOSIS — G4733 Obstructive sleep apnea (adult) (pediatric): Secondary | ICD-10-CM | POA: Diagnosis not present

## 2019-01-10 DIAGNOSIS — A419 Sepsis, unspecified organism: Secondary | ICD-10-CM | POA: Diagnosis not present

## 2019-01-12 ENCOUNTER — Ambulatory Visit: Payer: PPO | Attending: Internal Medicine | Admitting: Physical Therapy

## 2019-01-12 ENCOUNTER — Encounter: Payer: Self-pay | Admitting: Pulmonary Disease

## 2019-01-12 ENCOUNTER — Encounter: Payer: Self-pay | Admitting: Physical Therapy

## 2019-01-12 DIAGNOSIS — M545 Low back pain: Secondary | ICD-10-CM | POA: Diagnosis not present

## 2019-01-12 DIAGNOSIS — M6281 Muscle weakness (generalized): Secondary | ICD-10-CM | POA: Diagnosis not present

## 2019-01-12 DIAGNOSIS — R2689 Other abnormalities of gait and mobility: Secondary | ICD-10-CM

## 2019-01-12 DIAGNOSIS — G8929 Other chronic pain: Secondary | ICD-10-CM | POA: Diagnosis not present

## 2019-01-12 NOTE — Therapy (Signed)
[facility] [address] [place], [place], [address] Phone: [phone]   Fax:  [phone]  Physical Therapy Treatment  Progress Note Reporting Period 11/19/18 to 01/12/19  See note below for Objective Data and Assessment of Progress/Goals.       Patient Details  Name: Maurice Wood MRN: [id number] Date of Birth: [birth year] Referring Provider (PT): Glori Bickers, MD   Encounter Date: 01/12/2019  PT End of Session - 01/12/19 0852    Visit Number  10    Date for PT Re-Evaluation  01/14/19    Authorization Type  Medicare    PT Start Time  0846    PT Stop Time  0926    PT Time Calculation (min)  40 min    Activity Tolerance  Patient tolerated treatment well    Behavior During Therapy  [facility] for tasks assessed/performed       Past Medical History:  Diagnosis Date  . Allergy    rhinitis  . Atrial fibrillation [facility]) Jan 2007   echo 1-07 normal ejection fraction, no significant valvular disease. adenosine cardiolite 1-07  no evidence of ischemia. A 48 hour Holter monitor in 7-07 showed good rate controlw/ chronic atrial fibrillation. Cath 1/11 normal cors. EF 45%. Echo 2-11 60-65%  . Benign prostatic hypertrophy   . Cerebral aneurysm    followed by Dr Estanislado Pandy  . Fatigue   . History of chest pain    a. s/p LHC 05/2014 with normal cors  . Hx of colonic polyps    diverticulosis  . Hyperlipidemia   . IBS (irritable bowel syndrome)   . Incontinence    Per pt 12/14/11  . Kidney stones   . Obesity   . Obstructive sleep apnea    noncompliant with CPAP  . Retinal vein occlusion     Past Surgical History:  Procedure Laterality Date  . CARDIAC CATHETERIZATION  1999  . CHOLECYSTECTOMY    . HERNIA REPAIR    . inguinal and umbilical herniorrhaphy    . INTERSTIM IMPLANT PLACEMENT  04/2017  . LEFT HEART CATHETERIZATION WITH CORONARY ANGIOGRAM N/A 06/01/2014   Procedure: LEFT HEART CATHETERIZATION WITH CORONARY  ANGIOGRAM;  Surgeon: Peter M Martinique, MD;  Location: [facility] CATH LAB;  Service: Cardiovascular;  Laterality: N/A;  . LITHOTRIPSY    . TONSILLECTOMY AND ADENOIDECTOMY  as a child    There were no vitals filed for this visit.  Subjective Assessment - 01/12/19 0849    Subjective  Pt feels he has improved more than 50% with regards to strength/balance since starting PT.  He is ready for d/c today and feels good about his HEP.    Pertinent History  skin cancer    Diagnostic tests  CT scan- negative    Patient Stated Goals  improve strength and mobility, reduce LBP, reduce falls    Currently in Pain?  No/denies         [facility] PT Assessment - 01/12/19 0001      Assessment   Medical Diagnosis  low back pain, atrial fibrillation    Referring Provider (PT)  Glori Bickers, MD    Onset Date/Surgical Date  05/20/18    Next MD Visit  6 months    Prior Therapy  none      Observation/Other Assessments   Focus on Therapeutic Outcomes (FOTO)   27%      Strength   Overall Strength Comments  grossly 4+/5 bil LEs      Flexibility  Soft Tissue Assessment /Muscle Length  --   WFL bil                  [facility] Adult PT Treatment/Exercise - 01/12/19 0001      Exercises   Exercises  Lumbar;Knee/Hip      Lumbar Exercises: Stretches   Active Hamstring Stretch  Right;Left;2 reps;30 seconds    Piriformis Stretch  Right;Left;30 seconds;2 reps      Knee/Hip Exercises: Standing   Heel Raises  20 reps;Both    Forward Step Up  10 reps;Both;Hand Hold: 2      Knee/Hip Exercises: Seated   Long Arc Quad  Strengthening;Both;15 reps    Long Arc Quad Limitations  red band around ankles    Marching  Strengthening;20 reps    Marching Limitations  red band around thighs    Hamstring Curl  Strengthening;Both;15 reps    Hamstring Limitations  red band around ankles    Sit to [employer]  10 reps;with UE support               PT Short Term Goals - 01/08/19 1028      PT SHORT TERM GOAL #1    Title  be independent in initial HEP    Status  Achieved      PT SHORT TERM GOAL #2   Title  perform 5x sit to stand in < or = to 12 seconds with use of hands    Status  Achieved      PT SHORT TERM GOAL #3   Title  improve LE strength to perform sit to stand x 1 without UE support    Status  Achieved      PT SHORT TERM GOAL #4   Title  report < or = to 4/10 LBP at the end of the day    Status  Achieved        PT Long Term Goals - 01/12/19 0853      PT LONG TERM GOAL #1   Title  be independent in advanced HEP    Status  Achieved      PT LONG TERM GOAL #2   Title  reduce FOTO to < or = to 31% limitation    Status  Achieved   down to 27%     PT LONG TERM GOAL #3   Title  perform 5x sit to stand in < or = to 14 seconds without use of arms to improve balance    Status  Achieved   12 sec     PT LONG TERM GOAL #4   Title  report < or = to 3/10 LBP at the end of the day    Status  Achieved      PT LONG TERM GOAL #5   Title  improve LE strength to perform sit to stand without use of UE support > or = to 50% with functional mobility    Status  Partially Met   prefers use of bil UEs on chair rails           Plan - 01/12/19 0913    Clinical Impression Statement  Pt is ready for D/C to HEP today.  Pt reports feeling >50% more confident in strength and balance with PT.  He met almost all LTGs with exception of still needing UEs to perform sit to stand.  He performed 5x sit to stand using bil UEs in 12 sec without LOB.  He demonstrated HEP with min  cueing by PT to focus on eccentric control of all LE exercises.  Pt will be d/c'd today to HEP with f/u as needed.    Rehab Potential  Good    PT Frequency  2x / week    PT Duration  8 weeks    PT Treatment/Interventions  ADLs/Self Care Home Management;Cryotherapy;Moist Heat;Gait training;Stair training;Therapeutic activities;Therapeutic exercise;Patient/family education;Neuromuscular re-education;Manual techniques;Passive range of  motion;Taping;Dry needling    PT Next Visit Plan  d/c to HEP    PT Home Exercise Plan  Access Code: [email or web address]     Consulted and Agree with Plan of Care  Patient       Patient will benefit from skilled therapeutic intervention in order to improve the following deficits and impairments:  Abnormal gait, Pain, Impaired flexibility, Decreased safety awareness, Decreased activity tolerance, Decreased endurance, Decreased strength, Postural dysfunction, Improper body mechanics  Visit Diagnosis: Chronic bilateral low back pain without sciatica  Muscle weakness (generalized)  Other abnormalities of gait and mobility     Problem List Patient Active Problem List   Diagnosis Date Noted  . Bilateral lower extremity edema 06/02/2015  . UTI (lower urinary tract infection) 05/25/2015  . Sepsis ([place]) 05/25/2015  . Fever 05/25/2015  . Cough 05/25/2015  . Kidney stone 05/25/2015  . Hypokalemia 05/25/2015  . Acute urinary retention   . H/O cardiac catheterizationn 12/2009 with normal coronary arteries 05/30/2014  . Low back pain 12/17/2013  . Chest pain 09/10/2013  . Urinary incontinence 06/04/2011  . Long term current use of anticoagulant 01/12/2011  . DM type 2 (diabetes mellitus, type 2) ([place]) 07/31/2010  . HEMORRHOIDS-INTERNAL 06/27/2010  . Sleep apnea 06/27/2010  . DIVERTICULOSIS OF COLON 01/03/2010  . GERD 11/22/2009  . IRRITABLE BOWEL SYNDROME 11/22/2009  . ERECTILE DYSFUNCTION 06/06/2009  . BPH (benign prostatic hyperplasia) 06/18/2008  . PSA, INCREASED 12/19/2007  . OBSTRUCTIVE SLEEP APNEA 07/24/2007  . DISSECTION, CAROTID ARTERY 07/24/2007  . TESTOSTERONE DEFICIENCY 07/23/2007  . HLD (hyperlipidemia) 07/23/2007  . COLONIC POLYPS, HX OF 07/23/2007  . Atrial fibrillation, permanent 07/14/2007    PHYSICAL THERAPY DISCHARGE SUMMARY  Visits from Start of Care: 10  Current functional level related to goals / functional outcomes: See above   Remaining deficits: See above    Education / Equipment: HEP Plan: Patient agrees to discharge.  Patient goals were met. Patient is being discharged due to meeting the stated rehab goals.  ?????        Baruch Merl, PT 01/12/19 9:18 AM    [facility] [address] [place], [place], [address]    Phone: [phone]   Fax:  [phone]  Name: Maurice Wood MRN: [id number] Date of Birth: July 13, 1933

## 2019-01-13 DIAGNOSIS — N3941 Urge incontinence: Secondary | ICD-10-CM | POA: Diagnosis not present

## 2019-01-13 DIAGNOSIS — R159 Full incontinence of feces: Secondary | ICD-10-CM | POA: Diagnosis not present

## 2019-01-14 DIAGNOSIS — M19011 Primary osteoarthritis, right shoulder: Secondary | ICD-10-CM | POA: Diagnosis not present

## 2019-02-05 DIAGNOSIS — Z85828 Personal history of other malignant neoplasm of skin: Secondary | ICD-10-CM | POA: Diagnosis not present

## 2019-02-05 DIAGNOSIS — C44311 Basal cell carcinoma of skin of nose: Secondary | ICD-10-CM | POA: Diagnosis not present

## 2019-02-16 DIAGNOSIS — G4733 Obstructive sleep apnea (adult) (pediatric): Secondary | ICD-10-CM | POA: Diagnosis not present

## 2019-02-17 ENCOUNTER — Ambulatory Visit: Payer: PPO | Admitting: Podiatry

## 2019-02-17 ENCOUNTER — Encounter: Payer: Self-pay | Admitting: Podiatry

## 2019-02-17 DIAGNOSIS — B351 Tinea unguium: Secondary | ICD-10-CM

## 2019-02-17 DIAGNOSIS — M79674 Pain in right toe(s): Secondary | ICD-10-CM | POA: Diagnosis not present

## 2019-02-17 DIAGNOSIS — D689 Coagulation defect, unspecified: Secondary | ICD-10-CM

## 2019-02-17 DIAGNOSIS — M79675 Pain in left toe(s): Secondary | ICD-10-CM

## 2019-02-17 NOTE — Progress Notes (Signed)
Complaint:  Visit Type: Patient presents  to my office for  preventative foot care services. Complaint: Patient states" my nails have grown long and thick and become painful to walk and wear shoes" Patient has been diagnosed with neuropathy. The patient presents for preventative foot care services. No changes to ROS.  Patient is taking eliquiss.  Podiatric Exam: Vascular: dorsalis pedis and posterior tibial pulses are not  palpable bilateral. Capillary return is diminished . Temperature gradient is WNL. Skin turgor WNL  Sensorium: Absent  Semmes Weinstein monofilament test. Normal tactile sensation bilaterally. Nail Exam: Pt has thick disfigured discolored nails with subungual debris noted bilateral entire nail hallux B/L Ulcer Exam: There is no evidence of ulcer or pre-ulcerative changes or infection. Orthopedic Exam: Muscle tone and strength are WNL. No limitations in general ROM. No crepitus or effusions noted. Foot type and digits show no abnormalities. Bony prominences are unremarkable. Skin: No Porokeratosis. No infection or ulcers  Diagnosis:  Onychomycosis, , Pain in right toe, pain in left toes  Treatment & Plan Procedures and Treatment: Consent by patient was obtained for treatment procedures.   Debridement of mycotic and hypertrophic toenails, 1 through 5 bilateral and clearing of subungual debris. No ulceration, no infection noted.  Return Visit-Office Procedure: Patient instructed to return to the office for a follow up visit 3 months for continued evaluation and treatment.    Tonette Koehne DPM 

## 2019-02-18 ENCOUNTER — Ambulatory Visit: Payer: PPO | Admitting: Podiatry

## 2019-02-24 DIAGNOSIS — Z961 Presence of intraocular lens: Secondary | ICD-10-CM | POA: Diagnosis not present

## 2019-02-24 DIAGNOSIS — H35352 Cystoid macular degeneration, left eye: Secondary | ICD-10-CM | POA: Diagnosis not present

## 2019-02-24 DIAGNOSIS — H35372 Puckering of macula, left eye: Secondary | ICD-10-CM | POA: Diagnosis not present

## 2019-02-24 DIAGNOSIS — H34832 Tributary (branch) retinal vein occlusion, left eye, with macular edema: Secondary | ICD-10-CM | POA: Diagnosis not present

## 2019-02-24 DIAGNOSIS — H43813 Vitreous degeneration, bilateral: Secondary | ICD-10-CM | POA: Diagnosis not present

## 2019-02-24 DIAGNOSIS — Z79899 Other long term (current) drug therapy: Secondary | ICD-10-CM | POA: Diagnosis not present

## 2019-02-24 DIAGNOSIS — H30031 Focal chorioretinal inflammation, peripheral, right eye: Secondary | ICD-10-CM | POA: Diagnosis not present

## 2019-03-05 DIAGNOSIS — C44321 Squamous cell carcinoma of skin of nose: Secondary | ICD-10-CM | POA: Diagnosis not present

## 2019-03-05 DIAGNOSIS — Z85828 Personal history of other malignant neoplasm of skin: Secondary | ICD-10-CM | POA: Diagnosis not present

## 2019-03-12 DIAGNOSIS — H2511 Age-related nuclear cataract, right eye: Secondary | ICD-10-CM | POA: Diagnosis not present

## 2019-03-12 DIAGNOSIS — M5416 Radiculopathy, lumbar region: Secondary | ICD-10-CM | POA: Diagnosis not present

## 2019-03-12 DIAGNOSIS — D485 Neoplasm of uncertain behavior of skin: Secondary | ICD-10-CM | POA: Diagnosis not present

## 2019-03-12 DIAGNOSIS — F332 Major depressive disorder, recurrent severe without psychotic features: Secondary | ICD-10-CM | POA: Diagnosis not present

## 2019-03-12 DIAGNOSIS — C44311 Basal cell carcinoma of skin of nose: Secondary | ICD-10-CM | POA: Diagnosis not present

## 2019-03-18 DIAGNOSIS — K2270 Barrett's esophagus without dysplasia: Secondary | ICD-10-CM | POA: Diagnosis not present

## 2019-03-18 DIAGNOSIS — C44311 Basal cell carcinoma of skin of nose: Secondary | ICD-10-CM | POA: Diagnosis not present

## 2019-03-18 DIAGNOSIS — K2100 Gastro-esophageal reflux disease with esophagitis, without bleeding: Secondary | ICD-10-CM | POA: Diagnosis not present

## 2019-03-18 DIAGNOSIS — Z85828 Personal history of other malignant neoplasm of skin: Secondary | ICD-10-CM | POA: Diagnosis not present

## 2019-04-07 ENCOUNTER — Other Ambulatory Visit (HOSPITAL_COMMUNITY): Payer: Self-pay

## 2019-04-07 MED ORDER — APIXABAN 5 MG PO TABS: ORAL_TABLET | ORAL | 2 refills | Status: DC

## 2019-04-09 ENCOUNTER — Telehealth (HOSPITAL_COMMUNITY): Payer: Self-pay | Admitting: Vascular Surgery

## 2019-04-09 NOTE — Telephone Encounter (Signed)
Left pt message that 5/20 echo /f/u w/ db will be canceled Due tos Covid. Asked pt to call office if he is having problems he could set up televist w/ np/pa, or reschedule both appts late summer, pt will be put on list

## 2019-04-24 ENCOUNTER — Telehealth: Payer: Self-pay | Admitting: Neurology

## 2019-04-24 ENCOUNTER — Other Ambulatory Visit: Payer: Self-pay

## 2019-04-24 MED ORDER — CYANOCOBALAMIN 1000 MCG/ML IJ KIT: 1000.0000 ug | PACK | INTRAMUSCULAR | 6 refills | Status: DC

## 2019-04-24 NOTE — Telephone Encounter (Signed)
Wife calling in about the B12 medication injectables. She said that the pharm [facility] on [address]. New prescription because of no refills. Please send this. Thanks!

## 2019-04-24 NOTE — Telephone Encounter (Signed)
Rx sent 

## 2019-04-29 ENCOUNTER — Other Ambulatory Visit (HOSPITAL_COMMUNITY): Payer: PPO

## 2019-04-29 ENCOUNTER — Encounter (HOSPITAL_COMMUNITY): Payer: PPO | Admitting: Internal Medicine

## 2019-05-08 DIAGNOSIS — M25511 Pain in right shoulder: Secondary | ICD-10-CM | POA: Diagnosis not present

## 2019-05-08 DIAGNOSIS — M19011 Primary osteoarthritis, right shoulder: Secondary | ICD-10-CM | POA: Diagnosis not present

## 2019-05-14 ENCOUNTER — Telehealth: Payer: Self-pay

## 2019-05-14 ENCOUNTER — Other Ambulatory Visit: Payer: Self-pay | Admitting: Neurology

## 2019-05-14 DIAGNOSIS — M25511 Pain in right shoulder: Secondary | ICD-10-CM | POA: Diagnosis not present

## 2019-05-14 NOTE — Telephone Encounter (Signed)
Received ERx request stating that B12 compliance injection kit was not available at [facility].  Per Sandi we did not try to send this in for pt.  B12 1026mg/mL #6 verbally called in.

## 2019-05-18 DIAGNOSIS — G4733 Obstructive sleep apnea (adult) (pediatric): Secondary | ICD-10-CM | POA: Diagnosis not present

## 2019-05-19 ENCOUNTER — Ambulatory Visit: Payer: PPO | Admitting: Podiatry

## 2019-05-19 DIAGNOSIS — H348322 Tributary (branch) retinal vein occlusion, left eye, stable: Secondary | ICD-10-CM | POA: Diagnosis not present

## 2019-05-19 DIAGNOSIS — Z79899 Other long term (current) drug therapy: Secondary | ICD-10-CM | POA: Diagnosis not present

## 2019-05-19 DIAGNOSIS — H30031 Focal chorioretinal inflammation, peripheral, right eye: Secondary | ICD-10-CM | POA: Diagnosis not present

## 2019-05-19 DIAGNOSIS — H35342 Macular cyst, hole, or pseudohole, left eye: Secondary | ICD-10-CM | POA: Diagnosis not present

## 2019-05-19 DIAGNOSIS — Z961 Presence of intraocular lens: Secondary | ICD-10-CM | POA: Diagnosis not present

## 2019-05-19 DIAGNOSIS — H43813 Vitreous degeneration, bilateral: Secondary | ICD-10-CM | POA: Diagnosis not present

## 2019-05-19 DIAGNOSIS — H35372 Puckering of macula, left eye: Secondary | ICD-10-CM | POA: Diagnosis not present

## 2019-05-26 ENCOUNTER — Other Ambulatory Visit: Payer: Self-pay

## 2019-05-26 ENCOUNTER — Encounter: Payer: Self-pay | Admitting: Podiatry

## 2019-05-26 ENCOUNTER — Ambulatory Visit: Payer: PPO | Admitting: Podiatry

## 2019-05-26 DIAGNOSIS — M79674 Pain in right toe(s): Secondary | ICD-10-CM

## 2019-05-26 DIAGNOSIS — M79675 Pain in left toe(s): Secondary | ICD-10-CM

## 2019-05-26 DIAGNOSIS — D689 Coagulation defect, unspecified: Secondary | ICD-10-CM | POA: Diagnosis not present

## 2019-05-26 DIAGNOSIS — B351 Tinea unguium: Secondary | ICD-10-CM | POA: Diagnosis not present

## 2019-05-26 DIAGNOSIS — E1142 Type 2 diabetes mellitus with diabetic polyneuropathy: Secondary | ICD-10-CM

## 2019-05-26 DIAGNOSIS — E1140 Type 2 diabetes mellitus with diabetic neuropathy, unspecified: Secondary | ICD-10-CM | POA: Insufficient documentation

## 2019-05-26 NOTE — Progress Notes (Signed)
Complaint:  Visit Type: Patient presents  to my office for  preventative foot care services. Complaint: Patient states" my nails have grown long and thick and become painful to walk and wear shoes" Patient has been diagnosed with neuropathy. The patient presents for preventative foot care services. No changes to ROS.  Patient is taking eliquiss.  Podiatric Exam: Vascular: dorsalis pedis and posterior tibial pulses are not  palpable bilateral. Capillary return is diminished . Temperature gradient is WNL. Skin turgor WNL  Sensorium: Absent  Semmes Weinstein monofilament test. Normal tactile sensation bilaterally. Nail Exam: Pt has thick disfigured discolored nails with subungual debris noted bilateral entire nail hallux B/L Ulcer Exam: There is no evidence of ulcer or pre-ulcerative changes or infection. Orthopedic Exam: Muscle tone and strength are WNL. No limitations in general ROM. No crepitus or effusions noted. Foot type and digits show no abnormalities. Bony prominences are unremarkable. Skin: No Porokeratosis. No infection or ulcers  Diagnosis:  Onychomycosis, , Pain in right toe, pain in left toes  Treatment & Plan Procedures and Treatment: Consent by patient was obtained for treatment procedures.   Debridement of mycotic and hypertrophic toenails, 1 through 5 bilateral and clearing of subungual debris. No ulceration, no infection noted.  Return Visit-Office Procedure: Patient instructed to return to the office for a follow up visit 3 months for continued evaluation and treatment.    Gardiner Barefoot DPM

## 2019-06-01 DIAGNOSIS — B349 Viral infection, unspecified: Secondary | ICD-10-CM | POA: Diagnosis not present

## 2019-06-03 DIAGNOSIS — B349 Viral infection, unspecified: Secondary | ICD-10-CM | POA: Diagnosis not present

## 2019-06-17 ENCOUNTER — Telehealth (HOSPITAL_COMMUNITY): Payer: Self-pay

## 2019-06-17 NOTE — Telephone Encounter (Signed)
Pt called requesting samples for eliquis.  [facility] clinic supplied 1 box and set at front desk.  Pt appreciative and will pick up tomorrow.   Medication Samples have been provided to the patient.  Drug name: eliquis       Strength: 5mg         Qty: 14  LOT: [id number]  Exp.Date: 7/22  Dosing instructions: 1 tab twice a day  The patient has been instructed regarding the correct time, dose, and frequency of taking this medication, including desired effects and most common side effects.   Valeda Malm 3:48 PM 06/17/2019

## 2019-06-18 DIAGNOSIS — N3941 Urge incontinence: Secondary | ICD-10-CM | POA: Diagnosis not present

## 2019-06-20 DIAGNOSIS — M545 Low back pain: Secondary | ICD-10-CM | POA: Diagnosis not present

## 2019-07-16 DIAGNOSIS — J302 Other seasonal allergic rhinitis: Secondary | ICD-10-CM | POA: Diagnosis not present

## 2019-07-16 DIAGNOSIS — I482 Chronic atrial fibrillation, unspecified: Secondary | ICD-10-CM | POA: Diagnosis not present

## 2019-07-16 DIAGNOSIS — R7303 Prediabetes: Secondary | ICD-10-CM | POA: Diagnosis not present

## 2019-07-16 DIAGNOSIS — G4730 Sleep apnea, unspecified: Secondary | ICD-10-CM | POA: Diagnosis not present

## 2019-07-16 DIAGNOSIS — Z1389 Encounter for screening for other disorder: Secondary | ICD-10-CM | POA: Diagnosis not present

## 2019-07-16 DIAGNOSIS — Z0000 Encounter for general adult medical examination without abnormal findings: Secondary | ICD-10-CM | POA: Diagnosis not present

## 2019-07-16 DIAGNOSIS — N3281 Overactive bladder: Secondary | ICD-10-CM | POA: Diagnosis not present

## 2019-07-16 DIAGNOSIS — E538 Deficiency of other specified B group vitamins: Secondary | ICD-10-CM | POA: Diagnosis not present

## 2019-07-16 DIAGNOSIS — D6869 Other thrombophilia: Secondary | ICD-10-CM | POA: Diagnosis not present

## 2019-07-16 DIAGNOSIS — H349 Unspecified retinal vascular occlusion: Secondary | ICD-10-CM | POA: Diagnosis not present

## 2019-07-16 DIAGNOSIS — G6289 Other specified polyneuropathies: Secondary | ICD-10-CM | POA: Diagnosis not present

## 2019-07-21 DIAGNOSIS — N3941 Urge incontinence: Secondary | ICD-10-CM | POA: Diagnosis not present

## 2019-07-21 DIAGNOSIS — R350 Frequency of micturition: Secondary | ICD-10-CM | POA: Diagnosis not present

## 2019-08-17 NOTE — Progress Notes (Signed)
CARDIOLOGY CLINIC NOTE  Patient ID: Maurice RODRIGUE, male   DOB: 01-29-1933, 85 y.o.   MRN: [id number]  HPI:  Maurice Wood is a delightful 85 y.o. 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  Had episode of CP in January 2011 and underwent cath which showed normal coronaries with EF45% (? artificially low due to AF). Echo  2/11: 60-65%  Echo 7/16  EF 60% Mod MR.  ECHO 2017 EF 60-65%.   Echo 11/18 EF 60-65% severe MAC. Moderate MR  He returns today for routine follow up with his wife. At last visit he was recovering from a fall and we referred to PT.  Feels ok. Denies CP or undue SOB. By the end of the day gets swelling in his ankles. Resolves with elevating. Not wearing compression stockings. No bleeding   Echo today EF 60% mild-mod MR mild AI Severe LAE Personally reviewed   Review of systems complete and found to be negative unless listed in HPI.   Past Medical History:  Diagnosis Date  . Allergy    rhinitis  . Atrial fibrillation [facility]) Jan 2007   echo 1-07 normal ejection fraction, no significant valvular disease. adenosine cardiolite 1-07  no evidence of ischemia. A 48 hour Holter monitor in 7-07 showed good rate controlw/ chronic atrial fibrillation. Cath 1/11 normal cors. EF 45%. Echo 2-11 60-65%  . Benign prostatic hypertrophy   . Cerebral aneurysm    followed by Dr Estanislado Pandy  . Fatigue   . History of chest pain    a. s/p LHC 05/2014 with normal cors  . Hx of colonic polyps    diverticulosis  . Hyperlipidemia   . IBS (irritable bowel syndrome)   . Incontinence    Per pt 12/14/11  . Kidney stones   . Obesity   . Obstructive sleep apnea    noncompliant with CPAP  . Retinal vein occlusion     Current Outpatient Medications  Medication Sig Dispense Refill  . apixaban (ELIQUIS) 5 MG TABS tablet TAKE 1 TABLET (5 MG TOTAL) BY MOUTH 2  (TWO) TIMES DAILY. 180 tablet 2  . azaTHIOprine (IMURAN) 50 MG tablet Take by mouth.    . chlorpheniramine (CHLOR-TRIMETON) 4 MG tablet Chlor-A-Tab 4 mg tablet  Take 1 tablet every 4 hours by oral route.    . Cyanocobalamin (B-12 COMPLIANCE INJECTION) 1000 MCG/ML KIT Inject 1,000 mcg as directed every 30 (thirty) days. 1 kit 6  . doxycycline (VIBRAMYCIN) 100 MG capsule doxycycline hyclate 100 mg capsule    . folic acid (FOLVITE) 1 MG tablet Take by mouth.    Marland Kitchen ipratropium (ATROVENT) 0.06 % nasal spray Place 1 spray into both nostrils 4 (four) times daily.     Marland Kitchen omeprazole (PRILOSEC) 20 MG capsule      No current facility-administered medications for this encounter.      PHYSICAL EXAM: Vitals:   08/18/19 1444  BP: 120/80  Pulse: 79  SpO2: 96%   Wt Readings from Last 3 Encounters:  12/15/18 88.5 kg (195 lb)  12/11/18 89.4 kg (197 lb)  10/29/18 86.6 kg (191 lb)   Physical exam: General:  Elderly  No resp difficulty HEENT: normal Neck: supple. no JVD. Carotids 2+ bilat; no bruits. No lymphadenopathy or thryomegaly appreciated. Cor: PMI nondisplaced. Irregular rate & rhythm. Soft MR Lungs: clear Abdomen: soft, nontender, nondistended. No hepatosplenomegaly. No bruits or masses.  Good bowel sounds. Extremities: no cyanosis, clubbing, rash, Mild pedal edema Neuro: alert & orientedx3, cranial nerves grossly intact. moves all 4 extremities w/o difficulty. Affect pleasant  ECG: AF 80 with occasional PVCs Personally reviewed   Lab Results  Component Value Date   CHOL 166 02/24/2014   HDL 53.80 02/24/2014   LDLCALC 84 02/24/2014   LDLDIRECT 97.4 11/09/2011   TRIG 143.0 02/24/2014   CHOLHDL 3 02/24/2014    ASSESSMENT & PLAN:  1. A-fib -  Permanent. - Rate controlled. Continue eliquis 5 mg twice a day.  - mild bruising. No significant bleeding  2. Moderate MR - Moderate MR on echo 10/2017. EF 60-65% Severe MAC ?prolapse, but leaflets not seen well.  - Echo today with mild to  moderate MR (Personally reviewed). EF and LV dimensions stable. Continue to follow   3. OSA - Continue CPAP    Glori Bickers MD 6:54 PM

## 2019-08-18 ENCOUNTER — Ambulatory Visit (HOSPITAL_BASED_OUTPATIENT_CLINIC_OR_DEPARTMENT_OTHER)
Admission: RE | Admit: 2019-08-18 | Discharge: 2019-08-18 | Disposition: A | Discharge status: Home / Self Care | Payer: PPO | Source: Ambulatory Visit | Attending: Internal Medicine | Admitting: Internal Medicine

## 2019-08-18 ENCOUNTER — Encounter (HOSPITAL_COMMUNITY): Payer: Self-pay | Admitting: Internal Medicine

## 2019-08-18 ENCOUNTER — Ambulatory Visit (HOSPITAL_COMMUNITY)
Admission: RE | Admit: 2019-08-18 | Discharge: 2019-08-18 | Disposition: A | Discharge status: Home / Self Care | Payer: PPO | Source: Ambulatory Visit | Attending: Internal Medicine | Admitting: Internal Medicine

## 2019-08-18 ENCOUNTER — Other Ambulatory Visit: Payer: Self-pay

## 2019-08-18 VITALS — BP 120/80 | HR 79 | Wt 191.0 lb

## 2019-08-18 DIAGNOSIS — I4821 Permanent atrial fibrillation: Secondary | ICD-10-CM | POA: Diagnosis not present

## 2019-08-18 DIAGNOSIS — N3941 Urge incontinence: Secondary | ICD-10-CM | POA: Diagnosis not present

## 2019-08-18 DIAGNOSIS — Z87442 Personal history of urinary calculi: Secondary | ICD-10-CM | POA: Diagnosis not present

## 2019-08-18 DIAGNOSIS — I10 Essential (primary) hypertension: Secondary | ICD-10-CM | POA: Diagnosis not present

## 2019-08-18 DIAGNOSIS — Z8719 Personal history of other diseases of the digestive system: Secondary | ICD-10-CM | POA: Diagnosis not present

## 2019-08-18 DIAGNOSIS — I340 Nonrheumatic mitral (valve) insufficiency: Secondary | ICD-10-CM | POA: Diagnosis not present

## 2019-08-18 DIAGNOSIS — R350 Frequency of micturition: Secondary | ICD-10-CM | POA: Diagnosis not present

## 2019-08-18 DIAGNOSIS — Z9889 Other specified postprocedural states: Secondary | ICD-10-CM

## 2019-08-18 DIAGNOSIS — Z7901 Long term (current) use of anticoagulants: Secondary | ICD-10-CM | POA: Diagnosis not present

## 2019-08-18 DIAGNOSIS — G4733 Obstructive sleep apnea (adult) (pediatric): Secondary | ICD-10-CM | POA: Diagnosis not present

## 2019-08-18 DIAGNOSIS — R3915 Urgency of urination: Secondary | ICD-10-CM | POA: Diagnosis not present

## 2019-08-18 DIAGNOSIS — Z79899 Other long term (current) drug therapy: Secondary | ICD-10-CM | POA: Diagnosis not present

## 2019-08-18 DIAGNOSIS — K589 Irritable bowel syndrome without diarrhea: Secondary | ICD-10-CM | POA: Insufficient documentation

## 2019-08-18 DIAGNOSIS — G4730 Sleep apnea, unspecified: Secondary | ICD-10-CM

## 2019-08-18 DIAGNOSIS — E785 Hyperlipidemia, unspecified: Secondary | ICD-10-CM | POA: Diagnosis not present

## 2019-08-18 DIAGNOSIS — I482 Chronic atrial fibrillation, unspecified: Secondary | ICD-10-CM | POA: Diagnosis present

## 2019-08-18 NOTE — Progress Notes (Signed)
  Echocardiogram 2D Echocardiogram has been performed.  Maurice Wood [name] 08/18/2019, 2:12 PM

## 2019-08-18 NOTE — Patient Instructions (Signed)
Please get you some compression stockings to wear daily  We will contact you in 1 year to schedule your next appointment.

## 2019-08-18 NOTE — Progress Notes (Signed)
Medication Samples have been provided to the patient.  Drug name: Eliquis       Strength: 5mg         Qty: 3  LOT: [id number]  Exp.Date: 4/22  Dosing instructions: Take 1 tab Twice daily   The patient has been instructed regarding the correct time, dose, and frequency of taking this medication, including desired effects and most common side effects.   Maurice Wood 4:38 PM 08/18/2019

## 2019-08-18 NOTE — Addendum Note (Signed)
Encounter addended by: Scarlette Calico, RN on: 08/18/2019 4:39 PM  Actions taken: Clinical Note Signed

## 2019-09-01 ENCOUNTER — Encounter: Payer: Self-pay | Admitting: Podiatry

## 2019-09-01 ENCOUNTER — Other Ambulatory Visit: Payer: Self-pay

## 2019-09-01 ENCOUNTER — Ambulatory Visit (INDEPENDENT_AMBULATORY_CARE_PROVIDER_SITE_OTHER): Payer: PPO | Admitting: Podiatry

## 2019-09-01 DIAGNOSIS — Z79899 Other long term (current) drug therapy: Secondary | ICD-10-CM | POA: Diagnosis not present

## 2019-09-01 DIAGNOSIS — H35372 Puckering of macula, left eye: Secondary | ICD-10-CM | POA: Diagnosis not present

## 2019-09-01 DIAGNOSIS — H34832 Tributary (branch) retinal vein occlusion, left eye, with macular edema: Secondary | ICD-10-CM | POA: Diagnosis not present

## 2019-09-01 DIAGNOSIS — D689 Coagulation defect, unspecified: Secondary | ICD-10-CM

## 2019-09-01 DIAGNOSIS — E1142 Type 2 diabetes mellitus with diabetic polyneuropathy: Secondary | ICD-10-CM | POA: Diagnosis not present

## 2019-09-01 DIAGNOSIS — H35352 Cystoid macular degeneration, left eye: Secondary | ICD-10-CM | POA: Diagnosis not present

## 2019-09-01 DIAGNOSIS — M79674 Pain in right toe(s): Secondary | ICD-10-CM

## 2019-09-01 DIAGNOSIS — M79675 Pain in left toe(s): Secondary | ICD-10-CM

## 2019-09-01 DIAGNOSIS — Z961 Presence of intraocular lens: Secondary | ICD-10-CM | POA: Diagnosis not present

## 2019-09-01 DIAGNOSIS — B351 Tinea unguium: Secondary | ICD-10-CM

## 2019-09-01 DIAGNOSIS — H30031 Focal chorioretinal inflammation, peripheral, right eye: Secondary | ICD-10-CM | POA: Diagnosis not present

## 2019-09-01 DIAGNOSIS — H348322 Tributary (branch) retinal vein occlusion, left eye, stable: Secondary | ICD-10-CM | POA: Diagnosis not present

## 2019-09-01 DIAGNOSIS — H43813 Vitreous degeneration, bilateral: Secondary | ICD-10-CM | POA: Diagnosis not present

## 2019-09-01 NOTE — Progress Notes (Signed)
Complaint:  Visit Type: Patient presents  to my office for  preventative foot care services. Complaint: Patient states" my nails have grown long and thick and become painful to walk and wear shoes" Patient has been diagnosed with neuropathy. The patient presents for preventative foot care services. No changes to ROS.  Patient is taking eliquiss.  Podiatric Exam: Vascular: dorsalis pedis and posterior tibial pulses are not  palpable bilateral. Capillary return is diminished . Temperature gradient is WNL. Skin turgor WNL  Sensorium: Absent  Semmes Weinstein monofilament test. Normal tactile sensation bilaterally. Nail Exam: Pt has thick disfigured discolored nails with subungual debris noted bilateral entire nail hallux B/L Ulcer Exam: There is no evidence of ulcer or pre-ulcerative changes or infection. Orthopedic Exam: Muscle tone and strength are WNL. No limitations in general ROM. No crepitus or effusions noted. Foot type and digits show no abnormalities. Bony prominences are unremarkable. Skin: No Porokeratosis. No infection or ulcers  Diagnosis:  Onychomycosis, , Pain in right toe, pain in left toes  Treatment & Plan Procedures and Treatment: Consent by patient was obtained for treatment procedures.   Debridement of mycotic and hypertrophic toenails, 1 through 5 bilateral and clearing of subungual debris. No ulceration, no infection noted.  Return Visit-Office Procedure: Patient instructed to return to the office for a follow up visit 3 months for continued evaluation and treatment.    Xavyer Steenson DPM 

## 2019-09-02 DIAGNOSIS — H35342 Macular cyst, hole, or pseudohole, left eye: Secondary | ICD-10-CM | POA: Diagnosis not present

## 2019-09-02 DIAGNOSIS — G4733 Obstructive sleep apnea (adult) (pediatric): Secondary | ICD-10-CM | POA: Diagnosis not present

## 2019-09-02 DIAGNOSIS — H5340 Unspecified visual field defects: Secondary | ICD-10-CM | POA: Diagnosis not present

## 2019-09-02 DIAGNOSIS — H3500 Unspecified background retinopathy: Secondary | ICD-10-CM | POA: Diagnosis not present

## 2019-09-02 DIAGNOSIS — Z961 Presence of intraocular lens: Secondary | ICD-10-CM | POA: Diagnosis not present

## 2019-10-04 DIAGNOSIS — R5381 Other malaise: Secondary | ICD-10-CM | POA: Diagnosis not present

## 2019-10-04 DIAGNOSIS — Z20828 Contact with and (suspected) exposure to other viral communicable diseases: Secondary | ICD-10-CM | POA: Diagnosis not present

## 2019-12-01 ENCOUNTER — Other Ambulatory Visit: Payer: Self-pay

## 2019-12-01 ENCOUNTER — Encounter: Payer: Self-pay | Admitting: Podiatry

## 2019-12-01 ENCOUNTER — Ambulatory Visit: Payer: PPO | Admitting: Podiatry

## 2019-12-01 DIAGNOSIS — M79674 Pain in right toe(s): Secondary | ICD-10-CM

## 2019-12-01 DIAGNOSIS — E1142 Type 2 diabetes mellitus with diabetic polyneuropathy: Secondary | ICD-10-CM | POA: Diagnosis not present

## 2019-12-01 DIAGNOSIS — B351 Tinea unguium: Secondary | ICD-10-CM

## 2019-12-01 DIAGNOSIS — D689 Coagulation defect, unspecified: Secondary | ICD-10-CM | POA: Diagnosis not present

## 2019-12-01 DIAGNOSIS — G629 Polyneuropathy, unspecified: Secondary | ICD-10-CM | POA: Diagnosis not present

## 2019-12-01 DIAGNOSIS — M79675 Pain in left toe(s): Secondary | ICD-10-CM

## 2019-12-01 NOTE — Progress Notes (Signed)
Complaint:  Visit Type: Patient presents  to my office for  preventative foot care services. Complaint: Patient states" my nails have grown long and thick and become painful to walk and wear shoes" Patient has been diagnosed with neuropathy. The patient presents for preventative foot care services. No changes to ROS.  Patient is taking eliquiss.  Podiatric Exam: Vascular: dorsalis pedis and posterior tibial pulses are not  palpable bilateral. Capillary return is diminished . Temperature gradient is WNL. Skin turgor WNL  Sensorium: Absent  Semmes Weinstein monofilament test. Normal tactile sensation bilaterally. Nail Exam: Pt has thick disfigured discolored nails with subungual debris noted bilateral entire nail hallux B/L Ulcer Exam: There is no evidence of ulcer or pre-ulcerative changes or infection. Orthopedic Exam: Muscle tone and strength are WNL. No limitations in general ROM. No crepitus or effusions noted. Foot type and digits show no abnormalities. Bony prominences are unremarkable. Skin: No Porokeratosis. No infection or ulcers  Diagnosis:  Onychomycosis, , Pain in right toe, pain in left toes  Treatment & Plan Procedures and Treatment: Consent by patient was obtained for treatment procedures.   Debridement of mycotic and hypertrophic toenails, 1 through 5 bilateral and clearing of subungual debris. No ulceration, no infection noted.  Return Visit-Office Procedure: Patient instructed to return to the office for a follow up visit 3 months for continued evaluation and treatment.    Gardiner Barefoot DPM

## 2019-12-02 ENCOUNTER — Other Ambulatory Visit: Payer: Self-pay

## 2019-12-02 ENCOUNTER — Ambulatory Visit (INDEPENDENT_AMBULATORY_CARE_PROVIDER_SITE_OTHER): Payer: PPO

## 2019-12-02 ENCOUNTER — Ambulatory Visit: Payer: PPO | Admitting: Pulmonary Disease

## 2019-12-02 ENCOUNTER — Encounter: Payer: Self-pay | Admitting: Pulmonary Disease

## 2019-12-02 VITALS — BP 120/70 | HR 84 | Temp 97.4°F | Ht 70.0 in | Wt 190.6 lb

## 2019-12-02 DIAGNOSIS — G4733 Obstructive sleep apnea (adult) (pediatric): Secondary | ICD-10-CM | POA: Diagnosis not present

## 2019-12-02 DIAGNOSIS — R0609 Other forms of dyspnea: Secondary | ICD-10-CM

## 2019-12-02 DIAGNOSIS — R0600 Dyspnea, unspecified: Secondary | ICD-10-CM

## 2019-12-02 DIAGNOSIS — R0602 Shortness of breath: Secondary | ICD-10-CM | POA: Diagnosis not present

## 2019-12-02 DIAGNOSIS — Z9989 Dependence on other enabling machines and devices: Secondary | ICD-10-CM

## 2019-12-02 LAB — COMPREHENSIVE METABOLIC PANEL
ALT: 9 U/L (ref 0–53)
AST: 12 U/L (ref 0–37)
Albumin: 4 g/dL (ref 3.5–5.2)
Alkaline Phosphatase: 73 U/L (ref 39–117)
BUN: 14 mg/dL (ref 6–23)
CO2: 26 mEq/L (ref 19–32)
Calcium: 9.7 mg/dL (ref 8.4–10.5)
Chloride: 108 mEq/L (ref 96–112)
Creatinine, Ser: 0.97 mg/dL (ref 0.40–1.50)
GFR: 73.69 mL/min (ref >60.00)
Glucose, Bld: 134 mg/dL — ABNORMAL HIGH (ref 70–99)
Potassium: 4.6 mEq/L (ref 3.5–5.1)
Sodium: 139 mEq/L (ref 135–145)
Total Bilirubin: 1 mg/dL (ref 0.2–1.2)
Total Protein: 6.9 g/dL (ref 6.0–8.3)

## 2019-12-02 LAB — CBC WITH DIFFERENTIAL/PLATELET
Basophils Absolute: 0.1 10*3/uL (ref 0.0–0.1)
Basophils Relative: 1 % (ref 0.0–3.0)
Eosinophils Absolute: 0.1 10*3/uL (ref 0.0–0.7)
Eosinophils Relative: 2 % (ref 0.0–5.0)
HCT: 43.4 % (ref 39.0–52.0)
Hemoglobin: 14.2 g/dL (ref 13.0–17.0)
Lymphocytes Relative: 18.1 % (ref 12.0–46.0)
Lymphs Abs: 1.1 10*3/uL (ref 0.7–4.0)
MCHC: 32.7 g/dL (ref 30.0–36.0)
MCV: 89.6 fl (ref 78.0–100.0)
Monocytes Absolute: 0.4 10*3/uL (ref 0.1–1.0)
Monocytes Relative: 6.8 % (ref 3.0–12.0)
Neutro Abs: 4.5 10*3/uL (ref 1.4–7.7)
Neutrophils Relative %: 72.1 % (ref 43.0–77.0)
Platelets: 182 10*3/uL (ref 150.0–400.0)
RBC: 4.84 Mil/uL (ref 4.22–5.81)
RDW: 15.4 % (ref 11.5–15.5)
WBC: 6.2 10*3/uL (ref 4.0–10.5)

## 2019-12-02 LAB — BRAIN NATRIURETIC PEPTIDE: Pro B Natriuretic peptide (BNP): 142 pg/mL — ABNORMAL HIGH (ref 0.0–100.0)

## 2019-12-02 NOTE — Addendum Note (Signed)
Addended by: Suzzanne Cloud E on: 12/02/2019 10:49 AM   Modules accepted: Orders

## 2019-12-02 NOTE — Progress Notes (Signed)
[facility]  Chief Complaint  Patient presents with  . Follow-up    OSA on CPAP, Has increased shortness of breath since last visit.    Constitutional:  BP 120/70 (BP Location: Right Arm, Patient Position: Sitting, Cuff Size: Normal)   Pulse 84   Temp (!) 97.4 F (36.3 C)   Ht 5\' 10"  (1.778 m)   Wt 190 lb 9.6 oz (86.5 kg)   SpO2 98% Comment: on room air  BMI 27.35 kg/m   Past Medical History:  Nephrolithiasis, IBS, HLD, Colon polyps, Cerebral aneurysm, BPH, A fib, Cystoid macular degeneration  Brief Summary:  Maurice Wood is a 85 y.o. male with obstructive sleep apnea.  Since his last visit he lost about 5 lbs and had surgery over his nose.  His CPAP mask isn't fitting as well.  It slides up on his face during the night and then wakes him up.  He has noticed feeling more short of breath with exertion.  He recovers after resting for few minutes.  Not having cough, wheeze, sputum, chest pain, or palpitations.  Has ankle swelling, but no leg pain/cramps with walking.  Physical Exam:   Appearance - well kempt   ENMT - no sinus tenderness, no nasal discharge, no oral exudate  Neck - no masses, trachea midline, no thyromegaly, no elevation in JVP  Respiratory - normal appearance of chest wall, normal respiratory effort w/o accessory muscle use, no dullness on percussion, no wheezing or rales  CV - irregular rate and rhythm, 2/6 murmur, no peripheral edema, radial pulses symmetric  GI - soft, non tender  Lymph - no adenopathy noted in neck and axillary areas  MSK - normal gait  Ext - no cyanosis, clubbing, or joint inflammation noted  Skin - no rashes, lesions, or ulcers  Neuro - normal strength, oriented x 3  Psych - normal mood and affect    Assessment/Plan:   Dyspnea on exertion. - he has prior history of smoking and has history of allergies - will arrange for CXR, CBC with diff, CMET, BNP, spirometry and FeNO   Obstructive sleep apnea. - he is compliant with CPAP and reports benefit from therapy - continue CPAP 12 cm H2O - will arrange for mask refitting  Permanent A fib, mild/mod MR. - followed by Dr. Haroldine Laws with cardiology  Chorioretinal inflammation of Rt eye. - remains on imuran - followed by Dr. Dwana Melena with ophthalmology at [facility]    Patient Instructions  Chest xray and lab tests today Will schedule spirometry and FeNO breathing tests Will have your home care company refit your CPAP mask Follow up in 4 weeks    Chesley Mires, MD [place] Pager: [phone] 12/02/2019, 10:17 AM  Flow Sheet    Sleep tests:  PSG 11/09/17 >> AHI 40.8, SpO2 79% CPAP titration 03/25/18 >> CPAP 12 cm H2O >> AHI 0. CPAP 11/02/19 to 12/01/19 >> used on 29 of 30 nights with average 8 hrs 42 min.  Average AHI 2.5 with CPAP 12 cm H2O.  Cardiac tests:  Echo 08/18/19 >> EF 60 to 65%, mild/mod MR, severe LA dilation, mild AR  Medications:   Allergies as of 12/02/2019      Reactions   Epinephrine Palpitations   REACTION: Increased Heart Rate   Nsaids Other (See Comments)   NOT WHILE TAKING ELIQUIS NOT WHILE TAKING ELIQUIS   Clarithromycin Other (See Comments)   REACTION: Headache headache headache   Iodinated Diagnostic Agents  Other (See Comments)   Pt states "turning bright red"   Nimodipine    REACTION: Flushing   Other    Pt states "turning bright red"      Medication List       Accurate as of December 02, 2019 10:17 AM. If you have any questions, ask your nurse or doctor.        STOP taking these medications   diazepam 10 MG tablet Commonly known as: VALIUM Stopped by: Chesley Mires, MD   Fluzone High-Dose Quadrivalent 0.7 ML Susy Generic drug: Influenza Vac High-Dose Quad Stopped by: Chesley Mires, MD   methocarbamol 750 MG tablet Commonly known as: ROBAXIN Stopped by: Chesley Mires, MD     TAKE these medications   apixaban 5 MG Tabs tablet  Commonly known as: Eliquis TAKE 1 TABLET (5 MG TOTAL) BY MOUTH 2 (TWO) TIMES DAILY.   azaTHIOprine 50 MG tablet Commonly known as: IMURAN Take by mouth.   chlorpheniramine 4 MG tablet Commonly known as: CHLOR-TRIMETON Chlor-A-Tab 4 mg tablet  Take 1 tablet every 4 hours by oral route.   cyanocobalamin 1000 MCG/ML injection Commonly known as: (VITAMIN B-12) Inject 1,000 mcg into the skin every 30 (thirty) days.   fluticasone 50 MCG/ACT nasal spray Commonly known as: FLONASE Place into both nostrils daily.   omeprazole 20 MG capsule Commonly known as: PRILOSEC       Past Surgical History:  He  has a past surgical history that includes Cholecystectomy; inguinal and umbilical herniorrhaphy; Tonsillectomy and adenoidectomy (as a child); Cardiac catheterization (1999); Hernia repair; left heart catheterization with coronary angiogram (N/A, 06/01/2014); Lithotripsy; and Interstim Implant placement (04/2017).  Family History:  His family history includes Alcohol abuse in his father; Aneurysm in his father; Cancer in his father and maternal grandmother; Diabetes in his maternal uncle, paternal uncle, and son; Heart disease in his paternal grandfather; Sudden death in his mother.  Social History:  He  reports that he quit smoking about 48 years ago. His smoking use included cigarettes. He has a 20.00 pack-year smoking history. He has never used smokeless tobacco. He reports current alcohol use of about 1.0 standard drinks of alcohol per week. He reports that he does not use drugs.

## 2019-12-02 NOTE — Patient Instructions (Signed)
Chest xray and lab tests today Will schedule spirometry and FeNO breathing tests Will have your home care company refit your CPAP mask Follow up in 4 weeks

## 2019-12-03 ENCOUNTER — Telehealth: Payer: Self-pay | Admitting: Pulmonary Disease

## 2019-12-03 NOTE — Telephone Encounter (Signed)
DG Chest 2 View  Result Date: 12/02/2019 CLINICAL DATA:  Shortness of breath with exertion EXAM: CHEST - 2 VIEW COMPARISON:  09/03/2017 FINDINGS: Mild cardiomegaly. Right lung clear. Persistent density at the left lung base, presumably scarring. No effusions or acute bony abnormality. IMPRESSION: Chronic left basilar density likely reflects scarring. Mild cardiomegaly. No active cardiopulmonary disease. Electronically Signed   By: Rolm Baptise M.D.   On: 12/02/2019 15:37    CMP Latest Ref Rng & Units 12/02/2019 02/20/2018 10/03/2017  Glucose 70 - 99 mg/dL 134(H) - 163(H)  BUN 6 - 23 mg/dL 14 - 13  Creatinine 0.40 - 1.50 mg/dL 0.97 - 0.96  Sodium 135 - 145 mEq/L 139 - 135  Potassium 3.5 - 5.1 mEq/L 4.6 - 4.2  Chloride 96 - 112 mEq/L 108 - 104  CO2 19 - 32 mEq/L 26 - 20(L)  Calcium 8.4 - 10.5 mg/dL 9.7 - 8.9  Total Protein 6.0 - 8.3 g/dL 6.9 6.6 -  Total Bilirubin 0.2 - 1.2 mg/dL 1.0 - -  Alkaline Phos 39 - 117 U/L 73 - -  AST 0 - 37 U/L 12 - -  ALT 0 - 53 U/L 9 - -    CBC    Component Value Date/Time   WBC 6.2 12/02/2019 1049   RBC 4.84 12/02/2019 1049   HGB 14.2 12/02/2019 1049   HCT 43.4 12/02/2019 1049   PLT 182.0 12/02/2019 1049   MCV 89.6 12/02/2019 1049   MCV 85.5 11/11/2015 1029   MCH 31.0 10/03/2017 1814   MCHC 32.7 12/02/2019 1049   RDW 15.4 12/02/2019 1049   LYMPHSABS 1.1 12/02/2019 1049   MONOABS 0.4 12/02/2019 1049   EOSABS 0.1 12/02/2019 1049   BASOSABS 0.1 12/02/2019 1049    ProBNP    Component Value Date/Time   PROBNP 142.0 (H) 12/02/2019 1049    Please let him know CXR shows chronic scarring at Lt base.  No findings to explain his shortness of breath.  Lab tests were normal.

## 2019-12-03 NOTE — Telephone Encounter (Signed)
Left detailed message per the DPR to advise of the response received by Dr. Halford Chessman and to call the office if there are any additional questions. Nothing further needed at this time.

## 2019-12-08 ENCOUNTER — Encounter: Payer: Self-pay | Admitting: Neurology

## 2019-12-08 DIAGNOSIS — Z961 Presence of intraocular lens: Secondary | ICD-10-CM | POA: Diagnosis not present

## 2019-12-08 DIAGNOSIS — H43813 Vitreous degeneration, bilateral: Secondary | ICD-10-CM | POA: Diagnosis not present

## 2019-12-08 DIAGNOSIS — H35372 Puckering of macula, left eye: Secondary | ICD-10-CM | POA: Diagnosis not present

## 2019-12-08 DIAGNOSIS — H35352 Cystoid macular degeneration, left eye: Secondary | ICD-10-CM | POA: Diagnosis not present

## 2019-12-08 DIAGNOSIS — Z79899 Other long term (current) drug therapy: Secondary | ICD-10-CM | POA: Diagnosis not present

## 2019-12-08 DIAGNOSIS — H30031 Focal chorioretinal inflammation, peripheral, right eye: Secondary | ICD-10-CM | POA: Diagnosis not present

## 2019-12-08 DIAGNOSIS — H34832 Tributary (branch) retinal vein occlusion, left eye, with macular edema: Secondary | ICD-10-CM | POA: Diagnosis not present
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
## 2019-12-14 ENCOUNTER — Other Ambulatory Visit: Payer: Self-pay

## 2019-12-14 ENCOUNTER — Encounter: Payer: Self-pay | Admitting: Neurology

## 2019-12-14 ENCOUNTER — Other Ambulatory Visit (INDEPENDENT_AMBULATORY_CARE_PROVIDER_SITE_OTHER): Payer: PPO

## 2019-12-14 ENCOUNTER — Telehealth (INDEPENDENT_AMBULATORY_CARE_PROVIDER_SITE_OTHER): Payer: PPO | Admitting: Neurology

## 2019-12-14 VITALS — Ht 70.0 in | Wt 190.0 lb

## 2019-12-14 DIAGNOSIS — E538 Deficiency of other specified B group vitamins: Secondary | ICD-10-CM | POA: Diagnosis not present

## 2019-12-14 DIAGNOSIS — G609 Hereditary and idiopathic neuropathy, unspecified: Secondary | ICD-10-CM | POA: Diagnosis not present

## 2019-12-15 DIAGNOSIS — H34832 Tributary (branch) retinal vein occlusion, left eye, with macular edema: Secondary | ICD-10-CM | POA: Diagnosis not present

## 2019-12-15 DIAGNOSIS — Z961 Presence of intraocular lens: Secondary | ICD-10-CM | POA: Diagnosis not present

## 2019-12-15 DIAGNOSIS — H35372 Puckering of macula, left eye: Secondary | ICD-10-CM | POA: Diagnosis not present

## 2019-12-15 DIAGNOSIS — H43813 Vitreous degeneration, bilateral: Secondary | ICD-10-CM | POA: Diagnosis not present

## 2019-12-15 DIAGNOSIS — H35352 Cystoid macular degeneration, left eye: Secondary | ICD-10-CM | POA: Diagnosis not present

## 2019-12-15 DIAGNOSIS — H30031 Focal chorioretinal inflammation, peripheral, right eye: Secondary | ICD-10-CM | POA: Diagnosis not present

## 2019-12-15 DIAGNOSIS — Z79899 Other long term (current) drug therapy: Secondary | ICD-10-CM | POA: Diagnosis not present

## 2019-12-15 LAB — VITAMIN B12: Vitamin B-12: 672 pg/mL (ref 200–1100)

## 2019-12-21 DIAGNOSIS — H6123 Impacted cerumen, bilateral: Secondary | ICD-10-CM | POA: Insufficient documentation

## 2019-12-21 DIAGNOSIS — H903 Sensorineural hearing loss, bilateral: Secondary | ICD-10-CM | POA: Diagnosis not present

## 2019-12-21 DIAGNOSIS — Z87891 Personal history of nicotine dependence: Secondary | ICD-10-CM | POA: Diagnosis not present

## 2019-12-21 DIAGNOSIS — Z974 Presence of external hearing-aid: Secondary | ICD-10-CM | POA: Diagnosis not present

## 2019-12-24 ENCOUNTER — Other Ambulatory Visit: Payer: Self-pay | Admitting: Neurology

## 2019-12-24 DIAGNOSIS — Z85828 Personal history of other malignant neoplasm of skin: Secondary | ICD-10-CM | POA: Diagnosis not present

## 2019-12-24 DIAGNOSIS — D0462 Carcinoma in situ of skin of left upper limb, including shoulder: Secondary | ICD-10-CM | POA: Diagnosis not present

## 2019-12-24 DIAGNOSIS — L814 Other melanin hyperpigmentation: Secondary | ICD-10-CM | POA: Diagnosis not present

## 2019-12-24 DIAGNOSIS — D1801 Hemangioma of skin and subcutaneous tissue: Secondary | ICD-10-CM | POA: Diagnosis not present

## 2019-12-24 DIAGNOSIS — L570 Actinic keratosis: Secondary | ICD-10-CM | POA: Diagnosis not present

## 2019-12-29 ENCOUNTER — Encounter: Payer: Self-pay | Admitting: Pulmonary Disease

## 2019-12-29 ENCOUNTER — Ambulatory Visit: Payer: PPO | Admitting: Pulmonary Disease

## 2019-12-29 ENCOUNTER — Other Ambulatory Visit: Payer: Self-pay

## 2019-12-29 VITALS — BP 128/72 | HR 85 | Temp 97.3°F | Ht 70.0 in | Wt 189.4 lb

## 2019-12-29 DIAGNOSIS — R0600 Dyspnea, unspecified: Secondary | ICD-10-CM | POA: Diagnosis not present

## 2019-12-29 DIAGNOSIS — G4733 Obstructive sleep apnea (adult) (pediatric): Secondary | ICD-10-CM

## 2019-12-29 DIAGNOSIS — Z9989 Dependence on other enabling machines and devices: Secondary | ICD-10-CM

## 2019-12-29 DIAGNOSIS — R0609 Other forms of dyspnea: Secondary | ICD-10-CM

## 2019-12-29 NOTE — Patient Instructions (Signed)
Call if your breathing gets worse after you finish course of prednisone.  Follow up in 6 months

## 2019-12-29 NOTE — Progress Notes (Signed)
[place] [facility]  Chief Complaint  Patient presents with  . Follow-up    OSA on CPAP, DOE (dyspnea or exertion) has improved.    Constitutional:  BP 128/72 (BP Location: Left Arm, Patient Position: Sitting, Cuff Size: Normal)   Pulse 85   Temp (!) 97.3 F (36.3 C)   Ht 5\' 10"  (1.778 m)   Wt 189 lb 6.4 oz (85.9 kg)   SpO2 97% Comment: on room air  BMI 27.18 kg/m   Past Medical History:  Nephrolithiasis, IBS, HLD, Colon polyps, Cerebral aneurysm, BPH, A fib, Cystoid macular degeneration, Idiopathic peripheral neuropathy  Brief Summary:  Maurice Wood is a 86 y.o. male with obstructive sleep apnea.  Had CXR, lab work at time of last visit in December.  Labs normal, and CXR showed Lt basilar scarring.  Was seen by ophthalmology recently and started on prednisone.  Currently on 10 mg, with plan to taper over.  He has noticed his breathing has improved since he was started on prednisone.  Not having cough, fever, chest pain, sputum, or hemoptysis.  Using CPAP nightly.  Has new mask ordered.  Respiratory Exam:   Appearance - well kempt   ENMT - no sinus tenderness, no nasal discharge, no oral exudate  Respiratory - normal appearance of chest wall, normal respiratory effort w/o accessory muscle use, no dullness on percussion, no wheezing or rales  CV - irregular, 2/6 systolic murmurs, no peripheral edema, radial pulses symmetric  Ext - no cyanosis, clubbing, or joint inflammation noted  Assessment/Plan:   Dyspnea on exertion. - improved with addition of prednisone recently - asked him to monitor his symptoms once he is off prednisone - if symptoms recur, then consider trying inhaled steroid and rescheduling spirometry and FeNO  Obstructive sleep apnea. - stable - he is compliant with therapy and reports benefit - continue CPAP 12 cm H2O - he has new mask ordered  Permanent A fib, mild/mod MR. - followed by Dr. Haroldine Laws with  cardiology  Chorioretinal inflammation of Rt eye. - remains on imuran - followed by Dr. Dwana Melena with ophthalmology at [facility]  Idiopathic peripheral neuropathy. - followed by Dr. Tomi Likens with [facility] neurology   Patient Instructions  Call if your breathing gets worse after you finish course of prednisone.  Follow up in 6 months   A total of  22 minutes were spent face to face and non-face to face with the patient and more than half of that time involved counseling or coordination of care.   Chesley Mires, MD [place] [facility] Pager: [phone] 12/29/2019, 9:43 AM  Flow Sheet    Sleep tests:  PSG 11/09/17 >> AHI 40.8, SpO2 79% CPAP titration 03/25/18 >> CPAP 12 cm H2O >> AHI 0. CPAP 11/28/19 to 12/27/19 >> used on 30 of 30 nights with average 8 hrs 31 min.  Average AHI 1.3 with CPAP 12 cm H2O  Cardiac tests:  Echo 08/18/19 >> EF 60 to 65%, mild/mod MR, severe LA dilation, mild AR  Medications:   Allergies as of 12/29/2019      Reactions   Epinephrine Palpitations   REACTION: Increased Heart Rate   Nsaids Other (See Comments)   NOT WHILE TAKING ELIQUIS NOT WHILE TAKING ELIQUIS   Clarithromycin Other (See Comments)   REACTION: Headache headache headache   Iodinated Diagnostic Agents Other (See Comments)   Pt states "turning bright red"   Nimodipine    REACTION: Flushing   Other  Pt states "turning bright red"      Medication List       Accurate as of December 29, 2019  9:43 AM. If you have any questions, ask your nurse or doctor.        apixaban 5 MG Tabs tablet Commonly known as: Eliquis TAKE 1 TABLET (5 MG TOTAL) BY MOUTH 2 (TWO) TIMES DAILY.   azaTHIOprine 50 MG tablet Commonly known as: IMURAN Take by mouth.   chlorpheniramine 4 MG tablet Commonly known as: CHLOR-TRIMETON Chlor-A-Tab 4 mg tablet  Take 1 tablet every 4 hours by oral route.   fluticasone 50 MCG/ACT nasal spray Commonly known as: FLONASE Place into both nostrils  daily.   omeprazole 20 MG capsule Commonly known as: PRILOSEC   predniSONE 10 MG tablet Commonly known as: DELTASONE Take 1 tablet by mouth daily. Taking 2 daily, then will reduce to once daily.   vitamin B-12 1000 MCG tablet Commonly known as: CYANOCOBALAMIN Take 1,000 mcg by mouth daily. What changed: Another medication with the same name was removed. Continue taking this medication, and follow the directions you see here. Changed by: Chesley Mires, MD       Past Surgical History:  He  has a past surgical history that includes Cholecystectomy; inguinal and umbilical herniorrhaphy; Tonsillectomy and adenoidectomy (as a child); Cardiac catheterization (1999); Hernia repair; left heart catheterization with coronary angiogram (N/A, 06/01/2014); Lithotripsy; and Interstim Implant placement (04/2017).  Family History:  His family history includes Alcohol abuse in his father; Aneurysm in his father; Cancer in his father and maternal grandmother; Diabetes in his maternal uncle, paternal uncle, and son; Heart disease in his paternal grandfather; Sudden death in his mother.  Social History:  He  reports that he quit smoking about 48 years ago. His smoking use included cigarettes. He has a 20.00 pack-year smoking history. He has never used smokeless tobacco. He reports current alcohol use of about 1.0 standard drinks of alcohol per week. He reports that he does not use drugs.

## 2019-12-31 ENCOUNTER — Ambulatory Visit: Payer: PPO | Admitting: Pulmonary Disease

## 2019-12-31 DIAGNOSIS — G4733 Obstructive sleep apnea (adult) (pediatric): Secondary | ICD-10-CM | POA: Diagnosis not present

## 2019-12-31 DIAGNOSIS — H903 Sensorineural hearing loss, bilateral: Secondary | ICD-10-CM | POA: Diagnosis not present

## 2020-01-08 DIAGNOSIS — R6889 Other general symptoms and signs: Secondary | ICD-10-CM | POA: Diagnosis not present

## 2020-01-09 DIAGNOSIS — Z1152 Encounter for screening for COVID-19: Secondary | ICD-10-CM | POA: Diagnosis not present

## 2020-01-09 DIAGNOSIS — R6889 Other general symptoms and signs: Secondary | ICD-10-CM | POA: Diagnosis not present

## 2020-01-11 ENCOUNTER — Telehealth: Payer: Self-pay | Admitting: Neurology

## 2020-01-11 DIAGNOSIS — R0600 Dyspnea, unspecified: Secondary | ICD-10-CM | POA: Diagnosis not present

## 2020-01-11 DIAGNOSIS — E538 Deficiency of other specified B group vitamins: Secondary | ICD-10-CM | POA: Diagnosis not present

## 2020-01-11 DIAGNOSIS — R110 Nausea: Secondary | ICD-10-CM | POA: Diagnosis not present

## 2020-01-11 DIAGNOSIS — R519 Headache, unspecified: Secondary | ICD-10-CM | POA: Diagnosis not present

## 2020-01-11 DIAGNOSIS — I482 Chronic atrial fibrillation, unspecified: Secondary | ICD-10-CM | POA: Diagnosis not present

## 2020-01-11 DIAGNOSIS — K219 Gastro-esophageal reflux disease without esophagitis: Secondary | ICD-10-CM | POA: Diagnosis not present

## 2020-01-11 DIAGNOSIS — H3091 Unspecified chorioretinal inflammation, right eye: Secondary | ICD-10-CM | POA: Diagnosis not present

## 2020-01-11 DIAGNOSIS — R5383 Other fatigue: Secondary | ICD-10-CM | POA: Diagnosis not present

## 2020-01-11 NOTE — Telephone Encounter (Signed)
Patient wife states that patient is having really bad headaches for several weeks he went to the PCP today and they did a bunch of blood work. They would like to know if Maurice Wood needs to see them after they get the blood work results. The PCP wants Maurice Wood to see patient.

## 2020-01-11 NOTE — Telephone Encounter (Signed)
Please advise on 002/01/2020

## 2020-01-12 NOTE — Telephone Encounter (Signed)
Patient is sch  

## 2020-01-12 NOTE — Telephone Encounter (Signed)
Please schedule an appt and thank you

## 2020-01-12 NOTE — Telephone Encounter (Signed)
Yes, patient should make an appointment.

## 2020-01-14 ENCOUNTER — Telehealth: Payer: Self-pay | Admitting: Neurology

## 2020-01-14 NOTE — Telephone Encounter (Signed)
Patient's wife called in regarding Maurice Wood and him having a really bad headache. She said Tylenol  Is not  seeming to help him. She said that he has an appointment to see Dr. Tomi Likens on 01/21/19. Please Call. Thank you

## 2020-01-14 NOTE — Telephone Encounter (Signed)
Patient is aware and still declines to go to the ED. Stated that if it gets any worse he would go but at this time he is not going to go

## 2020-01-14 NOTE — Telephone Encounter (Signed)
Patient denied the emergency department and stated that he would rather wait until he can come into the office and see the provider. Went over with the patient that the provider is advising he go to the emergency department and that I will document his refusal into his chart and he stated he understood.

## 2020-01-14 NOTE — Telephone Encounter (Signed)
Patient has taken 1000mg  of tylenol. 0-10 scale this headache is rated a 9. Started about 4am. Very top of his head and on the sides of his head is where the pain is. Has taken the tylenol dose twice. Please advise

## 2020-01-14 NOTE — Telephone Encounter (Signed)
Reviewing his history, he has a known cerebral aneurysm.  For this reason, I reiterate going to the ED for further evaluation.

## 2020-01-14 NOTE — Telephone Encounter (Signed)
I have never seen him for headache. If he is having a 9/10 intractable headache, then he needs to go to the ED to rule out a more serious cause

## 2020-01-18 ENCOUNTER — Other Ambulatory Visit (HOSPITAL_COMMUNITY): Payer: Self-pay | Admitting: Internal Medicine

## 2020-01-18 DIAGNOSIS — Z6827 Body mass index (BMI) 27.0-27.9, adult: Secondary | ICD-10-CM | POA: Insufficient documentation

## 2020-01-18 DIAGNOSIS — I671 Cerebral aneurysm, nonruptured: Secondary | ICD-10-CM | POA: Diagnosis not present

## 2020-01-22 ENCOUNTER — Other Ambulatory Visit: Payer: Self-pay | Admitting: Neurosurgery

## 2020-01-22 ENCOUNTER — Ambulatory Visit: Payer: PPO | Admitting: Neurology

## 2020-01-22 DIAGNOSIS — I671 Cerebral aneurysm, nonruptured: Secondary | ICD-10-CM

## 2020-01-25 ENCOUNTER — Telehealth: Payer: Self-pay

## 2020-01-25 NOTE — Telephone Encounter (Signed)
Phone call to patient to review instructions for 13 hr prep for CT w/ contrast on 01/29/2020  at 1:40PM. Prescription called into [facility]. Spoke with both Pt and his wife, they are aware and verbalized understanding of instructions. Prescription: 2/19 1240AM- 50mg  Prednisone 2/19 640AM- 50mg  Prednisone 2/19 1240PM - 50mg  Prednisone and 50mg  Benadryl

## 2020-01-26 DIAGNOSIS — H35352 Cystoid macular degeneration, left eye: Secondary | ICD-10-CM | POA: Diagnosis not present

## 2020-01-26 DIAGNOSIS — H43813 Vitreous degeneration, bilateral: Secondary | ICD-10-CM | POA: Diagnosis not present

## 2020-01-26 DIAGNOSIS — H35372 Puckering of macula, left eye: Secondary | ICD-10-CM | POA: Diagnosis not present

## 2020-01-26 DIAGNOSIS — Z79899 Other long term (current) drug therapy: Secondary | ICD-10-CM | POA: Diagnosis not present

## 2020-01-26 DIAGNOSIS — H30031 Focal chorioretinal inflammation, peripheral, right eye: Secondary | ICD-10-CM | POA: Diagnosis not present

## 2020-01-26 DIAGNOSIS — Z961 Presence of intraocular lens: Secondary | ICD-10-CM | POA: Diagnosis not present

## 2020-01-26 DIAGNOSIS — Z5181 Encounter for therapeutic drug level monitoring: Secondary | ICD-10-CM | POA: Diagnosis not present

## 2020-01-26 DIAGNOSIS — H34832 Tributary (branch) retinal vein occlusion, left eye, with macular edema: Secondary | ICD-10-CM | POA: Diagnosis not present

## 2020-01-29 ENCOUNTER — Inpatient Hospital Stay: Admission: RE | Admit: 2020-01-29 | Payer: PPO | Source: Ambulatory Visit

## 2020-02-02 ENCOUNTER — Other Ambulatory Visit: Payer: Self-pay

## 2020-02-02 ENCOUNTER — Ambulatory Visit
Admission: RE | Admit: 2020-02-02 | Discharge: 2020-02-02 | Disposition: A | Discharge status: Home / Self Care | Payer: PPO | Source: Ambulatory Visit | Attending: Neurosurgery | Admitting: Neurosurgery

## 2020-02-02 DIAGNOSIS — I671 Cerebral aneurysm, nonruptured: Secondary | ICD-10-CM

## 2020-02-02 MED ORDER — IOPAMIDOL (ISOVUE-370) INJECTION 76%
75.0000 mL | Freq: Once | INTRAVENOUS | Status: AC | PRN
  Administered 2020-02-02: 75 mL via INTRAVENOUS

## 2020-02-04 DIAGNOSIS — Z6828 Body mass index (BMI) 28.0-28.9, adult: Secondary | ICD-10-CM | POA: Insufficient documentation

## 2020-02-04 DIAGNOSIS — R030 Elevated blood-pressure reading, without diagnosis of hypertension: Secondary | ICD-10-CM | POA: Insufficient documentation

## 2020-02-04 DIAGNOSIS — I671 Cerebral aneurysm, nonruptured: Secondary | ICD-10-CM | POA: Diagnosis not present

## 2020-02-09 ENCOUNTER — Other Ambulatory Visit (HOSPITAL_COMMUNITY): Payer: Self-pay

## 2020-02-09 MED ORDER — APIXABAN 5 MG PO TABS: ORAL_TABLET | ORAL | 2 refills | Status: DC

## 2020-02-18 DIAGNOSIS — R310 Gross hematuria: Secondary | ICD-10-CM | POA: Diagnosis not present

## 2020-02-18 DIAGNOSIS — R3915 Urgency of urination: Secondary | ICD-10-CM | POA: Diagnosis not present

## 2020-02-18 DIAGNOSIS — R350 Frequency of micturition: Secondary | ICD-10-CM | POA: Diagnosis not present

## 2020-02-18 DIAGNOSIS — N3941 Urge incontinence: Secondary | ICD-10-CM | POA: Diagnosis not present

## 2020-03-01 ENCOUNTER — Ambulatory Visit: Payer: PPO | Admitting: Podiatry

## 2020-03-04 ENCOUNTER — Ambulatory Visit: Payer: PPO | Admitting: Podiatry

## 2020-03-14 DIAGNOSIS — N200 Calculus of kidney: Secondary | ICD-10-CM | POA: Diagnosis not present

## 2020-03-14 DIAGNOSIS — R310 Gross hematuria: Secondary | ICD-10-CM | POA: Diagnosis not present

## 2020-03-17 DIAGNOSIS — R350 Frequency of micturition: Secondary | ICD-10-CM | POA: Diagnosis not present

## 2020-03-17 DIAGNOSIS — N401 Enlarged prostate with lower urinary tract symptoms: Secondary | ICD-10-CM | POA: Diagnosis not present

## 2020-03-17 DIAGNOSIS — N35919 Unspecified urethral stricture, male, unspecified site: Secondary | ICD-10-CM | POA: Diagnosis not present

## 2020-03-17 DIAGNOSIS — R310 Gross hematuria: Secondary | ICD-10-CM | POA: Diagnosis not present

## 2020-03-17 DIAGNOSIS — Z940 Kidney transplant status: Secondary | ICD-10-CM | POA: Diagnosis not present

## 2020-03-17 DIAGNOSIS — N200 Calculus of kidney: Secondary | ICD-10-CM | POA: Diagnosis not present

## 2020-03-17 DIAGNOSIS — N3941 Urge incontinence: Secondary | ICD-10-CM | POA: Diagnosis not present

## 2020-03-23 DIAGNOSIS — M19019 Primary osteoarthritis, unspecified shoulder: Secondary | ICD-10-CM | POA: Diagnosis not present

## 2020-03-23 DIAGNOSIS — M25511 Pain in right shoulder: Secondary | ICD-10-CM | POA: Diagnosis not present

## 2020-03-23 DIAGNOSIS — M19011 Primary osteoarthritis, right shoulder: Secondary | ICD-10-CM | POA: Diagnosis not present

## 2020-03-23 DIAGNOSIS — M7541 Impingement syndrome of right shoulder: Secondary | ICD-10-CM | POA: Diagnosis not present

## 2020-03-30 DIAGNOSIS — G4733 Obstructive sleep apnea (adult) (pediatric): Secondary | ICD-10-CM | POA: Diagnosis not present

## 2020-04-12 DIAGNOSIS — H30031 Focal chorioretinal inflammation, peripheral, right eye: Secondary | ICD-10-CM | POA: Diagnosis not present

## 2020-04-12 DIAGNOSIS — H34832 Tributary (branch) retinal vein occlusion, left eye, with macular edema: Secondary | ICD-10-CM | POA: Diagnosis not present

## 2020-04-12 DIAGNOSIS — Z79899 Other long term (current) drug therapy: Secondary | ICD-10-CM | POA: Diagnosis not present

## 2020-04-12 DIAGNOSIS — H35372 Puckering of macula, left eye: Secondary | ICD-10-CM | POA: Diagnosis not present

## 2020-04-12 DIAGNOSIS — H35352 Cystoid macular degeneration, left eye: Secondary | ICD-10-CM | POA: Diagnosis not present

## 2020-04-12 DIAGNOSIS — Z961 Presence of intraocular lens: Secondary | ICD-10-CM | POA: Diagnosis not present

## 2020-04-12 DIAGNOSIS — H43813 Vitreous degeneration, bilateral: Secondary | ICD-10-CM | POA: Diagnosis not present

## 2020-04-13 ENCOUNTER — Ambulatory Visit: Payer: PPO | Admitting: Podiatry

## 2020-04-13 ENCOUNTER — Encounter: Payer: Self-pay | Admitting: Podiatry

## 2020-04-13 ENCOUNTER — Other Ambulatory Visit: Payer: Self-pay

## 2020-04-13 VITALS — Temp 97.2°F

## 2020-04-13 DIAGNOSIS — M79674 Pain in right toe(s): Secondary | ICD-10-CM

## 2020-04-13 DIAGNOSIS — B351 Tinea unguium: Secondary | ICD-10-CM

## 2020-04-13 DIAGNOSIS — D689 Coagulation defect, unspecified: Secondary | ICD-10-CM

## 2020-04-13 DIAGNOSIS — M79675 Pain in left toe(s): Secondary | ICD-10-CM

## 2020-04-13 DIAGNOSIS — E1142 Type 2 diabetes mellitus with diabetic polyneuropathy: Secondary | ICD-10-CM

## 2020-04-13 NOTE — Progress Notes (Signed)
This patient returns to my office for at risk foot care.  This patient requires this care by a professional since this patient will be at risk due to having diabetic neuropathy, and coagulation disorder.  Patient is taking eliquiss. This patient is unable to cut nails himself since the patient cannot reach his nails.These nails are painful walking and wearing shoes.  This patient presents for at risk foot care today.  General Appearance  Alert, conversant and in no acute stress.  Vascular  Dorsalis pedis and posterior tibial  pulses are  Not palpable  bilaterally.  Capillary return is within normal limits  bilaterally. Temperature is within normal limits  bilaterally.  Neurologic  Senn-Weinstein monofilament wire test absent   bilaterally. Muscle power within normal limits bilaterally.  Nails Thick disfigured discolored nails with subungual debris  Hallux nails  bilaterally. No evidence of bacterial infection or drainage bilaterally.  Orthopedic  No limitations of motion  feet .  No crepitus or effusions noted.  No bony pathology or digital deformities noted.  Skin  normotropic skin with no porokeratosis noted bilaterally.  No signs of infections or ulcers noted.     Onychomycosis  Pain in right toes  Pain in left toes  Consent was obtained for treatment procedures.   Mechanical debridement of nails 1-5  bilaterally performed with a nail nipper.  Filed with dremel without incident.    Return office visit    3 months                  Told patient to return for periodic foot care and evaluation due to potential at risk complications.   Gardiner Barefoot DPM

## 2020-04-28 DIAGNOSIS — S51812A Laceration without foreign body of left forearm, initial encounter: Secondary | ICD-10-CM | POA: Diagnosis not present

## 2020-04-28 DIAGNOSIS — S51811A Laceration without foreign body of right forearm, initial encounter: Secondary | ICD-10-CM | POA: Diagnosis not present

## 2020-05-05 ENCOUNTER — Other Ambulatory Visit: Payer: Self-pay

## 2020-05-05 ENCOUNTER — Ambulatory Visit
Admission: RE | Admit: 2020-05-05 | Discharge: 2020-05-05 | Disposition: A | Discharge status: Home / Self Care | Payer: PPO | Source: Ambulatory Visit | Attending: Physician Assistant | Admitting: Physician Assistant

## 2020-05-05 ENCOUNTER — Other Ambulatory Visit: Payer: Self-pay | Admitting: Physician Assistant

## 2020-05-05 DIAGNOSIS — M545 Low back pain, unspecified: Secondary | ICD-10-CM

## 2020-05-05 DIAGNOSIS — M47816 Spondylosis without myelopathy or radiculopathy, lumbar region: Secondary | ICD-10-CM | POA: Diagnosis not present

## 2020-05-13 DIAGNOSIS — K529 Noninfective gastroenteritis and colitis, unspecified: Secondary | ICD-10-CM | POA: Diagnosis not present

## 2020-05-13 DIAGNOSIS — K219 Gastro-esophageal reflux disease without esophagitis: Secondary | ICD-10-CM | POA: Diagnosis not present

## 2020-05-13 DIAGNOSIS — K580 Irritable bowel syndrome with diarrhea: Secondary | ICD-10-CM | POA: Diagnosis not present

## 2020-05-13 DIAGNOSIS — R110 Nausea: Secondary | ICD-10-CM | POA: Diagnosis not present

## 2020-05-22 DIAGNOSIS — R109 Unspecified abdominal pain: Secondary | ICD-10-CM | POA: Diagnosis not present

## 2020-05-25 DIAGNOSIS — Z961 Presence of intraocular lens: Secondary | ICD-10-CM | POA: Diagnosis not present

## 2020-05-25 DIAGNOSIS — H35372 Puckering of macula, left eye: Secondary | ICD-10-CM | POA: Diagnosis not present

## 2020-05-25 DIAGNOSIS — H52203 Unspecified astigmatism, bilateral: Secondary | ICD-10-CM | POA: Diagnosis not present

## 2020-05-26 ENCOUNTER — Emergency Department (HOSPITAL_COMMUNITY)
Admission: EM | Admit: 2020-05-26 | Discharge: 2020-05-26 | Disposition: A | Discharge status: Home / Self Care | Payer: PPO | Attending: Emergency Medicine | Admitting: Emergency Medicine

## 2020-05-26 ENCOUNTER — Emergency Department (HOSPITAL_COMMUNITY): Payer: PPO

## 2020-05-26 ENCOUNTER — Encounter (HOSPITAL_COMMUNITY): Payer: Self-pay | Admitting: Emergency Medicine

## 2020-05-26 ENCOUNTER — Other Ambulatory Visit: Payer: Self-pay

## 2020-05-26 DIAGNOSIS — Z87891 Personal history of nicotine dependence: Secondary | ICD-10-CM | POA: Diagnosis not present

## 2020-05-26 DIAGNOSIS — R609 Edema, unspecified: Secondary | ICD-10-CM

## 2020-05-26 DIAGNOSIS — E1140 Type 2 diabetes mellitus with diabetic neuropathy, unspecified: Secondary | ICD-10-CM | POA: Diagnosis not present

## 2020-05-26 DIAGNOSIS — Z20822 Contact with and (suspected) exposure to covid-19: Secondary | ICD-10-CM | POA: Diagnosis not present

## 2020-05-26 DIAGNOSIS — Z7901 Long term (current) use of anticoagulants: Secondary | ICD-10-CM | POA: Diagnosis not present

## 2020-05-26 DIAGNOSIS — R600 Localized edema: Secondary | ICD-10-CM | POA: Diagnosis not present

## 2020-05-26 DIAGNOSIS — Z79899 Other long term (current) drug therapy: Secondary | ICD-10-CM | POA: Insufficient documentation

## 2020-05-26 DIAGNOSIS — Z91041 Radiographic dye allergy status: Secondary | ICD-10-CM | POA: Insufficient documentation

## 2020-05-26 DIAGNOSIS — R0602 Shortness of breath: Secondary | ICD-10-CM | POA: Insufficient documentation

## 2020-05-26 LAB — CBC
HCT: 44 % (ref 39.0–52.0)
Hemoglobin: 14.1 g/dL (ref 13.0–17.0)
MCH: 29.7 pg (ref 26.0–34.0)
MCHC: 32 g/dL (ref 30.0–36.0)
MCV: 92.6 fL (ref 80.0–100.0)
Platelets: 173 10*3/uL (ref 150–400)
RBC: 4.75 MIL/uL (ref 4.22–5.81)
RDW: 14.6 % (ref 11.5–15.5)
WBC: 6.7 10*3/uL (ref 4.0–10.5)
nRBC: 0 % (ref 0.0–0.2)

## 2020-05-26 LAB — TROPONIN I (HIGH SENSITIVITY)
Troponin I (High Sensitivity): 7 ng/L (ref <18)
Troponin I (High Sensitivity): 7 ng/L (ref <18)

## 2020-05-26 LAB — BASIC METABOLIC PANEL
Anion gap: 10 (ref 5–15)
BUN: 11 mg/dL (ref 8–23)
CO2: 23 mmol/L (ref 22–32)
Calcium: 8.8 mg/dL — ABNORMAL LOW (ref 8.9–10.3)
Chloride: 109 mmol/L (ref 98–111)
Creatinine, Ser: 0.89 mg/dL (ref 0.61–1.24)
GFR calc Af Amer: >60 mL/min (ref >60)
GFR calc non Af Amer: >60 mL/min (ref >60)
Glucose, Bld: 141 mg/dL — ABNORMAL HIGH (ref 70–99)
Potassium: 3.8 mmol/L (ref 3.5–5.1)
Sodium: 142 mmol/L (ref 135–145)

## 2020-05-26 LAB — SARS CORONAVIRUS 2 BY RT PCR (HOSPITAL ORDER, PERFORMED IN ~~LOC~~ HOSPITAL LAB): SARS Coronavirus 2: NEGATIVE

## 2020-05-26 LAB — BRAIN NATRIURETIC PEPTIDE: B Natriuretic Peptide: 287.5 pg/mL — ABNORMAL HIGH (ref 0.0–100.0)

## 2020-05-26 LAB — D-DIMER, QUANTITATIVE: D-Dimer, Quant: 0.63 ug/mL-FEU — ABNORMAL HIGH (ref 0.00–0.50)

## 2020-05-26 MED ORDER — FUROSEMIDE 10 MG/ML IJ SOLN
40.0000 mg | Freq: Once | INTRAMUSCULAR | Status: AC
  Administered 2020-05-26: 40 mg via INTRAVENOUS
  Filled 2020-05-26: qty 4

## 2020-05-26 MED ORDER — POTASSIUM CHLORIDE CRYS ER 20 MEQ PO TBCR
20.0000 meq | EXTENDED_RELEASE_TABLET | Freq: Every day | ORAL | 0 refills | Status: DC
Start: 2020-05-26 — End: 2020-05-30

## 2020-05-26 MED ORDER — FUROSEMIDE 40 MG PO TABS: 40.0000 mg | ORAL_TABLET | Freq: Every day | ORAL | 0 refills | Status: DC

## 2020-05-26 MED ORDER — POTASSIUM CHLORIDE CRYS ER 20 MEQ PO TBCR
40.0000 meq | EXTENDED_RELEASE_TABLET | Freq: Once | ORAL | Status: AC
  Administered 2020-05-26: 40 meq via ORAL
  Filled 2020-05-26: qty 2

## 2020-05-26 NOTE — ED Notes (Signed)
Family at bedside. 

## 2020-05-26 NOTE — ED Notes (Signed)
Pt noted to have oxygen saturations at 85% on room air after standing to use urinal. Good waveform noted. Pt also with increased work of breathing immediately after standing.  Pt placed on 1L O2 by [place], O2 increased to 96%, good waveform.  Will continue to monitor.

## 2020-05-26 NOTE — Discharge Instructions (Addendum)
Follow up with your doctor to arrange for the chest CT scan as we discussed.  Take the lasix medications as prescribed.   Follow up with your cardiologist for further evaluation.  Return as needed for worsening symptoms

## 2020-05-26 NOTE — ED Provider Notes (Signed)
[facility] DEPT Provider Note   CSN: [id number] Arrival date & time: 05/26/20  0901     History Chief Complaint  Patient presents with  . Shortness of Breath    Maurice Wood is a 86 y.o. male.  HPI   Pt has been having sx for several days but getting worse each day.  Last night even more so.  Pt has been coughing, non productive.  He has had some body aches.  He has been feeling short of breath with activity.  No cp.  Some leg swelling around the feet and ankles. No rashes.  No documented fevers.   No hx of chf or copd.  Pt has had pna.    Has has been vaccinated for covid (both doses)  Past Medical History:  Diagnosis Date  . Allergy    rhinitis  . Atrial fibrillation [facility]) Jan 2007   echo 1-07 normal ejection fraction, no significant valvular disease. adenosine cardiolite 1-07  no evidence of ischemia. A 48 hour Holter monitor in 7-07 showed good rate controlw/ chronic atrial fibrillation. Cath 1/11 normal cors. EF 45%. Echo 2-11 60-65%  . Benign prostatic hypertrophy   . Cerebral aneurysm    followed by Dr Estanislado Pandy  . Fatigue   . History of chest pain    a. s/p LHC 05/2014 with normal cors  . Hx of colonic polyps    diverticulosis  . Hyperlipidemia   . IBS (irritable bowel syndrome)   . Incontinence    Per pt 12/14/11  . Kidney stones   . Obesity   . Obstructive sleep apnea    noncompliant with CPAP  . Retinal vein occlusion     Patient Active Problem List   Diagnosis Date Noted  . Pain due to onychomycosis of toenails of both feet 05/26/2019  . Coagulation disorder ([place]) 05/26/2019  . Diabetic neuropathy ([place]) 05/26/2019  . Bilateral lower extremity edema 06/02/2015  . UTI (lower urinary tract infection) 05/25/2015  . Sepsis ([place]) 05/25/2015  . Fever 05/25/2015  . Cough 05/25/2015  . Kidney stone 05/25/2015  . Hypokalemia 05/25/2015  . Acute urinary retention   . H/O cardiac catheterizationn 12/2009 with normal coronary  arteries 05/30/2014  . Low back pain 12/17/2013  . Chest pain 09/10/2013  . Urinary incontinence 06/04/2011  . Long term current use of anticoagulant 01/12/2011  . DM type 2 (diabetes mellitus, type 2) ([place]) 07/31/2010  . HEMORRHOIDS-INTERNAL 06/27/2010  . Sleep apnea 06/27/2010  . DIVERTICULOSIS OF COLON 01/03/2010  . GERD 11/22/2009  . IRRITABLE BOWEL SYNDROME 11/22/2009  . ERECTILE DYSFUNCTION 06/06/2009  . BPH (benign prostatic hyperplasia) 06/18/2008  . PSA, INCREASED 12/19/2007  . OBSTRUCTIVE SLEEP APNEA 07/24/2007  . DISSECTION, CAROTID ARTERY 07/24/2007  . TESTOSTERONE DEFICIENCY 07/23/2007  . HLD (hyperlipidemia) 07/23/2007  . COLONIC POLYPS, HX OF 07/23/2007  . Atrial fibrillation, permanent ([place]) 07/14/2007    Past Surgical History:  Procedure Laterality Date  . CARDIAC CATHETERIZATION  1999  . CHOLECYSTECTOMY    . HERNIA REPAIR    . inguinal and umbilical herniorrhaphy    . INTERSTIM IMPLANT PLACEMENT  04/2017  . LEFT HEART CATHETERIZATION WITH CORONARY ANGIOGRAM N/A 06/01/2014   Procedure: LEFT HEART CATHETERIZATION WITH CORONARY ANGIOGRAM;  Surgeon: Peter M Martinique, MD;  Location: [facility] CATH LAB;  Service: Cardiovascular;  Laterality: N/A;  . LITHOTRIPSY    . TONSILLECTOMY AND ADENOIDECTOMY  as a child       Family History  Problem Relation Age of  Onset  . Cancer Father        lung  . Alcohol abuse Father   . Aneurysm Father        Aortic aneurysm  . Cancer Maternal Grandmother        colon  . Sudden death Mother   . Heart disease Paternal Grandfather   . Diabetes Son   . Diabetes Maternal Uncle   . Diabetes Paternal Uncle     Social History   Tobacco Use  . Smoking status: Former Smoker    Packs/day: 1.00    Years: 20.00    Pack years: 20.00    Types: Cigarettes    Quit date: 12/11/1971    Years since quitting: 48.4  . Smokeless tobacco: Never Used  Vaping Use  . Vaping Use: Never used  Substance Use Topics  . Alcohol use: Yes     Alcohol/week: 1.0 standard drink    Types: 1 Cans of beer per week    Comment: Twice a month  . Drug use: No    Home Medications Prior to Admission medications   Medication Sig Start Date End Date Taking? Authorizing Provider  apixaban (ELIQUIS) 5 MG TABS tablet TAKE 1 TABLET (5 MG TOTAL) BY MOUTH 2 (TWO) TIMES DAILY. Patient taking differently: Take 5 mg by mouth 2 (two) times daily.  02/09/20  Yes [name], Shaune Pascal, MD  azaTHIOprine (IMURAN) 50 MG tablet Take 25 mg by mouth daily.  04/12/20  Yes [provider]  Biotin 1000 MCG CHEW Chew 1,000 mcg by mouth daily.   Yes [provider]  cetirizine (ZYRTEC) 10 MG tablet Take 10 mg by mouth daily.   Yes [provider]  chlorpheniramine (CHLOR-TRIMETON) 4 MG tablet Take 4 mg by mouth daily as needed for allergies.    Yes [provider]  famotidine (PEPCID) 20 MG tablet Take 20 mg by mouth daily as needed for heartburn or indigestion.   Yes [provider]  finasteride (PROSCAR) 5 MG tablet Take 5 mg by mouth daily. 04/09/20  Yes [provider]  omeprazole (PRILOSEC) 20 MG capsule Take 20 mg by mouth daily.  10/04/18  Yes [provider]  vitamin B-12 (CYANOCOBALAMIN) 1000 MCG tablet Take 1,000 mcg by mouth daily.   Yes [provider]  furosemide (LASIX) 40 MG tablet Take 1 tablet (40 mg total) by mouth daily for 5 days. 05/26/20 05/31/20  Dorie Rank, MD  potassium chloride SA (KLOR-CON) 20 MEQ tablet Take 1 tablet (20 mEq total) by mouth daily. 05/26/20   Dorie Rank, MD    Allergies    Epinephrine, Nsaids, Clarithromycin, Iodinated diagnostic agents, Nimodipine, and Other  Review of Systems   Review of Systems  All other systems reviewed and are negative.   Physical Exam Updated Vital Signs BP (!) 139/98   Pulse 84   Temp 97.7 F (36.5 C) (Oral)   Resp (!) 23   SpO2 95%   Physical Exam Vitals and nursing note reviewed.  Constitutional:      Appearance: He is  well-developed. He is not toxic-appearing or diaphoretic.  HENT:     Head: Normocephalic and atraumatic.     Right Ear: External ear normal.     Left Ear: External ear normal.  Eyes:     General: No scleral icterus.       Right eye: No discharge.        Left eye: No discharge.     Conjunctiva/sclera: Conjunctivae normal.  Neck:  Trachea: No tracheal deviation.  Cardiovascular:     Rate and Rhythm: Normal rate. Rhythm irregular.  Pulmonary:     Effort: Pulmonary effort is normal. No respiratory distress.     Breath sounds: No stridor. Examination of the right-lower field reveals rales. Examination of the left-lower field reveals rales. Rales present. No wheezing.  Abdominal:     General: Bowel sounds are normal. There is no distension.     Palpations: Abdomen is soft.     Tenderness: There is no abdominal tenderness. There is no guarding or rebound.  Musculoskeletal:        General: No tenderness.     Cervical back: Neck supple.     Right lower leg: Edema present.     Left lower leg: Edema present.  Skin:    General: Skin is warm and dry.     Findings: No rash.  Neurological:     Mental Status: He is alert.     Cranial Nerves: No cranial nerve deficit (no facial droop, extraocular movements intact, no slurred speech).     Sensory: No sensory deficit.     Motor: No abnormal muscle tone or seizure activity.     Coordination: Coordination normal.     ED Results / Procedures / Treatments   Labs (all labs ordered are listed, but only abnormal results are displayed) Labs Reviewed  BASIC METABOLIC PANEL - Abnormal; Notable for the following components:      Result Value   Glucose, Bld 141 (*)    Calcium 8.8 (*)    All other components within normal limits  BRAIN NATRIURETIC PEPTIDE - Abnormal; Notable for the following components:   B Natriuretic Peptide 287.5 (*)    All other components within normal limits  D-DIMER, QUANTITATIVE (NOT AT [facility]) - Abnormal; Notable for the  following components:   D-Dimer, Quant 0.63 (*)    All other components within normal limits  SARS CORONAVIRUS 2 BY RT PCR (HOSPITAL ORDER, [place] [facility])  CBC  TROPONIN I (HIGH SENSITIVITY)  TROPONIN I (HIGH SENSITIVITY)    EKG EKG Interpretation  Date/Time:  Thursday May 26 2020 09:14:52 EDT Ventricular Rate:  105 PR Interval:    QRS Duration: 78 QT Interval:  355 QTC Calculation: 375 R Axis:   34 Text Interpretation: Atrial fibrillation Ventricular bigeminy Nonspecific repol abnormality, diffuse leads 12 Lead; [place]ar No significant change since prior 9/20 Confirmed by Aletta Edouard [phone]) on 05/26/2020 9:25:52 AM   Radiology DG Chest 2 View  Result Date: 05/26/2020 CLINICAL DATA:  Shortness of breath worsening over the past week. EXAM: CHEST - 2 VIEW COMPARISON:  12/02/2019 FINDINGS: Lungs are adequately inflated without focal airspace consolidation. Mild opacification over the posterior lung bases on the lateral film which may be due to small amount of bilateral pleural fluid/atelectasis. Minimal prominence of the central pulmonary vasculature. Masslike density over the right infrahilar region which may be due to prominent vascularity versus adenopathy/mass. Cardiomediastinal silhouette and remainder of the exam is unchanged. IMPRESSION: 1. Suggestion of small amount of posterior bilateral pleural fluid with possible associated basilar atelectasis. 2. Masslike density in the right infrahilar region which may be due to prominent vascularity versus mass/adenopathy. Recommend contrast-enhanced chest CT on an elective basis for further evaluation. Electronically Signed   By: Marin Olp M.D.   On: 05/26/2020 10:05    Procedures Procedures (including critical care time)  Medications Ordered in ED Medications  furosemide (LASIX) injection 40 mg (40 mg Intravenous  Given 05/26/20 1247)  potassium chloride SA (KLOR-CON) CR tablet 40 mEq (40 mEq Oral Given  05/26/20 1246)    ED Course  I have reviewed the triage vital signs and the nursing notes.  Pertinent labs & imaging results that were available during my care of the patient were reviewed by me and considered in my medical decision making (see chart for details).  Clinical Course as of May 26 1338  Thu May 26, 2020  1103 Chest x-ray suggest possible bilateral pleural fluid.  Question   [JK]  1130 BNP is elevated   [JK]  1130 CBC is normal.  First troponin normal.  Considering her shortness of breath we will proceed with CT angiogram rule out PE   [JK]  1222 D-dimer slightly elevated 0.63 but within age-adjusted normal range   [JK]  1243 Case discussed with pt and wife.  Will give dose a lasix.  Have pt follow up with cardiology.  Pt states he sees Dr Haroldine Laws   [JK]  1247 700 cc urine output after lasix   [JK]    Clinical Course User Index [JK] Dorie Rank, MD   MDM Rules/Calculators/A&P                          Patient presented to the ED for shortness of breath.  Patient overall appears comfortable.  Does not have an oxygen requirement.  At one point patient was placed on 1 L nasal cannula oxygen but he is breathing without difficulty.  Chest x-ray does not show signs of pulmonary edema.  D-dimer is negative.  Doubt PE.  I suspect symptoms may be related to a component of mild CHF.  Patient otherwise appears stable.  Feel he is a candidate for close outpatient follow-up.  We will give a dose of diuretic.  Abnormal chest x-ray finding and need for outpatient CT scan also discussed.  Patient has a contrast allergy so he will need to have the contrast allergy protocol prior to getting a CT scan. Final Clinical Impression(s) / ED Diagnoses Final diagnoses:  Peripheral edema    Rx / DC Orders ED Discharge Orders         Ordered    furosemide (LASIX) 40 MG tablet  Daily     Discontinue  Reprint     05/26/20 1250    potassium chloride SA (KLOR-CON) 20 MEQ tablet  Daily      Discontinue  Reprint     05/26/20 1250           Dorie Rank, MD 05/26/20 1339

## 2020-05-29 NOTE — Progress Notes (Signed)
CARDIOLOGY CLINIC NOTE  Patient ID: Maurice Wood, male   DOB: 1933-08-26, 86 y.o.   MRN: [id number]  HPI:  Maurice Wood is an 86 y.o. 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  Had episode of CP in January 2011 and underwent cath which showed normal coronaries with EF45% (? artificially low due to AF). Echo  2/11: 60-65%  Echo 7/16  EF 60% Mod MR.  ECHO 2017 EF 60-65%.   Echo 11/18 EF 60-65% severe MAC. Moderate MR  He was seen in the ER on 05/26/20 for progressive SOB and cough. No CP. hstrop normal. (7,7). BNP 287 (in setting of chronic AF)  ECG AF 105. CXR showed:  1. Small amount of posterior bilateral pleural fluid 2. Mass-like density in the right infrahilar region which may be due to prominent vascularity versus mass/adenopathy.  CT deferred in ER. Given IV lasix with good output and discharged with oral lasix 40 mg daily x 5 days.   Here for ER f/u. Says he feels pretty good. Denies SOB. Mild LE edema. Wears CPAP. No orthopnea or PND.  No recent dietary indiscretion or other changes. Seems to struggle with his memory a bit.   ReDS 44%   Echo 9/20 EF 60% mild-mod MR mild AI Severe LAE Personally reviewed   Review of systems complete and found to be negative unless listed in HPI.   Past Medical History:  Diagnosis Date  . Allergy    rhinitis  . Atrial fibrillation [facility]) Jan 2007   echo 1-07 normal ejection fraction, no significant valvular disease. adenosine cardiolite 1-07  no evidence of ischemia. A 48 hour Holter monitor in 7-07 showed good rate controlw/ chronic atrial fibrillation. Cath 1/11 normal cors. EF 45%. Echo 2-11 60-65%  . Benign prostatic hypertrophy   . Cerebral aneurysm    followed by Dr Maurice Wood  . Fatigue   . History of chest pain    a. s/p LHC 05/2014 with normal cors  . Hx of colonic polyps     diverticulosis  . Hyperlipidemia   . IBS (irritable bowel syndrome)   . Incontinence    Per pt 12/14/11  . Kidney stones   . Obesity   . Obstructive sleep apnea    noncompliant with CPAP  . Retinal vein occlusion     Current Outpatient Medications  Medication Sig Dispense Refill  . apixaban (ELIQUIS) 5 MG TABS tablet Take 5 mg by mouth 2 (two) times daily.    Marland Kitchen azaTHIOprine (IMURAN) 50 MG tablet Take 25 mg by mouth daily.     . Biotin 1000 MCG CHEW Chew 1,000 mcg by mouth daily.    . cetirizine (ZYRTEC) 10 MG tablet Take 10 mg by mouth daily.    . chlorpheniramine (CHLOR-TRIMETON) 4 MG tablet Take 4 mg by mouth daily as needed for allergies.     . famotidine (PEPCID) 20 MG tablet Take 20 mg by mouth daily as needed for heartburn or indigestion.    . finasteride (PROSCAR) 5 MG tablet Take 5 mg by mouth daily.    . furosemide (LASIX) 40 MG tablet Take 1 tablet (40 mg total) by mouth daily for 5 days. 5 tablet 0  . omeprazole (PRILOSEC) 20 MG capsule Take 20 mg by mouth daily.     . potassium chloride SA (KLOR-CON) 20 MEQ tablet Take 1 tablet (20 mEq total)  by mouth daily. 5 tablet 0  . vitamin B-12 (CYANOCOBALAMIN) 1000 MCG tablet Take 1,000 mcg by mouth daily.     No current facility-administered medications for this encounter.     PHYSICAL EXAM: Vitals:   05/30/20 1046  BP: (!) 100/54  Pulse: 87  SpO2: 96%   Wt Readings from Last 3 Encounters:  05/30/20 84 kg (185 lb 3.2 oz)  12/29/19 85.9 kg (189 lb 6.4 oz)  12/08/19 86.2 kg (190 lb)   Physical exam: General: Elderly. No resp difficulty HEENT: normal Neck: supple. JVP 7 Carotids 2+ bilat; no bruits. No lymphadenopathy or thryomegaly appreciated. Cor: PMI nondisplaced. Irregular rate & rhythm. 2/6 MR Lungs: clear Abdomen: soft, nontender, nondistended. No hepatosplenomegaly. No bruits or masses. Good bowel sounds. Extremities: no cyanosis, clubbing, rash, edema + compressio nhose Neuro: alert & orientedx3, cranial  nerves grossly intact. moves all 4 extremities w/o difficulty. Affect pleasant     Lab Results  Component Value Date   CHOL 166 02/24/2014   HDL 53.80 02/24/2014   LDLCALC 84 02/24/2014   LDLDIRECT 97.4 11/09/2011   TRIG 143.0 02/24/2014   CHOLHDL 3 02/24/2014    ASSESSMENT & PLAN:  1. SOB with abnormal CXR - doesn't look markedly fluid overloaded on exam and BNP not that high but ReDS quite high - suspect related to MR - will continue alsix 40 daily with Kdur 20 daily can cut to every other day if gets dizzy - repeat echo - check chest CT (will need steroid prep due to contrast allergy)  2. A-fib -  Permanent. - Rate controlled. Continue eliquis 5 mg twice a day.  - No significant bleeding  3. Moderate MR - Moderate MR on echo 10/2017. EF 60-65% Severe MAC ?prolapse, but leaflets not seen well.  - Echo 9/20 with mild to moderate MR (Personally reviewed). EF and LV dimensions stable. Continue to follow  - Repeat echo with worsening SOB. Briefly discussed possible MitraClip  4. OSA - Complaint with CPAP    Maurice Bickers MD 11:24 AM

## 2020-05-30 ENCOUNTER — Encounter (HOSPITAL_COMMUNITY): Payer: Self-pay | Admitting: Internal Medicine

## 2020-05-30 ENCOUNTER — Other Ambulatory Visit: Payer: Self-pay

## 2020-05-30 ENCOUNTER — Other Ambulatory Visit (HOSPITAL_COMMUNITY): Payer: Self-pay | Admitting: *Deleted

## 2020-05-30 ENCOUNTER — Ambulatory Visit (HOSPITAL_COMMUNITY)
Admission: RE | Admit: 2020-05-30 | Discharge: 2020-05-30 | Disposition: A | Discharge status: Home / Self Care | Payer: PPO | Source: Ambulatory Visit | Attending: Internal Medicine | Admitting: Internal Medicine

## 2020-05-30 VITALS — BP 100/54 | HR 87 | Wt 185.2 lb

## 2020-05-30 DIAGNOSIS — K589 Irritable bowel syndrome without diarrhea: Secondary | ICD-10-CM | POA: Insufficient documentation

## 2020-05-30 DIAGNOSIS — R0609 Other forms of dyspnea: Secondary | ICD-10-CM

## 2020-05-30 DIAGNOSIS — Z87442 Personal history of urinary calculi: Secondary | ICD-10-CM | POA: Diagnosis not present

## 2020-05-30 DIAGNOSIS — Z86010 Personal history of colonic polyps: Secondary | ICD-10-CM | POA: Diagnosis not present

## 2020-05-30 DIAGNOSIS — Z79899 Other long term (current) drug therapy: Secondary | ICD-10-CM | POA: Diagnosis not present

## 2020-05-30 DIAGNOSIS — I340 Nonrheumatic mitral (valve) insufficiency: Secondary | ICD-10-CM | POA: Diagnosis not present

## 2020-05-30 DIAGNOSIS — G4730 Sleep apnea, unspecified: Secondary | ICD-10-CM | POA: Diagnosis not present

## 2020-05-30 DIAGNOSIS — Z7901 Long term (current) use of anticoagulants: Secondary | ICD-10-CM | POA: Diagnosis not present

## 2020-05-30 DIAGNOSIS — N400 Enlarged prostate without lower urinary tract symptoms: Secondary | ICD-10-CM | POA: Insufficient documentation

## 2020-05-30 DIAGNOSIS — R0602 Shortness of breath: Secondary | ICD-10-CM | POA: Diagnosis not present

## 2020-05-30 DIAGNOSIS — I10 Essential (primary) hypertension: Secondary | ICD-10-CM | POA: Diagnosis not present

## 2020-05-30 DIAGNOSIS — I4821 Permanent atrial fibrillation: Secondary | ICD-10-CM | POA: Diagnosis not present

## 2020-05-30 DIAGNOSIS — G4733 Obstructive sleep apnea (adult) (pediatric): Secondary | ICD-10-CM | POA: Insufficient documentation

## 2020-05-30 DIAGNOSIS — E785 Hyperlipidemia, unspecified: Secondary | ICD-10-CM | POA: Diagnosis not present

## 2020-05-30 DIAGNOSIS — Z91041 Radiographic dye allergy status: Secondary | ICD-10-CM | POA: Diagnosis not present

## 2020-05-30 DIAGNOSIS — R0600 Dyspnea, unspecified: Secondary | ICD-10-CM | POA: Diagnosis not present

## 2020-05-30 DIAGNOSIS — R9389 Abnormal findings on diagnostic imaging of other specified body structures: Secondary | ICD-10-CM | POA: Diagnosis not present

## 2020-05-30 DIAGNOSIS — I482 Chronic atrial fibrillation, unspecified: Secondary | ICD-10-CM | POA: Diagnosis present

## 2020-05-30 MED ORDER — POTASSIUM CHLORIDE CRYS ER 20 MEQ PO TBCR: 20.0000 meq | EXTENDED_RELEASE_TABLET | Freq: Every day | ORAL | 3 refills | Status: DC

## 2020-05-30 MED ORDER — PREDNISONE 50 MG PO TABS
ORAL_TABLET | ORAL | 0 refills | Status: DC
Start: 2020-05-30 — End: 2020-06-27

## 2020-05-30 MED ORDER — FUROSEMIDE 40 MG PO TABS: 40.0000 mg | ORAL_TABLET | Freq: Every day | ORAL | 3 refills | Status: DC

## 2020-05-30 NOTE — Patient Instructions (Addendum)
Start Furosemide 40 mg Daily  Start Potassium 20 meq Daily  Labs needed in 10 days  Your physician has requested that you have an echocardiogram. Echocardiography is a painless test that uses sound waves to create images of your heart. It provides your doctor with information about the size and shape of your heart and how well your heart's chambers and valves are working. This procedure takes approximately one hour. There are no restrictions for this procedure.  Non-Cardiac CT scanning, (CAT scanning), is a noninvasive, special x-ray that produces cross-sectional images of the body using x-rays and a computer. CT scans help physicians diagnose and treat medical conditions. For some CT exams, a contrast material is used to enhance visibility in the area of the body being studied. CT scans provide greater clarity and reveal more details than regular x-ray exams.   CT SCAN INSTRUCTIONS:  Pre-Meds:   Prednisone 50 mg 06/09/20 at 2:00 AM   Prednisone 50 mg 06/09/20 at 8:00 AM   Prednisone 50 mg 06/09/20 at 2:00 PM   Benadryl 50 mg 06/09/20 at 2:00 PM  DIET:   NO Food after 11:00 AM on 06/09/20, can have liquids  Arrival:   Please arrive to the radiology department of [facility] at 2:45 PM on 06/09/20    Your physician recommends that you schedule a follow-up appointment in: 6 weeks  If you have any questions or concerns before your next appointment please send [place] a message through [place] or call our office at [phone].    TO LEAVE A MESSAGE FOR THE NURSE SELECT OPTION 2, PLEASE LEAVE A MESSAGE INCLUDING: . YOUR NAME . DATE OF BIRTH . CALL BACK NUMBER . REASON FOR CALL**this is important as we prioritize the call backs  [place] AS LONG AS YOU CALL BEFORE 4:00 PM  At the [facility], you and your health needs are our priority. As part of our continuing mission to provide you with exceptional heart care, we have created designated Provider  Care Teams. These Care Teams include your primary Cardiologist (physician) and Advanced Practice Providers (APPs- Physician Assistants and Nurse Practitioners) who all work together to provide you with the care you need, when you need it.   You may see any of the following providers on your designated Care Team at your next follow up: Marland Kitchen Dr Glori Bickers . Dr Loralie Champagne . Darrick Grinder, NP . Lyda Jester, PA . Audry Riles, PharmD   Please be sure to bring in all your medications bottles to every appointment.

## 2020-05-30 NOTE — Progress Notes (Signed)
ReDS Vest / Clip - 05/30/20 1100      ReDS Vest / Clip   Station Marker D    Ruler Value 29    ReDS Value Range High volume overload    ReDS Actual Value 44    Anatomical Comments sitting

## 2020-06-09 ENCOUNTER — Ambulatory Visit (HOSPITAL_BASED_OUTPATIENT_CLINIC_OR_DEPARTMENT_OTHER)
Admission: RE | Admit: 2020-06-09 | Discharge: 2020-06-09 | Disposition: A | Discharge status: Home / Self Care | Payer: PPO | Source: Ambulatory Visit | Attending: Internal Medicine | Admitting: Internal Medicine

## 2020-06-09 ENCOUNTER — Ambulatory Visit (HOSPITAL_COMMUNITY)
Admission: RE | Admit: 2020-06-09 | Discharge: 2020-06-09 | Disposition: A | Discharge status: Home / Self Care | Payer: PPO | Source: Ambulatory Visit | Attending: Internal Medicine | Admitting: Internal Medicine

## 2020-06-09 ENCOUNTER — Other Ambulatory Visit: Payer: Self-pay

## 2020-06-09 DIAGNOSIS — R0609 Other forms of dyspnea: Secondary | ICD-10-CM

## 2020-06-09 DIAGNOSIS — I358 Other nonrheumatic aortic valve disorders: Secondary | ICD-10-CM | POA: Diagnosis not present

## 2020-06-09 DIAGNOSIS — R0600 Dyspnea, unspecified: Secondary | ICD-10-CM

## 2020-06-09 DIAGNOSIS — R9389 Abnormal findings on diagnostic imaging of other specified body structures: Secondary | ICD-10-CM | POA: Diagnosis not present

## 2020-06-09 DIAGNOSIS — I700 Atherosclerosis of aorta: Secondary | ICD-10-CM | POA: Diagnosis not present

## 2020-06-09 DIAGNOSIS — J432 Centrilobular emphysema: Secondary | ICD-10-CM | POA: Diagnosis not present

## 2020-06-09 DIAGNOSIS — I2510 Atherosclerotic heart disease of native coronary artery without angina pectoris: Secondary | ICD-10-CM | POA: Diagnosis not present

## 2020-06-09 LAB — BASIC METABOLIC PANEL
Anion gap: 11 (ref 5–15)
BUN: 18 mg/dL (ref 8–23)
CO2: 20 mmol/L — ABNORMAL LOW (ref 22–32)
Calcium: 9.4 mg/dL (ref 8.9–10.3)
Chloride: 108 mmol/L (ref 98–111)
Creatinine, Ser: 1.26 mg/dL — ABNORMAL HIGH (ref 0.61–1.24)
GFR calc Af Amer: >60 mL/min (ref >60)
GFR calc non Af Amer: 52 mL/min — ABNORMAL LOW (ref >60)
Glucose, Bld: 171 mg/dL — ABNORMAL HIGH (ref 70–99)
Potassium: 4.9 mmol/L (ref 3.5–5.1)
Sodium: 139 mmol/L (ref 135–145)

## 2020-06-09 LAB — BRAIN NATRIURETIC PEPTIDE: B Natriuretic Peptide: 149.7 pg/mL — ABNORMAL HIGH (ref 0.0–100.0)

## 2020-06-09 MED ORDER — IOHEXOL 300 MG/ML  SOLN
100.0000 mL | Freq: Once | INTRAMUSCULAR | Status: AC | PRN
  Administered 2020-06-09: 100 mL via INTRAVENOUS

## 2020-06-14 IMAGING — CT CT MAXILLOFACIAL W/O CM
5 of 11 series · 16 of 47 positions shown, 18 images · non-contrast
Comparison: Head, maxillofacial, and cervical spine CTs 12/19/2017

CLINICAL DATA: Fall today with abrasions and lacerations on the
face. On Eliquis for atrial fibrillation. Initial encounter.

EXAM:
CT HEAD WITHOUT CONTRAST
CT MAXILLOFACIAL WITHOUT CONTRAST
CT CERVICAL SPINE WITHOUT CONTRAST
TECHNIQUE: Multidetector CT imaging of the head, cervical spine, and
maxillofacial structures were performed using the standard protocol
without intravenous contrast. Multiplanar CT image reconstructions
of the cervical spine and maxillofacial structures were also
generated.

[Series 5: head bone · axial · 0.47mm/px · z∈[-102,-68]mm · 2 of 86 slices shown]
[im 18/86  bone]
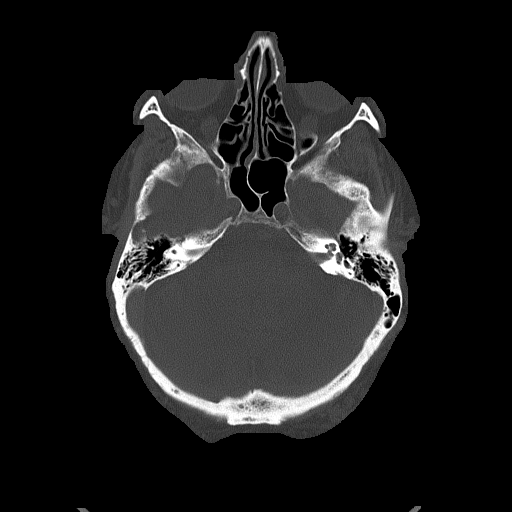
[im 35/86  bone]
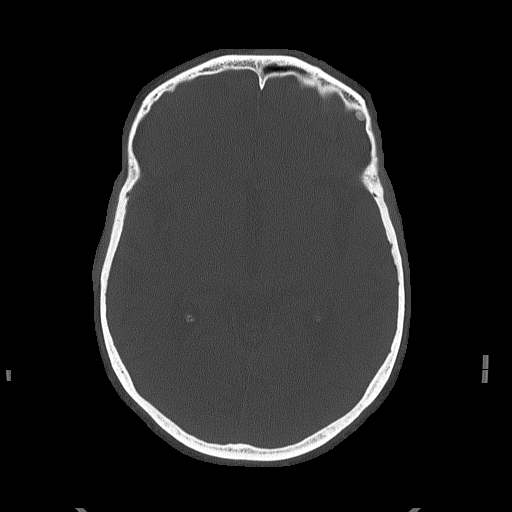

[Series 6: head without cor · coronal · non-contrast · 0.34mm/px · 2 of 73 slices shown]
[im 25/73  bone]
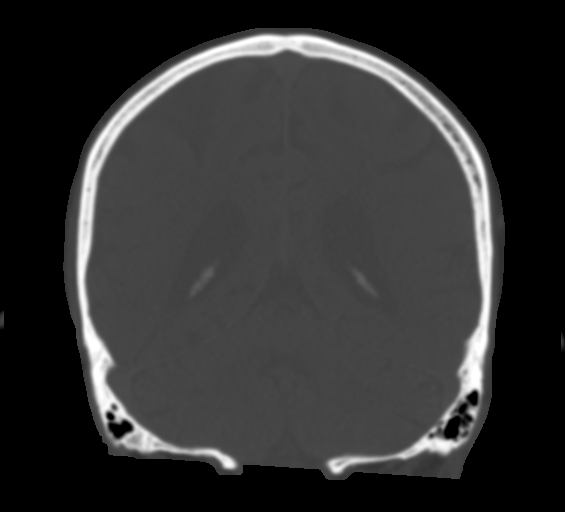
[im 49/73  bone]
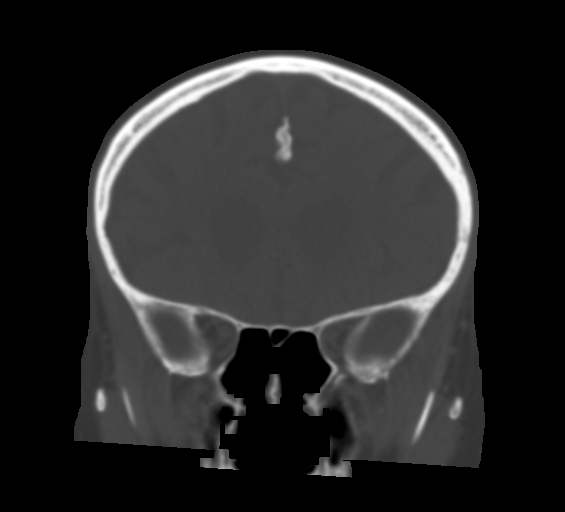

[Series 15: facialbone 2.0 sag st · sagittal · 0.33mm/px · 1 of 118 slices shown]
[im 59/118  bone]
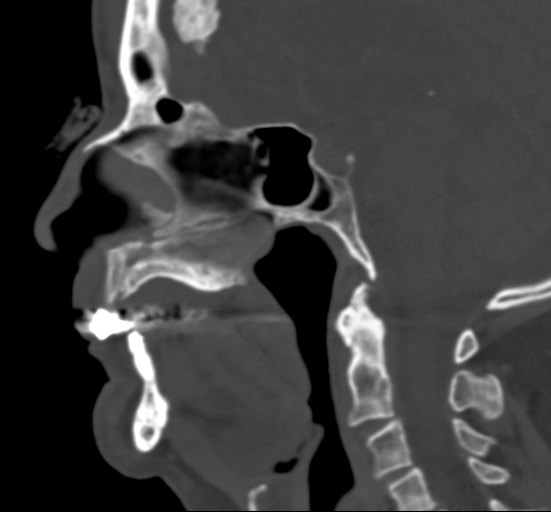

[Series 16: c_spine 2.0 st · axial · 0.32mm/px · z∈[-288,-144]mm · 6 of 102 slices shown, 8 images]
[im 15/102  brain]
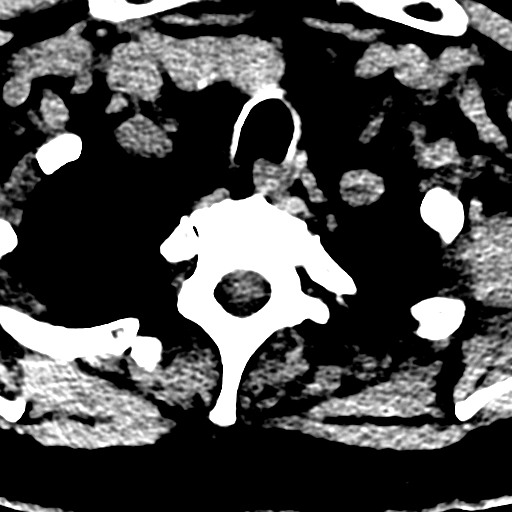
[im 15/102  bone]
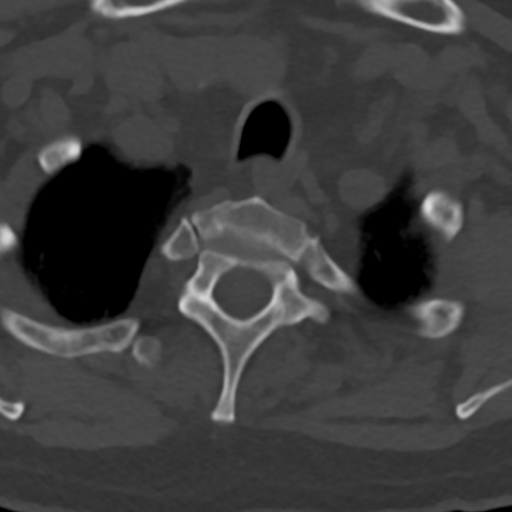
[im 29/102  bone]
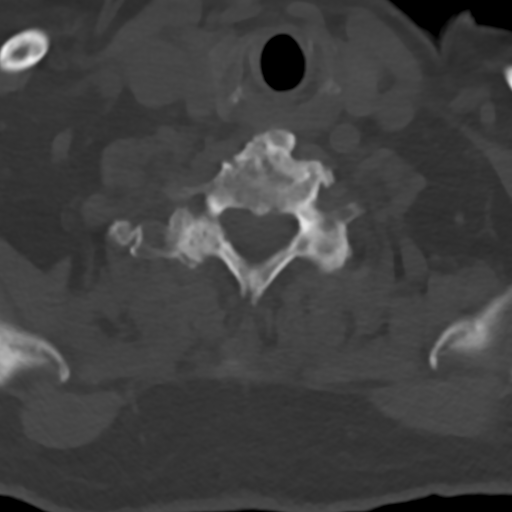
[im 44/102  bone]
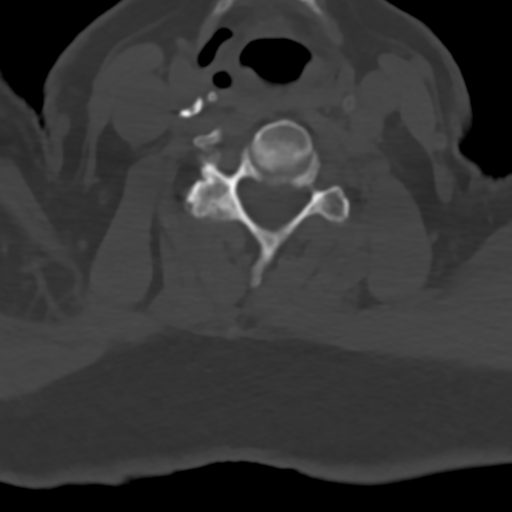
[im 58/102  bone]
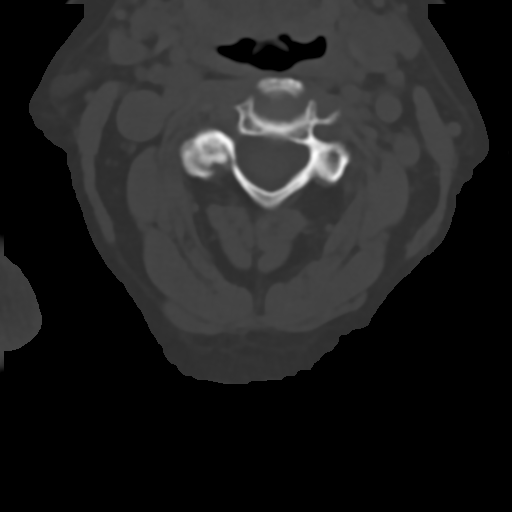
[im 73/102  brain]
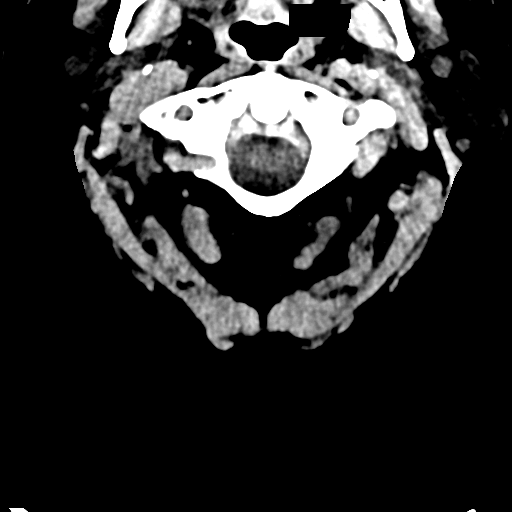
[im 73/102  bone]
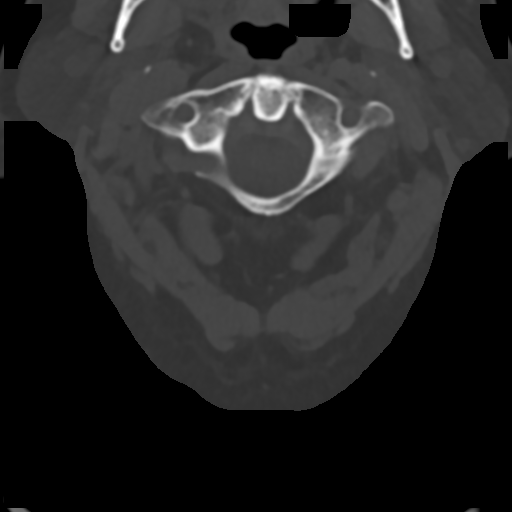
[im 87/102  bone]
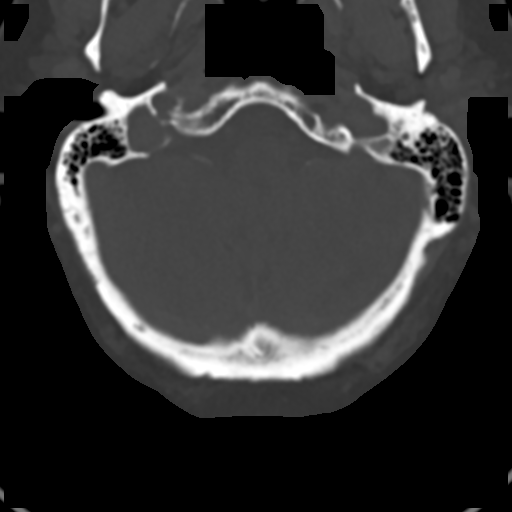

[Series 23: c_spine 2.0 orthogonals · axial · 0.21mm/px · z∈[-299,-199]mm · 5 of 95 slices shown]
[im 16/95  bone]
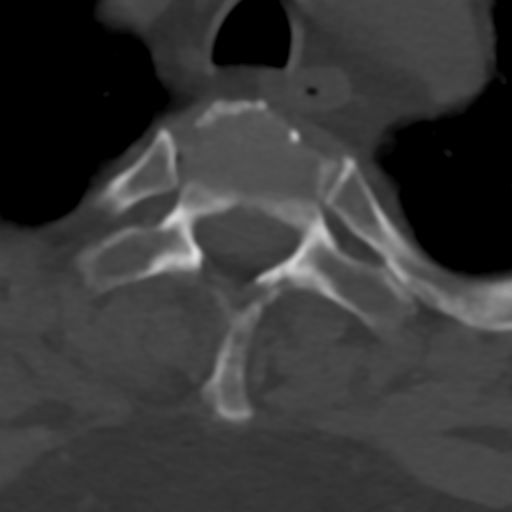
[im 32/95  bone]
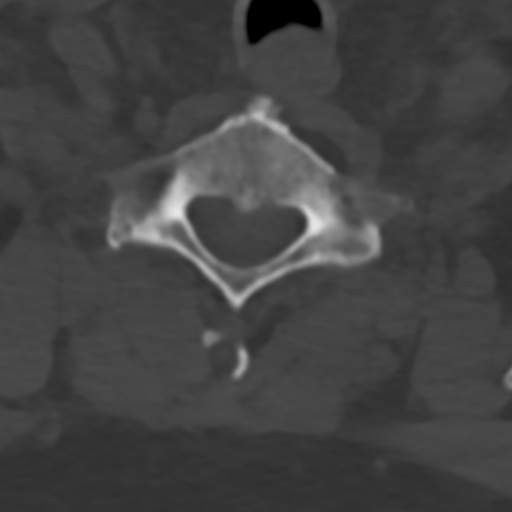
[im 48/95  bone]
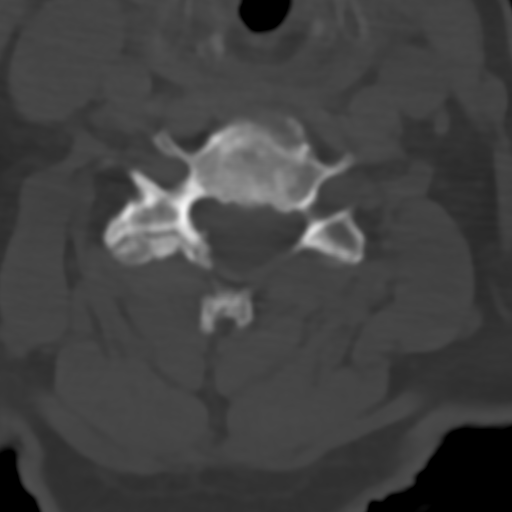
[im 63/95  bone]
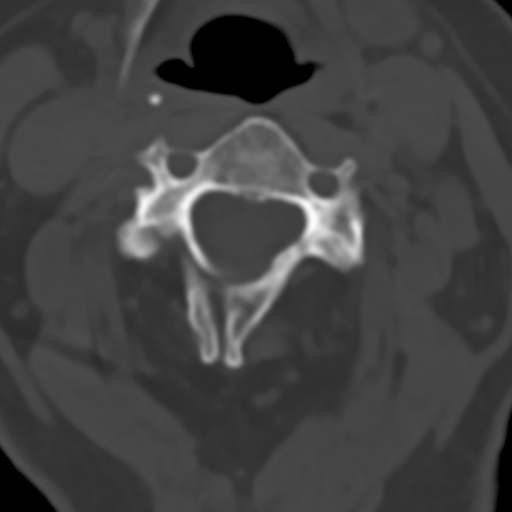
[im 79/95  bone]
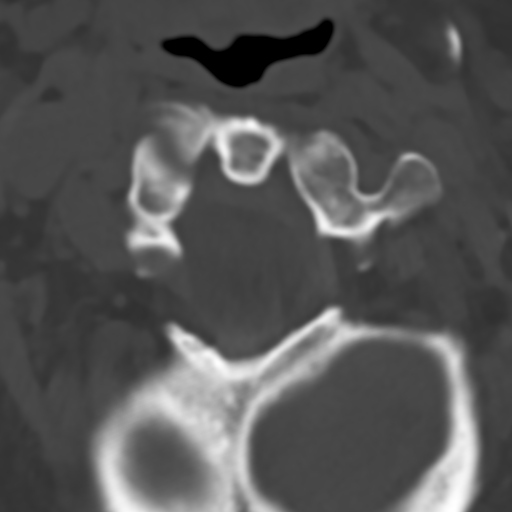

[16 of 47 positions shown; findings below may reference images not displayed]

FINDINGS: CT HEAD FINDINGS

Brain: There is no evidence of acute infarct, intracranial
hemorrhage, mass, midline shift, or extra-axial fluid collection.
Chronic infarcts in the left cerebellum are unchanged. Patchy
cerebral white matter hypodensities are unchanged and nonspecific
but compatible with mild-to-moderate chronic small vessel ischemic
disease. There is mild-to-moderate cerebral atrophy.

Vascular: Calcified atherosclerosis at the skull base. No hyperdense
vessel.

Skull: No fracture or focal osseous lesion.

Other: Moderate-sized frontal scalp hematoma centered just left of
midline.

CT MAXILLOFACIAL FINDINGS

Osseous: No acute fracture or destructive osseous process. Remote
anterior nasal spine fracture. Moderate bilateral TMJ arthrosis
without dislocation.

Orbits: No acute traumatic finding. Bilateral cataract extraction.

Sinuses: Paranasal sinuses and mastoid air cells are clear.

Soft tissues: No acute finding decides above described frontal scalp
hematoma.

CT CERVICAL SPINE FINDINGS

Alignment: Chronic mild reversal of the normal cervical lordosis and
minimal anterolisthesis of, C3 on C4, C4 on C5, and C7 on T1. Mild
left convex curvature of the lower cervical spine.

Skull base and vertebrae: No acute fracture or suspicious osseous
lesion. Atlantodental arthropathy with prominent spurring.

Soft tissues and spinal canal: No prevertebral fluid or swelling. No
visible canal hematoma.

Disc levels: Advanced disc degeneration at C5-6 and C6-7 with disc
space narrowing and bulky osteophyte formation resulting in severe
left-sided neural foraminal stenosis at C6-7 greater than C5-6.
Severe multilevel facet arthrosis.

Upper chest: Clear lung apices.

Other: Calcific atherosclerosis at the left greater than right
carotid bifurcations.
IMPRESSION: 1. No evidence of acute intracranial abnormality.
2. Frontal scalp hematoma.
3. Mild-to-moderate chronic small vessel ischemic disease, cerebral
atrophy, and chronic left cerebellar infarcts.
4. No acute maxillofacial or cervical spine fracture.

## 2020-06-16 DIAGNOSIS — N401 Enlarged prostate with lower urinary tract symptoms: Secondary | ICD-10-CM | POA: Diagnosis not present

## 2020-06-16 DIAGNOSIS — N138 Other obstructive and reflux uropathy: Secondary | ICD-10-CM | POA: Diagnosis not present

## 2020-06-16 DIAGNOSIS — N200 Calculus of kidney: Secondary | ICD-10-CM | POA: Diagnosis not present

## 2020-06-27 ENCOUNTER — Other Ambulatory Visit (HOSPITAL_COMMUNITY): Payer: Self-pay | Admitting: *Deleted

## 2020-06-27 ENCOUNTER — Encounter: Payer: Self-pay | Admitting: Pulmonary Disease

## 2020-06-27 ENCOUNTER — Ambulatory Visit: Payer: PPO | Admitting: Pulmonary Disease

## 2020-06-27 ENCOUNTER — Encounter (HOSPITAL_COMMUNITY): Payer: Self-pay | Admitting: Internal Medicine

## 2020-06-27 ENCOUNTER — Other Ambulatory Visit: Payer: Self-pay

## 2020-06-27 ENCOUNTER — Ambulatory Visit (HOSPITAL_COMMUNITY)
Admission: RE | Admit: 2020-06-27 | Discharge: 2020-06-27 | Disposition: A | Discharge status: Home / Self Care | Payer: PPO | Source: Ambulatory Visit | Attending: Internal Medicine | Admitting: Internal Medicine

## 2020-06-27 VITALS — BP 102/60 | HR 74 | Temp 98.3°F | Ht 70.0 in | Wt 187.2 lb

## 2020-06-27 VITALS — BP 104/62 | HR 62 | Wt 187.0 lb

## 2020-06-27 DIAGNOSIS — Z6826 Body mass index (BMI) 26.0-26.9, adult: Secondary | ICD-10-CM | POA: Diagnosis not present

## 2020-06-27 DIAGNOSIS — Z8719 Personal history of other diseases of the digestive system: Secondary | ICD-10-CM | POA: Diagnosis not present

## 2020-06-27 DIAGNOSIS — I5032 Chronic diastolic (congestive) heart failure: Secondary | ICD-10-CM

## 2020-06-27 DIAGNOSIS — F0390 Unspecified dementia without behavioral disturbance: Secondary | ICD-10-CM | POA: Insufficient documentation

## 2020-06-27 DIAGNOSIS — I482 Chronic atrial fibrillation, unspecified: Secondary | ICD-10-CM | POA: Insufficient documentation

## 2020-06-27 DIAGNOSIS — I4821 Permanent atrial fibrillation: Secondary | ICD-10-CM | POA: Diagnosis not present

## 2020-06-27 DIAGNOSIS — E785 Hyperlipidemia, unspecified: Secondary | ICD-10-CM | POA: Insufficient documentation

## 2020-06-27 DIAGNOSIS — N400 Enlarged prostate without lower urinary tract symptoms: Secondary | ICD-10-CM | POA: Insufficient documentation

## 2020-06-27 DIAGNOSIS — Z9119 Patient's noncompliance with other medical treatment and regimen: Secondary | ICD-10-CM | POA: Insufficient documentation

## 2020-06-27 DIAGNOSIS — R911 Solitary pulmonary nodule: Secondary | ICD-10-CM | POA: Diagnosis not present

## 2020-06-27 DIAGNOSIS — R0609 Other forms of dyspnea: Secondary | ICD-10-CM

## 2020-06-27 DIAGNOSIS — Z7901 Long term (current) use of anticoagulants: Secondary | ICD-10-CM | POA: Diagnosis not present

## 2020-06-27 DIAGNOSIS — I671 Cerebral aneurysm, nonruptured: Secondary | ICD-10-CM | POA: Insufficient documentation

## 2020-06-27 DIAGNOSIS — R918 Other nonspecific abnormal finding of lung field: Secondary | ICD-10-CM | POA: Insufficient documentation

## 2020-06-27 DIAGNOSIS — R0600 Dyspnea, unspecified: Secondary | ICD-10-CM | POA: Diagnosis not present

## 2020-06-27 DIAGNOSIS — R9389 Abnormal findings on diagnostic imaging of other specified body structures: Secondary | ICD-10-CM

## 2020-06-27 DIAGNOSIS — Z9989 Dependence on other enabling machines and devices: Secondary | ICD-10-CM | POA: Diagnosis not present

## 2020-06-27 DIAGNOSIS — J432 Centrilobular emphysema: Secondary | ICD-10-CM | POA: Diagnosis not present

## 2020-06-27 DIAGNOSIS — G4730 Sleep apnea, unspecified: Secondary | ICD-10-CM | POA: Diagnosis not present

## 2020-06-27 DIAGNOSIS — J849 Interstitial pulmonary disease, unspecified: Secondary | ICD-10-CM

## 2020-06-27 DIAGNOSIS — I340 Nonrheumatic mitral (valve) insufficiency: Secondary | ICD-10-CM

## 2020-06-27 DIAGNOSIS — I2510 Atherosclerotic heart disease of native coronary artery without angina pectoris: Secondary | ICD-10-CM | POA: Insufficient documentation

## 2020-06-27 DIAGNOSIS — E669 Obesity, unspecified: Secondary | ICD-10-CM | POA: Diagnosis not present

## 2020-06-27 DIAGNOSIS — Z79899 Other long term (current) drug therapy: Secondary | ICD-10-CM | POA: Insufficient documentation

## 2020-06-27 DIAGNOSIS — Z87442 Personal history of urinary calculi: Secondary | ICD-10-CM | POA: Insufficient documentation

## 2020-06-27 DIAGNOSIS — G4733 Obstructive sleep apnea (adult) (pediatric): Secondary | ICD-10-CM | POA: Diagnosis not present

## 2020-06-27 DIAGNOSIS — I700 Atherosclerosis of aorta: Secondary | ICD-10-CM | POA: Insufficient documentation

## 2020-06-27 DIAGNOSIS — I110 Hypertensive heart disease with heart failure: Secondary | ICD-10-CM | POA: Diagnosis not present

## 2020-06-27 DIAGNOSIS — K589 Irritable bowel syndrome without diarrhea: Secondary | ICD-10-CM | POA: Insufficient documentation

## 2020-06-27 LAB — SEDIMENTATION RATE: Sed Rate: 4 mm/hr (ref 0–20)

## 2020-06-27 MED ORDER — PREDNISONE 50 MG PO TABS
ORAL_TABLET | ORAL | 0 refills | Status: DC
Start: 2020-06-27 — End: 2020-08-08

## 2020-06-27 MED ORDER — SODIUM CHLORIDE 0.9% FLUSH: 3.0000 mL | Freq: Two times a day (BID) | INTRAVENOUS | Status: DC

## 2020-06-27 NOTE — Progress Notes (Signed)
[facility]  Chief Complaint  Patient presents with  . Follow-up    Sob with exertion,worse at times, dry cough occass.CPAP good.    Constitutional:  BP 102/60 (BP Location: Right Arm, Cuff Size: Normal)   Pulse 74   Temp 98.3 F (36.8 C) (Oral)   Ht 5\' 10"  (1.778 m)   Wt 187 lb 3.2 oz (84.9 kg)   SpO2 97%   BMI 26.86 kg/m   Past Medical History:  Nephrolithiasis, IBS, HLD, Colon polyps, Cerebral aneurysm, BPH, A fib, Cystoid macular degeneration, Idiopathic peripheral neuropathy  Brief Summary:  Maurice Wood is a 86 y.o. male with obstructive sleep apnea.  Subjective:   He was seen in ER for dyspnea in June.  CXR showed possible perihilar density.  Saw Dr. Haroldine Laws.  Had CT chest done.  Showed emphysema, ILD, and lung nodule.  He also had Echo done.  He is off prednisone.  Remains on imuran.  Has ophthalmology appointment next month.  Denies problems with swallowing.  No joint swelling.  No prior history of ILD.  Quit smoking in 1970's.  Used to get frequent episodes of pneumonia in 1970's.  Uses CPAP nightly.  Got a new mask and fits better.  Labs from 05/26/20: Na 142, K 3.8, CO2 23, Creatinine 0.89, Hb 14.1, WBC 6.7, PLT 173  Respiratory Exam:   Appearance - well kempt   ENMT - no sinus tenderness, no oral exudate, no LAN, Mallampati 2 airway, no stridor  Respiratory - equal breath sounds bilaterally, no wheezing or rales  CV - s1s2 regular rate and rhythm, 2/6 systolic murmurs  Ext - no clubbing, no edema  Skin - multiple areas of bruising on arms  Psych - normal mood and affect   Assessment/Plan:   Dyspnea on exertion. - recent CT chest shows changes of emphysema and ILD - will arrange for PFT and serology to further assess  Obstructive sleep apnea. - improved with new mask - continue CPAP 12 cm H2O - he is compliant with therapy  Lung nodule. - 4 mm RML - will need f/u CT chest w/o contrast in July  2022  Permanent A fib, mild/mod MR. - he has follow up with Dr. Haroldine Laws scheduled  Chorioretinal inflammation of Rt eye. - off prednisone - previously treated with MTX - currently on imuran - followed by Dr. Dwana Melena with ophthalmology at [facility] - uncertain if this has any relation to ILD findings  Idiopathic peripheral neuropathy. - followed by Dr. Tomi Likens with [facility] neurology  A total of  42 minutes spent addressing patient care issues on day of visit.   Follow up:   Patient Instructions  Lab tests today  Will schedule pulmonary function test  Follow up in 6 weeks with Dr. Halford Chessman or Nurse Practitioner   Signature:  Chesley Mires, MD [place] Pager: [phone] 06/27/2020, 10:11 AM  Flow Sheet      Chest imaging:   CT chest 06/10/20 >> atherosclerosis, mod HH, 4 mm RML nodule, calcified granulomas, bronchial thickening, mild centrilobular and paraseptal emphysema, areas of GGO with mild septal thickening and thickening of peribronchovascular interstitium in lower lobes b/l  Sleep tests:   PSG 11/09/17 >> AHI 40.8, SpO2 79%  CPAP titration 03/25/18 >> CPAP 12 cm H2O >> AHI 0.  CPAP 05/28/20 to 06/26/20 >> used on 30 of 30 nights with average 8 hrs 20 min.  Average AHI 1.3 with CPAP 12 cm H2O  Cardiac  tests:   Echo 06/09/20 >> EF 60 to 65%, mild elevation in PASP, mod LA/RA dilation, mod/severe MR, mild AR  Medications:   Allergies as of 06/27/2020      Reactions   Epinephrine Palpitations   REACTION: Increased Heart Rate   Nsaids Other (See Comments)   NOT WHILE TAKING ELIQUIS NOT WHILE TAKING ELIQUIS   Clarithromycin Other (See Comments)   REACTION: Headache headache headache   Iodinated Diagnostic Agents Other (See Comments)   Pt states "turning bright red"   Nimodipine    REACTION: Flushing   Other    Pt states "turning bright red"      Medication List       Accurate as of June 27, 2020 10:11 AM. If you have any  questions, ask your nurse or doctor.        STOP taking these medications   predniSONE 50 MG tablet Commonly known as: DELTASONE Stopped by: Chesley Mires, MD     TAKE these medications   azaTHIOprine 50 MG tablet Commonly known as: IMURAN Take 25 mg by mouth daily.   Biotin 1000 MCG Chew Chew 1,000 mcg by mouth daily.   cetirizine 10 MG tablet Commonly known as: ZYRTEC Take 10 mg by mouth daily.   chlorpheniramine 4 MG tablet Commonly known as: CHLOR-TRIMETON Take 4 mg by mouth daily as needed for allergies.   Eliquis 5 MG Tabs tablet Generic drug: apixaban Take 5 mg by mouth 2 (two) times daily.   famotidine 20 MG tablet Commonly known as: PEPCID Take 20 mg by mouth daily as needed for heartburn or indigestion.   finasteride 5 MG tablet Commonly known as: PROSCAR Take 5 mg by mouth daily.   furosemide 40 MG tablet Commonly known as: LASIX Take 1 tablet (40 mg total) by mouth daily.   omeprazole 20 MG capsule Commonly known as: PRILOSEC Take 20 mg by mouth daily.   potassium chloride SA 20 MEQ tablet Commonly known as: KLOR-CON Take 1 tablet (20 mEq total) by mouth daily.   vitamin B-12 1000 MCG tablet Commonly known as: CYANOCOBALAMIN Take 1,000 mcg by mouth daily.       Past Surgical History:  He  has a past surgical history that includes Cholecystectomy; inguinal and umbilical herniorrhaphy; Tonsillectomy and adenoidectomy (as a child); Cardiac catheterization (1999); Hernia repair; left heart catheterization with coronary angiogram (N/A, 06/01/2014); Lithotripsy; and Interstim Implant placement (04/2017).  Family History:  His family history includes Alcohol abuse in his father; Aneurysm in his father; Cancer in his father and maternal grandmother; Diabetes in his maternal uncle, paternal uncle, and son; Heart disease in his paternal grandfather; Sudden death in his mother.  Social History:  He  reports that he quit smoking about 48 years ago. His  smoking use included cigarettes. He has a 20.00 pack-year smoking history. He has never used smokeless tobacco. He reports current alcohol use of about 1.0 standard drink of alcohol per week. He reports that he does not use drugs.

## 2020-06-27 NOTE — H&P (View-Only) (Signed)
 CARDIOLOGY CLINIC NOTE  Patient ID: Maurice Wood, male   DOB: 04/16/1933, 86 y.o.   MRN: [id number]  HPI:  Maurice Wood is an 86 y.o. 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  Had episode of CP in January 2011 and underwent cath which showed normal coronaries with EF45% (? artificially low due to AF). Echo  2/11: 60-65%  Echo 7/16  EF 60% Mod MR.  ECHO 2017 EF 60-65%.   Echo 11/18 EF 60-65% severe MAC. Moderate MR Echo 9/20 EF 60% mild-mod MR mild AI Severe LAE Personally reviewed  He was seen in the ER on 05/26/20 for progressive SOB and cough. No CP. hstrop normal. (7,7). BNP 287 (in setting of chronic AF)  ECG AF 105. CXR showed:  1. Small amount of posterior bilateral pleural fluid 2. Mass-like density in the right infrahilar region which may be due to prominent vascularity versus mass/adenopathy.  CT deferred in ER. Given IV lasix with good output and discharged with oral lasix 40 mg daily x 5 days.   I saw him in June for ER f/u. ReDS 44% but felt ok. CT chest and echo ordered. CT as below suggestive of ILD/emphysema. Saw Dr. Sood this am and PFTs and serology ordered. ESR 4. Says he continues to be SOB with mild activity. No orthopnea or PND. LE edema resolved. Wearing CPAP. No CP. Taking lasix 40mg daily.   Echo 06/09/20  EF 60 to 65%, mild elevation in PASP, mod LA/RA dilation, mod/severe MR, mild AR   Studies:  Had CT 06/09/20 1. 4 mm right middle lobe pulmonary nodule, nonspecific, but statistically likely benign. No follow-up needed if patient is low-risk. Non-contrast chest CT can be considered in 12 months if patient is high-risk. This recommendation follows the consensus statement: Guidelines for Management of Incidental Pulmonary Nodules Detected on CT Images: From the Fleischner Society 2017; Radiology 2017;  284:228-243. 2. Aortic atherosclerosis, in addition to left main and 2 vessel coronary artery disease. 3. Findings in the lungs concerning for interstitial lung disease. Nonemergent outpatient referral to Pulmonology for further evaluation should be considered. 4. There are calcifications of the aortic valve and mitral annulus. Echocardiographic correlation for evaluation of potential valvular dysfunction may be warranted if clinically indicated.   Review of systems complete and found to be negative unless listed in HPI.   Past Medical History:  Diagnosis Date  . Allergy    rhinitis  . Atrial fibrillation (HCC) Jan 2007   echo 1-07 normal ejection fraction, no significant valvular disease. adenosine cardiolite 1-07  no evidence of ischemia. A 48 hour Holter monitor in 7-07 showed good rate controlw/ chronic atrial fibrillation. Cath 1/11 normal cors. EF 45%. Echo 2-11 60-65%  . Benign prostatic hypertrophy   . Cerebral aneurysm    followed by Dr Deveshwar  . Fatigue   . History of chest pain    a. s/p LHC 05/2014 with normal cors  . Hx of colonic polyps    diverticulosis  . Hyperlipidemia   . IBS (irritable bowel syndrome)   . Incontinence    Per pt 12/14/11  . Kidney stones   . Obesity   . Obstructive sleep apnea    noncompliant with CPAP  . Retinal vein occlusion     Current Outpatient Medications  Medication Sig Dispense Refill  . apixaban (ELIQUIS) 5 MG TABS tablet Take 5   mg by mouth 2 (two) times daily.    . azaTHIOprine (IMURAN) 50 MG tablet Take 25 mg by mouth daily.     . Biotin 1000 MCG CHEW Chew 1,000 mcg by mouth daily.    . cetirizine (ZYRTEC) 10 MG tablet Take 10 mg by mouth daily.    . chlorpheniramine (CHLOR-TRIMETON) 4 MG tablet Take 4 mg by mouth daily as needed for allergies.     . famotidine (PEPCID) 20 MG tablet Take 20 mg by mouth daily as needed for heartburn or indigestion.    . finasteride (PROSCAR) 5 MG tablet Take 5 mg by mouth daily.    .  furosemide (LASIX) 40 MG tablet Take 1 tablet (40 mg total) by mouth daily. 30 tablet 3  . omeprazole (PRILOSEC) 20 MG capsule Take 20 mg by mouth daily.     . potassium chloride SA (KLOR-CON) 20 MEQ tablet Take 1 tablet (20 mEq total) by mouth daily. 30 tablet 3  . vitamin B-12 (CYANOCOBALAMIN) 1000 MCG tablet Take 1,000 mcg by mouth daily.     No current facility-administered medications for this encounter.     PHYSICAL EXAM: Vitals:   06/27/20 1339  BP: 104/62  Pulse: 62  SpO2: 97%   Wt Readings from Last 3 Encounters:  06/27/20 84.8 kg (187 lb)  06/27/20 84.9 kg (187 lb 3.2 oz)  05/30/20 84 kg (185 lb 3.2 oz)   Physical exam: General:  Well appearing. No resp difficulty HEENT: normal Neck: supple. no JVD. Carotids 2+ bilat; no bruits. No lymphadenopathy or thryomegaly appreciated. Cor: PMI nondisplaced. Irregular rate & rhythm. Very soft MR Lungs: clear Abdomen: soft, nontender, nondistended. No hepatosplenomegaly. No bruits or masses. Good bowel sounds. Extremities: no cyanosis, clubbing, rash, edema Neuro: alert & orientedx3, cranial nerves grossly intact. moves all 4 extremities w/o difficulty. Affect pleasant   Lab Results  Component Value Date   CHOL 166 02/24/2014   HDL 53.80 02/24/2014   LDLCALC 84 02/24/2014   LDLDIRECT 97.4 11/09/2011   TRIG 143.0 02/24/2014   CHOLHDL 3 02/24/2014    ASSESSMENT & PLAN:  1. Chronic diastolic/valvular HF  - doesn't look markedly fluid overloaded on exam and BNP not that high but ReDS quite high persistenetly - suspect related to MR. ILD could also be playing a role but with low ESR doubt there is much active inflammation contributing to lung water reading - will continue lasix 40 daily with Kdur 20 daily can cut to every other day if gets dizzy - Echo 7/21 with EF 60-65% and moderate to severe MR - I do not think he is a surgical candidate but may be candidate for MitraClip. We discussed this and he is willing to consider -  Will plan TEE & R/L cath and discuss with valve team regarding possibly MitraClip  2. A-fib -  Permanent in setting of marked LAE. - Rate controlled. Continue eliquis 5 mg twice a day.  - No bleeding  3. Moderate-severe MR - Plana as above - Will check TEE & R/L cath and discuss with valve team regarding possibly MitraClip - I do not think he is a candidate for surgery  4. OSA - Compliant with CPAP  5. Abnormal chest CT - following with Pulmonary - await PFTs and serologies - ESR 4 -> doubt active inflammation  6. ? Mild dementia - seems to struggle with memory at times. - may warrant more formal evaluation  Total time spent 35 minutes. Over half that time spent discussing   above.    Ahamed Hofland MD 2:06 PM       

## 2020-06-27 NOTE — Addendum Note (Signed)
Addended by: Suzzanne Cloud E on: 06/27/2020 10:20 AM   Modules accepted: Orders

## 2020-06-27 NOTE — Progress Notes (Signed)
ReDS Vest / Clip - 06/27/20 1400      ReDS Vest / Clip   Station Marker D    Ruler Value 26    ReDS Value Range High volume overload    ReDS Actual Value 43

## 2020-06-27 NOTE — Progress Notes (Signed)
CARDIOLOGY CLINIC NOTE  Patient ID: Maurice Wood, male   DOB: 1933-06-01, 86 y.o.   MRN: [id number]  HPI:  Maurice Wood is an 86 y.o. 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  Had episode of CP in January 2011 and underwent cath which showed normal coronaries with EF45% (? artificially low due to AF). Echo  2/11: 60-65%  Echo 7/16  EF 60% Mod MR.  ECHO 2017 EF 60-65%.   Echo 11/18 EF 60-65% severe MAC. Moderate MR Echo 9/20 EF 60% mild-mod MR mild AI Severe LAE Personally reviewed  He was seen in the ER on 05/26/20 for progressive SOB and cough. No CP. hstrop normal. (7,7). BNP 287 (in setting of chronic AF)  ECG AF 105. CXR showed:  1. Small amount of posterior bilateral pleural fluid 2. Mass-like density in the right infrahilar region which may be due to prominent vascularity versus mass/adenopathy.  CT deferred in ER. Given IV lasix with good output and discharged with oral lasix 40 mg daily x 5 days.   I saw him in June for ER f/u. ReDS 44% but felt ok. CT chest and echo ordered. CT as below suggestive of ILD/emphysema. Saw Dr. Halford Chessman this am and PFTs and serology ordered. ESR 4. Says he continues to be SOB with mild activity. No orthopnea or PND. LE edema resolved. Wearing CPAP. No CP. Taking lasix 46m daily.   Echo 06/09/20  EF 60 to 65%, mild elevation in PASP, mod LA/RA dilation, mod/severe MR, mild AR   Studies:  Had CT 06/09/20 1. 4 mm right middle lobe pulmonary nodule, nonspecific, but statistically likely benign. No follow-up needed if patient is low-risk. Non-contrast chest CT can be considered in 12 months if patient is high-risk. This recommendation follows the consensus statement: Guidelines for Management of Incidental Pulmonary Nodules Detected on CT Images: From the Fleischner Society 2017; Radiology 2017;  284:228-243. 2. Aortic atherosclerosis, in addition to left main and 2 vessel coronary artery disease. 3. Findings in the lungs concerning for interstitial lung disease. Nonemergent outpatient referral to Pulmonology for further evaluation should be considered. 4. There are calcifications of the aortic valve and mitral annulus. Echocardiographic correlation for evaluation of potential valvular dysfunction may be warranted if clinically indicated.   Review of systems complete and found to be negative unless listed in HPI.   Past Medical History:  Diagnosis Date  . Allergy    rhinitis  . Atrial fibrillation ([facility] Jan 2007   echo 1-07 normal ejection fraction, no significant valvular disease. adenosine cardiolite 1-07  no evidence of ischemia. A 48 hour Holter monitor in 7-07 showed good rate controlw/ chronic atrial fibrillation. Cath 1/11 normal cors. EF 45%. Echo 2-11 60-65%  . Benign prostatic hypertrophy   . Cerebral aneurysm    followed by Dr DEstanislado Pandy . Fatigue   . History of chest pain    a. s/p LHC 05/2014 with normal cors  . Hx of colonic polyps    diverticulosis  . Hyperlipidemia   . IBS (irritable bowel syndrome)   . Incontinence    Per pt 12/14/11  . Kidney stones   . Obesity   . Obstructive sleep apnea    noncompliant with CPAP  . Retinal vein occlusion     Current Outpatient Medications  Medication Sig Dispense Refill  . apixaban (ELIQUIS) 5 MG TABS tablet Take 5  mg by mouth 2 (two) times daily.    Marland Kitchen azaTHIOprine (IMURAN) 50 MG tablet Take 25 mg by mouth daily.     . Biotin 1000 MCG CHEW Chew 1,000 mcg by mouth daily.    . cetirizine (ZYRTEC) 10 MG tablet Take 10 mg by mouth daily.    . chlorpheniramine (CHLOR-TRIMETON) 4 MG tablet Take 4 mg by mouth daily as needed for allergies.     . famotidine (PEPCID) 20 MG tablet Take 20 mg by mouth daily as needed for heartburn or indigestion.    . finasteride (PROSCAR) 5 MG tablet Take 5 mg by mouth daily.    .  furosemide (LASIX) 40 MG tablet Take 1 tablet (40 mg total) by mouth daily. 30 tablet 3  . omeprazole (PRILOSEC) 20 MG capsule Take 20 mg by mouth daily.     . potassium chloride SA (KLOR-CON) 20 MEQ tablet Take 1 tablet (20 mEq total) by mouth daily. 30 tablet 3  . vitamin B-12 (CYANOCOBALAMIN) 1000 MCG tablet Take 1,000 mcg by mouth daily.     No current facility-administered medications for this encounter.     PHYSICAL EXAM: Vitals:   06/27/20 1339  BP: 104/62  Pulse: 62  SpO2: 97%   Wt Readings from Last 3 Encounters:  06/27/20 84.8 kg (187 lb)  06/27/20 84.9 kg (187 lb 3.2 oz)  05/30/20 84 kg (185 lb 3.2 oz)   Physical exam: General:  Well appearing. No resp difficulty HEENT: normal Neck: supple. no JVD. Carotids 2+ bilat; no bruits. No lymphadenopathy or thryomegaly appreciated. Cor: PMI nondisplaced. Irregular rate & rhythm. Very soft MR Lungs: clear Abdomen: soft, nontender, nondistended. No hepatosplenomegaly. No bruits or masses. Good bowel sounds. Extremities: no cyanosis, clubbing, rash, edema Neuro: alert & orientedx3, cranial nerves grossly intact. moves all 4 extremities w/o difficulty. Affect pleasant   Lab Results  Component Value Date   CHOL 166 02/24/2014   HDL 53.80 02/24/2014   LDLCALC 84 02/24/2014   LDLDIRECT 97.4 11/09/2011   TRIG 143.0 02/24/2014   CHOLHDL 3 02/24/2014    ASSESSMENT & PLAN:  1. Chronic diastolic/valvular HF  - doesn't look markedly fluid overloaded on exam and BNP not that high but ReDS quite high persistenetly - suspect related to MR. ILD could also be playing a role but with low ESR doubt there is much active inflammation contributing to lung water reading - will continue lasix 40 daily with Kdur 20 daily can cut to every other day if gets dizzy - Echo 7/21 with EF 60-65% and moderate to severe MR - I do not think he is a surgical candidate but may be candidate for MitraClip. We discussed this and he is willing to consider -  Will plan TEE & R/L cath and discuss with valve team regarding possibly MitraClip  2. A-fib -  Permanent in setting of marked LAE. - Rate controlled. Continue eliquis 5 mg twice a day.  - No bleeding  3. Moderate-severe MR - Plana as above - Will check TEE & R/L cath and discuss with valve team regarding possibly MitraClip - I do not think he is a candidate for surgery  4. OSA - Compliant with CPAP  5. Abnormal chest CT - following with Pulmonary - await PFTs and serologies - ESR 4 -> doubt active inflammation  6. ? Mild dementia - seems to struggle with memory at times. - may warrant more formal evaluation  Total time spent 35 minutes. Over half that time spent discussing  above.    Glori Bickers MD 2:06 PM

## 2020-06-27 NOTE — Patient Instructions (Addendum)
  You are scheduled for a Cardiac Catheterization /TEE on Tuesday, July 27 with Dr. Glori Bickers.  1. Please arrive at the [facility] (Main Entrance A) at [facility]: [address] [place], [place] [address] at 8:30am (This time is two hours before your procedure to ensure your preparation). Free valet parking service is available.   Special note: Every effort is made to have your procedure done on time. Please understand that emergencies sometimes delay scheduled procedures.  2. Diet: Do not eat solid foods after midnight.  The patient may have clear liquids until 5am upon the day of the procedure.  3. Labs: Pre Procedure labs done 06/27/2020  4. Medication instructions in preparation for your procedure:   Contrast Allergy: Yes, Please take Prednisone 50mg  by mouth at: Thirteen hours prior to cath 11:30pm on Monday July 26 Seven hours prior to cath 6:00am on Tuesday July 27 And prior to leaving home please take last dose of Prednisone 50mg  and Benadryl 50mg  by mouth. At 8:00 am   Stop taking, Lasix (Furosemide)  Tuesday, July 27,   On the morning of your procedure, take your Aspirin/Eliquis and any morning medicines NOT listed above.  You may use sips of water.  5. Plan for one night stay--bring personal belongings. 6. Bring a current list of your medications and current insurance cards. 7. You MUST have a responsible person to drive you home. 8. Someone MUST be with you the first 24 hours after you arrive home or your discharge will be delayed. 9. Please wear clothes that are easy to get on and off and wear slip-on shoes.  Thank you for allowing [place] to care for you!   -- [facility]   Your physician recommends that you schedule a follow-up appointment in: 3 months with Dr Haroldine Laws

## 2020-06-27 NOTE — Patient Instructions (Signed)
Lab tests today ? ?Will schedule pulmonary function test ? ?Follow up in 6 weeks with Dr. Mardie Kellen or Nurse Practitioner ?

## 2020-06-27 NOTE — H&P (View-Only) (Signed)
 CARDIOLOGY CLINIC NOTE  Patient ID: Maurice Wood, male   DOB: 03/20/1933, 86 y.o.   MRN: [id number]  HPI:  Maurice Wood is an 86 y.o. 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  Had episode of CP in January 2011 and underwent cath which showed normal coronaries with EF45% (? artificially low due to AF). Echo  2/11: 60-65%  Echo 7/16  EF 60% Mod MR.  ECHO 2017 EF 60-65%.   Echo 11/18 EF 60-65% severe MAC. Moderate MR Echo 9/20 EF 60% mild-mod MR mild AI Severe LAE Personally reviewed  He was seen in the ER on 05/26/20 for progressive SOB and cough. No CP. hstrop normal. (7,7). BNP 287 (in setting of chronic AF)  ECG AF 105. CXR showed:  1. Small amount of posterior bilateral pleural fluid 2. Mass-like density in the right infrahilar region which may be due to prominent vascularity versus mass/adenopathy.  CT deferred in ER. Given IV lasix with good output and discharged with oral lasix 40 mg daily x 5 days.   I saw him in June for ER f/u. ReDS 44% but felt ok. CT chest and echo ordered. CT as below suggestive of ILD/emphysema. Saw Dr. Sood this am and PFTs and serology ordered. ESR 4. Says he continues to be SOB with mild activity. No orthopnea or PND. LE edema resolved. Wearing CPAP. No CP. Taking lasix 40mg daily.   Echo 06/09/20  EF 60 to 65%, mild elevation in PASP, mod LA/RA dilation, mod/severe MR, mild AR   Studies:  Had CT 06/09/20 1. 4 mm right middle lobe pulmonary nodule, nonspecific, but statistically likely benign. No follow-up needed if patient is low-risk. Non-contrast chest CT can be considered in 12 months if patient is high-risk. This recommendation follows the consensus statement: Guidelines for Management of Incidental Pulmonary Nodules Detected on CT Images: From the Fleischner Society 2017; Radiology 2017;  284:228-243. 2. Aortic atherosclerosis, in addition to left main and 2 vessel coronary artery disease. 3. Findings in the lungs concerning for interstitial lung disease. Nonemergent outpatient referral to Pulmonology for further evaluation should be considered. 4. There are calcifications of the aortic valve and mitral annulus. Echocardiographic correlation for evaluation of potential valvular dysfunction may be warranted if clinically indicated.   Review of systems complete and found to be negative unless listed in HPI.   Past Medical History:  Diagnosis Date  . Allergy    rhinitis  . Atrial fibrillation (HCC) Jan 2007   echo 1-07 normal ejection fraction, no significant valvular disease. adenosine cardiolite 1-07  no evidence of ischemia. A 48 hour Holter monitor in 7-07 showed good rate controlw/ chronic atrial fibrillation. Cath 1/11 normal cors. EF 45%. Echo 2-11 60-65%  . Benign prostatic hypertrophy   . Cerebral aneurysm    followed by Dr Deveshwar  . Fatigue   . History of chest pain    a. s/p LHC 05/2014 with normal cors  . Hx of colonic polyps    diverticulosis  . Hyperlipidemia   . IBS (irritable bowel syndrome)   . Incontinence    Per pt 12/14/11  . Kidney stones   . Obesity   . Obstructive sleep apnea    noncompliant with CPAP  . Retinal vein occlusion     Current Outpatient Medications  Medication Sig Dispense Refill  . apixaban (ELIQUIS) 5 MG TABS tablet Take 5   mg by mouth 2 (two) times daily.    . azaTHIOprine (IMURAN) 50 MG tablet Take 25 mg by mouth daily.     . Biotin 1000 MCG CHEW Chew 1,000 mcg by mouth daily.    . cetirizine (ZYRTEC) 10 MG tablet Take 10 mg by mouth daily.    . chlorpheniramine (CHLOR-TRIMETON) 4 MG tablet Take 4 mg by mouth daily as needed for allergies.     . famotidine (PEPCID) 20 MG tablet Take 20 mg by mouth daily as needed for heartburn or indigestion.    . finasteride (PROSCAR) 5 MG tablet Take 5 mg by mouth daily.    .  furosemide (LASIX) 40 MG tablet Take 1 tablet (40 mg total) by mouth daily. 30 tablet 3  . omeprazole (PRILOSEC) 20 MG capsule Take 20 mg by mouth daily.     . potassium chloride SA (KLOR-CON) 20 MEQ tablet Take 1 tablet (20 mEq total) by mouth daily. 30 tablet 3  . vitamin B-12 (CYANOCOBALAMIN) 1000 MCG tablet Take 1,000 mcg by mouth daily.     No current facility-administered medications for this encounter.     PHYSICAL EXAM: Vitals:   06/27/20 1339  BP: 104/62  Pulse: 62  SpO2: 97%   Wt Readings from Last 3 Encounters:  06/27/20 84.8 kg (187 lb)  06/27/20 84.9 kg (187 lb 3.2 oz)  05/30/20 84 kg (185 lb 3.2 oz)   Physical exam: General:  Well appearing. No resp difficulty HEENT: normal Neck: supple. no JVD. Carotids 2+ bilat; no bruits. No lymphadenopathy or thryomegaly appreciated. Cor: PMI nondisplaced. Irregular rate & rhythm. Very soft MR Lungs: clear Abdomen: soft, nontender, nondistended. No hepatosplenomegaly. No bruits or masses. Good bowel sounds. Extremities: no cyanosis, clubbing, rash, edema Neuro: alert & orientedx3, cranial nerves grossly intact. moves all 4 extremities w/o difficulty. Affect pleasant   Lab Results  Component Value Date   CHOL 166 02/24/2014   HDL 53.80 02/24/2014   LDLCALC 84 02/24/2014   LDLDIRECT 97.4 11/09/2011   TRIG 143.0 02/24/2014   CHOLHDL 3 02/24/2014    ASSESSMENT & PLAN:  1. Chronic diastolic/valvular HF  - doesn't look markedly fluid overloaded on exam and BNP not that high but ReDS quite high persistenetly - suspect related to MR. ILD could also be playing a role but with low ESR doubt there is much active inflammation contributing to lung water reading - will continue lasix 40 daily with Kdur 20 daily can cut to every other day if gets dizzy - Echo 7/21 with EF 60-65% and moderate to severe MR - I do not think he is a surgical candidate but may be candidate for MitraClip. We discussed this and he is willing to consider -  Will plan TEE & R/L cath and discuss with valve team regarding possibly MitraClip  2. A-fib -  Permanent in setting of marked LAE. - Rate controlled. Continue eliquis 5 mg twice a day.  - No bleeding  3. Moderate-severe MR - Plana as above - Will check TEE & R/L cath and discuss with valve team regarding possibly MitraClip - I do not think he is a candidate for surgery  4. OSA - Compliant with CPAP  5. Abnormal chest CT - following with Pulmonary - await PFTs and serologies - ESR 4 -> doubt active inflammation  6. ? Mild dementia - seems to struggle with memory at times. - may warrant more formal evaluation  Total time spent 35 minutes. Over half that time spent discussing   above.    Tarus Briski MD 2:06 PM       

## 2020-06-28 LAB — RHEUMATOID FACTOR: Rheumatoid fact SerPl-aCnc: <14 IU/mL (ref <14)

## 2020-06-28 LAB — JO-1 ANTIBODY-IGG: Jo-1 Autoabs: <1 AI

## 2020-06-28 LAB — ANA W/REFLEX IF POSITIVE: Anti Nuclear Antibody (ANA): NEGATIVE

## 2020-06-28 LAB — ANCA SCREEN W REFLEX TITER: ANCA Screen: NEGATIVE

## 2020-07-01 DIAGNOSIS — G4733 Obstructive sleep apnea (adult) (pediatric): Secondary | ICD-10-CM | POA: Diagnosis not present

## 2020-07-04 ENCOUNTER — Other Ambulatory Visit (HOSPITAL_COMMUNITY)
Admission: RE | Admit: 2020-07-04 | Discharge: 2020-07-04 | Disposition: A | Discharge status: Home / Self Care | Payer: PPO | Source: Ambulatory Visit | Attending: Internal Medicine | Admitting: Internal Medicine

## 2020-07-04 DIAGNOSIS — Z01812 Encounter for preprocedural laboratory examination: Secondary | ICD-10-CM | POA: Diagnosis not present

## 2020-07-04 DIAGNOSIS — Z20822 Contact with and (suspected) exposure to covid-19: Secondary | ICD-10-CM | POA: Insufficient documentation

## 2020-07-04 LAB — SARS CORONAVIRUS 2 (TAT 6-24 HRS): SARS Coronavirus 2: NEGATIVE

## 2020-07-05 ENCOUNTER — Other Ambulatory Visit: Payer: Self-pay

## 2020-07-05 ENCOUNTER — Encounter (HOSPITAL_COMMUNITY): Payer: Self-pay | Admitting: Internal Medicine

## 2020-07-05 ENCOUNTER — Ambulatory Visit (HOSPITAL_BASED_OUTPATIENT_CLINIC_OR_DEPARTMENT_OTHER)
Admission: RE | Admit: 2020-07-05 | Discharge: 2020-07-05 | Disposition: A | Discharge status: Home / Self Care | Payer: PPO | Source: Ambulatory Visit | Attending: Internal Medicine | Admitting: Internal Medicine

## 2020-07-05 ENCOUNTER — Ambulatory Visit (HOSPITAL_COMMUNITY)
Admission: RE | Admit: 2020-07-05 | Discharge: 2020-07-05 | Disposition: A | Discharge status: Home / Self Care | Payer: PPO | Attending: Internal Medicine | Admitting: Internal Medicine

## 2020-07-05 ENCOUNTER — Encounter (HOSPITAL_COMMUNITY)
Admission: RE | Disposition: A | Discharge status: Home / Self Care | Payer: Self-pay | Source: Home / Self Care | Attending: Internal Medicine

## 2020-07-05 DIAGNOSIS — Z6826 Body mass index (BMI) 26.0-26.9, adult: Secondary | ICD-10-CM | POA: Insufficient documentation

## 2020-07-05 DIAGNOSIS — I340 Nonrheumatic mitral (valve) insufficiency: Secondary | ICD-10-CM | POA: Diagnosis not present

## 2020-07-05 DIAGNOSIS — I351 Nonrheumatic aortic (valve) insufficiency: Secondary | ICD-10-CM

## 2020-07-05 DIAGNOSIS — I5032 Chronic diastolic (congestive) heart failure: Secondary | ICD-10-CM | POA: Diagnosis not present

## 2020-07-05 DIAGNOSIS — Z7901 Long term (current) use of anticoagulants: Secondary | ICD-10-CM | POA: Diagnosis not present

## 2020-07-05 DIAGNOSIS — I482 Chronic atrial fibrillation, unspecified: Secondary | ICD-10-CM | POA: Diagnosis not present

## 2020-07-05 DIAGNOSIS — E785 Hyperlipidemia, unspecified: Secondary | ICD-10-CM | POA: Insufficient documentation

## 2020-07-05 DIAGNOSIS — N400 Enlarged prostate without lower urinary tract symptoms: Secondary | ICD-10-CM | POA: Insufficient documentation

## 2020-07-05 DIAGNOSIS — Z79899 Other long term (current) drug therapy: Secondary | ICD-10-CM | POA: Diagnosis not present

## 2020-07-05 DIAGNOSIS — I110 Hypertensive heart disease with heart failure: Secondary | ICD-10-CM | POA: Insufficient documentation

## 2020-07-05 DIAGNOSIS — G4733 Obstructive sleep apnea (adult) (pediatric): Secondary | ICD-10-CM | POA: Insufficient documentation

## 2020-07-05 DIAGNOSIS — K589 Irritable bowel syndrome without diarrhea: Secondary | ICD-10-CM | POA: Insufficient documentation

## 2020-07-05 DIAGNOSIS — E669 Obesity, unspecified: Secondary | ICD-10-CM | POA: Diagnosis not present

## 2020-07-05 HISTORY — PX: TEE WITHOUT CARDIOVERSION: SHX5443

## 2020-07-05 LAB — BASIC METABOLIC PANEL
Anion gap: 12 (ref 5–15)
BUN: 17 mg/dL (ref 8–23)
CO2: 22 mmol/L (ref 22–32)
Calcium: 9.6 mg/dL (ref 8.9–10.3)
Chloride: 103 mmol/L (ref 98–111)
Creatinine, Ser: 1.25 mg/dL — ABNORMAL HIGH (ref 0.61–1.24)
GFR calc Af Amer: >60 mL/min (ref >60)
GFR calc non Af Amer: 53 mL/min — ABNORMAL LOW (ref >60)
Glucose, Bld: 204 mg/dL — ABNORMAL HIGH (ref 70–99)
Potassium: 4.4 mmol/L (ref 3.5–5.1)
Sodium: 137 mmol/L (ref 135–145)

## 2020-07-05 LAB — CBC
HCT: 46.8 % (ref 39.0–52.0)
Hemoglobin: 14.9 g/dL (ref 13.0–17.0)
MCH: 29.3 pg (ref 26.0–34.0)
MCHC: 31.8 g/dL (ref 30.0–36.0)
MCV: 92.1 fL (ref 80.0–100.0)
Platelets: 189 10*3/uL (ref 150–400)
RBC: 5.08 MIL/uL (ref 4.22–5.81)
RDW: 13.8 % (ref 11.5–15.5)
WBC: 3.7 10*3/uL — ABNORMAL LOW (ref 4.0–10.5)
nRBC: 0 % (ref 0.0–0.2)

## 2020-07-05 SURGERY — ECHOCARDIOGRAM, TRANSESOPHAGEAL
Anesthesia: Moderate Sedation

## 2020-07-05 MED ORDER — ASPIRIN 81 MG PO CHEW: 81.0000 mg | CHEWABLE_TABLET | ORAL | Status: DC

## 2020-07-05 MED ORDER — MIDAZOLAM HCL (PF) 5 MG/ML IJ SOLN
INTRAMUSCULAR | Status: AC
  Filled 2020-07-05: qty 2

## 2020-07-05 MED ORDER — SODIUM CHLORIDE 0.9 % IV SOLN: 250.0000 mL | INTRAVENOUS | Status: DC | PRN

## 2020-07-05 MED ORDER — SODIUM CHLORIDE 0.9% FLUSH: 3.0000 mL | INTRAVENOUS | Status: DC | PRN

## 2020-07-05 MED ORDER — SODIUM CHLORIDE 0.9 % IV SOLN: INTRAVENOUS | Status: DC

## 2020-07-05 MED ORDER — BUTAMBEN-TETRACAINE-BENZOCAINE 2-2-14 % EX AERO
INHALATION_SPRAY | CUTANEOUS | Status: DC | PRN
  Administered 2020-07-05: 2 via TOPICAL

## 2020-07-05 MED ORDER — MIDAZOLAM HCL (PF) 10 MG/2ML IJ SOLN
INTRAMUSCULAR | Status: DC | PRN
  Administered 2020-07-05 (×2): 2 mg via INTRAVENOUS

## 2020-07-05 MED ORDER — FENTANYL CITRATE (PF) 100 MCG/2ML IJ SOLN
INTRAMUSCULAR | Status: AC
  Filled 2020-07-05: qty 2

## 2020-07-05 MED ORDER — FENTANYL CITRATE (PF) 100 MCG/2ML IJ SOLN
INTRAMUSCULAR | Status: DC | PRN
  Administered 2020-07-05: 25 ug via INTRAVENOUS

## 2020-07-05 NOTE — CV Procedure (Signed)
    TRANSESOPHAGEAL ECHOCARDIOGRAM   NAME:  Kionte Baumgardner Barkan   MRN: [id number] DOB:  [birth year]   ADMIT DATE: 07/05/2020  INDICATIONS:  Mitral regurgitation  PROCEDURE:   Informed consent was obtained prior to the procedure. The risks, benefits and alternatives for the procedure were discussed and the patient comprehended these risks.  Risks include, but are not limited to, cough, sore throat, vomiting, nausea, somnolence, esophageal and stomach trauma or perforation, bleeding, low blood pressure, aspiration, pneumonia, infection, trauma to the teeth and death.    After a procedural time-out, the patient was given 4 mg versed and 25 mcg fentanyl for moderate sedation.  The oropharynx was anesthetized with cetacaine spray  The transesophageal probe was inserted in the esophagus and stomach without difficulty and multiple views were obtained.    COMPLICATIONS:    There were no immediate complications.  FINDINGS:  LEFT VENTRICLE: EF = 60-65%. No regional wall motion abnormalities.  RIGHT VENTRICLE: Normal size and function.   LEFT ATRIUM: Markedly dilated  LEFT ATRIAL APPENDAGE: No thrombus.   RIGHT ATRIUM: Markedly dialted  AORTIC VALVE:  Trileaflet. Mild calcification mild AI  MITRAL VALVE:    Thickened with mild bileaflet prolapse.  2 jets of MR with the anterior jet being the larger of the two. Likely moderate (2-3+ MR). Unable to get transgastric vies due to probable stricture  TRICUSPID VALVE: Normal. Trivial TR  PULMONIC VALVE: Grossly normal. Mild PR  INTERATRIAL SEPTUM: No PFO or ASD.  PERICARDIUM: No effusion  DESCENDING AORTA: Not visualized     Benay Spice 9:16 AM

## 2020-07-05 NOTE — Interval H&P Note (Signed)
History and Physical Interval Note:  07/05/2020 8:27 AM  Maurice Wood  has presented today for surgery, with the diagnosis of mitral regurgitation  The various methods of treatment have been discussed with the patient and family. After consideration of risks, benefits and other options for treatment, the patient has consented to  Procedure(s): TRANSESOPHAGEAL ECHOCARDIOGRAM (TEE) (N/A) as a surgical intervention.  The patient's history has been reviewed, patient examined, no change in status, stable for surgery.  I have reviewed the patient's chart and labs.  Questions were answered to the patient's satisfaction.     Glendene Wyer

## 2020-07-05 NOTE — Progress Notes (Signed)
  Echocardiogram Echocardiogram Transesophageal has been performed.  Jennette Dubin 07/05/2020, 9:20 AM

## 2020-07-05 NOTE — Interval H&P Note (Signed)
History and Physical Interval Note:  07/05/2020 8:40 AM  Maurice Wood  has presented today for surgery, with the diagnosis of MR.  The various methods of treatment have been discussed with the patient and family. After consideration of risks, benefits and other options for treatment, the patient has consented to  Procedure(s): TRANSESOPHAGEAL ECHOCARDIOGRAM (TEE) (N/A) as a surgical intervention.  The patient's history has been reviewed, patient examined, no change in status, stable for surgery.  I have reviewed the patient's chart and labs.  Questions were answered to the patient's satisfaction.     Danon Lograsso

## 2020-07-05 NOTE — Discharge Instructions (Signed)
Transesophageal Echocardiogram Transesophageal echocardiogram (TEE) is a test that uses sound waves to take pictures of your heart. TEE is done by passing a flexible tube down the esophagus. The esophagus is the tube that carries food from the throat to the stomach. The pictures give detailed images of your heart. This can help your doctor see if there are problems with your heart. What happens before the procedure? Staying hydrated Follow instructions from your doctor about hydration, which may include:  Up to 3 hours before the procedure - you may continue to drink clear liquids, such as: ? Water. ? Clear fruit juice. ? Black coffee. ? Plain tea.  Eating and drinking Follow instructions from your doctor about eating and drinking, which may include:  8 hours before the procedure - stop eating heavy meals or foods such as meat, fried foods, or fatty foods.  6 hours before the procedure - stop eating light meals or foods, such as toast or cereal.  6 hours before the procedure - stop drinking milk or drinks that contain milk.  3 hours before the procedure - stop drinking clear liquids. General instructions  You will need to take out any dentures or retainers.  Plan to have someone take you home from the hospital or clinic.  If you will be going home right after the procedure, plan to have someone with you for 24 hours.  Ask your doctor about: ? Changing or stopping your normal medicines. This is important if you take diabetes medicines or blood thinners. ? Taking over-the-counter medicines, vitamins, herbs, and supplements. ? Taking medicines such as aspirin and ibuprofen. These medicines can thin your blood. Do not take these medicines unless your doctor tells you to take them. What happens during the procedure?  To lower your risk of infection, your doctors will wash or clean their hands.  An IV will be put into one of your veins.  You will be given a medicine to help you  relax (sedative).  A medicine may be sprayed or gargled. This numbs the back of your throat.  Your blood pressure, heart rate, and breathing will be watched.  You may be asked to lay on your left side.  A bite block will be placed in your mouth. This keeps you from biting the tube.  The tip of the TEE probe will be placed into the back of your mouth.  You will be asked to swallow.  Your doctor will take pictures of your heart.  The probe and bite block will be taken out. The procedure may vary among doctors and hospitals. What happens after the procedure?   Your blood pressure, heart rate, breathing rate, and blood oxygen level will be watched until the medicines you were given have worn off.  When you first wake up, your throat may feel sore and numb. This will get better over time. You will not be allowed to eat or drink until the numbness has gone away.  Do not drive for 24 hours if you were given a medicine to help you relax. Summary  TEE is a test that uses sound waves to take pictures of your heart.  You will be given a medicine to help you relax.  Do not drive for 24 hours if you were given a medicine to help you relax. This information is not intended to replace advice given to you by your health care provider. Make sure you discuss any questions you have with your health care provider. Document Revised:   08/15/2018 Document Reviewed: 02/27/2017 Elsevier Patient Education  2020 Elsevier Inc.  

## 2020-07-06 ENCOUNTER — Encounter (HOSPITAL_COMMUNITY): Payer: Self-pay | Admitting: Internal Medicine

## 2020-07-08 ENCOUNTER — Ambulatory Visit (HOSPITAL_COMMUNITY)
Admission: RE | Admit: 2020-07-08 | Discharge: 2020-07-08 | Disposition: A | Discharge status: Home / Self Care | Payer: PPO | Attending: Internal Medicine | Admitting: Internal Medicine

## 2020-07-08 ENCOUNTER — Other Ambulatory Visit: Payer: Self-pay

## 2020-07-08 ENCOUNTER — Encounter (HOSPITAL_COMMUNITY): Payer: Self-pay | Admitting: Internal Medicine

## 2020-07-08 ENCOUNTER — Encounter (HOSPITAL_COMMUNITY)
Admission: RE | Disposition: A | Discharge status: Home / Self Care | Payer: Self-pay | Source: Home / Self Care | Attending: Internal Medicine

## 2020-07-08 DIAGNOSIS — N400 Enlarged prostate without lower urinary tract symptoms: Secondary | ICD-10-CM | POA: Insufficient documentation

## 2020-07-08 DIAGNOSIS — E785 Hyperlipidemia, unspecified: Secondary | ICD-10-CM | POA: Diagnosis not present

## 2020-07-08 DIAGNOSIS — F0390 Unspecified dementia without behavioral disturbance: Secondary | ICD-10-CM | POA: Insufficient documentation

## 2020-07-08 DIAGNOSIS — I5032 Chronic diastolic (congestive) heart failure: Secondary | ICD-10-CM | POA: Insufficient documentation

## 2020-07-08 DIAGNOSIS — K589 Irritable bowel syndrome without diarrhea: Secondary | ICD-10-CM | POA: Insufficient documentation

## 2020-07-08 DIAGNOSIS — J849 Interstitial pulmonary disease, unspecified: Secondary | ICD-10-CM | POA: Diagnosis not present

## 2020-07-08 DIAGNOSIS — I110 Hypertensive heart disease with heart failure: Secondary | ICD-10-CM | POA: Insufficient documentation

## 2020-07-08 DIAGNOSIS — Z7901 Long term (current) use of anticoagulants: Secondary | ICD-10-CM | POA: Insufficient documentation

## 2020-07-08 DIAGNOSIS — G4733 Obstructive sleep apnea (adult) (pediatric): Secondary | ICD-10-CM | POA: Diagnosis not present

## 2020-07-08 DIAGNOSIS — I340 Nonrheumatic mitral (valve) insufficiency: Secondary | ICD-10-CM | POA: Insufficient documentation

## 2020-07-08 DIAGNOSIS — E669 Obesity, unspecified: Secondary | ICD-10-CM | POA: Diagnosis not present

## 2020-07-08 DIAGNOSIS — Z6826 Body mass index (BMI) 26.0-26.9, adult: Secondary | ICD-10-CM | POA: Diagnosis not present

## 2020-07-08 DIAGNOSIS — I482 Chronic atrial fibrillation, unspecified: Secondary | ICD-10-CM | POA: Insufficient documentation

## 2020-07-08 DIAGNOSIS — Z79899 Other long term (current) drug therapy: Secondary | ICD-10-CM | POA: Insufficient documentation

## 2020-07-08 HISTORY — PX: RIGHT/LEFT HEART CATH AND CORONARY ANGIOGRAPHY: CATH118266

## 2020-07-08 LAB — POCT I-STAT 7, (LYTES, BLD GAS, ICA,H+H)
Acid-base deficit: 3 mmol/L — ABNORMAL HIGH (ref 0.0–2.0)
Bicarbonate: 22.6 mmol/L (ref 20.0–28.0)
Calcium, Ion: 1.19 mmol/L (ref 1.15–1.40)
HCT: 41 % (ref 39.0–52.0)
Hemoglobin: 13.9 g/dL (ref 13.0–17.0)
O2 Saturation: 97 %
Potassium: 4 mmol/L (ref 3.5–5.1)
Sodium: 143 mmol/L (ref 135–145)
TCO2: 24 mmol/L (ref 22–32)
pCO2 arterial: 39.6 mmHg (ref 32.0–48.0)
pH, Arterial: 7.365 (ref 7.350–7.450)
pO2, Arterial: 91 mmHg (ref 83.0–108.0)

## 2020-07-08 LAB — POCT I-STAT EG7
Acid-base deficit: 2 mmol/L (ref 0.0–2.0)
Acid-base deficit: 1 mmol/L (ref 0.0–2.0)
Bicarbonate: 23.3 mmol/L (ref 20.0–28.0)
Bicarbonate: 24.7 mmol/L (ref 20.0–28.0)
Calcium, Ion: 1.07 mmol/L — ABNORMAL LOW (ref 1.15–1.40)
Calcium, Ion: 1.14 mmol/L — ABNORMAL LOW (ref 1.15–1.40)
HCT: 39 % (ref 39.0–52.0)
HCT: 41 % (ref 39.0–52.0)
Hemoglobin: 13.3 g/dL (ref 13.0–17.0)
Hemoglobin: 13.9 g/dL (ref 13.0–17.0)
O2 Saturation: 70 %
O2 Saturation: 71 %
Potassium: 3.8 mmol/L (ref 3.5–5.1)
Potassium: 4.2 mmol/L (ref 3.5–5.1)
Sodium: 144 mmol/L (ref 135–145)
Sodium: 143 mmol/L (ref 135–145)
TCO2: 25 mmol/L (ref 22–32)
TCO2: 26 mmol/L (ref 22–32)
pCO2, Ven: 41.5 mmHg — ABNORMAL LOW (ref 44.0–60.0)
pCO2, Ven: 44.2 mmHg (ref 44.0–60.0)
pH, Ven: 7.358 (ref 7.250–7.430)
pH, Ven: 7.355 (ref 7.250–7.430)
pO2, Ven: 39 mmHg (ref 32.0–45.0)
pO2, Ven: 39 mmHg (ref 32.0–45.0)

## 2020-07-08 SURGERY — RIGHT/LEFT HEART CATH AND CORONARY ANGIOGRAPHY
Anesthesia: LOCAL

## 2020-07-08 MED ORDER — DIPHENHYDRAMINE HCL 50 MG/ML IJ SOLN
25.0000 mg | Freq: Once | INTRAMUSCULAR | Status: AC
  Administered 2020-07-08: 25 mg via INTRAVENOUS
  Filled 2020-07-08: qty 1

## 2020-07-08 MED ORDER — SODIUM CHLORIDE 0.9% FLUSH: 3.0000 mL | Freq: Two times a day (BID) | INTRAVENOUS | Status: DC

## 2020-07-08 MED ORDER — HEPARIN (PORCINE) IN NACL 1000-0.9 UT/500ML-% IV SOLN
INTRAVENOUS | Status: DC | PRN
  Administered 2020-07-08 (×2): 500 mL

## 2020-07-08 MED ORDER — IOHEXOL 350 MG/ML SOLN
INTRAVENOUS | Status: DC | PRN
  Administered 2020-07-08: 55 mL

## 2020-07-08 MED ORDER — ONDANSETRON HCL 4 MG/2ML IJ SOLN: 4.0000 mg | Freq: Four times a day (QID) | INTRAMUSCULAR | Status: DC | PRN

## 2020-07-08 MED ORDER — VERAPAMIL HCL 2.5 MG/ML IV SOLN
INTRAVENOUS | Status: DC | PRN
  Administered 2020-07-08: 10 mL via INTRA_ARTERIAL

## 2020-07-08 MED ORDER — MIDAZOLAM HCL 2 MG/2ML IJ SOLN
INTRAMUSCULAR | Status: DC | PRN
  Administered 2020-07-08: 1 mg via INTRAVENOUS

## 2020-07-08 MED ORDER — HEPARIN SODIUM (PORCINE) 1000 UNIT/ML IJ SOLN
INTRAMUSCULAR | Status: AC
  Filled 2020-07-08: qty 1

## 2020-07-08 MED ORDER — SODIUM CHLORIDE 0.9 % IV SOLN: 250.0000 mL | INTRAVENOUS | Status: DC | PRN

## 2020-07-08 MED ORDER — ASPIRIN 81 MG PO CHEW
81.0000 mg | CHEWABLE_TABLET | ORAL | Status: AC
  Administered 2020-07-08: 81 mg via ORAL
  Filled 2020-07-08: qty 1

## 2020-07-08 MED ORDER — SODIUM CHLORIDE 0.9 % IV SOLN: INTRAVENOUS | Status: DC

## 2020-07-08 MED ORDER — SODIUM CHLORIDE 0.9% FLUSH: 3.0000 mL | INTRAVENOUS | Status: DC | PRN

## 2020-07-08 MED ORDER — HYDRALAZINE HCL 20 MG/ML IJ SOLN: 10.0000 mg | INTRAMUSCULAR | Status: DC | PRN

## 2020-07-08 MED ORDER — VERAPAMIL HCL 2.5 MG/ML IV SOLN
INTRAVENOUS | Status: AC
  Filled 2020-07-08: qty 2

## 2020-07-08 MED ORDER — LIDOCAINE HCL (PF) 1 % IJ SOLN
INTRAMUSCULAR | Status: AC
  Filled 2020-07-08: qty 30

## 2020-07-08 MED ORDER — HEPARIN (PORCINE) IN NACL 1000-0.9 UT/500ML-% IV SOLN
INTRAVENOUS | Status: AC
  Filled 2020-07-08: qty 1000

## 2020-07-08 MED ORDER — ACETAMINOPHEN 325 MG PO TABS: 650.0000 mg | ORAL_TABLET | ORAL | Status: DC | PRN

## 2020-07-08 MED ORDER — MIDAZOLAM HCL 2 MG/2ML IJ SOLN
INTRAMUSCULAR | Status: AC
  Filled 2020-07-08: qty 2

## 2020-07-08 MED ORDER — FENTANYL CITRATE (PF) 100 MCG/2ML IJ SOLN
INTRAMUSCULAR | Status: AC
  Filled 2020-07-08: qty 2

## 2020-07-08 MED ORDER — METHYLPREDNISOLONE SODIUM SUCC 125 MG IJ SOLR
125.0000 mg | Freq: Once | INTRAMUSCULAR | Status: AC
  Administered 2020-07-08: 125 mg via INTRAVENOUS
  Filled 2020-07-08: qty 2

## 2020-07-08 MED ORDER — LABETALOL HCL 5 MG/ML IV SOLN: 10.0000 mg | INTRAVENOUS | Status: DC | PRN

## 2020-07-08 MED ORDER — HEPARIN SODIUM (PORCINE) 1000 UNIT/ML IJ SOLN
INTRAMUSCULAR | Status: DC | PRN
  Administered 2020-07-08: 4000 [IU] via INTRAVENOUS

## 2020-07-08 MED ORDER — LIDOCAINE HCL (PF) 1 % IJ SOLN
INTRAMUSCULAR | Status: DC | PRN
  Administered 2020-07-08: 3 mL

## 2020-07-08 MED ORDER — FENTANYL CITRATE (PF) 100 MCG/2ML IJ SOLN
INTRAMUSCULAR | Status: DC | PRN
  Administered 2020-07-08: 25 ug via INTRAVENOUS

## 2020-07-08 SURGICAL SUPPLY — 14 items
CATH 5FR JL3.5 JR4 ANG PIG MP (CATHETERS) ×2 IMPLANT
CATH INFINITI 5 FR 3DRC (CATHETERS) ×2 IMPLANT
CATH INFINITI 5FR AL1 (CATHETERS) ×2 IMPLANT
CATH OPTITORQUE TIG 4.0 5F (CATHETERS) ×2 IMPLANT
CATH SWAN GANZ 7F STRAIGHT (CATHETERS) ×2 IMPLANT
DEVICE RAD COMP TR BAND LRG (VASCULAR PRODUCTS) ×2 IMPLANT
GLIDESHEATH SLEND SS 6F .021 (SHEATH) ×2 IMPLANT
GLIDESHEATH SLENDER 7FR .021G (SHEATH) ×2 IMPLANT
GUIDEWIRE INQWIRE 1.5J.035X260 (WIRE) ×1 IMPLANT
INQWIRE 1.5J .035X260CM (WIRE) ×2
KIT HEART LEFT (KITS) ×2 IMPLANT
PACK CARDIAC CATHETERIZATION (CUSTOM PROCEDURE TRAY) ×2 IMPLANT
TRANSDUCER W/STOPCOCK (MISCELLANEOUS) ×2 IMPLANT
TUBING CIL FLEX 10 FLL-RA (TUBING) ×2 IMPLANT

## 2020-07-08 NOTE — Discharge Instructions (Signed)
Radial Site Care  This sheet gives you information about how to care for yourself after your procedure. Your health care provider may also give you more specific instructions. If you have problems or questions, contact your health care provider. What can I expect after the procedure? After the procedure, it is common to have:  Bruising and tenderness at the catheter insertion area. Follow these instructions at home: Medicines  Take over-the-counter and prescription medicines only as told by your health care provider. Insertion site care  Follow instructions from your health care provider about how to take care of your insertion site. Make sure you: ? Wash your hands with soap and water before you change your bandage (dressing). If soap and water are not available, use hand sanitizer. ? Change your dressing as told by your health care provider. ? Leave stitches (sutures), skin glue, or adhesive strips in place. These skin closures may need to stay in place for 2 weeks or longer. If adhesive strip edges start to loosen and curl up, you may trim the loose edges. Do not remove adhesive strips completely unless your health care provider tells you to do that.  Check your insertion site every day for signs of infection. Check for: ? Redness, swelling, or pain. ? Fluid or blood. ? Pus or a bad smell. ? Warmth.  Do not take baths, swim, or use a hot tub until your health care provider approves.  You may shower 24-48 hours after the procedure, or as directed by your health care provider. ? Remove the dressing and gently wash the site with plain soap and water. ? Pat the area dry with a clean towel. ? Do not rub the site. That could cause bleeding.  Do not apply powder or lotion to the site. Activity   For 24 hours after the procedure, or as directed by your health care provider: ? Do not flex or bend the affected arm. ? Do not push or pull heavy objects with the affected arm. ? Do not  drive yourself home from the hospital or clinic. You may drive 24 hours after the procedure unless your health care provider tells you not to. ? Do not operate machinery or power tools.  Do not lift anything that is heavier than 10 lb (4.5 kg), or the limit that you are told, until your health care provider says that it is safe.  Ask your health care provider when it is okay to: ? Return to work or school. ? Resume usual physical activities or sports. ? Resume sexual activity. General instructions  If the catheter site starts to bleed, raise your arm and put firm pressure on the site. If the bleeding does not stop, get help right away. This is a medical emergency.  If you went home on the same day as your procedure, a responsible adult should be with you for the first 24 hours after you arrive home.  Keep all follow-up visits as told by your health care provider. This is important. Contact a health care provider if:  You have a fever.  You have redness, swelling, or yellow drainage around your insertion site. Get help right away if:  You have unusual pain at the radial site.  The catheter insertion area swells very fast.  The insertion area is bleeding, and the bleeding does not stop when you hold steady pressure on the area.  Your arm or hand becomes pale, cool, tingly, or numb. These symptoms may represent a serious problem   that is an emergency. Do not wait to see if the symptoms will go away. Get medical help right away. Call your local emergency services (911 in the [place] Do not drive yourself to the hospital. Summary  After the procedure, it is common to have bruising and tenderness at the site.  Follow instructions from your health care provider about how to take care of your radial site wound. Check the wound every day for signs of infection.  Do not lift anything that is heavier than 10 lb (4.5 kg), or the limit that you are told, until your health care provider says  that it is safe. This information is not intended to replace advice given to you by your health care provider. Make sure you discuss any questions you have with your health care provider. Document Revised: 01/01/2018 Document Reviewed: 01/01/2018 Elsevier Patient Education  2020 Elsevier Inc.  

## 2020-07-08 NOTE — Interval H&P Note (Signed)
History and Physical Interval Note:  07/08/2020 7:40 AM  Maurice Wood  has presented today for surgery, with the diagnosis of heart failure.  The various methods of treatment have been discussed with the patient and family. After consideration of risks, benefits and other options for treatment, the patient has consented to  Procedure(s): RIGHT/LEFT HEART CATH AND CORONARY ANGIOGRAPHY (N/A) and possible coronary angioplasty as a surgical intervention.  The patient's history has been reviewed, patient examined, no change in status, stable for surgery.  I have reviewed the patient's chart and labs.  Questions were answered to the patient's satisfaction.     Loa Idler

## 2020-07-08 NOTE — Progress Notes (Signed)
Maurice Wood spoke with Dr Haroldine Laws and per Dr Haroldine Laws client to resume eliquis this evening, client and his wife notified and voiced understanding

## 2020-07-19 ENCOUNTER — Other Ambulatory Visit: Payer: Self-pay

## 2020-07-19 ENCOUNTER — Encounter: Payer: Self-pay | Admitting: Podiatry

## 2020-07-19 ENCOUNTER — Ambulatory Visit (INDEPENDENT_AMBULATORY_CARE_PROVIDER_SITE_OTHER): Payer: PPO | Admitting: Podiatry

## 2020-07-19 DIAGNOSIS — E1142 Type 2 diabetes mellitus with diabetic polyneuropathy: Secondary | ICD-10-CM

## 2020-07-19 DIAGNOSIS — M79675 Pain in left toe(s): Secondary | ICD-10-CM

## 2020-07-19 DIAGNOSIS — D689 Coagulation defect, unspecified: Secondary | ICD-10-CM

## 2020-07-19 DIAGNOSIS — M79674 Pain in right toe(s): Secondary | ICD-10-CM

## 2020-07-19 DIAGNOSIS — B351 Tinea unguium: Secondary | ICD-10-CM

## 2020-07-19 NOTE — Progress Notes (Signed)
This patient returns to my office for at risk foot care.  This patient requires this care by a professional since this patient will be at risk due to having diabetic neuropathy, and coagulation disorder.  Patient is taking eliquiss.  This patient is unable to cut nails himself since the patient cannot reach his nails.These nails are painful walking and wearing shoes.  This patient presents for at risk foot care today.  General Appearance  Alert, conversant and in no acute stress.  Vascular  Dorsalis pedis and posterior tibial  pulses are  not palpable  bilaterally.  Capillary return is within normal limits  bilaterally. Temperature is within normal limits  bilaterally.  Neurologic  Senn-Weinstein monofilament wire test absent   bilaterally. Muscle power within normal limits bilaterally.  Nails Thick disfigured discolored nails with subungual debris  hallux nails  bilaterally. No evidence of bacterial infection or drainage bilaterally.  Orthopedic  No limitations of motion  feet .  No crepitus or effusions noted.  No bony pathology or digital deformities noted.  Skin  normotropic skin with no porokeratosis noted bilaterally.  No signs of infections or ulcers noted.     Onychomycosis  Pain in right toes  Pain in left toes  Consent was obtained for treatment procedures.   Mechanical debridement of nails 1-5  bilaterally performed with a nail nipper.  Filed with dremel without incident.    Return office visit    3 months                  Told patient to return for periodic foot care and evaluation due to potential at risk complications.   Lillyian Heidt DPM  

## 2020-07-21 ENCOUNTER — Encounter (HOSPITAL_COMMUNITY): Payer: PPO | Admitting: Internal Medicine

## 2020-07-21 DIAGNOSIS — Z0000 Encounter for general adult medical examination without abnormal findings: Secondary | ICD-10-CM | POA: Diagnosis not present

## 2020-07-21 DIAGNOSIS — Z1389 Encounter for screening for other disorder: Secondary | ICD-10-CM | POA: Diagnosis not present

## 2020-07-21 DIAGNOSIS — J849 Interstitial pulmonary disease, unspecified: Secondary | ICD-10-CM | POA: Diagnosis not present

## 2020-07-21 DIAGNOSIS — K219 Gastro-esophageal reflux disease without esophagitis: Secondary | ICD-10-CM | POA: Diagnosis not present

## 2020-07-21 DIAGNOSIS — R7303 Prediabetes: Secondary | ICD-10-CM | POA: Diagnosis not present

## 2020-07-21 DIAGNOSIS — I482 Chronic atrial fibrillation, unspecified: Secondary | ICD-10-CM | POA: Diagnosis not present

## 2020-07-21 DIAGNOSIS — N3281 Overactive bladder: Secondary | ICD-10-CM | POA: Diagnosis not present

## 2020-07-21 DIAGNOSIS — D6869 Other thrombophilia: Secondary | ICD-10-CM | POA: Diagnosis not present

## 2020-07-21 DIAGNOSIS — G6289 Other specified polyneuropathies: Secondary | ICD-10-CM | POA: Diagnosis not present

## 2020-07-21 DIAGNOSIS — J302 Other seasonal allergic rhinitis: Secondary | ICD-10-CM | POA: Diagnosis not present

## 2020-07-21 DIAGNOSIS — R609 Edema, unspecified: Secondary | ICD-10-CM | POA: Diagnosis not present

## 2020-07-21 DIAGNOSIS — E538 Deficiency of other specified B group vitamins: Secondary | ICD-10-CM | POA: Diagnosis not present

## 2020-08-01 DIAGNOSIS — Z03818 Encounter for observation for suspected exposure to other biological agents ruled out: Secondary | ICD-10-CM | POA: Diagnosis not present

## 2020-08-01 DIAGNOSIS — R5381 Other malaise: Secondary | ICD-10-CM | POA: Diagnosis not present

## 2020-08-01 DIAGNOSIS — R05 Cough: Secondary | ICD-10-CM | POA: Diagnosis not present

## 2020-08-08 ENCOUNTER — Encounter: Payer: Self-pay | Admitting: Primary Care

## 2020-08-08 ENCOUNTER — Ambulatory Visit: Payer: PPO | Admitting: Primary Care

## 2020-08-08 ENCOUNTER — Other Ambulatory Visit: Payer: Self-pay

## 2020-08-08 ENCOUNTER — Ambulatory Visit (INDEPENDENT_AMBULATORY_CARE_PROVIDER_SITE_OTHER): Payer: PPO | Admitting: Pulmonary Disease

## 2020-08-08 VITALS — BP 112/64 | HR 90 | Temp 98.0°F | Ht 70.0 in | Wt 189.0 lb

## 2020-08-08 DIAGNOSIS — J849 Interstitial pulmonary disease, unspecified: Secondary | ICD-10-CM

## 2020-08-08 DIAGNOSIS — J449 Chronic obstructive pulmonary disease, unspecified: Secondary | ICD-10-CM | POA: Insufficient documentation

## 2020-08-08 LAB — PULMONARY FUNCTION TEST
DL/VA % pred: 70 %
DL/VA: 2.7 ml/min/mmHg/L
DLCO cor % pred: 50 %
DLCO cor: 11.95 ml/min/mmHg
DLCO unc % pred: 50 %
DLCO unc: 12.05 ml/min/mmHg
FEF 25-75 Post: 1.09 L/sec
FEF 25-75 Pre: 0.54 L/sec
FEF2575-%Change-Post: 100 %
FEF2575-%Pred-Post: 61 %
FEF2575-%Pred-Pre: 30 %
FEV1-%Change-Post: 19 %
FEV1-%Pred-Post: 61 %
FEV1-%Pred-Pre: 51 %
FEV1-Post: 1.67 L
FEV1-Pre: 1.4 L
FEV1FVC-%Change-Post: 9 %
FEV1FVC-%Pred-Pre: 71 %
FEV6-%Change-Post: 7 %
FEV6-%Pred-Post: 82 %
FEV6-%Pred-Pre: 76 %
FEV6-Post: 2.94 L
FEV6-Pre: 2.73 L
FEV6FVC-%Change-Post: 1 %
FEV6FVC-%Pred-Post: 107 %
FEV6FVC-%Pred-Pre: 106 %
FVC-%Change-Post: 9 %
FVC-%Pred-Post: 78 %
FVC-%Pred-Pre: 71 %
FVC-Post: 3.02 L
FVC-Pre: 2.76 L
Post FEV1/FVC ratio: 55 %
Post FEV6/FVC ratio: 100 %
Pre FEV1/FVC ratio: 51 %
Pre FEV6/FVC Ratio: 99 %
RV % pred: 79 %
RV: 2.17 L
TLC % pred: 67 %
TLC: 4.74 L

## 2020-08-08 MED ORDER — SPIRIVA RESPIMAT 2.5 MCG/ACT IN AERS
2.0000 | INHALATION_SPRAY | Freq: Every day | RESPIRATORY_TRACT | 0 refills | Status: DC
Start: 2020-08-08 — End: 2020-08-22

## 2020-08-08 NOTE — Assessment & Plan Note (Addendum)
-   Patient reports dyspnea since June 20221. CT chest showed component emphysema, ILD and 9mm pulmonary nodule. Serology negative. Pulmonary functoin testing showed moderate COPD with reversibility. FEV1 1.67 (61%), ratio 55, DLCOcor 11.95 (50%). We will check lab test called alpha-1 antitrypsin phenotype today. Start therapeutic trail Spiriva Respimat 2.61mcg 2 puffs once daily in the morning  (2 week sample given). Plan repeat PFTs in 6 months to monitor diffusion capacity and change CT chest to HRCT in July 2022 or sooner if symptoms change   Orders - PFTs in 6 months (ordered) - Change CT chest to HRCT in July 2022 re: ILD/pulmonary nodule  Follow-up: - 2 week follow-up televisit with [facility]

## 2020-08-08 NOTE — Patient Instructions (Addendum)
Pleasure meeting you today  Pulmonary functoin testing showed moderate COPD with reversibility. We will check lab test called alpha-1 antitrypsin phenotype today as there can be a genetic component to COPD. Start therapeutic trail Spiriva Respimat 2 puffs once daily in the morning    Recommendations: - Start Spiriva respimat 2 puffs once daily in the morning (2 week sample given)  Orders - PFTs in 6 months (ordered) - Change CT chest to HRCT in July 2022 re: ILD/pulmonary nodule  Follow-up: - 2 week follow-up televisit with [facility]

## 2020-08-08 NOTE — Progress Notes (Signed)
@Patient  ID: Maurice Wood, male    DOB: 1933/05/18, 86 y.o.   MRN: [id number]  Chief Complaint  Patient presents with  . Follow-up    Referring provider: Antony Contras, MD  HPI: 86 year old male, former smoker quit 1973 (20-pack-year history). Past medical history significant for obstructive sleep apnea, ILD, Chorioretinal inflammation right eye (on Imuran), A. fib, carotid artery dissection, diverticulosis, GERD, IBS, type 2 diabetes, testosterone deficiency, coagulation disorder. Patient of Dr. Halford Chessman, last seen in office on 06/27/2020. He was seen in the emergency room in June for dyspnea. Chest x-ray showed possible perihilar density. He saw Dr. Ronna Polio. CT chest done which showed emphysema, ILD and lung nodule. He is currently off prednisone. Remains on Imuran. Maintained on CPAP at 12cm h20 nightly. Ordered for PFTs and serology to further assess ILD. Needs follow-up CT chest wo contrast in July 2022.  08/08/2020 Patient presents today for a 4 to 6-week follow-up with PFTs. Serology for ILD came back negative.  He is doing well, reports getting winded with activities such as dressing. He is a former smoker quit in 1970s. His father had COPD. Denies fever, chills, cough, chest tightness or wheezing.   Pulmonary function testing: FVC 3.02 (78%), FEV1 1.67 (61%), ratio 55, DLCOcor 11.95 (50%), +BD Interpretation: Moderate obstructive lung disease with reversibility and severe diffusion defect   Allergies  Allergen Reactions  . Epinephrine Palpitations    Increased Heart Rate  . Nsaids Other (See Comments)    NOT WHILE TAKING ELIQUIS   . Clarithromycin Other (See Comments)    headache  . Iodinated Diagnostic Agents Other (See Comments)    Pt states "turning bright red"  . Nimodipine     Flushing    Immunization History  Administered Date(s) Administered  . H1N1 11/19/2008  . Influenza Split 11/30/2012  . Influenza Whole 09/09/2006, 09/11/2007, 09/14/2008, 09/06/2009,  08/13/2011, 08/10/2012  . Influenza, High Dose Seasonal PF 09/15/2018, 07/31/2019  . Influenza,inj,Quad PF,6+ Mos 08/13/2013, 08/30/2014  . Influenza-Unspecified 09/09/2017  . PFIZER SARS-COV-2 Vaccination 12/30/2019, 01/20/2020  . Pneumococcal Polysaccharide-23 12/11/2003, 11/02/2009  . Td 09/05/1999, 07/31/2010  . Tdap 09/11/2016, 10/22/2018  . Zoster 09/09/2006  . Zoster Recombinat (Shingrix) 06/27/2018, 10/03/2018    Past Medical History:  Diagnosis Date  . Allergy    rhinitis  . Atrial fibrillation [facility]) Jan 2007   echo 1-07 normal ejection fraction, no significant valvular disease. adenosine cardiolite 1-07  no evidence of ischemia. A 48 hour Holter monitor in 7-07 showed good rate controlw/ chronic atrial fibrillation. Cath 1/11 normal cors. EF 45%. Echo 2-11 60-65%  . Benign prostatic hypertrophy   . Cerebral aneurysm    followed by Dr Estanislado Pandy  . Fatigue   . History of chest pain    a. s/p LHC 05/2014 with normal cors  . Hx of colonic polyps    diverticulosis  . Hyperlipidemia   . IBS (irritable bowel syndrome)   . Incontinence    Per pt 12/14/11  . Kidney stones   . Obesity   . Obstructive sleep apnea    noncompliant with CPAP  . Retinal vein occlusion     Tobacco History: Social History   Tobacco Use  Smoking Status Former Smoker  . Packs/day: 1.00  . Years: 20.00  . Pack years: 20.00  . Types: Cigarettes  . Quit date: 12/11/1971  . Years since quitting: 48.6  Smokeless Tobacco Never Used   Counseling given: Not Answered   Outpatient Medications Prior to Visit  Medication  Sig Dispense Refill  . acetaminophen (TYLENOL) 500 MG tablet Take 500-1,000 mg by mouth every 6 (six) hours as needed for moderate pain.    Marland Kitchen apixaban (ELIQUIS) 5 MG TABS tablet Take 5 mg by mouth 2 (two) times daily.    Marland Kitchen azaTHIOprine (IMURAN) 50 MG tablet Take 25 mg by mouth daily.     . Biotin 1000 MCG CHEW Chew 1,000 mcg by mouth daily.    . cetirizine (ZYRTEC) 10 MG tablet Take  10 mg by mouth daily.    . chlorpheniramine (CHLOR-TRIMETON) 4 MG tablet Take 4 mg by mouth daily as needed for allergies.     . diphenhydrAMINE (BENADRYL) 25 MG tablet Take 50 mg by mouth once. Pre heart cath    . famotidine (PEPCID) 20 MG tablet Take 20 mg by mouth daily as needed for heartburn or indigestion.    . finasteride (PROSCAR) 5 MG tablet Take 5 mg by mouth daily.    . fluticasone (FLONASE) 50 MCG/ACT nasal spray Place 1 spray into both nostrils daily.    . furosemide (LASIX) 40 MG tablet Take 1 tablet (40 mg total) by mouth daily. 30 tablet 3  . omeprazole (PRILOSEC) 20 MG capsule Take 20 mg by mouth daily.     . potassium chloride SA (KLOR-CON) 20 MEQ tablet Take 1 tablet (20 mEq total) by mouth daily. 30 tablet 3  . vitamin B-12 (CYANOCOBALAMIN) 1000 MCG tablet Take 1,000 mcg by mouth every other day.     . predniSONE (DELTASONE) 50 MG tablet One tablet 13 hours prior to procedure, one tablet 7 hours prior to procedure and last tablet on the way to procedure with 25 mg of benadryl 3 tablet 0   No facility-administered medications prior to visit.   Review of Systems  Review of Systems  Constitutional: Negative.  Negative for fatigue, fever and unexpected weight change.  Respiratory: Positive for shortness of breath. Negative for cough, chest tightness and wheezing.    Physical Exam  BP 112/64 (BP Location: Right Arm, Cuff Size: Normal)   Pulse 90   Temp 98 F (36.7 C)   Ht 5\' 10"  (1.778 m)   Wt 189 lb (85.7 kg)   SpO2 96%   BMI 27.12 kg/m  Physical Exam Constitutional:      Appearance: Normal appearance. He is not ill-appearing.  HENT:     Head: Normocephalic and atraumatic.     Mouth/Throat:     Mouth: Mucous membranes are moist.     Pharynx: Oropharynx is clear.  Cardiovascular:     Rate and Rhythm: Normal rate and regular rhythm.  Pulmonary:     Effort: Pulmonary effort is normal.     Breath sounds: Normal breath sounds. No wheezing or rhonchi.      Comments: Fine rales bilateral bases Skin:    General: Skin is warm and dry.  Neurological:     General: No focal deficit present.     Mental Status: He is alert and oriented to person, place, and time. Mental status is at baseline.  Psychiatric:        Mood and Affect: Mood normal.        Behavior: Behavior normal.        Thought Content: Thought content normal.        Judgment: Judgment normal.      Lab Results:  CBC    Component Value Date/Time   WBC 3.7 (L) 07/05/2020 0728   RBC 5.08 07/05/2020 0728  HGB 13.9 07/08/2020 0757   HCT 41.0 07/08/2020 0757   PLT 189 07/05/2020 0728   MCV 92.1 07/05/2020 0728   MCV 85.5 11/11/2015 1029   MCH 29.3 07/05/2020 0728   MCHC 31.8 07/05/2020 0728   RDW 13.8 07/05/2020 0728   LYMPHSABS 1.1 12/02/2019 1049   MONOABS 0.4 12/02/2019 1049   EOSABS 0.1 12/02/2019 1049   BASOSABS 0.1 12/02/2019 1049    BMET    Component Value Date/Time   NA 143 07/08/2020 0757   K 4.2 07/08/2020 0757   CL 103 07/05/2020 0728   CO2 22 07/05/2020 0728   GLUCOSE 204 (H) 07/05/2020 0728   BUN 17 07/05/2020 0728   CREATININE 1.25 (H) 07/05/2020 0728   CALCIUM 9.6 07/05/2020 0728   GFRNONAA 53 (L) 07/05/2020 0728   GFRAA >60 07/05/2020 0728    BNP    Component Value Date/Time   BNP 149.7 (H) 06/09/2020 1353    ProBNP    Component Value Date/Time   PROBNP 142.0 (H) 12/02/2019 1049    Imaging: No results found.   Assessment & Plan:   COPD, moderate ([place]) - Patient reports dyspnea since June 20221. CT chest showed component emphysema, ILD and 21mm pulmonary nodule. Serology negative. Pulmonary functoin testing showed moderate COPD with reversibility. FEV1 1.67 (61%), ratio 55, DLCOcor 11.95 (50%). We will check lab test called alpha-1 antitrypsin phenotype today. Start therapeutic trail Spiriva Respimat 2.59mcg 2 puffs once daily in the morning  (2 week sample given). Plan repeat PFTs in 6 months to monitor diffusion capacity and change CT  chest to HRCT in July 2022 or sooner if symptoms change   Orders - PFTs in 6 months (ordered) - Change CT chest to HRCT in July 2022 re: ILD/pulmonary nodule  Follow-up: - 2 week follow-up televisit with Maryclare Bean, NP 08/08/2020

## 2020-08-08 NOTE — Progress Notes (Signed)
Full PFT performed today. °

## 2020-08-08 NOTE — Progress Notes (Signed)
Reviewed and agree with assessment/plan.   Chesley Mires, MD [facility] Pulmonary/Critical Care 08/08/2020, 2:48 PM Pager:  [phone]

## 2020-08-09 DIAGNOSIS — H34832 Tributary (branch) retinal vein occlusion, left eye, with macular edema: Secondary | ICD-10-CM | POA: Diagnosis not present

## 2020-08-09 DIAGNOSIS — H43813 Vitreous degeneration, bilateral: Secondary | ICD-10-CM | POA: Diagnosis not present

## 2020-08-09 DIAGNOSIS — Z961 Presence of intraocular lens: Secondary | ICD-10-CM | POA: Diagnosis not present

## 2020-08-09 DIAGNOSIS — H35372 Puckering of macula, left eye: Secondary | ICD-10-CM | POA: Diagnosis not present

## 2020-08-09 DIAGNOSIS — H35352 Cystoid macular degeneration, left eye: Secondary | ICD-10-CM | POA: Diagnosis not present

## 2020-08-09 DIAGNOSIS — Z79899 Other long term (current) drug therapy: Secondary | ICD-10-CM | POA: Diagnosis not present

## 2020-08-09 DIAGNOSIS — H30031 Focal chorioretinal inflammation, peripheral, right eye: Secondary | ICD-10-CM | POA: Diagnosis not present

## 2020-08-11 DIAGNOSIS — L570 Actinic keratosis: Secondary | ICD-10-CM | POA: Diagnosis not present

## 2020-08-11 DIAGNOSIS — Z85828 Personal history of other malignant neoplasm of skin: Secondary | ICD-10-CM | POA: Diagnosis not present

## 2020-08-18 LAB — ALPHA-1 ANTITRYPSIN PHENOTYPE: A-1 Antitrypsin, Ser: 209 mg/dL — ABNORMAL HIGH (ref 83–199)

## 2020-08-22 ENCOUNTER — Other Ambulatory Visit: Payer: Self-pay

## 2020-08-22 ENCOUNTER — Ambulatory Visit (INDEPENDENT_AMBULATORY_CARE_PROVIDER_SITE_OTHER): Payer: PPO | Admitting: Primary Care

## 2020-08-22 ENCOUNTER — Encounter: Payer: Self-pay | Admitting: Primary Care

## 2020-08-22 DIAGNOSIS — J449 Chronic obstructive pulmonary disease, unspecified: Secondary | ICD-10-CM

## 2020-08-22 DIAGNOSIS — J849 Interstitial pulmonary disease, unspecified: Secondary | ICD-10-CM

## 2020-08-22 MED ORDER — STIOLTO RESPIMAT 2.5-2.5 MCG/ACT IN AERS
2.0000 | INHALATION_SPRAY | Freq: Every day | RESPIRATORY_TRACT | 0 refills | Status: DC
Start: 2020-08-22 — End: 2020-09-06

## 2020-08-22 NOTE — Progress Notes (Signed)
Virtual Visit via Telephone Note  I connected with Maurice Wood on 08/22/20 at 11:30 AM EDT by telephone and verified that I am speaking with the correct person using two identifiers.  Location: Patient: Home Provider:Office   I discussed the limitations, risks, security and privacy concerns of performing an evaluation and management service by telephone and the availability of in person appointments. I also discussed with the patient that there may be a patient responsible charge related to this service. The patient expressed understanding and agreed to proceed.   History of Present Illness: HPI: 85 year old male, former smoker quit 1973 (20-pack-year history). Past medical history significant for obstructive sleep apnea, ILD, Chorioretinal inflammation right eye (on Imuran), A. fib, carotid artery dissection, diverticulosis, GERD, IBS, type 2 diabetes, testosterone deficiency, coagulation disorder. Patient of Dr. Halford Chessman, last seen in office on 06/27/2020. He was seen in the emergency room in June for dyspnea. Chest x-ray showed possible perihilar density. He saw Dr. Haroldine Laws. CT chest done which showed emphysema, ILD and lung nodule. He is currently off prednisone. Remains on Imuran. Maintained on CPAP at 12cm h20 nightly. Ordered for PFTs and serology to further assess ILD. Needs follow-up CT chest wo contrast in July 2022.  Previous [facility] pulmonary encounter: 06/27/20- Dr. Halford Chessman He was seen in ER for dyspnea in June.  CXR showed possible perihilar density.  Saw Dr. Haroldine Laws.  Had CT chest done.  Showed emphysema, ILD, and lung nodule.  He also had Echo done. He is off prednisone.  Remains on imuran.  Has ophthalmology appointment next month. Denies problems with swallowing.  No joint swelling.  No prior history of ILD.  Quit smoking in 1970's.  Used to get frequent episodes of pneumonia in 1970's. Uses CPAP nightly.  Got a new mask and fits better. Labs from 05/26/20: Na 142, K 3.8, CO2 23,  Creatinine 0.89, Hb 14.1, WBC 6.7, PLT 173  08/08/2020- NP Patient presents today for a 4 to 6-week follow-up with PFTs. Serology for ILD came back negative.  He is doing well, reports getting winded with activities such as dressing. He is a former smoker quit in 1970s. His father had COPD. Denies fever, chills, cough, chest tightness or wheezing.    08/22/2020- Interim hx Patient contacted today for 2 week follow-up televisit COPD/emphysema. Feels Spiriva sample has improved his breathing a little bit but he still gets winded with activity with intermittent wheezing. It takes him a few minutes to recover. States that it doesn't happen every time but enough that it bothers him. Denies cough.    Airview download 07/23/20-08/21/20: Usage 27/30 days used (90%); 24 days (80%) > 4 hours Average usage days used 8 hours 5 mins Pressure 12cm h20 Airleaks 6.6 L/min AHI 1.5  Observations/Objective:  - Able to speak in full sentences   Pulmonary function testing: FVC 3.02 (78%), FEV1 1.67 (61%), ratio 55, DLCOcor 11.95 (50%), +BD Interpretation: Moderate obstructive lung disease with reversibility and severe diffusion defect    Imaging: 4 mm right middle lobe pulmonary nodule, nonspecific, but statistically likely benign. No follow-up needed if patient is low-risk. Non-contrast chest CT can be considered in 12 months if patient is high-risk   Assessment and Plan:   COPD, moderate - Alpha 1 MM phenoptype, 209  - CT showed changes of emphysema and ILD. Arranged for pulmonary function testing. Serology negative. Pulmonary function testing showed moderate COPD with reversibility. He saw partial improvement in breathing with additional of LAMA, continues to have some mild-moderate exertional  dyspnea but recovered quickly with rest. Plan escalate therapy to LAMA/LABA.   ILD: - Serology negative  Diffusion capacity decreased, however, this could be from emphysema. - Recommend repeating PFTs in 6  months to monitor diffusion capacity and due for HRCT in July 2022 to ensure stability   Follow Up Instructions:   - 2 week televisit to assess response to STIOLTO   I discussed the assessment and treatment plan with the patient. The patient was provided an opportunity to ask questions and all were answered. The patient agreed with the plan and demonstrated an understanding of the instructions.   The patient was advised to call back or seek an in-person evaluation if the symptoms worsen or if the condition fails to improve as anticipated.  I provided 26 minutes of non-face-to-face time during this encounter.   Martyn Ehrich, NP

## 2020-08-22 NOTE — Patient Instructions (Addendum)
COPD: Stop Spiriva Respimat Start Stiolto Respimat- take two puffs once daily in the morning   Follow-up:  Televisit Sept 28th at 2:30 pm- please schedule

## 2020-08-23 NOTE — Progress Notes (Signed)
Reviewed and agree with assessment/plan.   Chesley Mires, MD [facility] Pulmonary/Critical Care 08/23/2020, 8:34 AM Pager:  [phone]

## 2020-08-25 DIAGNOSIS — R519 Headache, unspecified: Secondary | ICD-10-CM | POA: Diagnosis not present

## 2020-08-25 DIAGNOSIS — G4700 Insomnia, unspecified: Secondary | ICD-10-CM | POA: Diagnosis not present

## 2020-08-25 DIAGNOSIS — R195 Other fecal abnormalities: Secondary | ICD-10-CM | POA: Diagnosis not present

## 2020-08-25 DIAGNOSIS — K219 Gastro-esophageal reflux disease without esophagitis: Secondary | ICD-10-CM | POA: Diagnosis not present

## 2020-08-25 DIAGNOSIS — R110 Nausea: Secondary | ICD-10-CM | POA: Diagnosis not present

## 2020-09-06 ENCOUNTER — Telehealth: Payer: Self-pay | Admitting: Primary Care

## 2020-09-06 ENCOUNTER — Other Ambulatory Visit: Payer: Self-pay

## 2020-09-06 ENCOUNTER — Ambulatory Visit (INDEPENDENT_AMBULATORY_CARE_PROVIDER_SITE_OTHER): Payer: PPO | Admitting: Primary Care

## 2020-09-06 ENCOUNTER — Encounter: Payer: Self-pay | Admitting: Primary Care

## 2020-09-06 DIAGNOSIS — J449 Chronic obstructive pulmonary disease, unspecified: Secondary | ICD-10-CM

## 2020-09-06 MED ORDER — STIOLTO RESPIMAT 2.5-2.5 MCG/ACT IN AERS: 2.0000 | INHALATION_SPRAY | Freq: Every day | RESPIRATORY_TRACT | 5 refills | Status: DC

## 2020-09-06 MED ORDER — ALBUTEROL SULFATE HFA 108 (90 BASE) MCG/ACT IN AERS
2.0000 | INHALATION_SPRAY | Freq: Four times a day (QID) | RESPIRATORY_TRACT | 6 refills | Status: DC | PRN
Start: 2020-09-06 — End: 2022-07-31

## 2020-09-06 NOTE — Telephone Encounter (Signed)
Patient came into the office regarding  Samples of Stiolto. Offered patient 2 samples . Patient's voice was understanding.Nothing else further needed.

## 2020-09-06 NOTE — Progress Notes (Signed)
Virtual Visit via Telephone Note  I connected with Maurice Wood on 09/06/20 at  2:30 PM EDT by telephone and verified that I am speaking with the correct person using two identifiers.  Location: Patient: Home Provider: Office   I discussed the limitations, risks, security and privacy concerns of performing an evaluation and management service by telephone and the availability of in person appointments. I also discussed with the patient that there may be a patient responsible charge related to this service. The patient expressed understanding and agreed to proceed.   History of Present Illness: 86 year old male, former smoker quit 1973 (20-pack-year history). Past medical history significant for obstructive sleep apnea, ILD, Chorioretinal inflammation right eye (on Imuran), A. fib, carotid artery dissection, diverticulosis, GERD, IBS, type 2 diabetes, testosterone deficiency, coagulation disorder. Patient of Dr. Halford Chessman, last seen in office on 06/27/2020. He was seen in the emergency room in June for dyspnea. Chest x-ray showed possible perihilar density. He saw Dr. Haroldine Laws. CT chest done which showed emphysema, ILD and lung nodule. He is currently off prednisone. Remains on Imuran. Maintained on CPAP at 12cm h20 nightly. Ordered for PFTs and serology to further assess ILD. Needs follow-up CT chest wo contrast in July 2022.  Previous [facility] pulmonary encounter: 06/27/20- Dr. Halford Chessman He was seen in ER for dyspnea in June.  CXR showed possible perihilar density.  Saw Dr. Haroldine Laws.  Had CT chest done.  Showed emphysema, ILD, and lung nodule.  He also had Echo done. He is off prednisone.  Remains on imuran.  Has ophthalmology appointment next month. Denies problems with swallowing.  No joint swelling.  No prior history of ILD.  Quit smoking in 1970's.  Used to get frequent episodes of pneumonia in 1970's. Uses CPAP nightly.  Got a new mask and fits better. Labs from 05/26/20: Na 142, K 3.8, CO2 23, Creatinine  0.89, Hb 14.1, WBC 6.7, PLT 173  08/08/2020 Patient presents today for a 4 to 6-week follow-up with PFTs. Serology for ILD came back negative.  He is doing well, reports getting winded with activities such as dressing. He is a former smoker quit in 1970s. His father had COPD. Denies fever, chills, cough, chest tightness or wheezing.   08/22/2020 Patient contacted today for 2 week follow-up televisit COPD/emphysema. Feels Spiriva sample has improved his breathing a little bit but he still gets winded with activity with intermittent wheezing. It takes him a few minutes to recover. States that it doesn't happen every time but enough that it bothers him. Denies cough.    Airview download 07/23/20-08/21/20: Usage 27/30 days used (90%); 24 days (80%) > 4 hours Average usage days used 8 hours 5 mins Pressure 12cm h20 Airleaks 6.6 L/min AHI 1.5  09/06/2020- Interim hx Patient contacted today for televisit 2 week follow-up new inhaler. Wife on phone call. During last visit we changed Spiriva to [employer]. He feels his breathing has improved a fair amount with new inhaler. He did have an episode of shortness of breath this morning after his regular Tuesday meetings and he thinks its because Stiolto sample was in the red. He would like to continue medication as it has seemed to help. He does not have rescue inhaler. No acute complaints. He has an apt for covid booster in 3 weeks.    Observations/Objective:  - Able to speak in full sentences; no overt wheezing or shortness of breath  Pulmonary function testing:  FVC 3.02 (78%), FEV1 1.67 (61%), ratio 55, DLCOcor 11.95 (50%), +BD  Interpretation: Moderate obstructive lung disease with reversibility and moderate-severe diffusion defect     Assessment and Plan:  Moderate COPD: - Improved with escalation of therapy to LABA/LAMA - Continue Stiolto Respimat 2 puffs once daily  - CT imaging in July showed mild centrilobular and paraseptal emphysema  ILD: -  CT chest in July 2021 showed changes of emphysema and ILD.  - Serology negative. Moderate-severe diffusion defect on PFTs.  - Recommend repeat PFTs in 5-6 months from previous (January 2022) to monitor for changes in diffusion capacity. If decrease or clinically symptoms worsen recommend getting HRCT sooner  - Due for HRCT in July 2022 to ensure stability   Follow Up Instructions:  3-4 months with PFTs/ Dr. Halford Chessman   I discussed the assessment and treatment plan with the patient. The patient was provided an opportunity to ask questions and all were answered. The patient agreed with the plan and demonstrated an understanding of the instructions.   The patient was advised to call back or seek an in-person evaluation if the symptoms worsen or if the condition fails to improve as anticipated.  I provided 25 minutes of non-face-to-face time during this encounter.   Martyn Ehrich, NP

## 2020-09-06 NOTE — Patient Instructions (Signed)
Continue Stiolto 2 puffs once daily Follow-up 3-4 months with PFTs

## 2020-09-07 DIAGNOSIS — K219 Gastro-esophageal reflux disease without esophagitis: Secondary | ICD-10-CM | POA: Diagnosis not present

## 2020-09-07 DIAGNOSIS — R110 Nausea: Secondary | ICD-10-CM | POA: Diagnosis not present

## 2020-09-07 DIAGNOSIS — K529 Noninfective gastroenteritis and colitis, unspecified: Secondary | ICD-10-CM | POA: Diagnosis not present

## 2020-09-07 DIAGNOSIS — K580 Irritable bowel syndrome with diarrhea: Secondary | ICD-10-CM | POA: Diagnosis not present

## 2020-09-07 NOTE — Progress Notes (Signed)
Reviewed and agree with assessment/plan.   Calle Schader, MD [place] Pulmonary/Critical Care 09/07/2020, 8:38 AM Pager:  [phone]  

## 2020-09-13 ENCOUNTER — Telehealth: Payer: Self-pay | Admitting: Pulmonary Disease

## 2020-09-13 NOTE — Telephone Encounter (Signed)
Spoke with the pt's spouse  She spoke with the pt's insurance and found out that the Imbery is not covered by plan but Adviar diskus is  She is asking if this could be called in instead  Please advise, thanks!

## 2020-09-14 MED ORDER — FLUTICASONE-SALMETEROL 250-50 MCG/DOSE IN AEPB
1.0000 | INHALATION_SPRAY | Freq: Two times a day (BID) | RESPIRATORY_TRACT | 5 refills | Status: DC
Start: 2020-09-14 — End: 2021-03-25

## 2020-09-14 NOTE — Telephone Encounter (Signed)
ATC patient.  Left VM letting patient know of change of inhaler, instructions on use and it has been sent to his pharmacy on file.  Advised to call back with any questions.

## 2020-09-14 NOTE — Telephone Encounter (Signed)
Okay to change to advair 250/50 one puff bid.

## 2020-09-15 NOTE — Telephone Encounter (Signed)
Called and spoke to pt and wife. They have picked up advair script and will use it once Stiolto has ran out. Pt comfortable with how to use the Advair and is aware to rinse mouth after each use. Nothing further needed at this time.

## 2020-09-22 DIAGNOSIS — R350 Frequency of micturition: Secondary | ICD-10-CM | POA: Diagnosis not present

## 2020-09-22 DIAGNOSIS — R3915 Urgency of urination: Secondary | ICD-10-CM | POA: Diagnosis not present

## 2020-09-22 DIAGNOSIS — N401 Enlarged prostate with lower urinary tract symptoms: Secondary | ICD-10-CM | POA: Diagnosis not present

## 2020-09-22 DIAGNOSIS — N3941 Urge incontinence: Secondary | ICD-10-CM | POA: Diagnosis not present

## 2020-09-23 ENCOUNTER — Other Ambulatory Visit: Payer: Self-pay

## 2020-09-23 ENCOUNTER — Encounter (HOSPITAL_COMMUNITY): Payer: Self-pay | Admitting: Internal Medicine

## 2020-09-23 ENCOUNTER — Ambulatory Visit (HOSPITAL_COMMUNITY)
Admission: RE | Admit: 2020-09-23 | Discharge: 2020-09-23 | Disposition: A | Discharge status: Home / Self Care | Payer: PPO | Source: Ambulatory Visit | Attending: Internal Medicine | Admitting: Internal Medicine

## 2020-09-23 VITALS — BP 122/70 | HR 84 | Ht 70.0 in | Wt 187.0 lb

## 2020-09-23 DIAGNOSIS — I5032 Chronic diastolic (congestive) heart failure: Secondary | ICD-10-CM | POA: Diagnosis not present

## 2020-09-23 DIAGNOSIS — Z7951 Long term (current) use of inhaled steroids: Secondary | ICD-10-CM | POA: Insufficient documentation

## 2020-09-23 DIAGNOSIS — E785 Hyperlipidemia, unspecified: Secondary | ICD-10-CM | POA: Diagnosis not present

## 2020-09-23 DIAGNOSIS — E669 Obesity, unspecified: Secondary | ICD-10-CM | POA: Insufficient documentation

## 2020-09-23 DIAGNOSIS — K589 Irritable bowel syndrome without diarrhea: Secondary | ICD-10-CM | POA: Diagnosis not present

## 2020-09-23 DIAGNOSIS — I482 Chronic atrial fibrillation, unspecified: Secondary | ICD-10-CM | POA: Diagnosis not present

## 2020-09-23 DIAGNOSIS — Z79899 Other long term (current) drug therapy: Secondary | ICD-10-CM | POA: Insufficient documentation

## 2020-09-23 DIAGNOSIS — I110 Hypertensive heart disease with heart failure: Secondary | ICD-10-CM | POA: Insufficient documentation

## 2020-09-23 DIAGNOSIS — N400 Enlarged prostate without lower urinary tract symptoms: Secondary | ICD-10-CM | POA: Insufficient documentation

## 2020-09-23 DIAGNOSIS — R918 Other nonspecific abnormal finding of lung field: Secondary | ICD-10-CM | POA: Insufficient documentation

## 2020-09-23 DIAGNOSIS — Z7901 Long term (current) use of anticoagulants: Secondary | ICD-10-CM | POA: Diagnosis not present

## 2020-09-23 DIAGNOSIS — G4733 Obstructive sleep apnea (adult) (pediatric): Secondary | ICD-10-CM | POA: Diagnosis not present

## 2020-09-23 LAB — BASIC METABOLIC PANEL
Anion gap: 9 (ref 5–15)
BUN: 13 mg/dL (ref 8–23)
CO2: 23 mmol/L (ref 22–32)
Calcium: 9.2 mg/dL (ref 8.9–10.3)
Chloride: 109 mmol/L (ref 98–111)
Creatinine, Ser: 1.13 mg/dL (ref 0.61–1.24)
GFR, Estimated: 59 mL/min — ABNORMAL LOW (ref >60)
Glucose, Bld: 120 mg/dL — ABNORMAL HIGH (ref 70–99)
Potassium: 4 mmol/L (ref 3.5–5.1)
Sodium: 141 mmol/L (ref 135–145)

## 2020-09-23 LAB — CBC
HCT: 43.3 % (ref 39.0–52.0)
Hemoglobin: 13.5 g/dL (ref 13.0–17.0)
MCH: 28.5 pg (ref 26.0–34.0)
MCHC: 31.2 g/dL (ref 30.0–36.0)
MCV: 91.4 fL (ref 80.0–100.0)
Platelets: 177 10*3/uL (ref 150–400)
RBC: 4.74 MIL/uL (ref 4.22–5.81)
RDW: 14.5 % (ref 11.5–15.5)
WBC: 6.5 10*3/uL (ref 4.0–10.5)
nRBC: 0 % (ref 0.0–0.2)

## 2020-09-23 LAB — BRAIN NATRIURETIC PEPTIDE: B Natriuretic Peptide: 139.2 pg/mL — ABNORMAL HIGH (ref 0.0–100.0)

## 2020-09-23 NOTE — Progress Notes (Signed)
CARDIOLOGY CLINIC NOTE  Patient ID: Maurice Wood, male   DOB: 1933-08-06, 86 y.o.   MRN: [id number]  HPI:  Maurice Wood is an 86 y.o. 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  Had episode of CP in January 2011 and underwent cath which showed normal coronaries with EF45% (? artificially low due to AF). Echo  2/11: 60-65%  Echo 7/16  EF 60% Mod MR.  ECHO 2017 EF 60-65%.   Echo 11/18 EF 60-65% severe MAC. Moderate MR  He was seen in the ER on 05/26/20 for progressive SOB and cough. No CP. hstrop normal. (7,7). BNP 287 (in setting of chronic AF)  ECG AF 105. CXR showed:  1. Small amount of posterior bilateral pleural fluid 2. Mass-like density in the right infrahilar region which may be due to prominent vascularity versus mass/adenopathy.  CT chest 06/09/20 concerning for moderate COPD and  ILD. Referred to Pulmonary   7/21 TEE to evaluate MR  EF 60-65% Normal RV  MV thickened with bileaflet prolapse 2-3+ MR  Here for f/u. Says he is doing pretty well. Still somewhat SOB at times. Not that active. Can do ADLs slowly. No edema, orthopnea or PND. Denies cough or wheezing. No bleeding on Eliquis. Taking 1/2 lasix as needed about 2-3 days per week.   Studies:  Echo 9/20 EF 60% mild-mod MR mild AI Severe LAE Personally reviewed  R/L cath 7/21  Ao = 129/73 (101) LV =  133/10 RA = 7 RV = 54/6 PA = 54/14 (30) PCW = 15 (V = 25) Fick cardiac output/index = 5.1/2.5 PVR = 3.0 Ao sat = 97% PA sat = 70%, 71%  Assessment: 1. Normal coronaries with left dominant system. RCA not injected but shown to be very small non-dominant vessel on previous study. LAD with small aneurysmal segment proximally but otherwise normal 2. EF 65% 3. Mild PAH with normal output. PCWP 15. V wave 22-25    Review of systems complete and found to be negative unless  listed in HPI.   Past Medical History:  Diagnosis Date  . Allergy    rhinitis  . Atrial fibrillation [facility]) Jan 2007   echo 1-07 normal ejection fraction, no significant valvular disease. adenosine cardiolite 1-07  no evidence of ischemia. A 48 hour Holter monitor in 7-07 showed good rate controlw/ chronic atrial fibrillation. Cath 1/11 normal cors. EF 45%. Echo 2-11 60-65%  . Benign prostatic hypertrophy   . Cerebral aneurysm    followed by Dr Estanislado Pandy  . Fatigue   . History of chest pain    a. s/p LHC 05/2014 with normal cors  . Hx of colonic polyps    diverticulosis  . Hyperlipidemia   . IBS (irritable bowel syndrome)   . Incontinence    Per pt 12/14/11  . Kidney stones   . Obesity   . Obstructive sleep apnea    noncompliant with CPAP  . Retinal vein occlusion     Current Outpatient Medications  Medication Sig Dispense Refill  . acetaminophen (TYLENOL) 500 MG tablet Take 500-1,000 mg by mouth every 6 (six) hours as needed for moderate pain.    Marland Kitchen albuterol (VENTOLIN HFA) 108 (90 Base) MCG/ACT inhaler Inhale 2 puffs into the lungs every 6 (six) hours as needed for wheezing or shortness of breath. 8 g 6  . apixaban (ELIQUIS) 5 MG TABS tablet Take  5 mg by mouth 2 (two) times daily.    . Biotin 1000 MCG CHEW Chew 1,000 mcg by mouth daily.    . cetirizine (ZYRTEC) 10 MG tablet Take 10 mg by mouth daily.    . chlorpheniramine (CHLOR-TRIMETON) 4 MG tablet Take 4 mg by mouth daily as needed for allergies.     . colestipol (COLESTID) 1 g tablet Take 2 g by mouth daily.    . famotidine (PEPCID) 20 MG tablet Take 20 mg by mouth daily as needed for heartburn or indigestion.    . finasteride (PROSCAR) 5 MG tablet Take 5 mg by mouth daily.    . fluticasone (FLONASE) 50 MCG/ACT nasal spray Place 1 spray into both nostrils daily.    . furosemide (LASIX) 40 MG tablet Take 1 tablet (40 mg total) by mouth daily. 30 tablet 3  . omeprazole (PRILOSEC) 20 MG capsule Take 20 mg by mouth daily.     .  ondansetron (ZOFRAN) 8 MG tablet 8 mg every 8 (eight) hours as needed.    . potassium chloride SA (KLOR-CON) 20 MEQ tablet Take 1 tablet (20 mEq total) by mouth daily. 30 tablet 3  . Tiotropium Bromide-Olodaterol (STIOLTO RESPIMAT) 2.5-2.5 MCG/ACT AERS Inhale into the lungs. 2 puffs daily    . vitamin B-12 (CYANOCOBALAMIN) 1000 MCG tablet Take 1,000 mcg by mouth every other day.     . Fluticasone-Salmeterol (ADVAIR DISKUS) 250-50 MCG/DOSE AEPB Inhale 1 puff into the lungs in the morning and at bedtime. (Patient not taking: Reported on 09/23/2020) 60 each 5   No current facility-administered medications for this encounter.     PHYSICAL EXAM: Vitals:   09/23/20 1000  BP: 122/70  Pulse: 84  SpO2: 96%   Wt Readings from Last 3 Encounters:  09/23/20 84.8 kg (187 lb)  08/08/20 85.7 kg (189 lb)  07/08/20 83.9 kg (185 lb)   Physical exam: General:  Elderly. No resp difficulty HEENT: normal Neck: supple. no JVD. Carotids 2+ bilat; no bruits. No lymphadenopathy or thryomegaly appreciated. Cor: PMI nondisplaced. Irregular rate & rhythm. 2/6 Lungs: minimal crackles Abdomen: soft, nontender, nondistended. No hepatosplenomegaly. No bruits or masses. Good bowel sounds. Extremities: no cyanosis, clubbing, rash, edema + compression socks Neuro: alert & orientedx3, cranial nerves grossly intact. moves all 4 extremities w/o difficulty. Affect pleasant   AF 80 frequent PVCs Personally reviewed   Lab Results  Component Value Date   CHOL 166 02/24/2014   HDL 53.80 02/24/2014   LDLCALC 84 02/24/2014   LDLDIRECT 97.4 11/09/2011   TRIG 143.0 02/24/2014   CHOLHDL 3 02/24/2014    ASSESSMENT & PLAN:  1. Chronic diastolic HF - stable. NYHA II-III - volume status stable on lasix - MR also playing a role - check labs  2. A-fib -  Permanent. - Rate controlled. Continue eliquis 5 mg twice a day.  - No significant bleeding  3. Moderate MR - Moderate MR on echo 10/2017. EF 60-65% Severe MAC  ?prolapse, but leaflets not seen well.  - Echo 9/20 with mild to moderate MR (Personally reviewed). EF and LV dimensions stable. Continue to follow  - TEE 7/21 with 2-3+ MR - If symptoms worsen would consider Clip  4. OSA - Complaint with CPAP   5. Abnormal chest CT - concerning for ILD. Has seen Dr. Halford Chessman.  - I will touch base with Dr. Drema Pry MD 10:23 AM

## 2020-09-23 NOTE — Patient Instructions (Signed)
It was great to see you today! No medication changes are needed at this time.   Labs today We will only contact you if something comes back abnormal or we need to make some changes. Otherwise no news is good news!  Your physician recommends that you schedule a follow-up appointment in: 9 months with Dr Haroldine Laws and echo  Your physician has requested that you have an echocardiogram. Echocardiography is a painless test that uses sound waves to create images of your heart. It provides your doctor with information about the size and shape of your heart and how well your heart's chambers and valves are working. This procedure takes approximately one hour. There are no restrictions for this procedure.  If you have any questions or concerns before your next appointment please send [place] a message through [place] or call our office at [phone].    TO LEAVE A MESSAGE FOR THE NURSE SELECT OPTION 2, PLEASE LEAVE A MESSAGE INCLUDING: . YOUR NAME . DATE OF BIRTH . CALL BACK NUMBER . REASON FOR CALL**this is important as we prioritize the call backs  YOU WILL RECEIVE A CALL BACK THE SAME DAY AS LONG AS YOU CALL BEFORE 4:00 PM

## 2020-09-23 NOTE — Progress Notes (Signed)
Medication Samples have been provided to the patient.  Drug name: eliquis       Strength: 5mg         Qty: 56 LOT: [id number]  Exp.Date: 01/2022  Dosing instructions: ONE TAB TWICE A DAY  The patient has been instructed regarding the correct time, dose, and frequency of taking this medication, including desired effects and most common side effects.   [name] M 11:16 AM 09/23/2020

## 2020-09-27 DIAGNOSIS — G4733 Obstructive sleep apnea (adult) (pediatric): Secondary | ICD-10-CM | POA: Diagnosis not present

## 2020-09-28 ENCOUNTER — Ambulatory Visit: Payer: PPO

## 2020-09-29 DIAGNOSIS — R350 Frequency of micturition: Secondary | ICD-10-CM | POA: Diagnosis not present

## 2020-09-29 DIAGNOSIS — R3915 Urgency of urination: Secondary | ICD-10-CM | POA: Diagnosis not present

## 2020-09-29 DIAGNOSIS — N3941 Urge incontinence: Secondary | ICD-10-CM | POA: Diagnosis not present

## 2020-10-07 DIAGNOSIS — K580 Irritable bowel syndrome with diarrhea: Secondary | ICD-10-CM | POA: Diagnosis not present

## 2020-10-07 DIAGNOSIS — K219 Gastro-esophageal reflux disease without esophagitis: Secondary | ICD-10-CM | POA: Diagnosis not present

## 2020-10-07 DIAGNOSIS — R110 Nausea: Secondary | ICD-10-CM | POA: Diagnosis not present

## 2020-10-14 DIAGNOSIS — N3941 Urge incontinence: Secondary | ICD-10-CM | POA: Diagnosis not present

## 2020-10-14 DIAGNOSIS — R350 Frequency of micturition: Secondary | ICD-10-CM | POA: Diagnosis not present

## 2020-10-14 DIAGNOSIS — N401 Enlarged prostate with lower urinary tract symptoms: Secondary | ICD-10-CM | POA: Diagnosis not present

## 2020-10-25 DIAGNOSIS — N401 Enlarged prostate with lower urinary tract symptoms: Secondary | ICD-10-CM | POA: Diagnosis not present

## 2020-10-25 DIAGNOSIS — N138 Other obstructive and reflux uropathy: Secondary | ICD-10-CM | POA: Diagnosis not present

## 2020-10-25 DIAGNOSIS — N3941 Urge incontinence: Secondary | ICD-10-CM | POA: Diagnosis not present

## 2020-10-26 ENCOUNTER — Ambulatory Visit: Payer: PPO | Admitting: Podiatry

## 2020-10-26 ENCOUNTER — Encounter: Payer: Self-pay | Admitting: Podiatry

## 2020-10-26 ENCOUNTER — Other Ambulatory Visit: Payer: Self-pay

## 2020-10-26 DIAGNOSIS — M79674 Pain in right toe(s): Secondary | ICD-10-CM | POA: Diagnosis not present

## 2020-10-26 DIAGNOSIS — M79675 Pain in left toe(s): Secondary | ICD-10-CM

## 2020-10-26 DIAGNOSIS — B351 Tinea unguium: Secondary | ICD-10-CM | POA: Diagnosis not present

## 2020-10-26 DIAGNOSIS — E1142 Type 2 diabetes mellitus with diabetic polyneuropathy: Secondary | ICD-10-CM | POA: Diagnosis not present

## 2020-10-26 DIAGNOSIS — D689 Coagulation defect, unspecified: Secondary | ICD-10-CM

## 2020-10-26 NOTE — Progress Notes (Signed)
This patient returns to my office for at risk foot care.  This patient requires this care by a professional since this patient will be at risk due to having diabetic neuropathy, and coagulation disorder.  Patient is taking eliquiss.  This patient is unable to cut nails himself since the patient cannot reach his nails.These nails are painful walking and wearing shoes.  This patient presents for at risk foot care today.  General Appearance  Alert, conversant and in no acute stress.  Vascular  Dorsalis pedis and posterior tibial  pulses are  not palpable  bilaterally.  Capillary return is within normal limits  bilaterally. Temperature is within normal limits  bilaterally.  Neurologic  Senn-Weinstein monofilament wire test absent   bilaterally. Muscle power within normal limits bilaterally.  Nails Thick disfigured discolored nails with subungual debris  hallux nails  bilaterally. No evidence of bacterial infection or drainage bilaterally.  Orthopedic  No limitations of motion  feet .  No crepitus or effusions noted.  No bony pathology or digital deformities noted.  Skin  normotropic skin with no porokeratosis noted bilaterally.  No signs of infections or ulcers noted.     Onychomycosis  Pain in right toes  Pain in left toes  Consent was obtained for treatment procedures.   Mechanical debridement of nails 1-5  bilaterally performed with a nail nipper.  Filed with dremel without incident.    Return office visit    3 months                  Told patient to return for periodic foot care and evaluation due to potential at risk complications.   Chaniqua Brisby DPM  

## 2020-10-31 DIAGNOSIS — G4733 Obstructive sleep apnea (adult) (pediatric): Secondary | ICD-10-CM | POA: Diagnosis not present

## 2020-11-09 DIAGNOSIS — Z79899 Other long term (current) drug therapy: Secondary | ICD-10-CM | POA: Diagnosis not present

## 2020-11-11 DIAGNOSIS — Z8709 Personal history of other diseases of the respiratory system: Secondary | ICD-10-CM | POA: Diagnosis not present

## 2020-11-11 DIAGNOSIS — J0140 Acute pansinusitis, unspecified: Secondary | ICD-10-CM | POA: Diagnosis not present

## 2020-11-11 DIAGNOSIS — R0609 Other forms of dyspnea: Secondary | ICD-10-CM | POA: Diagnosis not present

## 2020-11-26 DIAGNOSIS — J40 Bronchitis, not specified as acute or chronic: Secondary | ICD-10-CM | POA: Diagnosis not present

## 2020-11-26 DIAGNOSIS — Z8709 Personal history of other diseases of the respiratory system: Secondary | ICD-10-CM | POA: Diagnosis not present

## 2020-11-26 DIAGNOSIS — Z03818 Encounter for observation for suspected exposure to other biological agents ruled out: Secondary | ICD-10-CM | POA: Diagnosis not present

## 2020-11-26 DIAGNOSIS — R059 Cough, unspecified: Secondary | ICD-10-CM | POA: Diagnosis not present

## 2020-12-04 LAB — ECHO TEE
MV M vel: 5.01 m/s
MV Peak grad: 100.4 mmHg
Radius: 0.5 cm

## 2020-12-06 ENCOUNTER — Other Ambulatory Visit: Payer: Self-pay

## 2020-12-06 ENCOUNTER — Ambulatory Visit (INDEPENDENT_AMBULATORY_CARE_PROVIDER_SITE_OTHER): Payer: PPO | Admitting: Pulmonary Disease

## 2020-12-06 DIAGNOSIS — J449 Chronic obstructive pulmonary disease, unspecified: Secondary | ICD-10-CM

## 2020-12-06 LAB — PULMONARY FUNCTION TEST
DL/VA % pred: 60 %
DL/VA: 2.29 ml/min/mmHg/L
DLCO cor % pred: 47 %
DLCO cor: 11.24 ml/min/mmHg
DLCO unc % pred: 47 %
DLCO unc: 11.24 ml/min/mmHg
FEF 25-75 Post: 2.58 L/sec
FEF 25-75 Pre: 1.08 L/sec
FEF2575-%Change-Post: 138 %
FEF2575-%Pred-Post: 149 %
FEF2575-%Pred-Pre: 62 %
FEV1-%Change-Post: 29 %
FEV1-%Pred-Post: 90 %
FEV1-%Pred-Pre: 70 %
FEV1-Post: 2.42 L
FEV1-Pre: 1.87 L
FEV1FVC-%Change-Post: 22 %
FEV1FVC-%Pred-Pre: 91 %
FEV6-%Change-Post: 5 %
FEV6-%Pred-Post: 86 %
FEV6-%Pred-Pre: 82 %
FEV6-Post: 3.07 L
FEV6-Pre: 2.9 L
FEV6FVC-%Pred-Post: 107 %
FEV6FVC-%Pred-Pre: 107 %
FVC-%Change-Post: 5 %
FVC-%Pred-Post: 80 %
FVC-%Pred-Pre: 76 %
FVC-Post: 3.07 L
FVC-Pre: 2.9 L
Post FEV1/FVC ratio: 79 %
Post FEV6/FVC ratio: 100 %
Pre FEV1/FVC ratio: 65 %
Pre FEV6/FVC Ratio: 100 %
RV % pred: 94 %
RV: 2.62 L
TLC % pred: 78 %
TLC: 5.55 L

## 2020-12-06 NOTE — Progress Notes (Signed)
Full PFT performed today. °

## 2020-12-12 ENCOUNTER — Telehealth: Payer: Self-pay | Admitting: Pulmonary Disease

## 2020-12-12 DIAGNOSIS — Z79899 Other long term (current) drug therapy: Secondary | ICD-10-CM | POA: Diagnosis not present

## 2020-12-12 DIAGNOSIS — R7989 Other specified abnormal findings of blood chemistry: Secondary | ICD-10-CM | POA: Diagnosis not present

## 2020-12-12 NOTE — Telephone Encounter (Signed)
PFT 12/06/20 >> FEV1 1.87 (70%), FEV1% 65, TLC 5.55 (78%), DLCO 47%    Please schedule ROV to review PFT results.
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
## 2020-12-12 NOTE — Telephone Encounter (Signed)
Called and left message on voicemail to please return phone call to schedule office visit. Contact number provided.

## 2020-12-13 ENCOUNTER — Other Ambulatory Visit: Payer: Self-pay

## 2020-12-13 ENCOUNTER — Ambulatory Visit: Payer: PPO | Admitting: Neurology

## 2020-12-13 ENCOUNTER — Encounter: Payer: Self-pay | Admitting: Neurology

## 2020-12-13 VITALS — BP 114/68 | HR 80 | Resp 20 | Ht 70.0 in | Wt 188.0 lb

## 2020-12-13 DIAGNOSIS — Z79899 Other long term (current) drug therapy: Secondary | ICD-10-CM | POA: Diagnosis not present

## 2020-12-13 DIAGNOSIS — G44219 Episodic tension-type headache, not intractable: Secondary | ICD-10-CM | POA: Diagnosis not present

## 2020-12-13 DIAGNOSIS — G250 Essential tremor: Secondary | ICD-10-CM

## 2020-12-13 DIAGNOSIS — I671 Cerebral aneurysm, nonruptured: Secondary | ICD-10-CM | POA: Diagnosis not present

## 2020-12-13 DIAGNOSIS — G609 Hereditary and idiopathic neuropathy, unspecified: Secondary | ICD-10-CM

## 2020-12-13 DIAGNOSIS — H35372 Puckering of macula, left eye: Secondary | ICD-10-CM | POA: Diagnosis not present

## 2020-12-13 DIAGNOSIS — G4762 Sleep related leg cramps: Secondary | ICD-10-CM

## 2020-12-13 DIAGNOSIS — H43813 Vitreous degeneration, bilateral: Secondary | ICD-10-CM | POA: Diagnosis not present

## 2020-12-13 DIAGNOSIS — H30031 Focal chorioretinal inflammation, peripheral, right eye: Secondary | ICD-10-CM | POA: Diagnosis not present

## 2020-12-13 DIAGNOSIS — Z961 Presence of intraocular lens: Secondary | ICD-10-CM | POA: Diagnosis not present

## 2020-12-13 DIAGNOSIS — H35352 Cystoid macular degeneration, left eye: Secondary | ICD-10-CM | POA: Diagnosis not present

## 2020-12-13 DIAGNOSIS — H34832 Tributary (branch) retinal vein occlusion, left eye, with macular edema: Secondary | ICD-10-CM | POA: Diagnosis not present

## 2020-12-13 NOTE — Patient Instructions (Signed)
1.  The shaking in the hands is likely benign essential tremor.  Monitor 2.  For leg cramps, try drinking tonic water and may use an ointment/cream such as Biofreeze to soothe it. 3.  Follow up one year

## 2020-12-15 NOTE — Telephone Encounter (Signed)
Called and patient scheduled office visit per Dr Craige Cotta for Tuesday 01/31/2021 at 9:15am with Dr Craige Cotta at the [place] office to go over PFT results. Patient agreeable to time, date and location. Nothing further needed at this time.

## 2020-12-23 ENCOUNTER — Other Ambulatory Visit (HOSPITAL_COMMUNITY): Payer: Self-pay | Admitting: Internal Medicine

## 2021-01-09 DIAGNOSIS — G4733 Obstructive sleep apnea (adult) (pediatric): Secondary | ICD-10-CM | POA: Diagnosis not present

## 2021-01-19 DIAGNOSIS — R7303 Prediabetes: Secondary | ICD-10-CM | POA: Diagnosis not present

## 2021-01-19 DIAGNOSIS — M545 Low back pain, unspecified: Secondary | ICD-10-CM | POA: Diagnosis not present

## 2021-01-19 DIAGNOSIS — I482 Chronic atrial fibrillation, unspecified: Secondary | ICD-10-CM | POA: Diagnosis not present

## 2021-01-19 DIAGNOSIS — R42 Dizziness and giddiness: Secondary | ICD-10-CM | POA: Diagnosis not present

## 2021-01-19 DIAGNOSIS — K219 Gastro-esophageal reflux disease without esophagitis: Secondary | ICD-10-CM | POA: Diagnosis not present

## 2021-01-19 DIAGNOSIS — I700 Atherosclerosis of aorta: Secondary | ICD-10-CM | POA: Diagnosis not present

## 2021-01-19 DIAGNOSIS — R7309 Other abnormal glucose: Secondary | ICD-10-CM | POA: Diagnosis not present

## 2021-01-19 DIAGNOSIS — D6869 Other thrombophilia: Secondary | ICD-10-CM | POA: Diagnosis not present

## 2021-01-19 DIAGNOSIS — J302 Other seasonal allergic rhinitis: Secondary | ICD-10-CM | POA: Diagnosis not present

## 2021-01-19 DIAGNOSIS — R609 Edema, unspecified: Secondary | ICD-10-CM | POA: Diagnosis not present

## 2021-01-19 DIAGNOSIS — E559 Vitamin D deficiency, unspecified: Secondary | ICD-10-CM | POA: Diagnosis not present

## 2021-01-19 DIAGNOSIS — E538 Deficiency of other specified B group vitamins: Secondary | ICD-10-CM | POA: Diagnosis not present

## 2021-01-23 DIAGNOSIS — D3612 Benign neoplasm of peripheral nerves and autonomic nervous system, upper limb, including shoulder: Secondary | ICD-10-CM | POA: Diagnosis not present

## 2021-01-23 DIAGNOSIS — Z85828 Personal history of other malignant neoplasm of skin: Secondary | ICD-10-CM | POA: Diagnosis not present

## 2021-01-23 DIAGNOSIS — C44719 Basal cell carcinoma of skin of left lower limb, including hip: Secondary | ICD-10-CM | POA: Diagnosis not present

## 2021-01-23 DIAGNOSIS — L570 Actinic keratosis: Secondary | ICD-10-CM | POA: Diagnosis not present

## 2021-01-23 DIAGNOSIS — L111 Transient acantholytic dermatosis [Grover]: Secondary | ICD-10-CM | POA: Diagnosis not present

## 2021-01-31 ENCOUNTER — Other Ambulatory Visit: Payer: Self-pay

## 2021-01-31 ENCOUNTER — Ambulatory Visit: Payer: PPO | Admitting: Pulmonary Disease

## 2021-01-31 ENCOUNTER — Encounter: Payer: Self-pay | Admitting: Pulmonary Disease

## 2021-01-31 VITALS — BP 118/58 | HR 70 | Temp 97.2°F | Ht 70.0 in | Wt 185.2 lb

## 2021-01-31 DIAGNOSIS — R911 Solitary pulmonary nodule: Secondary | ICD-10-CM

## 2021-01-31 DIAGNOSIS — Z9989 Dependence on other enabling machines and devices: Secondary | ICD-10-CM | POA: Diagnosis not present

## 2021-01-31 DIAGNOSIS — J432 Centrilobular emphysema: Secondary | ICD-10-CM

## 2021-01-31 DIAGNOSIS — G4733 Obstructive sleep apnea (adult) (pediatric): Secondary | ICD-10-CM | POA: Diagnosis not present

## 2021-01-31 DIAGNOSIS — J849 Interstitial pulmonary disease, unspecified: Secondary | ICD-10-CM

## 2021-01-31 NOTE — Progress Notes (Signed)
[facility]  Chief Complaint  Patient presents with  . Follow-up    No complaints currently    Constitutional:  BP (!) 118/58 (BP Location: Right Arm, Cuff Size: Normal)   Pulse 70   Temp (!) 97.2 F (36.2 C) (Temporal)   Ht 5\' 10"  (1.778 m)   Wt 185 lb 3.2 oz (84 kg)   SpO2 95% Comment: Room air  BMI 26.57 kg/m   Past Medical History:  Nephrolithiasis, IBS, HLD, Colon polyps, Cerebral aneurysm, BPH, A fib, Cystoid macular degeneration, Idiopathic peripheral neuropathy  Past Surgical History:  He  has a past surgical history that includes Cholecystectomy; inguinal and umbilical herniorrhaphy; Tonsillectomy and adenoidectomy (as a child); Cardiac catheterization (1999); Hernia repair; left heart catheterization with coronary angiogram (N/A, 06/01/2014); Lithotripsy; Interstim Implant placement (04/2017); TEE without cardioversion (N/A, 07/05/2020); and RIGHT/LEFT HEART CATH AND CORONARY ANGIOGRAPHY (N/A, 07/08/2020).  Brief Summary:  Maurice Wood is a 87 y.o. male former smoker with obstructive sleep apnea, COPD, lung nodule and mild ILD.      Subjective:   Uses Advair.  This helps.  Occasionally needs albuterol.  Has intermittent cough.  No sputum, wheeze, chest pain, fever, or hemoptysis.  Gets winded if he walks too quickly.  Uses CPAP nightly.  No issues with mask fit or pressure setting.  Was taken off imuran.  Told eye issue resolved.  Physical Exam:   Appearance - well kempt   ENMT - no sinus tenderness, no oral exudate, no LAN, Mallampati 2 airway, no stridor  Respiratory - equal breath sounds bilaterally, no wheezing or rales  CV - s1s2 regular rate and rhythm, 2/6 murmur  Ext - no clubbing, no edema  Skin - no rashes  Psych - normal mood and affect    Pulmonary testing:   PFT 08/08/20 >> FEV1 1.67 (61%), FEV1% 55, TLC 4.74 (67%), DLCO 50%  PFT 12/06/20 >> FEV1 1.87 (70%), FEV1% 65, TLC 5.55 (78%), DLCO  47%  Chest Imaging:   CT chest 06/10/20 >> atherosclerosis, mod HH, 4 mm RML nodule, calcified granulomas, bronchial thickening, mild centrilobular and paraseptal emphysema, areas of GGO with mild septal thickening and thickening of peribronchovascular interstitium in lower lobes b/l  Sleep Tests:   PSG 11/09/17 >> AHI 40.8, SpO2 79%  CPAP titration 03/25/18 >> CPAP 12 cm H2O >> AHI 0.  CPAP 12/31/20 to 01/29/21 >> used on 29 of 30 nights with average 8 hrs 49 min.  Average AHI 0.8 with CPAP 12 cm H2O  Cardiac Tests:   Echo 06/09/20 >> EF 60 to 65%, mod LA/RA dilation, mod/severe MR, ascending aorta 39 mm  Social History:  He  reports that he quit smoking about 49 years ago. His smoking use included cigarettes. He has a 20.00 pack-year smoking history. He has never used smokeless tobacco. He reports current alcohol use of about 1.0 standard drink of alcohol per week. He reports that he does not use drugs.  Family History:  His family history includes Alcohol abuse in his father; Aneurysm in his father; Cancer in his father and maternal grandmother; Diabetes in his maternal uncle, paternal uncle, and son; Heart disease in his paternal grandfather; Sudden death in his mother.     Assessment/Plan:   COPD with emphysema. - continue advair and prn albuterol  Mild ILD. - has f/u CT chest scheduled for July 2022  Lung nodule. - f/u CT chest in July 2022  Obstructive sleep apnea. - he  is compliant with CPAP and reports benefit from therapy - uses [employer] for his DME - continue CPAP 12 cm H2O - discussed Inspire device; he opted to continue CPAP  Permanent A fib, mild/mod MR. - he has follow up with Dr. Haroldine Laws scheduled  Chorioretinal inflammation of Rt eye. - off prednisone, imuran - previously treated with MTX - followed by Dr. Dwana Melena with ophthalmology at [facility]  Idiopathic peripheral neuropathy. - followed by Dr. Tomi Likens with [facility]  Time Spent Involved in  Patient Care on Day of Examination:  33 minutes  Follow up:  Patient Instructions  Follow up in 6 months   Medication List:   Allergies as of 01/31/2021      Reactions   Epinephrine Palpitations   Increased Heart Rate   Nsaids Other (See Comments)   NOT WHILE TAKING ELIQUIS   Clarithromycin Other (See Comments)   headache   Famotidine    Other reaction(s): NAUSEA   Iodinated Diagnostic Agents Other (See Comments)   Pt states "turning bright red"   Nimodipine    Flushing      Medication List       Accurate as of January 31, 2021  9:59 AM. If you have any questions, ask your nurse or doctor.        STOP taking these medications   famotidine 20 MG tablet Commonly known as: PEPCID Stopped by: Chesley Mires, MD   Stiolto Respimat 2.5-2.5 MCG/ACT Aers Generic drug: Tiotropium Bromide-Olodaterol Stopped by: Chesley Mires, MD     TAKE these medications   acetaminophen 500 MG tablet Commonly known as: TYLENOL Take 500-1,000 mg by mouth every 6 (six) hours as needed for moderate pain.   albuterol 108 (90 Base) MCG/ACT inhaler Commonly known as: VENTOLIN HFA Inhale 2 puffs into the lungs every 6 (six) hours as needed for wheezing or shortness of breath.   Biotin 1000 MCG Chew Chew 1,000 mcg by mouth daily.   cetirizine 10 MG tablet Commonly known as: ZYRTEC Take 10 mg by mouth daily.   chlorpheniramine 4 MG tablet Commonly known as: CHLOR-TRIMETON Take 4 mg by mouth daily as needed for allergies.   colestipol 1 g tablet Commonly known as: COLESTID Take 2 g by mouth daily.   diazepam 10 MG tablet Commonly known as: VALIUM SMARTSIG:1 Tablet(s) By Mouth   Eliquis 5 MG Tabs tablet Generic drug: apixaban TAKE 1 TABLET(5 MG) BY MOUTH TWICE DAILY   finasteride 5 MG tablet Commonly known as: PROSCAR Take 5 mg by mouth daily.   fluticasone 50 MCG/ACT nasal spray Commonly known as: FLONASE Place 1 spray into both nostrils daily.   Fluticasone-Salmeterol  250-50 MCG/DOSE Aepb Commonly known as: Advair Diskus Inhale 1 puff into the lungs in the morning and at bedtime.   furosemide 40 MG tablet Commonly known as: LASIX Take 1 tablet (40 mg total) by mouth daily. What changed:   when to take this  reasons to take this   omeprazole 20 MG capsule Commonly known as: PRILOSEC Take 20 mg by mouth daily.   ondansetron 8 MG tablet Commonly known as: ZOFRAN 8 mg every 8 (eight) hours as needed.   potassium chloride SA 20 MEQ tablet Commonly known as: KLOR-CON Take 1 tablet (20 mEq total) by mouth daily. What changed:   when to take this  reasons to take this  additional instructions   vitamin B-12 1000 MCG tablet Commonly known as: CYANOCOBALAMIN Take 1,000 mcg by mouth every other day.  Signature:  Chesley Mires, MD [place] Pager - [phone] 01/31/2021, 9:59 AM

## 2021-01-31 NOTE — Patient Instructions (Signed)
Follow up in 6 months 

## 2021-02-01 ENCOUNTER — Other Ambulatory Visit: Payer: Self-pay

## 2021-02-01 ENCOUNTER — Encounter: Payer: Self-pay | Admitting: Podiatry

## 2021-02-01 ENCOUNTER — Ambulatory Visit: Payer: PPO | Admitting: Podiatry

## 2021-02-01 DIAGNOSIS — E1142 Type 2 diabetes mellitus with diabetic polyneuropathy: Secondary | ICD-10-CM

## 2021-02-01 DIAGNOSIS — H3090 Unspecified chorioretinal inflammation, unspecified eye: Secondary | ICD-10-CM | POA: Insufficient documentation

## 2021-02-01 DIAGNOSIS — J302 Other seasonal allergic rhinitis: Secondary | ICD-10-CM | POA: Insufficient documentation

## 2021-02-01 DIAGNOSIS — M79674 Pain in right toe(s): Secondary | ICD-10-CM

## 2021-02-01 DIAGNOSIS — G629 Polyneuropathy, unspecified: Secondary | ICD-10-CM | POA: Insufficient documentation

## 2021-02-01 DIAGNOSIS — E559 Vitamin D deficiency, unspecified: Secondary | ICD-10-CM | POA: Insufficient documentation

## 2021-02-01 DIAGNOSIS — R911 Solitary pulmonary nodule: Secondary | ICD-10-CM | POA: Insufficient documentation

## 2021-02-01 DIAGNOSIS — G4700 Insomnia, unspecified: Secondary | ICD-10-CM | POA: Insufficient documentation

## 2021-02-01 DIAGNOSIS — M79675 Pain in left toe(s): Secondary | ICD-10-CM | POA: Diagnosis not present

## 2021-02-01 DIAGNOSIS — N3281 Overactive bladder: Secondary | ICD-10-CM | POA: Insufficient documentation

## 2021-02-01 DIAGNOSIS — J849 Interstitial pulmonary disease, unspecified: Secondary | ICD-10-CM | POA: Insufficient documentation

## 2021-02-01 DIAGNOSIS — R110 Nausea: Secondary | ICD-10-CM | POA: Insufficient documentation

## 2021-02-01 DIAGNOSIS — D689 Coagulation defect, unspecified: Secondary | ICD-10-CM

## 2021-02-01 DIAGNOSIS — B351 Tinea unguium: Secondary | ICD-10-CM

## 2021-02-01 DIAGNOSIS — G8929 Other chronic pain: Secondary | ICD-10-CM | POA: Insufficient documentation

## 2021-02-01 NOTE — Progress Notes (Signed)
This patient returns to my office for at risk foot care.  This patient requires this care by a professional since this patient will be at risk due to having diabetic neuropathy, and coagulation disorder.  Patient is taking eliquiss.  This patient is unable to cut nails himself since the patient cannot reach his nails.These nails are painful walking and wearing shoes.  This patient presents for at risk foot care today.  General Appearance  Alert, conversant and in no acute stress.  Vascular  Dorsalis pedis and posterior tibial  pulses are  not palpable  bilaterally.  Capillary return is within normal limits  bilaterally. Temperature is within normal limits  bilaterally.  Neurologic  Senn-Weinstein monofilament wire test absent   bilaterally. Muscle power within normal limits bilaterally.  Nails Thick disfigured discolored nails with subungual debris  hallux nails  bilaterally. No evidence of bacterial infection or drainage bilaterally.  Orthopedic  No limitations of motion  feet .  No crepitus or effusions noted.  No bony pathology or digital deformities noted.  Skin  normotropic skin with no porokeratosis noted bilaterally.  No signs of infections or ulcers noted.     Onychomycosis  Pain in right toes  Pain in left toes  Consent was obtained for treatment procedures.   Mechanical debridement of nails 1-5  bilaterally performed with a nail nipper.  Filed with dremel without incident.    Return office visit    3 months                  Told patient to return for periodic foot care and evaluation due to potential at risk complications.   Jeslynn Hollander DPM  

## 2021-02-22 DIAGNOSIS — M25511 Pain in right shoulder: Secondary | ICD-10-CM | POA: Diagnosis not present

## 2021-02-22 DIAGNOSIS — M19011 Primary osteoarthritis, right shoulder: Secondary | ICD-10-CM | POA: Diagnosis not present

## 2021-03-13 DIAGNOSIS — R7989 Other specified abnormal findings of blood chemistry: Secondary | ICD-10-CM | POA: Diagnosis not present

## 2021-03-14 DIAGNOSIS — N401 Enlarged prostate with lower urinary tract symptoms: Secondary | ICD-10-CM | POA: Diagnosis not present

## 2021-03-14 DIAGNOSIS — N3941 Urge incontinence: Secondary | ICD-10-CM | POA: Diagnosis not present

## 2021-03-14 DIAGNOSIS — R350 Frequency of micturition: Secondary | ICD-10-CM | POA: Diagnosis not present

## 2021-03-24 ENCOUNTER — Other Ambulatory Visit: Payer: Self-pay | Admitting: Pulmonary Disease

## 2021-04-02 NOTE — Progress Notes (Signed)
CARDIOLOGY CLINIC NOTE  Patient ID: Maurice Wood, male   DOB: 1933/06/10, 87 y.o.   MRN: [id number]  HPI:  Maurice Wood is an 87 y.o. 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  Had episode of CP in January 2011 and underwent cath which showed normal coronaries with EF45% (? artificially low due to AF). Echo  2/11: 60-65%  Echo 7/16  EF 60% Mod MR.  ECHO 2017 EF 60-65%.   Echo 11/18 EF 60-65% severe MAC. Moderate MR  He was seen in the ER in 6/21 for progressive SOB and cough. No CP. hstrop normal. (7,7). BNP 287 (in setting of chronic AF)  ECG AF 105. CXR showed mass-like density in the right infrahilar region   CT chest 06/09/20 concerning for moderate COPD and  ILD. Referred to Pulmonary   7/21 TEE to evaluate MR  EF 60-65% Normal RV  MV thickened with bileaflet prolapse 2-3+ MR  Here for f/u. Says he is doing pretty well. Still somewhat SOB at times. Had a recent fall. Doesn't remember details except he fell on his knees. Denies palpitations or LOC. Couldn't get up by himself. Has been struggling with a stomach virus for several days which is now getting better. No edema, orthopnea or PND. Not taking any lasix currently. SBP 110-120    Studies:  Echo 9/20 EF 60% mild-mod MR mild AI Severe LAE Personally reviewed  R/L cath 7/21  Ao = 129/73 (101) LV =  133/10 RA = 7 RV = 54/6 PA = 54/14 (30) PCW = 15 (V = 25) Fick cardiac output/index = 5.1/2.5 PVR = 3.0 Ao sat = 97% PA sat = 70%, 71%  Assessment: 1. Normal coronaries with left dominant system. RCA not injected but shown to be very small non-dominant vessel on previous study. LAD with small aneurysmal segment proximally but otherwise normal 2. EF 65% 3. Mild PAH with normal output. PCWP 15. V wave 22-25   PFTs 12/21: FEV1 1.87 (70%), FVC 2.90 (76%) DLCO 60%   Review of systems complete and found to be negative unless  listed in HPI.   Past Medical History:  Diagnosis Date  . Allergy    rhinitis  . Atrial fibrillation Maurice Wood) Jan 2007   echo 1-07 normal ejection fraction, no significant valvular disease. adenosine cardiolite 1-07  no evidence of ischemia. A 48 hour Holter monitor in 7-07 showed good rate controlw/ chronic atrial fibrillation. Cath 1/11 normal cors. EF 45%. Echo 2-11 60-65%  . Benign prostatic hypertrophy   . Cerebral aneurysm    followed by Maurice Wood  . CHF (congestive heart failure) ([place])   . Fatigue   . History of chest pain    a. s/p LHC 05/2014 with normal cors  . Hx of colonic polyps    diverticulosis  . Hyperlipidemia   . IBS (irritable bowel syndrome)   . Incontinence    Per pt 12/14/11  . Kidney stones   . Obesity   . Obstructive sleep apnea    noncompliant with CPAP  . Retinal vein occlusion     Current Outpatient Medications  Medication Sig Dispense Refill  . acetaminophen (TYLENOL) 500 MG tablet Take 500-1,000 mg by mouth every 6 (six) hours as needed for moderate pain.    Marland Kitchen albuterol (VENTOLIN HFA) 108 (90 Base) MCG/ACT inhaler Inhale 2 puffs into the lungs every 6 (six) hours as needed for wheezing or  shortness of breath. 8 g 6  . cetirizine (ZYRTEC) 10 MG tablet Take 10 mg by mouth daily.    . chlorpheniramine (CHLOR-TRIMETON) 4 MG tablet Take 4 mg by mouth daily as needed for allergies.     . cholecalciferol (VITAMIN D3) 25 MCG (1000 UNIT) tablet 1 tablet    . colestipol (COLESTID) 1 g tablet Take 2 g by mouth daily.    . diazepam (VALIUM) 10 MG tablet SMARTSIG:1 Tablet(s) By Mouth    . ELIQUIS 5 MG TABS tablet TAKE 1 TABLET(5 MG) BY MOUTH TWICE DAILY 180 tablet 3  . finasteride (PROSCAR) 5 MG tablet Take 5 mg by mouth daily.    . fluticasone (FLONASE) 50 MCG/ACT nasal spray Place 1 spray into both nostrils daily.    . Fluticasone-Salmeterol (ADVAIR) 250-50 MCG/DOSE AEPB INHALE 1 PUFF INTO THE LUNGS IN THE MORNING AND AT BEDTIME 60 each 5  . furosemide (LASIX)  40 MG tablet Take 40 mg by mouth daily as needed.    Marland Kitchen omeprazole (PRILOSEC) 20 MG capsule Take 20 mg by mouth daily.     . ondansetron (ZOFRAN) 8 MG tablet 8 mg every 8 (eight) hours as needed.    . potassium chloride (KLOR-CON) 20 MEQ packet Take 20 mEq by mouth daily as needed.    . vitamin B-12 (CYANOCOBALAMIN) 1000 MCG tablet Take 1,000 mcg by mouth every other day.      No current facility-administered medications for this encounter.     PHYSICAL EXAM: Vitals:   04/06/21 0936  BP: 110/72  Pulse: 71  SpO2: 94%   Wt Readings from Last 3 Encounters:  04/06/21 82.3 kg  01/31/21 84 kg  12/13/20 85.3 kg   Physical exam: General:  Well appearing. No resp difficulty HEENT: normal Neck: supple. no JVD. Carotids 2+ bilat; no bruits. No lymphadenopathy or thryomegaly appreciated. Cor: PMI nondisplaced. Irregular rate & rhythm. 2/6 brief mid-systolic MR Lungs: clear Abdomen: soft, nontender, nondistended. No hepatosplenomegaly. No bruits or masses. Good bowel sounds. Extremities: no cyanosis, clubbing, rash, edema several bruises Neuro: alert & orientedx3, cranial nerves grossly intact. moves all 4 extremities w/o difficulty. Affect pleasant    Lab Results  Component Value Date   CHOL 166 02/24/2014   HDL 53.80 02/24/2014   LDLCALC 84 02/24/2014   LDLDIRECT 97.4 11/09/2011   TRIG 143.0 02/24/2014   CHOLHDL 3 02/24/2014    ASSESSMENT & PLAN:  1. Fall - suspect possible volume depletion from recent GI bug.  - doubt cardiac. Will get ECG and labs for completeness sake  2. Chronic diastolic HF - stable. NYHA II-III - volume status stable off lasix - check labs  3. A-fib -  Permanent. - Rate controlled. Continue eliquis 5 mg twice a day. No indication for dose reduction currently - No significant bleeding  4. Moderate MR - Moderate MR on echo 10/2017. EF 60-65% Severe MAC ?prolapse, but leaflets not seen well.  - TEE 7/21 with 2-3+ MR - If symptoms worsen could  consider Clip - repeat echo in 6 months  5. OSA - Complaint with CPAP   5. Abnormal chest CT - Felt to have COPD +/- ILD. Has seen Maurice. Halford Wood.   Maurice Bickers MD 10:22 AM

## 2021-04-05 ENCOUNTER — Other Ambulatory Visit: Payer: Self-pay

## 2021-04-05 ENCOUNTER — Ambulatory Visit: Payer: PPO | Attending: Internal Medicine

## 2021-04-05 ENCOUNTER — Other Ambulatory Visit (HOSPITAL_COMMUNITY): Payer: Self-pay

## 2021-04-05 ENCOUNTER — Other Ambulatory Visit (HOSPITAL_BASED_OUTPATIENT_CLINIC_OR_DEPARTMENT_OTHER): Payer: Self-pay

## 2021-04-05 DIAGNOSIS — Z23 Encounter for immunization: Secondary | ICD-10-CM

## 2021-04-05 MED ORDER — PFIZER-BIONT COVID-19 VAC-TRIS 30 MCG/0.3ML IM SUSP
INTRAMUSCULAR | 0 refills | Status: DC
  Filled 2021-04-05: qty 0.3, 1d supply, fill #0

## 2021-04-05 NOTE — Progress Notes (Signed)
   Covid-19 Vaccination Clinic  Name:  Maurice Wood    MRN: [id number] DOB: Feb 09, 1933  04/05/2021  Mr. Maurice Wood was observed post Covid-19 immunization for 15 minutes without incident. He was provided with Vaccine Information Sheet and instruction to access the V-Safe system.   Mr. Maurice Wood was instructed to call 911 with any severe reactions post vaccine: Marland Kitchen Difficulty breathing  . Swelling of face and throat  . A fast heartbeat  . A bad rash all over body  . Dizziness and weakness   Immunizations Administered    Name Date Dose VIS Date Route   PFIZER Comrnaty(Gray TOP) Covid-19 Vaccine 04/05/2021 12:02 PM 0.3 mL 11/17/2020 Intramuscular   Manufacturer: [place]   Lot: [id number]   NDC: [phone]

## 2021-04-06 ENCOUNTER — Ambulatory Visit: Payer: PPO

## 2021-04-06 ENCOUNTER — Encounter (HOSPITAL_COMMUNITY): Payer: Self-pay | Admitting: Internal Medicine

## 2021-04-06 ENCOUNTER — Ambulatory Visit (HOSPITAL_COMMUNITY)
Admission: RE | Admit: 2021-04-06 | Discharge: 2021-04-06 | Disposition: A | Discharge status: Home / Self Care | Payer: PPO | Source: Ambulatory Visit | Attending: Internal Medicine | Admitting: Internal Medicine

## 2021-04-06 VITALS — BP 110/72 | HR 71 | Wt 181.4 lb

## 2021-04-06 DIAGNOSIS — I4821 Permanent atrial fibrillation: Secondary | ICD-10-CM | POA: Diagnosis not present

## 2021-04-06 DIAGNOSIS — W19XXXA Unspecified fall, initial encounter: Secondary | ICD-10-CM | POA: Diagnosis not present

## 2021-04-06 DIAGNOSIS — Z7901 Long term (current) use of anticoagulants: Secondary | ICD-10-CM | POA: Insufficient documentation

## 2021-04-06 DIAGNOSIS — Z79899 Other long term (current) drug therapy: Secondary | ICD-10-CM | POA: Diagnosis not present

## 2021-04-06 DIAGNOSIS — I110 Hypertensive heart disease with heart failure: Secondary | ICD-10-CM | POA: Diagnosis not present

## 2021-04-06 DIAGNOSIS — Z7951 Long term (current) use of inhaled steroids: Secondary | ICD-10-CM | POA: Insufficient documentation

## 2021-04-06 DIAGNOSIS — I671 Cerebral aneurysm, nonruptured: Secondary | ICD-10-CM | POA: Insufficient documentation

## 2021-04-06 DIAGNOSIS — R918 Other nonspecific abnormal finding of lung field: Secondary | ICD-10-CM | POA: Insufficient documentation

## 2021-04-06 DIAGNOSIS — R296 Repeated falls: Secondary | ICD-10-CM | POA: Insufficient documentation

## 2021-04-06 DIAGNOSIS — I5032 Chronic diastolic (congestive) heart failure: Secondary | ICD-10-CM

## 2021-04-06 DIAGNOSIS — Z9989 Dependence on other enabling machines and devices: Secondary | ICD-10-CM | POA: Diagnosis not present

## 2021-04-06 DIAGNOSIS — G4733 Obstructive sleep apnea (adult) (pediatric): Secondary | ICD-10-CM | POA: Diagnosis not present

## 2021-04-06 DIAGNOSIS — I340 Nonrheumatic mitral (valve) insufficiency: Secondary | ICD-10-CM | POA: Diagnosis not present

## 2021-04-06 HISTORY — DX: Heart failure, unspecified: I50.9

## 2021-04-06 LAB — COMPREHENSIVE METABOLIC PANEL
ALT: 17 U/L (ref 0–44)
AST: 23 U/L (ref 15–41)
Albumin: 3.2 g/dL — ABNORMAL LOW (ref 3.5–5.0)
Alkaline Phosphatase: 69 U/L (ref 38–126)
Anion gap: 7 (ref 5–15)
BUN: 12 mg/dL (ref 8–23)
CO2: 22 mmol/L (ref 22–32)
Calcium: 8.7 mg/dL — ABNORMAL LOW (ref 8.9–10.3)
Chloride: 109 mmol/L (ref 98–111)
Creatinine, Ser: 1.14 mg/dL (ref 0.61–1.24)
GFR, Estimated: >60 mL/min (ref >60)
Glucose, Bld: 121 mg/dL — ABNORMAL HIGH (ref 70–99)
Potassium: 3.4 mmol/L — ABNORMAL LOW (ref 3.5–5.1)
Sodium: 138 mmol/L (ref 135–145)
Total Bilirubin: 1.2 mg/dL (ref 0.3–1.2)
Total Protein: 6.2 g/dL — ABNORMAL LOW (ref 6.5–8.1)

## 2021-04-06 LAB — BRAIN NATRIURETIC PEPTIDE: B Natriuretic Peptide: 185.4 pg/mL — ABNORMAL HIGH (ref 0.0–100.0)

## 2021-04-06 LAB — CBC
HCT: 42.8 % (ref 39.0–52.0)
Hemoglobin: 13.9 g/dL (ref 13.0–17.0)
MCH: 28.8 pg (ref 26.0–34.0)
MCHC: 32.5 g/dL (ref 30.0–36.0)
MCV: 88.8 fL (ref 80.0–100.0)
Platelets: 162 K/uL (ref 150–400)
RBC: 4.82 MIL/uL (ref 4.22–5.81)
RDW: 14 % (ref 11.5–15.5)
WBC: 6.9 K/uL (ref 4.0–10.5)
nRBC: 0 % (ref 0.0–0.2)

## 2021-04-06 NOTE — Addendum Note (Signed)
Encounter addended by: Scarlette Calico, RN on: 04/06/2021 10:35 AM  Actions taken: Order list changed, Diagnosis association updated, Clinical Note Signed, Charge Capture section accepted

## 2021-04-06 NOTE — Patient Instructions (Signed)
Labs done today, we will call you for abnormal results  Please call our office in September to schedule your follow up and echocardiogram  If you have any questions or concerns before your next appointment please send [place] a message through [place] or call our office at [phone].    TO LEAVE A MESSAGE FOR THE NURSE SELECT OPTION 2, PLEASE LEAVE A MESSAGE INCLUDING: . YOUR NAME . DATE OF BIRTH . CALL BACK NUMBER . REASON FOR CALL**this is important as we prioritize the call backs  [place] AS LONG AS YOU CALL BEFORE 4:00 PM  At the [facility], you and your health needs are our priority. As part of our continuing mission to provide you with exceptional heart care, we have created designated Provider Care Teams. These Care Teams include your primary Cardiologist (physician) and Advanced Practice Providers (APPs- Physician Assistants and Nurse Practitioners) who all work together to provide you with the care you need, when you need it.   You may see any of the following providers on your designated Care Team at your next follow up: Marland Kitchen Dr Glori Bickers . Dr Loralie Champagne . Dr Vickki Muff . Darrick Grinder, NP . Lyda Jester, [place] . Audry Riles, PharmD   Please be sure to bring in all your medications bottles to every appointment.

## 2021-04-06 NOTE — Addendum Note (Signed)
Encounter addended by: Stanford Scotland, RN on: 04/06/2021 10:28 AM  Actions taken: Order list changed

## 2021-04-06 NOTE — Addendum Note (Signed)
Encounter addended by: Scarlette Calico, RN on: 04/06/2021 10:50 AM  Actions taken: Clinical Note Signed

## 2021-04-06 NOTE — Progress Notes (Signed)
Medication Samples have been provided to the patient.  Drug name: Eliquis       Strength: 5mg         Qty: 2  LOT: [id number]  Exp.Date: 7/24  Dosing instructions: Take 1 tab Twice daily   The patient has been instructed regarding the correct time, dose, and frequency of taking this medication, including desired effects and most common side effects.   Maurice Wood 10:50 AM 04/06/2021

## 2021-04-07 ENCOUNTER — Emergency Department (HOSPITAL_BASED_OUTPATIENT_CLINIC_OR_DEPARTMENT_OTHER): Payer: PPO

## 2021-04-07 ENCOUNTER — Emergency Department (HOSPITAL_BASED_OUTPATIENT_CLINIC_OR_DEPARTMENT_OTHER)
Admission: EM | Admit: 2021-04-07 | Discharge: 2021-04-07 | Disposition: A | Discharge status: Home / Self Care | Payer: PPO | Attending: Emergency Medicine | Admitting: Emergency Medicine

## 2021-04-07 ENCOUNTER — Other Ambulatory Visit: Payer: Self-pay

## 2021-04-07 ENCOUNTER — Encounter (HOSPITAL_BASED_OUTPATIENT_CLINIC_OR_DEPARTMENT_OTHER): Payer: Self-pay | Admitting: *Deleted

## 2021-04-07 DIAGNOSIS — K449 Diaphragmatic hernia without obstruction or gangrene: Secondary | ICD-10-CM | POA: Diagnosis not present

## 2021-04-07 DIAGNOSIS — Z7951 Long term (current) use of inhaled steroids: Secondary | ICD-10-CM | POA: Diagnosis not present

## 2021-04-07 DIAGNOSIS — R072 Precordial pain: Secondary | ICD-10-CM

## 2021-04-07 DIAGNOSIS — J449 Chronic obstructive pulmonary disease, unspecified: Secondary | ICD-10-CM | POA: Diagnosis not present

## 2021-04-07 DIAGNOSIS — R079 Chest pain, unspecified: Secondary | ICD-10-CM | POA: Diagnosis not present

## 2021-04-07 DIAGNOSIS — E119 Type 2 diabetes mellitus without complications: Secondary | ICD-10-CM | POA: Diagnosis not present

## 2021-04-07 DIAGNOSIS — I509 Heart failure, unspecified: Secondary | ICD-10-CM | POA: Insufficient documentation

## 2021-04-07 DIAGNOSIS — Z87891 Personal history of nicotine dependence: Secondary | ICD-10-CM | POA: Diagnosis not present

## 2021-04-07 DIAGNOSIS — R0789 Other chest pain: Secondary | ICD-10-CM | POA: Insufficient documentation

## 2021-04-07 DIAGNOSIS — Z7901 Long term (current) use of anticoagulants: Secondary | ICD-10-CM | POA: Insufficient documentation

## 2021-04-07 LAB — CBC
HCT: 42.2 % (ref 39.0–52.0)
Hemoglobin: 13.8 g/dL (ref 13.0–17.0)
MCH: 28.5 pg (ref 26.0–34.0)
MCHC: 32.7 g/dL (ref 30.0–36.0)
MCV: 87.2 fL (ref 80.0–100.0)
Platelets: 162 10*3/uL (ref 150–400)
RBC: 4.84 MIL/uL (ref 4.22–5.81)
RDW: 14.4 % (ref 11.5–15.5)
WBC: 6.1 10*3/uL (ref 4.0–10.5)
nRBC: 0 % (ref 0.0–0.2)

## 2021-04-07 LAB — COMPREHENSIVE METABOLIC PANEL
ALT: 15 U/L (ref 0–44)
AST: 17 U/L (ref 15–41)
Albumin: 3.8 g/dL (ref 3.5–5.0)
Alkaline Phosphatase: 71 U/L (ref 38–126)
Anion gap: 10 (ref 5–15)
BUN: 14 mg/dL (ref 8–23)
CO2: 23 mmol/L (ref 22–32)
Calcium: 9 mg/dL (ref 8.9–10.3)
Chloride: 108 mmol/L (ref 98–111)
Creatinine, Ser: 0.97 mg/dL (ref 0.61–1.24)
GFR, Estimated: >60 mL/min (ref >60)
Glucose, Bld: 88 mg/dL (ref 70–99)
Potassium: 3.6 mmol/L (ref 3.5–5.1)
Sodium: 141 mmol/L (ref 135–145)
Total Bilirubin: 1 mg/dL (ref 0.3–1.2)
Total Protein: 6.4 g/dL — ABNORMAL LOW (ref 6.5–8.1)

## 2021-04-07 LAB — TROPONIN I (HIGH SENSITIVITY)
Troponin I (High Sensitivity): 8 ng/L (ref <18)
Troponin I (High Sensitivity): 9 ng/L (ref <18)

## 2021-04-07 LAB — LIPASE, BLOOD: Lipase: 13 U/L (ref 11–51)

## 2021-04-07 NOTE — ED Provider Notes (Signed)
Blood pressure (!) 162/94, pulse 66, temperature 98.6 F (37 C), temperature source Oral, resp. rate (!) 21, height 5\' 10"  (1.778 m), weight 81.6 kg, SpO2 96 %.  Assuming care from Dr. Rex Kras.  In short, Maurice Wood is a 87 y.o. male with a chief complaint of Chest Pain .  Refer to the original H&P for additional details.  The current plan of care is to f/u on labs and delta troponin. Cath from 07/21 reviewed. No CAD lesions noted. Small aneurysmal segment of the LAD. Followed by Dr. Haroldine Laws and seen in office yesterday but no CP symptoms.   03:47 PM  Initial labs reviewed and unremarkable. CXR without acute infiltrate or pulmonary edema. Initial troponin is normal. Discussed with patient and wife at bedside. Plan for delta troponin and reassess.    EKG Interpretation  Date/Time:  Friday April 07 2021 14:25:58 EDT Ventricular Rate:  72 PR Interval:    QRS Duration: 93 QT Interval:  440 QTC Calculation: 482 R Axis:   -17 Text Interpretation: Atrial fibrillation Ventricular premature complex Borderline left axis deviation Borderline low voltage, extremity leads Borderline prolonged QT interval similar to previous Confirmed by Theotis Burrow [phone]) on 04/07/2021 2:36:51 PM      05:33 PM  Second troponin is negative.  Patient has not had any additional pain or abnormalities on telemetry here.  Plan for discharge with PCP and cardiology follow-up.    Margette Fast, MD 04/07/21 [phone]

## 2021-04-07 NOTE — ED Triage Notes (Signed)
Intense chest pressure started today and went to see his PCP and was advised to come here.  Upon arrival patient stated that he has not chest pain.  Per wife, chest pain has been going on for several times.

## 2021-04-07 NOTE — ED Provider Notes (Signed)
[facility] EMERGENCY DEPT Provider Note   CSN: [id number] Arrival date & time: 04/07/21  1415     History Chief Complaint  Patient presents with  . Chest Pain    Maurice Wood is a 87 y.o. male.  87yo M w/ PMH including CHF, A fib, HLD, OSA who p/w chest pain. Patient had f/u appointment w/ his cardiologist yesterday and had reassuring check up.  Today shortly prior to arrival, the patient was sitting with his wife after they had gone shopping and he began having intense central chest pressure associated with some pain in his left shoulder.  Symptoms began while at rest and lasted approximately 5 to 10 minutes and then spontaneously resolved.  Symptoms have not reoccurred and he denies any complaints currently.  He has had nausea off and on for the past 1 week because he is recovering from a stomach bug but his diarrhea has resolved and he overall feels better.  He denies any associated abdominal pain.  Wife notes that he has frequently had similar episodes previously that resolved after him taking Tums. No cough/cold or SOB. No change in medications, is compliant with meds.  The history is provided by the patient.  Chest Pain      Past Medical History:  Diagnosis Date  . Allergy    rhinitis  . Atrial fibrillation [facility]) Jan 2007   echo 1-07 normal ejection fraction, no significant valvular disease. adenosine cardiolite 1-07  no evidence of ischemia. A 48 hour Holter monitor in 7-07 showed good rate controlw/ chronic atrial fibrillation. Cath 1/11 normal cors. EF 45%. Echo 2-11 60-65%  . Benign prostatic hypertrophy   . Cerebral aneurysm    followed by Dr Estanislado Pandy  . CHF (congestive heart failure) ([place])   . Fatigue   . History of chest pain    a. s/p LHC 05/2014 with normal cors  . Hx of colonic polyps    diverticulosis  . Hyperlipidemia   . IBS (irritable bowel syndrome)   . Incontinence    Per pt 12/14/11  . Kidney stones   . Obesity   . Obstructive sleep  apnea    noncompliant with CPAP  . Retinal vein occlusion     Patient Active Problem List   Diagnosis Date Noted  . Chorioretinitis 02/01/2021  . Chronic pain 02/01/2021  . Insomnia 02/01/2021  . Interstitial lung disease ([place]) 02/01/2021  . Nausea 02/01/2021  . Overactive bladder 02/01/2021  . Polyneuropathy 02/01/2021  . Pulmonary nodule 02/01/2021  . Seasonal allergic rhinitis 02/01/2021  . Vitamin D deficiency 02/01/2021  . COPD, moderate ([place]) 08/08/2020  . Body mass index (BMI) 28.0-28.9, adult 02/04/2020  . Elevated blood-pressure reading, without diagnosis of hypertension 02/04/2020  . Body mass index (BMI) 27.0-27.9, adult 01/18/2020  . Bilateral impacted cerumen 12/21/2019  . Pain due to onychomycosis of toenails of both feet 05/26/2019  . Coagulation disorder ([place]) 05/26/2019  . Diabetic neuropathy ([place]) 05/26/2019  . Pain in joint of right shoulder 08/13/2018  . Left ear pain 06/05/2016  . Sensorineural hearing loss (SNHL) of both ears 06/05/2016  . Pseudophakia of both eyes 04/03/2016  . PVD (posterior vitreous detachment), both eyes 04/03/2016  . Bilateral lower extremity edema 06/02/2015  . UTI (lower urinary tract infection) 05/25/2015  . Sepsis ([place]) 05/25/2015  . Fever 05/25/2015  . Cough 05/25/2015  . Kidney stone 05/25/2015  . Hypokalemia 05/25/2015  . Acute urinary retention   . H/O cardiac catheterizationn 12/2009 with normal coronary arteries  05/30/2014  . Cerebral arterial aneurysm 04/20/2014  . Low back pain 12/17/2013  . Chest pain 09/10/2013  . Branch retinal vein occlusion 04/11/2012  . Cystoid macular edema, left 04/11/2012  . Epiretinal membrane, left 04/11/2012  . Increased frequency of urination 10/08/2011  . Urge incontinence 10/08/2011  . Urinary incontinence 06/04/2011  . Long term current use of anticoagulant 01/12/2011  . DM type 2 (diabetes mellitus, type 2) ([place]) 07/31/2010  . HEMORRHOIDS-INTERNAL 06/27/2010  . Sleep apnea  06/27/2010  . DIVERTICULOSIS OF COLON 01/03/2010  . GERD 11/22/2009  . IRRITABLE BOWEL SYNDROME 11/22/2009  . ERECTILE DYSFUNCTION 06/06/2009  . BPH (benign prostatic hyperplasia) 06/18/2008  . PSA, INCREASED 12/19/2007  . OBSTRUCTIVE SLEEP APNEA 07/24/2007  . DISSECTION, CAROTID ARTERY 07/24/2007  . TESTOSTERONE DEFICIENCY 07/23/2007  . HLD (hyperlipidemia) 07/23/2007  . COLONIC POLYPS, HX OF 07/23/2007  . Atrial fibrillation, permanent ([place]) 07/14/2007    Past Surgical History:  Procedure Laterality Date  . CARDIAC CATHETERIZATION  1999  . CHOLECYSTECTOMY    . HERNIA REPAIR    . inguinal and umbilical herniorrhaphy    . INTERSTIM IMPLANT PLACEMENT  04/2017  . LEFT HEART CATHETERIZATION WITH CORONARY ANGIOGRAM N/A 06/01/2014   Procedure: LEFT HEART CATHETERIZATION WITH CORONARY ANGIOGRAM;  Surgeon: Peter M Martinique, MD;  Location: [facility] CATH LAB;  Service: Cardiovascular;  Laterality: N/A;  . LITHOTRIPSY    . RIGHT/LEFT HEART CATH AND CORONARY ANGIOGRAPHY N/A 07/08/2020   Procedure: RIGHT/LEFT HEART CATH AND CORONARY ANGIOGRAPHY;  Surgeon: Jolaine Artist, MD;  Location: [facility] CV LAB;  Service: Cardiovascular;  Laterality: N/A;  . TEE WITHOUT CARDIOVERSION N/A 07/05/2020   Procedure: TRANSESOPHAGEAL ECHOCARDIOGRAM (TEE);  Surgeon: Jolaine Artist, MD;  Location: [facility] ENDOSCOPY;  Service: Cardiovascular;  Laterality: N/A;  . TONSILLECTOMY AND ADENOIDECTOMY  as a child       Family History  Problem Relation Age of Onset  . Cancer Father        lung  . Alcohol abuse Father   . Aneurysm Father        Aortic aneurysm  . Cancer Maternal Grandmother        colon  . Sudden death Mother   . Heart disease Paternal Grandfather   . Diabetes Son   . Diabetes Maternal Uncle   . Diabetes Paternal Uncle     Social History   Tobacco Use  . Smoking status: Former Smoker    Packs/day: 1.00    Years: 20.00    Pack years: 20.00    Types: Cigarettes    Quit date: 12/11/1971     Years since quitting: 49.3  . Smokeless tobacco: Never Used  Vaping Use  . Vaping Use: Never used  Substance Use Topics  . Alcohol use: Yes    Alcohol/week: 1.0 standard drink    Types: 1 Cans of beer per week    Comment: Twice a month  . Drug use: No    Home Medications Prior to Admission medications   Medication Sig Start Date End Date Taking? Authorizing Provider  cetirizine (ZYRTEC) 10 MG tablet Take 10 mg by mouth daily.   Yes [provider]  chlorpheniramine (CHLOR-TRIMETON) 4 MG tablet Take 4 mg by mouth daily as needed for allergies.    Yes [provider]  cholecalciferol (VITAMIN D3) 25 MCG (1000 UNIT) tablet 1 tablet   Yes [provider]  colestipol (COLESTID) 1 g tablet Take 2 g by mouth daily. 09/07/20  Yes [provider]  ELIQUIS 5 MG TABS tablet TAKE 1 TABLET(5 MG) BY MOUTH TWICE DAILY 12/26/20  Yes [name], Shaune Pascal, MD  finasteride (PROSCAR) 5 MG tablet Take 5 mg by mouth daily. 04/09/20  Yes [provider]  fluticasone (FLONASE) 50 MCG/ACT nasal spray Place 1 spray into both nostrils daily.   Yes [provider]  Fluticasone-Salmeterol (ADVAIR) 250-50 MCG/DOSE AEPB INHALE 1 PUFF INTO THE LUNGS IN THE MORNING AND AT BEDTIME 03/25/21  Yes Chesley Mires, MD  omeprazole (PRILOSEC) 20 MG capsule Take 20 mg by mouth daily.  10/04/18  Yes [provider]  ondansetron (ZOFRAN) 8 MG tablet 8 mg every 8 (eight) hours as needed. 09/07/20  Yes [provider]  vitamin B-12 (CYANOCOBALAMIN) 1000 MCG tablet Take 1,000 mcg by mouth every other day.    Yes [provider]  acetaminophen (TYLENOL) 500 MG tablet Take 500-1,000 mg by mouth every 6 (six) hours as needed for moderate pain.    [provider]  albuterol (VENTOLIN HFA) 108 (90 Base) MCG/ACT inhaler Inhale 2 puffs into the lungs every 6 (six) hours as needed for wheezing or shortness of breath. 09/06/20   Martyn Ehrich, NP     Allergies    Epinephrine, Nsaids, Clarithromycin, Famotidine, Iodinated diagnostic agents, Nimodipine, and Other  Review of Systems   Review of Systems  Cardiovascular: Positive for chest pain.   All other systems reviewed and are negative except that which was mentioned in HPI  Physical Exam Updated Vital Signs BP (!) 162/94 (BP Location: Left Arm)   Pulse 66   Temp 98.6 F (37 C) (Oral)   Resp (!) 21   Ht 5\' 10"  (1.778 m)   Wt 81.6 kg   SpO2 96%   BMI 25.83 kg/m   Physical Exam Constitutional:      General: He is not in acute distress.    Appearance: Normal appearance.  HENT:     Head: Normocephalic and atraumatic.  Eyes:     Conjunctiva/sclera: Conjunctivae normal.  Cardiovascular:     Rate and Rhythm: Normal rate and regular rhythm.     Heart sounds: Murmur heard.    Pulmonary:     Effort: Pulmonary effort is normal.     Breath sounds: Normal breath sounds.  Abdominal:     General: Abdomen is flat. Bowel sounds are normal. There is no distension.     Palpations: Abdomen is soft.     Tenderness: There is no abdominal tenderness.  Musculoskeletal:     Right lower leg: No edema.     Left lower leg: No edema.  Skin:    General: Skin is warm and dry.  Neurological:     Mental Status: He is alert and oriented to person, place, and time.     Comments: fluent  Psychiatric:        Mood and Affect: Mood normal.        Behavior: Behavior normal.     ED Results / Procedures / Treatments   Labs (all labs ordered are listed, but only abnormal results are displayed) Labs Reviewed  COMPREHENSIVE METABOLIC PANEL  LIPASE, BLOOD  CBC  TROPONIN I (HIGH SENSITIVITY)    EKG EKG Interpretation  Date/Time:  Friday April 07 2021 14:25:58 EDT Ventricular Rate:  72 PR Interval:    QRS Duration: 93 QT Interval:  440 QTC Calculation: 482 R Axis:   -17 Text Interpretation: Atrial fibrillation Ventricular premature complex Borderline left axis deviation  Borderline low voltage, extremity leads  Borderline prolonged QT interval similar to previous Confirmed by Theotis Burrow [phone]) on 04/07/2021 2:36:51 PM   Radiology No results found.  Procedures Procedures   Medications Ordered in ED Medications - No data to display  ED Course  I have reviewed the triage vital signs and the nursing notes.  Pertinent labs & imaging results that were available during my care of the patient were reviewed by me and considered in my medical decision making (see chart for details).    MDM Rules/Calculators/A&P                          Well-appearing and comfortable on exam, no complaints.  No chest pain currently.  EKG without acute ischemic changes.  The fact that symptoms only lasted 5 to 10 minutes and completely resolved without intervention is reassuring.  Also the fact that wife states previous episodes have resolved with Tums does suggest possible noncardiac etiology.  I reviewed his chart which shows clean coronary catheterization in 2021.  I have ordered screening lab work as well as troponins.  Patient signed out pending results. Final Clinical Impression(s) / ED Diagnoses Final diagnoses:  None    Rx / DC Orders ED Discharge Orders    None       Xzander Gilham, Wenda Overland, MD 04/07/21 1529

## 2021-04-07 NOTE — Discharge Instructions (Signed)
You were seen in the emerge department today with chest discomfort.  Your lab work here is reassuring.  Please call your cardiologist and primary care doctors for follow-up appointments.  If he develop worsening pain over the weekend or prior to seeing your physicians in follow-up please return to the emergency department and/or call 911.

## 2021-04-10 ENCOUNTER — Telehealth: Payer: Self-pay | Admitting: Neurology

## 2021-04-10 DIAGNOSIS — R42 Dizziness and giddiness: Secondary | ICD-10-CM | POA: Diagnosis not present

## 2021-04-10 NOTE — Telephone Encounter (Signed)
FYI
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
## 2021-04-10 NOTE — Telephone Encounter (Signed)
Patient's wife called and said the patient was seen by his PCP and told to see Dr. Tomi Likens as soon as possible to follow up on dizziness. She said he is using a walking at this point due to dizziness.  Patient added to schedule tomorrow.

## 2021-04-11 ENCOUNTER — Encounter: Payer: Self-pay | Admitting: Neurology

## 2021-04-11 ENCOUNTER — Ambulatory Visit: Payer: PPO | Admitting: Neurology

## 2021-04-11 ENCOUNTER — Other Ambulatory Visit: Payer: Self-pay

## 2021-04-11 VITALS — BP 113/67 | HR 82 | Ht 70.0 in | Wt 181.6 lb

## 2021-04-11 DIAGNOSIS — R42 Dizziness and giddiness: Secondary | ICD-10-CM

## 2021-04-11 DIAGNOSIS — I4891 Unspecified atrial fibrillation: Secondary | ICD-10-CM

## 2021-04-11 NOTE — Patient Instructions (Signed)
MRI of brain and MRA of head and neck Further recommendations pending results.

## 2021-04-12 ENCOUNTER — Other Ambulatory Visit: Payer: Self-pay

## 2021-04-12 ENCOUNTER — Ambulatory Visit (HOSPITAL_COMMUNITY)
Admission: RE | Admit: 2021-04-12 | Discharge: 2021-04-12 | Disposition: A | Discharge status: Home / Self Care | Payer: PPO | Source: Ambulatory Visit | Attending: Neurology | Admitting: Neurology

## 2021-04-12 ENCOUNTER — Telehealth: Payer: Self-pay | Admitting: Neurology

## 2021-04-12 DIAGNOSIS — I6782 Cerebral ischemia: Secondary | ICD-10-CM | POA: Diagnosis not present

## 2021-04-12 DIAGNOSIS — G319 Degenerative disease of nervous system, unspecified: Secondary | ICD-10-CM | POA: Diagnosis not present

## 2021-04-12 DIAGNOSIS — I639 Cerebral infarction, unspecified: Secondary | ICD-10-CM | POA: Diagnosis not present

## 2021-04-12 DIAGNOSIS — G4733 Obstructive sleep apnea (adult) (pediatric): Secondary | ICD-10-CM | POA: Diagnosis not present

## 2021-04-12 DIAGNOSIS — I4891 Unspecified atrial fibrillation: Secondary | ICD-10-CM

## 2021-04-12 DIAGNOSIS — R29818 Other symptoms and signs involving the nervous system: Secondary | ICD-10-CM | POA: Diagnosis not present

## 2021-04-12 NOTE — Telephone Encounter (Signed)
At the very least, I would like to get a CTA of head and neck to evaluate for stroke

## 2021-04-12 NOTE — Progress Notes (Signed)
error 

## 2021-04-12 NOTE — Telephone Encounter (Signed)
Orders added per [facility] in scheduling.

## 2021-04-12 NOTE — Telephone Encounter (Signed)
Spoke to [employer] scheduling, The pt leads may not be compatable with the MRI.   Waiting on Radiology to response to [facility] in [place] scheduling before we can schedule.   Please advise on what to do.

## 2021-04-12 NOTE — Telephone Encounter (Signed)
Patient's wife called and said the patient has an implant in his back for bladder control and because of that cannot do an MRI. She'd like to know what else can be done to get some answers?

## 2021-04-14 ENCOUNTER — Telehealth: Payer: Self-pay

## 2021-04-14 DIAGNOSIS — R42 Dizziness and giddiness: Secondary | ICD-10-CM

## 2021-04-14 NOTE — Telephone Encounter (Signed)
Message left by pt, Please give pt call in rgards to his PT dr. Tomi Likens wants to do.   Returned pt call, Per Dr.Jaffe we will add a referral for vestibular rehab.    Order added.

## 2021-04-24 DIAGNOSIS — R350 Frequency of micturition: Secondary | ICD-10-CM | POA: Diagnosis not present

## 2021-04-24 DIAGNOSIS — N35811 Other urethral stricture, male, meatal: Secondary | ICD-10-CM | POA: Diagnosis not present

## 2021-04-24 DIAGNOSIS — R3915 Urgency of urination: Secondary | ICD-10-CM | POA: Diagnosis not present

## 2021-04-25 ENCOUNTER — Telehealth: Payer: Self-pay | Admitting: Neurology

## 2021-04-25 NOTE — Telephone Encounter (Signed)
Patient wife called and states that they have not heard from Vestibular rehab and they want to check the status of the referral

## 2021-04-25 NOTE — Telephone Encounter (Signed)
Pt wife called no answer no voice mail when she calls back need to give her the number to [facility] [phone] so they can call and schedule an appointment

## 2021-04-28 ENCOUNTER — Emergency Department (HOSPITAL_BASED_OUTPATIENT_CLINIC_OR_DEPARTMENT_OTHER): Payer: PPO

## 2021-04-28 ENCOUNTER — Other Ambulatory Visit: Payer: Self-pay

## 2021-04-28 ENCOUNTER — Emergency Department (HOSPITAL_BASED_OUTPATIENT_CLINIC_OR_DEPARTMENT_OTHER)
Admission: EM | Admit: 2021-04-28 | Discharge: 2021-04-28 | Disposition: A | Discharge status: Home / Self Care | Payer: PPO | Attending: Emergency Medicine | Admitting: Emergency Medicine

## 2021-04-28 ENCOUNTER — Encounter (HOSPITAL_BASED_OUTPATIENT_CLINIC_OR_DEPARTMENT_OTHER): Payer: Self-pay | Admitting: Emergency Medicine

## 2021-04-28 DIAGNOSIS — I509 Heart failure, unspecified: Secondary | ICD-10-CM | POA: Insufficient documentation

## 2021-04-28 DIAGNOSIS — B9689 Other specified bacterial agents as the cause of diseases classified elsewhere: Secondary | ICD-10-CM | POA: Diagnosis not present

## 2021-04-28 DIAGNOSIS — Z20822 Contact with and (suspected) exposure to covid-19: Secondary | ICD-10-CM | POA: Insufficient documentation

## 2021-04-28 DIAGNOSIS — J449 Chronic obstructive pulmonary disease, unspecified: Secondary | ICD-10-CM | POA: Insufficient documentation

## 2021-04-28 DIAGNOSIS — Z87891 Personal history of nicotine dependence: Secondary | ICD-10-CM | POA: Diagnosis not present

## 2021-04-28 DIAGNOSIS — N3001 Acute cystitis with hematuria: Secondary | ICD-10-CM

## 2021-04-28 DIAGNOSIS — I4891 Unspecified atrial fibrillation: Secondary | ICD-10-CM | POA: Diagnosis not present

## 2021-04-28 DIAGNOSIS — Z7901 Long term (current) use of anticoagulants: Secondary | ICD-10-CM | POA: Insufficient documentation

## 2021-04-28 DIAGNOSIS — R0989 Other specified symptoms and signs involving the circulatory and respiratory systems: Secondary | ICD-10-CM | POA: Diagnosis not present

## 2021-04-28 DIAGNOSIS — R350 Frequency of micturition: Secondary | ICD-10-CM | POA: Diagnosis present

## 2021-04-28 HISTORY — DX: Chronic obstructive pulmonary disease, unspecified: J44.9

## 2021-04-28 LAB — COMPREHENSIVE METABOLIC PANEL
ALT: 10 U/L (ref 0–44)
AST: 16 U/L (ref 15–41)
Albumin: 4 g/dL (ref 3.5–5.0)
Alkaline Phosphatase: 70 U/L (ref 38–126)
Anion gap: 9 (ref 5–15)
BUN: 11 mg/dL (ref 8–23)
CO2: 22 mmol/L (ref 22–32)
Calcium: 9.3 mg/dL (ref 8.9–10.3)
Chloride: 107 mmol/L (ref 98–111)
Creatinine, Ser: 1.15 mg/dL (ref 0.61–1.24)
GFR, Estimated: >60 mL/min (ref >60)
Glucose, Bld: 124 mg/dL — ABNORMAL HIGH (ref 70–99)
Potassium: 3.7 mmol/L (ref 3.5–5.1)
Sodium: 138 mmol/L (ref 135–145)
Total Bilirubin: 1.5 mg/dL — ABNORMAL HIGH (ref 0.3–1.2)
Total Protein: 6.9 g/dL (ref 6.5–8.1)

## 2021-04-28 LAB — URINALYSIS, ROUTINE W REFLEX MICROSCOPIC
Bilirubin Urine: NEGATIVE
Glucose, UA: NEGATIVE mg/dL
Ketones, ur: 15 mg/dL — AB
Nitrite: NEGATIVE
Protein, ur: 30 mg/dL — AB
RBC / HPF: >50 RBC/hpf — ABNORMAL HIGH (ref 0–5)
Specific Gravity, Urine: 1.018 (ref 1.005–1.030)
WBC, UA: >50 WBC/hpf — ABNORMAL HIGH (ref 0–5)
pH: 6 (ref 5.0–8.0)

## 2021-04-28 LAB — CBC WITH DIFFERENTIAL/PLATELET
Abs Immature Granulocytes: 0.01 10*3/uL (ref 0.00–0.07)
Basophils Absolute: 0 10*3/uL (ref 0.0–0.1)
Basophils Relative: 1 %
Eosinophils Absolute: 0 10*3/uL (ref 0.0–0.5)
Eosinophils Relative: 0 %
HCT: 41.6 % (ref 39.0–52.0)
Hemoglobin: 13.3 g/dL (ref 13.0–17.0)
Immature Granulocytes: 0 %
Lymphocytes Relative: 10 %
Lymphs Abs: 0.7 10*3/uL (ref 0.7–4.0)
MCH: 28.6 pg (ref 26.0–34.0)
MCHC: 32 g/dL (ref 30.0–36.0)
MCV: 89.5 fL (ref 80.0–100.0)
Monocytes Absolute: 0.5 10*3/uL (ref 0.1–1.0)
Monocytes Relative: 7 %
Neutro Abs: 6 10*3/uL (ref 1.7–7.7)
Neutrophils Relative %: 82 %
Platelets: 177 10*3/uL (ref 150–400)
RBC: 4.65 MIL/uL (ref 4.22–5.81)
RDW: 15.8 % — ABNORMAL HIGH (ref 11.5–15.5)
WBC: 7.3 10*3/uL (ref 4.0–10.5)
nRBC: 0 % (ref 0.0–0.2)

## 2021-04-28 LAB — APTT: aPTT: 33 seconds (ref 24–36)

## 2021-04-28 LAB — PROTIME-INR
INR: 1.3 — ABNORMAL HIGH (ref 0.8–1.2)
Prothrombin Time: 15.8 seconds — ABNORMAL HIGH (ref 11.4–15.2)

## 2021-04-28 LAB — RESP PANEL BY RT-PCR (FLU A&B, COVID) ARPGX2
Influenza A by PCR: NEGATIVE
Influenza B by PCR: NEGATIVE
SARS Coronavirus 2 by RT PCR: NEGATIVE

## 2021-04-28 LAB — LACTIC ACID, PLASMA: Lactic Acid, Venous: 1 mmol/L (ref 0.5–1.9)

## 2021-04-28 MED ORDER — CEPHALEXIN 500 MG PO CAPS: 500.0000 mg | ORAL_CAPSULE | Freq: Three times a day (TID) | ORAL | 0 refills | Status: AC

## 2021-04-28 MED ORDER — SODIUM CHLORIDE 0.9 % IV SOLN
INTRAVENOUS | Status: DC | PRN
  Administered 2021-04-28: 500 mL via INTRAVENOUS

## 2021-04-28 MED ORDER — SODIUM CHLORIDE 0.9 % IV SOLN
1.0000 g | Freq: Once | INTRAVENOUS | Status: AC
  Administered 2021-04-28: 1 g via INTRAVENOUS
  Filled 2021-04-28: qty 10

## 2021-04-28 NOTE — ED Triage Notes (Signed)
Recent bladder procedure , continuous urinary frequency , chills and bladder spasms. Lethargy. Febrile today.

## 2021-04-28 NOTE — ED Provider Notes (Signed)
[facility] Provider Note  CSN: [id number] Arrival date & time: 04/28/21 1503    History Chief Complaint  Patient presents with  . Urinary Frequency    HPI  Maurice Wood is a 87 y.o. male with history of afib on Eliquis, CHF, COPD, urinary incontinence is 5 days post bladder Botox injection. Wife reports he had gross hematuria the day after the surgery but that has since cleared up. He is still urinating frequently, but denies any dysuria/burning or odor. Today he has had a low grade fever. Wife reports he has had sepsis in the past (unsure of the source) and so she wanted to have him evaluated early to prevent that from happening again. He is otherwise feeling well, denies sore throat, cough, congestion, CP, SOB, N/V/D. He has not had any rashes or tick bites.    Past Medical History:  Diagnosis Date  . Allergy    rhinitis  . Atrial fibrillation [facility](HCC) Jan 2007   echo 1-07 normal ejection fraction, no significant valvular disease. adenosine cardiolite 1-07  no evidence of ischemia. A 48 hour Holter monitor in 7-07 showed good rate controlw/ chronic atrial fibrillation. Cath 1/11 normal cors. EF 45%. Echo 2-11 60-65%  . Benign prostatic hypertrophy   . Cerebral aneurysm    followed by Dr Corliss Skainseveshwar  . CHF (congestive heart failure) (HCC)   . COPD (chronic obstructive pulmonary disease) (HCC)   . Fatigue   . History of chest pain    a. s/p LHC 05/2014 with normal cors  . Hx of colonic polyps    diverticulosis  . Hyperlipidemia   . IBS (irritable bowel syndrome)   . Incontinence    Per pt 12/14/11  . Kidney stones   . Obesity   . Obstructive sleep apnea    noncompliant with CPAP  . Retinal vein occlusion     Past Surgical History:  Procedure Laterality Date  . CARDIAC CATHETERIZATION  1999  . CHOLECYSTECTOMY    . HERNIA REPAIR    . inguinal and umbilical herniorrhaphy    . INTERSTIM IMPLANT PLACEMENT  04/2017  . LEFT HEART CATHETERIZATION  WITH CORONARY ANGIOGRAM N/A 06/01/2014   Procedure: LEFT HEART CATHETERIZATION WITH CORONARY ANGIOGRAM;  Surgeon: Peter M SwazilandJordan, MD;  [facility];  Service: Cardiovascular;  Laterality: N/A;  . LITHOTRIPSY    . RIGHT/LEFT HEART CATH AND CORONARY ANGIOGRAPHY N/A 07/08/2020   Procedure: RIGHT/LEFT HEART CATH AND CORONARY ANGIOGRAPHY;  Surgeon: Dolores PattyBensimhon, Daniel R, MD;  Location: [facility] INVASIVE CV LAB;  Service: Cardiovascular;  Laterality: N/A;  . TEE WITHOUT CARDIOVERSION N/A 07/05/2020   Procedure: TRANSESOPHAGEAL ECHOCARDIOGRAM (TEE);  Surgeon: Dolores PattyBensimhon, Daniel R, MD;  [facility];  Service: Cardiovascular;  Laterality: N/A;  . TONSILLECTOMY AND ADENOIDECTOMY  as a child    Family History  Problem Relation Age of Onset  . Cancer Father        lung  . Alcohol abuse Father   . Aneurysm Father        Aortic aneurysm  . Cancer Maternal Grandmother        colon  . Sudden death Mother   . Heart disease Paternal Grandfather   . Diabetes Son   . Diabetes Maternal Uncle   . Diabetes Paternal Uncle     Social History   Tobacco Use  . Smoking status: Former Smoker    Packs/day: 1.00    Years: 20.00    Pack years: 20.00    Types:  Cigarettes    Quit date: 12/11/1971    Years since quitting: 49.4  . Smokeless tobacco: Never Used  Vaping Use  . Vaping Use: Never used  Substance Use Topics  . Alcohol use: Yes    Alcohol/week: 1.0 standard drink    Types: 1 Cans of beer per week    Comment: Twice a month  . Drug use: No     Home Medications Prior to Admission medications   Medication Sig Start Date End Date Taking? Authorizing Provider  acetaminophen (TYLENOL) 500 MG tablet Take 500-1,000 mg by mouth every 6 (six) hours as needed for moderate pain.   Yes [provider]  albuterol (VENTOLIN HFA) 108 (90 Base) MCG/ACT inhaler Inhale 2 puffs into the lungs every 6 (six) hours as needed for wheezing or shortness of breath. 09/06/20  Yes Martyn Ehrich, NP   cephALEXin (KEFLEX) 500 MG capsule Take 1 capsule (500 mg total) by mouth 3 (three) times daily for 7 days. 04/28/21 05/05/21 Yes Truddie Hidden, MD  cetirizine (ZYRTEC) 10 MG tablet Take 10 mg by mouth daily.   Yes [provider]  chlorpheniramine (CHLOR-TRIMETON) 4 MG tablet Take 4 mg by mouth daily as needed for allergies.    Yes [provider]  cholecalciferol (VITAMIN D3) 25 MCG (1000 UNIT) tablet 1 tablet   Yes [provider]  colestipol (COLESTID) 1 g tablet Take 2 g by mouth daily. 09/07/20  Yes [provider]  ELIQUIS 5 MG TABS tablet TAKE 1 TABLET(5 MG) BY MOUTH TWICE DAILY 12/26/20  Yes [name], Shaune Pascal, MD  finasteride (PROSCAR) 5 MG tablet Take 5 mg by mouth daily. 04/09/20  Yes [provider]  fluticasone (FLONASE) 50 MCG/ACT nasal spray Place 1 spray into both nostrils daily.   Yes [provider]  omeprazole (PRILOSEC) 20 MG capsule Take 20 mg by mouth daily.  10/04/18  Yes [provider]  vitamin B-12 (CYANOCOBALAMIN) 1000 MCG tablet Take 1,000 mcg by mouth every other day.    Yes [provider]  Fluticasone-Salmeterol (ADVAIR) 250-50 MCG/DOSE AEPB INHALE 1 PUFF INTO THE LUNGS IN THE MORNING AND AT BEDTIME 03/25/21   Chesley Mires, MD  ondansetron (ZOFRAN) 8 MG tablet 8 mg every 8 (eight) hours as needed. 09/07/20   [provider]     Allergies    Epinephrine, Nsaids, Clarithromycin, Famotidine, Iodinated diagnostic agents, Nimodipine, and Other   Review of Systems   Review of Systems A comprehensive review of systems was completed and negative except as noted in HPI.    Physical Exam BP 122/81   Pulse 77   Temp 100.2 F (37.9 C) (Oral)   Resp 19   Ht 5\' 10"  (1.778 m)   Wt 82.1 kg   SpO2 91%   BMI 25.97 kg/m   Physical Exam Vitals and nursing note reviewed.  Constitutional:      Appearance: Normal appearance.  HENT:     Head: Normocephalic and atraumatic.     Nose: Nose  normal.     Mouth/Throat:     Mouth: Mucous membranes are moist.  Eyes:     Extraocular Movements: Extraocular movements intact.     Conjunctiva/sclera: Conjunctivae normal.  Cardiovascular:     Rate and Rhythm: Normal rate. Rhythm irregular.  Pulmonary:     Effort: Pulmonary effort is normal.     Breath sounds: Normal breath sounds.  Abdominal:     General: Abdomen is flat.     Palpations:  Abdomen is soft.     Tenderness: There is no abdominal tenderness. There is no guarding.  Musculoskeletal:        General: No swelling. Normal range of motion.     Cervical back: Neck supple.  Skin:    General: Skin is warm and dry.  Neurological:     General: No focal deficit present.     Mental Status: He is alert.  Psychiatric:        Mood and Affect: Mood normal.      ED Results / Procedures / Treatments   Labs (all labs ordered are listed, but only abnormal results are displayed) Labs Reviewed  COMPREHENSIVE METABOLIC PANEL - Abnormal; Notable for the following components:      Result Value   Glucose, Bld 124 (*)    Total Bilirubin 1.5 (*)    All other components within normal limits  CBC WITH DIFFERENTIAL/PLATELET - Abnormal; Notable for the following components:   RDW 15.8 (*)    All other components within normal limits  PROTIME-INR - Abnormal; Notable for the following components:   Prothrombin Time 15.8 (*)    INR 1.3 (*)    All other components within normal limits  URINALYSIS, ROUTINE W REFLEX MICROSCOPIC - Abnormal; Notable for the following components:   APPearance HAZY (*)    Hgb urine dipstick LARGE (*)    Ketones, ur 15 (*)    Protein, ur 30 (*)    Leukocytes,Ua LARGE (*)    RBC / HPF >50 (*)    WBC, UA >50 (*)    Bacteria, UA FEW (*)    All other components within normal limits  CULTURE, BLOOD (ROUTINE X 2)  RESP PANEL BY RT-PCR (FLU A&B, COVID) [id number]  URINE CULTURE  CULTURE, BLOOD (ROUTINE X 2)  LACTIC ACID, PLASMA  APTT    EKG EKG  Interpretation  Date/Time:  Friday Apr 28 2021 16:08:46 EDT Ventricular Rate:  89 PR Interval:    QRS Duration: 86 QT Interval:  381 QTC Calculation: 464 R Axis:   -14 Text Interpretation: Atrial fibrillation Ventricular premature complex RSR' in V1 or V2, probably normal variant Minimal ST depression, anterior leads No significant change since last tracing Confirmed by Calvert Cantor [id number]) on 04/28/2021 4:17:13 PM    Radiology DG Chest Port 1 View  Result Date: 04/28/2021 CLINICAL DATA:  Possible sepsis EXAM: PORTABLE CHEST 1 VIEW COMPARISON:  04/07/2021 FINDINGS: Cardiac shadow is stable. Lungs are well aerated bilaterally. Mild vascular congestion is noted without edema. No focal infiltrate is seen. No bony abnormality is noted. Stable scarring in the left base is noted. IMPRESSION: Mild vascular congestion.  No other acute abnormality is seen. Electronically Signed   By: Inez Catalina M.D.   On: 04/28/2021 16:42    Procedures Procedures  Medications Ordered in the ED Medications  0.9 %  sodium chloride infusion ( Intravenous Stopped 04/28/21 1923)  cefTRIAXone (ROCEPHIN) 1 g in sodium chloride 0.9 % 100 mL IVPB (0 g Intravenous Stopped 04/28/21 1903)     MDM Rules/Calculators/A&P MDM Patient with low grade fever, reported urinary frequency but no other LUTS. Will check initial sepsis labs. Hold on IVF now pending signs of hypotension or elevated lactic acid.  ED Course  I have reviewed the triage vital signs and the nursing notes.  Pertinent labs & imaging results that were available during my care of the patient were reviewed by me and considered in my medical decision making (see chart for details).  Clinical Course as of 04/28/21 1932  Fri Apr 28, 2021  1721 UA concerning for infection, CBC and CMP are unremarkable. Lactic acid is negative. Will give a dose of Rocephin. Culture sent.  [CS]  7782 Covid/Flu neg.  [CS]  4235 Patient continues to feel well and would like  to go home. Will d/c with Rx for Keflex and close PCP follow up.  [CS]    Clinical Course User Index [CS] Truddie Hidden, MD    Final Clinical Impression(s) / ED Diagnoses Final diagnoses:  Acute cystitis with hematuria    Rx / DC Orders ED Discharge Orders         Ordered    cephALEXin (KEFLEX) 500 MG capsule  3 times daily        04/28/21 1932           Truddie Hidden, MD 04/28/21 1932

## 2021-05-01 LAB — URINE CULTURE: Culture: 80000 — AB

## 2021-05-02 ENCOUNTER — Telehealth: Payer: Self-pay | Admitting: *Deleted

## 2021-05-02 NOTE — Telephone Encounter (Signed)
Unable to leave prescription at [facility] on [address].  Called to  [facility] [address] per pt request.  [phone].

## 2021-05-02 NOTE — Progress Notes (Signed)
ED Antimicrobial Stewardship Positive Culture Follow Up   Maurice Wood is an 87 y.o. male who presented to [facility] on 04/28/2021 with a chief complaint of  Chief Complaint  Patient presents with  . Urinary Frequency    Recent Results (from the past 720 hour(s))  Blood Culture (routine x 2)     Status: None (Preliminary result)   Collection Time: 04/28/21  4:02 PM   Specimen: BLOOD  Result Value Ref Range Status   Specimen Description BLOOD BLOOD RIGHT FOREARM  Final   Special Requests   Final    BOTTLES DRAWN AEROBIC AND ANAEROBIC Blood Culture adequate volume   Culture   Final    NO GROWTH 4 DAYS Performed at [facility], [address] [place], [place] [address]    Report Status PENDING  Incomplete  Blood Culture (routine x 2)     Status: None (Preliminary result)   Collection Time: 04/28/21  4:02 PM   Specimen: BLOOD  Result Value Ref Range Status   Specimen Description BLOOD BLOOD LEFT FOREARM  Final   Special Requests   Final    BOTTLES DRAWN AEROBIC AND ANAEROBIC Blood Culture results may not be optimal due to an inadequate volume of blood received in culture bottles   Culture   Final    NO GROWTH 4 DAYS Performed at [facility], [place] [address] [place], [place] [address]    Report Status PENDING  Incomplete  Resp Panel by RT-PCR (Flu A&B, Covid)     Status: None   Collection Time: 04/28/21  4:02 PM   Specimen: Nasopharyngeal(NP) swabs in vial transport medium  Result Value Ref Range Status   SARS Coronavirus 2 by RT PCR NEGATIVE NEGATIVE Final    Comment: (NOTE) SARS-CoV-2 target nucleic acids are NOT DETECTED.  The SARS-CoV-2 RNA is generally detectable in upper respiratory specimens during the acute phase of infection. The lowest concentration of SARS-CoV-2 viral copies this assay can detect is 138 copies/mL. A negative result does not preclude SARS-Cov-2 infection and should not be used as the sole basis for treatment or other patient  management decisions. A negative result may occur with  improper specimen collection/handling, submission of specimen other than nasopharyngeal swab, presence of viral mutation(s) within the areas targeted by this assay, and inadequate number of viral copies(<138 copies/mL). A negative result must be combined with clinical observations, patient history, and epidemiological information. The expected result is Negative.  Fact Sheet for Patients:  EntrepreneurPulse.com.au  Fact Sheet for Healthcare Providers:  IncredibleEmployment.be  This test is no t yet approved or cleared by the [place] FDA and  has been authorized for detection and/or diagnosis of SARS-CoV-2 by FDA under an Emergency Use Authorization (EUA). This EUA will remain  in effect (meaning this test can be used) for the duration of the COVID-19 declaration under Section 564(b)(1) of the Act, 21 U.S.C.section 360bbb-3(b)(1), unless the authorization is terminated  or revoked sooner.       Influenza A by PCR NEGATIVE NEGATIVE Final   Influenza B by PCR NEGATIVE NEGATIVE Final    Comment: (NOTE) The Xpert Xpress SARS-CoV-2/FLU/RSV plus assay is intended as an aid in the diagnosis of influenza from Nasopharyngeal swab specimens and should not be used as a sole basis for treatment. Nasal washings and aspirates are unacceptable for Xpert Xpress SARS-CoV-2/FLU/RSV testing.  Fact Sheet for Patients: EntrepreneurPulse.com.au  Fact Sheet for Healthcare Providers: IncredibleEmployment.be  This test is not yet approved  or cleared by the [place] and has been authorized for detection and/or diagnosis of SARS-CoV-2 by FDA under an Emergency Use Authorization (EUA). This EUA will remain in effect (meaning this test can be used) for the duration of the COVID-19 declaration under Section 564(b)(1) of the Act, 21 U.S.C. section 360bbb-3(b)(1),  unless the authorization is terminated or revoked.  Performed at [facility], [address] [place], [place] [address]   Urine culture     Status: Abnormal   Collection Time: 04/28/21  4:04 PM   Specimen: In/Out Cath Urine  Result Value Ref Range Status   Specimen Description   Final    IN/OUT CATH URINE Performed at [facility], [address], [place], [place] [address]    Special Requests   Final    NONE Performed at [facility], [address] [place],  [address]    Culture 80,000 COLONIES/mL ENTEROCOCCUS FAECALIS (A)  Final   Report Status 05/01/2021 FINAL  Final   Organism ID, Bacteria ENTEROCOCCUS FAECALIS (A)  Final      Susceptibility   Enterococcus faecalis - MIC*    AMPICILLIN <=2 SENSITIVE Sensitive     NITROFURANTOIN <=16 SENSITIVE Sensitive     VANCOMYCIN 1 SENSITIVE Sensitive     * 80,000 COLONIES/mL ENTEROCOCCUS FAECALIS    [x]  Treated with cephalexin, organism resistant to prescribed antimicrobial  New antibiotic prescription: amoxicillin 500 mg PO q8h x7 days STOP cephalexin   ED Provider: Deno Etienne, PA-C   [address] 05/02/2021, 9:42 AM Clinical Pharmacist Monday - Friday phone -  [phone] Saturday - Sunday phone - [phone]

## 2021-05-02 NOTE — Telephone Encounter (Signed)
Post ED Visit - Positive Culture Follow-up: Successful Patient Follow-Up  Culture assessed and recommendations reviewed by:  []  Elenor Quinones, Pharm.D. []  Heide Guile, Pharm.D., BCPS AQ-ID []  Parks Neptune, Pharm.D., BCPS []  Alycia Rossetti, Pharm.D., BCPS []  [place]an[place], Pharm.D., BCPS, AAHIVP []  Legrand Como, Pharm.D., BCPS, AAHIVP []  Salome Arnt, PharmD, BCPS []  Johnnette Gourd, PharmD, BCPS []  Hughes Better, PharmD, BCPS []  Leeroy Cha, PharmD  Positive urine culture  []  Patient discharged without antimicrobial prescription and treatment is now indicated [x]  Organism is resistant to prescribed ED discharge antimicrobial []  Patient with positive blood cultures  Changes discussed with ED provider Deno Etienne, [facility] New antibiotic prescription Amoxicillin 500mg  q8hrs x 7 days Called to [facility] Pal [phone]  Contacted patient, date 05/02/2021, time [place], [place] 05/02/2021, 10:32 AM

## 2021-05-03 ENCOUNTER — Other Ambulatory Visit: Payer: Self-pay

## 2021-05-03 ENCOUNTER — Ambulatory Visit: Payer: PPO | Admitting: Podiatry

## 2021-05-03 ENCOUNTER — Encounter: Payer: Self-pay | Admitting: Podiatry

## 2021-05-03 DIAGNOSIS — E1142 Type 2 diabetes mellitus with diabetic polyneuropathy: Secondary | ICD-10-CM

## 2021-05-03 DIAGNOSIS — D689 Coagulation defect, unspecified: Secondary | ICD-10-CM

## 2021-05-03 DIAGNOSIS — M79674 Pain in right toe(s): Secondary | ICD-10-CM

## 2021-05-03 DIAGNOSIS — M79675 Pain in left toe(s): Secondary | ICD-10-CM | POA: Diagnosis not present

## 2021-05-03 DIAGNOSIS — B351 Tinea unguium: Secondary | ICD-10-CM

## 2021-05-03 DIAGNOSIS — G629 Polyneuropathy, unspecified: Secondary | ICD-10-CM

## 2021-05-03 LAB — CULTURE, BLOOD (ROUTINE X 2)
Culture: NO GROWTH
Culture: NO GROWTH
Special Requests: ADEQUATE

## 2021-05-03 NOTE — Progress Notes (Signed)
This patient returns to my office for at risk foot care.  This patient requires this care by a professional since this patient will be at risk due to having diabetic neuropathy, and coagulation disorder.  Patient is taking eliquiss.  This patient is unable to cut nails himself since the patient cannot reach his nails.These nails are painful walking and wearing shoes.  This patient presents for at risk foot care today.  General Appearance  Alert, conversant and in no acute stress.  Vascular  Dorsalis pedis and posterior tibial  pulses are  not palpable  bilaterally.  Capillary return is within normal limits  bilaterally. Temperature is within normal limits  bilaterally.  Neurologic  Senn-Weinstein monofilament wire test absent   bilaterally. Muscle power within normal limits bilaterally.  Nails Thick disfigured discolored nails with subungual debris  hallux nails  bilaterally. No evidence of bacterial infection or drainage bilaterally.  Orthopedic  No limitations of motion  feet .  No crepitus or effusions noted.  No bony pathology or digital deformities noted.  Skin  normotropic skin with no porokeratosis noted bilaterally.  No signs of infections or ulcers noted.     Onychomycosis  Pain in right toes  Pain in left toes  Consent was obtained for treatment procedures.   Mechanical debridement of nails 1-5  bilaterally performed with a nail nipper.  Filed with dremel without incident.    Return office visit    3 months                  Told patient to return for periodic foot care and evaluation due to potential at risk complications.   Abdulahi Schor DPM  

## 2021-05-09 NOTE — Therapy (Signed)
OUTPATIENT PHYSICAL THERAPY VESTIBULAR EVALUATION     Patient Name: Maurice Wood MRN: [id number] DOB:01/12/1933, 87 y.o., male Today's Date: 05/10/2021  PCP: Antony Contras, MD REFERRING PROVIDER: Pieter Partridge, DO   PT End of Session - 05/10/21 1443    Visit Number 1    Number of Visits 9    Date for PT Re-Evaluation 07/05/21   POC for 8 weeks   Authorization Type HTA (10th Visit PN)    Progress Note Due on Visit 10    PT Start Time 1401    PT Stop Time 1445    PT Time Calculation (min) 44 min    Activity Tolerance Patient tolerated treatment well    Behavior During Therapy WFL for tasks assessed/performed           Past Medical History:  Diagnosis Date  . Allergy    rhinitis  . Atrial fibrillation [facility]) Jan 2007   echo 1-07 normal ejection fraction, no significant valvular disease. adenosine cardiolite 1-07  no evidence of ischemia. A 48 hour Holter monitor in 7-07 showed good rate controlw/ chronic atrial fibrillation. Cath 1/11 normal cors. EF 45%. Echo 2-11 60-65%  . Benign prostatic hypertrophy   . Cerebral aneurysm    followed by Dr Estanislado Pandy  . CHF (congestive heart failure) ([place])   . COPD (chronic obstructive pulmonary disease) ([place])   . Fatigue   . History of chest pain    a. s/p LHC 05/2014 with normal cors  . Hx of colonic polyps    diverticulosis  . Hyperlipidemia   . IBS (irritable bowel syndrome)   . Incontinence    Per pt 12/14/11  . Kidney stones   . Obesity   . Obstructive sleep apnea    noncompliant with CPAP  . Retinal vein occlusion    Past Surgical History:  Procedure Laterality Date  . CARDIAC CATHETERIZATION  1999  . CHOLECYSTECTOMY    . HERNIA REPAIR    . inguinal and umbilical herniorrhaphy    . INTERSTIM IMPLANT PLACEMENT  04/2017  . LEFT HEART CATHETERIZATION WITH CORONARY ANGIOGRAM N/A 06/01/2014   Procedure: LEFT HEART CATHETERIZATION WITH CORONARY ANGIOGRAM;  Surgeon: Peter M Martinique, MD;  Location[facility] CATH LAB;  Service:  Cardiovascular;  Laterality: N/A;  . LITHOTRIPSY    . RIGHT/LEFT HEART CATH AND CORONARY ANGIOGRAPHY N/A 07/08/2020   Procedure: RIGHT/LEFT HEART CATH AND CORONARY ANGIOGRAPHY;  Surgeon: Jolaine Artist, MD;  Location: [facility] CV LAB;  Service: Cardiovascular;  Laterality: N/A;  . TEE WITHOUT CARDIOVERSION N/A 07/05/2020   Procedure: TRANSESOPHAGEAL ECHOCARDIOGRAM (TEE);  Surgeon: Jolaine Artist, MD;  Location[facility] ENDOSCOPY;  Service: Cardiovascular;  Laterality: N/A;  . TONSILLECTOMY AND ADENOIDECTOMY  as a child   Patient Active Problem List   Diagnosis Date Noted  . Chorioretinitis 02/01/2021  . Chronic pain 02/01/2021  . Insomnia 02/01/2021  . Interstitial lung disease ([place]) 02/01/2021  . Nausea 02/01/2021  . Overactive bladder 02/01/2021  . Polyneuropathy 02/01/2021  . Pulmonary nodule 02/01/2021  . Seasonal allergic rhinitis 02/01/2021  . Vitamin D deficiency 02/01/2021  . COPD, moderate ([place]) 08/08/2020  . Body mass index (BMI) 28.0-28.9, adult 02/04/2020  . Elevated blood-pressure reading, without diagnosis of hypertension 02/04/2020  . Body mass index (BMI) 27.0-27.9, adult 01/18/2020  . Bilateral impacted cerumen 12/21/2019  . Pain due to onychomycosis of toenails of both feet 05/26/2019  . Coagulation disorder ([place]) 05/26/2019  . Diabetic neuropathy ([place]) 05/26/2019  . Pain in joint of  right shoulder 08/13/2018  . Left ear pain 06/05/2016  . Sensorineural hearing loss (SNHL) of both ears 06/05/2016  . Pseudophakia of both eyes 04/03/2016  . PVD (posterior vitreous detachment), both eyes 04/03/2016  . Bilateral lower extremity edema 06/02/2015  . UTI (lower urinary tract infection) 05/25/2015  . Sepsis ([place]) 05/25/2015  . Fever 05/25/2015  . Cough 05/25/2015  . Kidney stone 05/25/2015  . Hypokalemia 05/25/2015  . Acute urinary retention   . H/O cardiac catheterizationn 12/2009 with normal coronary arteries 05/30/2014  . Cerebral arterial aneurysm  04/20/2014  . Low back pain 12/17/2013  . Chest pain 09/10/2013  . Branch retinal vein occlusion 04/11/2012  . Cystoid macular edema, left 04/11/2012  . Epiretinal membrane, left 04/11/2012  . Increased frequency of urination 10/08/2011  . Urge incontinence 10/08/2011  . Urinary incontinence 06/04/2011  . Long term current use of anticoagulant 01/12/2011  . DM type 2 (diabetes mellitus, type 2) ([place]) 07/31/2010  . HEMORRHOIDS-INTERNAL 06/27/2010  . Sleep apnea 06/27/2010  . DIVERTICULOSIS OF COLON 01/03/2010  . GERD 11/22/2009  . IRRITABLE BOWEL SYNDROME 11/22/2009  . ERECTILE DYSFUNCTION 06/06/2009  . BPH (benign prostatic hyperplasia) 06/18/2008  . PSA, INCREASED 12/19/2007  . OBSTRUCTIVE SLEEP APNEA 07/24/2007  . DISSECTION, CAROTID ARTERY 07/24/2007  . TESTOSTERONE DEFICIENCY 07/23/2007  . HLD (hyperlipidemia) 07/23/2007  . COLONIC POLYPS, HX OF 07/23/2007  . Atrial fibrillation, permanent ([place]) 07/14/2007    ONSET DATE: Referral Date: 04/14/21  REFERRING DIAG: Vertigo (R42)   THERAPY DIAG:  Dizziness and giddiness  Unsteadiness on feet  SUBJECTIVE:   SUBJECTIVE STATEMENT: Patient reports that he has been having dizziness with position changes, often feels it when he stands or changes position quickly. Patient reports he has had 1 fall in the last 6 months, was unable to get up. Patient reports a swimmy headed sensation. Patient reports the dizziness occurs daily, sensation is only brief. Denies dizziness with bed mobility or laying down in bed. Patient reports it has been an on-going issue for quite some time. Patient is currently ambulating with a SPC. Denies changes in hearing/vision. Denies tinnitus.                                                                                                                                                                                                        PERTINENT HISTORY: atrial fibrillation, mitral regurgitation, OSA, cerebral  aneurysm x2 (stable- 4 x 40mm right MCA bifurcation and 2 mm left PCOM -02/03/2020), polyneuropathy and history of right carotid dissection, COPD   PAIN:  Are you having  pain? No   PRECAUTIONS: Fall and Other: atrial fibrillation, mitral regurgitation, OSA, cerebral aneurysm x2 (stable- 4 x 74mm right MCA bifurcation and 2 mm left PCOM -02/03/2020), polyneuropathy and history of right carotid dissection   WEIGHT BEARING RESTRICTIONS No  FALLS: Has patient fallen in last 6 months? Yes, Number of falls: 1  LIVING ENVIRONMENT: Lives with: lives with their spouse Lives in: House/apartment Stairs: Yes; External: 5 steps; Rail on R/L going up Patient reports that he does typically go up the front stairs.  Has following equipment at home: Single point cane  PLOF: Independent with household mobility with device and Independent with community mobility with device  PATIENT GOALS: Get Rid of the Dizziness; Improve the Balance  OBJECTIVE:   DIAGNOSTIC FINDINGS: Moderate cerebral atrophy.  Comparatively mild cerebellar atrophy.Moderate multifocal T2/FLAIR hyperintensity within the cerebral white matter, nonspecific but compatible with chronic small vessel ischemic disease. Redemonstrated chronic infarcts within the left cerebellar hemisphere. There is no acute infarct.  COGNITION: Overall cognitive status: Within functional limits for tasks assessed   SENSATION: Light touch: Appears intact   POSTURE: rounded shoulders and forward head   TRANSFERS: Assistive device utilized: Single point cane  Sit to stand: SBA Stand to sit: SBA  GAIT: Gait pattern: step through pattern and wide BOS Distance walked: 50 ft Assistive device utilized: Single point cane Level of assistance: SBA Comments: mild unsteadiness with ambulation   PATIENT SURVEYS:  DFS: 51, DPS: 58   VESTIBULAR ASSESSMENT     SYMPTOM BEHAVIOR:   Subjective history: see subjective   Non-Vestibular symptoms: None   Type  of dizziness: Imbalance (Disequilibrium), Spinning/Vertigo and Unsteady with head/body turns   Frequency: daily   Duration: seconds to minutes   Aggravating factors: Induced by position change: sit to stand and quick body movements   Relieving factors: rest and slow movements   Progression of symptoms: unchanged   OCULOMOTOR EXAM:   Ocular Alignment: normal   Ocular ROM: No Limitations   Spontaneous Nystagmus: absent   Gaze-Induced Nystagmus: absent   Smooth Pursuits: intact   Saccades: hypometric/undershoots       VESTIBULAR - OCULAR REFLEX:    Slow VOR: Normal   VOR Cancellation: Normal   Head-Impulse Test: HIT Right: positive HIT Left: positive   Dynamic Visual Acuity: Static: Line 9  Dynamic: Line 5 (reports blurry vision)    POSITIONAL TESTING: Right Dix-Hallpike: none; Duration: Left Dix-Hallpike: none; Duration: Right Roll Test: none; Duration: Left Roll Test: none; Duration:    MOTION SENSITIVITY:    Motion Sensitivity Quotient  Intensity: 0 = none, 1 = Lightheaded, 2 = Mild, 3 = Moderate, 4 = Severe, 5 = Vomiting  Intensity  1. Sitting to supine 0  2. Supine to L side 0  3. Supine to R side 0  4. Supine to sitting 0  5. L Hallpike-Dix 0  6. Up from L  0  7. R Hallpike-Dix 0  8. Up from R  0  9. Sitting, head  tipped to L knee 0  10. Head up from L  knee 0  11. Sitting, head  tipped to R knee 0  12. Head up from R  knee 0  13. Sitting head turns x5 0  14.Sitting head nods x5 0  15. In stance, 180  turn to L  0  16. In stance, 180  turn to R 0    ORTHOSTATICS:   Supine: 118/69, HR: 72 (no dizziness/lightheadedness)  Seated: 128/74, HR: 76 (no  dizziness/lightheadedness)  Standing (1 minute): 123/73, HR: 80 (no dizziness/lightheadedness)  Standing (3 minute): 134/81, HR: 80 (no dizziness/lightheadedness)    PATIENT EDUCATION: Education details: Educated on [employer] Person educated: Patient and Spouse Education method:  Explanation Education comprehension: verbalized understanding  ASSESSMENT:  CLINICAL IMPRESSION: Patient is a 87 y.o. male who was seen today for physical therapy evaluation and treatment for Vertigo. Patient presents with negative positional testing. Patient does have B Positive HIT and 4 line difference on DVA indicating impaired VOR. No significant dizziness during evaluation, however patient demonstrating impaired balance. Objective impairments include decreased balance, difficulty walking, dizziness and postural dysfunction. These impairments are limiting patient from community activity and yard work. Personal factors including Age, Time since onset of injury/illness/exacerbation and 3+ comorbidities: atrial fibrillation, mitral regurgitation, OSA, cerebral aneurysm x2 (stable- 4 x 33mm right MCA bifurcation and 2 mm left PCOM -02/03/2020), polyneuropathy and history of right carotid dissection, COPD are also affecting patient's functional outcome. Patient will benefit from skilled PT to address above impairments and improve overall function.  REHAB POTENTIAL: Good  CLINICAL DECISION MAKING: Stable/uncomplicated  EVALUATION COMPLEXITY: Low   GOALS: Goals reviewed with patient? Yes  SHORT TERM GOALS:   STG Name Target Date Goal status  1 Patient will undergo FGA and LTG to be set as applicable Baseline:  2/62/03 INITIAL   LONG TERM GOALS:   LTG Name Target Date Goal status  1 Patient will be independent with vestibular/balance HEP Baseline: 07/05/21 INITIAL  2 Patient will improve DVA to </= 2 line difference to demonstrate improved VOR Baseline: 4 line difference 07/05/21 INITIAL  3 Pt will demonstrate 4 point improvement in FGA to indicate decreased falls risk Baseline: TBA 07/05/21 INITIAL  4 Patient will improve DFS >/= 59 , and DPS >/= 62 Baseline: 07/05/21 INITIAL   PLAN: PT FREQUENCY: 1x/week  PT DURATION: 8 weeks  PLANNED INTERVENTIONS: Therapeutic exercises,  Therapeutic activity, Neuro Muscular re-education, Balance training, Gait training, Patient/Family education, Joint mobilization, Stair training, Vestibular training, Canalith repositioning, Orthotic/Fit training and DME instructions  PLAN FOR NEXT SESSION: Assess FGA. Initiate VOR x 1 and Balance HEP.    Jones Bales, PT, DPT 05/10/2021, 5:07 PM  [facility] [address] [place], [place], [address] Phone: [phone]   Fax:  [phone]

## 2021-05-10 ENCOUNTER — Other Ambulatory Visit: Payer: Self-pay

## 2021-05-10 ENCOUNTER — Ambulatory Visit: Payer: PPO | Attending: Neurology

## 2021-05-10 DIAGNOSIS — R42 Dizziness and giddiness: Secondary | ICD-10-CM | POA: Diagnosis not present

## 2021-05-10 DIAGNOSIS — R2681 Unsteadiness on feet: Secondary | ICD-10-CM | POA: Insufficient documentation

## 2021-05-17 ENCOUNTER — Other Ambulatory Visit (HOSPITAL_COMMUNITY): Payer: Self-pay

## 2021-05-17 DIAGNOSIS — K219 Gastro-esophageal reflux disease without esophagitis: Secondary | ICD-10-CM | POA: Diagnosis not present

## 2021-05-17 DIAGNOSIS — K580 Irritable bowel syndrome with diarrhea: Secondary | ICD-10-CM | POA: Diagnosis not present

## 2021-05-17 DIAGNOSIS — R110 Nausea: Secondary | ICD-10-CM | POA: Diagnosis not present

## 2021-05-26 ENCOUNTER — Ambulatory Visit: Payer: PPO | Admitting: Physical Therapy

## 2021-05-26 ENCOUNTER — Other Ambulatory Visit: Payer: Self-pay

## 2021-05-26 DIAGNOSIS — R2681 Unsteadiness on feet: Secondary | ICD-10-CM

## 2021-05-26 DIAGNOSIS — R42 Dizziness and giddiness: Secondary | ICD-10-CM

## 2021-05-26 NOTE — Patient Instructions (Signed)
Gaze Stabilization - Tip Card  1.Target must remain in focus, not blurry, and appear stationary while head is in motion. 2.Perform exercises with small head movements (45 to either side of midline). 3.Increase speed of head motion so long as target is in focus. 4.If you wear eyeglasses, be sure you can see target through lens (therapist will give specific instructions for bifocal / progressive lenses). 5.These exercises may provoke dizziness or nausea. Work through these symptoms. If too dizzy, slow head movement slightly. Rest between each exercise. 6.Exercises demand concentration; avoid distractions. 7.For safety, perform standing exercises close to a counter, wall, corner, or next to someone.  Copyright  VHI. All rights reserved.   Gaze Stabilization - Standing Feet Apart   Feet shoulder width apart, keeping eyes on target on wall 3 feet away, tilt head down slightly and move head side to side for 60 seconds.  Repeat while moving head up and down for 60 seconds.  Do 2-3 sessions per day.

## 2021-05-26 NOTE — Therapy (Signed)
OUTPATIENT PHYSICAL THERAPY VESTIBULAR TREATMENT NOTE   Patient Name: Maurice Wood MRN: [id number] DOB:1933-08-26, 87 y.o., male Today's Date: 05/26/2021  PCP: Antony Contras, MD REFERRING PROVIDER: Pieter Partridge, DO   PT End of Session - 05/26/21 1324     Visit Number 2    Number of Visits 9    Date for PT Re-Evaluation 07/05/21   POC for 8 weeks   Authorization Type HTA (10th Visit PN)    Progress Note Due on Visit 10    PT Start Time 1322    PT Stop Time 2536    PT Time Calculation (min) 36 min    Activity Tolerance Patient tolerated treatment well    Behavior During Therapy [facility] for tasks assessed/performed             Past Medical History:  Diagnosis Date   Allergy    rhinitis   Atrial fibrillation ([place]) Jan 2007   echo 1-07 normal ejection fraction, no significant valvular disease. adenosine cardiolite 1-07  no evidence of ischemia. A 48 hour Holter monitor in 7-07 showed good rate controlw/ chronic atrial fibrillation. Cath 1/11 normal cors. EF 45%. Echo 2-11 60-65%   Benign prostatic hypertrophy    Cerebral aneurysm    followed by Dr Estanislado Pandy   CHF (congestive heart failure) ([facility])    COPD (chronic obstructive pulmonary disease) ([facility])    Fatigue    History of chest pain    a. s/p LHC 05/2014 with normal cors   Hx of colonic polyps    diverticulosis   Hyperlipidemia    IBS (irritable bowel syndrome)    Incontinence    Per pt 12/14/11   Kidney stones    Obesity    Obstructive sleep apnea    noncompliant with CPAP   Retinal vein occlusion    Past Surgical History:  Procedure Laterality Date   CARDIAC CATHETERIZATION  1999   CHOLECYSTECTOMY     HERNIA REPAIR     inguinal and umbilical herniorrhaphy     INTERSTIM IMPLANT PLACEMENT  04/2017   LEFT HEART CATHETERIZATION WITH CORONARY ANGIOGRAM N/A 06/01/2014   Procedure: LEFT HEART CATHETERIZATION WITH CORONARY ANGIOGRAM;  Surgeon: Peter M Martinique, MD;  Location: [facility] CATH LAB;  Service: Cardiovascular;   Laterality: N/A;   LITHOTRIPSY     RIGHT/LEFT HEART CATH AND CORONARY ANGIOGRAPHY N/A 07/08/2020   Procedure: RIGHT/LEFT HEART CATH AND CORONARY ANGIOGRAPHY;  Surgeon: Jolaine Artist, MD;  Location: [place] CV LAB;  Service: Cardiovascular;  Laterality: N/A;   TEE WITHOUT CARDIOVERSION N/A 07/05/2020   Procedure: TRANSESOPHAGEAL ECHOCARDIOGRAM (TEE);  Surgeon: Jolaine Artist, MD;  Location: [facility] ENDOSCOPY;  Service: Cardiovascular;  Laterality: N/A;   TONSILLECTOMY AND ADENOIDECTOMY  as a child   Patient Active Problem List   Diagnosis Date Noted   Chorioretinitis 02/01/2021   Chronic pain 02/01/2021   Insomnia 02/01/2021   Interstitial lung disease ([place]) 02/01/2021   Nausea 02/01/2021   Overactive bladder 02/01/2021   Polyneuropathy 02/01/2021   Pulmonary nodule 02/01/2021   Seasonal allergic rhinitis 02/01/2021   Vitamin D deficiency 02/01/2021   COPD, moderate ([place]) 08/08/2020   Body mass index (BMI) 28.0-28.9, adult 02/04/2020   Elevated blood-pressure reading, without diagnosis of hypertension 02/04/2020   Body mass index (BMI) 27.0-27.9, adult 01/18/2020   Bilateral impacted cerumen 12/21/2019   Pain due to onychomycosis of toenails of both feet 05/26/2019   Coagulation disorder ([place]) 05/26/2019   Diabetic neuropathy ([place]) 05/26/2019   Pain in  joint of right shoulder 08/13/2018   Left ear pain 06/05/2016   Sensorineural hearing loss (SNHL) of both ears 06/05/2016   Pseudophakia of both eyes 04/03/2016   PVD (posterior vitreous detachment), both eyes 04/03/2016   Bilateral lower extremity edema 06/02/2015   UTI (lower urinary tract infection) 05/25/2015   Sepsis ([place]) 05/25/2015   Fever 05/25/2015   Cough 05/25/2015   Kidney stone 05/25/2015   Hypokalemia 05/25/2015   Acute urinary retention    H/O cardiac catheterizationn 12/2009 with normal coronary arteries 05/30/2014   Cerebral arterial aneurysm 04/20/2014   Low back pain 12/17/2013   Chest pain  09/10/2013   Branch retinal vein occlusion 04/11/2012   Cystoid macular edema, left 04/11/2012   Epiretinal membrane, left 04/11/2012   Increased frequency of urination 10/08/2011   Urge incontinence 10/08/2011   Urinary incontinence 06/04/2011   Long term current use of anticoagulant 01/12/2011   DM type 2 (diabetes mellitus, type 2) ([place]) 07/31/2010   HEMORRHOIDS-INTERNAL 06/27/2010   Sleep apnea 06/27/2010   DIVERTICULOSIS OF COLON 01/03/2010   GERD 11/22/2009   IRRITABLE BOWEL SYNDROME 11/22/2009   ERECTILE DYSFUNCTION 06/06/2009   BPH (benign prostatic hyperplasia) 06/18/2008   PSA, INCREASED 12/19/2007   OBSTRUCTIVE SLEEP APNEA 07/24/2007   DISSECTION, CAROTID ARTERY 07/24/2007   TESTOSTERONE DEFICIENCY 07/23/2007   HLD (hyperlipidemia) 07/23/2007   COLONIC POLYPS, HX OF 07/23/2007   Atrial fibrillation, permanent ([place]) 07/14/2007    REFERRING DIAG: Vertigo  THERAPY DIAG:  Dizziness and giddiness  Unsteadiness on feet  PERTINENT HISTORY: atrial fibrillation, mitral regurgitation, OSA, cerebral aneurysm x2 (stable- 4 x 37m right MCA bifurcation and 2 mm left PCOM -02/03/2020), polyneuropathy and history of right carotid dissection, COPD    PRECAUTIONS:  Fall and Other: atrial fibrillation, mitral regurgitation, OSA, cerebral aneurysm x2 (stable- 4 x 583mright MCA bifurcation and 2 mm left PCOM -02/03/2020), polyneuropathy and history of right carotid dissection   SUBJECTIVE: Pt walking with walking stick today for balance.  No issues since Eval.  No falls but sometimes when he gets up quickly he gets a little dizzy and off balance.  PAIN:  Are you having pain? No    OBJECTIVE:    [facility] - 05/26/21 1327       Functional Gait  Assessment   Gait assessed  Yes    Gait Level Surface Walks 20 ft in less than 7 sec but greater than 5.5 sec, uses assistive device, slower speed, mild gait deviations, or deviates 6-10 in outside of the 12 in walkway width.     Change in Gait Speed Able to smoothly change walking speed without loss of balance or gait deviation. Deviate no more than 6 in outside of the 12 in walkway width.    Gait with Horizontal Head Turns Performs head turns smoothly with no change in gait. Deviates no more than 6 in outside 12 in walkway width    Gait with Vertical Head Turns Performs head turns with no change in gait. Deviates no more than 6 in outside 12 in walkway width.    Gait and Pivot Turn Pivot turns safely within 3 sec and stops quickly with no loss of balance.    Step Over Obstacle Is able to step over 2 stacked shoe boxes taped together (9 in total height) without changing gait speed. No evidence of imbalance.    Gait with Narrow Base of Support Ambulates 4-7 steps.    Gait with Eyes Closed Cannot walk 20 ft  without assistance, severe gait deviations or imbalance, deviates greater than 15 in outside 12 in walkway width or will not attempt task.    Ambulating Backwards Walks 20 ft, uses assistive device, slower speed, mild gait deviations, deviates 6-10 in outside 12 in walkway width.    Steps Alternating feet, must use rail.    Total Score 22    FGA comment: 22/30 low falls risk            VESTIBULAR TREATMENT:     Gaze Adaptation: x1 Viewing Horizontal: Position: standing feet apart, Time: 60 seconds, Reps: 2, and Comment: No dizziness but demonstrated mild imbalance/sway when performing and x1 Viewing Vertical:  Position: Standing feet apart, Time: 60 seconds, Reps: 2, and Comment: No dizziness, mild imbalance when performing   PATIENT EDUCATION: Education details: Results of FGA assessment, initial HEP with x1 viewing Person educated: Patient Education method: Explanation, Demonstration, Handout Education comprehension: verbalized understanding, return demonstration   ASSESSMENT:   CLINICAL IMPRESSION:  Continued assessment of dynamic balance and falls risk with FGA.  Pt ambulated with walking stick in the  air for the full test; pt is at lower falls risk and had greatest difficulty with narrow BOS and with vision removed.  Initiated HEP with VOR x1 viewing; will continue to add exercises to address balance impairments at next visit.    REHAB POTENTIAL: Good   CLINICAL DECISION MAKING: Stable/uncomplicated   EVALUATION COMPLEXITY: Low     GOALS: Goals reviewed with patient? Yes   SHORT TERM GOALS:    STG Name Target Date Goal status  1 Patient will undergo FGA and LTG to be set as applicable Baseline:  2/99/24 MET    LONG TERM GOALS:   LTG Name Target Date Goal status  1 Patient will be independent with vestibular/balance HEP Baseline: 07/05/21 INITIAL  2 Patient will improve DVA to </= 2 line difference to demonstrate improved VOR Baseline: 4 line difference 07/05/21 INITIAL  3 Pt will demonstrate 4 point improvement in FGA to indicate decreased falls risk Baseline: 22/30 07/05/21 INITIAL  4 Patient will improve DFS >/= 59 , and DPS >/= 62 Baseline: 07/05/21 INITIAL    PLAN: PT FREQUENCY: 1x/week   PT DURATION: 8 weeks   PLANNED INTERVENTIONS: Therapeutic exercises, Therapeutic activity, Neuro Muscular re-education, Balance training, Gait training, Patient/Family education, Joint mobilization, Stair training, Vestibular training, Canalith repositioning, Orthotic/Fit training and DME instructions   PLAN FOR NEXT SESSION:  Progress VOR x 1 and add Balance to HEP especially narrow BOS and eyes closed; neuropathy - balance reactions.    Rico Junker, PT, DPT 05/26/21    3:24 PM      [facility] [address] [place], [place], [address] Phone: [phone]   Fax:  [phone]  Patient name: Maurice Wood MRN: [id number] DOB: [birth year]

## 2021-05-29 DIAGNOSIS — N401 Enlarged prostate with lower urinary tract symptoms: Secondary | ICD-10-CM | POA: Diagnosis not present

## 2021-05-29 DIAGNOSIS — N3941 Urge incontinence: Secondary | ICD-10-CM | POA: Diagnosis not present

## 2021-05-30 ENCOUNTER — Ambulatory Visit: Payer: PPO | Admitting: Physical Therapy

## 2021-05-30 ENCOUNTER — Other Ambulatory Visit: Payer: Self-pay

## 2021-05-30 DIAGNOSIS — R2681 Unsteadiness on feet: Secondary | ICD-10-CM

## 2021-05-30 DIAGNOSIS — R42 Dizziness and giddiness: Secondary | ICD-10-CM | POA: Diagnosis not present

## 2021-05-30 NOTE — Patient Instructions (Signed)
Gaze Stabilization - Tip Card  1.Target must remain in focus, not blurry, and appear stationary while head is in motion. 2.Perform exercises with small head movements (45 to either side of midline). 3.Increase speed of head motion so long as target is in focus. 4.If you wear eyeglasses, be sure you can see target through lens (therapist will give specific instructions for bifocal / progressive lenses). 5.These exercises may provoke dizziness or nausea. Work through these symptoms. If too dizzy, slow head movement slightly. Rest between each exercise. 6.Exercises demand concentration; avoid distractions. 7.For safety, perform standing exercises close to a counter, wall, corner, or next to someone.  Copyright  VHI. All rights reserved.   Gaze Stabilization - Standing Feet Apart   Feet shoulder width apart, keeping eyes on target on wall 3 feet away, tilt head down slightly and move head side to side for 60 seconds; shorten range of motion to avoid double vision. Repeat while moving head up and down for 60 seconds at slightly faster speed (think tick tock) Do 2-3 sessions per day.    Sit to Stand: Head Upright    With head upright, stand up pushing lightly with hands.  When sitting back down, keep hands on thighs and sit down very SLOWLY letting hips, knees and ankles bend. Repeat __10__ times per session. Do __2__ sessions per day.     Feet Together, Head Motion - Eyes Closed      With eyes closed and feet together, move head slowly, up and down 10 times.  Rest and then repeat moving head side to side 10 times, slowly.   Repeat 2 times per session. Do 2 sessions per day.     Weight Shift: Diagonal    Stand holding a chair with one hand.  One foot forward, one foot back.  Slowly shift weight forward over right leg by pushing through left toes, lifting left heel. Shift backward over opposite leg, lifting toes of front foot.  Repeat 10 times with right foot forward, switch  and repeat with left foot forward. Do __2__ sessions per day.     Hip Abduction (Standing)    Stand with support. Lift right leg out to side, keeping toe forward - imagine you are pushing a heavy box away.  Repeat 10 times to the right. Repeat with the left leg.  Try to keep your chest up tall and hips level. Repeat _10__ times. Do __2_ times a day.

## 2021-05-30 NOTE — Therapy (Signed)
OUTPATIENT PHYSICAL THERAPY VESTIBULAR TREATMENT NOTE   Patient Name: Maurice Wood MRN: [id number] DOB:05/24/1933, 87 y.o., male Today's Date: 05/30/2021  PCP: Antony Contras, MD REFERRING PROVIDER: Pieter Partridge, DO   PT End of Session - 05/30/21 1324     Visit Number 3    Number of Visits 9    Date for PT Re-Evaluation 07/05/21   POC for 8 weeks   Authorization Type HTA (10th Visit PN)    Progress Note Due on Visit 10    PT Start Time 1322    PT Stop Time 1402    PT Time Calculation (min) 40 min    Activity Tolerance Patient tolerated treatment well    Behavior During Therapy [facility] for tasks assessed/performed              Past Medical History:  Diagnosis Date   Allergy    rhinitis   Atrial fibrillation ([place]) Jan 2007   echo 1-07 normal ejection fraction, no significant valvular disease. adenosine cardiolite 1-07  no evidence of ischemia. A 48 hour Holter monitor in 7-07 showed good rate controlw/ chronic atrial fibrillation. Cath 1/11 normal cors. EF 45%. Echo 2-11 60-65%   Benign prostatic hypertrophy    Cerebral aneurysm    followed by Dr Estanislado Pandy   CHF (congestive heart failure) (HCC)    COPD (chronic obstructive pulmonary disease) (HCC)    Fatigue    History of chest pain    a. s/p LHC 05/2014 with normal cors   Hx of colonic polyps    diverticulosis   Hyperlipidemia    IBS (irritable bowel syndrome)    Incontinence    Per pt 12/14/11   Kidney stones    Obesity    Obstructive sleep apnea    noncompliant with CPAP   Retinal vein occlusion    Past Surgical History:  Procedure Laterality Date   CARDIAC CATHETERIZATION  1999   CHOLECYSTECTOMY     HERNIA REPAIR     inguinal and umbilical herniorrhaphy     INTERSTIM IMPLANT PLACEMENT  04/2017   LEFT HEART CATHETERIZATION WITH CORONARY ANGIOGRAM N/A 06/01/2014   Procedure: LEFT HEART CATHETERIZATION WITH CORONARY ANGIOGRAM;  Surgeon: Peter M Martinique, MD;  Location: [facility] CATH LAB;  Service: Cardiovascular;   Laterality: N/A;   LITHOTRIPSY     RIGHT/LEFT HEART CATH AND CORONARY ANGIOGRAPHY N/A 07/08/2020   Procedure: RIGHT/LEFT HEART CATH AND CORONARY ANGIOGRAPHY;  Surgeon: Jolaine Artist, MD;  Location: [place] CV LAB;  Service: Cardiovascular;  Laterality: N/A;   TEE WITHOUT CARDIOVERSION N/A 07/05/2020   Procedure: TRANSESOPHAGEAL ECHOCARDIOGRAM (TEE);  Surgeon: Jolaine Artist, MD;  Location: [facility] ENDOSCOPY;  Service: Cardiovascular;  Laterality: N/A;   TONSILLECTOMY AND ADENOIDECTOMY  as a child   Patient Active Problem List   Diagnosis Date Noted   Chorioretinitis 02/01/2021   Chronic pain 02/01/2021   Insomnia 02/01/2021   Interstitial lung disease ([place]) 02/01/2021   Nausea 02/01/2021   Overactive bladder 02/01/2021   Polyneuropathy 02/01/2021   Pulmonary nodule 02/01/2021   Seasonal allergic rhinitis 02/01/2021   Vitamin D deficiency 02/01/2021   COPD, moderate ([place]) 08/08/2020   Body mass index (BMI) 28.0-28.9, adult 02/04/2020   Elevated blood-pressure reading, without diagnosis of hypertension 02/04/2020   Body mass index (BMI) 27.0-27.9, adult 01/18/2020   Bilateral impacted cerumen 12/21/2019   Pain due to onychomycosis of toenails of both feet 05/26/2019   Coagulation disorder ([place]) 05/26/2019   Diabetic neuropathy ([place]) 05/26/2019   Pain  in joint of right shoulder 08/13/2018   Left ear pain 06/05/2016   Sensorineural hearing loss (SNHL) of both ears 06/05/2016   Pseudophakia of both eyes 04/03/2016   PVD (posterior vitreous detachment), both eyes 04/03/2016   Bilateral lower extremity edema 06/02/2015   UTI (lower urinary tract infection) 05/25/2015   Sepsis ([place]) 05/25/2015   Fever 05/25/2015   Cough 05/25/2015   Kidney stone 05/25/2015   Hypokalemia 05/25/2015   Acute urinary retention    H/O cardiac catheterizationn 12/2009 with normal coronary arteries 05/30/2014   Cerebral arterial aneurysm 04/20/2014   Low back pain 12/17/2013   Chest pain  09/10/2013   Branch retinal vein occlusion 04/11/2012   Cystoid macular edema, left 04/11/2012   Epiretinal membrane, left 04/11/2012   Increased frequency of urination 10/08/2011   Urge incontinence 10/08/2011   Urinary incontinence 06/04/2011   Long term current use of anticoagulant 01/12/2011   DM type 2 (diabetes mellitus, type 2) ([place]) 07/31/2010   HEMORRHOIDS-INTERNAL 06/27/2010   Sleep apnea 06/27/2010   DIVERTICULOSIS OF COLON 01/03/2010   GERD 11/22/2009   IRRITABLE BOWEL SYNDROME 11/22/2009   ERECTILE DYSFUNCTION 06/06/2009   BPH (benign prostatic hyperplasia) 06/18/2008   PSA, INCREASED 12/19/2007   OBSTRUCTIVE SLEEP APNEA 07/24/2007   DISSECTION, CAROTID ARTERY 07/24/2007   TESTOSTERONE DEFICIENCY 07/23/2007   HLD (hyperlipidemia) 07/23/2007   COLONIC POLYPS, HX OF 07/23/2007   Atrial fibrillation, permanent ([place]) 07/14/2007    REFERRING DIAG: Vertigo  THERAPY DIAG:  Dizziness and giddiness  Unsteadiness on feet  PERTINENT HISTORY: atrial fibrillation, mitral regurgitation, OSA, cerebral aneurysm x2 (stable- 4 x 85m right MCA bifurcation and 2 mm left PCOM -02/03/2020), polyneuropathy and history of right carotid dissection, COPD    PRECAUTIONS:  Fall and Other: atrial fibrillation, mitral regurgitation, OSA, cerebral aneurysm x2 (stable- 4 x 565mright MCA bifurcation and 2 mm left PCOM -02/03/2020), polyneuropathy and history of right carotid dissection   SUBJECTIVE: Exercises are going well, when performing x1 viewing and turns head to L he has double vision.  Has new great-grandson!  PAIN:  Are you having pain? No    OBJECTIVE:   VESTIBULAR TREATMENT:     Gaze Adaptation: Reviewed how pt is performing x1 viewing at home due to pt reporting diplopia with head turn to the L.   x1 Viewing Horizontal: Position: standing feet apart, Time: 60 seconds, Reps: 2, and Comment: No dizziness but demonstrated mild imbalance/sway when performing and x1 Viewing  Vertical:  Position: Standing feet apart, Time: 60 seconds, Reps: 2, and Comment: No dizziness, mild imbalance when performing  - when performing side to side cued pt to shorten ROM to avoid diplopia at end range when turning head to L.  Cued pt to increase speed of vertical head movement.  Continued to add to HEP focusing on functional LE strengthening, balance reactions/weight shifting and sensory integration.  Exercises reviewed with patient are below.  Gaze Stabilization - Standing Feet Apart   Feet shoulder width apart, keeping eyes on target on wall 3 feet away, tilt head down slightly and move head side to side for 60 seconds; shorten range of motion to avoid double vision. Repeat while moving head up and down for 60 seconds at slightly faster speed (think tick tock) Do 2-3 sessions per day.    Sit to Stand: Head Upright    With head upright, stand up pushing lightly with hands.  When sitting back down, keep hands on thighs and sit down very SLOWLY letting  hips, knees and ankles bend. Repeat __10__ times per session. Do __2__ sessions per day.     Feet Together, Head Motion - Eyes Closed      With eyes closed and feet together, move head slowly, up and down 10 times.  Rest and then repeat moving head side to side 10 times, slowly.   Repeat 2 times per session. Do 2 sessions per day.     Weight Shift: Diagonal    Stand holding a chair with one hand.  One foot forward, one foot back.  Slowly shift weight forward over right leg by pushing through left toes, lifting left heel. Shift backward over opposite leg, lifting toes of front foot.  Repeat 10 times with right foot forward, switch and repeat with left foot forward. Do __2__ sessions per day.     Hip Abduction (Standing)    Stand with support. Lift right leg out to side, keeping toe forward - imagine you are pushing a heavy box away.  Repeat 10 times to the right. Repeat with the left leg.  Try to keep your  chest up tall and hips level. Repeat _10__ times. Do __2_ times a day.   PATIENT EDUCATION: Education details: Updated and added to HEP Person educated: Patient Education method: Education officer, environmental, Handout Education comprehension: verbalized understanding, return demonstration   ASSESSMENT:   CLINICAL IMPRESSION:  With modifications to VOR x1 viewing pt reported resolution of diplopia.  Continued to add to patient's HEP focusing on functional LE strengthening and weight shift training to improve balance reactions.  Also included balance training with vision removed for sensory integration training.  Will continue to address and progress towards LTG.   REHAB POTENTIAL: Good   CLINICAL DECISION MAKING: Stable/uncomplicated   EVALUATION COMPLEXITY: Low     GOALS: Goals reviewed with patient? Yes   SHORT TERM GOALS:    STG Name Target Date Goal status  1 Patient will undergo FGA and LTG to be set as applicable Baseline:  0/37/04 MET    LONG TERM GOALS:   LTG Name Target Date Goal status  1 Patient will be independent with vestibular/balance HEP Baseline: 07/05/21 INITIAL  2 Patient will improve DVA to </= 2 line difference to demonstrate improved VOR Baseline: 4 line difference 07/05/21 INITIAL  3 Pt will demonstrate 4 point improvement in FGA to indicate decreased falls risk Baseline: 22/30 07/05/21 INITIAL  4 Patient will improve DFS >/= 59 , and DPS >/= 62 Baseline: 07/05/21 INITIAL    PLAN: PT FREQUENCY: 1x/week   PT DURATION: 8 weeks   PLANNED INTERVENTIONS: Therapeutic exercises, Therapeutic activity, Neuro Muscular re-education, Balance training, Gait training, Patient/Family education, Joint mobilization, Stair training, Vestibular training, Canalith repositioning, Orthotic/Fit training and DME instructions   PLAN FOR NEXT SESSION:  Gastroc stretches.  Continue to Progress VOR x 1.  Continue to work on Balance, especially narrow BOS and eyes closed;  neuropathy - hip strengthening for balance reactions.    Rico Junker, PT, DPT 05/30/21    9:14 PM  [facility] [address] [place], [place], [address] Phone: [phone]   Fax:  [phone]  Patient name: Maurice Wood MRN: [id number] DOB: January 28, 1933

## 2021-05-31 DIAGNOSIS — H52203 Unspecified astigmatism, bilateral: Secondary | ICD-10-CM | POA: Diagnosis not present

## 2021-05-31 DIAGNOSIS — Z961 Presence of intraocular lens: Secondary | ICD-10-CM | POA: Diagnosis not present

## 2021-05-31 DIAGNOSIS — H35352 Cystoid macular degeneration, left eye: Secondary | ICD-10-CM | POA: Diagnosis not present

## 2021-06-03 DIAGNOSIS — G4733 Obstructive sleep apnea (adult) (pediatric): Secondary | ICD-10-CM | POA: Diagnosis not present

## 2021-06-04 NOTE — Therapy (Signed)
OUTPATIENT PHYSICAL THERAPY VESTIBULAR TREATMENT NOTE   Patient Name: Maurice Wood MRN: [id number] DOB:07/07/1933, 86 y.o., male Today's Date: 06/05/2021  PCP: Antony Contras, MD REFERRING PROVIDER: Pieter Partridge, DO   PT End of Session - 06/05/21 1449     Visit Number 4    Number of Visits 9    Date for PT Re-Evaluation 07/05/21   POC for 8 weeks   Authorization Type HTA (10th Visit PN)    Progress Note Due on Visit 10    PT Start Time 1446    PT Stop Time 3748    PT Time Calculation (min) 44 min    Activity Tolerance Patient tolerated treatment well    Behavior During Therapy [facility] for tasks assessed/performed               Past Medical History:  Diagnosis Date   Allergy    rhinitis   Atrial fibrillation ([place]) Jan 2007   echo 1-07 normal ejection fraction, no significant valvular disease. adenosine cardiolite 1-07  no evidence of ischemia. A 48 hour Holter monitor in 7-07 showed good rate controlw/ chronic atrial fibrillation. Cath 1/11 normal cors. EF 45%. Echo 2-11 60-65%   Benign prostatic hypertrophy    Cerebral aneurysm    followed by Dr Estanislado Pandy   CHF (congestive heart failure) (HCC)    COPD (chronic obstructive pulmonary disease) (HCC)    Fatigue    History of chest pain    a. s/p LHC 05/2014 with normal cors   Hx of colonic polyps    diverticulosis   Hyperlipidemia    IBS (irritable bowel syndrome)    Incontinence    Per pt 12/14/11   Kidney stones    Obesity    Obstructive sleep apnea    noncompliant with CPAP   Retinal vein occlusion    Past Surgical History:  Procedure Laterality Date   CARDIAC CATHETERIZATION  1999   CHOLECYSTECTOMY     HERNIA REPAIR     inguinal and umbilical herniorrhaphy     INTERSTIM IMPLANT PLACEMENT  04/2017   LEFT HEART CATHETERIZATION WITH CORONARY ANGIOGRAM N/A 06/01/2014   Procedure: LEFT HEART CATHETERIZATION WITH CORONARY ANGIOGRAM;  Surgeon: Peter M Martinique, MD;  Location: [facility] CATH LAB;  Service: Cardiovascular;   Laterality: N/A;   LITHOTRIPSY     RIGHT/LEFT HEART CATH AND CORONARY ANGIOGRAPHY N/A 07/08/2020   Procedure: RIGHT/LEFT HEART CATH AND CORONARY ANGIOGRAPHY;  Surgeon: Jolaine Artist, MD;  Location: [place] CV LAB;  Service: Cardiovascular;  Laterality: N/A;   TEE WITHOUT CARDIOVERSION N/A 07/05/2020   Procedure: TRANSESOPHAGEAL ECHOCARDIOGRAM (TEE);  Surgeon: Jolaine Artist, MD;  Location: [facility] ENDOSCOPY;  Service: Cardiovascular;  Laterality: N/A;   TONSILLECTOMY AND ADENOIDECTOMY  as a child   Patient Active Problem List   Diagnosis Date Noted   Chorioretinitis 02/01/2021   Chronic pain 02/01/2021   Insomnia 02/01/2021   Interstitial lung disease ([place]) 02/01/2021   Nausea 02/01/2021   Overactive bladder 02/01/2021   Polyneuropathy 02/01/2021   Pulmonary nodule 02/01/2021   Seasonal allergic rhinitis 02/01/2021   Vitamin D deficiency 02/01/2021   COPD, moderate ([place]) 08/08/2020   Body mass index (BMI) 28.0-28.9, adult 02/04/2020   Elevated blood-pressure reading, without diagnosis of hypertension 02/04/2020   Body mass index (BMI) 27.0-27.9, adult 01/18/2020   Bilateral impacted cerumen 12/21/2019   Pain due to onychomycosis of toenails of both feet 05/26/2019   Coagulation disorder ([place]) 05/26/2019   Diabetic neuropathy ([place]) 05/26/2019  Pain in joint of right shoulder 08/13/2018   Left ear pain 06/05/2016   Sensorineural hearing loss (SNHL) of both ears 06/05/2016   Pseudophakia of both eyes 04/03/2016   PVD (posterior vitreous detachment), both eyes 04/03/2016   Bilateral lower extremity edema 06/02/2015   UTI (lower urinary tract infection) 05/25/2015   Sepsis ([place]) 05/25/2015   Fever 05/25/2015   Cough 05/25/2015   Kidney stone 05/25/2015   Hypokalemia 05/25/2015   Acute urinary retention    H/O cardiac catheterizationn 12/2009 with normal coronary arteries 05/30/2014   Cerebral arterial aneurysm 04/20/2014   Low back pain 12/17/2013   Chest pain  09/10/2013   Branch retinal vein occlusion 04/11/2012   Cystoid macular edema, left 04/11/2012   Epiretinal membrane, left 04/11/2012   Increased frequency of urination 10/08/2011   Urge incontinence 10/08/2011   Urinary incontinence 06/04/2011   Long term current use of anticoagulant 01/12/2011   DM type 2 (diabetes mellitus, type 2) ([place]) 07/31/2010   HEMORRHOIDS-INTERNAL 06/27/2010   Sleep apnea 06/27/2010   DIVERTICULOSIS OF COLON 01/03/2010   GERD 11/22/2009   IRRITABLE BOWEL SYNDROME 11/22/2009   ERECTILE DYSFUNCTION 06/06/2009   BPH (benign prostatic hyperplasia) 06/18/2008   PSA, INCREASED 12/19/2007   OBSTRUCTIVE SLEEP APNEA 07/24/2007   DISSECTION, CAROTID ARTERY 07/24/2007   TESTOSTERONE DEFICIENCY 07/23/2007   HLD (hyperlipidemia) 07/23/2007   COLONIC POLYPS, HX OF 07/23/2007   Atrial fibrillation, permanent ([place]) 07/14/2007    REFERRING DIAG: Vertigo  THERAPY DIAG:  Dizziness and giddiness  Unsteadiness on feet  PERTINENT HISTORY: atrial fibrillation, mitral regurgitation, OSA, cerebral aneurysm x2 (stable- 4 x 64m right MCA bifurcation and 2 mm left PCOM -02/03/2020), polyneuropathy and history of right carotid dissection, COPD    PRECAUTIONS:  Fall and Other: atrial fibrillation, mitral regurgitation, OSA, cerebral aneurysm x2 (stable- 4 x 515mright MCA bifurcation and 2 mm left PCOM -02/03/2020), polyneuropathy and history of right carotid dissection   SUBJECTIVE: Patient denies no new changes. Patient did report catching his R arm on screen door, has a skin tear and some bruising in the area. Will continue to monitor. Patient reports that exercises are going well today, does report some that are difficult. Would like to review.    PAIN:  Are you having pain? No   OBJECTIVE:   VESTIBULAR TREATMENT:     Completed standing with feet apart on foam with eyes open x 30 seconds; then progressed to addition of horizontal/vertical head turns with eyes open x 15  reps. Increased challenge intermittent with vertical > horizontal, CGA and intermittent UE support from // bars.   Standing with BUE support in // bars, completing alternating toe taps to 6" step slowly progressing from BUE to single UE support x 15 reps bilat. Then attempted without UE support but unable to complete. Need at least require fingertip support for completion. Increased challenge with SLS noted.   Standing with BUE support: completed standing hip extension bilat x 10 reps each, increased challenge with maintain extension with RLE requiring verbal cues from PT. Then completed hip abduction bilat x 10 reps, cues for slowed and controlled pace with completion.    Completed review of HEP additions below,per patient request:    Feet Together, Head Motion - Eyes Closed      With eyes closed and feet together, move head slowly, up and down 10 times.  Rest and then repeat moving head side to side 10 times, slowly.   Repeat 2 times per session. Do 2  sessions per day.  Weight Shift: Diagonal    Stand holding a chair with one hand.  One foot forward, one foot back.  Slowly shift weight forward over right leg by pushing through left toes, lifting left heel. Shift backward over opposite leg, lifting toes of front foot.  Repeat 10 times with right foot forward, switch and repeat with left foot forward. Do __2__ sessions per day.     Completed Calf (Gastroc) Stretch with patient. Completed seated with belt bilat x 30 seconds, cues for proper completion. Then trialed standing gastroc stretch at countertop, completed bilat x 30 seconds. Improved stretch and form with standing therefore added this exercise to HEP.   Calf Stretch    Place one leg forward, bent, other leg behind and straight. Lean forward keeping back heel flat. Hold 30 seconds while counting out loud. Repeat with other leg forward. Repeat 3 times. Do 1 sessions per day.  Copyright  VHI. All rights reserved.     PATIENT EDUCATION: Education details: HEP Review; Addition of Press photographer Person educated: Patient Education method: Explanation, Demonstration, Handout Education comprehension: verbalized understanding, return demonstration   ASSESSMENT:   CLINICAL IMPRESSION:  Completed review of exercises included on HEP providing intermittent cues for proper completion. Continued progression of balance exercises focused on progressing to complaint surfaces and SLS activities. Increased challenge, mainly requiring UE support throughout completion with activities. Added gastroc stretch to HEP. Will continue to progress toward all LTGs.      REHAB POTENTIAL: Good   CLINICAL DECISION MAKING: Stable/uncomplicated   EVALUATION COMPLEXITY: Low     GOALS: Goals reviewed with patient? Yes   SHORT TERM GOALS:    STG Name Target Date Goal status  1 Patient will undergo FGA and LTG to be set as applicable Baseline:  5/70/17 MET    LONG TERM GOALS:   LTG Name Target Date Goal status  1 Patient will be independent with vestibular/balance HEP Baseline: 07/05/21 INITIAL  2 Patient will improve DVA to </= 2 line difference to demonstrate improved VOR Baseline: 4 line difference 07/05/21 INITIAL  3 Pt will demonstrate 4 point improvement in FGA to indicate decreased falls risk Baseline: 22/30 07/05/21 INITIAL  4 Patient will improve DFS >/= 59 , and DPS >/= 62 Baseline: 07/05/21 INITIAL    PLAN: PT FREQUENCY: 1x/week   PT DURATION: 8 weeks   PLANNED INTERVENTIONS: Therapeutic exercises, Therapeutic activity, Neuro Muscular re-education, Balance training, Gait training, Patient/Family education, Joint mobilization, Stair training, Vestibular training, Canalith repositioning, Orthotic/Fit training and DME instructions   PLAN FOR NEXT SESSION:  How was the stretch addition? Continue to Progress VOR x 1.  Continue to work on Balance, especially narrow BOS and eyes closed; neuropathy - hip  strengthening for balance reactions.    [place], PT, DPT 06/05/21    3:39 PM  [facility] [address] [place], [place], [address] Phone: [phone]   Fax:  [phone]  Patient name: Maurice Wood MRN: [id number] DOB: Oct 24, 1933

## 2021-06-05 ENCOUNTER — Other Ambulatory Visit: Payer: Self-pay

## 2021-06-05 ENCOUNTER — Ambulatory Visit: Payer: PPO

## 2021-06-05 DIAGNOSIS — R42 Dizziness and giddiness: Secondary | ICD-10-CM

## 2021-06-05 DIAGNOSIS — R2681 Unsteadiness on feet: Secondary | ICD-10-CM

## 2021-06-05 NOTE — Patient Instructions (Signed)
Calf Stretch    Place one leg forward, bent, other leg behind and straight. Lean forward keeping back heel flat. Hold 30 seconds while counting out loud. Repeat with other leg forward. Repeat 3 times. Do 1 sessions per day.  Copyright  VHI. All rights reserved.

## 2021-06-08 DIAGNOSIS — Z85828 Personal history of other malignant neoplasm of skin: Secondary | ICD-10-CM | POA: Diagnosis not present

## 2021-06-08 DIAGNOSIS — L570 Actinic keratosis: Secondary | ICD-10-CM | POA: Diagnosis not present

## 2021-06-08 DIAGNOSIS — L821 Other seborrheic keratosis: Secondary | ICD-10-CM | POA: Diagnosis not present

## 2021-06-11 DIAGNOSIS — H109 Unspecified conjunctivitis: Secondary | ICD-10-CM | POA: Diagnosis not present

## 2021-06-14 NOTE — Therapy (Signed)
OUTPATIENT PHYSICAL THERAPY VESTIBULAR TREATMENT NOTE   Patient Name: Maurice Wood MRN: [id number] DOB:05-15-1933, 87 y.o., male Today's Date: 06/15/2021  PCP: Antony Contras, MD REFERRING PROVIDER: Antony Contras, MD   PT End of Session - 06/15/21 1234     Visit Number 5    Number of Visits 9    Date for PT Re-Evaluation 07/05/21   POC for 8 weeks   Authorization Type HTA (10th Visit PN)    Progress Note Due on Visit 10    PT Start Time 1232    PT Stop Time 7741    PT Time Calculation (min) 41 min    Activity Tolerance Patient tolerated treatment well    Behavior During Therapy [facility] for tasks assessed/performed                Past Medical History:  Diagnosis Date   Allergy    rhinitis   Atrial fibrillation ([place]) Jan 2007   echo 1-07 normal ejection fraction, no significant valvular disease. adenosine cardiolite 1-07  no evidence of ischemia. A 48 hour Holter monitor in 7-07 showed good rate controlw/ chronic atrial fibrillation. Cath 1/11 normal cors. EF 45%. Echo 2-11 60-65%   Benign prostatic hypertrophy    Cerebral aneurysm    followed by Dr Estanislado Pandy   CHF (congestive heart failure) (HCC)    COPD (chronic obstructive pulmonary disease) (HCC)    Fatigue    History of chest pain    a. s/p LHC 05/2014 with normal cors   Hx of colonic polyps    diverticulosis   Hyperlipidemia    IBS (irritable bowel syndrome)    Incontinence    Per pt 12/14/11   Kidney stones    Obesity    Obstructive sleep apnea    noncompliant with CPAP   Retinal vein occlusion    Past Surgical History:  Procedure Laterality Date   CARDIAC CATHETERIZATION  1999   CHOLECYSTECTOMY     HERNIA REPAIR     inguinal and umbilical herniorrhaphy     INTERSTIM IMPLANT PLACEMENT  04/2017   LEFT HEART CATHETERIZATION WITH CORONARY ANGIOGRAM N/A 06/01/2014   Procedure: LEFT HEART CATHETERIZATION WITH CORONARY ANGIOGRAM;  Surgeon: Peter M Martinique, MD;  Location: [name] CATH LAB;  Service: Cardiovascular;   Laterality: N/A;   LITHOTRIPSY     RIGHT/LEFT HEART CATH AND CORONARY ANGIOGRAPHY N/A 07/08/2020   Procedure: RIGHT/LEFT HEART CATH AND CORONARY ANGIOGRAPHY;  Surgeon: Jolaine Artist, MD;  Location: [place] CV LAB;  Service: Cardiovascular;  Laterality: N/A;   TEE WITHOUT CARDIOVERSION N/A 07/05/2020   Procedure: TRANSESOPHAGEAL ECHOCARDIOGRAM (TEE);  Surgeon: Jolaine Artist, MD;  Location: [facility] ENDOSCOPY;  Service: Cardiovascular;  Laterality: N/A;   TONSILLECTOMY AND ADENOIDECTOMY  as a child   Patient Active Problem List   Diagnosis Date Noted   Chorioretinitis 02/01/2021   Chronic pain 02/01/2021   Insomnia 02/01/2021   Interstitial lung disease ([place]) 02/01/2021   Nausea 02/01/2021   Overactive bladder 02/01/2021   Polyneuropathy 02/01/2021   Pulmonary nodule 02/01/2021   Seasonal allergic rhinitis 02/01/2021   Vitamin D deficiency 02/01/2021   COPD, moderate ([place]) 08/08/2020   Body mass index (BMI) 28.0-28.9, adult 02/04/2020   Elevated blood-pressure reading, without diagnosis of hypertension 02/04/2020   Body mass index (BMI) 27.0-27.9, adult 01/18/2020   Bilateral impacted cerumen 12/21/2019   Pain due to onychomycosis of toenails of both feet 05/26/2019   Coagulation disorder ([place]) 05/26/2019   Diabetic neuropathy ([place]) 05/26/2019  Pain in joint of right shoulder 08/13/2018   Left ear pain 06/05/2016   Sensorineural hearing loss (SNHL) of both ears 06/05/2016   Pseudophakia of both eyes 04/03/2016   PVD (posterior vitreous detachment), both eyes 04/03/2016   Bilateral lower extremity edema 06/02/2015   UTI (lower urinary tract infection) 05/25/2015   Sepsis ([place]) 05/25/2015   Fever 05/25/2015   Cough 05/25/2015   Kidney stone 05/25/2015   Hypokalemia 05/25/2015   Acute urinary retention    H/O cardiac catheterizationn 12/2009 with normal coronary arteries 05/30/2014   Cerebral arterial aneurysm 04/20/2014   Low back pain 12/17/2013   Chest pain  09/10/2013   Branch retinal vein occlusion 04/11/2012   Cystoid macular edema, left 04/11/2012   Epiretinal membrane, left 04/11/2012   Increased frequency of urination 10/08/2011   Urge incontinence 10/08/2011   Urinary incontinence 06/04/2011   Long term current use of anticoagulant 01/12/2011   DM type 2 (diabetes mellitus, type 2) ([place]) 07/31/2010   HEMORRHOIDS-INTERNAL 06/27/2010   Sleep apnea 06/27/2010   DIVERTICULOSIS OF COLON 01/03/2010   GERD 11/22/2009   IRRITABLE BOWEL SYNDROME 11/22/2009   ERECTILE DYSFUNCTION 06/06/2009   BPH (benign prostatic hyperplasia) 06/18/2008   PSA, INCREASED 12/19/2007   OBSTRUCTIVE SLEEP APNEA 07/24/2007   DISSECTION, CAROTID ARTERY 07/24/2007   TESTOSTERONE DEFICIENCY 07/23/2007   HLD (hyperlipidemia) 07/23/2007   COLONIC POLYPS, HX OF 07/23/2007   Atrial fibrillation, permanent ([place]) 07/14/2007    REFERRING DIAG: Vertigo  THERAPY DIAG:  Dizziness and giddiness  Unsteadiness on feet  PERTINENT HISTORY: atrial fibrillation, mitral regurgitation, OSA, cerebral aneurysm x2 (stable- 4 x 27m right MCA bifurcation and 2 mm left PCOM -02/03/2020), polyneuropathy and history of right carotid dissection, COPD    PRECAUTIONS:  Fall and Other: atrial fibrillation, mitral regurgitation, OSA, cerebral aneurysm x2 (stable- 4 x 574mright MCA bifurcation and 2 mm left PCOM -02/03/2020), polyneuropathy and history of right carotid dissection   SUBJECTIVE: Patient reports that he has had some pain in his R Shoulder, reports he is going to be following up with orthopedic doctor. Patient has also reports a conjunctivitis but this is doing much better. No falls to report.   PAIN:  Are you having pain? R Shoulder; 9/10; Sharp. Aggravating Factors: Movement (Overhead); Ice/Tylenol has helped the pain.    OBJECTIVE:   VESTIBULAR TREATMENT:  Due to patient reports of pain in RUE support with sit <> stands when using BUE, completed sit <> stands from mat  with LUE only using to push x 10 reps. Patient demonstrate no imbalance and able to complete appropriately. PT educating to use single UE support at this time due to pain. Patient verbalize understanding.   Completed standing balance on airex with wide BOS: completed EO and horizontal/vertical head turns x 10 reps each direction, then with EC static stance 3 x 20-25 seconds. Increased postural sway with eyes closed, often posterior sway noted. PT educating on improved weight shift anterior to promote balance strategies.  Wide BOS on firm surface, completed eyes closed and horizontal/vertical head turns to challenge vestibular system, completed x 10 reps each direction. Increased challenge/postural sway noted with vertical > horizontal.    Gaze Adaptation: x1 Viewing Horizontal: Position: standing feet apart, Time: 60 seconds, Reps: 2, and Comment: No dizziness when performing and x1 Viewing Vertical:  Position: Standing feet apart, Time: 60 seconds, Reps: 2, and Comment: No dizziness  Completed standing hip abduction in // bars with light UE support: completed x 10 reps bilaterally.  Proper form utilized, but patient continue to report mild discomfort in hip region with completion. Trialed side stepping to further target hip abductors with reduced pain, completed x 1 lap, then progressed to resistance (red theraband) x 1 lap. Patient tolerating well with improvements in pain noted. Added to HEP, see below:   Band Walk: Side Stepping    Tie red band around legs, just above knees. Step 10-15 feet to one side, then step back to start. Repeat 4 times per session. Use light support from countertop for support.  Copyright  VHI. All rights reserved.     PATIENT EDUCATION: Education details: Continue with HEP, Replaced Hip Abduction with Resisted Side Stepping Person educated: Patient Education method: Explanation, Demonstration, Handout Education comprehension: verbalized understanding, return  demonstration   ASSESSMENT:   CLINICAL IMPRESSION:  Today's skilled PT session focused on continued progression of balance exercies and strengthening for balance strategies. Patient reporting increased discomfort with standing hip abduction, updated HEP and replaced with resisted side stepping for improved tolerance and hip abductor strength. Will continue to progress toward all LTGs.      REHAB POTENTIAL: Good   CLINICAL DECISION MAKING: Stable/uncomplicated   EVALUATION COMPLEXITY: Low     GOALS: Goals reviewed with patient? Yes   SHORT TERM GOALS:    STG Name Target Date Goal status  1 Patient will undergo FGA and LTG to be set as applicable Baseline:  0/51/10 MET    LONG TERM GOALS:   LTG Name Target Date Goal status  1 Patient will be independent with vestibular/balance HEP Baseline: 07/05/21 INITIAL  2 Patient will improve DVA to </= 2 line difference to demonstrate improved VOR Baseline: 4 line difference 07/05/21 INITIAL  3 Pt will demonstrate 4 point improvement in FGA to indicate decreased falls risk Baseline: 22/30 07/05/21 INITIAL  4 Patient will improve DFS >/= 59 , and DPS >/= 62 Baseline: 07/05/21 INITIAL    PLAN: PT FREQUENCY: 1x/week   PT DURATION: 8 weeks   PLANNED INTERVENTIONS: Therapeutic exercises, Therapeutic activity, Neuro Muscular re-education, Balance training, Gait training, Patient/Family education, Joint mobilization, Stair training, Vestibular training, Canalith repositioning, Orthotic/Fit training and DME instructions   PLAN FOR NEXT SESSION:  How was side stepping? Update on shoulder? Continue to Progress VOR x 1.  Continue to work on Balance, especially narrow BOS and eyes closed; neuropathy - hip strengthening for balance reactions. Rockerboard activities.   Jones Bales, PT, DPT 06/15/21    1:39 PM  [facility] [address] [place], [place], [address] Phone: [phone]    Fax:  [phone]  Patient name: Maurice Wood MRN: [id number] DOB: 20-Sep-1933

## 2021-06-15 ENCOUNTER — Ambulatory Visit: Payer: PPO | Attending: Neurology

## 2021-06-15 ENCOUNTER — Other Ambulatory Visit: Payer: Self-pay

## 2021-06-15 DIAGNOSIS — R42 Dizziness and giddiness: Secondary | ICD-10-CM | POA: Insufficient documentation

## 2021-06-15 DIAGNOSIS — R2681 Unsteadiness on feet: Secondary | ICD-10-CM | POA: Diagnosis not present

## 2021-06-19 ENCOUNTER — Other Ambulatory Visit: Payer: Self-pay

## 2021-06-19 ENCOUNTER — Ambulatory Visit (INDEPENDENT_AMBULATORY_CARE_PROVIDER_SITE_OTHER)
Admission: RE | Admit: 2021-06-19 | Discharge: 2021-06-19 | Disposition: A | Discharge status: Home / Self Care | Payer: PPO | Source: Ambulatory Visit | Attending: Primary Care | Admitting: Primary Care

## 2021-06-19 DIAGNOSIS — J849 Interstitial pulmonary disease, unspecified: Secondary | ICD-10-CM | POA: Diagnosis not present

## 2021-06-19 DIAGNOSIS — I700 Atherosclerosis of aorta: Secondary | ICD-10-CM | POA: Diagnosis not present

## 2021-06-19 DIAGNOSIS — R911 Solitary pulmonary nodule: Secondary | ICD-10-CM | POA: Diagnosis not present

## 2021-06-19 DIAGNOSIS — J439 Emphysema, unspecified: Secondary | ICD-10-CM | POA: Diagnosis not present

## 2021-06-20 ENCOUNTER — Ambulatory Visit: Payer: PPO

## 2021-06-20 DIAGNOSIS — R2681 Unsteadiness on feet: Secondary | ICD-10-CM

## 2021-06-20 DIAGNOSIS — R42 Dizziness and giddiness: Secondary | ICD-10-CM

## 2021-06-20 NOTE — Therapy (Signed)
OUTPATIENT PHYSICAL THERAPY VESTIBULAR TREATMENT NOTE   Patient Name: Maurice Wood MRN: [id number] DOB:04/12/1933, 87 y.o., male Today's Date: 06/20/2021  PCP: Antony Contras, MD REFERRING PROVIDER: Pieter Partridge, DO   PT End of Session - 06/20/21 1446     Visit Number 6    Number of Visits 9    Date for PT Re-Evaluation 07/05/21   POC for 8 weeks   Authorization Type HTA (10th Visit PN)    Progress Note Due on Visit 10    PT Start Time 1447    PT Stop Time 1528    PT Time Calculation (min) 41 min    Activity Tolerance Patient tolerated treatment well    Behavior During Therapy [facility] for tasks assessed/performed                 Past Medical History:  Diagnosis Date   Allergy    rhinitis   Atrial fibrillation ([place]) Jan 2007   echo 1-07 normal ejection fraction, no significant valvular disease. adenosine cardiolite 1-07  no evidence of ischemia. A 48 hour Holter monitor in 7-07 showed good rate controlw/ chronic atrial fibrillation. Cath 1/11 normal cors. EF 45%. Echo 2-11 60-65%   Benign prostatic hypertrophy    Cerebral aneurysm    followed by Dr Estanislado Pandy   CHF (congestive heart failure) (HCC)    COPD (chronic obstructive pulmonary disease) (HCC)    Fatigue    History of chest pain    a. s/p LHC 05/2014 with normal cors   Hx of colonic polyps    diverticulosis   Hyperlipidemia    IBS (irritable bowel syndrome)    Incontinence    Per pt 12/14/11   Kidney stones    Obesity    Obstructive sleep apnea    noncompliant with CPAP   Retinal vein occlusion    Past Surgical History:  Procedure Laterality Date   CARDIAC CATHETERIZATION  1999   CHOLECYSTECTOMY     HERNIA REPAIR     inguinal and umbilical herniorrhaphy     INTERSTIM IMPLANT PLACEMENT  04/2017   LEFT HEART CATHETERIZATION WITH CORONARY ANGIOGRAM N/A 06/01/2014   Procedure: LEFT HEART CATHETERIZATION WITH CORONARY ANGIOGRAM;  Surgeon: Peter M Martinique, MD;  Location: [facility] CATH LAB;  Service: Cardiovascular;   Laterality: N/A;   LITHOTRIPSY     RIGHT/LEFT HEART CATH AND CORONARY ANGIOGRAPHY N/A 07/08/2020   Procedure: RIGHT/LEFT HEART CATH AND CORONARY ANGIOGRAPHY;  Surgeon: Jolaine Artist, MD;  Location: [place] CV LAB;  Service: Cardiovascular;  Laterality: N/A;   TEE WITHOUT CARDIOVERSION N/A 07/05/2020   Procedure: TRANSESOPHAGEAL ECHOCARDIOGRAM (TEE);  Surgeon: Jolaine Artist, MD;  Location: [facility] ENDOSCOPY;  Service: Cardiovascular;  Laterality: N/A;   TONSILLECTOMY AND ADENOIDECTOMY  as a child   Patient Active Problem List   Diagnosis Date Noted   Chorioretinitis 02/01/2021   Chronic pain 02/01/2021   Insomnia 02/01/2021   Interstitial lung disease ([place]) 02/01/2021   Nausea 02/01/2021   Overactive bladder 02/01/2021   Polyneuropathy 02/01/2021   Pulmonary nodule 02/01/2021   Seasonal allergic rhinitis 02/01/2021   Vitamin D deficiency 02/01/2021   COPD, moderate ([place]) 08/08/2020   Body mass index (BMI) 28.0-28.9, adult 02/04/2020   Elevated blood-pressure reading, without diagnosis of hypertension 02/04/2020   Body mass index (BMI) 27.0-27.9, adult 01/18/2020   Bilateral impacted cerumen 12/21/2019   Pain due to onychomycosis of toenails of both feet 05/26/2019   Coagulation disorder ([place]) 05/26/2019   Diabetic neuropathy ([place]) 05/26/2019  Pain in joint of right shoulder 08/13/2018   Left ear pain 06/05/2016   Sensorineural hearing loss (SNHL) of both ears 06/05/2016   Pseudophakia of both eyes 04/03/2016   PVD (posterior vitreous detachment), both eyes 04/03/2016   Bilateral lower extremity edema 06/02/2015   UTI (lower urinary tract infection) 05/25/2015   Sepsis ([place]) 05/25/2015   Fever 05/25/2015   Cough 05/25/2015   Kidney stone 05/25/2015   Hypokalemia 05/25/2015   Acute urinary retention    H/O cardiac catheterizationn 12/2009 with normal coronary arteries 05/30/2014   Cerebral arterial aneurysm 04/20/2014   Low back pain 12/17/2013   Chest pain  09/10/2013   Branch retinal vein occlusion 04/11/2012   Cystoid macular edema, left 04/11/2012   Epiretinal membrane, left 04/11/2012   Increased frequency of urination 10/08/2011   Urge incontinence 10/08/2011   Urinary incontinence 06/04/2011   Long term current use of anticoagulant 01/12/2011   DM type 2 (diabetes mellitus, type 2) ([place]) 07/31/2010   HEMORRHOIDS-INTERNAL 06/27/2010   Sleep apnea 06/27/2010   DIVERTICULOSIS OF COLON 01/03/2010   GERD 11/22/2009   IRRITABLE BOWEL SYNDROME 11/22/2009   ERECTILE DYSFUNCTION 06/06/2009   BPH (benign prostatic hyperplasia) 06/18/2008   PSA, INCREASED 12/19/2007   OBSTRUCTIVE SLEEP APNEA 07/24/2007   DISSECTION, CAROTID ARTERY 07/24/2007   TESTOSTERONE DEFICIENCY 07/23/2007   HLD (hyperlipidemia) 07/23/2007   COLONIC POLYPS, HX OF 07/23/2007   Atrial fibrillation, permanent ([place]) 07/14/2007    REFERRING DIAG: Vertigo  THERAPY DIAG:  Dizziness and giddiness  Unsteadiness on feet  PERTINENT HISTORY: atrial fibrillation, mitral regurgitation, OSA, cerebral aneurysm x2 (stable- 4 x 30m right MCA bifurcation and 2 mm left PCOM -02/03/2020), polyneuropathy and history of right carotid dissection, COPD    PRECAUTIONS:  Fall and Other: atrial fibrillation, mitral regurgitation, OSA, cerebral aneurysm x2 (stable- 4 x 549mright MCA bifurcation and 2 mm left PCOM -02/03/2020), polyneuropathy and history of right carotid dissection   SUBJECTIVE: Patient reports that the side stepping gives a challenge but less pain that with kicks out to the side. Reports shoulder continues to bother him. No falls. No other new changes/complaints.    PAIN:  Are you having pain? R Shoulder, 8/10. Constant Ache. See's Orthopedic MD Thursday.   OBJECTIVE:   VESTIBULAR TREATMENT:  Standing with BUE support from chair completed gastroc stretch 2 x 30 seconds on BLE, cues to keep trunk upright and avoid forward lean.   Standing at bottom of stairs: completed  alternating toe taps to 1st step slowly progressing from BUE support > single UE support > no UE support completed 2 x 10 reps. Increased challenge with SLS on RLE, CGA required and cues for foot position as patient tend to migrate forward with increased steps.   Rockerboard positioned A/P: completed standing holding board steady 2 x 30 seconds; then with light UE support completed horizontal/vertical head turns x 10 reps each direction. Increased challenge with vertical > horizontal.  Wide BOS on firm surface, completed eyes closed and horizontal/vertical head turns to challenge vestibular system, completed x 10 reps each direction. Increased challenge/postural sway noted with vertical > horizontal. Standing on airex with wide BOS completed eyes closed 3 x 30 seconds, and then with EO and narrow BOS completed horizontal/vertical head turns x 10 reps each direction. Intermittent UE support due to postural sway.    Gaze Adaptation: x1 Viewing Horizontal: Position: standing feet apart > feet together, Time: 60 seconds, Reps: 2, and Comment: No dizziness when performing, mild postural sway  with feet together and x1 Viewing Vertical:  Position: Standing feet apart > feet together , Time: 60 seconds, Reps: 2, and Comment: No dizziness, mild postural sway with feet together  In // bars with BUE completed alternating marching with red theraband, cues for improved hip/knee flexion x 3 reps down and back, followed by backwards walking x 3 laps down and back with red theraband, cues for step length.  Completed review of side stepping addition to HEP, able to complete with red band with minimal cues x 2 laps down and back in // bars. Cues to keep toes pointed forward to promote proper muscle recruitment  Band Walk: Side Stepping    Tie red band around legs, just above knees. Step 10-15 feet to one side, then step back to start. Repeat 4 times per session. Use light support from countertop for  support.  Copyright  VHI. All rights reserved.     PATIENT EDUCATION: Education details: Continue with HEP Person educated: Patient Education method: Explanation, Demonstration, Handout Education comprehension: verbalized understanding, return demonstration   ASSESSMENT:   CLINICAL IMPRESSION:  Today's skilled PT session focused on continued progression of VOR x 1 to standing narrow BOS with increased postural sway, but no change in dizziness. Rest of session focused on high level balance and head turns on complaint surfaces to further improve vestibular input. Patient tolerating well. Intermittent rest breaks required. Will continue to progress toward all LTGs.     REHAB POTENTIAL: Good   CLINICAL DECISION MAKING: Stable/uncomplicated   EVALUATION COMPLEXITY: Low     GOALS: Goals reviewed with patient? Yes   SHORT TERM GOALS:    STG Name Target Date Goal status  1 Patient will undergo FGA and LTG to be set as applicable Baseline:  4/37/00 MET    LONG TERM GOALS:   LTG Name Target Date Goal status  1 Patient will be independent with vestibular/balance HEP Baseline: 07/05/21 INITIAL  2 Patient will improve DVA to </= 2 line difference to demonstrate improved VOR Baseline: 4 line difference 07/05/21 INITIAL  3 Pt will demonstrate 4 point improvement in FGA to indicate decreased falls risk Baseline: 22/30 07/05/21 INITIAL  4 Patient will improve DFS >/= 59 , and DPS >/= 62 Baseline: 07/05/21 INITIAL    PLAN: PT FREQUENCY: 1x/week   PT DURATION: 8 weeks   PLANNED INTERVENTIONS: Therapeutic exercises, Therapeutic activity, Neuro Muscular re-education, Balance training, Gait training, Patient/Family education, Joint mobilization, Stair training, Vestibular training, Canalith repositioning, Orthotic/Fit training and DME instructions   PLAN FOR NEXT SESSION:   Update on shoulder? Continue to Progress VOR x 1.  Continue to work on Balance, especially narrow BOS and eyes closed;  neuropathy - hip strengthening for balance reactions. Rockerboard activities.   Jones Bales, PT, DPT 06/20/21    3:33 PM  [facility] [address] [place], [place], [address] Phone: [phone]   Fax:  [phone]  Patient name: PEPPER WYNDHAM MRN: [id number] DOB: Jan 19, 1933

## 2021-06-22 DIAGNOSIS — M25511 Pain in right shoulder: Secondary | ICD-10-CM | POA: Diagnosis not present

## 2021-06-27 ENCOUNTER — Ambulatory Visit: Payer: PPO

## 2021-06-27 DIAGNOSIS — M79672 Pain in left foot: Secondary | ICD-10-CM | POA: Diagnosis not present

## 2021-06-27 DIAGNOSIS — M25572 Pain in left ankle and joints of left foot: Secondary | ICD-10-CM | POA: Diagnosis not present

## 2021-06-29 DIAGNOSIS — M25511 Pain in right shoulder: Secondary | ICD-10-CM | POA: Diagnosis not present

## 2021-06-30 ENCOUNTER — Telehealth: Payer: Self-pay

## 2021-06-30 NOTE — Telephone Encounter (Signed)
Patient informed of results and recommendations

## 2021-06-30 NOTE — Telephone Encounter (Signed)
-----   Message from Martyn Ehrich, NP sent at 06/30/2021  9:07 AM EDT ----- CT chest showed no significant change, findings remain consistent with indeterminate for pulmonary fibrosis, minimal emphysema, CAD, moderate hiatal hernia. He has an apt on 8/8 with Dr. Halford Chessman and they can discuss further

## 2021-06-30 NOTE — Progress Notes (Signed)
CT chest showed no significant change, findings remain consistent with indeterminate for pulmonary fibrosis, minimal emphysema, CAD, moderate hiatal hernia. He has an apt on 8/8 with Dr. Halford Chessman and they can discuss further

## 2021-07-04 ENCOUNTER — Ambulatory Visit: Payer: PPO

## 2021-07-04 NOTE — Therapy (Deleted)
OUTPATIENT PHYSICAL THERAPY VESTIBULAR TREATMENT NOTE   Patient Name: Maurice Wood MRN: [id number] DOB:01-30-1933, 87 y.o., male Today's Date: 07/04/2021  PCP: Antony Contras, MD REFERRING PROVIDER: Antony Contras, MD         Past Medical History:  Diagnosis Date   Allergy    rhinitis   Atrial fibrillation [facility]) Jan 2007   echo 1-07 normal ejection fraction, no significant valvular disease. adenosine cardiolite 1-07  no evidence of ischemia. A 48 hour Holter monitor in 7-07 showed good rate controlw/ chronic atrial fibrillation. Cath 1/11 normal cors. EF 45%. Echo 2-11 60-65%   Benign prostatic hypertrophy    Cerebral aneurysm    followed by Dr Estanislado Pandy   CHF (congestive heart failure) (HCC)    COPD (chronic obstructive pulmonary disease) (HCC)    Fatigue    History of chest pain    a. s/p LHC 05/2014 with normal cors   Hx of colonic polyps    diverticulosis   Hyperlipidemia    IBS (irritable bowel syndrome)    Incontinence    Per pt 12/14/11   Kidney stones    Obesity    Obstructive sleep apnea    noncompliant with CPAP   Retinal vein occlusion    Past Surgical History:  Procedure Laterality Date   CARDIAC CATHETERIZATION  1999   CHOLECYSTECTOMY     HERNIA REPAIR     inguinal and umbilical herniorrhaphy     INTERSTIM IMPLANT PLACEMENT  04/2017   LEFT HEART CATHETERIZATION WITH CORONARY ANGIOGRAM N/A 06/01/2014   Procedure: LEFT HEART CATHETERIZATION WITH CORONARY ANGIOGRAM;  Surgeon: Peter M Martinique, MD;  Location: [facility] CATH LAB;  Service: Cardiovascular;  Laterality: N/A;   LITHOTRIPSY     RIGHT/LEFT HEART CATH AND CORONARY ANGIOGRAPHY N/A 07/08/2020   Procedure: RIGHT/LEFT HEART CATH AND CORONARY ANGIOGRAPHY;  Surgeon: Jolaine Artist, MD;  Location: [place] CV LAB;  Service: Cardiovascular;  Laterality: N/A;   TEE WITHOUT CARDIOVERSION N/A 07/05/2020   Procedure: TRANSESOPHAGEAL ECHOCARDIOGRAM (TEE);  Surgeon: Jolaine Artist, MD;  Location: [facility] ENDOSCOPY;   Service: Cardiovascular;  Laterality: N/A;   TONSILLECTOMY AND ADENOIDECTOMY  as a child   Patient Active Problem List   Diagnosis Date Noted   Chorioretinitis 02/01/2021   Chronic pain 02/01/2021   Insomnia 02/01/2021   Interstitial lung disease ([place]) 02/01/2021   Nausea 02/01/2021   Overactive bladder 02/01/2021   Polyneuropathy 02/01/2021   Pulmonary nodule 02/01/2021   Seasonal allergic rhinitis 02/01/2021   Vitamin D deficiency 02/01/2021   COPD, moderate ([place]) 08/08/2020   Body mass index (BMI) 28.0-28.9, adult 02/04/2020   Elevated blood-pressure reading, without diagnosis of hypertension 02/04/2020   Body mass index (BMI) 27.0-27.9, adult 01/18/2020   Bilateral impacted cerumen 12/21/2019   Pain due to onychomycosis of toenails of both feet 05/26/2019   Coagulation disorder ([place]) 05/26/2019   Diabetic neuropathy ([place]) 05/26/2019   Pain in joint of right shoulder 08/13/2018   Left ear pain 06/05/2016   Sensorineural hearing loss (SNHL) of both ears 06/05/2016   Pseudophakia of both eyes 04/03/2016   PVD (posterior vitreous detachment), both eyes 04/03/2016   Bilateral lower extremity edema 06/02/2015   UTI (lower urinary tract infection) 05/25/2015   Sepsis ([place]) 05/25/2015   Fever 05/25/2015   Cough 05/25/2015   Kidney stone 05/25/2015   Hypokalemia 05/25/2015   Acute urinary retention    H/O cardiac catheterizationn 12/2009 with normal coronary arteries 05/30/2014   Cerebral arterial aneurysm 04/20/2014   Low back  pain 12/17/2013   Chest pain 09/10/2013   Branch retinal vein occlusion 04/11/2012   Cystoid macular edema, left 04/11/2012   Epiretinal membrane, left 04/11/2012   Increased frequency of urination 10/08/2011   Urge incontinence 10/08/2011   Urinary incontinence 06/04/2011   Long term current use of anticoagulant 01/12/2011   DM type 2 (diabetes mellitus, type 2) ([place]) 07/31/2010   HEMORRHOIDS-INTERNAL 06/27/2010   Sleep apnea 06/27/2010    DIVERTICULOSIS OF COLON 01/03/2010   GERD 11/22/2009   IRRITABLE BOWEL SYNDROME 11/22/2009   ERECTILE DYSFUNCTION 06/06/2009   BPH (benign prostatic hyperplasia) 06/18/2008   PSA, INCREASED 12/19/2007   OBSTRUCTIVE SLEEP APNEA 07/24/2007   DISSECTION, CAROTID ARTERY 07/24/2007   TESTOSTERONE DEFICIENCY 07/23/2007   HLD (hyperlipidemia) 07/23/2007   COLONIC POLYPS, HX OF 07/23/2007   Atrial fibrillation, permanent ([place]) 07/14/2007    REFERRING DIAG: Vertigo  THERAPY DIAG:  No diagnosis found.  PERTINENT HISTORY: atrial fibrillation, mitral regurgitation, OSA, cerebral aneurysm x2 (stable- 4 x 20m right MCA bifurcation and 2 mm left PCOM -02/03/2020), polyneuropathy and history of right carotid dissection, COPD    PRECAUTIONS:  Fall and Other: atrial fibrillation, mitral regurgitation, OSA, cerebral aneurysm x2 (stable- 4 x 553mright MCA bifurcation and 2 mm left PCOM -02/03/2020), polyneuropathy and history of right carotid dissection   SUBJECTIVE: ***   PAIN:  Are you having pain?    OBJECTIVE:   VESTIBULAR TREATMENT:  Standing with BUE support from chair completed gastroc stretch 2 x 30 seconds on BLE, cues to keep trunk upright and avoid forward lean.   Standing at bottom of stairs: completed alternating toe taps to 1st step slowly progressing from BUE support > single UE support > no UE support completed 2 x 10 reps. Increased challenge with SLS on RLE, CGA required and cues for foot position as patient tend to migrate forward with increased steps.   Rockerboard positioned A/P: completed standing holding board steady 2 x 30 seconds; then with light UE support completed horizontal/vertical head turns x 10 reps each direction. Increased challenge with vertical > horizontal.  Wide BOS on firm surface, completed eyes closed and horizontal/vertical head turns to challenge vestibular system, completed x 10 reps each direction. Increased challenge/postural sway noted with vertical >  horizontal. Standing on airex with wide BOS completed eyes closed 3 x 30 seconds, and then with EO and narrow BOS completed horizontal/vertical head turns x 10 reps each direction. Intermittent UE support due to postural sway.    Gaze Adaptation: x1 Viewing Horizontal: Position: standing feet apart > feet together, Time: 60 seconds, Reps: 2, and Comment: No dizziness when performing, mild postural sway with feet together and x1 Viewing Vertical:  Position: Standing feet apart > feet together , Time: 60 seconds, Reps: 2, and Comment: No dizziness, mild postural sway with feet together  In // bars with BUE completed alternating marching with red theraband, cues for improved hip/knee flexion x 3 reps down and back, followed by backwards walking x 3 laps down and back with red theraband, cues for step length.  Completed review of side stepping addition to HEP, able to complete with red band with minimal cues x 2 laps down and back in // bars. Cues to keep toes pointed forward to promote proper muscle recruitment  Band Walk: Side Stepping    Tie red band around legs, just above knees. Step 10-15 feet to one side, then step back to start. Repeat 4 times per session. Use light support from countertop  for support.  Copyright  VHI. All rights reserved.     PATIENT EDUCATION: Education details: Progress toward LTG's Person educated: Patient Education method: Education officer, environmental, Handout Education comprehension: verbalized understanding, return demonstration   ASSESSMENT:   CLINICAL IMPRESSION:  Today's skilled PT session focused on assessment of patient's progress toward LTG's. Patient scored /30 on FGA indicating     REHAB POTENTIAL: Good   CLINICAL DECISION MAKING: Stable/uncomplicated   EVALUATION COMPLEXITY: Low     GOALS: Goals reviewed with patient? Yes   SHORT TERM GOALS:    STG Name Target Date Goal status  1 Patient will undergo FGA and LTG to be set as  applicable Baseline:  7/73/73 MET    LONG TERM GOALS:   LTG Name Target Date Goal status  1 Patient will be independent with vestibular/balance HEP Baseline: 07/05/21 INITIAL  2 Patient will improve DVA to </= 2 line difference to demonstrate improved VOR Baseline: 4 line difference 07/05/21 INITIAL  3 Pt will demonstrate 4 point improvement in FGA to indicate decreased falls risk Baseline: 22/30 07/05/21 INITIAL  4 Patient will improve DFS >/= 59 , and DPS >/= 62 Baseline: 07/05/21 INITIAL    PLAN: PT FREQUENCY: 1x/week   PT DURATION: 8 weeks   PLANNED INTERVENTIONS: Therapeutic exercises, Therapeutic activity, Neuro Muscular re-education, Balance training, Gait training, Patient/Family education, Joint mobilization, Stair training, Vestibular training, Canalith repositioning, Orthotic/Fit training and DME instructions   PLAN FOR NEXT SESSION:    Jones Bales, PT, DPT 07/04/21    8:26 AM  [facility] [address] [place], [place], [address] Phone: [phone]   Fax:  [phone]  Patient name: Maurice Wood MRN: [id number] DOB: 05-22-1933

## 2021-07-07 DIAGNOSIS — M25572 Pain in left ankle and joints of left foot: Secondary | ICD-10-CM | POA: Diagnosis not present

## 2021-07-13 ENCOUNTER — Other Ambulatory Visit: Payer: Self-pay

## 2021-07-13 ENCOUNTER — Ambulatory Visit: Payer: PPO | Attending: Neurology

## 2021-07-13 DIAGNOSIS — R2681 Unsteadiness on feet: Secondary | ICD-10-CM | POA: Insufficient documentation

## 2021-07-13 DIAGNOSIS — R42 Dizziness and giddiness: Secondary | ICD-10-CM | POA: Diagnosis not present

## 2021-07-13 NOTE — Therapy (Signed)
OUTPATIENT PHYSICAL THERAPY VESTIBULAR TREATMENT NOTE/DISCHARGE SUMMARY/RE-CERT TO COVER D/C VISIT   Patient Name: Maurice Wood MRN: [id number] DOB:20-Jan-1933, 87 y.o., male Today's Date: 07/13/2021  PCP: Antony Contras, MD REFERRING PROVIDER: Antony Contras, MD PHYSICAL THERAPY DISCHARGE SUMMARY  Visits from Start of Care: 7  Current functional level related to goals / functional outcomes: See Clinical Impression Statement   Remaining deficits: Mild Imbalance; Shoulder Pain   Education / Equipment: HEP Provided   Patient agrees to discharge. Patient goals were met. Patient is being discharged due to meeting the stated rehab goals.    PT End of Session - 07/13/21 1104     Visit Number 7    Number of Visits 9    Date for PT Re-Evaluation 07/05/21   POC for 8 weeks   Authorization Type HTA (10th Visit PN)    Progress Note Due on Visit 10    PT Start Time 1101    PT Stop Time 1129    PT Time Calculation (min) 28 min    Activity Tolerance Patient tolerated treatment well    Behavior During Therapy [facility] for tasks assessed/performed              Past Medical History:  Diagnosis Date   Allergy    rhinitis   Atrial fibrillation ([place]) Jan 2007   echo 1-07 normal ejection fraction, no significant valvular disease. adenosine cardiolite 1-07  no evidence of ischemia. A 48 hour Holter monitor in 7-07 showed good rate controlw/ chronic atrial fibrillation. Cath 1/11 normal cors. EF 45%. Echo 2-11 60-65%   Benign prostatic hypertrophy    Cerebral aneurysm    followed by Dr Estanislado Pandy   CHF (congestive heart failure) (HCC)    COPD (chronic obstructive pulmonary disease) (HCC)    Fatigue    History of chest pain    a. s/p LHC 05/2014 with normal cors   Hx of colonic polyps    diverticulosis   Hyperlipidemia    IBS (irritable bowel syndrome)    Incontinence    Per pt 12/14/11   Kidney stones    Obesity    Obstructive sleep apnea    noncompliant with CPAP   Retinal vein  occlusion    Past Surgical History:  Procedure Laterality Date   CARDIAC CATHETERIZATION  1999   CHOLECYSTECTOMY     HERNIA REPAIR     inguinal and umbilical herniorrhaphy     INTERSTIM IMPLANT PLACEMENT  04/2017   LEFT HEART CATHETERIZATION WITH CORONARY ANGIOGRAM N/A 06/01/2014   Procedure: LEFT HEART CATHETERIZATION WITH CORONARY ANGIOGRAM;  Surgeon: Peter M Martinique, MD;  Location: [facility] CATH LAB;  Service: Cardiovascular;  Laterality: N/A;   LITHOTRIPSY     RIGHT/LEFT HEART CATH AND CORONARY ANGIOGRAPHY N/A 07/08/2020   Procedure: RIGHT/LEFT HEART CATH AND CORONARY ANGIOGRAPHY;  Surgeon: Jolaine Artist, MD;  Location: [place] CV LAB;  Service: Cardiovascular;  Laterality: N/A;   TEE WITHOUT CARDIOVERSION N/A 07/05/2020   Procedure: TRANSESOPHAGEAL ECHOCARDIOGRAM (TEE);  Surgeon: Jolaine Artist, MD;  Location[facility] ENDOSCOPY;  Service: Cardiovascular;  Laterality: N/A;   TONSILLECTOMY AND ADENOIDECTOMY  as a child   Patient Active Problem List   Diagnosis Date Noted   Chorioretinitis 02/01/2021   Chronic pain 02/01/2021   Insomnia 02/01/2021   Interstitial lung disease ([place]) 02/01/2021   Nausea 02/01/2021   Overactive bladder 02/01/2021   Polyneuropathy 02/01/2021   Pulmonary nodule 02/01/2021   Seasonal allergic rhinitis 02/01/2021   Vitamin D deficiency 02/01/2021  COPD, moderate ([place]) 08/08/2020   Body mass index (BMI) 28.0-28.9, adult 02/04/2020   Elevated blood-pressure reading, without diagnosis of hypertension 02/04/2020   Body mass index (BMI) 27.0-27.9, adult 01/18/2020   Bilateral impacted cerumen 12/21/2019   Pain due to onychomycosis of toenails of both feet 05/26/2019   Coagulation disorder ([place]) 05/26/2019   Diabetic neuropathy ([place]) 05/26/2019   Pain in joint of right shoulder 08/13/2018   Left ear pain 06/05/2016   Sensorineural hearing loss (SNHL) of both ears 06/05/2016   Pseudophakia of both eyes 04/03/2016   PVD (posterior vitreous detachment),  both eyes 04/03/2016   Bilateral lower extremity edema 06/02/2015   UTI (lower urinary tract infection) 05/25/2015   Sepsis ([place]) 05/25/2015   Fever 05/25/2015   Cough 05/25/2015   Kidney stone 05/25/2015   Hypokalemia 05/25/2015   Acute urinary retention    H/O cardiac catheterizationn 12/2009 with normal coronary arteries 05/30/2014   Cerebral arterial aneurysm 04/20/2014   Low back pain 12/17/2013   Chest pain 09/10/2013   Branch retinal vein occlusion 04/11/2012   Cystoid macular edema, left 04/11/2012   Epiretinal membrane, left 04/11/2012   Increased frequency of urination 10/08/2011   Urge incontinence 10/08/2011   Urinary incontinence 06/04/2011   Long term current use of anticoagulant 01/12/2011   DM type 2 (diabetes mellitus, type 2) ([place]) 07/31/2010   HEMORRHOIDS-INTERNAL 06/27/2010   Sleep apnea 06/27/2010   DIVERTICULOSIS OF COLON 01/03/2010   GERD 11/22/2009   IRRITABLE BOWEL SYNDROME 11/22/2009   ERECTILE DYSFUNCTION 06/06/2009   BPH (benign prostatic hyperplasia) 06/18/2008   PSA, INCREASED 12/19/2007   OBSTRUCTIVE SLEEP APNEA 07/24/2007   DISSECTION, CAROTID ARTERY 07/24/2007   TESTOSTERONE DEFICIENCY 07/23/2007   HLD (hyperlipidemia) 07/23/2007   COLONIC POLYPS, HX OF 07/23/2007   Atrial fibrillation, permanent ([place]) 07/14/2007    REFERRING DIAG: Vertigo  THERAPY DIAG:  Dizziness and giddiness  Unsteadiness on feet  PERTINENT HISTORY: atrial fibrillation, mitral regurgitation, OSA, cerebral aneurysm x2 (stable- 4 x 8m right MCA bifurcation and 2 mm left PCOM -02/03/2020), polyneuropathy and history of right carotid dissection, COPD    PRECAUTIONS:  Fall and Other: atrial fibrillation, mitral regurgitation, OSA, cerebral aneurysm x2 (stable- 4 x 553mright MCA bifurcation and 2 mm left PCOM -02/03/2020), polyneuropathy and history of right carotid dissection   SUBJECTIVE: Patient reports that he rolled and injured his L ankle approx two weeks ago, no  fracture and is feeling better.     PAIN:  Are you having pain? No  OBJECTIVE:   [facility] PT Assessment - 07/13/21 0001       Observation/Other Assessments   Focus on Therapeutic Outcomes (FOTO)  DFS: 62, DPS: 63.6      Functional Gait  Assessment   Gait assessed  Yes    Gait Level Surface Walks 20 ft in less than 7 sec but greater than 5.5 sec, uses assistive device, slower speed, mild gait deviations, or deviates 6-10 in outside of the 12 in walkway width.    Change in Gait Speed Able to smoothly change walking speed without loss of balance or gait deviation. Deviate no more than 6 in outside of the 12 in walkway width.    Gait with Horizontal Head Turns Performs head turns smoothly with no change in gait. Deviates no more than 6 in outside 12 in walkway width    Gait with Vertical Head Turns Performs head turns with no change in gait. Deviates no more than 6 in outside 12 in walkway  width.    Gait and Pivot Turn Pivot turns safely within 3 sec and stops quickly with no loss of balance.    Step Over Obstacle Is able to step over 2 stacked shoe boxes taped together (9 in total height) without changing gait speed. No evidence of imbalance.    Gait with Narrow Base of Support Ambulates 4-7 steps.    Gait with Eyes Closed Walks 20 ft, uses assistive device, slower speed, mild gait deviations, deviates 6-10 in outside 12 in walkway width. Ambulates 20 ft in less than 9 sec but greater than 7 sec.    Ambulating Backwards Walks 20 ft, uses assistive device, slower speed, mild gait deviations, deviates 6-10 in outside 12 in walkway width.    Steps Alternating feet, must use rail.    Total Score 24    FGA comment: 22/24 = Low Fall Risk             Vestibular Assessment - 07/13/21 0001       Visual Acuity   Static 10    Dynamic 9             VESTIBULAR TREATMENT:  Reviewed HEP:   Calf Stretch    Place one leg forward, bent, other leg behind and straight. Lean forward keeping  back heel flat. Hold 30 seconds while counting out loud. Repeat with other leg forward. Repeat 3 times. Do 1 sessions per day  Band Walk: Side Stepping    Tie red band around legs, just above knees. Step 10-15 feet to one side, then step back to start. Repeat 4 times per session. Use light support from countertop for support.  Gaze Stabilization - Tip Card   1.Target must remain in focus, not blurry, and appear stationary while head is in motion. 2.Perform exercises with small head movements (45 to either side of midline). 3.Increase speed of head motion so long as target is in focus. 4.If you wear eyeglasses, be sure you can see target through lens (therapist will give specific instructions for bifocal / progressive lenses). 5.These exercises may provoke dizziness or nausea. Work through these symptoms. If too dizzy, slow head movement slightly. Rest between each exercise. 6.Exercises demand concentration; avoid distractions. 7.For safety, perform standing exercises close to a counter, wall, corner, or next to someone.  Copyright  VHI. All rights reserved.    Gaze Stabilization - Standing Feet Apart   Feet shoulder width apart, keeping eyes on target on wall 3 feet away, tilt head down slightly and move head side to side for 60 seconds; shorten range of motion to avoid double vision. Repeat while moving head up and down for 60 seconds at slightly faster speed (think tick tock) Do 2-3 sessions per day.       Sit to Stand: Head Upright    With head upright, stand up pushing lightly with hands.  When sitting back down, keep hands on thighs and sit down very SLOWLY letting hips, knees and ankles bend. Repeat __10__ times per session. Do __2__ sessions per day.      Feet Together, Head Motion - Eyes Closed      With eyes closed and feet together, move head slowly, up and down 10 times.  Rest and then repeat moving head side to side 10 times, slowly.   Repeat 2 times per  session. Do 2 sessions per day.       Weight Shift: Diagonal    Stand holding a chair with one hand.  One foot forward, one foot back.  Slowly shift weight forward over right leg by pushing through left toes, lifting left heel. Shift backward over opposite leg, lifting toes of front foot.  Repeat 10 times with right foot forward, switch and repeat with left foot forward. Do __2__ sessions per day.   PATIENT EDUCATION: Education details: Progress toward LTGs; Continue with HEP.   Person educated: Patient Education method: Explanation, Demonstration, Handout Education comprehension: verbalized understanding, return demonstration    ASSESSMENT:   CLINICAL IMPRESSION:  Patient has had extended absence from PT due to ankle injury, but returns today. Today's skilled PT session focused on assessment of patient's progress toward all LTGs. Patient able to meet/partially meet all LTG today indicating improved balance, improved VOR function and improved activity tolerance with reduced dizziness. Patient stating readiness to d/c from PT services today and work on exercises at home to self manage. PT agreeable. Patient is has made significant progress with PT services.     REHAB POTENTIAL: Good   CLINICAL DECISION MAKING: Stable/uncomplicated   EVALUATION COMPLEXITY: Low     GOALS: Goals reviewed with patient? Yes   SHORT TERM GOALS:    STG Name Target Date Goal status  1 Patient will undergo FGA and LTG to be set as applicable Baseline:  3/55/73 MET    LONG TERM GOALS:   LTG Name Target Date Goal status  1 Patient will be independent with vestibular/balance HEP Baseline: 07/05/21 INITIAL  2 Patient will improve DVA to </= 2 line difference to demonstrate improved VOR Baseline: 4 line difference; 1 Line Difference 07/05/21 MET  3 Pt will demonstrate 4 point improvement in FGA to indicate decreased falls risk Baseline: 22/30; 24/30 07/05/21 PARTIALLY MET  4 Patient will improve DFS  >/= 59 , and DPS >/= 62 Baseline: DFS: 62, DPS: 63.6 07/05/21 MET    PLAN: PT FREQUENCY: 1x/week   PT DURATION: 1 weeks   PLANNED INTERVENTIONS: Therapeutic exercises, Therapeutic activity, Neuro Muscular re-education, Balance training, Gait training, Patient/Family education, Joint mobilization, Stair training, Vestibular training, Canalith repositioning, Orthotic/Fit training and DME instructions   PLAN FOR NEXT SESSION:    Jones Bales, PT, DPT 07/13/21    11:32 AM  [facility] [address] [place], [place], [address] Phone: [phone]   Fax:  [phone]  Patient name: Maurice Wood MRN: [id number] DOB: 01-Jan-1933

## 2021-07-17 ENCOUNTER — Encounter: Payer: Self-pay | Admitting: Pulmonary Disease

## 2021-07-17 ENCOUNTER — Other Ambulatory Visit: Payer: Self-pay

## 2021-07-17 ENCOUNTER — Ambulatory Visit: Payer: PPO | Admitting: Pulmonary Disease

## 2021-07-17 VITALS — BP 124/72 | HR 70 | Temp 97.8°F | Ht 70.0 in | Wt 180.4 lb

## 2021-07-17 DIAGNOSIS — Z9989 Dependence on other enabling machines and devices: Secondary | ICD-10-CM

## 2021-07-17 DIAGNOSIS — J432 Centrilobular emphysema: Secondary | ICD-10-CM

## 2021-07-17 DIAGNOSIS — R911 Solitary pulmonary nodule: Secondary | ICD-10-CM | POA: Diagnosis not present

## 2021-07-17 DIAGNOSIS — G4733 Obstructive sleep apnea (adult) (pediatric): Secondary | ICD-10-CM

## 2021-07-17 DIAGNOSIS — J849 Interstitial pulmonary disease, unspecified: Secondary | ICD-10-CM | POA: Diagnosis not present

## 2021-07-17 NOTE — Progress Notes (Signed)
[facility]  Chief Complaint  Patient presents with   Follow-up    Still SOB at times    Constitutional:  BP 124/72 (BP Location: Left Arm, Patient Position: Sitting)   Pulse 70   Temp 97.8 F (36.6 C) (Oral)   Ht '5\' 10"'$  (1.778 m)   Wt 180 lb 6.4 oz (81.8 kg)   SpO2 97%   BMI 25.88 kg/m   Past Medical History:  Nephrolithiasis, IBS, HLD, Colon polyps, Cerebral aneurysm, BPH, A fib, Cystoid macular degeneration, Idiopathic peripheral neuropathy  Past Surgical History:  He  has a past surgical history that includes Cholecystectomy; inguinal and umbilical herniorrhaphy; Tonsillectomy and adenoidectomy (as a child); Cardiac catheterization (1999); Hernia repair; left heart catheterization with coronary angiogram (N/A, 06/01/2014); Lithotripsy; Interstim Implant placement (04/2017); TEE without cardioversion (N/A, 07/05/2020); and RIGHT/LEFT HEART CATH AND CORONARY ANGIOGRAPHY (N/A, 07/08/2020).  Brief Summary:  Maurice Wood is a 87 y.o. male former smoker with obstructive sleep apnea, COPD, lung nodule and mild ILD.      Subjective:   Breathing has been okay.  Not having cough, wheeze, or sputum.  Gets around with activity at steady pace.  Advair helps.  Has to use albuterol once or twice per week.  Using CPAP nightly.  No issues with pressure or mask fit.  Has been losing weight.  Was 185 lbs at visit in January 2022.  He is excited about have a great grandson and great granddaughter.  CT chest showed benign nodule and stable ILD.  Physical Exam:   Appearance - well kempt   ENMT - no sinus tenderness, no oral exudate, no LAN, Mallampati 2 airway, no stridor  Respiratory - decreased breath sounds bilaterally, no wheezing or rales  CV - s1s2 regular rate and rhythm, 2/6 SM  Ext - no clubbing, no edema  Skin - no rashes  Psych - normal mood and affect     Pulmonary testing:  PFT 08/08/20 >> FEV1 1.67 (61%), FEV1% 55, TLC 4.74  (67%), DLCO 50% PFT 12/06/20 >> FEV1 1.87 (70%), FEV1% 65, TLC 5.55 (78%), DLCO 47%  Chest Imaging:  CT chest 06/10/20 >> atherosclerosis, mod HH, 4 mm RML nodule, calcified granulomas, bronchial thickening, mild centrilobular and paraseptal emphysema, areas of GGO with mild septal thickening and thickening of peribronchovascular interstitium in lower lobes b/l HRCT chest 06/20/21 >> RML nodule 4 mm stable benign, elevated Lt diaphragm, no change in irregular peripheral interstitial opacity and septal thickening at the lung bases, including involvement of the non dependent portions of the right middle lobe and lingula  Sleep Tests:  PSG 11/09/17 >> AHI 40.8, SpO2 79% CPAP titration 03/25/18 >> CPAP 12 cm H2O >> AHI 0. CPAP 06/17/21 to 07/16/21 >> used on 29 of 30 nights with average 8 hrs 48 min.  Average AHI 0.6 with CPAP 12 cm H2O  Cardiac Tests:  Echo 06/09/20 >> EF 60 to 65%, mod LA/RA dilation, mod/severe MR, ascending aorta 39 mm  Social History:  He  reports that he quit smoking about 49 years ago. His smoking use included cigarettes. He has a 20.00 pack-year smoking history. He has never used smokeless tobacco. He reports current alcohol use of about 1.0 standard drink of alcohol per week. He reports that he does not use drugs.  Family History:  His family history includes Alcohol abuse in his father; Aneurysm in his father; Cancer in his father and maternal grandmother; Diabetes in his maternal uncle, paternal  uncle, and son; Heart disease in his paternal grandfather; Sudden death in his mother.     Assessment/Plan:   COPD with emphysema. - continue advair  - prn albuterol  Mild ILD. - stable on CT chest from July 2022 - monitor clinically  Lung nodule. - benign; no additional follow up needed   Obstructive sleep apnea. - he is compliant with CPAP and reports benefit from therapy - uses [employer] for his DME - continue CPAP 12 cm H2O  Weight loss. - has lost about 5 lbs in  past 6 months; if weight continues to drop then he would need further assessment with PCP   Permanent A fib, mild/mod MR. - followed by Dr. Pierre Bali with [facility]   Chorioretinal inflammation of Rt eye. - off prednisone, imuran - previously treated with MTX - followed by Dr. Dwana Melena with ophthalmology at [facility]   Idiopathic peripheral neuropathy. - followed by Dr. Tomi Likens with [facility]  Time Spent Involved in Patient Care on Day of Examination:  32 minutes  Follow up:   Patient Instructions  Follow up in 6 months  Medication List:   Allergies as of 07/17/2021       Reactions   Epinephrine Palpitations   Increased Heart Rate   Nsaids Other (See Comments)   NOT WHILE TAKING ELIQUIS   Clarithromycin Other (See Comments)   headache   Famotidine    Other reaction(s): NAUSEA   Iodinated Diagnostic Agents Other (See Comments)   Pt states "turning bright red"   Nimodipine    Flushing   Other Other (See Comments)   Pt states "turning bright red"        Medication List        Accurate as of July 17, 2021 10:45 AM. If you have any questions, ask your nurse or doctor.          STOP taking these medications    famotidine 20 MG tablet Commonly known as: PEPCID Stopped by: Chesley Mires, MD   predniSONE 20 MG tablet Commonly known as: DELTASONE Stopped by: Chesley Mires, MD       TAKE these medications    acetaminophen 500 MG tablet Commonly known as: TYLENOL Take 500-1,000 mg by mouth every 6 (six) hours as needed for moderate pain.   albuterol 108 (90 Base) MCG/ACT inhaler Commonly known as: VENTOLIN HFA Inhale 2 puffs into the lungs every 6 (six) hours as needed for wheezing or shortness of breath.   amoxicillin 500 MG capsule Commonly known as: AMOXIL Take 500 mg by mouth 3 (three) times daily.   cetirizine 10 MG tablet Commonly known as: ZYRTEC Take 10 mg by mouth daily.   chlorpheniramine 4 MG tablet Commonly known as:  CHLOR-TRIMETON Take 4 mg by mouth daily as needed for allergies.   cholecalciferol 25 MCG (1000 UNIT) tablet Commonly known as: VITAMIN D3 1 tablet   colestipol 1 g tablet Commonly known as: COLESTID Take 2 g by mouth daily.   Eliquis 5 MG Tabs tablet Generic drug: apixaban TAKE 1 TABLET(5 MG) BY MOUTH TWICE DAILY   finasteride 5 MG tablet Commonly known as: PROSCAR Take 5 mg by mouth daily.   fluticasone 50 MCG/ACT nasal spray Commonly known as: FLONASE Place 1 spray into both nostrils daily.   Fluticasone-Salmeterol 250-50 MCG/DOSE Aepb Commonly known as: ADVAIR INHALE 1 PUFF INTO THE LUNGS IN THE MORNING AND AT BEDTIME   furosemide 40 MG tablet Commonly known as: LASIX 1 tablet  omeprazole 20 MG capsule Commonly known as: PRILOSEC Take 20 mg by mouth daily.   ondansetron 8 MG tablet Commonly known as: ZOFRAN 8 mg every 8 (eight) hours as needed.   vitamin B-12 1000 MCG tablet Commonly known as: CYANOCOBALAMIN Take 1,000 mcg by mouth every other day.        Signature:  Chesley Mires, MD [place] Pager - [phone] 07/17/2021, 10:45 AM

## 2021-07-17 NOTE — Patient Instructions (Signed)
Follow up in 6 months 

## 2021-07-25 IMAGING — DX DG CHEST 2V
2 series · 2 of 2 positions shown · non-contrast
Comparison: 09/03/2017

CLINICAL DATA: Shortness of breath with exertion

EXAM:
CHEST - 2 VIEW

[chest pa]
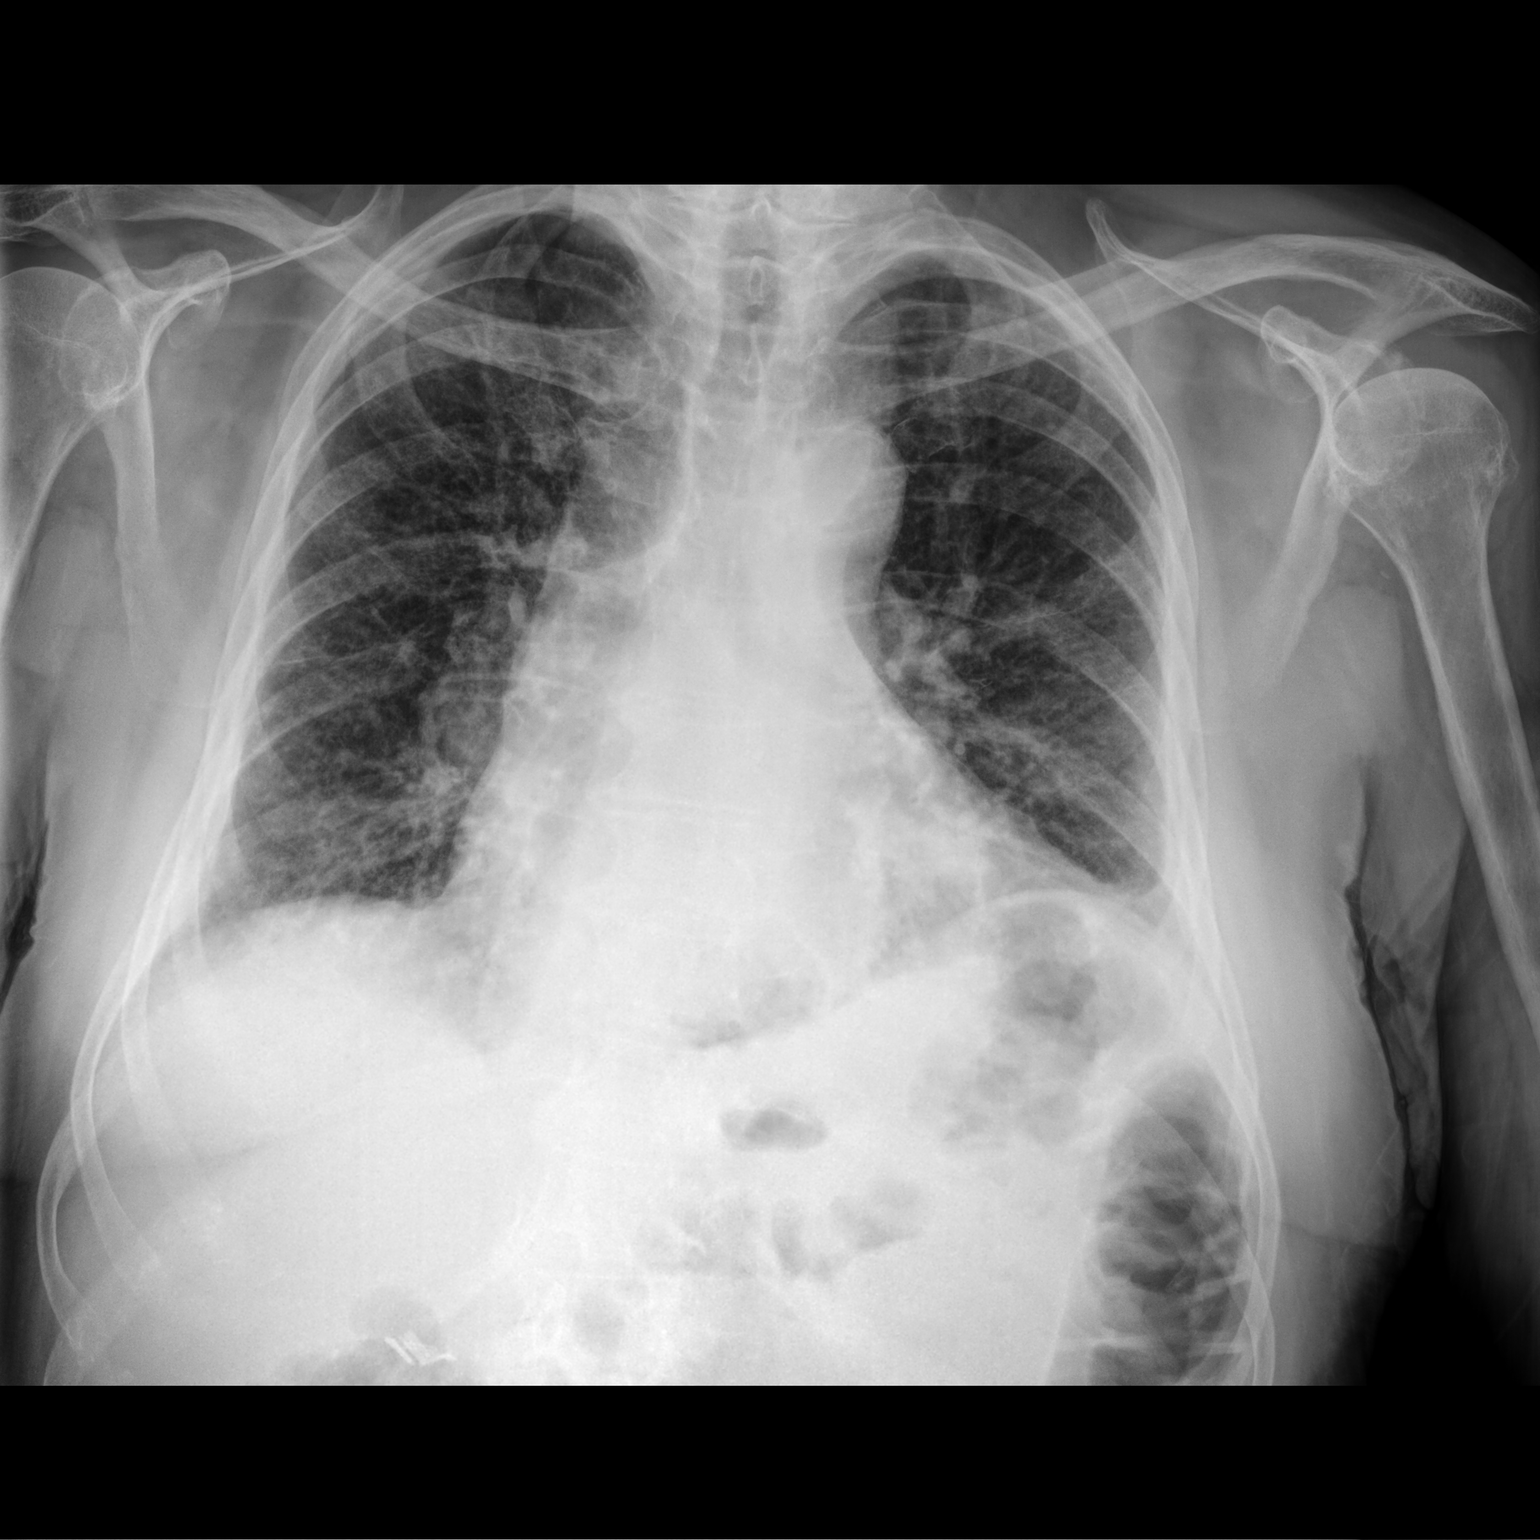

[chest lat]
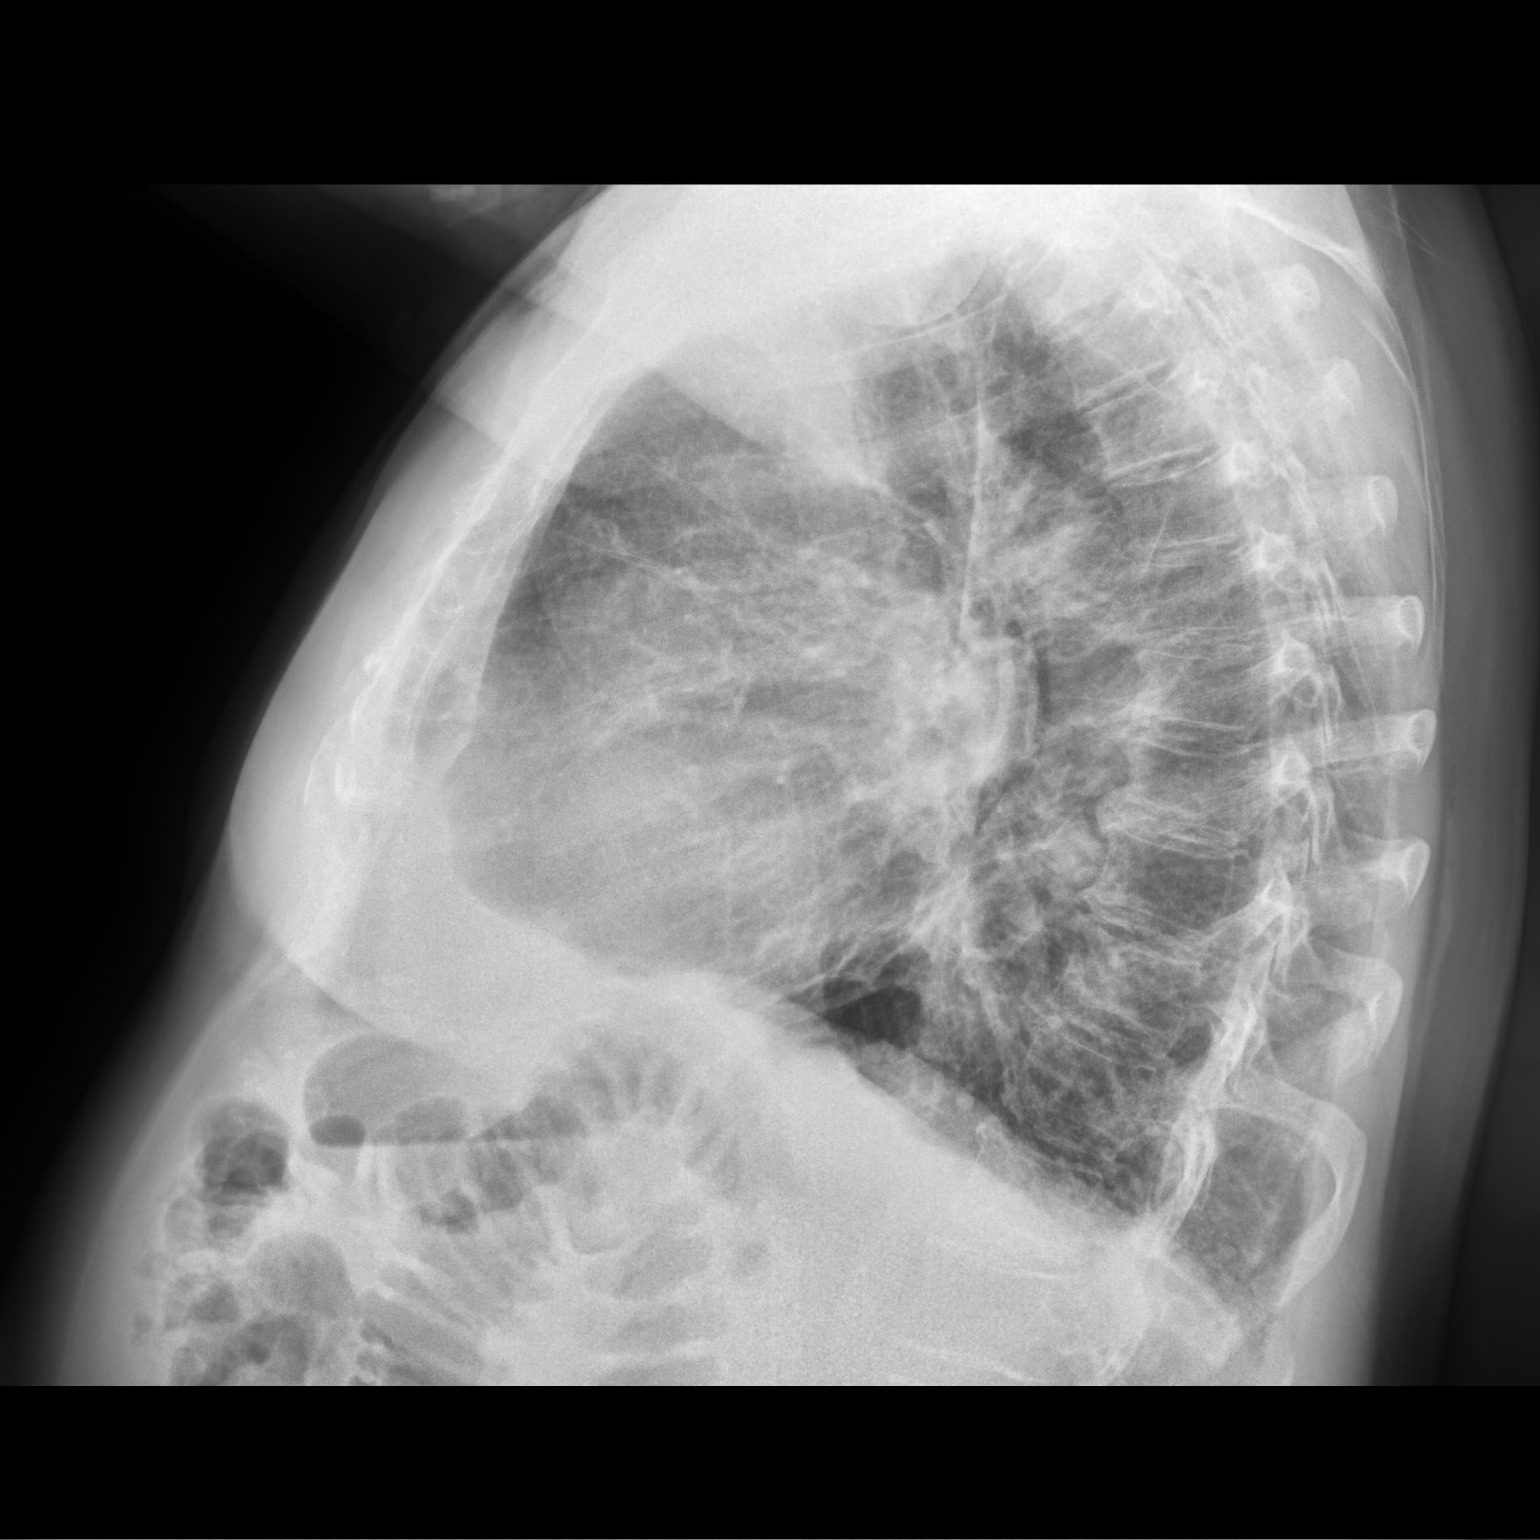

[2 of 2 positions shown; findings below may reference images not displayed]

FINDINGS: Mild cardiomegaly. Right lung clear. Persistent density at the left
lung base, presumably scarring. No effusions or acute bony
abnormality.
IMPRESSION: Chronic left basilar density likely reflects scarring.

Mild cardiomegaly.

No active cardiopulmonary disease.

## 2021-07-28 DIAGNOSIS — S51012A Laceration without foreign body of left elbow, initial encounter: Secondary | ICD-10-CM | POA: Diagnosis not present

## 2021-07-31 DIAGNOSIS — S51012D Laceration without foreign body of left elbow, subsequent encounter: Secondary | ICD-10-CM | POA: Diagnosis not present

## 2021-08-07 ENCOUNTER — Other Ambulatory Visit: Payer: Self-pay

## 2021-08-07 ENCOUNTER — Encounter: Payer: Self-pay | Admitting: Podiatry

## 2021-08-07 ENCOUNTER — Ambulatory Visit: Payer: PPO | Admitting: Podiatry

## 2021-08-07 DIAGNOSIS — M79674 Pain in right toe(s): Secondary | ICD-10-CM

## 2021-08-07 DIAGNOSIS — D689 Coagulation defect, unspecified: Secondary | ICD-10-CM

## 2021-08-07 DIAGNOSIS — S51012D Laceration without foreign body of left elbow, subsequent encounter: Secondary | ICD-10-CM | POA: Diagnosis not present

## 2021-08-07 DIAGNOSIS — S51011A Laceration without foreign body of right elbow, initial encounter: Secondary | ICD-10-CM | POA: Diagnosis not present

## 2021-08-07 DIAGNOSIS — B351 Tinea unguium: Secondary | ICD-10-CM

## 2021-08-07 DIAGNOSIS — M79675 Pain in left toe(s): Secondary | ICD-10-CM

## 2021-08-07 DIAGNOSIS — E1142 Type 2 diabetes mellitus with diabetic polyneuropathy: Secondary | ICD-10-CM

## 2021-08-07 DIAGNOSIS — G629 Polyneuropathy, unspecified: Secondary | ICD-10-CM

## 2021-08-07 NOTE — Progress Notes (Signed)
This patient returns to my office for at risk foot care.  This patient requires this care by a professional since this patient will be at risk due to having diabetic neuropathy, and coagulation disorder.  Patient is taking eliquiss.  This patient is unable to cut nails himself since the patient cannot reach his nails.These nails are painful walking and wearing shoes.  This patient presents for at risk foot care today.  General Appearance  Alert, conversant and in no acute stress.  Vascular  Dorsalis pedis and posterior tibial  pulses are  not palpable  bilaterally.  Capillary return is within normal limits  bilaterally. Temperature is within normal limits  bilaterally.  Neurologic  Senn-Weinstein monofilament wire test absent   bilaterally. Muscle power within normal limits bilaterally.  Nails Thick disfigured discolored nails with subungual debris  hallux nails  bilaterally. No evidence of bacterial infection or drainage bilaterally.  Orthopedic  No limitations of motion  feet .  No crepitus or effusions noted.  No bony pathology or digital deformities noted.  Skin  normotropic skin with no porokeratosis noted bilaterally.  No signs of infections or ulcers noted.     Onychomycosis  Pain in right toes  Pain in left toes  Consent was obtained for treatment procedures.   Mechanical debridement of nails 1-5  bilaterally performed with a nail nipper.  Filed with dremel without incident.    Return office visit    3 months                  Told patient to return for periodic foot care and evaluation due to potential at risk complications.   Cheney Gosch DPM  

## 2021-08-18 DIAGNOSIS — E538 Deficiency of other specified B group vitamins: Secondary | ICD-10-CM | POA: Diagnosis not present

## 2021-08-18 DIAGNOSIS — I700 Atherosclerosis of aorta: Secondary | ICD-10-CM | POA: Diagnosis not present

## 2021-08-18 DIAGNOSIS — Z0000 Encounter for general adult medical examination without abnormal findings: Secondary | ICD-10-CM | POA: Diagnosis not present

## 2021-08-18 DIAGNOSIS — R7303 Prediabetes: Secondary | ICD-10-CM | POA: Diagnosis not present

## 2021-08-18 DIAGNOSIS — K219 Gastro-esophageal reflux disease without esophagitis: Secondary | ICD-10-CM | POA: Diagnosis not present

## 2021-08-18 DIAGNOSIS — D6869 Other thrombophilia: Secondary | ICD-10-CM | POA: Diagnosis not present

## 2021-08-18 DIAGNOSIS — I482 Chronic atrial fibrillation, unspecified: Secondary | ICD-10-CM | POA: Diagnosis not present

## 2021-08-18 DIAGNOSIS — Z1389 Encounter for screening for other disorder: Secondary | ICD-10-CM | POA: Diagnosis not present

## 2021-08-18 DIAGNOSIS — R609 Edema, unspecified: Secondary | ICD-10-CM | POA: Diagnosis not present

## 2021-08-18 DIAGNOSIS — E559 Vitamin D deficiency, unspecified: Secondary | ICD-10-CM | POA: Diagnosis not present

## 2021-08-18 DIAGNOSIS — J302 Other seasonal allergic rhinitis: Secondary | ICD-10-CM | POA: Diagnosis not present

## 2021-08-18 DIAGNOSIS — T148XXA Other injury of unspecified body region, initial encounter: Secondary | ICD-10-CM | POA: Diagnosis not present

## 2021-09-25 DIAGNOSIS — G4733 Obstructive sleep apnea (adult) (pediatric): Secondary | ICD-10-CM | POA: Diagnosis not present

## 2021-09-27 ENCOUNTER — Other Ambulatory Visit: Payer: Self-pay

## 2021-09-27 ENCOUNTER — Ambulatory Visit (HOSPITAL_COMMUNITY)
Admission: RE | Admit: 2021-09-27 | Discharge: 2021-09-27 | Disposition: A | Discharge status: Home / Self Care | Payer: PPO | Source: Ambulatory Visit | Attending: Internal Medicine | Admitting: Internal Medicine

## 2021-09-27 ENCOUNTER — Ambulatory Visit (HOSPITAL_BASED_OUTPATIENT_CLINIC_OR_DEPARTMENT_OTHER)
Admission: RE | Admit: 2021-09-27 | Discharge: 2021-09-27 | Disposition: A | Discharge status: Home / Self Care | Payer: PPO | Source: Ambulatory Visit | Attending: Internal Medicine | Admitting: Internal Medicine

## 2021-09-27 VITALS — BP 124/76 | HR 65 | Wt 182.0 lb

## 2021-09-27 DIAGNOSIS — I4821 Permanent atrial fibrillation: Secondary | ICD-10-CM | POA: Diagnosis not present

## 2021-09-27 DIAGNOSIS — I080 Rheumatic disorders of both mitral and aortic valves: Secondary | ICD-10-CM | POA: Diagnosis not present

## 2021-09-27 DIAGNOSIS — I110 Hypertensive heart disease with heart failure: Secondary | ICD-10-CM | POA: Insufficient documentation

## 2021-09-27 DIAGNOSIS — I5032 Chronic diastolic (congestive) heart failure: Secondary | ICD-10-CM

## 2021-09-27 DIAGNOSIS — J449 Chronic obstructive pulmonary disease, unspecified: Secondary | ICD-10-CM | POA: Diagnosis not present

## 2021-09-27 DIAGNOSIS — I351 Nonrheumatic aortic (valve) insufficiency: Secondary | ICD-10-CM

## 2021-09-27 DIAGNOSIS — I340 Nonrheumatic mitral (valve) insufficiency: Secondary | ICD-10-CM | POA: Diagnosis not present

## 2021-09-27 DIAGNOSIS — I671 Cerebral aneurysm, nonruptured: Secondary | ICD-10-CM | POA: Insufficient documentation

## 2021-09-27 DIAGNOSIS — E785 Hyperlipidemia, unspecified: Secondary | ICD-10-CM | POA: Diagnosis not present

## 2021-09-27 DIAGNOSIS — G4730 Sleep apnea, unspecified: Secondary | ICD-10-CM | POA: Insufficient documentation

## 2021-09-27 LAB — ECHOCARDIOGRAM COMPLETE
P 1/2 time: 342 msec
S' Lateral: 2.8 cm

## 2021-09-27 NOTE — Progress Notes (Signed)
CARDIOLOGY CLINIC NOTE  Patient ID: Maurice Wood, male   DOB: 1933/01/05, 87 y.o.   MRN: [id number]  HPI:  Maurice Wood is an 87 y.o. 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  Had episode of CP in January 2011 and underwent cath which showed normal coronaries with EF45% (? artificially low due to AF). Echo  2/11: 60-65%  Echo 7/16  EF 60% Mod MR.  ECHO 2017 EF 60-65%.   Echo 11/18 EF 60-65% severe MAC. Moderate MR  He was seen in the ER in 6/21 for progressive SOB and cough. No CP. hstrop normal. (7,7). BNP 287 (in setting of chronic AF)  ECG AF 105. CXR showed mass-like density in the right infrahilar region   CT chest 06/09/20 concerning for moderate COPD and  ILD. Referred to Pulmonary   7/21 TEE to evaluate MR  EF 60-65% Normal RV  MV thickened with bileaflet prolapse 2-3+ MR  Echo today 09/27/21 EF 60-65% Norma RV MV thick moderate MR. Mild to moderate AI. Personally reviewed  Here for f/u. Says he is doing pretty well. Here with his wife. Doing pretty well. Still able to work out in yard some. Was raking acorns yesterday and hauled the, to the curb. Mild DOE. (No change). No edema, orthopnea or PND. No recent falls. Easy bruising with Eliquis. No obvious bleeding or melena.   Studies:  Echo 9/20 EF 60% mild-mod MR mild AI Severe LAE Personally reviewed  R/L cath 7/21   Ao = 129/73 (101) LV =  133/10 RA = 7 RV = 54/6 PA = 54/14 (30) PCW = 15 (V = 25) Fick cardiac output/index = 5.1/2.5 PVR = 3.0 Ao sat = 97% PA sat = 70%, 71%   Assessment: 1. Normal coronaries with left dominant system. RCA not injected but shown to be very small non-dominant vessel on previous study. LAD with small aneurysmal segment proximally but otherwise normal 2. EF 65% 3. Mild PAH with normal output. PCWP 15. V wave 22-25   PFTs 12/21: FEV1 1.87 (70%), FVC 2.90 (76%) DLCO 60%   Review of systems  complete and found to be negative unless listed in HPI.   Past Medical History:  Diagnosis Date   Allergy    rhinitis   Atrial fibrillation [facility]) Jan 2007   echo 1-07 normal ejection fraction, no significant valvular disease. adenosine cardiolite 1-07  no evidence of ischemia. A 48 hour Holter monitor in 7-07 showed good rate controlw/ chronic atrial fibrillation. Cath 1/11 normal cors. EF 45%. Echo 2-11 60-65%   Benign prostatic hypertrophy    Cerebral aneurysm    followed by Dr Estanislado Pandy   CHF (congestive heart failure) (HCC)    COPD (chronic obstructive pulmonary disease) (HCC)    Fatigue    History of chest pain    a. s/p LHC 05/2014 with normal cors   Hx of colonic polyps    diverticulosis   Hyperlipidemia    IBS (irritable bowel syndrome)    Incontinence    Per pt 12/14/11   Kidney stones    Obesity    Obstructive sleep apnea    noncompliant with CPAP   Retinal vein occlusion     Current Outpatient Medications  Medication Sig Dispense Refill   acetaminophen (TYLENOL) 500 MG tablet Take 500-1,000 mg by mouth every 6 (six) hours as needed for moderate pain.     albuterol (VENTOLIN HFA)  108 (90 Base) MCG/ACT inhaler Inhale 2 puffs into the lungs every 6 (six) hours as needed for wheezing or shortness of breath. 8 g 6   chlorpheniramine (CHLOR-TRIMETON) 4 MG tablet Take 4 mg by mouth daily as needed for allergies.      cholecalciferol (VITAMIN D3) 25 MCG (1000 UNIT) tablet 1 tablet     colestipol (COLESTID) 1 g tablet Take 2 g by mouth daily.     ELIQUIS 5 MG TABS tablet TAKE 1 TABLET(5 MG) BY MOUTH TWICE DAILY 180 tablet 3   finasteride (PROSCAR) 5 MG tablet Take 5 mg by mouth daily.     fluticasone (FLONASE) 50 MCG/ACT nasal spray Place 1 spray into both nostrils daily.     Fluticasone-Salmeterol (ADVAIR) 250-50 MCG/DOSE AEPB INHALE 1 PUFF INTO THE LUNGS IN THE MORNING AND AT BEDTIME 60 each 5   furosemide (LASIX) 40 MG tablet Take 40 mg by mouth daily as needed.      omeprazole (PRILOSEC) 20 MG capsule Take 20 mg by mouth daily.      ondansetron (ZOFRAN) 8 MG tablet 8 mg every 8 (eight) hours as needed.     potassium chloride SA (KLOR-CON) 20 MEQ tablet Take 20 mEq by mouth daily as needed.     vitamin B-12 (CYANOCOBALAMIN) 1000 MCG tablet Take 1,000 mcg by mouth every other day.      No current facility-administered medications for this encounter.     PHYSICAL EXAM: Vitals:   09/27/21 1415  BP: 124/76  Pulse: 65  SpO2: 97%   Wt Readings from Last 3 Encounters:  09/27/21 82.6 kg (182 lb)  07/17/21 81.8 kg (180 lb 6.4 oz)  04/28/21 82.1 kg (181 lb)   Physical exam: General:  Elderly Well appearing. No resp difficulty HEENT: normal Neck: supple. no JVD. Carotids 2+ bilat; no bruits. No lymphadenopathy or thryomegaly appreciated. Cor: PMI nondisplaced. Irregular rate & rhythm. 2/6 MR Lungs: clear Abdomen: soft, nontender, nondistended. No hepatosplenomegaly. No bruits or masses. Good bowel sounds. Extremities: no cyanosis, clubbing, rash, edema Neuro: alert & orientedx3, cranial nerves grossly intact. moves all 4 extremities w/o difficulty. Affect pleasant   Lab Results  Component Value Date   CHOL 166 02/24/2014   HDL 53.80 02/24/2014   LDLCALC 84 02/24/2014   LDLDIRECT 97.4 11/09/2011   TRIG 143.0 02/24/2014   CHOLHDL 3 02/24/2014    ASSESSMENT & PLAN:  1. Chronic diastolic HF - Echo today 39/76/73 EF 60-65% l RV MV thick moderate MR. Mild to moderate AI. Personally reviewed - stable NYHA II-early III - volume status looks good. Has not required lasix  2. A-fib -  Permanent. - Rate controlled. Continue eliquis 5 mg twice a day. No indication for dose reduction currently - No significant bleeding  3. Moderate MR - Moderate MR on echo 10/2017. EF 60-65% Severe MAC ?prolapse, but leaflets not seen well.  - TEE 7/21 with 2-3+ MR - Echo today 09/27/21 EF 60-65% Norma RV MV thick moderate MR. Mild to moderate AI. Personally  reviewed - If symptoms worsen could consider Clip - repeat echo 1 year  4. OSA -complaint with CPAP  5. Abnormal chest CT - Felt to have COPD +/- ILD. Has seen Dr. Halford Chessman.   6. Aortic root dilation - Echo today AoRoot 4.5cm.(likely overestimated) Follow yearly   Glori Bickers MD 2:22 PM

## 2021-09-27 NOTE — Progress Notes (Signed)
Medication Samples have been provided to the patient.  Drug name: Eliquis       Strength: 5mg         Qty: 8  LOT: [id number]  Exp.Date: 08/2023  Dosing instructions: Take 1 tab Twice daily   The patient has been instructed regarding the correct time, dose, and frequency of taking this medication, including desired effects and most common side effects.   Malcome Ambrocio 2:52 PM 09/27/2021

## 2021-09-27 NOTE — Patient Instructions (Signed)
Your physician recommends that you schedule a follow-up appointment in: 1 year with an echocardiogram  If you have any questions or concerns before your next appointment please send [place] a message through [place] or call our office at [phone].    TO LEAVE A MESSAGE FOR THE NURSE SELECT OPTION 2, PLEASE LEAVE A MESSAGE INCLUDING: YOUR NAME DATE OF BIRTH CALL BACK NUMBER REASON FOR CALL**this is important as we prioritize the call backs  YOU WILL RECEIVE A CALL BACK THE SAME DAY AS LONG AS YOU CALL BEFORE 4:00 PM  At the [facility], you and your health needs are our priority. As part of our continuing mission to provide you with exceptional heart care, we have created designated Provider Care Teams. These Care Teams include your primary Cardiologist (physician) and Advanced Practice Providers (APPs- Physician Assistants and Nurse Practitioners) who all work together to provide you with the care you need, when you need it.   You may see any of the following providers on your designated Care Team at your next follow up: Dr Glori Bickers Dr Loralie Champagne Dr Patrice Paradise, NP Lyda Jester, [place] Ginnie Smart Audry Riles, PharmD   Please be sure to bring in all your medications bottles to every appointment.

## 2021-09-27 NOTE — Addendum Note (Signed)
Encounter addended by: Scarlette Calico, RN on: 09/27/2021 2:55 PM  Actions taken: Order list changed, Diagnosis association updated, Clinical Note Signed

## 2021-10-08 DIAGNOSIS — B349 Viral infection, unspecified: Secondary | ICD-10-CM | POA: Diagnosis not present

## 2021-10-08 DIAGNOSIS — J029 Acute pharyngitis, unspecified: Secondary | ICD-10-CM | POA: Diagnosis not present

## 2021-10-08 DIAGNOSIS — Z03818 Encounter for observation for suspected exposure to other biological agents ruled out: Secondary | ICD-10-CM | POA: Diagnosis not present

## 2021-10-08 DIAGNOSIS — R059 Cough, unspecified: Secondary | ICD-10-CM | POA: Diagnosis not present

## 2021-10-10 DIAGNOSIS — H34832 Tributary (branch) retinal vein occlusion, left eye, with macular edema: Secondary | ICD-10-CM | POA: Diagnosis not present

## 2021-10-10 DIAGNOSIS — Z79899 Other long term (current) drug therapy: Secondary | ICD-10-CM | POA: Diagnosis not present

## 2021-10-10 DIAGNOSIS — H35372 Puckering of macula, left eye: Secondary | ICD-10-CM | POA: Diagnosis not present

## 2021-10-10 DIAGNOSIS — H30031 Focal chorioretinal inflammation, peripheral, right eye: Secondary | ICD-10-CM | POA: Diagnosis not present

## 2021-10-10 DIAGNOSIS — H35352 Cystoid macular degeneration, left eye: Secondary | ICD-10-CM | POA: Diagnosis not present

## 2021-10-10 DIAGNOSIS — Z961 Presence of intraocular lens: Secondary | ICD-10-CM | POA: Diagnosis not present

## 2021-10-10 DIAGNOSIS — H43813 Vitreous degeneration, bilateral: Secondary | ICD-10-CM | POA: Diagnosis not present

## 2021-11-13 ENCOUNTER — Ambulatory Visit: Payer: PPO | Admitting: Podiatry

## 2021-11-13 ENCOUNTER — Other Ambulatory Visit: Payer: Self-pay

## 2021-11-13 ENCOUNTER — Encounter: Payer: Self-pay | Admitting: Podiatry

## 2021-11-13 DIAGNOSIS — B351 Tinea unguium: Secondary | ICD-10-CM | POA: Diagnosis not present

## 2021-11-13 DIAGNOSIS — E1142 Type 2 diabetes mellitus with diabetic polyneuropathy: Secondary | ICD-10-CM

## 2021-11-13 DIAGNOSIS — M79674 Pain in right toe(s): Secondary | ICD-10-CM

## 2021-11-13 DIAGNOSIS — M79675 Pain in left toe(s): Secondary | ICD-10-CM | POA: Diagnosis not present

## 2021-11-13 DIAGNOSIS — G629 Polyneuropathy, unspecified: Secondary | ICD-10-CM

## 2021-11-13 DIAGNOSIS — D689 Coagulation defect, unspecified: Secondary | ICD-10-CM

## 2021-11-13 NOTE — Progress Notes (Signed)
This patient returns to my office for at risk foot care.  This patient requires this care by a professional since this patient will be at risk due to having diabetic neuropathy, and coagulation disorder.  Patient is taking eliquiss.  This patient is unable to cut nails himself since the patient cannot reach his nails.These nails are painful walking and wearing shoes.  This patient presents for at risk foot care today.  General Appearance  Alert, conversant and in no acute stress.  Vascular  Dorsalis pedis and posterior tibial  pulses are  not palpable  bilaterally.  Capillary return is within normal limits  bilaterally. Temperature is within normal limits  bilaterally.  Neurologic  Senn-Weinstein monofilament wire test absent   bilaterally. Muscle power within normal limits bilaterally.  Nails Thick disfigured discolored nails with subungual debris  hallux nails  bilaterally. No evidence of bacterial infection or drainage bilaterally.  Orthopedic  No limitations of motion  feet .  No crepitus or effusions noted.  No bony pathology or digital deformities noted.  Skin  normotropic skin with no porokeratosis noted bilaterally.  No signs of infections or ulcers noted.     Onychomycosis  Pain in right toes  Pain in left toes  Consent was obtained for treatment procedures.   Mechanical debridement of nails 1-5  bilaterally performed with a nail nipper.  Filed with dremel without incident.    Return office visit    3 months                  Told patient to return for periodic foot care and evaluation due to potential at risk complications.   Daniel Ritthaler DPM  

## 2021-11-14 DIAGNOSIS — N401 Enlarged prostate with lower urinary tract symptoms: Secondary | ICD-10-CM | POA: Diagnosis not present

## 2021-11-14 DIAGNOSIS — N5089 Other specified disorders of the male genital organs: Secondary | ICD-10-CM | POA: Diagnosis not present

## 2021-11-14 DIAGNOSIS — N3941 Urge incontinence: Secondary | ICD-10-CM | POA: Diagnosis not present

## 2021-11-15 DIAGNOSIS — N5089 Other specified disorders of the male genital organs: Secondary | ICD-10-CM | POA: Diagnosis not present

## 2021-11-20 ENCOUNTER — Other Ambulatory Visit: Payer: Self-pay | Admitting: Pulmonary Disease

## 2021-11-20 MED ORDER — FLUTICASONE-SALMETEROL 250-50 MCG/ACT IN AEPB: 1.0000 | INHALATION_SPRAY | Freq: Two times a day (BID) | RESPIRATORY_TRACT | 11 refills | Status: DC

## 2021-11-24 DIAGNOSIS — N5089 Other specified disorders of the male genital organs: Secondary | ICD-10-CM | POA: Diagnosis not present
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
## 2021-12-13 ENCOUNTER — Other Ambulatory Visit: Payer: Self-pay

## 2021-12-13 ENCOUNTER — Encounter: Payer: Self-pay | Admitting: Neurology

## 2021-12-13 ENCOUNTER — Ambulatory Visit: Payer: PPO | Admitting: Neurology

## 2021-12-13 VITALS — BP 105/95 | HR 73 | Resp 18 | Wt 179.0 lb

## 2021-12-13 DIAGNOSIS — I671 Cerebral aneurysm, nonruptured: Secondary | ICD-10-CM | POA: Diagnosis not present

## 2021-12-13 DIAGNOSIS — R42 Dizziness and giddiness: Secondary | ICD-10-CM

## 2021-12-13 DIAGNOSIS — G609 Hereditary and idiopathic neuropathy, unspecified: Secondary | ICD-10-CM

## 2021-12-13 NOTE — Patient Instructions (Signed)
Follow-up in one year.

## 2021-12-25 DIAGNOSIS — M7541 Impingement syndrome of right shoulder: Secondary | ICD-10-CM | POA: Diagnosis not present

## 2021-12-25 DIAGNOSIS — M25511 Pain in right shoulder: Secondary | ICD-10-CM | POA: Diagnosis not present

## 2021-12-29 DIAGNOSIS — G4733 Obstructive sleep apnea (adult) (pediatric): Secondary | ICD-10-CM | POA: Diagnosis not present

## 2022-01-03 DIAGNOSIS — K4020 Bilateral inguinal hernia, without obstruction or gangrene, not specified as recurrent: Secondary | ICD-10-CM | POA: Diagnosis not present

## 2022-01-03 DIAGNOSIS — K4000 Bilateral inguinal hernia, with obstruction, without gangrene, not specified as recurrent: Secondary | ICD-10-CM | POA: Diagnosis not present

## 2022-01-19 ENCOUNTER — Other Ambulatory Visit (HOSPITAL_COMMUNITY): Payer: Self-pay | Admitting: Internal Medicine

## 2022-01-19 ENCOUNTER — Ambulatory Visit (HOSPITAL_COMMUNITY)
Admission: RE | Admit: 2022-01-19 | Discharge: 2022-01-19 | Disposition: A | Discharge status: Home / Self Care | Payer: PPO | Source: Ambulatory Visit | Attending: Internal Medicine | Admitting: Internal Medicine

## 2022-01-19 ENCOUNTER — Other Ambulatory Visit: Payer: Self-pay

## 2022-01-19 VITALS — BP 126/70 | HR 72 | Ht 70.0 in | Wt 178.6 lb

## 2022-01-19 DIAGNOSIS — G4733 Obstructive sleep apnea (adult) (pediatric): Secondary | ICD-10-CM | POA: Diagnosis not present

## 2022-01-19 DIAGNOSIS — Z7901 Long term (current) use of anticoagulants: Secondary | ICD-10-CM | POA: Insufficient documentation

## 2022-01-19 DIAGNOSIS — I4821 Permanent atrial fibrillation: Secondary | ICD-10-CM

## 2022-01-19 DIAGNOSIS — I110 Hypertensive heart disease with heart failure: Secondary | ICD-10-CM | POA: Diagnosis not present

## 2022-01-19 DIAGNOSIS — G4730 Sleep apnea, unspecified: Secondary | ICD-10-CM | POA: Diagnosis not present

## 2022-01-19 DIAGNOSIS — I5032 Chronic diastolic (congestive) heart failure: Secondary | ICD-10-CM | POA: Insufficient documentation

## 2022-01-19 DIAGNOSIS — Z9989 Dependence on other enabling machines and devices: Secondary | ICD-10-CM | POA: Insufficient documentation

## 2022-01-19 DIAGNOSIS — I340 Nonrheumatic mitral (valve) insufficiency: Secondary | ICD-10-CM | POA: Diagnosis not present

## 2022-01-19 DIAGNOSIS — I671 Cerebral aneurysm, nonruptured: Secondary | ICD-10-CM | POA: Diagnosis not present

## 2022-01-19 DIAGNOSIS — K4020 Bilateral inguinal hernia, without obstruction or gangrene, not specified as recurrent: Secondary | ICD-10-CM | POA: Diagnosis not present

## 2022-01-19 DIAGNOSIS — I482 Chronic atrial fibrillation, unspecified: Secondary | ICD-10-CM | POA: Diagnosis not present

## 2022-01-19 NOTE — Addendum Note (Signed)
Encounter addended by: Scarlette Calico, RN on: 01/19/2022 12:50 PM  Actions taken: Order list changed, Diagnosis association updated, Clinical Note Signed

## 2022-01-19 NOTE — Patient Instructions (Signed)
Your physician recommends that you schedule a follow-up appointment in: 6 months with an echocardiogram  

## 2022-01-19 NOTE — Progress Notes (Signed)
CARDIOLOGY CLINIC NOTE  Patient ID: Maurice Wood, male   DOB: Apr 30, 1933, 88 y.o.   MRN: [id number]  HPI:  Maurice Wood is an 88 y.o. 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  Had episode of CP in January 2011 and underwent cath which showed normal coronaries with EF45% (? artificially low due to AF). Echo  2/11: 60-65%  Echo 7/16  EF 60% Mod MR.  ECHO 2017 EF 60-65%.   Echo 11/18 EF 60-65% severe MAC. Moderate MR  He was seen in the ER in 6/21 for progressive SOB and cough. No CP. hstrop normal. (7,7). BNP 287 (in setting of chronic AF)  ECG AF 105. CXR showed mass-like density in the right infrahilar region   CT chest 06/09/20 concerning for moderate COPD and  ILD. Referred to Pulmonary   7/21 TEE to evaluate MR  EF 60-65% Normal RV  MV thickened with bileaflet prolapse 2-3+ MR  Echo  09/27/21 EF 60-65% Norma RV MV thick moderate MR. Mild to moderate AI. Personally reviewed  Here for f/u with his wife. Doing pretty well. Remains fairly active. Recently saw Surgery at [facility] bilateral inguinal hernias. Asking about risk of surgery. Says they don't bother him. Denies CP, dyspnea, orthopnea or PND.  Studies:  Echo 9/20 EF 60% mild-mod MR mild AI Severe LAE Personally reviewed  R/L cath 7/21   Ao = 129/73 (101) LV =  133/10 RA = 7 RV = 54/6 PA = 54/14 (30) PCW = 15 (V = 25) Fick cardiac output/index = 5.1/2.5 PVR = 3.0 Ao sat = 97% PA sat = 70%, 71%   Assessment: 1. Normal coronaries with left dominant system. RCA not injected but shown to be very small non-dominant vessel on previous study. LAD with small aneurysmal segment proximally but otherwise normal 2. EF 65% 3. Mild PAH with normal output. PCWP 15. V wave 22-25   PFTs 12/21: FEV1 1.87 (70%), FVC 2.90 (76%) DLCO 60%   Review of systems complete and found to be negative unless listed in HPI.   Past Medical History:   Diagnosis Date   Allergy    rhinitis   Atrial fibrillation Maurice Wood) Jan 2007   echo 1-07 normal ejection fraction, no significant valvular disease. adenosine cardiolite 1-07  no evidence of ischemia. A 48 hour Holter monitor in 7-07 showed good rate controlw/ chronic atrial fibrillation. Cath 1/11 normal cors. EF 45%. Echo 2-11 60-65%   Benign prostatic hypertrophy    Cerebral aneurysm    followed by Dr Estanislado Pandy   CHF (congestive heart failure) (HCC)    COPD (chronic obstructive pulmonary disease) (HCC)    Fatigue    History of chest pain    a. s/p LHC 05/2014 with normal cors   Hx of colonic polyps    diverticulosis   Hyperlipidemia    IBS (irritable bowel syndrome)    Incontinence    Per pt 12/14/11   Kidney stones    Obesity    Obstructive sleep apnea    noncompliant with CPAP   Retinal vein occlusion     Current Outpatient Medications  Medication Sig Dispense Refill   acetaminophen (TYLENOL) 500 MG tablet Take 500-1,000 mg by mouth every 6 (six) hours as needed for moderate pain.     albuterol (VENTOLIN HFA) 108 (90 Base) MCG/ACT inhaler Inhale 2 puffs into the lungs every 6 (six) hours as needed for wheezing or  shortness of breath. 8 g 6   azelastine (ASTELIN) 0.1 % nasal spray Place 1 spray into both nostrils 2 (two) times daily. Use in each nostril as directed     chlorpheniramine (CHLOR-TRIMETON) 4 MG tablet Take 4 mg by mouth daily as needed for allergies.      cholecalciferol (VITAMIN D3) 25 MCG (1000 UNIT) tablet Take 1,000 Units by mouth daily.     colestipol (COLESTID) 1 g tablet Take 2 g by mouth as needed.     Dextromethorphan-guaiFENesin (MUCINEX DM) 30-600 MG TB12 1 tablet as needed     ELIQUIS 5 MG TABS tablet TAKE 1 TABLET(5 MG) BY MOUTH TWICE DAILY 180 tablet 3   finasteride (PROSCAR) 5 MG tablet Take 5 mg by mouth daily.     fluticasone-salmeterol (ADVAIR) 250-50 MCG/ACT AEPB Inhale 1 puff into the lungs every 12 (twelve) hours. 60 each 11   furosemide (LASIX)  40 MG tablet Take 40 mg by mouth daily as needed.     ipratropium (ATROVENT) 0.06 % nasal spray Place 2 sprays into both nostrils as needed.     omeprazole (PRILOSEC) 20 MG capsule Take 20 mg by mouth every other day.     ondansetron (ZOFRAN) 8 MG tablet 8 mg every 8 (eight) hours as needed.     potassium chloride SA (KLOR-CON M) 20 MEQ tablet Take 20 mEq by mouth daily as needed.     vitamin B-12 (CYANOCOBALAMIN) 1000 MCG tablet Take 1,000 mcg by mouth every other day.      No current facility-administered medications for this encounter.     PHYSICAL EXAM: Vitals:   01/19/22 1135  BP: 126/70  Pulse: 72  SpO2: 95%   Wt Readings from Last 3 Encounters:  01/19/22 81 kg (178 lb 9.6 oz)  12/13/21 81.2 kg (179 lb)  09/27/21 82.6 kg (182 lb)   Physical exam: General:  Elderly Well appearing. No resp difficulty HEENT: normal Neck: supple. no JVD. Carotids 2+ bilat; no bruits. No lymphadenopathy or thryomegaly appreciated. Cor: PMI nondisplaced. Irregular rate & rhythm. Soft MR Lungs: clear Abdomen: soft, nontender, nondistended. No hepatosplenomegaly. No bruits or masses. Good bowel sounds. Extremities: no cyanosis, clubbing, rash, edema Neuro: alert & orientedx3, cranial nerves grossly intact. moves all 4 extremities w/o difficulty. Affect pleasant   Lab Results  Component Value Date   CHOL 166 02/24/2014   HDL 53.80 02/24/2014   LDLCALC 84 02/24/2014   LDLDIRECT 97.4 11/09/2011   TRIG 143.0 02/24/2014   CHOLHDL 3 02/24/2014    ASSESSMENT & PLAN:  1. Chronic diastolic HF - Echo 93/57/01 EF 60-65% l RV MV thick moderate MR. Mild to moderate AI.  - stable NYHA II - volume status looks good. Has not needed lasix  2. A-fib -  Permanent. - Rate controlled Continue eliquis 5 mg twice a day. No indication for dose reduction currently - No bleeding   3. Moderate MR - Moderate MR on echo 10/2017. EF 60-65% Severe MAC ?prolapse, but leaflets not seen well.  - TEE 7/21 with  2-3+ MR - Echo 09/27/21 EF 60-65% Norma RV MV thick moderate MR. Mild to moderate AI.  - If symptoms worsen could consider Clip - repeat echo next year  4. OSA -compliant with CPAP  5. Abnormal chest CT - Felt to have COPD +/- ILD. Has seen Dr. Halford Chessman.   6. Aortic root dilation - Echo AoRoot 4.5cm.(likely overestimated) Follow with yearly echos. Will repeat in 6 months  7. Inguinal hernias -  would be low risk from cardiac perspective for CV complications. The main this he would have to stop his Eliquis for a short period of time  Glori Bickers MD 12:24 PM

## 2022-01-24 DIAGNOSIS — L570 Actinic keratosis: Secondary | ICD-10-CM | POA: Diagnosis not present

## 2022-01-24 DIAGNOSIS — L821 Other seborrheic keratosis: Secondary | ICD-10-CM | POA: Diagnosis not present

## 2022-01-24 DIAGNOSIS — D692 Other nonthrombocytopenic purpura: Secondary | ICD-10-CM | POA: Diagnosis not present

## 2022-01-24 DIAGNOSIS — D1801 Hemangioma of skin and subcutaneous tissue: Secondary | ICD-10-CM | POA: Diagnosis not present

## 2022-01-24 DIAGNOSIS — Z85828 Personal history of other malignant neoplasm of skin: Secondary | ICD-10-CM | POA: Diagnosis not present

## 2022-02-01 ENCOUNTER — Ambulatory Visit: Payer: PPO | Admitting: Pulmonary Disease

## 2022-02-01 ENCOUNTER — Other Ambulatory Visit: Payer: Self-pay

## 2022-02-01 ENCOUNTER — Encounter: Payer: Self-pay | Admitting: Pulmonary Disease

## 2022-02-01 VITALS — BP 122/70 | HR 80 | Temp 97.9°F | Ht 70.0 in | Wt 181.4 lb

## 2022-02-01 DIAGNOSIS — J849 Interstitial pulmonary disease, unspecified: Secondary | ICD-10-CM | POA: Diagnosis not present

## 2022-02-01 DIAGNOSIS — G4733 Obstructive sleep apnea (adult) (pediatric): Secondary | ICD-10-CM | POA: Diagnosis not present

## 2022-02-01 DIAGNOSIS — Z9989 Dependence on other enabling machines and devices: Secondary | ICD-10-CM

## 2022-02-01 DIAGNOSIS — Z01811 Encounter for preprocedural respiratory examination: Secondary | ICD-10-CM | POA: Diagnosis not present

## 2022-02-01 DIAGNOSIS — J432 Centrilobular emphysema: Secondary | ICD-10-CM | POA: Diagnosis not present

## 2022-02-01 NOTE — Progress Notes (Signed)
[facility]  Chief Complaint  Patient presents with   Follow-up    Follow up.     Constitutional:  BP 122/70 (BP Location: Right Arm, Patient Position: Sitting, Cuff Size: Normal)    Pulse 80    Temp 97.9 F (36.6 C) (Oral)    Ht 5\' 10"  (1.778 m)    Wt 181 lb 6.4 oz (82.3 kg)    SpO2 95%    BMI 26.03 kg/m   Past Medical History:  Nephrolithiasis, IBS, HLD, Colon polyps, Cerebral aneurysm, BPH, A fib, Cystoid macular degeneration, Idiopathic peripheral neuropathy  Past Surgical History:  He  has a past surgical history that includes Cholecystectomy; inguinal and umbilical herniorrhaphy; Tonsillectomy and adenoidectomy (as a child); Cardiac catheterization (1999); Hernia repair; left heart catheterization with coronary angiogram (N/A, 06/01/2014); Lithotripsy; Interstim Implant placement (04/2017); TEE without cardioversion (N/A, 07/05/2020); and RIGHT/LEFT HEART CATH AND CORONARY ANGIOGRAPHY (N/A, 07/08/2020).  Brief Summary:  Maurice Wood is a 88 y.o. male former smoker with obstructive sleep apnea, COPD, lung nodule and mild ILD.      Subjective:   He is here with his wife.  Weight is steady since last visit.  He will likely need b/l inguinal hernia repair.  He gets winded when he walks to end of his driveway.  He doesn't usually do this much activity.  He feels better after resting for a few minutes.  Not having cough, wheeze, sputum, or chest congestion.  Hasn't needed to use albuterol.  Uses CPAP nightly.  Having trouble with DME about getting supplies and billing issues.  Physical Exam:   Appearance - well kempt   ENMT - no sinus tenderness, no oral exudate, no LAN, Mallampati 3 airway, no stridor  Respiratory - equal breath sounds bilaterally, no wheezing or rales  CV - s1s2 regular rate and rhythm, 2/6 SM  Ext - no clubbing, no edema  Skin - no rashes  Psych - normal mood and affect      Pulmonary testing:   PFT 08/08/20 >> FEV1 1.67 (61%), FEV1% 55, TLC 4.74 (67%), DLCO 50% PFT 12/06/20 >> FEV1 1.87 (70%), FEV1% 65, TLC 5.55 (78%), DLCO 47%  Chest Imaging:  CT chest 06/10/20 >> atherosclerosis, mod HH, 4 mm RML nodule, calcified granulomas, bronchial thickening, mild centrilobular and paraseptal emphysema, areas of GGO with mild septal thickening and thickening of peribronchovascular interstitium in lower lobes b/l HRCT chest 06/20/21 >> RML nodule 4 mm stable benign, elevated Lt diaphragm, no change in irregular peripheral interstitial opacity and septal thickening at the lung bases, including involvement of the non dependent portions of the right middle lobe and lingula  Sleep Tests:  PSG 11/09/17 >> AHI 40.8, SpO2 79% CPAP titration 03/25/18 >> CPAP 12 cm H2O >> AHI 0. CPAP 06/17/21 to 07/16/21 >> used on 29 of 30 nights with average 8 hrs 48 min.  Average AHI 0.6 with CPAP 12 cm H2O  Cardiac Tests:  Echo 09/27/21 >> EF 60 to 65%, severe LA/RA dilation, mod MR, mod AR, aortic root 45 mm  Social History:  He  reports that he quit smoking about 50 years ago. His smoking use included cigarettes. He has a 20.00 pack-year smoking history. He has never used smokeless tobacco. He reports current alcohol use of about 1.0 standard drink per week. He reports that he does not use drugs.  Family History:  His family history includes Alcohol abuse in his father; Aneurysm in his father; Cancer  in his father and maternal grandmother; Diabetes in his maternal uncle, paternal uncle, and son; Heart disease in his paternal grandfather; Sudden death in his mother.     Assessment/Plan:   COPD with emphysema. - continue advair 250 one puff bid - prn albuterol  Mild ILD. - stable on CT chest from July 2022 - monitor clinically   Obstructive sleep apnea. - he is compliant with CPAP and reports benefit from therapy - uses [employer] for his DME - continue CPAP 12 cm H2O - he should be eligible for a new CPAP  machine in 2024; he might switch DME then  Inguinal hernias. - he is being assessed for surgery - no pulmonary contraindications for him to have surgery  Permanent A fib, mild/mod MR. - followed by Dr. Pierre Bali with [facility]   Chorioretinal inflammation of Rt eye. - off prednisone, imuran - previously treated with MTX - followed by Dr. Dwana Melena with ophthalmology at [facility]   Idiopathic peripheral neuropathy. - followed by Dr. Tomi Likens with [facility]  Time Spent Involved in Patient Care on Day of Examination:  36 minutes  Follow up:   Patient Instructions  Follow up in 6 months  Medication List:   Allergies as of 02/01/2022       Reactions   Epinephrine Palpitations   Increased Heart Rate   Nsaids Other (See Comments)   NOT WHILE TAKING ELIQUIS   Clarithromycin Other (See Comments)   headache   Famotidine    Other reaction(s): NAUSEA   Iodinated Contrast Media Other (See Comments)   Pt states "turning bright red"   Nimodipine    Flushing   Other Other (See Comments)   Pt states "turning bright red"        Medication List        Accurate as of February 01, 2022  3:10 PM. If you have any questions, ask your nurse or doctor.          acetaminophen 500 MG tablet Commonly known as: TYLENOL Take 500-1,000 mg by mouth every 6 (six) hours as needed for moderate pain.   albuterol 108 (90 Base) MCG/ACT inhaler Commonly known as: VENTOLIN HFA Inhale 2 puffs into the lungs every 6 (six) hours as needed for wheezing or shortness of breath.   azelastine 0.1 % nasal spray Commonly known as: ASTELIN Place 1 spray into both nostrils 2 (two) times daily. Use in each nostril as directed   chlorpheniramine 4 MG tablet Commonly known as: CHLOR-TRIMETON Take 4 mg by mouth daily as needed for allergies.   cholecalciferol 25 MCG (1000 UNIT) tablet Commonly known as: VITAMIN D3 Take 1,000 Units by mouth daily.   colestipol 1 g tablet Commonly known  as: COLESTID Take 2 g by mouth as needed.   Eliquis 5 MG Tabs tablet Generic drug: apixaban TAKE 1 TABLET(5 MG) BY MOUTH TWICE DAILY   finasteride 5 MG tablet Commonly known as: PROSCAR Take 5 mg by mouth daily.   fluticasone-salmeterol 250-50 MCG/ACT Aepb Commonly known as: ADVAIR Inhale 1 puff into the lungs every 12 (twelve) hours.   furosemide 40 MG tablet Commonly known as: LASIX Take 40 mg by mouth daily as needed.   ipratropium 0.06 % nasal spray Commonly known as: ATROVENT Place 2 sprays into both nostrils as needed.   Mucinex DM 30-600 MG Tb12 1 tablet as needed   omeprazole 20 MG capsule Commonly known as: PRILOSEC Take 20 mg by mouth every other day.  ondansetron 8 MG tablet Commonly known as: ZOFRAN 8 mg every 8 (eight) hours as needed.   potassium chloride SA 20 MEQ tablet Commonly known as: KLOR-CON M Take 20 mEq by mouth daily as needed.   vitamin B-12 1000 MCG tablet Commonly known as: CYANOCOBALAMIN Take 1,000 mcg by mouth every other day.        Signature:  Chesley Mires, MD [place] Pager - [phone] 02/01/2022, 3:10 PM

## 2022-02-01 NOTE — Patient Instructions (Signed)
Follow up in 6 months 

## 2022-02-12 ENCOUNTER — Other Ambulatory Visit: Payer: Self-pay

## 2022-02-12 ENCOUNTER — Encounter: Payer: Self-pay | Admitting: Podiatry

## 2022-02-12 ENCOUNTER — Ambulatory Visit: Payer: PPO | Admitting: Podiatry

## 2022-02-12 DIAGNOSIS — M79675 Pain in left toe(s): Secondary | ICD-10-CM

## 2022-02-12 DIAGNOSIS — B351 Tinea unguium: Secondary | ICD-10-CM | POA: Diagnosis not present

## 2022-02-12 DIAGNOSIS — E1142 Type 2 diabetes mellitus with diabetic polyneuropathy: Secondary | ICD-10-CM | POA: Diagnosis not present

## 2022-02-12 DIAGNOSIS — D689 Coagulation defect, unspecified: Secondary | ICD-10-CM | POA: Diagnosis not present

## 2022-02-12 DIAGNOSIS — M79674 Pain in right toe(s): Secondary | ICD-10-CM | POA: Diagnosis not present

## 2022-02-12 DIAGNOSIS — G629 Polyneuropathy, unspecified: Secondary | ICD-10-CM

## 2022-02-12 NOTE — Progress Notes (Signed)
This patient returns to my office for at risk foot care.  This patient requires this care by a professional since this patient will be at risk due to having diabetic neuropathy, and coagulation disorder.  Patient is taking eliquiss.  This patient is unable to cut nails himself since the patient cannot reach his nails.These nails are painful walking and wearing shoes.  This patient presents for at risk foot care today.  General Appearance  Alert, conversant and in no acute stress.  Vascular  Dorsalis pedis and posterior tibial  pulses are  not palpable  bilaterally.  Capillary return is within normal limits  bilaterally. Temperature is within normal limits  bilaterally.  Neurologic  Senn-Weinstein monofilament wire test absent   bilaterally. Muscle power within normal limits bilaterally.  Nails Thick disfigured discolored nails with subungual debris  hallux nails  bilaterally. No evidence of bacterial infection or drainage bilaterally.  Orthopedic  No limitations of motion  feet .  No crepitus or effusions noted.  No bony pathology or digital deformities noted.  Skin  normotropic skin with no porokeratosis noted bilaterally.  No signs of infections or ulcers noted.     Onychomycosis  Pain in right toes  Pain in left toes  Consent was obtained for treatment procedures.   Mechanical debridement of nails 1-5  bilaterally performed with a nail nipper.  Filed with dremel without incident.    Return office visit    3 months                  Told patient to return for periodic foot care and evaluation due to potential at risk complications.   Deaja Rizo DPM  

## 2022-02-15 DIAGNOSIS — K219 Gastro-esophageal reflux disease without esophagitis: Secondary | ICD-10-CM | POA: Diagnosis not present

## 2022-02-15 DIAGNOSIS — K4020 Bilateral inguinal hernia, without obstruction or gangrene, not specified as recurrent: Secondary | ICD-10-CM | POA: Diagnosis not present

## 2022-02-15 DIAGNOSIS — D6869 Other thrombophilia: Secondary | ICD-10-CM | POA: Diagnosis not present

## 2022-02-15 DIAGNOSIS — I700 Atherosclerosis of aorta: Secondary | ICD-10-CM | POA: Diagnosis not present

## 2022-02-15 DIAGNOSIS — E1169 Type 2 diabetes mellitus with other specified complication: Secondary | ICD-10-CM | POA: Diagnosis not present

## 2022-02-15 DIAGNOSIS — I482 Chronic atrial fibrillation, unspecified: Secondary | ICD-10-CM | POA: Diagnosis not present

## 2022-02-15 DIAGNOSIS — J849 Interstitial pulmonary disease, unspecified: Secondary | ICD-10-CM | POA: Diagnosis not present

## 2022-02-15 DIAGNOSIS — M545 Low back pain, unspecified: Secondary | ICD-10-CM | POA: Diagnosis not present

## 2022-02-15 DIAGNOSIS — E538 Deficiency of other specified B group vitamins: Secondary | ICD-10-CM | POA: Diagnosis not present

## 2022-02-15 DIAGNOSIS — J302 Other seasonal allergic rhinitis: Secondary | ICD-10-CM | POA: Diagnosis not present

## 2022-02-15 DIAGNOSIS — R609 Edema, unspecified: Secondary | ICD-10-CM | POA: Diagnosis not present

## 2022-02-15 DIAGNOSIS — E559 Vitamin D deficiency, unspecified: Secondary | ICD-10-CM | POA: Diagnosis not present

## 2022-02-18 DIAGNOSIS — N3000 Acute cystitis without hematuria: Secondary | ICD-10-CM | POA: Diagnosis not present

## 2022-02-18 DIAGNOSIS — R300 Dysuria: Secondary | ICD-10-CM | POA: Diagnosis not present

## 2022-03-14 ENCOUNTER — Other Ambulatory Visit (HOSPITAL_COMMUNITY): Payer: Self-pay

## 2022-03-14 MED ORDER — FUROSEMIDE 40 MG PO TABS: 40.0000 mg | ORAL_TABLET | Freq: Every day | ORAL | 11 refills | Status: DC | PRN

## 2022-03-14 MED ORDER — POTASSIUM CHLORIDE CRYS ER 20 MEQ PO TBCR: 20.0000 meq | EXTENDED_RELEASE_TABLET | Freq: Every day | ORAL | 11 refills | Status: DC | PRN

## 2022-03-27 DIAGNOSIS — H35352 Cystoid macular degeneration, left eye: Secondary | ICD-10-CM | POA: Diagnosis not present

## 2022-03-29 DIAGNOSIS — G4733 Obstructive sleep apnea (adult) (pediatric): Secondary | ICD-10-CM | POA: Diagnosis not present

## 2022-04-16 DIAGNOSIS — N401 Enlarged prostate with lower urinary tract symptoms: Secondary | ICD-10-CM | POA: Diagnosis not present

## 2022-04-16 DIAGNOSIS — N3941 Urge incontinence: Secondary | ICD-10-CM | POA: Diagnosis not present

## 2022-04-23 DIAGNOSIS — Z9889 Other specified postprocedural states: Secondary | ICD-10-CM | POA: Diagnosis not present

## 2022-04-23 DIAGNOSIS — K4020 Bilateral inguinal hernia, without obstruction or gangrene, not specified as recurrent: Secondary | ICD-10-CM | POA: Diagnosis not present

## 2022-04-23 DIAGNOSIS — Z8719 Personal history of other diseases of the digestive system: Secondary | ICD-10-CM | POA: Diagnosis not present

## 2022-04-23 DIAGNOSIS — K4000 Bilateral inguinal hernia, with obstruction, without gangrene, not specified as recurrent: Secondary | ICD-10-CM | POA: Diagnosis not present

## 2022-05-15 ENCOUNTER — Encounter: Payer: Self-pay | Admitting: Podiatry

## 2022-05-15 ENCOUNTER — Ambulatory Visit: Payer: PPO | Admitting: Podiatry

## 2022-05-15 DIAGNOSIS — M79675 Pain in left toe(s): Secondary | ICD-10-CM | POA: Diagnosis not present

## 2022-05-15 DIAGNOSIS — E1142 Type 2 diabetes mellitus with diabetic polyneuropathy: Secondary | ICD-10-CM | POA: Diagnosis not present

## 2022-05-15 DIAGNOSIS — B351 Tinea unguium: Secondary | ICD-10-CM | POA: Diagnosis not present

## 2022-05-15 DIAGNOSIS — M79674 Pain in right toe(s): Secondary | ICD-10-CM | POA: Diagnosis not present

## 2022-05-15 DIAGNOSIS — D689 Coagulation defect, unspecified: Secondary | ICD-10-CM

## 2022-05-15 NOTE — Progress Notes (Signed)
This patient returns to my office for at risk foot care.  This patient requires this care by a professional since this patient will be at risk due to having diabetic neuropathy, and coagulation disorder.  Patient is taking eliquiss.  This patient is unable to cut nails himself since the patient cannot reach his nails.These nails are painful walking and wearing shoes.  This patient presents for at risk foot care today.  General Appearance  Alert, conversant and in no acute stress.  Vascular  Dorsalis pedis and posterior tibial  pulses are  not palpable  bilaterally.  Capillary return is within normal limits  bilaterally. Temperature is within normal limits  bilaterally.  Neurologic  Senn-Weinstein monofilament wire test absent   bilaterally. Muscle power within normal limits bilaterally.  Nails Thick disfigured discolored nails with subungual debris  hallux nails  bilaterally. No evidence of bacterial infection or drainage bilaterally.  Orthopedic  No limitations of motion  feet .  No crepitus or effusions noted.  No bony pathology or digital deformities noted.  Skin  normotropic skin with no porokeratosis noted bilaterally.  No signs of infections or ulcers noted.     Onychomycosis  Pain in right toes  Pain in left toes  Consent was obtained for treatment procedures.   Mechanical debridement of nails 1-5  bilaterally performed with a nail nipper.  Filed with dremel without incident.    Return office visit    3 months                  Told patient to return for periodic foot care and evaluation due to potential at risk complications.   Fatih Stalvey DPM  

## 2022-06-06 DIAGNOSIS — Z961 Presence of intraocular lens: Secondary | ICD-10-CM | POA: Diagnosis not present

## 2022-06-06 DIAGNOSIS — H35372 Puckering of macula, left eye: Secondary | ICD-10-CM | POA: Diagnosis not present

## 2022-06-06 DIAGNOSIS — H35352 Cystoid macular degeneration, left eye: Secondary | ICD-10-CM | POA: Diagnosis not present

## 2022-06-06 DIAGNOSIS — H52203 Unspecified astigmatism, bilateral: Secondary | ICD-10-CM | POA: Diagnosis not present

## 2022-06-07 ENCOUNTER — Emergency Department (HOSPITAL_BASED_OUTPATIENT_CLINIC_OR_DEPARTMENT_OTHER): Payer: PPO | Admitting: Radiology

## 2022-06-07 ENCOUNTER — Encounter (HOSPITAL_BASED_OUTPATIENT_CLINIC_OR_DEPARTMENT_OTHER): Payer: Self-pay | Admitting: Emergency Medicine

## 2022-06-07 ENCOUNTER — Emergency Department (HOSPITAL_BASED_OUTPATIENT_CLINIC_OR_DEPARTMENT_OTHER): Payer: PPO

## 2022-06-07 ENCOUNTER — Emergency Department (HOSPITAL_BASED_OUTPATIENT_CLINIC_OR_DEPARTMENT_OTHER)
Admission: EM | Admit: 2022-06-07 | Discharge: 2022-06-07 | Disposition: A | Discharge status: Home / Self Care | Payer: PPO | Attending: Emergency Medicine | Admitting: Emergency Medicine

## 2022-06-07 ENCOUNTER — Other Ambulatory Visit: Payer: Self-pay

## 2022-06-07 DIAGNOSIS — I509 Heart failure, unspecified: Secondary | ICD-10-CM | POA: Diagnosis not present

## 2022-06-07 DIAGNOSIS — Z7901 Long term (current) use of anticoagulants: Secondary | ICD-10-CM | POA: Insufficient documentation

## 2022-06-07 DIAGNOSIS — I4891 Unspecified atrial fibrillation: Secondary | ICD-10-CM | POA: Diagnosis not present

## 2022-06-07 DIAGNOSIS — J449 Chronic obstructive pulmonary disease, unspecified: Secondary | ICD-10-CM | POA: Insufficient documentation

## 2022-06-07 DIAGNOSIS — R55 Syncope and collapse: Secondary | ICD-10-CM | POA: Diagnosis not present

## 2022-06-07 DIAGNOSIS — M533 Sacrococcygeal disorders, not elsewhere classified: Secondary | ICD-10-CM | POA: Insufficient documentation

## 2022-06-07 DIAGNOSIS — W01198A Fall on same level from slipping, tripping and stumbling with subsequent striking against other object, initial encounter: Secondary | ICD-10-CM | POA: Diagnosis not present

## 2022-06-07 DIAGNOSIS — R102 Pelvic and perineal pain: Secondary | ICD-10-CM | POA: Diagnosis not present

## 2022-06-07 DIAGNOSIS — Z043 Encounter for examination and observation following other accident: Secondary | ICD-10-CM | POA: Diagnosis not present

## 2022-06-07 DIAGNOSIS — S0990XA Unspecified injury of head, initial encounter: Secondary | ICD-10-CM | POA: Diagnosis not present

## 2022-06-07 DIAGNOSIS — W19XXXA Unspecified fall, initial encounter: Secondary | ICD-10-CM

## 2022-06-07 DIAGNOSIS — M545 Low back pain, unspecified: Secondary | ICD-10-CM | POA: Insufficient documentation

## 2022-06-07 LAB — COMPREHENSIVE METABOLIC PANEL
ALT: 7 U/L (ref 0–44)
AST: 20 U/L (ref 15–41)
Albumin: 3.9 g/dL (ref 3.5–5.0)
Alkaline Phosphatase: 64 U/L (ref 38–126)
Anion gap: 13 (ref 5–15)
BUN: 14 mg/dL (ref 8–23)
CO2: 19 mmol/L — ABNORMAL LOW (ref 22–32)
Calcium: 9.3 mg/dL (ref 8.9–10.3)
Chloride: 107 mmol/L (ref 98–111)
Creatinine, Ser: 0.93 mg/dL (ref 0.61–1.24)
GFR, Estimated: >60 mL/min (ref >60)
Glucose, Bld: 108 mg/dL — ABNORMAL HIGH (ref 70–99)
Potassium: 4.2 mmol/L (ref 3.5–5.1)
Sodium: 139 mmol/L (ref 135–145)
Total Bilirubin: 0.9 mg/dL (ref 0.3–1.2)
Total Protein: 7.3 g/dL (ref 6.5–8.1)

## 2022-06-07 LAB — CBC WITH DIFFERENTIAL/PLATELET
Abs Immature Granulocytes: 0.02 10*3/uL (ref 0.00–0.07)
Basophils Absolute: 0.1 10*3/uL (ref 0.0–0.1)
Basophils Relative: 1 %
Eosinophils Absolute: 0.1 10*3/uL (ref 0.0–0.5)
Eosinophils Relative: 2 %
HCT: 43.1 % (ref 39.0–52.0)
Hemoglobin: 13.4 g/dL (ref 13.0–17.0)
Immature Granulocytes: 0 %
Lymphocytes Relative: 20 %
Lymphs Abs: 1.2 10*3/uL (ref 0.7–4.0)
MCH: 27 pg (ref 26.0–34.0)
MCHC: 31.1 g/dL (ref 30.0–36.0)
MCV: 86.7 fL (ref 80.0–100.0)
Monocytes Absolute: 0.4 10*3/uL (ref 0.1–1.0)
Monocytes Relative: 6 %
Neutro Abs: 4.4 10*3/uL (ref 1.7–7.7)
Neutrophils Relative %: 71 %
Platelets: 172 10*3/uL (ref 150–400)
RBC: 4.97 MIL/uL (ref 4.22–5.81)
RDW: 14.7 % (ref 11.5–15.5)
WBC: 6.1 10*3/uL (ref 4.0–10.5)
nRBC: 0 % (ref 0.0–0.2)

## 2022-06-07 NOTE — ED Provider Notes (Signed)
[facility] EMERGENCY DEPT Provider Note   CSN: [id number] Arrival date & time: 06/07/22  1442     History  Chief Complaint  Patient presents with   Maurice Wood is a 88 y.o. male.  HPI     88yo male with atrial fibrillation on eliquis, COPD, CHF, hyperlipidemia, cerebral aneurysm, presents with concern for fall with loss of consciousness.   Reports that he was going out to the garage and had smelled a small amount of gas and the last thing he remembers is having the thought that he should get a wash rag to clean it up, and then he found himself awake on the floor of the garage between the garage in the car.  He hit the car alarm button until his wife came.  Believes he was out for a short period of time.  He is not sure if he had an episode of syncope and woke up on the ground, or if he slipped and fell, hit his head and had loss of consciousness and amnesia to the fall.  He did not have any symptoms prior to the fall.  Reports he has been in a normal state of health, eating normally, no black or bloody stools, no nausea, no vomiting, no diarrhea, no chest pain, no shortness of breath, no lightheadedness, no fever, no chills, no abdominal pain, and no headache.  He denies any new numbness, weakness on one side of the other.  He reports a knot developed on his head.  This episode happened approximately a week ago.  He talk to his primary care doctor about it who had wanted him to have a CT scan of his head.  He has had continuing lower back pain and tailbone pain.  He has been able to ambulate without problems, just reports some pain with ambulation.  No numbness, weakness, loss of control of his bowel or bladder.  Past Medical History:  Diagnosis Date   Allergy    rhinitis   Atrial fibrillation [facility]) Jan 2007   echo 1-07 normal ejection fraction, no significant valvular disease. adenosine cardiolite 1-07  no evidence of ischemia. A 48 hour Holter monitor in 7-07  showed good rate controlw/ chronic atrial fibrillation. Cath 1/11 normal cors. EF 45%. Echo 2-11 60-65%   Benign prostatic hypertrophy    Cerebral aneurysm    followed by Dr Estanislado Pandy   CHF (congestive heart failure) (HCC)    COPD (chronic obstructive pulmonary disease) (HCC)    Fatigue    History of chest pain    a. s/p LHC 05/2014 with normal cors   Hx of colonic polyps    diverticulosis   Hyperlipidemia    IBS (irritable bowel syndrome)    Incontinence    Per pt 12/14/11   Kidney stones    Obesity    Obstructive sleep apnea    noncompliant with CPAP   Retinal vein occlusion     Home Medications Prior to Admission medications   Medication Sig Start Date End Date Taking? Authorizing Provider  acetaminophen (TYLENOL) 500 MG tablet Take 500-1,000 mg by mouth every 6 (six) hours as needed for moderate pain.    [provider]  albuterol (VENTOLIN HFA) 108 (90 Base) MCG/ACT inhaler Inhale 2 puffs into the lungs every 6 (six) hours as needed for wheezing or shortness of breath. Patient not taking: Reported on 02/01/2022 09/06/20   Martyn Ehrich, NP  azelastine (ASTELIN) 0.1 % nasal spray Place 1  spray into both nostrils 2 (two) times daily. Use in each nostril as directed Patient not taking: Reported on 02/01/2022    [provider]  chlorpheniramine (CHLOR-TRIMETON) 4 MG tablet Take 4 mg by mouth daily as needed for allergies.     [provider]  cholecalciferol (VITAMIN D3) 25 MCG (1000 UNIT) tablet Take 1,000 Units by mouth daily.    [provider]  colestipol (COLESTID) 1 g tablet Take 2 g by mouth as needed. 09/07/20   [provider]  Dextromethorphan-guaiFENesin (MUCINEX DM) 30-600 MG TB12 1 tablet as needed Patient not taking: Reported on 02/01/2022 10/08/21   [provider]  ELIQUIS 5 MG TABS tablet TAKE 1 TABLET(5 MG) BY MOUTH TWICE DAILY 01/22/22   [name], Shaune Pascal, MD  finasteride (PROSCAR) 5 MG tablet Take 5 mg by  mouth daily. 04/09/20   [provider]  fluticasone-salmeterol (ADVAIR) 250-50 MCG/ACT AEPB Inhale 1 puff into the lungs every 12 (twelve) hours. 11/20/21   Chesley Mires, MD  furosemide (LASIX) 40 MG tablet Take 1 tablet (40 mg total) by mouth daily as needed. 03/14/22   [name], Shaune Pascal, MD  ipratropium (ATROVENT) 0.06 % nasal spray Place 2 sprays into both nostrils as needed. Patient not taking: Reported on 02/01/2022 10/08/21   [provider]  omeprazole (PRILOSEC) 20 MG capsule Take 20 mg by mouth every other day. 10/04/18   [provider]  ondansetron (ZOFRAN) 8 MG tablet 8 mg every 8 (eight) hours as needed. Patient not taking: Reported on 02/01/2022 09/07/20   [provider]  potassium chloride SA (KLOR-CON M) 20 MEQ tablet Take 1 tablet (20 mEq total) by mouth daily as needed. 03/14/22   [name], Shaune Pascal, MD  vitamin B-12 (CYANOCOBALAMIN) 1000 MCG tablet Take 1,000 mcg by mouth every other day.     [provider]      Allergies    Epinephrine, Nsaids, Clarithromycin, Famotidine, Iodinated contrast media, Nimodipine, and Other    Review of Systems   Review of Systems  Physical Exam Updated Vital Signs BP (!) 147/88   Pulse 67   Temp 97.9 F (36.6 C)   Resp (!) 24   SpO2 91% Comment: pt with h/o COPD Physical Exam Vitals and nursing note reviewed.  Constitutional:      General: He is not in acute distress.    Appearance: He is well-developed. He is not diaphoretic.  HENT:     Head: Normocephalic and atraumatic.  Eyes:     Conjunctiva/sclera: Conjunctivae normal.  Cardiovascular:     Rate and Rhythm: Normal rate. Rhythm irregular.     Heart sounds: Normal heart sounds. No murmur heard.    No friction rub. No gallop.  Pulmonary:     Effort: Pulmonary effort is normal. No respiratory distress.     Breath sounds: Normal breath sounds. No wheezing or rales.  Abdominal:     General: There is no distension.     Palpations:  Abdomen is soft.     Tenderness: There is no abdominal tenderness. There is no guarding.  Musculoskeletal:        General: Tenderness (lower back, sacrum/coccyx, pain with ROM right hip) present.     Cervical back: Normal range of motion.  Skin:    General: Skin is warm and dry.  Neurological:     Mental Status: He is alert and oriented to person, place, and time.     Sensory: No sensory deficit.  Motor: No weakness.     ED Results / Procedures / Treatments   Labs (all labs ordered are listed, but only abnormal results are displayed) Labs Reviewed  COMPREHENSIVE METABOLIC PANEL - Abnormal; Notable for the following components:      Result Value   CO2 19 (*)    Glucose, Bld 108 (*)    All other components within normal limits  CBC WITH DIFFERENTIAL/PLATELET    EKG EKG Interpretation  Date/Time:  Thursday June 07 2022 18:52:09 EDT Ventricular Rate:  65 PR Interval:    QRS Duration: 97 QT Interval:  436 QTC Calculation: 454 R Axis:   11 Text Interpretation: Atrial fibrillation No significant change since last tracing Confirmed by Gareth Morgan [phone]) on 06/07/2022 8:22:47 PM  Radiology CT Cervical Spine Wo Contrast  Result Date: 06/07/2022 CLINICAL DATA:  Fall EXAM: CT CERVICAL SPINE WITHOUT CONTRAST TECHNIQUE: Multidetector CT imaging of the cervical spine was performed without intravenous contrast. Multiplanar CT image reconstructions were also generated. RADIATION DOSE REDUCTION: This exam was performed according to the departmental dose-optimization program which includes automated exposure control, adjustment of the mA and/or kV according to patient size and/or use of iterative reconstruction technique. COMPARISON:  None Available. FINDINGS: Alignment: No subluxation Skull base and vertebrae: No acute fracture. No primary bone lesion or focal pathologic process. Soft tissues and spinal canal: No prevertebral fluid or swelling. No visible canal hematoma. Disc levels:  Advanced degenerative disc disease at C5-6 and C6-7. Advanced bilateral degenerative facet disease, right greater than left. Upper chest: No acute findings Other: None IMPRESSION: Advanced degenerative disc disease and facet disease. No acute bony abnormality. Electronically Signed   By: Rolm Baptise M.D.   On: 06/07/2022 18:06   CT Head Wo Contrast  Result Date: 06/07/2022 CLINICAL DATA:  Head trauma, moderate-severe.  Fall. EXAM: CT HEAD WITHOUT CONTRAST TECHNIQUE: Contiguous axial images were obtained from the base of the skull through the vertex without intravenous contrast. RADIATION DOSE REDUCTION: This exam was performed according to the departmental dose-optimization program which includes automated exposure control, adjustment of the mA and/or kV according to patient size and/or use of iterative reconstruction technique. COMPARISON:  02/02/2020 FINDINGS: Brain: Old left cerebellar infarct. There is atrophy and chronic small vessel disease changes. No acute intracranial abnormality. Specifically, no hemorrhage, hydrocephalus, mass lesion, acute infarction, or significant intracranial injury. Vascular: No hyperdense vessel or unexpected calcification. Skull: No acute calvarial abnormality. Sinuses/Orbits: No acute findings Other: None IMPRESSION: Atrophy, chronic microvascular disease. No acute intracranial abnormality. Electronically Signed   By: Rolm Baptise M.D.   On: 06/07/2022 18:05   DG Pelvis 1-2 Views  Result Date: 06/07/2022 CLINICAL DATA:  Fall.  Pain EXAM: PELVIS - 1-2 VIEW COMPARISON:  None available. FINDINGS: Both hips are normal. No fracture.  No pelvic lesion. Electrical stimulator in the left buttock. Lead passes through the right sacrum into the right presacral space unchanged from prior studies IMPRESSION: Negative for fracture in the pelvis. Electronically Signed   By: Franchot Gallo M.D.   On: 06/07/2022 17:49   DG Lumbar Spine 2-3 Views  Result Date: 06/07/2022 CLINICAL DATA:   Fall.  Back pain. EXAM: LUMBAR SPINE - 2-3 VIEW COMPARISON:  05/05/2020 FINDINGS: Lumbar levoscoliosis. Grade 1 anterolisthesis L5-S1 unchanged. Negative for fracture Degenerative change throughout the lumbar spine from L1 through L5. Electrical stimulator with lead passing through the right sacrum into the presacral space. This is unchanged in position. IMPRESSION: Negative for lumbar fracture.  Scoliosis and degenerative change.  Electronically Signed   By: Franchot Gallo M.D.   On: 06/07/2022 17:48    Procedures Procedures    Medications Ordered in ED Medications - No data to display  ED Course/ Medical Decision Making/ A&P                            [phone] male with atrial fibrillation on eliquis, COPD, CHF, hyperlipidemia, cerebral aneurysm, presents with concern for fall with loss of consciousness.   Unclear by history if he slipped, fell and had LOC with amnesia to fall or if this was a syncopal episode.    EKG was personally evaluated by me and showed atrial fibrillation without other acute ST changes.  Labs were obtained and personally evaluated interpreted by me and showed no significant anemia, electrolyte abnormalities.  He denies any chest pain or shortness of breath and have low suspicion for ACS, PE, aortic dissection.  No history to suggest acute infection.  He is hemodynamically stable in the emergency department.  Episode happened last week without other episodes of lightheadedness or syncope.  Feels reasonable for him to follow-up with his outpatient primary care doctor and cardiologist regarding his possible syncopal episode and fall.   Given he is on anticoagulation and fell and hit his head, CT head was completed which showed no evidence of intracranial hemorrhage.  CT cervical spine completed given some pain and history of head trauma at his age.  This did not show any acute fracture or abnormalities.  X-rays of the lumbar spine and pelvis were completed which showed no  evidence of fracture or dislocation.  Have low suspicion for clinically significant occult fracture that would require surgery in the setting of him being able to walk over the last week, however discussed need for follow-up imaging if his pain continues.  Recommend continued supportive care. Patient discharged in stable condition with understanding of reasons to return.         Final Clinical Impression(s) / ED Diagnoses Final diagnoses:  Fall, initial encounter  Acute midline low back pain without sciatica  Coccyx pain  Syncope, unspecified syncope type    Rx / DC Orders ED Discharge Orders     None         Gareth Morgan, MD 06/08/22 1438

## 2022-06-07 NOTE — ED Triage Notes (Signed)
Pt fell last week hit his head/lower back and coccyx on cement. His primary wants him to have CT of head.

## 2022-06-07 NOTE — ED Notes (Addendum)
Pt agreeable with d/c plan as discussed by provider- this nurse has verbally reinforced d/c instructions and provided pt with written copy - pt acknowledges verbal understanding and denies any additional questions, concerns, needs- ambulatory with personal cane at d/c escorted by spouse - no distress; denies pain, denies dizziness

## 2022-06-25 ENCOUNTER — Encounter: Payer: Self-pay | Admitting: Neurology

## 2022-07-31 ENCOUNTER — Encounter: Payer: Self-pay | Admitting: Pulmonary Disease

## 2022-07-31 ENCOUNTER — Ambulatory Visit: Payer: PPO | Admitting: Pulmonary Disease

## 2022-07-31 VITALS — BP 118/60 | HR 64 | Ht 70.0 in | Wt 178.4 lb

## 2022-07-31 DIAGNOSIS — Z9989 Dependence on other enabling machines and devices: Secondary | ICD-10-CM | POA: Diagnosis not present

## 2022-07-31 DIAGNOSIS — G4733 Obstructive sleep apnea (adult) (pediatric): Secondary | ICD-10-CM | POA: Diagnosis not present

## 2022-07-31 DIAGNOSIS — J432 Centrilobular emphysema: Secondary | ICD-10-CM | POA: Diagnosis not present

## 2022-07-31 MED ORDER — TRELEGY ELLIPTA 100-62.5-25 MCG/ACT IN AEPB: 1.0000 | INHALATION_SPRAY | Freq: Every day | RESPIRATORY_TRACT | 5 refills | Status: DC

## 2022-07-31 MED ORDER — ALBUTEROL SULFATE HFA 108 (90 BASE) MCG/ACT IN AERS
2.0000 | INHALATION_SPRAY | Freq: Four times a day (QID) | RESPIRATORY_TRACT | 6 refills | Status: DC | PRN
Start: 2022-07-31 — End: 2023-09-17

## 2022-07-31 MED ORDER — TRELEGY ELLIPTA 100-62.5-25 MCG/ACT IN AEPB: 1.0000 | INHALATION_SPRAY | Freq: Every day | RESPIRATORY_TRACT | 0 refills | Status: DC

## 2022-07-31 NOTE — Addendum Note (Signed)
Addended by: Alvin Critchley on: 07/31/2022 04:48 PM   Modules accepted: Orders

## 2022-07-31 NOTE — Progress Notes (Signed)
Pulmonary, Critical Care, and Sleep Medicine  Chief Complaint  Patient presents with   Follow-up    Follow-up    Constitutional:  BP 118/60 (BP Location: Right Arm)   Pulse 64   Ht '5\' 10"'$  (1.778 m)   Wt 178 lb 6.4 oz (80.9 kg)   SpO2 96%   BMI 25.60 kg/m   Past Medical History:  Nephrolithiasis, IBS, HLD, Colon polyps, Cerebral aneurysm, BPH, A fib, Cystoid macular degeneration, Idiopathic peripheral neuropathy  Past Surgical History:  He  has a past surgical history that includes Cholecystectomy; inguinal and umbilical herniorrhaphy; Tonsillectomy and adenoidectomy (as a child); Cardiac catheterization (1999); Hernia repair; left heart catheterization with coronary angiogram (N/A, 06/01/2014); Lithotripsy; Interstim Implant placement (04/2017); TEE without cardioversion (N/A, 07/05/2020); and RIGHT/LEFT HEART CATH AND CORONARY ANGIOGRAPHY (N/A, 07/08/2020).  Brief Summary:  Maurice Wood is a 88 y.o. male former smoker with obstructive sleep apnea, COPD, lung nodule and mild ILD.      Subjective:   He is here with his wife.  For the past few months he has noticed getting winded more easily.  Using albuterol more and this helps.  Has occasional cough.  No sputum or wheeze.    Uses CPAP nightly w/o difficulty.  Physical Exam:   Appearance - well kempt   ENMT - no sinus tenderness, no oral exudate, no LAN, Mallampati 3 airway, no stridor  Respiratory - equal breath sounds bilaterally, no wheezing or rales  CV - s1s2 regular rate and rhythm, 2/6 SM  Ext - no clubbing, no edema  Skin - no rashes  Psych - normal mood and affect      Pulmonary testing:  PFT 08/08/20 >> FEV1 1.67 (61%), FEV1% 55, TLC 4.74 (67%), DLCO 50% PFT 12/06/20 >> FEV1 1.87 (70%), FEV1% 65, TLC 5.55 (78%), DLCO 47%  Chest Imaging:  CT chest 06/10/20 >> atherosclerosis, mod HH, 4 mm RML nodule, calcified granulomas, bronchial thickening, mild centrilobular and paraseptal emphysema,  areas of GGO with mild septal thickening and thickening of peribronchovascular interstitium in lower lobes b/l HRCT chest 06/20/21 >> RML nodule 4 mm stable benign, elevated Lt diaphragm, no change in irregular peripheral interstitial opacity and septal thickening at the lung bases, including involvement of the non dependent portions of the right middle lobe and lingula  Sleep Tests:  PSG 11/09/17 >> AHI 40.8, SpO2 79% CPAP titration 03/25/18 >> CPAP 12 cm H2O >> AHI 0. CPAP 523/23 to 07/29/22 >> used on 90 of 90 nights with average 8 hrs 23 min.  Average AHI 0.8 with CPAP 12 cm H2O  Cardiac Tests:  Echo 09/27/21 >> EF 60 to 65%, severe LA/RA dilation, mod MR, mod AR, aortic root 45 mm  Social History:  He  reports that he quit smoking about 50 years ago. His smoking use included cigarettes. He has a 20.00 pack-year smoking history. He has never used smokeless tobacco. He reports current alcohol use of about 1.0 standard drink of alcohol per week. He reports that he does not use drugs.  Family History:  His family history includes Alcohol abuse in his father; Aneurysm in his father; Cancer in his father and maternal grandmother; Diabetes in his maternal uncle, paternal uncle, and son; Heart disease in his paternal grandfather; Sudden death in his mother.     Assessment/Plan:   COPD with emphysema. - progressive symptoms - will switch from advair to trelegy 100 one puff daily - prn albuterol  Mild ILD. - stable on  CT chest from July 2022 - if dyspnea persists, then will need repeat chest imaging    Obstructive sleep apnea. - he is compliant with CPAP and reports benefit from therapy - uses [employer] for his DME - continue CPAP 12 cm H2O - he should be eligible for a new CPAP machine in 2024; he might switch DME then  Permanent A fib, mild/mod MR. - followed by Dr. Pierre Bali with [facility]   Chorioretinal inflammation of Rt eye. - off prednisone, imuran - previously treated  with MTX - followed by Dr. Dwana Melena with ophthalmology at [facility]   Idiopathic peripheral neuropathy. - followed by Dr. Tomi Likens with [facility]  Time Spent Involved in Patient Care on Day of Examination:  38 minutes  Follow up:   Patient Instructions  Trelegy one puff daily, and rinse your mouth after each use  Albuterol two puffs every 6 hours as needed for cough, wheeze, chest congestion, or shortness of breath  Stop advair  Follow up in 6 months  Medication List:   Allergies as of 07/31/2022       Reactions   Epinephrine Palpitations   Increased Heart Rate   Nsaids Other (See Comments)   NOT WHILE TAKING ELIQUIS   Clarithromycin Other (See Comments)   headache   Famotidine    Other reaction(s): NAUSEA   Iodinated Contrast Media Other (See Comments)   Pt states "turning bright red"   Nimodipine    Flushing   Other Other (See Comments)   Pt states "turning bright red"        Medication List        Accurate as of July 31, 2022 12:01 PM. If you have any questions, ask your nurse or doctor.          STOP taking these medications    fluticasone-salmeterol 250-50 MCG/ACT Aepb Commonly known as: ADVAIR Stopped by: Chesley Mires, MD       TAKE these medications    acetaminophen 500 MG tablet Commonly known as: TYLENOL Take 500-1,000 mg by mouth every 6 (six) hours as needed for moderate pain.   albuterol 108 (90 Base) MCG/ACT inhaler Commonly known as: VENTOLIN HFA Inhale 2 puffs into the lungs every 6 (six) hours as needed for wheezing or shortness of breath.   azelastine 0.1 % nasal spray Commonly known as: ASTELIN Place 1 spray into both nostrils 2 (two) times daily. Use in each nostril as directed   chlorpheniramine 4 MG tablet Commonly known as: CHLOR-TRIMETON Take 4 mg by mouth daily as needed for allergies.   cholecalciferol 25 MCG (1000 UNIT) tablet Commonly known as: VITAMIN D3 Take 1,000 Units by mouth daily.   colestipol 1 g  tablet Commonly known as: COLESTID Take 2 g by mouth as needed.   cyanocobalamin 1000 MCG tablet Commonly known as: VITAMIN B12 Take 1,000 mcg by mouth every other day.   Eliquis 5 MG Tabs tablet Generic drug: apixaban TAKE 1 TABLET(5 MG) BY MOUTH TWICE DAILY   finasteride 5 MG tablet Commonly known as: PROSCAR Take 5 mg by mouth daily.   furosemide 40 MG tablet Commonly known as: LASIX Take 1 tablet (40 mg total) by mouth daily as needed.   ipratropium 0.06 % nasal spray Commonly known as: ATROVENT Place 2 sprays into both nostrils as needed.   Mucinex DM 30-600 MG Tb12   omeprazole 20 MG capsule Commonly known as: PRILOSEC Take 20 mg by mouth every other day.   ondansetron  8 MG tablet Commonly known as: ZOFRAN 8 mg every 8 (eight) hours as needed.   potassium chloride SA 20 MEQ tablet Commonly known as: KLOR-CON M Take 1 tablet (20 mEq total) by mouth daily as needed.   Trelegy Ellipta 100-62.5-25 MCG/ACT Aepb Generic drug: Fluticasone-Umeclidin-Vilant Inhale 1 puff into the lungs daily in the afternoon. Started by: Chesley Mires, MD        Signature:  Chesley Mires, MD [place] Pager - [phone] 07/31/2022, 12:01 PM

## 2022-07-31 NOTE — Patient Instructions (Signed)
Trelegy one puff daily, and rinse your mouth after each use  Albuterol two puffs every 6 hours as needed for cough, wheeze, chest congestion, or shortness of breath  Stop advair  Follow up in 6 months

## 2022-08-05 ENCOUNTER — Encounter (HOSPITAL_BASED_OUTPATIENT_CLINIC_OR_DEPARTMENT_OTHER): Payer: Self-pay | Admitting: Emergency Medicine

## 2022-08-05 ENCOUNTER — Emergency Department (HOSPITAL_BASED_OUTPATIENT_CLINIC_OR_DEPARTMENT_OTHER)
Admission: EM | Admit: 2022-08-05 | Discharge: 2022-08-05 | Disposition: A | Discharge status: Home / Self Care | Payer: PPO | Attending: Emergency Medicine | Admitting: Emergency Medicine

## 2022-08-05 ENCOUNTER — Emergency Department (HOSPITAL_BASED_OUTPATIENT_CLINIC_OR_DEPARTMENT_OTHER): Payer: PPO | Admitting: Radiology

## 2022-08-05 ENCOUNTER — Other Ambulatory Visit: Payer: Self-pay

## 2022-08-05 DIAGNOSIS — R079 Chest pain, unspecified: Secondary | ICD-10-CM

## 2022-08-05 DIAGNOSIS — Z79899 Other long term (current) drug therapy: Secondary | ICD-10-CM | POA: Diagnosis not present

## 2022-08-05 DIAGNOSIS — R0789 Other chest pain: Secondary | ICD-10-CM | POA: Diagnosis not present

## 2022-08-05 LAB — BASIC METABOLIC PANEL
Anion gap: 9 (ref 5–15)
BUN: 17 mg/dL (ref 8–23)
CO2: 25 mmol/L (ref 22–32)
Calcium: 9.2 mg/dL (ref 8.9–10.3)
Chloride: 105 mmol/L (ref 98–111)
Creatinine, Ser: 1.15 mg/dL (ref 0.61–1.24)
GFR, Estimated: >60 mL/min (ref >60)
Glucose, Bld: 83 mg/dL (ref 70–99)
Potassium: 4 mmol/L (ref 3.5–5.1)
Sodium: 139 mmol/L (ref 135–145)

## 2022-08-05 LAB — CBC
HCT: 42.5 % (ref 39.0–52.0)
Hemoglobin: 13.6 g/dL (ref 13.0–17.0)
MCH: 27.6 pg (ref 26.0–34.0)
MCHC: 32 g/dL (ref 30.0–36.0)
MCV: 86.2 fL (ref 80.0–100.0)
Platelets: 183 10*3/uL (ref 150–400)
RBC: 4.93 MIL/uL (ref 4.22–5.81)
RDW: 14.4 % (ref 11.5–15.5)
WBC: 7 10*3/uL (ref 4.0–10.5)
nRBC: 0 % (ref 0.0–0.2)

## 2022-08-05 LAB — TROPONIN I (HIGH SENSITIVITY)
Troponin I (High Sensitivity): 9 ng/L (ref <18)
Troponin I (High Sensitivity): 9 ng/L (ref <18)

## 2022-08-05 NOTE — ED Triage Notes (Signed)
Chest pain underneath his left flank and in to center of chest for about an hour. Pt has been having intermittent chest pain for 2 weeks.

## 2022-08-05 NOTE — ED Provider Notes (Signed)
[place] EMERGENCY DEPT Provider Note   CSN: [id number] Arrival date & time: 08/05/22  1246     History {Add pertinent medical, surgical, social history, OB history to HPI:1} Chief Complaint  Patient presents with   Chest Pain    Maurice Wood is a 88 y.o. male presented to ED with chest pain.  He reports for the past several weeks he has had intermittent episodes of sharp, sudden chest pain, very often after eating dinner in the evening, on the left side of his chest towards his left shoulder blade.  He is a lasted few minutes and then go away.  Today he had a light breakfast and again had similar symptoms while he was at Sunday school.  He is currently asymptomatic.  He reports he did have some nausea last night but is not currently nauseous.  Per my review of external records the patient had a left heart catheterization in July 2021 that showed normal coronary arteries, no significant occlusion reported in the cardiology report.  LAD had a small aneurysmal sac approximately it was otherwise normal.  The patient does have some mitral regurg but it was documented as not severe by catheterization.  In October 2022 we had an echocardiogram showed a normal EF, and patient had moderate mitral regurgitation at the time.  HPI     Home Medications Prior to Admission medications   Medication Sig Start Date End Date Taking? Authorizing Provider  acetaminophen (TYLENOL) 500 MG tablet Take 500-1,000 mg by mouth every 6 (six) hours as needed for moderate pain.    [provider]  albuterol (VENTOLIN HFA) 108 (90 Base) MCG/ACT inhaler Inhale 2 puffs into the lungs every 6 (six) hours as needed for wheezing or shortness of breath. 07/31/22   Chesley Mires, MD  azelastine (ASTELIN) 0.1 % nasal spray Place 1 spray into both nostrils 2 (two) times daily. Use in each nostril as directed    [provider]  chlorpheniramine (CHLOR-TRIMETON) 4 MG tablet Take 4 mg by mouth  daily as needed for allergies.     [provider]  cholecalciferol (VITAMIN D3) 25 MCG (1000 UNIT) tablet Take 1,000 Units by mouth daily.    [provider]  colestipol (COLESTID) 1 g tablet Take 2 g by mouth as needed. 09/07/20   [provider]  Dextromethorphan-guaiFENesin ([place] DM) 30-600 MG TB12  10/08/21   [provider]  ELIQUIS 5 MG TABS tablet TAKE 1 TABLET(5 MG) BY MOUTH TWICE DAILY 01/22/22   [name], Shaune Pascal, MD  finasteride (PROSCAR) 5 MG tablet Take 5 mg by mouth daily. 04/09/20   [provider]  Fluticasone-Umeclidin-Vilant (TRELEGY ELLIPTA) 100-62.5-25 MCG/ACT AEPB Inhale 1 puff into the lungs daily in the afternoon. 07/31/22   Chesley Mires, MD  Fluticasone-Umeclidin-Vilant (TRELEGY ELLIPTA) 100-62.5-25 MCG/ACT AEPB Inhale 1 each into the lungs daily. 07/31/22   Chesley Mires, MD  furosemide (LASIX) 40 MG tablet Take 1 tablet (40 mg total) by mouth daily as needed. 03/14/22   [name], Shaune Pascal, MD  ipratropium (ATROVENT) 0.06 % nasal spray Place 2 sprays into both nostrils as needed. Patient not taking: Reported on 02/01/2022 10/08/21   [provider]  omeprazole (PRILOSEC) 20 MG capsule Take 20 mg by mouth every other day. 10/04/18   [provider]  ondansetron (ZOFRAN) 8 MG tablet 8 mg every 8 (eight) hours as needed. Patient not taking: Reported on 02/01/2022 09/07/20   [provider]  potassium chloride SA (KLOR-CON M)  20 MEQ tablet Take 1 tablet (20 mEq total) by mouth daily as needed. 03/14/22   [name], Shaune Pascal, MD  vitamin B-12 (CYANOCOBALAMIN) 1000 MCG tablet Take 1,000 mcg by mouth every other day.     [provider]      Allergies    Epinephrine, Nsaids, Clarithromycin, Famotidine, Iodinated contrast media, Nimodipine, and Other    Review of Systems   Review of Systems  Physical Exam Updated Vital Signs BP (!) 143/78 (BP Location: Left Arm)   Pulse 71   Temp 97.9 F (36.6 C)    Resp 18   SpO2 98%  Physical Exam Constitutional:      General: He is not in acute distress. HENT:     Head: Normocephalic and atraumatic.  Eyes:     Conjunctiva/sclera: Conjunctivae normal.     Pupils: Pupils are equal, round, and reactive to light.  Cardiovascular:     Rate and Rhythm: Normal rate and regular rhythm.  Pulmonary:     Effort: Pulmonary effort is normal. No respiratory distress.  Abdominal:     General: There is no distension.     Tenderness: There is no abdominal tenderness.  Skin:    General: Skin is warm and dry.  Neurological:     General: No focal deficit present.     Mental Status: He is alert. Mental status is at baseline.  Psychiatric:        Mood and Affect: Mood normal.        Behavior: Behavior normal.     ED Results / Procedures / Treatments   Labs (all labs ordered are listed, but only abnormal results are displayed) Labs Reviewed  BASIC METABOLIC PANEL  CBC  TROPONIN I (HIGH SENSITIVITY)  TROPONIN I (HIGH SENSITIVITY)    EKG EKG Interpretation  Date/Time:  Sunday August 05 2022 12:51:59 EDT Ventricular Rate:  54 PR Interval:    QRS Duration: 76 QT Interval:  414 QTC Calculation: 392 R Axis:   6 Text Interpretation: Atrial fibrillation with slow ventricular response Cannot rule out Anterior infarct , age undetermined Abnormal ECG When compared with ECG of 07-Jun-2022 18:52, PREVIOUS ECG IS PRESENT No sig change from prior tracing Confirmed by Octaviano Glow [phone]) on 08/05/2022 3:12:43 PM  Radiology DG Chest 2 View  Result Date: 08/05/2022 CLINICAL DATA:  Chest pain EXAM: CHEST - 2 VIEW COMPARISON:  04/28/2021 FINDINGS: Transverse diameter of heart is slightly increased. There are no signs of alveolar pulmonary edema. There is no focal pulmonary consolidation. There is slight prominence of interstitial markings in the parahilar regions and lower lung fields. There is possible fixed hiatal hernia. There is no pleural effusion or  pneumothorax. IMPRESSION: There are no signs of pulmonary edema or focal pulmonary consolidation. There is slight prominence of interstitial markings in the parahilar regions and lower lung fields, more so on the right side which may suggest minimal scarring Possible fixed hiatal hernia. Electronically Signed   By: Elmer Picker M.D.   On: 08/05/2022 13:31    Procedures Procedures  {Document cardiac monitor, telemetry assessment procedure when appropriate:1}  Medications Ordered in ED Medications - No data to display  ED Course/ Medical Decision Making/ A&P                           Medical Decision Making Amount and/or Complexity of Data Reviewed Labs: ordered. Radiology: ordered.   This patient presents to the ED with concern for intermittent  chest pains. This involves an extensive number of treatment options, and is a complaint that carries with it a high risk of complications and morbidity.  The differential diagnosis includes coronary vasospasm versus reflux gastritis versus pneumonia versus musculoskeletal spasm versus other  His intermittent symptoms are not consistent with pulmonary embolism.  With normal coronary arteries 2 years ago I have a low suspicion for acute coronary syndrome.  This is also not consistent with this pain which occurs at random, sharp, nonexertional.  Overall suspect this most likely consistent with a gastritis, she ready suffers from.  They report that he takes omeprazole every other day but likely would need to take it more often.  He has not tried any other over-the-counter antacid medications, and I recommend that he try this the next time he experiences pain.    He was pain-free in the ED and not requiring any medications at this time  Additional history obtained from patient's wife at bedside  External records from outside source obtained and reviewed including left heart catheterization and echocardiogram report  I ordered and personally  interpreted labs.  The pertinent results include: Labs are unremarkable  I ordered imaging studies including x-ray of the chest I independently visualized and interpreted imaging which showed acute pneumonia or pneumothorax I agree with the radiologist interpretation  The patient was maintained on a cardiac monitor.  I personally viewed and interpreted the cardiac monitored which showed an underlying rhythm of: A-fib with slower ventricular rate, chronic  Per my interpretation the patient's ECG shows A-fib with supraventricular rate, chronic, no acute ischemic findings  Test Considered: Suspicion for acute PE, the patient is appropriately anticoagulated on Eliquis.  After the interventions noted above, I reevaluated the patient and found that they have: stayed the same  Asymptomatic. Dispostion:  After consideration of the diagnostic results and the patients response to treatment, I feel that the patent would benefit from outpatient follow-up with PCP and cardiologist..   {Document critical care time when appropriate:1} {Document review of labs and clinical decision tools ie heart score, Chads2Vasc2 etc:1}  {Document your independent review of radiology images, and any outside records:1} {Document your discussion with family members, caretakers, and with consultants:1} {Document social determinants of health affecting pt's care:1} {Document your decision making why or why not admission, treatments were needed:1} Final Clinical Impression(s) / ED Diagnoses Final diagnoses:  None    Rx / DC Orders ED Discharge Orders     None

## 2022-08-15 ENCOUNTER — Ambulatory Visit: Payer: PPO | Admitting: Podiatry

## 2022-08-15 ENCOUNTER — Encounter: Payer: Self-pay | Admitting: Podiatry

## 2022-08-15 DIAGNOSIS — E1142 Type 2 diabetes mellitus with diabetic polyneuropathy: Secondary | ICD-10-CM

## 2022-08-15 DIAGNOSIS — M79674 Pain in right toe(s): Secondary | ICD-10-CM | POA: Diagnosis not present

## 2022-08-15 DIAGNOSIS — M79675 Pain in left toe(s): Secondary | ICD-10-CM | POA: Diagnosis not present

## 2022-08-15 DIAGNOSIS — B351 Tinea unguium: Secondary | ICD-10-CM | POA: Diagnosis not present

## 2022-08-15 DIAGNOSIS — K4020 Bilateral inguinal hernia, without obstruction or gangrene, not specified as recurrent: Secondary | ICD-10-CM | POA: Diagnosis not present

## 2022-08-15 DIAGNOSIS — D689 Coagulation defect, unspecified: Secondary | ICD-10-CM

## 2022-08-15 DIAGNOSIS — G629 Polyneuropathy, unspecified: Secondary | ICD-10-CM

## 2022-08-15 DIAGNOSIS — K4000 Bilateral inguinal hernia, with obstruction, without gangrene, not specified as recurrent: Secondary | ICD-10-CM | POA: Diagnosis not present

## 2022-08-15 NOTE — Progress Notes (Signed)
This patient returns to my office for at risk foot care.  This patient requires this care by a professional since this patient will be at risk due to having diabetic neuropathy, and coagulation disorder.  Patient is taking eliquiss.  This patient is unable to cut nails himself since the patient cannot reach his nails.These nails are painful walking and wearing shoes.  This patient presents for at risk foot care today.  General Appearance  Alert, conversant and in no acute stress.  Vascular  Dorsalis pedis and posterior tibial  pulses are  not palpable  bilaterally.  Capillary return is within normal limits  bilaterally. Temperature is within normal limits  bilaterally.  Neurologic  Senn-Weinstein monofilament wire test absent   bilaterally. Muscle power within normal limits bilaterally.  Nails Thick disfigured discolored nails with subungual debris  hallux nails  bilaterally. No evidence of bacterial infection or drainage bilaterally.  Orthopedic  No limitations of motion  feet .  No crepitus or effusions noted.  No bony pathology or digital deformities noted.  Skin  normotropic skin with no porokeratosis noted bilaterally.  No signs of infections or ulcers noted.     Onychomycosis  Pain in right toes  Pain in left toes  Consent was obtained for treatment procedures.   Mechanical debridement of nails 1-5  bilaterally performed with a nail nipper.  Filed with dremel without incident.    Return office visit    3 months                  Told patient to return for periodic foot care and evaluation due to potential at risk complications.   Anamarie Hunn DPM  

## 2022-08-28 DIAGNOSIS — G4733 Obstructive sleep apnea (adult) (pediatric): Secondary | ICD-10-CM | POA: Diagnosis not present

## 2022-09-26 DIAGNOSIS — Z23 Encounter for immunization: Secondary | ICD-10-CM | POA: Diagnosis not present

## 2022-10-27 DIAGNOSIS — J0141 Acute recurrent pansinusitis: Secondary | ICD-10-CM | POA: Diagnosis not present

## 2022-10-27 DIAGNOSIS — H8303 Labyrinthitis, bilateral: Secondary | ICD-10-CM | POA: Diagnosis not present

## 2022-11-13 DIAGNOSIS — R110 Nausea: Secondary | ICD-10-CM | POA: Diagnosis not present

## 2022-11-13 DIAGNOSIS — K580 Irritable bowel syndrome with diarrhea: Secondary | ICD-10-CM | POA: Diagnosis not present

## 2022-11-13 DIAGNOSIS — K219 Gastro-esophageal reflux disease without esophagitis: Secondary | ICD-10-CM | POA: Diagnosis not present

## 2022-11-14 ENCOUNTER — Ambulatory Visit: Payer: PPO | Admitting: Podiatry

## 2022-11-20 DIAGNOSIS — N390 Urinary tract infection, site not specified: Secondary | ICD-10-CM | POA: Diagnosis not present

## 2022-11-23 DIAGNOSIS — H43813 Vitreous degeneration, bilateral: Secondary | ICD-10-CM | POA: Diagnosis not present

## 2022-11-23 DIAGNOSIS — Z79899 Other long term (current) drug therapy: Secondary | ICD-10-CM | POA: Diagnosis not present

## 2022-11-23 DIAGNOSIS — Z961 Presence of intraocular lens: Secondary | ICD-10-CM | POA: Diagnosis not present

## 2022-11-23 DIAGNOSIS — H34832 Tributary (branch) retinal vein occlusion, left eye, with macular edema: Secondary | ICD-10-CM | POA: Diagnosis not present

## 2022-11-23 DIAGNOSIS — H35352 Cystoid macular degeneration, left eye: Secondary | ICD-10-CM | POA: Diagnosis not present

## 2022-11-23 DIAGNOSIS — H35372 Puckering of macula, left eye: Secondary | ICD-10-CM | POA: Diagnosis not present

## 2022-11-23 DIAGNOSIS — H30031 Focal chorioretinal inflammation, peripheral, right eye: Secondary | ICD-10-CM | POA: Diagnosis not present
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
## 2022-12-12 ENCOUNTER — Other Ambulatory Visit (HOSPITAL_COMMUNITY): Payer: Self-pay

## 2022-12-12 DIAGNOSIS — I5032 Chronic diastolic (congestive) heart failure: Secondary | ICD-10-CM

## 2022-12-13 ENCOUNTER — Ambulatory Visit: Payer: PPO | Admitting: Neurology

## 2022-12-17 ENCOUNTER — Encounter: Payer: Self-pay | Admitting: Neurology

## 2022-12-17 ENCOUNTER — Ambulatory Visit: Payer: PPO | Admitting: Neurology

## 2022-12-17 VITALS — BP 128/78 | HR 98 | Resp 20 | Ht 70.0 in | Wt 173.0 lb

## 2022-12-17 DIAGNOSIS — G609 Hereditary and idiopathic neuropathy, unspecified: Secondary | ICD-10-CM

## 2022-12-17 DIAGNOSIS — I671 Cerebral aneurysm, nonruptured: Secondary | ICD-10-CM | POA: Diagnosis not present

## 2022-12-17 DIAGNOSIS — H8110 Benign paroxysmal vertigo, unspecified ear: Secondary | ICD-10-CM | POA: Diagnosis not present

## 2022-12-31 ENCOUNTER — Ambulatory Visit: Payer: PPO | Admitting: Podiatry

## 2022-12-31 ENCOUNTER — Encounter: Payer: Self-pay | Admitting: Podiatry

## 2022-12-31 DIAGNOSIS — E1142 Type 2 diabetes mellitus with diabetic polyneuropathy: Secondary | ICD-10-CM | POA: Diagnosis not present

## 2022-12-31 DIAGNOSIS — M79674 Pain in right toe(s): Secondary | ICD-10-CM | POA: Diagnosis not present

## 2022-12-31 DIAGNOSIS — M79675 Pain in left toe(s): Secondary | ICD-10-CM

## 2022-12-31 DIAGNOSIS — B351 Tinea unguium: Secondary | ICD-10-CM

## 2022-12-31 DIAGNOSIS — D689 Coagulation defect, unspecified: Secondary | ICD-10-CM

## 2022-12-31 NOTE — Progress Notes (Signed)
This patient returns to my office for at risk foot care.  This patient requires this care by a professional since this patient will be at risk due to having diabetic neuropathy, and coagulation disorder.  Patient is taking eliquiss.  This patient is unable to cut nails himself since the patient cannot reach his nails.These nails are painful walking and wearing shoes.  This patient presents for at risk foot care today.  General Appearance  Alert, conversant and in no acute stress.  Vascular  Dorsalis pedis and posterior tibial  pulses are  not palpable  bilaterally.  Capillary return is within normal limits  bilaterally. Temperature is within normal limits  bilaterally.  Neurologic  Senn-Weinstein monofilament wire test absent   bilaterally. Muscle power within normal limits bilaterally.  Nails Thick disfigured discolored nails with subungual debris  hallux nails  bilaterally. No evidence of bacterial infection or drainage bilaterally.  Orthopedic  No limitations of motion  feet .  No crepitus or effusions noted.  No bony pathology or digital deformities noted.  Skin  normotropic skin with no porokeratosis noted bilaterally.  No signs of infections or ulcers noted.     Onychomycosis  Pain in right toes  Pain in left toes  Consent was obtained for treatment procedures.   Mechanical debridement of nails 1-5  bilaterally performed with a nail nipper.  Filed with dremel without incident.    Return office visit    3 months                  Told patient to return for periodic foot care and evaluation due to potential at risk complications.   Gardiner Barefoot DPM

## 2023-01-21 ENCOUNTER — Ambulatory Visit (INDEPENDENT_AMBULATORY_CARE_PROVIDER_SITE_OTHER): Payer: PPO | Admitting: Pulmonary Disease

## 2023-01-21 ENCOUNTER — Encounter (HOSPITAL_BASED_OUTPATIENT_CLINIC_OR_DEPARTMENT_OTHER): Payer: Self-pay | Admitting: Pulmonary Disease

## 2023-01-21 VITALS — BP 124/80 | HR 74 | Temp 97.4°F | Ht 70.0 in | Wt 166.8 lb

## 2023-01-21 DIAGNOSIS — G4733 Obstructive sleep apnea (adult) (pediatric): Secondary | ICD-10-CM | POA: Diagnosis not present

## 2023-01-21 MED ORDER — TRELEGY ELLIPTA 100-62.5-25 MCG/ACT IN AEPB: 1.0000 | INHALATION_SPRAY | Freq: Every day | RESPIRATORY_TRACT | 0 refills | Status: DC

## 2023-01-21 NOTE — Patient Instructions (Signed)
Check with your insurance about whether there are more affordable alternatives to trelegy  Will arrange for a home sleep study  Will call to arrange for follow up after sleep study reviewed

## 2023-01-21 NOTE — Progress Notes (Signed)
[place] [facility]  Chief Complaint  Patient presents with   Follow-up    Discuss inspire    Constitutional:  BP 124/80 (BP Location: Right Arm, Cuff Size: Normal)   Pulse 74   Temp (!) 97.4 F (36.3 C) (Oral)   Ht 5' 10"$  (1.778 m)   Wt 166 lb 12.8 oz (75.7 kg)   SpO2 96%   BMI 23.93 kg/m   Past Medical History:  Nephrolithiasis, IBS, HLD, Colon polyps, Cerebral aneurysm, BPH, A fib, Cystoid macular degeneration, Idiopathic peripheral neuropathy  Past Surgical History:  He  has a past surgical history that includes Cholecystectomy; inguinal and umbilical herniorrhaphy; Tonsillectomy and adenoidectomy (as a child); Cardiac catheterization (1999); Hernia repair; left heart catheterization with coronary angiogram (N/A, 06/01/2014); Lithotripsy; Interstim Implant placement (04/2017); TEE without cardioversion (N/A, 07/05/2020); and RIGHT/LEFT HEART CATH AND CORONARY ANGIOGRAPHY (N/A, 07/08/2020).  Brief Summary:  Maurice Wood is a 89 y.o. male former smoker with obstructive sleep apnea, COPD, lung nodule and mild ILD.      Subjective:   He is here with his wife and son.  He has lost about 25 lbs since he had sleep study in 2018.  He asked about the [place] device.  Trelegy has helped his breathing.  He is worried about how much this costs.    He gets winded when walking to the mailbox.  He recovers after resting a few minutes.  He is not usually very active at home.  He checks his pulse oximetry at home and usually stays above 90%.   Physical Exam:   Appearance - well kempt   ENMT - no sinus tenderness, no oral exudate, no LAN, Mallampati 3 airway, no stridor  Respiratory - equal breath sounds bilaterally, no wheezing or rales  CV - s1s2 regular rate and rhythm, 2/6  Ext - no clubbing, no edema  Skin - no rashes  Psych - normal mood and affect       Pulmonary testing:  PFT 08/08/20 >> FEV1 1.67 (61%), FEV1% 55, TLC 4.74  (67%), DLCO 50% PFT 12/06/20 >> FEV1 1.87 (70%), FEV1% 65, TLC 5.55 (78%), DLCO 47%  Chest Imaging:  CT chest 06/10/20 >> atherosclerosis, mod HH, 4 mm RML nodule, calcified granulomas, bronchial thickening, mild centrilobular and paraseptal emphysema, areas of GGO with mild septal thickening and thickening of peribronchovascular interstitium in lower lobes b/l HRCT chest 06/20/21 >> RML nodule 4 mm stable benign, elevated Lt diaphragm, no change in irregular peripheral interstitial opacity and septal thickening at the lung bases, including involvement of the non dependent portions of the right middle lobe and lingula  Sleep Tests:  PSG 11/09/17 >> AHI 40.8, SpO2 79% CPAP titration 03/25/18 >> CPAP 12 cm H2O >> AHI 0. CPAP 10/23/22 to 01/20/13 >> used on 83 of 90 nights with average 8 hrs 58 min.  Average AHI 0.7 with CPAP 12 cm H2O.  Cardiac Tests:  Echo 09/27/21 >> EF 60 to 65%, severe LA/RA dilation, mod MR, mod AR, aortic root 45 mm  Social History:  He  reports that he quit smoking about 51 years ago. His smoking use included cigarettes. He has a 20.00 pack-year smoking history. He has never used smokeless tobacco. He reports current alcohol use of about 1.0 standard drink of alcohol per week. He reports that he does not use drugs.  Family History:  His family history includes Alcohol abuse in his father; Aneurysm in his father; Cancer in his father  and maternal grandmother; Diabetes in his maternal uncle, paternal uncle, and son; Heart disease in his paternal grandfather; Sudden death in his mother.     Assessment/Plan:   COPD with emphysema. - advair wasn't effective on it's own - continue trelegy 100 one puff daily - he will check to see if there are more affordable options through his insurance - prn albuterol  Mild ILD. - stable on CT chest from July 2022 - if dyspnea persists, then will need repeat chest imaging    Obstructive sleep apnea. - he is compliant with CPAP and  reports benefit from therapy - uses [employer] for his DME - continue CPAP 12 cm H2O - he has lost significant amount of weight since his diagnosis in 2018; will arrange for home sleep study to determine the current status of his sleep apnea - discussed process of being assessed for an Inspire device  Deconditioning. - discussed how this can contribute to symptoms of dyspnea and encouraged him to try maintaining a regular exercise regimen  Permanent A fib, mild/mod MR. - followed by Dr. Pierre Bali with [facility]   Chorioretinal inflammation of Rt eye. - off prednisone, imuran - previously treated with MTX - followed by Dr. Dwana Melena with ophthalmology at [facility]   Idiopathic peripheral neuropathy. - followed by Dr. Tomi Likens with [facility]  Time Spent Involved in Patient Care on Day of Examination:  36 minutes  Follow up:   Patient Instructions  Check with your insurance about whether there are more affordable alternatives to trelegy  Will arrange for a home sleep study  Will call to arrange for follow up after sleep study reviewed   Medication List:   Allergies as of 01/21/2023       Reactions   Epinephrine Palpitations   Increased Heart Rate   Nsaids Other (See Comments)   NOT WHILE TAKING ELIQUIS   Clarithromycin Other (See Comments)   headache   Famotidine    Other reaction(s): NAUSEA   Iodinated Contrast Media Other (See Comments)   Pt states "turning bright red"   Nimodipine    Flushing   Other Other (See Comments)   Pt states "turning bright red"        Medication List        Accurate as of January 21, 2023 11:29 AM. If you have any questions, ask your nurse or doctor.          STOP taking these medications    acetaminophen 500 MG tablet Commonly known as: TYLENOL Stopped by: Chesley Mires, MD   colestipol 1 g tablet Commonly known as: COLESTID Stopped by: Chesley Mires, MD   ipratropium 0.06 % nasal spray Commonly known as:  ATROVENT Stopped by: Chesley Mires, MD   Mucinex DM 30-600 MG Tb12 Stopped by: Chesley Mires, MD   ondansetron 8 MG tablet Commonly known as: ZOFRAN Stopped by: Chesley Mires, MD       TAKE these medications    albuterol 108 (90 Base) MCG/ACT inhaler Commonly known as: VENTOLIN HFA Inhale 2 puffs into the lungs every 6 (six) hours as needed for wheezing or shortness of breath.   azelastine 0.1 % nasal spray Commonly known as: ASTELIN Place 1 spray into both nostrils 2 (two) times daily. Use in each nostril as directed   chlorpheniramine 4 MG tablet Commonly known as: CHLOR-TRIMETON Take 4 mg by mouth daily as needed for allergies.   cholecalciferol 25 MCG (1000 UNIT) tablet Commonly known as: VITAMIN D3  Take 1,000 Units by mouth daily.   cyanocobalamin 1000 MCG tablet Commonly known as: VITAMIN B12 Take 1,000 mcg by mouth every other day.   Eliquis 5 MG Tabs tablet Generic drug: apixaban TAKE 1 TABLET(5 MG) BY MOUTH TWICE DAILY   finasteride 5 MG tablet Commonly known as: PROSCAR Take 5 mg by mouth daily.   furosemide 40 MG tablet Commonly known as: LASIX Take 1 tablet (40 mg total) by mouth daily as needed.   omeprazole 20 MG capsule Commonly known as: PRILOSEC Take 20 mg by mouth every other day.   potassium chloride SA 20 MEQ tablet Commonly known as: KLOR-CON M Take 1 tablet (20 mEq total) by mouth daily as needed.   Trelegy Ellipta 100-62.5-25 MCG/ACT Aepb Generic drug: Fluticasone-Umeclidin-Vilant Inhale 1 puff into the lungs daily in the afternoon. What changed: Another medication with the same name was removed. Continue taking this medication, and follow the directions you see here. Changed by: Chesley Mires, MD        Signature:  Chesley Mires, MD [place] Pager - [phone] 01/21/2023, 11:29 AM

## 2023-01-21 NOTE — Addendum Note (Signed)
Addended by: Darliss Ridgel on: 01/21/2023 11:53 AM   Modules accepted: Orders

## 2023-01-22 DIAGNOSIS — R609 Edema, unspecified: Secondary | ICD-10-CM | POA: Diagnosis not present

## 2023-01-22 DIAGNOSIS — I482 Chronic atrial fibrillation, unspecified: Secondary | ICD-10-CM | POA: Diagnosis not present

## 2023-01-22 DIAGNOSIS — E559 Vitamin D deficiency, unspecified: Secondary | ICD-10-CM | POA: Diagnosis not present

## 2023-01-22 DIAGNOSIS — E1169 Type 2 diabetes mellitus with other specified complication: Secondary | ICD-10-CM | POA: Diagnosis not present

## 2023-01-22 DIAGNOSIS — Z0000 Encounter for general adult medical examination without abnormal findings: Secondary | ICD-10-CM | POA: Diagnosis not present

## 2023-01-22 DIAGNOSIS — M545 Low back pain, unspecified: Secondary | ICD-10-CM | POA: Diagnosis not present

## 2023-01-22 DIAGNOSIS — I700 Atherosclerosis of aorta: Secondary | ICD-10-CM | POA: Diagnosis not present

## 2023-01-22 DIAGNOSIS — E1142 Type 2 diabetes mellitus with diabetic polyneuropathy: Secondary | ICD-10-CM | POA: Diagnosis not present

## 2023-01-22 DIAGNOSIS — Z23 Encounter for immunization: Secondary | ICD-10-CM | POA: Diagnosis not present

## 2023-01-22 DIAGNOSIS — E538 Deficiency of other specified B group vitamins: Secondary | ICD-10-CM | POA: Diagnosis not present

## 2023-01-22 DIAGNOSIS — Z1331 Encounter for screening for depression: Secondary | ICD-10-CM | POA: Diagnosis not present

## 2023-01-22 DIAGNOSIS — J849 Interstitial pulmonary disease, unspecified: Secondary | ICD-10-CM | POA: Diagnosis not present

## 2023-01-22 DIAGNOSIS — D6869 Other thrombophilia: Secondary | ICD-10-CM | POA: Diagnosis not present

## 2023-01-25 ENCOUNTER — Encounter (HOSPITAL_COMMUNITY): Payer: Self-pay | Admitting: Internal Medicine

## 2023-01-25 ENCOUNTER — Ambulatory Visit (HOSPITAL_COMMUNITY)
Admission: RE | Admit: 2023-01-25 | Discharge: 2023-01-25 | Disposition: A | Discharge status: Home / Self Care | Payer: PPO | Source: Ambulatory Visit | Attending: Internal Medicine | Admitting: Internal Medicine

## 2023-01-25 ENCOUNTER — Ambulatory Visit (HOSPITAL_BASED_OUTPATIENT_CLINIC_OR_DEPARTMENT_OTHER)
Admission: RE | Admit: 2023-01-25 | Discharge: 2023-01-25 | Disposition: A | Discharge status: Home / Self Care | Payer: PPO | Source: Ambulatory Visit | Attending: Internal Medicine | Admitting: Internal Medicine

## 2023-01-25 VITALS — BP 110/70 | HR 83 | Wt 169.2 lb

## 2023-01-25 DIAGNOSIS — G4733 Obstructive sleep apnea (adult) (pediatric): Secondary | ICD-10-CM | POA: Insufficient documentation

## 2023-01-25 DIAGNOSIS — I110 Hypertensive heart disease with heart failure: Secondary | ICD-10-CM | POA: Insufficient documentation

## 2023-01-25 DIAGNOSIS — I5032 Chronic diastolic (congestive) heart failure: Secondary | ICD-10-CM

## 2023-01-25 DIAGNOSIS — I4821 Permanent atrial fibrillation: Secondary | ICD-10-CM | POA: Diagnosis not present

## 2023-01-25 DIAGNOSIS — I4891 Unspecified atrial fibrillation: Secondary | ICD-10-CM | POA: Diagnosis not present

## 2023-01-25 DIAGNOSIS — R079 Chest pain, unspecified: Secondary | ICD-10-CM | POA: Diagnosis not present

## 2023-01-25 DIAGNOSIS — I340 Nonrheumatic mitral (valve) insufficiency: Secondary | ICD-10-CM | POA: Diagnosis not present

## 2023-01-25 DIAGNOSIS — Z7901 Long term (current) use of anticoagulants: Secondary | ICD-10-CM | POA: Diagnosis not present

## 2023-01-25 DIAGNOSIS — I671 Cerebral aneurysm, nonruptured: Secondary | ICD-10-CM | POA: Insufficient documentation

## 2023-01-25 DIAGNOSIS — I482 Chronic atrial fibrillation, unspecified: Secondary | ICD-10-CM | POA: Insufficient documentation

## 2023-01-25 DIAGNOSIS — J449 Chronic obstructive pulmonary disease, unspecified: Secondary | ICD-10-CM | POA: Insufficient documentation

## 2023-01-25 LAB — ECHOCARDIOGRAM COMPLETE
AR max vel: 2.11 cm2
AV Area VTI: 2.22 cm2
AV Area mean vel: 1.8 cm2
AV Mean grad: 5 mmHg
AV Peak grad: 7.3 mmHg
AV Vena cont: 0.4 cm
Ao pk vel: 1.35 m/s
Area-P 1/2: 4.19 cm2
S' Lateral: 2.8 cm

## 2023-01-25 MED ORDER — APIXABAN 5 MG PO TABS: 5.0000 mg | ORAL_TABLET | Freq: Two times a day (BID) | ORAL | 11 refills | Status: DC

## 2023-01-25 NOTE — Progress Notes (Signed)
Medication Samples have been provided to the patient.  Drug name: Eliquis       Strength: 5 mg        Qty: 4  LOT: [id number]  Exp.Date: 08/2024  Dosing instructions: Take 1 tablet Twice daily   The patient has been instructed regarding the correct time, dose, and frequency of taking this medication, including desired effects and most common side effects.   Juanita Laster Inna Tisdell 10:44 AM 01/25/2023

## 2023-01-25 NOTE — Patient Instructions (Signed)
There has been no changes to your medications.  Your physician recommends that you schedule a follow-up appointment in: 1 year ( February 2025)  ** please call the office in December to arrange your follow up appointment. **  If you have any questions or concerns before your next appointment please send [place] a message through [place] or call our office at [phone].    TO LEAVE A MESSAGE FOR THE NURSE SELECT OPTION 2, PLEASE LEAVE A MESSAGE INCLUDING: YOUR NAME DATE OF BIRTH CALL BACK NUMBER REASON FOR CALL**this is important as we prioritize the call backs  YOU WILL RECEIVE A CALL BACK THE SAME DAY AS LONG AS YOU CALL BEFORE 4:00 PM  At the [facility], you and your health needs are our priority. As part of our continuing mission to provide you with exceptional heart care, we have created designated Provider Care Teams. These Care Teams include your primary Cardiologist (physician) and Advanced Practice Providers (APPs- Physician Assistants and Nurse Practitioners) who all work together to provide you with the care you need, when you need it.   You may see any of the following providers on your designated Care Team at your next follow up: Dr Glori Bickers Dr Loralie Champagne Dr. Roxana Hires, NP Lyda Jester, [place] [facility] [place], [place] Forestine Na, NP Audry Riles, PharmD   Please be sure to bring in all your medications bottles to every appointment.    Thank you for choosing [facility]

## 2023-01-25 NOTE — Progress Notes (Signed)
CARDIOLOGY CLINIC NOTE  Patient ID: Maurice Wood, male   DOB: [birth year], 89 y.o.   MRN: [id number]  HPI:  Maurice Wood is an 89 y.o. 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  Had episode of CP in January 2011 and underwent cath which showed normal coronaries with EF45% (? artificially low due to AF). Echo  2/11: 60-65%  Echo 7/16  EF 60% Mod MR.  ECHO 2017 EF 60-65%.   Echo 11/18 EF 60-65% severe MAC. Moderate MR  He was seen in the ER in 6/21 for progressive SOB and cough. No CP. hstrop normal. (7,7). BNP 287 (in setting of chronic AF)  ECG AF 105. CXR showed mass-like density in the right infrahilar region   CT chest 06/09/20 concerning for moderate COPD and  ILD. Referred to Pulmonary   7/21 TEE to evaluate MR  EF 60-65% Normal RV  MV thickened with bileaflet prolapse 2-3+ MR  Echo  09/27/21 EF 60-65% Norma RV MV thick moderate MR. Mild to moderate AI. Personally reviewed  Echo today 01/25/23: EF 60-65% Normal RV Mild AS/AI/MR  Here for f/u with his wife. Doing pretty well. Has SOB on some days but not too bad. Able to do ADLs. No orthopnea or PND. Had a couple episodes of CP which mostly occur after dinner. Not related to exertion. Occasional edema.   Studies:  Echo 9/20 EF 60% mild-mod MR mild AI Severe LAE Personally reviewed  R/L cath 7/21   Ao = 129/73 (101) LV =  133/10 RA = 7 RV = 54/6 PA = 54/14 (30) PCW = 15 (V = 25) Fick cardiac output/index = 5.1/2.5 PVR = 3.0 Ao sat = 97% PA sat = 70%, 71%   Assessment: 1. Normal coronaries with left dominant system. RCA not injected but shown to be very small non-dominant vessel on previous study. LAD with small aneurysmal segment proximally but otherwise normal 2. EF 65% 3. Mild PAH with normal output. PCWP 15. V wave 22-25   PFTs 12/21: FEV1 1.87 (70%), FVC 2.90 (76%) DLCO 60%   Review of systems complete and found to be  negative unless listed in HPI.   Past Medical History:  Diagnosis Date   Allergy    rhinitis   Atrial fibrillation [facility]) Jan 2007   echo 1-07 normal ejection fraction, no significant valvular disease. adenosine cardiolite 1-07  no evidence of ischemia. A 48 hour Holter monitor in 7-07 showed good rate controlw/ chronic atrial fibrillation. Cath 1/11 normal cors. EF 45%. Echo 2-11 60-65%   Benign prostatic hypertrophy    Cerebral aneurysm    followed by Dr Estanislado Pandy   CHF (congestive heart failure) (HCC)    COPD (chronic obstructive pulmonary disease) (HCC)    Fatigue    History of chest pain    a. s/p LHC 05/2014 with normal cors   Hx of colonic polyps    diverticulosis   Hyperlipidemia    IBS (irritable bowel syndrome)    Incontinence    Per pt 12/14/11   Kidney stones    Obesity    Obstructive sleep apnea    noncompliant with CPAP   Retinal vein occlusion     Current Outpatient Medications  Medication Sig Dispense Refill   albuterol (VENTOLIN HFA) 108 (90 Base) MCG/ACT inhaler Inhale 2 puffs into the lungs every 6 (six) hours as needed for wheezing or shortness of breath. 8 g  6   azelastine (ASTELIN) 0.1 % nasal spray Place 1 spray into both nostrils 2 (two) times daily. Use in each nostril as directed     chlorpheniramine (CHLOR-TRIMETON) 4 MG tablet Take 4 mg by mouth daily as needed for allergies.      cholecalciferol (VITAMIN D3) 25 MCG (1000 UNIT) tablet Take 1,000 Units by mouth daily.     ELIQUIS 5 MG TABS tablet TAKE 1 TABLET(5 MG) BY MOUTH TWICE DAILY 180 tablet 3   finasteride (PROSCAR) 5 MG tablet Take 5 mg by mouth daily.     Fluticasone-Umeclidin-Vilant (TRELEGY ELLIPTA) 100-62.5-25 MCG/ACT AEPB Inhale 1 puff into the lungs daily. 28 each 0   furosemide (LASIX) 40 MG tablet Take 1 tablet (40 mg total) by mouth daily as needed. 30 tablet 11   omeprazole (PRILOSEC) 20 MG capsule Take 20 mg by mouth every other day.     potassium chloride SA (KLOR-CON M) 20 MEQ tablet  Take 1 tablet (20 mEq total) by mouth daily as needed. 30 tablet 11   vitamin B-12 (CYANOCOBALAMIN) 1000 MCG tablet Take 1,000 mcg by mouth every other day.      No current facility-administered medications for this encounter.     PHYSICAL EXAM: Vitals:   01/25/23 0934  BP: 110/70  Pulse: 83  SpO2: 95%    Wt Readings from Last 3 Encounters:  01/25/23 76.7 kg (169 lb 3.2 oz)  01/21/23 75.7 kg (166 lb 12.8 oz)  12/17/22 78.5 kg (173 lb)   Physical exam: General:  Well appearing. No resp difficulty HEENT: normal Neck: supple. no JVD. Carotids 2+ bilat; no bruits. No lymphadenopathy or thryomegaly appreciated. Cor: PMI nondisplaced. Irregular rate & rhythm. No rubs, gallops or murmurs. Lungs: clear Abdomen: soft, nontender, nondistended. No hepatosplenomegaly. No bruits or masses. Good bowel sounds. Extremities: no cyanosis, clubbing, rash, trivial edema Neuro: alert & orientedx3, cranial nerves grossly intact. moves all 4 extremities w/o difficulty. Affect pleasant   Lab Results  Component Value Date   CHOL 166 02/24/2014   HDL 53.80 02/24/2014   LDLCALC 84 02/24/2014   LDLDIRECT 97.4 11/09/2011   TRIG 143.0 02/24/2014   CHOLHDL 3 02/24/2014    ASSESSMENT & PLAN:  1. Chronic diastolic HF - Echo 99991111 EF 60-65% l RV MV thick moderate MR. Mild to moderate AI.  - Echo today 01/25/23: EF 60-65% Normal RV Mild AS/AI/MR - stable NYHA II - volume status looks good. Has not needed lasix  2. A-fib -  Permanent. - Rate controlled Continue eliquis 5 mg twice a day. No indication for dose reduction currently - No bleeding  3. Moderate MR - Moderate MR on echo 10/2017. EF 60-65% Severe MAC ?prolapse, but leaflets not seen well.  - TEE 7/21 with 2-3+ MR - Echo 09/27/21 EF 60-65% Normal RV MV thick moderate MR. Mild to moderate AI.  - Echo today 01/25/23: EF 60-65% Normal RV Mild AS/AI/MR - Stable  4. OSA -compliant with CPAP  5. Abnormal chest CT - Felt to have COPD  +/- ILD. Has seen Dr. Halford Chessman.   6. Aortic root dilation - Echo today (01/25/23)  AoRoot 4.4cm.(likely overestimated) Follow with yearly echos.  - Stable on echo today  7. Chest pain - coronaries normal on previous - suspect GERD - can increase prilosec as needed. Let me know if progressing  Glori Bickers MD 9:49 AM

## 2023-01-25 NOTE — Addendum Note (Signed)
Encounter addended by: Stanford Scotland, RN on: 01/25/2023 10:45 AM  Actions taken: Clinical Note Signed

## 2023-01-30 DIAGNOSIS — T50Z95A Adverse effect of other vaccines and biological substances, initial encounter: Secondary | ICD-10-CM | POA: Diagnosis not present

## 2023-02-07 DIAGNOSIS — G4733 Obstructive sleep apnea (adult) (pediatric): Secondary | ICD-10-CM | POA: Diagnosis not present

## 2023-02-14 DIAGNOSIS — S51819A Laceration without foreign body of unspecified forearm, initial encounter: Secondary | ICD-10-CM | POA: Diagnosis not present

## 2023-02-20 DIAGNOSIS — G4730 Sleep apnea, unspecified: Secondary | ICD-10-CM | POA: Diagnosis not present

## 2023-02-27 DIAGNOSIS — D044 Carcinoma in situ of skin of scalp and neck: Secondary | ICD-10-CM | POA: Diagnosis not present

## 2023-02-27 DIAGNOSIS — Z85828 Personal history of other malignant neoplasm of skin: Secondary | ICD-10-CM | POA: Diagnosis not present

## 2023-02-27 DIAGNOSIS — L570 Actinic keratosis: Secondary | ICD-10-CM | POA: Diagnosis not present

## 2023-02-27 DIAGNOSIS — D0462 Carcinoma in situ of skin of left upper limb, including shoulder: Secondary | ICD-10-CM | POA: Diagnosis not present

## 2023-02-27 DIAGNOSIS — L720 Epidermal cyst: Secondary | ICD-10-CM | POA: Diagnosis not present

## 2023-02-27 DIAGNOSIS — D1801 Hemangioma of skin and subcutaneous tissue: Secondary | ICD-10-CM | POA: Diagnosis not present

## 2023-03-06 ENCOUNTER — Telehealth: Payer: Self-pay | Admitting: Pulmonary Disease

## 2023-03-06 ENCOUNTER — Encounter (INDEPENDENT_AMBULATORY_CARE_PROVIDER_SITE_OTHER): Payer: PPO

## 2023-03-06 DIAGNOSIS — G4733 Obstructive sleep apnea (adult) (pediatric): Secondary | ICD-10-CM

## 2023-03-06 NOTE — Telephone Encounter (Signed)
HST 02/20/23 >> AHI 12.7, SpO2 low 73%.   Please let him know that his sleep apnea has improved with weight loss, but he still has mild obstructive sleep apnea.  He should continue using CPAP therapy.  He would not be a candidate for an Inspire device because his sleep apnea has to be moderate or severe for this.

## 2023-03-07 NOTE — Telephone Encounter (Signed)
ATC patient. LVM to call back

## 2023-03-07 NOTE — Telephone Encounter (Signed)
Spoke with patient. Went over [employer] results with patient. Advised to keep using cpap machine. Patient verbalized understanding. Nothing further needed.

## 2023-03-11 ENCOUNTER — Telehealth (HOSPITAL_COMMUNITY): Payer: Self-pay | Admitting: Cardiology

## 2023-03-11 NOTE — Telephone Encounter (Signed)
Opened in error

## 2023-03-13 DIAGNOSIS — M7541 Impingement syndrome of right shoulder: Secondary | ICD-10-CM | POA: Diagnosis not present

## 2023-04-02 ENCOUNTER — Ambulatory Visit (INDEPENDENT_AMBULATORY_CARE_PROVIDER_SITE_OTHER): Payer: PPO | Admitting: Podiatry

## 2023-04-02 ENCOUNTER — Encounter: Payer: Self-pay | Admitting: Podiatry

## 2023-04-02 DIAGNOSIS — B351 Tinea unguium: Secondary | ICD-10-CM | POA: Diagnosis not present

## 2023-04-02 DIAGNOSIS — E1142 Type 2 diabetes mellitus with diabetic polyneuropathy: Secondary | ICD-10-CM

## 2023-04-02 DIAGNOSIS — M79674 Pain in right toe(s): Secondary | ICD-10-CM

## 2023-04-02 DIAGNOSIS — M79675 Pain in left toe(s): Secondary | ICD-10-CM | POA: Diagnosis not present

## 2023-04-02 NOTE — Progress Notes (Signed)
This patient returns to my office for at risk foot care.  This patient requires this care by a professional since this patient will be at risk due to having diabetic neuropathy, and coagulation disorder.  Patient is taking eliquiss.  This patient is unable to cut nails himself since the patient cannot reach his nails.These nails are painful walking and wearing shoes.  This patient presents for at risk foot care today.  General Appearance  Alert, conversant and in no acute stress.  Vascular  Dorsalis pedis and posterior tibial  pulses are  not palpable  bilaterally.  Capillary return is within normal limits  bilaterally. Temperature is within normal limits  bilaterally.  Neurologic  Senn-Weinstein monofilament wire test absent   bilaterally. Muscle power within normal limits bilaterally.  Nails Thick disfigured discolored nails with subungual debris  hallux nails  bilaterally. No evidence of bacterial infection or drainage bilaterally.  Orthopedic  No limitations of motion  feet .  No crepitus or effusions noted.  No bony pathology or digital deformities noted.  Skin  normotropic skin with no porokeratosis noted bilaterally.  No signs of infections or ulcers noted.     Onychomycosis  Pain in right toes  Pain in left toes  Consent was obtained for treatment procedures.   Mechanical debridement of nails 1-5  bilaterally performed with a nail nipper.  Filed with dremel without incident.    Return office visit   4 months                  Told patient to return for periodic foot care and evaluation due to potential at risk complications.   Helane Gunther DPM

## 2023-04-05 DIAGNOSIS — K529 Noninfective gastroenteritis and colitis, unspecified: Secondary | ICD-10-CM | POA: Diagnosis not present

## 2023-04-05 DIAGNOSIS — K219 Gastro-esophageal reflux disease without esophagitis: Secondary | ICD-10-CM | POA: Diagnosis not present

## 2023-04-05 DIAGNOSIS — J432 Centrilobular emphysema: Secondary | ICD-10-CM | POA: Diagnosis not present

## 2023-04-05 DIAGNOSIS — I482 Chronic atrial fibrillation, unspecified: Secondary | ICD-10-CM | POA: Diagnosis not present

## 2023-04-05 DIAGNOSIS — G8929 Other chronic pain: Secondary | ICD-10-CM | POA: Diagnosis not present

## 2023-04-25 DIAGNOSIS — R110 Nausea: Secondary | ICD-10-CM | POA: Diagnosis not present

## 2023-04-25 DIAGNOSIS — Z87898 Personal history of other specified conditions: Secondary | ICD-10-CM | POA: Diagnosis not present

## 2023-04-25 DIAGNOSIS — J432 Centrilobular emphysema: Secondary | ICD-10-CM | POA: Diagnosis not present

## 2023-04-25 DIAGNOSIS — R399 Unspecified symptoms and signs involving the genitourinary system: Secondary | ICD-10-CM | POA: Diagnosis not present

## 2023-04-25 DIAGNOSIS — I482 Chronic atrial fibrillation, unspecified: Secondary | ICD-10-CM | POA: Diagnosis not present

## 2023-04-25 DIAGNOSIS — K4020 Bilateral inguinal hernia, without obstruction or gangrene, not specified as recurrent: Secondary | ICD-10-CM | POA: Diagnosis not present

## 2023-05-02 ENCOUNTER — Other Ambulatory Visit: Payer: Self-pay | Admitting: Pulmonary Disease

## 2023-05-02 DIAGNOSIS — H6122 Impacted cerumen, left ear: Secondary | ICD-10-CM | POA: Diagnosis not present

## 2023-05-13 NOTE — Progress Notes (Signed)
CARDIOLOGY CLINIC NOTE  Patient ID: Maurice Wood, male   DOB: [birth year], 89 y.o.   MRN: [id number]  HPI:  Maurice Wood is an 89 y.o. 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  Had episode of CP in January 2011 and underwent cath which showed normal coronaries with EF45% (? artificially low due to AF). Echo  2/11: 60-65%  Echo 7/16  EF 60% Mod MR.  Echo 2017 EF 60-65%.   Echo 11/18 EF 60-65% severe MAC. Moderate MR  He was seen in the ER in 6/21 for progressive SOB and cough. No CP. Hstrop normal. (7,7). BNP 287 (in setting of chronic AF)  ECG AF 105. CXR showed mass-like density in the right infrahilar region   CT chest 06/09/20 concerning for moderate COPD and  ILD. Referred to Pulmonary   TEE (7/21) to evaluate MR,  EF 60-65% Normal RV  MV thickened with bileaflet prolapse 2-3+ MR  Echo (09/27/21): EF 60-65% Norma RV MV thick moderate MR. Mild to moderate AI.   Echo (01/25/23): EF 60-65% Normal RV Mild AS/AI/MR  Follow up 2/24 with Dr. Gala Romney, doing well. Had a couple episodes of CP, mostly occurring after dinner and not exertional. Advised to increase PPI if needed but let office know if pain persisted.  Today he returns for HF follow up with his wife, at request of his PCP. He had a syncopal event 2 weeks ago. Was walking in his garage and had fall with LOC. He does not remember anything except waking up on ground. He did not go to the ER. No dizziness or further events since. He has continued to have occasional chest discomfort, mostly in evenings after meals. Pain starts in left chest/shoulder and spreads to elbow. Pain is not exertional, resolves spontaneously. He has SOB walking on flat ground with his cane, this is his baseline. He can get around the house and do ADLs without issue. Has some swelling in feet, takes Lasix once a month. Denies palpitations, abnormal bleeding, dizziness,  or PND/Orthopnea. Appetite ok. No fever or chills. Taking all medications. He does not drive.  Cardiac Studies:  - Echo (2/24): EF 60-65% Normal RV Mild AS/AI/MR  - TEE (7/21): EF 60-65%, Normal RV,  MV thickened with bileaflet prolapse 2-3+ MR  - R/L cath 7/21   Ao = 129/73 (101) LV =  133/10 RA = 7 RV = 54/6 PA = 54/14 (30) PCW = 15 (V = 25) Fick cardiac output/index = 5.1/2.5 PVR = 3.0 Ao sat = 97% PA sat = 70%, 71%   Assessment: 1. Normal coronaries with left dominant system. RCA not injected but shown to be very small non-dominant vessel on previous study. LAD with small aneurysmal segment proximally but otherwise normal 2. EF 65% 3. Mild PAH with normal output. PCWP 15. V wave 22-25   - PFTs (12/21): FEV1 1.87 (70%), FVC 2.90 (76%) DLCO 60%  - Echo (9/20): EF 60% mild-mod MR mild AI Severe LAE      - Echo (11/18): EF 60-65% severe MAC. Moderate MR  - Echo (2017): EF 60-65%.   - Echo (7/16):  EF 60% Mod MR.  Review of systems complete and found to be negative unless listed in HPI.   Past Medical History:  Diagnosis Date   Allergy    rhinitis   Atrial fibrillation [facility]) Jan 2007   echo 1-07 normal ejection fraction, no significant  valvular disease. adenosine cardiolite 1-07  no evidence of ischemia. A 48 hour Holter monitor in 7-07 showed good rate controlw/ chronic atrial fibrillation. Cath 1/11 normal cors. EF 45%. Echo 2-11 60-65%   Benign prostatic hypertrophy    Cerebral aneurysm    followed by Dr Corliss Skains   CHF (congestive heart failure) (HCC)    COPD (chronic obstructive pulmonary disease) (HCC)    Fatigue    History of chest pain    a. s/p LHC 05/2014 with normal cors   Hx of colonic polyps    diverticulosis   Hyperlipidemia    IBS (irritable bowel syndrome)    Incontinence    Per pt 12/14/11   Kidney stones    Obesity    Obstructive sleep apnea    noncompliant with CPAP   Retinal vein occlusion    Current Outpatient Medications  Medication  Sig Dispense Refill   albuterol (VENTOLIN HFA) 108 (90 Base) MCG/ACT inhaler Inhale 2 puffs into the lungs every 6 (six) hours as needed for wheezing or shortness of breath. 8 g 6   apixaban (ELIQUIS) 5 MG TABS tablet Take 1 tablet (5 mg total) by mouth 2 (two) times daily. 60 tablet 11   azelastine (ASTELIN) 0.1 % nasal spray Place 1 spray into both nostrils 2 (two) times daily. Use in each nostril as directed as needed     chlorpheniramine (CHLOR-TRIMETON) 4 MG tablet Take 4 mg by mouth daily as needed for allergies.      cholecalciferol (VITAMIN D3) 25 MCG (1000 UNIT) tablet Take 1,000 Units by mouth daily.     finasteride (PROSCAR) 5 MG tablet Take 5 mg by mouth daily.     Fluticasone-Umeclidin-Vilant (TRELEGY ELLIPTA) 100-62.5-25 MCG/ACT AEPB INHALE 1 PUFF INTO THE LUNGS DAILY IN THE AFTERNOON 60 each 0   furosemide (LASIX) 40 MG tablet Take 1 tablet (40 mg total) by mouth daily as needed. 30 tablet 11   omeprazole (PRILOSEC) 20 MG capsule Take 20 mg by mouth every other day.     potassium chloride SA (KLOR-CON M) 20 MEQ tablet Take 1 tablet (20 mEq total) by mouth daily as needed. 30 tablet 11   UNABLE TO FIND C-Pap machine at bedtime     vitamin B-12 (CYANOCOBALAMIN) 1000 MCG tablet Take 1,000 mcg by mouth every other day.      No current facility-administered medications for this encounter.   Lab Results  Component Value Date   CHOL 166 02/24/2014   HDL 53.80 02/24/2014   LDLCALC 84 02/24/2014   LDLDIRECT 97.4 11/09/2011   TRIG 143.0 02/24/2014   CHOLHDL 3 02/24/2014   BP 118/72   Pulse 82   Wt 76.5 kg (168 lb 9.6 oz)   SpO2 93%   BMI 24.19 kg/m   Wt Readings from Last 3 Encounters:  05/15/23 76.5 kg (168 lb 9.6 oz)  01/25/23 76.7 kg (169 lb 3.2 oz)  01/21/23 75.7 kg (166 lb 12.8 oz)   Physical Exam General:  NAD. No resp difficulty, walked into clinic with cane. HEENT: Normal Neck: Supple. No JVD. Carotids 2+ bilat; no bruits. No lymphadenopathy or thryomegaly  appreciated. Cor: PMI nondisplaced. Irregular rate & rhythm. No rubs, gallops, 2/6 MR clear S2 Lungs: Clear Abdomen: Soft, nontender, nondistended. No hepatosplenomegaly. No bruits or masses. Good bowel sounds. Extremities: No cyanosis, clubbing, rash, edema Neuro: Alert & oriented x 3, cranial nerves grossly intact. Moves all 4 extremities w/o difficulty. Affect pleasant.  ECG (personally reviewed): Atrial fibrillation 71 bpm  ASSESSMENT & PLAN: Syncope - No prodrome - Worrisome for worsening valvulopathy - Place Zio AT 2 weeks today to evaluate for high-grade arrhythmias - Arrange TEE with Dr. Gala Romney to evaluate known MR and AI. Discussed risks/benefits of procedure and he agrees. Discussed with Dr. Gala Romney. - Discussed [place] DMV driving restrictions. - Consider carotid dopplers - Labs today.  2. Chronic diastolic HF - Echo 09/27/21 EF 60-65% l RV MV thick moderate MR. Mild to moderate AI.  - Echo 01/25/23: EF 60-65% Normal RV Mild AS/AI/MR - Stable NYHA II - Volume status looks good.  - Continue Lasix PRN  3. A-fib  - Permanent. Rate controlled  - Continue Eliquis 5 mg bid. No indication for dose reduction currently - No bleeding issues - CBC today.  4. Moderate MR - Echo (11/18): Moderate MR, EF 60-65%, Severe MAC ? prolapse, but leaflets not seen well.  - TEE (7/21) with 2-3+ MR - Echo (10/22): EF 60-65% Normal RV, MV thick moderate MR, mild to moderate AI.  - Echo 01/25/23: EF 60-65% Normal RV, Mild AS/AI/MR - Plan for TEE (see above), may be a candidate for MitraClip  5. OSA - Compliant with CPAP  6. Abnormal chest CT - Felt to have COPD +/- ILD.  - Has seen Dr. Craige Cotta.   7. Aortic root dilation - Echo (01/25/23) Ao Root 4.4 cm (likely overestimated). - Stable. Follow with yearly echos.   8. Chest pain - Coronaries normal on previous cath - Symptoms appear atypical. ECG today without acute changes. - Suspect GERD - Can increase prilosec as needed.   Follow  up 2 weeks after TEE.  Anderson Malta Killian Ress FNP-BC 9:11 AM

## 2023-05-13 NOTE — H&P (View-Only) (Signed)
CARDIOLOGY CLINIC NOTE  Patient ID: Maurice Wood, male   DOB: [birth year], 89 y.o.   MRN: [id number]  HPI:  Maurice Wood is an 89 y.o. 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  Had episode of CP in January 2011 and underwent cath which showed normal coronaries with EF45% (? artificially low due to AF). Echo  2/11: 60-65%  Echo 7/16  EF 60% Mod MR.  Echo 2017 EF 60-65%.   Echo 11/18 EF 60-65% severe MAC. Moderate MR  He was seen in the ER in 6/21 for progressive SOB and cough. No CP. Hstrop normal. (7,7). BNP 287 (in setting of chronic AF)  ECG AF 105. CXR showed mass-like density in the right infrahilar region   CT chest 06/09/20 concerning for moderate COPD and  ILD. Referred to Pulmonary   TEE (7/21) to evaluate MR,  EF 60-65% Normal RV  MV thickened with bileaflet prolapse 2-3+ MR  Echo (09/27/21): EF 60-65% Norma RV MV thick moderate MR. Mild to moderate AI.   Echo (01/25/23): EF 60-65% Normal RV Mild AS/AI/MR  Follow up 2/24 with Maurice Wood, doing well. Had a couple episodes of CP, mostly occurring after dinner and not exertional. Advised to increase PPI if needed but let office know if pain persisted.  Today he returns for HF follow up with his wife, at request of his PCP. He had a syncopal event 2 weeks ago. Was walking in his garage and had fall with LOC. He does not remember anything except waking up on ground. He did not go to the ER. No dizziness or further events since. He has continued to have occasional chest discomfort, mostly in evenings after meals. Pain starts in left chest/shoulder and spreads to elbow. Pain is not exertional, resolves spontaneously. He has SOB walking on flat ground with his cane, this is his baseline. He can get around the house and do ADLs without issue. Has some swelling in feet, takes Lasix once a month. Denies palpitations, abnormal bleeding, dizziness,  or PND/Orthopnea. Appetite ok. No fever or chills. Taking all medications. He does not drive.  Cardiac Studies:  - Echo (2/24): EF 60-65% Normal RV Mild AS/AI/MR  - TEE (7/21): EF 60-65%, Normal RV,  MV thickened with bileaflet prolapse 2-3+ MR  - R/L cath 7/21   Ao = 129/73 (101) LV =  133/10 RA = 7 RV = 54/6 PA = 54/14 (30) PCW = 15 (V = 25) Fick cardiac output/index = 5.1/2.5 PVR = 3.0 Ao sat = 97% PA sat = 70%, 71%   Assessment: 1. Normal coronaries with left dominant system. RCA not injected but shown to be very small non-dominant vessel on previous study. LAD with small aneurysmal segment proximally but otherwise normal 2. EF 65% 3. Mild PAH with normal output. PCWP 15. V wave 22-25   - PFTs (12/21): FEV1 1.87 (70%), FVC 2.90 (76%) DLCO 60%  - Echo (9/20): EF 60% mild-mod MR mild AI Severe LAE      - Echo (11/18): EF 60-65% severe MAC. Moderate MR  - Echo (2017): EF 60-65%.   - Echo (7/16):  EF 60% Mod MR.  Review of systems complete and found to be negative unless listed in HPI.   Past Medical History:  Diagnosis Date   Allergy    rhinitis   Atrial fibrillation [facility]) Jan 2007   echo 1-07 normal ejection fraction, no significant  valvular disease. adenosine cardiolite 1-07  no evidence of ischemia. A 48 hour Holter monitor in 7-07 showed good rate controlw/ chronic atrial fibrillation. Cath 1/11 normal cors. EF 45%. Echo 2-11 60-65%   Benign prostatic hypertrophy    Cerebral aneurysm    followed by Maurice Wood   CHF (congestive heart failure) (HCC)    COPD (chronic obstructive pulmonary disease) (HCC)    Fatigue    History of chest pain    a. s/p LHC 05/2014 with normal cors   Hx of colonic polyps    diverticulosis   Hyperlipidemia    IBS (irritable bowel syndrome)    Incontinence    Per pt 12/14/11   Kidney stones    Obesity    Obstructive sleep apnea    noncompliant with CPAP   Retinal vein occlusion    Current Outpatient Medications  Medication  Sig Dispense Refill   albuterol (VENTOLIN HFA) 108 (90 Base) MCG/ACT inhaler Inhale 2 puffs into the lungs every 6 (six) hours as needed for wheezing or shortness of breath. 8 g 6   apixaban (ELIQUIS) 5 MG TABS tablet Take 1 tablet (5 mg total) by mouth 2 (two) times daily. 60 tablet 11   azelastine (ASTELIN) 0.1 % nasal spray Place 1 spray into both nostrils 2 (two) times daily. Use in each nostril as directed as needed     chlorpheniramine (CHLOR-TRIMETON) 4 MG tablet Take 4 mg by mouth daily as needed for allergies.      cholecalciferol (VITAMIN D3) 25 MCG (1000 UNIT) tablet Take 1,000 Units by mouth daily.     finasteride (PROSCAR) 5 MG tablet Take 5 mg by mouth daily.     Fluticasone-Umeclidin-Vilant (TRELEGY ELLIPTA) 100-62.5-25 MCG/ACT AEPB INHALE 1 PUFF INTO THE LUNGS DAILY IN THE AFTERNOON 60 each 0   furosemide (LASIX) 40 MG tablet Take 1 tablet (40 mg total) by mouth daily as needed. 30 tablet 11   omeprazole (PRILOSEC) 20 MG capsule Take 20 mg by mouth every other day.     potassium chloride SA (KLOR-CON M) 20 MEQ tablet Take 1 tablet (20 mEq total) by mouth daily as needed. 30 tablet 11   UNABLE TO FIND C-Pap machine at bedtime     vitamin B-12 (CYANOCOBALAMIN) 1000 MCG tablet Take 1,000 mcg by mouth every other day.      No current facility-administered medications for this encounter.   Lab Results  Component Value Date   CHOL 166 02/24/2014   HDL 53.80 02/24/2014   LDLCALC 84 02/24/2014   LDLDIRECT 97.4 11/09/2011   TRIG 143.0 02/24/2014   CHOLHDL 3 02/24/2014   BP 118/72   Pulse 82   Wt 76.5 kg (168 lb 9.6 oz)   SpO2 93%   BMI 24.19 kg/m   Wt Readings from Last 3 Encounters:  05/15/23 76.5 kg (168 lb 9.6 oz)  01/25/23 76.7 kg (169 lb 3.2 oz)  01/21/23 75.7 kg (166 lb 12.8 oz)   Physical Exam General:  NAD. No resp difficulty, walked into clinic with cane. HEENT: Normal Neck: Supple. No JVD. Carotids 2+ bilat; no bruits. No lymphadenopathy or thryomegaly  appreciated. Cor: PMI nondisplaced. Irregular rate & rhythm. No rubs, gallops, 2/6 MR clear S2 Lungs: Clear Abdomen: Soft, nontender, nondistended. No hepatosplenomegaly. No bruits or masses. Good bowel sounds. Extremities: No cyanosis, clubbing, rash, edema Neuro: Alert & oriented x 3, cranial nerves grossly intact. Moves all 4 extremities w/o difficulty. Affect pleasant.  ECG (personally reviewed): Atrial fibrillation 71 bpm  ASSESSMENT & PLAN: Syncope - No prodrome - Worrisome for worsening valvulopathy - Place Zio AT 2 weeks today to evaluate for high-grade arrhythmias - Arrange TEE with Maurice Wood to evaluate known MR and AI. Discussed risks/benefits of procedure and he agrees. Discussed with Maurice Wood. - Discussed [place] DMV driving restrictions. - Consider carotid dopplers - Labs today.  2. Chronic diastolic HF - Echo 09/27/21 EF 60-65% l RV MV thick moderate MR. Mild to moderate AI.  - Echo 01/25/23: EF 60-65% Normal RV Mild AS/AI/MR - Stable NYHA II - Volume status looks good.  - Continue Lasix PRN  3. A-fib  - Permanent. Rate controlled  - Continue Eliquis 5 mg bid. No indication for dose reduction currently - No bleeding issues - CBC today.  4. Moderate MR - Echo (11/18): Moderate MR, EF 60-65%, Severe MAC ? prolapse, but leaflets not seen well.  - TEE (7/21) with 2-3+ MR - Echo (10/22): EF 60-65% Normal RV, MV thick moderate MR, mild to moderate AI.  - Echo 01/25/23: EF 60-65% Normal RV, Mild AS/AI/MR - Plan for TEE (see above), may be a candidate for MitraClip  5. OSA - Compliant with CPAP  6. Abnormal chest CT - Felt to have COPD +/- ILD.  - Has seen Maurice. Craige Cotta.   7. Aortic root dilation - Echo (01/25/23) Ao Root 4.4 cm (likely overestimated). - Stable. Follow with yearly echos.   8. Chest pain - Coronaries normal on previous cath - Symptoms appear atypical. ECG today without acute changes. - Suspect GERD - Can increase prilosec as needed.   Follow  up 2 weeks after TEE.  Anderson Malta Donnis Phaneuf FNP-BC 9:11 AM

## 2023-05-15 ENCOUNTER — Other Ambulatory Visit (HOSPITAL_COMMUNITY): Payer: Self-pay

## 2023-05-15 ENCOUNTER — Inpatient Hospital Stay (HOSPITAL_COMMUNITY)
Admission: RE | Admit: 2023-05-15 | Discharge: 2023-05-15 | Disposition: A | Discharge status: Home / Self Care | Payer: PPO | Source: Ambulatory Visit | Attending: Internal Medicine | Admitting: Internal Medicine

## 2023-05-15 ENCOUNTER — Other Ambulatory Visit (HOSPITAL_COMMUNITY): Payer: Self-pay | Admitting: Internal Medicine

## 2023-05-15 ENCOUNTER — Ambulatory Visit (HOSPITAL_COMMUNITY)
Admission: RE | Admit: 2023-05-15 | Discharge: 2023-05-15 | Disposition: A | Discharge status: Home / Self Care | Payer: PPO | Source: Ambulatory Visit | Attending: Family Medicine | Admitting: Family Medicine

## 2023-05-15 ENCOUNTER — Telehealth (HOSPITAL_COMMUNITY): Payer: Self-pay | Admitting: Cardiology

## 2023-05-15 ENCOUNTER — Encounter (HOSPITAL_COMMUNITY): Payer: Self-pay

## 2023-05-15 VITALS — BP 118/72 | HR 82 | Wt 168.6 lb

## 2023-05-15 DIAGNOSIS — I110 Hypertensive heart disease with heart failure: Secondary | ICD-10-CM | POA: Diagnosis not present

## 2023-05-15 DIAGNOSIS — I5032 Chronic diastolic (congestive) heart failure: Secondary | ICD-10-CM | POA: Insufficient documentation

## 2023-05-15 DIAGNOSIS — G4733 Obstructive sleep apnea (adult) (pediatric): Secondary | ICD-10-CM | POA: Diagnosis not present

## 2023-05-15 DIAGNOSIS — I671 Cerebral aneurysm, nonruptured: Secondary | ICD-10-CM | POA: Insufficient documentation

## 2023-05-15 DIAGNOSIS — R0602 Shortness of breath: Secondary | ICD-10-CM | POA: Insufficient documentation

## 2023-05-15 DIAGNOSIS — I4821 Permanent atrial fibrillation: Secondary | ICD-10-CM | POA: Insufficient documentation

## 2023-05-15 DIAGNOSIS — I080 Rheumatic disorders of both mitral and aortic valves: Secondary | ICD-10-CM | POA: Insufficient documentation

## 2023-05-15 DIAGNOSIS — M7989 Other specified soft tissue disorders: Secondary | ICD-10-CM | POA: Insufficient documentation

## 2023-05-15 DIAGNOSIS — Z7901 Long term (current) use of anticoagulants: Secondary | ICD-10-CM | POA: Diagnosis not present

## 2023-05-15 DIAGNOSIS — I340 Nonrheumatic mitral (valve) insufficiency: Secondary | ICD-10-CM

## 2023-05-15 DIAGNOSIS — R9389 Abnormal findings on diagnostic imaging of other specified body structures: Secondary | ICD-10-CM | POA: Diagnosis not present

## 2023-05-15 DIAGNOSIS — I77810 Thoracic aortic ectasia: Secondary | ICD-10-CM | POA: Diagnosis not present

## 2023-05-15 DIAGNOSIS — Z79899 Other long term (current) drug therapy: Secondary | ICD-10-CM | POA: Insufficient documentation

## 2023-05-15 DIAGNOSIS — R55 Syncope and collapse: Secondary | ICD-10-CM | POA: Diagnosis not present

## 2023-05-15 DIAGNOSIS — I351 Nonrheumatic aortic (valve) insufficiency: Secondary | ICD-10-CM

## 2023-05-15 DIAGNOSIS — R079 Chest pain, unspecified: Secondary | ICD-10-CM | POA: Insufficient documentation

## 2023-05-15 LAB — CBC
HCT: 42.5 % (ref 39.0–52.0)
Hemoglobin: 13.3 g/dL (ref 13.0–17.0)
MCH: 27.8 pg (ref 26.0–34.0)
MCHC: 31.3 g/dL (ref 30.0–36.0)
MCV: 88.9 fL (ref 80.0–100.0)
Platelets: 174 10*3/uL (ref 150–400)
RBC: 4.78 MIL/uL (ref 4.22–5.81)
RDW: 15.6 % — ABNORMAL HIGH (ref 11.5–15.5)
WBC: 6.9 10*3/uL (ref 4.0–10.5)
nRBC: 0 % (ref 0.0–0.2)

## 2023-05-15 LAB — BASIC METABOLIC PANEL
Anion gap: 8 (ref 5–15)
BUN: 12 mg/dL (ref 8–23)
CO2: 24 mmol/L (ref 22–32)
Calcium: 9.2 mg/dL (ref 8.9–10.3)
Chloride: 108 mmol/L (ref 98–111)
Creatinine, Ser: 1.03 mg/dL (ref 0.61–1.24)
GFR, Estimated: >60 mL/min (ref >60)
Glucose, Bld: 134 mg/dL — ABNORMAL HIGH (ref 70–99)
Potassium: 4.2 mmol/L (ref 3.5–5.1)
Sodium: 140 mmol/L (ref 135–145)

## 2023-05-15 NOTE — Telephone Encounter (Signed)
i[employer] (Zio) called with alert  first documented AFib @ 1134   Zio placed at OV and noted permanent afib-rate controlled -eliquis 5 BID    Message to provider as Lorain Childes

## 2023-05-15 NOTE — Patient Instructions (Signed)
Medication Changes:  We recommend that you continue on your current medications as directed. Please refer to the Current Medication list given to you today.   *If you need a refill on your cardiac medications before your next appointment, please call your pharmacy*  Lab Work:  Labs done today, your results will be available in MyChart, we will contact you for abnormal readings.  Testing/Procedures:  You are scheduled for a TEE/Cardioversion/TEE Cardioversion on 06/11 with Dr. Gala Romney.  Please arrive at the [facility] (Main Entrance A) at [facility] [address] [place], [place] [address] at 0630 am/pm. (1 hour prior to procedure unless lab work is needed; if lab work is needed arrive 1.5 hours ahead)  DIET: Nothing to eat or drink after midnight except a sip of water with medications (see medication instructions below)  You must have a responsible person to drive you home and stay in the waiting area during your procedure. Failure to do so could result in cancellation.  Bring your insurance cards.  Your provider has recommended that  you wear a Zio Patch for 14 days.  This monitor will record your heart rhythm for our review.  IF you have any symptoms while wearing the monitor please press the button.  If you have any issues with the patch or you notice a red or orange light on it please call the company at [phone].  Once you remove the patch please mail it back to the company as soon as possible so we can get the results.    Follow-Up in:   Your physician recommends that you schedule a follow-up appointment in: 2-3 weeks    Do the following things EVERYDAY: Weigh yourself in the morning before breakfast. Write it down and keep it in a log. Take your medicines as prescribed Eat low salt foods--Limit salt (sodium) to 2000 mg per day.  Stay as active as you can everyday Limit all fluids for the day to less than 2 liters    Need to Contact us:  If you have any  questions or concerns before your next appointment please send [place] a message through [place] or call our office at [phone].    TO LEAVE A MESSAGE FOR THE NURSE SELECT OPTION 2, PLEASE LEAVE A MESSAGE INCLUDING: YOUR NAME DATE OF BIRTH CALL BACK NUMBER REASON FOR CALL**this is important as we prioritize the call backs  YOU WILL RECEIVE A CALL BACK THE SAME DAY AS LONG AS YOU CALL BEFORE 4:00 PM   At the [facility], you and your health needs are our priority. As part of our continuing mission to provide you with exceptional heart care, we have created designated Provider Care Teams. These Care Teams include your primary Cardiologist (physician) and Advanced Practice Providers (APPs- Physician Assistants and Nurse Practitioners) who all work together to provide you with the care you need, when you need it.   You may see any of the following providers on your designated Care Team at your next follow up: Dr Arvilla Meres Dr Marca Ancona Dr. Marcos Eke, NP Robbie Lis, [name] [facility] [place], [name] Brynda Peon, NP Karle Plumber, PharmD   Please be sure to bring in all your medications bottles to every appointment.    Thank you for choosing [facility]

## 2023-05-15 NOTE — Progress Notes (Signed)
Zio patch placed onto patient.  All instructions and information reviewed with patient, they verbalize understanding with no questions. 

## 2023-05-16 DIAGNOSIS — R55 Syncope and collapse: Secondary | ICD-10-CM | POA: Diagnosis not present

## 2023-05-20 ENCOUNTER — Telehealth (HOSPITAL_COMMUNITY): Payer: Self-pay

## 2023-05-20 NOTE — Telephone Encounter (Signed)
Spoke to patient regarding Tee scheduled for tomorrow. Aware of time and pace. Nothing to eat or drink after midnight.Hold Lasix in the am.Continue Eliquis

## 2023-05-20 NOTE — Progress Notes (Signed)
Spoke to patient and wife on the phone regarding TEE - instructed patient to arrive at 0630, NPO after midnight Confirmed patient has ride home and responsible person to stay with patient for 24 hours after the procedure Instructed patient to take meds in the AM with a sip of water

## 2023-05-21 ENCOUNTER — Ambulatory Visit (HOSPITAL_COMMUNITY)
Admission: RE | Admit: 2023-05-21 | Discharge: 2023-05-21 | Disposition: A | Discharge status: Home / Self Care | Payer: PPO | Attending: Internal Medicine | Admitting: Internal Medicine

## 2023-05-21 ENCOUNTER — Ambulatory Visit (HOSPITAL_BASED_OUTPATIENT_CLINIC_OR_DEPARTMENT_OTHER): Payer: PPO | Admitting: Certified Registered"

## 2023-05-21 ENCOUNTER — Encounter (HOSPITAL_COMMUNITY)
Admission: RE | Disposition: A | Discharge status: Home / Self Care | Payer: Self-pay | Source: Home / Self Care | Attending: Internal Medicine

## 2023-05-21 ENCOUNTER — Other Ambulatory Visit: Payer: Self-pay

## 2023-05-21 ENCOUNTER — Encounter (HOSPITAL_COMMUNITY): Payer: Self-pay | Admitting: Internal Medicine

## 2023-05-21 ENCOUNTER — Ambulatory Visit (HOSPITAL_COMMUNITY): Payer: PPO | Admitting: Certified Registered"

## 2023-05-21 ENCOUNTER — Ambulatory Visit (HOSPITAL_BASED_OUTPATIENT_CLINIC_OR_DEPARTMENT_OTHER): Payer: PPO

## 2023-05-21 DIAGNOSIS — I5032 Chronic diastolic (congestive) heart failure: Secondary | ICD-10-CM | POA: Diagnosis not present

## 2023-05-21 DIAGNOSIS — I340 Nonrheumatic mitral (valve) insufficiency: Secondary | ICD-10-CM

## 2023-05-21 DIAGNOSIS — Z7901 Long term (current) use of anticoagulants: Secondary | ICD-10-CM | POA: Insufficient documentation

## 2023-05-21 DIAGNOSIS — I110 Hypertensive heart disease with heart failure: Secondary | ICD-10-CM | POA: Insufficient documentation

## 2023-05-21 DIAGNOSIS — I080 Rheumatic disorders of both mitral and aortic valves: Secondary | ICD-10-CM | POA: Diagnosis not present

## 2023-05-21 DIAGNOSIS — I342 Nonrheumatic mitral (valve) stenosis: Secondary | ICD-10-CM

## 2023-05-21 DIAGNOSIS — R55 Syncope and collapse: Secondary | ICD-10-CM | POA: Diagnosis not present

## 2023-05-21 DIAGNOSIS — Z87891 Personal history of nicotine dependence: Secondary | ICD-10-CM | POA: Diagnosis not present

## 2023-05-21 DIAGNOSIS — G4733 Obstructive sleep apnea (adult) (pediatric): Secondary | ICD-10-CM | POA: Insufficient documentation

## 2023-05-21 DIAGNOSIS — I10 Essential (primary) hypertension: Secondary | ICD-10-CM

## 2023-05-21 DIAGNOSIS — I700 Atherosclerosis of aorta: Secondary | ICD-10-CM | POA: Diagnosis not present

## 2023-05-21 DIAGNOSIS — Z79899 Other long term (current) drug therapy: Secondary | ICD-10-CM | POA: Diagnosis not present

## 2023-05-21 DIAGNOSIS — J449 Chronic obstructive pulmonary disease, unspecified: Secondary | ICD-10-CM

## 2023-05-21 DIAGNOSIS — R079 Chest pain, unspecified: Secondary | ICD-10-CM | POA: Diagnosis not present

## 2023-05-21 DIAGNOSIS — I482 Chronic atrial fibrillation, unspecified: Secondary | ICD-10-CM | POA: Diagnosis not present

## 2023-05-21 DIAGNOSIS — Z9989 Dependence on other enabling machines and devices: Secondary | ICD-10-CM | POA: Diagnosis not present

## 2023-05-21 HISTORY — PX: TEE WITHOUT CARDIOVERSION: SHX5443

## 2023-05-21 LAB — ECHO TEE

## 2023-05-21 SURGERY — ECHOCARDIOGRAM, TRANSESOPHAGEAL
Anesthesia: Monitor Anesthesia Care

## 2023-05-21 MED ORDER — LIDOCAINE 2% (20 MG/ML) 5 ML SYRINGE
INTRAMUSCULAR | Status: DC | PRN
  Administered 2023-05-21: 40 mg via INTRAVENOUS

## 2023-05-21 MED ORDER — PROPOFOL 10 MG/ML IV BOLUS
INTRAVENOUS | Status: DC | PRN
  Administered 2023-05-21: 30 mg via INTRAVENOUS
  Administered 2023-05-21: 20 mg via INTRAVENOUS

## 2023-05-21 MED ORDER — PROPOFOL 1000 MG/100ML IV EMUL
INTRAVENOUS | Status: AC
  Filled 2023-05-21: qty 200

## 2023-05-21 MED ORDER — SODIUM CHLORIDE 0.9 % IV SOLN: INTRAVENOUS | Status: DC

## 2023-05-21 MED ORDER — BUTAMBEN-TETRACAINE-BENZOCAINE 2-2-14 % EX AERO
2.0000 | INHALATION_SPRAY | Freq: Once | CUTANEOUS | Status: AC
  Administered 2023-05-21: 2 via TOPICAL

## 2023-05-21 MED ORDER — PROPOFOL 500 MG/50ML IV EMUL
INTRAVENOUS | Status: DC | PRN
  Administered 2023-05-21: 100 ug/kg/min via INTRAVENOUS

## 2023-05-21 MED ORDER — PHENYLEPHRINE HCL-NACL 20-0.9 MG/250ML-% IV SOLN
INTRAVENOUS | Status: AC
  Filled 2023-05-21: qty 250

## 2023-05-21 MED ORDER — BUTAMBEN-TETRACAINE-BENZOCAINE 2-2-14 % EX AERO
INHALATION_SPRAY | CUTANEOUS | Status: AC
  Filled 2023-05-21: qty 20

## 2023-05-21 MED ORDER — LIDOCAINE 2% (20 MG/ML) 5 ML SYRINGE
INTRAMUSCULAR | Status: AC
  Filled 2023-05-21: qty 25

## 2023-05-21 NOTE — CV Procedure (Signed)
    TRANSESOPHAGEAL ECHOCARDIOGRAM   NAME:  Maurice Wood   MRN: [id number] DOB:  [birth year]   ADMIT DATE: 05/21/2023  INDICATIONS:  Mitral regurgitation  PROCEDURE:   Informed consent was obtained prior to the procedure. The risks, benefits and alternatives for the procedure were discussed and the patient comprehended these risks.  Risks include, but are not limited to, cough, sore throat, vomiting, nausea, somnolence, esophageal and stomach trauma or perforation, bleeding, low blood pressure, aspiration, pneumonia, infection, trauma to the teeth and death.    After a procedural time-out, the patient was sedated by the anesthesia service. Once an adequate level of sedation was achieved, the transesophageal probe was inserted in the esophagus and stomach without difficulty and multiple views were obtained.    COMPLICATIONS:    There were no immediate complications.  FINDINGS:  LEFT VENTRICLE: EF = 60-65%. No regional wall motion abnormalities.  RIGHT VENTRICLE: Normal size and function.   LEFT ATRIUM: Severely dilated  LEFT ATRIAL APPENDAGE: No thrombus.   RIGHT ATRIUM: Severely dilated  AORTIC VALVE:  Trileaflet.  Mild calcification. Mild AI  MITRAL VALVE:    Calcified, thickened. Posterior leaflet restricted. 2 jets of mild (2+) MR  TRICUSPID VALVE: Normal. Mild TR  PULMONIC VALVE: Grossly normal. Trivial PR  INTERATRIAL SEPTUM: No PFO or ASD.  PERICARDIUM: No effusion  DESCENDING AORTA: Mild to moderate plaque   [name],MD 8:16 AM

## 2023-05-21 NOTE — Anesthesia Preprocedure Evaluation (Addendum)
Anesthesia Evaluation  Patient identified by MRN, date of birth, ID band Patient awake    Reviewed: Allergy & Precautions, NPO status , Patient's Chart, lab work & pertinent test results  History of Anesthesia Complications Negative for: history of anesthetic complications  Airway Mallampati: II  TM Distance: >3 FB Neck ROM: Full    Dental  (+) Caps, Dental Advisory Given   Pulmonary sleep apnea and Continuous Positive Airway Pressure Ventilation , COPD,  COPD inhaler, former smoker   breath sounds clear to auscultation       Cardiovascular hypertension, Pt. on medications (-) angina + dysrhythmias Atrial Fibrillation  Rhythm:Irregular Rate:Normal   syncopal event 2 weeks ago. Was walking in his garage and had fall with LOC  01/2023 ECHO: EF 60 to 65%. The LV has normal function, no regional wall motion abnormalities. There is moderate asymmetric LVH of the basal-septal segment. Left ventricular diastolic function could not be evaluated.   2. Right ventricular systolic function is normal. The right ventricular size is normal.   3. Left atrial size was severely dilated.   4. Right atrial size was severely dilated.   5. The mitral valve is degenerative. Mild MR. No evidence of mitral stenosis. Moderate mitral annular calcification.   6. The aortic valve is tricuspid. There is moderate calcification of the aortic valve. AI is mild. Mild AS. Aortic valve area, by VTI measures 2.22 cm, mean gradient measures 5.0 mmHg. Aortic valve Vmax measures 1.35 m/s.     Neuro/Psych Cerebral aneurysm    GI/Hepatic Neg liver ROS,GERD  Medicated and Controlled,,  Endo/Other  negative endocrine ROS    Renal/GU H/o stones     Musculoskeletal   Abdominal   Peds  Hematology eliquis   Anesthesia Other Findings   Reproductive/Obstetrics                             Anesthesia Physical Anesthesia Plan  ASA:  3  Anesthesia Plan: MAC   Post-op Pain Management: Minimal or no pain anticipated   Induction:   PONV Risk Score and Plan: 1 and Treatment may vary due to age or medical condition  Airway Management Planned: Natural Airway and Nasal Cannula  Additional Equipment: None  Intra-op Plan:   Post-operative Plan:   Informed Consent: I have reviewed the patients History and Physical, chart, labs and discussed the procedure including the risks, benefits and alternatives for the proposed anesthesia with the patient or authorized representative who has indicated his/her understanding and acceptance.     Dental advisory given  Plan Discussed with: CRNA and Surgeon  Anesthesia Plan Comments:         Anesthesia Quick Evaluation

## 2023-05-21 NOTE — Interval H&P Note (Signed)
History and Physical Interval Note:  05/21/2023 7:41 AM  Maurice Wood  has presented today for surgery, with the diagnosis of mitral regurgitation.  The various methods of treatment have been discussed with the patient and family. After consideration of risks, benefits and other options for treatment, the patient has consented to  Procedure(s): TRANSESOPHAGEAL ECHOCARDIOGRAM (N/A) as a surgical intervention.  The patient's history has been reviewed, patient examined, no change in status, stable for surgery.  I have reviewed the patient's chart and labs.  Questions were answered to the patient's satisfaction.     [name]

## 2023-05-21 NOTE — Anesthesia Postprocedure Evaluation (Signed)
Anesthesia Post Note  Patient: Maurice Wood  Procedure(s) Performed: TRANSESOPHAGEAL ECHOCARDIOGRAM     Patient location during evaluation: Cath Lab Anesthesia Type: MAC Level of consciousness: awake and alert, patient cooperative and oriented Pain management: pain level controlled Vital Signs Assessment: post-procedure vital signs reviewed and stable Respiratory status: nonlabored ventilation, respiratory function stable and spontaneous breathing Cardiovascular status: blood pressure returned to baseline and stable Postop Assessment: no apparent nausea or vomiting Anesthetic complications: no   No notable events documented.  Last Vitals:  Vitals:   05/21/23 0704 05/21/23 0815  BP:  100/64  Pulse: 87 70  Resp: 16 (!) 25  Temp:  36.9 C  SpO2: 92% 90%    Last Pain:  Vitals:   05/21/23 0815  TempSrc: Temporal  PainSc:                  Ciro Tashiro,E. Melayna Robarts

## 2023-05-21 NOTE — Transfer of Care (Signed)
Immediate Anesthesia Transfer of Care Note  Patient: Maurice Wood  Procedure(s) Performed: TRANSESOPHAGEAL ECHOCARDIOGRAM  Patient Location: PACU and Cath Lab  Anesthesia Type:MAC  Level of Consciousness: awake and sedated  Airway & Oxygen Therapy: Patient Spontanous Breathing and Patient connected to nasal cannula oxygen  Post-op Assessment: Report given to RN and Post -op Vital signs reviewed and stable  Post vital signs: Reviewed and stable  Last Vitals:  Vitals Value Taken Time  BP 100/64 05/21/23 0815  Temp 36.9 C 05/21/23 0815  Pulse 66 05/21/23 0818  Resp 21 05/21/23 0816  SpO2 91 % 05/21/23 0818  Vitals shown include unvalidated device data.  Last Pain:  Vitals:   05/21/23 0815  TempSrc: Temporal  PainSc:          Complications: No notable events documented.

## 2023-05-22 ENCOUNTER — Encounter (HOSPITAL_COMMUNITY): Payer: Self-pay | Admitting: Internal Medicine

## 2023-06-04 NOTE — Addendum Note (Signed)
Encounter addended by: Crissie Figures, RN on: 06/04/2023 2:49 PM  Actions taken: Imaging Exam ended

## 2023-06-05 ENCOUNTER — Other Ambulatory Visit: Payer: Self-pay | Admitting: Pulmonary Disease

## 2023-06-09 ENCOUNTER — Emergency Department (HOSPITAL_BASED_OUTPATIENT_CLINIC_OR_DEPARTMENT_OTHER)
Admission: EM | Admit: 2023-06-09 | Discharge: 2023-06-09 | Disposition: A | Discharge status: Home / Self Care | Payer: PPO | Attending: Emergency Medicine | Admitting: Emergency Medicine

## 2023-06-09 ENCOUNTER — Encounter (HOSPITAL_BASED_OUTPATIENT_CLINIC_OR_DEPARTMENT_OTHER): Payer: Self-pay | Admitting: Emergency Medicine

## 2023-06-09 ENCOUNTER — Other Ambulatory Visit: Payer: Self-pay

## 2023-06-09 ENCOUNTER — Emergency Department (HOSPITAL_BASED_OUTPATIENT_CLINIC_OR_DEPARTMENT_OTHER): Payer: PPO

## 2023-06-09 DIAGNOSIS — J9811 Atelectasis: Secondary | ICD-10-CM | POA: Diagnosis not present

## 2023-06-09 DIAGNOSIS — R079 Chest pain, unspecified: Secondary | ICD-10-CM | POA: Diagnosis present

## 2023-06-09 DIAGNOSIS — R072 Precordial pain: Secondary | ICD-10-CM | POA: Diagnosis not present

## 2023-06-09 DIAGNOSIS — Z7901 Long term (current) use of anticoagulants: Secondary | ICD-10-CM | POA: Diagnosis not present

## 2023-06-09 DIAGNOSIS — J449 Chronic obstructive pulmonary disease, unspecified: Secondary | ICD-10-CM | POA: Diagnosis not present

## 2023-06-09 DIAGNOSIS — I509 Heart failure, unspecified: Secondary | ICD-10-CM | POA: Diagnosis not present

## 2023-06-09 DIAGNOSIS — I4891 Unspecified atrial fibrillation: Secondary | ICD-10-CM | POA: Diagnosis not present

## 2023-06-09 LAB — CBC
HCT: 43.1 % (ref 39.0–52.0)
Hemoglobin: 13.6 g/dL (ref 13.0–17.0)
MCH: 28.5 pg (ref 26.0–34.0)
MCHC: 31.6 g/dL (ref 30.0–36.0)
MCV: 90.4 fL (ref 80.0–100.0)
Platelets: 175 10*3/uL (ref 150–400)
RBC: 4.77 MIL/uL (ref 4.22–5.81)
RDW: 14.6 % (ref 11.5–15.5)
WBC: 7 10*3/uL (ref 4.0–10.5)
nRBC: 0 % (ref 0.0–0.2)

## 2023-06-09 LAB — BASIC METABOLIC PANEL
Anion gap: 9 (ref 5–15)
BUN: 15 mg/dL (ref 8–23)
CO2: 23 mmol/L (ref 22–32)
Calcium: 9.3 mg/dL (ref 8.9–10.3)
Chloride: 108 mmol/L (ref 98–111)
Creatinine, Ser: 1.12 mg/dL (ref 0.61–1.24)
GFR, Estimated: >60 mL/min (ref >60)
Glucose, Bld: 185 mg/dL — ABNORMAL HIGH (ref 70–99)
Potassium: 4 mmol/L (ref 3.5–5.1)
Sodium: 140 mmol/L (ref 135–145)

## 2023-06-09 LAB — TROPONIN I (HIGH SENSITIVITY)
Troponin I (High Sensitivity): 9 ng/L (ref <18)
Troponin I (High Sensitivity): 8 ng/L (ref <18)

## 2023-06-09 NOTE — ED Provider Notes (Signed)
EMERGENCY DEPARTMENT AT [facility] Provider Note   CSN: [id number] Arrival date & time: 06/09/23  1228     History  Chief Complaint  Patient presents with   Chest Pain    Maurice Wood is a 89 y.o. male.  HPI    89 year old male comes in with chief complaint of chest pain.  Patient has history of mitral valve disorder and comes in with chief complaint of chest pain.  Patient an episode of chest pain yesterday.  Chest pain is described as tightness on the left side with intermittent radiation to the left shoulder.  He states the pain started at 9 PM.  He was able to sleep well at night.  He woke up without any pain.  After discharge, around 1130 or 12 he started having chest pain again.  Chest pain was more severe, still radiating to the left side.  Patient denies any associated nausea, vomiting, dizziness, shortness of breath, palpitations.  Patient has a cardiologist appointment coming up on Wednesday, but did not want to wait until then because of recurrence of the chest pain.  There is no specific aggravating or relieving factors.  Home Medications Prior to Admission medications   Medication Sig Start Date End Date Taking? Authorizing Provider  albuterol (VENTOLIN HFA) 108 (90 Base) MCG/ACT inhaler Inhale 2 puffs into the lungs every 6 (six) hours as needed for wheezing or shortness of breath. 07/31/22   Coralyn Helling, MD  apixaban (ELIQUIS) 5 MG TABS tablet Take 1 tablet (5 mg total) by mouth 2 (two) times daily. 01/25/23   [name], [name], MD  azelastine (ASTELIN) 0.1 % nasal spray Place 1 spray into both nostrils daily.    [provider]  cholecalciferol (VITAMIN D3) 25 MCG (1000 UNIT) tablet Take 1,000 Units by mouth daily.    [provider]  finasteride (PROSCAR) 5 MG tablet Take 5 mg by mouth daily. 04/09/20   [provider]  furosemide (LASIX) 40 MG tablet Take 1 tablet (40 mg total) by mouth daily as needed. 03/14/22    [name], [name], MD  omeprazole (PRILOSEC) 20 MG capsule Take 20 mg by mouth daily. 10/04/18   [provider]  potassium chloride SA (KLOR-CON M) 20 MEQ tablet Take 1 tablet (20 mEq total) by mouth daily as needed. 03/14/22   [name], [name], MD  TRELEGY ELLIPTA 100-62.5-25 MCG/ACT AEPB INHALE 1 PUFF INTO THE LUNGS DAILY IN THE AFTERNOON 06/05/23   Coralyn Helling, MD  UNABLE TO FIND C-Pap machine at bedtime    [provider]  vitamin B-12 (CYANOCOBALAMIN) 1000 MCG tablet Take 1,000 mcg by mouth every other day.     [provider]      Allergies    Epinephrine, Nsaids, Clarithromycin, Famotidine, Iodinated contrast media, and Nimodipine    Review of Systems   Review of Systems  All other systems reviewed and are negative.   Physical Exam Updated Vital Signs BP 110/62   Pulse (!) 52   Temp (!) 97.5 F (36.4 C) (Oral)   Resp 15   SpO2 95%  Physical Exam Vitals and nursing note reviewed.  Constitutional:      Appearance: He is well-developed.  HENT:     Head: Atraumatic.  Cardiovascular:     Rate and Rhythm: Normal rate.  Pulmonary:     Effort: Pulmonary effort is normal.  Musculoskeletal:     Cervical back: Neck supple.  Skin:    General: Skin is  warm.  Neurological:     Mental Status: He is alert and oriented to person, place, and time.     ED Results / Procedures / Treatments   Labs (all labs ordered are listed, but only abnormal results are displayed) Labs Reviewed  BASIC METABOLIC PANEL - Abnormal; Notable for the following components:      Result Value   Glucose, Bld 185 (*)    All other components within normal limits  CBC  TROPONIN I (HIGH SENSITIVITY)  TROPONIN I (HIGH SENSITIVITY)    EKG EKG Interpretation Date/Time:  Sunday June 09 2023 12:38:42 EDT Ventricular Rate:  80 PR Interval:    QRS Duration:  93 QT Interval:  401 QTC Calculation: 463 R Axis:   3  Text Interpretation: Atrial fibrillation Ventricular  premature complex Borderline low voltage, extremity leads RSR' in V1 or V2, probably normal variant No acute changes Confirmed by Derwood Kaplan [phone]) on 06/09/2023 1:03:02 PM  Radiology DG Chest Port 1 View  Result Date: 06/09/2023 CLINICAL DATA:  Sudden onset left chest and left shoulder pain last night and again today. History of AFib, CHF, and COPD. EXAM: PORTABLE CHEST 1 VIEW COMPARISON:  Chest radiograph 08/05/2022, 04/28/2021; CT chest 06/19/2021 FINDINGS: The heart size and mediastinal contours are within normal limits. Subsegmental left basilar atelectasis. Right lung is clear. Stable pleural thickening at the left costophrenic angle. No pleural effusion or pneumothorax. Multilevel degenerative changes in the mid to distal thoracic spine. Osteoarthritis of the left glenohumeral joint. IMPRESSION: Subsegmental left basilar atelectasis. Otherwise, no acute cardiopulmonary process. Electronically Signed   By: Sherron Ales M.D.   On: 06/09/2023 13:42    Procedures Procedures    Medications Ordered in ED Medications - No data to display  ED Course/ Medical Decision Making/ A&P             HEART Score: 3                Medical Decision Making Amount and/or Complexity of Data Reviewed Labs: ordered. Radiology: ordered.  This patient presents to the ED with chief complaint(s) of chest pain with pertinent past medical history of A-fib, valvular disorder.The complaint involves an extensive differential diagnosis and also carries with it a high risk of complications and morbidity.    The differential diagnosis includes : ACS syndrome Aortic dissection Pericardial effusion / tamponade Pneumonia Pleural effusion / Pulmonary edema PE Pneumothorax Musculoskeletal pain PUD / Gastritis / Esophagitis Esophageal spasm   The initial plan is to get basic labs, delta troponin.  Patient does not have any PE risk factors, pain is not pleuritic and the pain does not appear to be GI in nature  or musculoskeletal.  Additional history obtained: Additional history obtained from patient's spouse, was at the bedside Records reviewed  previous cardiology note, echocardiogram and cath that was done in 2021 which showed no obstruction.  Independent labs interpretation:  The following labs were independently interpreted: Troponins x 2 are normal and reassuring.  Independent visualization and interpretation of imaging: - I independently visualized the following imaging with scope of interpretation limited to determining acute life threatening conditions related to emergency care: X-ray of the chest, which revealed no evidence of pneumothorax  Treatment and Reassessment: Patient reassessed.  He remains chest pain-free.  Stable for discharge.  Hear score is 3.  Advise return precautions, and also advised him to bring up chest pain during the follow-up with the cardiologist later this week.   Final Clinical Impression(s) / ED  Diagnoses Final diagnoses:  Precordial chest pain    Rx / DC Orders ED Discharge Orders     None         Derwood Kaplan, MD 06/09/23 1555

## 2023-06-09 NOTE — Discharge Instructions (Signed)
We saw you in the ER for the chest pain/shortness of breath. All of our cardiac workup is normal, including labs, EKG and chest X-RAY are normal. We are not sure what is causing your discomfort, but we feel comfortable sending you home at this time. The workup in the ER is not complete, and you should follow up with your CARDIOLOGIST AS PLANNED.  Please return to the ER if you have worsening chest pain, shortness of breath, pain radiating to your jaw, shoulder, or back, sweats or fainting. Otherwise see the Cardiologist or your primary care doctor as requested.

## 2023-06-09 NOTE — ED Notes (Signed)
Dc instructions reviewed with patient. Patient voiced understanding. Dc with belongings.  °

## 2023-06-09 NOTE — ED Triage Notes (Signed)
Pt is current patient of Dr. Gala Romney. He has worn a heart monitor for a month and gets results on Wednesday. He states he prob has 2 leaky valves, had TEE on 6/11. Last night he had left chest/left shoulder pain ,sudden onset, resolved. Occurred again today ,has decreased but still there.

## 2023-06-10 DIAGNOSIS — G4733 Obstructive sleep apnea (adult) (pediatric): Secondary | ICD-10-CM | POA: Diagnosis not present

## 2023-06-11 DIAGNOSIS — H52203 Unspecified astigmatism, bilateral: Secondary | ICD-10-CM | POA: Diagnosis not present

## 2023-06-11 DIAGNOSIS — Z961 Presence of intraocular lens: Secondary | ICD-10-CM | POA: Diagnosis not present

## 2023-06-11 DIAGNOSIS — H35372 Puckering of macula, left eye: Secondary | ICD-10-CM | POA: Diagnosis not present

## 2023-06-11 DIAGNOSIS — H353111 Nonexudative age-related macular degeneration, right eye, early dry stage: Secondary | ICD-10-CM | POA: Diagnosis not present

## 2023-06-12 ENCOUNTER — Ambulatory Visit (HOSPITAL_COMMUNITY)
Admission: RE | Admit: 2023-06-12 | Discharge: 2023-06-12 | Disposition: A | Discharge status: Home / Self Care | Payer: PPO | Source: Ambulatory Visit | Attending: Family Medicine | Admitting: Family Medicine

## 2023-06-12 ENCOUNTER — Encounter (HOSPITAL_COMMUNITY): Payer: Self-pay

## 2023-06-12 VITALS — BP 124/78 | HR 67 | Wt 171.2 lb

## 2023-06-12 DIAGNOSIS — I351 Nonrheumatic aortic (valve) insufficiency: Secondary | ICD-10-CM | POA: Diagnosis not present

## 2023-06-12 DIAGNOSIS — R079 Chest pain, unspecified: Secondary | ICD-10-CM | POA: Diagnosis not present

## 2023-06-12 DIAGNOSIS — I110 Hypertensive heart disease with heart failure: Secondary | ICD-10-CM | POA: Insufficient documentation

## 2023-06-12 DIAGNOSIS — I482 Chronic atrial fibrillation, unspecified: Secondary | ICD-10-CM | POA: Diagnosis not present

## 2023-06-12 DIAGNOSIS — I77810 Thoracic aortic ectasia: Secondary | ICD-10-CM | POA: Diagnosis not present

## 2023-06-12 DIAGNOSIS — R0602 Shortness of breath: Secondary | ICD-10-CM | POA: Insufficient documentation

## 2023-06-12 DIAGNOSIS — R9389 Abnormal findings on diagnostic imaging of other specified body structures: Secondary | ICD-10-CM

## 2023-06-12 DIAGNOSIS — R55 Syncope and collapse: Secondary | ICD-10-CM | POA: Diagnosis not present

## 2023-06-12 DIAGNOSIS — Z79899 Other long term (current) drug therapy: Secondary | ICD-10-CM | POA: Diagnosis not present

## 2023-06-12 DIAGNOSIS — J449 Chronic obstructive pulmonary disease, unspecified: Secondary | ICD-10-CM | POA: Diagnosis not present

## 2023-06-12 DIAGNOSIS — I4821 Permanent atrial fibrillation: Secondary | ICD-10-CM | POA: Diagnosis not present

## 2023-06-12 DIAGNOSIS — Z8679 Personal history of other diseases of the circulatory system: Secondary | ICD-10-CM | POA: Diagnosis not present

## 2023-06-12 DIAGNOSIS — G4733 Obstructive sleep apnea (adult) (pediatric): Secondary | ICD-10-CM | POA: Diagnosis not present

## 2023-06-12 DIAGNOSIS — R42 Dizziness and giddiness: Secondary | ICD-10-CM | POA: Insufficient documentation

## 2023-06-12 DIAGNOSIS — R072 Precordial pain: Secondary | ICD-10-CM | POA: Diagnosis not present

## 2023-06-12 DIAGNOSIS — Z7901 Long term (current) use of anticoagulants: Secondary | ICD-10-CM | POA: Diagnosis not present

## 2023-06-12 DIAGNOSIS — I340 Nonrheumatic mitral (valve) insufficiency: Secondary | ICD-10-CM | POA: Diagnosis not present

## 2023-06-12 DIAGNOSIS — I5032 Chronic diastolic (congestive) heart failure: Secondary | ICD-10-CM | POA: Diagnosis not present

## 2023-06-12 LAB — IRON AND TIBC
Iron: 60 ug/dL (ref 45–182)
Saturation Ratios: 15 % — ABNORMAL LOW (ref 17.9–39.5)
TIBC: 414 ug/dL (ref 250–450)
UIBC: 354 ug/dL

## 2023-06-12 LAB — TSH: TSH: 2.584 u[IU]/mL (ref 0.350–4.500)

## 2023-06-12 LAB — MAGNESIUM: Magnesium: 2 mg/dL (ref 1.7–2.4)

## 2023-06-12 LAB — FERRITIN: Ferritin: 13 ng/mL — ABNORMAL LOW (ref 24–336)

## 2023-06-12 MED ORDER — OMEPRAZOLE 20 MG PO CPDR: 20.0000 mg | DELAYED_RELEASE_CAPSULE | Freq: Every day | ORAL | 6 refills | Status: DC

## 2023-06-12 MED ORDER — NITROGLYCERIN 0.4 MG SL SUBL: 0.4000 mg | SUBLINGUAL_TABLET | SUBLINGUAL | 0 refills | Status: AC | PRN

## 2023-06-12 NOTE — Progress Notes (Signed)
CARDIOLOGY CLINIC NOTE  Patient ID: Maurice Wood, male   DOB: 07-05-1933, 89 y.o.   MRN: [id number]  HPI: Maurice Wood is an 89 y.o. 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  Had episode of CP in January 2011 and underwent cath which showed normal coronaries with EF45% (? artificially low due to AF). Echo  2/11: 60-65%  Echo 7/16  EF 60% Mod MR.  Echo 2017 EF 60-65%.   Echo 11/18 EF 60-65% severe MAC. Moderate MR  He was seen in the ER in 6/21 for progressive SOB and cough. No CP. Hstrop normal. (7,7). BNP 287 (in setting of chronic AF)  ECG AF 105. CXR showed mass-like density in the right infrahilar region   CT chest 06/09/20 concerning for moderate COPD and  ILD. Referred to Pulmonary   TEE (7/21) to evaluate MR,  EF 60-65% Normal RV  MV thickened with bileaflet prolapse 2-3+ MR  Echo (09/27/21): EF 60-65% Norma RV MV thick moderate MR. Mild to moderate AI.   Echo (01/25/23): EF 60-65% Normal RV Mild AS/AI/MR  Follow up 2/24 with Dr. Gala Romney, doing well. Had a couple episodes of CP, mostly occurring after dinner and not exertional. Advised to increase PPI if needed but let office know if pain persisted.  Acute visit 6/24 for syncope. 2 week Zio placed and TEE arranged to evaluate known MR and AI.  TEE (6/24) showed EF 60-65%, RV normal, mild MR and mild MS, mild AI, Ao root dilation 31 mm  Zio 2 week (6/24) showed 100 % AF burden, 12 runs of NSVT (longest interval lasting 8 beats), rare PVCs  Today he returns for HF follow up with his wife and son. Seen in ED 06/09/23 with CP. HsTroponins neg x 2, ECG unremarkable. Today he is feeling ok. He continues to have dizzy spells. No further CP or syncope. He has SOB walking further distances on flat ground. Denies abnormal bleeding, palpitations, edema, or PND/Orthopnea. Appetite ok. No fever or chills. Weight at home 170 pounds. Taking all  medications. Wears CPAP religiously, father had Meneire's disease.  Cardiac Studies:  - TEE (6/24): EF 60-65%, RV normal, mild MR and mild MS, mild AI, Ao root dilation 31 mm  - Echo (2/24): EF 60-65% Normal RV Mild AS/AI/MR  - TEE (7/21): EF 60-65%, Normal RV,  MV thickened with bileaflet prolapse 2-3+ MR  - R/L cath 7/21   Ao = 129/73 (101) LV =  133/10 RA = 7 RV = 54/6 PA = 54/14 (30) PCW = 15 (V = 25) Fick cardiac output/index = 5.1/2.5 PVR = 3.0 Ao sat = 97% PA sat = 70%, 71%   Assessment: 1. Normal coronaries with left dominant system. RCA not injected but shown to be very small non-dominant vessel on previous study. LAD with small aneurysmal segment proximally but otherwise normal 2. EF 65% 3. Mild PAH with normal output. PCWP 15. V wave 22-25   - PFTs (12/21): FEV1 1.87 (70%), FVC 2.90 (76%) DLCO 60%  - Echo (9/20): EF 60% mild-mod MR mild AI Severe LAE    - Echo (11/18): EF 60-65% severe MAC. Moderate MR  - Echo (2017): EF 60-65%.   - Echo (7/16):  EF 60% Mod MR.  Review of systems complete and found to be negative unless listed in HPI.   Past Medical History:  Diagnosis Date   Allergy    rhinitis  Atrial fibrillation [facility]) Jan 2007   echo 1-07 normal ejection fraction, no significant valvular disease. adenosine cardiolite 1-07  no evidence of ischemia. A 48 hour Holter monitor in 7-07 showed good rate controlw/ chronic atrial fibrillation. Cath 1/11 normal cors. EF 45%. Echo 2-11 60-65%   Benign prostatic hypertrophy    Cerebral aneurysm    followed by Dr Corliss Skains   CHF (congestive heart failure) (HCC)    COPD (chronic obstructive pulmonary disease) (HCC)    Fatigue    History of chest pain    a. s/p LHC 05/2014 with normal cors   Hx of colonic polyps    diverticulosis   Hyperlipidemia    IBS (irritable bowel syndrome)    Incontinence    Per pt 12/14/11   Kidney stones    Obesity    Obstructive sleep apnea    noncompliant with CPAP   Retinal  vein occlusion    Current Outpatient Medications  Medication Sig Dispense Refill   albuterol (VENTOLIN HFA) 108 (90 Base) MCG/ACT inhaler Inhale 2 puffs into the lungs every 6 (six) hours as needed for wheezing or shortness of breath. 8 g 6   apixaban (ELIQUIS) 5 MG TABS tablet Take 1 tablet (5 mg total) by mouth 2 (two) times daily. 60 tablet 11   azelastine (ASTELIN) 0.1 % nasal spray Place 1 spray into both nostrils daily.     cholecalciferol (VITAMIN D3) 25 MCG (1000 UNIT) tablet Take 1,000 Units by mouth daily.     finasteride (PROSCAR) 5 MG tablet Take 5 mg by mouth daily.     furosemide (LASIX) 40 MG tablet Take 1 tablet (40 mg total) by mouth daily as needed. 30 tablet 11   omeprazole (PRILOSEC) 20 MG capsule Take 20 mg by mouth daily.     potassium chloride SA (KLOR-CON M) 20 MEQ tablet Take 1 tablet (20 mEq total) by mouth daily as needed. 30 tablet 11   TRELEGY ELLIPTA 100-62.5-25 MCG/ACT AEPB INHALE 1 PUFF INTO THE LUNGS DAILY IN THE AFTERNOON (Patient taking differently: Patient uses in the morning) 60 each 0   UNABLE TO FIND C-Pap machine at bedtime     vitamin B-12 (CYANOCOBALAMIN) 1000 MCG tablet Take 1,000 mcg by mouth every other day.      No current facility-administered medications for this encounter.   Lab Results  Component Value Date   CHOL 166 02/24/2014   HDL 53.80 02/24/2014   LDLCALC 84 02/24/2014   LDLDIRECT 97.4 11/09/2011   TRIG 143.0 02/24/2014   CHOLHDL 3 02/24/2014   BP 124/78   Pulse 67   Wt 77.7 kg (171 lb 3.2 oz)   SpO2 96%   BMI 24.56 kg/m   Wt Readings from Last 3 Encounters:  06/12/23 77.7 kg (171 lb 3.2 oz)  05/21/23 76.2 kg (168 lb)  05/15/23 76.5 kg (168 lb 9.6 oz)   Physical Exam General:  NAD. No resp difficulty, walked into clinic, elderly HEENT: Normal Neck: Supple. No JVD. Carotids 2+ bilat; no bruits. No lymphadenopathy or thryomegaly appreciated. Cor: PMI nondisplaced. Irregular rate & rhythm. No rubs, gallops, 2/6 MR Lungs:  Clear Abdomen: Soft, nontender, nondistended. No hepatosplenomegaly. No bruits or masses. Good bowel sounds. Extremities: No cyanosis, clubbing, rash, + pedal edema Neuro: Alert & oriented x 3, cranial nerves grossly intact. Moves all 4 extremities w/o difficulty. Affect pleasant.  ECG (personally reviewed): Atrial fibrillation 66 bpm  ASSESSMENT & PLAN: Syncope - No further events. - TEE showed mild MR and  AI - Zio showed no high grade arrhythmias - Discussed [place] DMV driving restrictions. - Arrange carotid dopplers - Labs today. - Discussed follow up with PCP +/- neurology for further work up.  2. Chronic diastolic HF - Echo 09/27/21 EF 60-65% l RV MV thick moderate MR. Mild to moderate AI.  - Echo 01/25/23: EF 60-65% Normal RV Mild AS/AI/MR - Stable NYHA II, limited mostly by lightheadedness - Volume status looks good.  - Continue Lasix PRN - Needs to wear compression hose - Check iron studies  3. A-fib  - Permanent. Rate controlled  - Continue Eliquis 5 mg bid. No indication for dose reduction currently - No bleeding issues, check iron studies  4. Moderate MR - Echo (11/18): Moderate MR, EF 60-65%, Severe MAC ? prolapse, but leaflets not seen well.  - TEE (7/21) with 2-3+ MR - Echo (10/22): EF 60-65% Normal RV, MV thick moderate MR, mild to moderate AI.  - Echo 01/25/23: EF 60-65% Normal RV, Mild AS/AI/MR - Mild by TEE 2/24  5. OSA - Compliant with CPAP  6. Abnormal chest CT - Felt to have COPD +/- ILD.  - Has seen Dr. Craige Cotta.   7. Aortic root dilation - Echo (01/25/23) Ao Root 4.4 cm (likely overestimated). - Stable.  - Follow with yearly echos.   8. Chest pain - Coronaries normal on previous cath - Symptoms appear atypical. ECG today without acute changes. Recent HsTroponins neg x 2 - Suspect GERD. - Increase prilosec to daily. - Will give SL nitroglycerin PRN  Follow up in 6 months with Dr. Gala Romney, as scheduled.  Maurice Malta Dayannara Pascal FNP-BC 12:12 PM

## 2023-06-12 NOTE — Patient Instructions (Addendum)
Thank you for coming in today  If you had labs drawn today, any labs that are abnormal the clinic will call you No news is good news  Medications: Take prilosec daily  As needed Nitroglycerin prescription  Follow up appointments:  You will be called and scheduled for Carotid ultrasound  Your physician recommends that you schedule a follow-up appointment in:  6 months With Dr. Gala Romney  You will receive a reminder letter in the mail a few months in advance. If you don't receive a letter, please call our office to schedule the follow-up appointment.    Do the following things EVERYDAY: Weigh yourself in the morning before breakfast. Write it down and keep it in a log. Take your medicines as prescribed Eat low salt foods--Limit salt (sodium) to 2000 mg per day.  Stay as active as you can everyday Limit all fluids for the day to less than 2 liters   At the [facility], you and your health needs are our priority. As part of our continuing mission to provide you with exceptional heart care, we have created designated Provider Care Teams. These Care Teams include your primary Cardiologist (physician) and Advanced Practice Providers (APPs- Physician Assistants and Nurse Practitioners) who all work together to provide you with the care you need, when you need it.   You may see any of the following providers on your designated Care Team at your next follow up: Dr Arvilla Meres Dr Marca Ancona Dr. Marcos Eke, NP Robbie Lis, [name] [facility] [place], [name] Brynda Peon, NP Karle Plumber, PharmD   Please be sure to bring in all your medications bottles to every appointment.    Thank you for choosing [facility]  If you have any questions or concerns before your next appointment please send [place] a message through [place] or call our office at [phone].    TO LEAVE A MESSAGE FOR THE NURSE  SELECT OPTION 2, PLEASE LEAVE A MESSAGE INCLUDING: YOUR NAME DATE OF BIRTH CALL BACK NUMBER REASON FOR CALL**this is important as we prioritize the call backs  YOU WILL RECEIVE A CALL BACK THE SAME DAY AS LONG AS YOU CALL BEFORE 4:00 PM

## 2023-06-21 ENCOUNTER — Ambulatory Visit (HOSPITAL_COMMUNITY)
Admission: RE | Admit: 2023-06-21 | Discharge: 2023-06-21 | Disposition: A | Discharge status: Home / Self Care | Payer: PPO | Source: Ambulatory Visit | Attending: Family Medicine | Admitting: Family Medicine

## 2023-06-21 ENCOUNTER — Telehealth (HOSPITAL_COMMUNITY): Payer: Self-pay

## 2023-06-21 DIAGNOSIS — R55 Syncope and collapse: Secondary | ICD-10-CM | POA: Diagnosis not present

## 2023-06-21 NOTE — Telephone Encounter (Signed)
Spoke with patient regarding the following results. Patient made aware and patient verbalized understanding.   

## 2023-06-21 NOTE — Telephone Encounter (Signed)
-----   Message from Arvilla Meres sent at 06/21/2023 11:17 AM EDT ----- Monitor ok

## 2023-07-04 ENCOUNTER — Ambulatory Visit (HOSPITAL_COMMUNITY)
Admission: RE | Admit: 2023-07-04 | Discharge: 2023-07-04 | Disposition: A | Discharge status: Home / Self Care | Payer: PPO | Source: Ambulatory Visit | Attending: Internal Medicine | Admitting: Internal Medicine

## 2023-07-04 DIAGNOSIS — D509 Iron deficiency anemia, unspecified: Secondary | ICD-10-CM | POA: Insufficient documentation

## 2023-07-04 MED ORDER — SODIUM CHLORIDE 0.9 % IV SOLN
510.0000 mg | Freq: Once | INTRAVENOUS | Status: AC
  Administered 2023-07-04: 510 mg via INTRAVENOUS
  Filled 2023-07-04: qty 510

## 2023-07-09 DIAGNOSIS — N200 Calculus of kidney: Secondary | ICD-10-CM | POA: Diagnosis not present

## 2023-07-09 DIAGNOSIS — K4000 Bilateral inguinal hernia, with obstruction, without gangrene, not specified as recurrent: Secondary | ICD-10-CM | POA: Diagnosis not present

## 2023-07-09 DIAGNOSIS — N3941 Urge incontinence: Secondary | ICD-10-CM | POA: Diagnosis not present

## 2023-07-09 DIAGNOSIS — R338 Other retention of urine: Secondary | ICD-10-CM | POA: Diagnosis not present

## 2023-07-09 DIAGNOSIS — N401 Enlarged prostate with lower urinary tract symptoms: Secondary | ICD-10-CM | POA: Diagnosis not present

## 2023-07-09 DIAGNOSIS — N390 Urinary tract infection, site not specified: Secondary | ICD-10-CM | POA: Diagnosis not present

## 2023-07-16 ENCOUNTER — Encounter (HOSPITAL_BASED_OUTPATIENT_CLINIC_OR_DEPARTMENT_OTHER): Payer: Self-pay | Admitting: Pulmonary Disease

## 2023-07-16 ENCOUNTER — Ambulatory Visit (HOSPITAL_BASED_OUTPATIENT_CLINIC_OR_DEPARTMENT_OTHER): Payer: PPO | Admitting: Pulmonary Disease

## 2023-07-16 VITALS — BP 110/64 | HR 71 | Ht 70.0 in | Wt 171.4 lb

## 2023-07-16 DIAGNOSIS — G4733 Obstructive sleep apnea (adult) (pediatric): Secondary | ICD-10-CM | POA: Diagnosis not present

## 2023-07-16 DIAGNOSIS — J432 Centrilobular emphysema: Secondary | ICD-10-CM

## 2023-07-16 MED ORDER — TRELEGY ELLIPTA 100-62.5-25 MCG/ACT IN AEPB: 1.0000 | INHALATION_SPRAY | Freq: Once | RESPIRATORY_TRACT | Status: AC

## 2023-07-16 NOTE — Progress Notes (Signed)
[facility]  Chief Complaint  Patient presents with   Follow-up    Follow up , wants to change Home Health care   , not happy with [facility].  Having some sob some though the day  he is using the tregily inhaler     Constitutional:  BP 110/64   Pulse 71   Ht 5\' 10"  (1.778 m)   Wt 171 lb 6.4 oz (77.7 kg)   SpO2 98%   BMI 24.59 kg/m   Past Medical History:  Nephrolithiasis, IBS, HLD, Colon polyps, Cerebral aneurysm, BPH, A fib, Cystoid macular degeneration, Idiopathic peripheral neuropathy  Past Surgical History:  He  has a past surgical history that includes Cholecystectomy; inguinal and umbilical herniorrhaphy; Tonsillectomy and adenoidectomy (as a child); Cardiac catheterization (1999); Hernia repair; left heart catheterization with coronary angiogram (N/A, 06/01/2014); Lithotripsy; Interstim Implant placement (04/2017); TEE without cardioversion (N/A, 07/05/2020); RIGHT/LEFT HEART CATH AND CORONARY ANGIOGRAPHY (N/A, 07/08/2020); and TEE without cardioversion (N/A, 05/21/2023).  Brief Summary:  Maurice Wood is a 89 y.o. male former smoker with obstructive sleep apnea, COPD, lung nodule and mild ILD.      Subjective:   He is here with his wife.  Uses CPAP nightly.  No issues with mask fit or pressure.  Breathing okay.  Trelegy helps.  Not having cough, wheeze, or sputum.  He is frustrated with [employer] billing department and wants to switch to a different DME.  He would prefer [facility].   Physical Exam:   Appearance - well kempt   ENMT - no sinus tenderness, no oral exudate, no LAN, Mallampati 3 airway, no stridor  Respiratory - equal breath sounds bilaterally, no wheezing or rales  CV - s1s2 regular rate and rhythm, 2/6  Ext - no clubbing, no edema  Skin - no rashes  Psych - normal mood and affect       Pulmonary testing:  PFT 08/08/20 >> FEV1 1.67 (61%), FEV1% 55, TLC 4.74 (67%), DLCO 50% PFT 12/06/20 >> FEV1  1.87 (70%), FEV1% 65, TLC 5.55 (78%), DLCO 47%  Chest Imaging:  CT chest 06/10/20 >> atherosclerosis, mod HH, 4 mm RML nodule, calcified granulomas, bronchial thickening, mild centrilobular and paraseptal emphysema, areas of GGO with mild septal thickening and thickening of peribronchovascular interstitium in lower lobes b/l HRCT chest 06/20/21 >> RML nodule 4 mm stable benign, elevated Lt diaphragm, no change in irregular peripheral interstitial opacity and septal thickening at the lung bases, including involvement of the non dependent portions of the right middle lobe and lingula  Sleep Tests:  PSG 11/09/17 >> AHI 40.8, SpO2 79% CPAP titration 03/25/18 >> CPAP 12 cm H2O >> AHI 0. HST 02/20/23 >> AHI 12.7, SpO2 low 73% CPAP 06/16/23 to 07/15/23 >> used on 30 of 30 nights with average 7 hrs 39 min.  Average AHI 0.6 with CPAP 12 cm H2O  Cardiac Tests:  Echo 09/27/21 >> EF 60 to 65%, severe LA/RA dilation, mod MR, mod AR, aortic root 45 mm  Social History:  He  reports that he quit smoking about 51 years ago. His smoking use included cigarettes. He started smoking about 71 years ago. He has a 20 pack-year smoking history. He has never used smokeless tobacco. He reports current alcohol use of about 1.0 standard drink of alcohol per week. He reports that he does not use drugs.  Family History:  His family history includes Alcohol abuse in his father; Aneurysm in his father; Cancer in  his father and maternal grandmother; Diabetes in his maternal uncle, paternal uncle, and son; Heart disease in his paternal grandfather; Sudden death in his mother.     Assessment/Plan:   COPD with emphysema. - advair wasn't effective on it's own - continue trelegy 100 one puff daily; sample provided - prn albuterol  Mild ILD. - stable on CT chest from July 2022 - repeat chest imaging if symptoms progress   Obstructive sleep apnea. - he is compliant with CPAP and reports benefit from therapy - he would like to  change from [facility] to [facility] for his DME - he should be eligible for a new machine in 2025 - continue CPAP 12 cm H2O  Permanent A fib, mild/mod MR. - followed by Dr. Nicholes Mango with [facility]   Chorioretinal inflammation of Rt eye. - off prednisone, imuran - previously treated with MTX - followed by Dr. Gae Bon with ophthalmology at [facility]   Idiopathic peripheral neuropathy. - followed by Dr. Everlena Cooper with [facility]  Time Spent Involved in Patient Care on Day of Examination:  27 minutes  Follow up:   Patient Instructions  Will see if you can change to [facility] for CPAP supplies  Follow up in 1 year  Medication List:   Allergies as of 07/16/2023       Reactions   Epinephrine Palpitations   Increased Heart Rate   Nsaids Other (See Comments)   NOT WHILE TAKING ELIQUIS   Clarithromycin Other (See Comments)   headache   Famotidine Nausea Only   Iodinated Contrast Media Other (See Comments)   Pt states "turning bright red"   Nimodipine    Flushing   Pneumococcal Vaccines         Medication List        Accurate as of July 16, 2023 12:16 PM. If you have any questions, ask your nurse or doctor.          STOP taking these medications    finasteride 5 MG tablet Commonly known as: PROSCAR Stopped by: Coralyn Helling       TAKE these medications    albuterol 108 (90 Base) MCG/ACT inhaler Commonly known as: VENTOLIN HFA Inhale 2 puffs into the lungs every 6 (six) hours as needed for wheezing or shortness of breath.   apixaban 5 MG Tabs tablet Commonly known as: Eliquis Take 1 tablet (5 mg total) by mouth 2 (two) times daily.   azelastine 0.1 % nasal spray Commonly known as: ASTELIN Place 1 spray into both nostrils daily.   cholecalciferol 25 MCG (1000 UNIT) tablet Commonly known as: VITAMIN D3 Take 1,000 Units by mouth daily.   cyanocobalamin 1000 MCG tablet Commonly known as: VITAMIN B12 Take 1,000 mcg by mouth every other day.    furosemide 40 MG tablet Commonly known as: LASIX Take 1 tablet (40 mg total) by mouth daily as needed.   nitroGLYCERIN 0.4 MG SL tablet Commonly known as: NITROSTAT Place 1 tablet (0.4 mg total) under the tongue every 5 (five) minutes as needed for chest pain.   omeprazole 20 MG capsule Commonly known as: PRILOSEC Take 1 capsule (20 mg total) by mouth daily.   potassium chloride SA 20 MEQ tablet Commonly known as: KLOR-CON M Take 1 tablet (20 mEq total) by mouth daily as needed.   Trelegy Ellipta 100-62.5-25 MCG/ACT Aepb Generic drug: Fluticasone-Umeclidin-Vilant INHALE 1 PUFF INTO THE LUNGS DAILY IN THE AFTERNOON What changed: See the new instructions.   Trelegy Ellipta 100-62.5-25 MCG/ACT Aepb  Generic drug: Fluticasone-Umeclidin-Vilant Inhale 1 puff into the lungs once for 1 dose. What changed: You were already taking a medication with the same name, and this prescription was added. Make sure you understand how and when to take each. Changed by: Coralyn Helling   UNABLE TO FIND C-Pap machine at bedtime        Signature:  Coralyn Helling, MD [facility] Pager - [phone] 07/16/2023, 12:16 PM

## 2023-07-16 NOTE — Patient Instructions (Signed)
Will see if you can change to [facility] for CPAP supplies  Follow up in 1 year

## 2023-07-29 DIAGNOSIS — D6869 Other thrombophilia: Secondary | ICD-10-CM | POA: Diagnosis not present

## 2023-07-29 DIAGNOSIS — J302 Other seasonal allergic rhinitis: Secondary | ICD-10-CM | POA: Diagnosis not present

## 2023-07-29 DIAGNOSIS — E538 Deficiency of other specified B group vitamins: Secondary | ICD-10-CM | POA: Diagnosis not present

## 2023-07-29 DIAGNOSIS — I700 Atherosclerosis of aorta: Secondary | ICD-10-CM | POA: Diagnosis not present

## 2023-07-29 DIAGNOSIS — J432 Centrilobular emphysema: Secondary | ICD-10-CM | POA: Diagnosis not present

## 2023-07-29 DIAGNOSIS — J849 Interstitial pulmonary disease, unspecified: Secondary | ICD-10-CM | POA: Diagnosis not present

## 2023-07-29 DIAGNOSIS — E559 Vitamin D deficiency, unspecified: Secondary | ICD-10-CM | POA: Diagnosis not present

## 2023-07-29 DIAGNOSIS — E1142 Type 2 diabetes mellitus with diabetic polyneuropathy: Secondary | ICD-10-CM | POA: Diagnosis not present

## 2023-07-29 DIAGNOSIS — M545 Low back pain, unspecified: Secondary | ICD-10-CM | POA: Diagnosis not present

## 2023-07-29 DIAGNOSIS — R609 Edema, unspecified: Secondary | ICD-10-CM | POA: Diagnosis not present

## 2023-07-29 DIAGNOSIS — N400 Enlarged prostate without lower urinary tract symptoms: Secondary | ICD-10-CM | POA: Diagnosis not present

## 2023-07-29 DIAGNOSIS — E1169 Type 2 diabetes mellitus with other specified complication: Secondary | ICD-10-CM | POA: Diagnosis not present

## 2023-07-29 DIAGNOSIS — I482 Chronic atrial fibrillation, unspecified: Secondary | ICD-10-CM | POA: Diagnosis not present

## 2023-08-05 ENCOUNTER — Ambulatory Visit: Payer: PPO | Admitting: Podiatry

## 2023-08-05 ENCOUNTER — Encounter: Payer: Self-pay | Admitting: Podiatry

## 2023-08-05 DIAGNOSIS — M79674 Pain in right toe(s): Secondary | ICD-10-CM

## 2023-08-05 DIAGNOSIS — M79675 Pain in left toe(s): Secondary | ICD-10-CM | POA: Diagnosis not present

## 2023-08-05 DIAGNOSIS — B351 Tinea unguium: Secondary | ICD-10-CM | POA: Diagnosis not present

## 2023-08-05 DIAGNOSIS — E1142 Type 2 diabetes mellitus with diabetic polyneuropathy: Secondary | ICD-10-CM | POA: Diagnosis not present

## 2023-08-05 NOTE — Progress Notes (Signed)
This patient returns to my office for at risk foot care.  This patient requires this care by a professional since this patient will be at risk due to having diabetic neuropathy, and coagulation disorder.  Patient is taking eliquiss.  This patient is unable to cut nails himself since the patient cannot reach his nails.These nails are painful walking and wearing shoes.  This patient presents for at risk foot care today.  General Appearance  Alert, conversant and in no acute stress.  Vascular  Dorsalis pedis and posterior tibial  pulses are  not palpable  bilaterally.  Capillary return is within normal limits  bilaterally. Temperature is within normal limits  bilaterally.  Neurologic  Senn-Weinstein monofilament wire test absent   bilaterally. Muscle power within normal limits bilaterally.  Nails Thick disfigured discolored nails with subungual debris  hallux nails  bilaterally. No evidence of bacterial infection or drainage bilaterally.  Orthopedic  No limitations of motion  feet .  No crepitus or effusions noted.  No bony pathology or digital deformities noted.  Skin  normotropic skin with no porokeratosis noted bilaterally.  No signs of infections or ulcers noted.     Onychomycosis  Pain in right toes  Pain in left toes  Consent was obtained for treatment procedures.   Mechanical debridement of nails 1-5  bilaterally performed with a nail nipper.  Filed with dremel without incident.    Return office visit    3 months                  Told patient to return for periodic foot care and evaluation due to potential at risk complications.   Gregory Mayer DPM  

## 2023-08-14 DIAGNOSIS — M7541 Impingement syndrome of right shoulder: Secondary | ICD-10-CM | POA: Diagnosis not present

## 2023-08-27 ENCOUNTER — Other Ambulatory Visit: Payer: Self-pay | Admitting: Pulmonary Disease

## 2023-09-16 ENCOUNTER — Telehealth: Payer: Self-pay | Admitting: Pulmonary Disease

## 2023-09-16 NOTE — Telephone Encounter (Signed)
Pt needs Albuterol refill called in. Out of refills.  Pharm is [facility] on [address].  Wife's number is [phone]

## 2023-09-17 MED ORDER — ALBUTEROL SULFATE HFA 108 (90 BASE) MCG/ACT IN AERS: 2.0000 | INHALATION_SPRAY | Freq: Four times a day (QID) | RESPIRATORY_TRACT | 6 refills | Status: AC | PRN

## 2023-09-17 NOTE — Telephone Encounter (Signed)
Albuterol Rx sen to preferred pharmacy.  Left detailed message for patient.  Nothing further needed.

## 2023-09-25 ENCOUNTER — Other Ambulatory Visit: Payer: Self-pay

## 2023-09-25 ENCOUNTER — Emergency Department (HOSPITAL_BASED_OUTPATIENT_CLINIC_OR_DEPARTMENT_OTHER)
Admission: EM | Admit: 2023-09-25 | Discharge: 2023-09-25 | Disposition: A | Discharge status: Home / Self Care | Payer: PPO

## 2023-09-25 ENCOUNTER — Other Ambulatory Visit (HOSPITAL_BASED_OUTPATIENT_CLINIC_OR_DEPARTMENT_OTHER): Payer: Self-pay

## 2023-09-25 ENCOUNTER — Emergency Department (HOSPITAL_BASED_OUTPATIENT_CLINIC_OR_DEPARTMENT_OTHER): Payer: PPO

## 2023-09-25 ENCOUNTER — Encounter (HOSPITAL_BASED_OUTPATIENT_CLINIC_OR_DEPARTMENT_OTHER): Payer: Self-pay

## 2023-09-25 DIAGNOSIS — S3993XA Unspecified injury of pelvis, initial encounter: Secondary | ICD-10-CM | POA: Diagnosis not present

## 2023-09-25 DIAGNOSIS — M25551 Pain in right hip: Secondary | ICD-10-CM | POA: Diagnosis not present

## 2023-09-25 DIAGNOSIS — S0990XA Unspecified injury of head, initial encounter: Secondary | ICD-10-CM | POA: Diagnosis not present

## 2023-09-25 DIAGNOSIS — M47816 Spondylosis without myelopathy or radiculopathy, lumbar region: Secondary | ICD-10-CM | POA: Diagnosis not present

## 2023-09-25 DIAGNOSIS — M5459 Other low back pain: Secondary | ICD-10-CM | POA: Diagnosis not present

## 2023-09-25 DIAGNOSIS — Z7901 Long term (current) use of anticoagulants: Secondary | ICD-10-CM | POA: Diagnosis not present

## 2023-09-25 DIAGNOSIS — M4807 Spinal stenosis, lumbosacral region: Secondary | ICD-10-CM | POA: Diagnosis not present

## 2023-09-25 DIAGNOSIS — M545 Low back pain, unspecified: Secondary | ICD-10-CM | POA: Diagnosis not present

## 2023-09-25 DIAGNOSIS — N201 Calculus of ureter: Secondary | ICD-10-CM | POA: Diagnosis not present

## 2023-09-25 DIAGNOSIS — M48061 Spinal stenosis, lumbar region without neurogenic claudication: Secondary | ICD-10-CM | POA: Diagnosis not present

## 2023-09-25 DIAGNOSIS — M549 Dorsalgia, unspecified: Secondary | ICD-10-CM | POA: Diagnosis not present

## 2023-09-25 DIAGNOSIS — W19XXXA Unspecified fall, initial encounter: Secondary | ICD-10-CM | POA: Diagnosis not present

## 2023-09-25 DIAGNOSIS — R519 Headache, unspecified: Secondary | ICD-10-CM | POA: Diagnosis not present

## 2023-09-25 MED ORDER — METHOCARBAMOL 500 MG PO TABS
500.0000 mg | ORAL_TABLET | Freq: Once | ORAL | Status: AC
  Administered 2023-09-25: 500 mg via ORAL
  Filled 2023-09-25: qty 1

## 2023-09-25 MED ORDER — LIDOCAINE 5 % EX PTCH
1.0000 | MEDICATED_PATCH | Freq: Once | CUTANEOUS | Status: DC
  Administered 2023-09-25: 1 via TRANSDERMAL
  Filled 2023-09-25: qty 1

## 2023-09-25 MED ORDER — ACETAMINOPHEN 500 MG PO TABS
1000.0000 mg | ORAL_TABLET | Freq: Once | ORAL | Status: AC
  Administered 2023-09-25: 1000 mg via ORAL
  Filled 2023-09-25: qty 2

## 2023-09-25 MED ORDER — GABAPENTIN 100 MG PO CAPS: 100.0000 mg | ORAL_CAPSULE | Freq: Three times a day (TID) | ORAL | 0 refills | Status: DC

## 2023-09-25 NOTE — Discharge Instructions (Signed)
Please follow-up with your primary doctor and neurosurgery.  Return immediately for fevers, chills, worsening pain, inability to walk secondary to weakness in your lower extremities, bowel or bladder incontinence, or numbness in your genital area.  May also return if develop any new or worsening symptoms that are concerning to you.

## 2023-09-25 NOTE — ED Provider Notes (Signed)
[place] EMERGENCY DEPARTMENT AT [facility] Provider Note   CSN: [id number] Arrival date & time: 09/25/23  0865     History  Chief Complaint  Patient presents with   Maurice Wood is a 89 y.o. male.  89 year old male present emergency department after mechanical fall 1 week ago.  Reports hitting his head no LOC.  Does take Eliquis.  Has not had any neurologic symptoms or headache since that time.  He is complaint today of continued pain to his right lower back and right hip.  No numbness tingling changes in sensation.  Movement makes pain worse.  No radiation.  No saddle anesthesia.  Is ambulating with his cane.   Fall       Home Medications Prior to Admission medications   Medication Sig Start Date End Date Taking? Authorizing Provider  gabapentin (NEURONTIN) 100 MG capsule Take 1 capsule (100 mg total) by mouth 3 (three) times daily for 14 days. 09/25/23 10/09/23 Yes Kaedyn Belardo, Harmon Dun, DO  albuterol (VENTOLIN HFA) 108 (90 Base) MCG/ACT inhaler Inhale 2 puffs into the lungs every 6 (six) hours as needed for wheezing or shortness of breath. 09/17/23   Cobb, Ruby Cola, NP  apixaban (ELIQUIS) 5 MG TABS tablet Take 1 tablet (5 mg total) by mouth 2 (two) times daily. 01/25/23   [name], [name], MD  azelastine (ASTELIN) 0.1 % nasal spray Place 1 spray into both nostrils daily.    [provider]  cholecalciferol (VITAMIN D3) 25 MCG (1000 UNIT) tablet Take 1,000 Units by mouth daily.    [provider]  Fluticasone-Umeclidin-Vilant (TRELEGY ELLIPTA) 100-62.5-25 MCG/ACT AEPB INHALE 1 PUFF INTO THE LUNGS DAILY IN THE AFTERNOON 08/28/23   Coralyn Helling, MD  furosemide (LASIX) 40 MG tablet Take 1 tablet (40 mg total) by mouth daily as needed. 03/14/22   [name], [name], MD  nitroGLYCERIN (NITROSTAT) 0.4 MG SL tablet Place 1 tablet (0.4 mg total) under the tongue every 5 (five) minutes as needed for chest pain. 06/12/23 09/10/23  Jacklynn Ganong, FNP   omeprazole (PRILOSEC) 20 MG capsule Take 1 capsule (20 mg total) by mouth daily. 06/12/23   Milford, Anderson Malta, FNP  potassium chloride SA (KLOR-CON M) 20 MEQ tablet Take 1 tablet (20 mEq total) by mouth daily as needed. 03/14/22   [name], [name], MD  UNABLE TO FIND C-Pap machine at bedtime    [provider]  vitamin B-12 (CYANOCOBALAMIN) 1000 MCG tablet Take 1,000 mcg by mouth every other day.     [provider]      Allergies    Epinephrine, Nsaids, Clarithromycin, Famotidine, Iodinated contrast media, Nimodipine, and Pneumococcal vaccines    Review of Systems   Review of Systems  Physical Exam Updated Vital Signs BP 128/68   Pulse 70   Temp 98 F (36.7 C) (Oral)   Resp 20   Ht 5\' 10"  (1.778 m)   Wt 79.4 kg   SpO2 94%   BMI 25.11 kg/m  Physical Exam Vitals and nursing note reviewed.  Constitutional:      General: He is not in acute distress.    Appearance: He is not toxic-appearing.  HENT:     Head: Normocephalic.     Nose: Nose normal.     Mouth/Throat:     Mouth: Mucous membranes are moist.  Eyes:     Conjunctiva/sclera: Conjunctivae normal.  Cardiovascular:     Rate and Rhythm: Normal rate and regular rhythm.  Pulmonary:     Effort: Pulmonary effort is normal.  Abdominal:     General: Abdomen is flat. There is no distension.     Tenderness: There is no abdominal tenderness. There is no guarding or rebound.  Musculoskeletal:     Comments: No bruising.  No midline spinal tenderness.  Does have some tenderness at the L5/sacral junction just lateral to midline.  Has 5 out of 5 plantarflexion and dorsiflexion bilaterally.  Normal sensation.  Neurological:     Mental Status: He is alert.  Psychiatric:        Mood and Affect: Mood normal.        Behavior: Behavior normal.     ED Results / Procedures / Treatments   Labs (all labs ordered are listed, but only abnormal results are displayed) Labs Reviewed - No data to  display  EKG None  Radiology CT PELVIS WO CONTRAST  Result Date: 09/25/2023 CLINICAL DATA:  Hip trauma, fracture suspected, no prior imaging. Right-sided hip and back pain. EXAM: CT PELVIS WITHOUT CONTRAST TECHNIQUE: Multidetector CT imaging of the pelvis was performed following the standard protocol without intravenous contrast. RADIATION DOSE REDUCTION: This exam was performed according to the departmental dose-optimization program which includes automated exposure control, adjustment of the mA and/or kV according to patient size and/or use of iterative reconstruction technique. COMPARISON:  None Available. FINDINGS: Urinary Tract: There is a nonobstructing 8 mm calculus within the left proximal ureter. No hydronephrosis. Bowel:  Unremarkable visualized pelvic bowel loops. Vascular/Lymphatic: The visualized abdominal aorta is normal in caliber with atherosclerotic calcifications. No enlarged lymph nodes identified in the field of view. Reproductive:  The prostate is heterogenous. Other: Left-greater-than-right fat containing inguinal hernias. Generator device within the left lower back subcutaneous tissues with lead extending through the right S3 neural foramen. Musculoskeletal: No acute osseous abnormality. Sacroiliac joints and pubic symphysis are anatomically aligned. The hip joints are anatomically aligned with superior joint space narrowing and marginal osteophytosis. IMPRESSION: 1. No acute osseous abnormality. 2. Nonobstructing 8 mm calculus in the left proximal ureter. No hydronephrosis. 3.  Aortic Atherosclerosis (ICD10-I70.0). Electronically Signed   By: Hart Robinsons M.D.   On: 09/25/2023 13:59   CT Lumbar Spine Wo Contrast  Result Date: 09/25/2023 CLINICAL DATA:  [name] a week ago.  Back pain. EXAM: CT LUMBAR SPINE WITHOUT CONTRAST TECHNIQUE: Multidetector CT imaging of the lumbar spine was performed without intravenous contrast administration. Multiplanar CT image reconstructions were  also generated. RADIATION DOSE REDUCTION: This exam was performed according to the departmental dose-optimization program which includes automated exposure control, adjustment of the mA and/or kV according to patient size and/or use of iterative reconstruction technique. COMPARISON:  Radiography 06/07/2022 FINDINGS: Segmentation: 5 lumbar type vertebral bodies. L5 has some transitional features. Alignment: Mild scoliotic curvature convex to the right. 2 mm degenerative anterolisthesis L4-5 and L5-S1. Vertebrae: Probably acute superior endplate fracture at L1 with loss of height of at most 20%. No other suspicion of acute regional fracture. Chronic endplate degenerative changes at L2-3 and L3-4. Paraspinal and other soft tissues: Hiatal hernia. Aortic atherosclerosis. Disc levels: No significant disc level finding from T10-11 through L1-2. L2-3: Chronic disc degeneration with endplate osteophytes, bulging of the disc and facet hypertrophy. Moderate stenosis at this level. L3-4: Chronic disc degeneration with endplate osteophytes and bulging of the disc. Mild facet and ligamentous hypertrophy. Stenosis of the lateral recesses at this level. L4-5: Chronic facet arthropathy with 2 mm of anterolisthesis. Bulging of the disc. Moderate multifactorial stenosis that  could cause neural compression. L5-S1: Chronic facet arthropathy with 2 mm of anterolisthesis. Mild bulging of the disc. No compressive canal stenosis. Foraminal narrowing on the right but without visible compression of the exiting L5 nerve. IMPRESSION: 1. Probably acute superior endplate fracture at L1 with loss of height of at most 20%. No other acute finding. 2. Chronic degenerative spondylosis and facet arthropathy throughout the lumbar spine. Moderate multifactorial stenosis at L2-3 and L4-5. Lateral recess stenosis at L3-4. Right foraminal stenosis at L5-S1. 3. Hiatal hernia. 4. Aortic atherosclerosis. Aortic Atherosclerosis (ICD10-I70.0). Electronically  Signed   By: Paulina Fusi M.D.   On: 09/25/2023 13:30   CT Head Wo Contrast  Result Date: 09/25/2023 CLINICAL DATA:  [name] about a week ago.  Headache. EXAM: CT HEAD WITHOUT CONTRAST TECHNIQUE: Contiguous axial images were obtained from the base of the skull through the vertex without intravenous contrast. RADIATION DOSE REDUCTION: This exam was performed according to the departmental dose-optimization program which includes automated exposure control, adjustment of the mA and/or kV according to patient size and/or use of iterative reconstruction technique. COMPARISON:  06/07/2002 FINDINGS: Brain: Age related atrophy without subjective lobar predominance. Chronic small-vessel ischemic changes of the white matter. Old small vessel cerebellar infarctions. No sign of acute infarction, mass lesion, hemorrhage, hydrocephalus or extra-axial collection. Vascular: There is atherosclerotic calcification of the major vessels at the base of the brain. Skull: Negative Sinuses/Orbits: Clear/normal Other: None IMPRESSION: No acute or traumatic finding. Age related atrophy. Chronic small-vessel ischemic changes of the white matter. Old small vessel cerebellar infarctions. Electronically Signed   By: Paulina Fusi M.D.   On: 09/25/2023 13:25    Procedures Procedures    Medications Ordered in ED Medications  lidocaine (LIDODERM) 5 % 1 patch (1 patch Transdermal Patch Applied 09/25/23 1129)  methocarbamol (ROBAXIN) tablet 500 mg (500 mg Oral Given 09/25/23 1125)  acetaminophen (TYLENOL) tablet 1,000 mg (1,000 mg Oral Given 09/25/23 1125)    ED Course/ Medical Decision Making/ A&P Clinical Course as of 09/25/23 1542  Wed Sep 25, 2023  1346 CT Head Wo Contrast IMPRESSION: No acute or traumatic finding. Age related atrophy. Chronic small-vessel ischemic changes of the white matter. Old small vessel cerebellar infarctions.   [TY]  1347 CT Lumbar Spine Wo Contrast IMPRESSION: 1. Probably acute superior endplate  fracture at L1 with loss of height of at most 20%. No other acute finding. 2. Chronic degenerative spondylosis and facet arthropathy throughout the lumbar spine. Moderate multifactorial stenosis at L2-3 and L4-5. Lateral recess stenosis at L3-4. Right foraminal stenosis at L5-S1. 3. Hiatal hernia. 4. Aortic atherosclerosis.  Aortic Atherosclerosis (ICD10-I70.0).   [TY]  1407 CT PELVIS WO CONTRAST MPRESSION: 1. No acute osseous abnormality. 2. Nonobstructing 8 mm calculus in the left proximal ureter. No hydronephrosis. 3.  Aortic Atherosclerosis (ICD10-I70.0).   [TY]  1430 CT scan being read as possible L1 superior endplate fracture.  Patient not complaining of pain to the area.  Has no midline tenderness to the lumbar spine.  Low suspicion that he has an acute fracture.  Also no findings on physical exam to suggest cauda equina/cord compression.  Discussed findings with patient and at abundance of caution will be referred to neurosurgery.  Discussed further pain control and return precautions.  Stable for discharge at this time. [TY]    Clinical Course User Index [TY] Coral Spikes, DO  Medical Decision Making This is a well-appearing 89 year old male present emergency department after mechanical fall roughly 1 week ago.  He is on Eliquis.  Will get CT head to evaluate for acute intracranial pathology although feel that this is less likely given patient having overt neurologic symptoms and has no localizing deficits on exam.  Given his age and trauma will evaluate for acute fracture to the lumbar spine and hip with CT scan.  Will forego labs at this time.  Supportive treatment with Tylenol, Robaxin and lidocaine patch.  Low suspicion for acute cord compression/cauda equina.  Amount and/or Complexity of Data Reviewed Independent Historian:     Details: Spouse notes that patient is ambulating okay, but with pain. External Data Reviewed:     Details: Per  chart review appears that he is on Eliquis 5 mg Labs:     Details: Forego labs given mechanical fall MSK type pain. Radiology: ordered. Decision-making details documented in ED Course.    Details: See ED course  Risk OTC drugs. Prescription drug management. Decision regarding hospitalization.          Final Clinical Impression(s) / ED Diagnoses Final diagnoses:  Acute right-sided low back pain without sciatica    Rx / DC Orders ED Discharge Orders          Ordered    gabapentin (NEURONTIN) 100 MG capsule  3 times daily        09/25/23 1432              Coral Spikes, DO 09/25/23 1542

## 2023-09-25 NOTE — ED Triage Notes (Signed)
States fell about a week ago.  States got "feet tangled up" and fell onto right side.  Now has right side back pain radiating into hip.  Ambulatory to room slowly.  On blood thinners.  States hit head but denies LOC

## 2023-10-15 ENCOUNTER — Telehealth (HOSPITAL_BASED_OUTPATIENT_CLINIC_OR_DEPARTMENT_OTHER): Payer: Self-pay | Admitting: Pulmonary Disease

## 2023-10-15 NOTE — Telephone Encounter (Signed)
Patient came in person asking for Trelegy samples due to refill being too expensive. Please advise and call patient when pickup is ready. Unsure if [facility] has any in stock due to not having a CMA here until 11/13.

## 2023-10-16 DIAGNOSIS — G4733 Obstructive sleep apnea (adult) (pediatric): Secondary | ICD-10-CM | POA: Diagnosis not present

## 2023-10-17 DIAGNOSIS — H903 Sensorineural hearing loss, bilateral: Secondary | ICD-10-CM | POA: Diagnosis not present

## 2023-10-22 DIAGNOSIS — I671 Cerebral aneurysm, nonruptured: Secondary | ICD-10-CM | POA: Diagnosis not present

## 2023-10-22 DIAGNOSIS — S32010A Wedge compression fracture of first lumbar vertebra, initial encounter for closed fracture: Secondary | ICD-10-CM | POA: Diagnosis not present

## 2023-10-24 NOTE — Telephone Encounter (Signed)
Called and spoke with pt and stated I would leave Trelegy samples at the front desk. He verbalized understanding nfn

## 2023-11-04 DIAGNOSIS — H903 Sensorineural hearing loss, bilateral: Secondary | ICD-10-CM | POA: Diagnosis not present

## 2023-11-05 ENCOUNTER — Ambulatory Visit: Payer: PPO | Admitting: Podiatry

## 2023-11-05 ENCOUNTER — Encounter: Payer: Self-pay | Admitting: Podiatry

## 2023-11-05 VITALS — Ht 70.0 in | Wt 175.0 lb

## 2023-11-05 DIAGNOSIS — E1142 Type 2 diabetes mellitus with diabetic polyneuropathy: Secondary | ICD-10-CM | POA: Diagnosis not present

## 2023-11-05 DIAGNOSIS — B351 Tinea unguium: Secondary | ICD-10-CM

## 2023-11-05 DIAGNOSIS — M79675 Pain in left toe(s): Secondary | ICD-10-CM

## 2023-11-05 DIAGNOSIS — M79674 Pain in right toe(s): Secondary | ICD-10-CM

## 2023-11-05 NOTE — Progress Notes (Signed)
This patient returns to my office for at risk foot care.  This patient requires this care by a professional since this patient will be at risk due to having diabetic neuropathy, and coagulation disorder.  Patient is taking eliquiss.  This patient is unable to cut nails himself since the patient cannot reach his nails.These nails are painful walking and wearing shoes.  This patient presents for at risk foot care today.  General Appearance  Alert, conversant and in no acute stress.  Vascular  Dorsalis pedis and posterior tibial  pulses are  not palpable  bilaterally.  Capillary return is within normal limits  bilaterally. Temperature is within normal limits  bilaterally.  Neurologic  Senn-Weinstein monofilament wire test absent   bilaterally. Muscle power within normal limits bilaterally.  Nails Thick disfigured discolored nails with subungual debris  hallux nails  bilaterally. No evidence of bacterial infection or drainage bilaterally.  Orthopedic  No limitations of motion  feet .  No crepitus or effusions noted.  No bony pathology or digital deformities noted.  Skin  normotropic skin with no porokeratosis noted bilaterally.  No signs of infections or ulcers noted.     Onychomycosis  Pain in right toes  Pain in left toes  Consent was obtained for treatment procedures.   Mechanical debridement of nails 1-5  bilaterally performed with a nail nipper.  Filed with dremel without incident.    Return office visit    3 months                  Told patient to return for periodic foot care and evaluation due to potential at risk complications.   Helane Gunther DPM

## 2023-11-21 DIAGNOSIS — S32010A Wedge compression fracture of first lumbar vertebra, initial encounter for closed fracture: Secondary | ICD-10-CM | POA: Diagnosis not present

## 2024-01-20 ENCOUNTER — Other Ambulatory Visit (HOSPITAL_COMMUNITY): Payer: Self-pay | Admitting: Internal Medicine

## 2024-02-05 ENCOUNTER — Ambulatory Visit: Payer: PPO | Admitting: Podiatry

## 2024-02-05 ENCOUNTER — Encounter: Payer: Self-pay | Admitting: Podiatry

## 2024-02-05 DIAGNOSIS — M79675 Pain in left toe(s): Secondary | ICD-10-CM

## 2024-02-05 DIAGNOSIS — M79674 Pain in right toe(s): Secondary | ICD-10-CM

## 2024-02-05 DIAGNOSIS — B351 Tinea unguium: Secondary | ICD-10-CM | POA: Diagnosis not present

## 2024-02-05 DIAGNOSIS — E1142 Type 2 diabetes mellitus with diabetic polyneuropathy: Secondary | ICD-10-CM

## 2024-02-05 NOTE — Progress Notes (Signed)
 This patient returns to my office for at risk foot care.  This patient requires this care by a professional since this patient will be at risk due to having diabetic neuropathy, and coagulation disorder.  Patient is taking eliquiss.  This patient is unable to cut nails himself since the patient cannot reach his nails.These nails are painful walking and wearing shoes.  This patient presents for at risk foot care today.  General Appearance  Alert, conversant and in no acute stress.  Vascular  Dorsalis pedis and posterior tibial  pulses are  not palpable  bilaterally.  Capillary return is within normal limits  bilaterally. Temperature is within normal limits  bilaterally.  Neurologic  Senn-Weinstein monofilament wire test absent   bilaterally. Muscle power within normal limits bilaterally.  Nails Thick disfigured discolored nails with subungual debris  hallux nails  bilaterally. No evidence of bacterial infection or drainage bilaterally.  Orthopedic  No limitations of motion  feet .  No crepitus or effusions noted.  No bony pathology or digital deformities noted.  Skin  normotropic skin with no porokeratosis noted bilaterally.  No signs of infections or ulcers noted.     Onychomycosis  Pain in right toes  Pain in left toes  Consent was obtained for treatment procedures.   Mechanical debridement of nails 1-5  bilaterally performed with a nail nipper.  Filed with dremel without incident.    Return office visit    3 months                  Told patient to return for periodic foot care and evaluation due to potential at risk complications.   Helane Gunther DPM

## 2024-02-27 DIAGNOSIS — J432 Centrilobular emphysema: Secondary | ICD-10-CM | POA: Diagnosis not present

## 2024-02-27 DIAGNOSIS — E538 Deficiency of other specified B group vitamins: Secondary | ICD-10-CM | POA: Diagnosis not present

## 2024-02-27 DIAGNOSIS — E559 Vitamin D deficiency, unspecified: Secondary | ICD-10-CM | POA: Diagnosis not present

## 2024-02-27 DIAGNOSIS — R6889 Other general symptoms and signs: Secondary | ICD-10-CM | POA: Diagnosis not present

## 2024-02-27 DIAGNOSIS — R519 Headache, unspecified: Secondary | ICD-10-CM | POA: Diagnosis not present

## 2024-02-27 DIAGNOSIS — I482 Chronic atrial fibrillation, unspecified: Secondary | ICD-10-CM | POA: Diagnosis not present

## 2024-02-27 DIAGNOSIS — Z0000 Encounter for general adult medical examination without abnormal findings: Secondary | ICD-10-CM | POA: Diagnosis not present

## 2024-02-27 DIAGNOSIS — M545 Low back pain, unspecified: Secondary | ICD-10-CM | POA: Diagnosis not present

## 2024-02-27 DIAGNOSIS — E1169 Type 2 diabetes mellitus with other specified complication: Secondary | ICD-10-CM | POA: Diagnosis not present

## 2024-02-27 DIAGNOSIS — N400 Enlarged prostate without lower urinary tract symptoms: Secondary | ICD-10-CM | POA: Diagnosis not present

## 2024-02-27 DIAGNOSIS — J302 Other seasonal allergic rhinitis: Secondary | ICD-10-CM | POA: Diagnosis not present

## 2024-02-27 DIAGNOSIS — Z1331 Encounter for screening for depression: Secondary | ICD-10-CM | POA: Diagnosis not present

## 2024-03-06 ENCOUNTER — Ambulatory Visit: Payer: Self-pay

## 2024-03-06 NOTE — Telephone Encounter (Addendum)
 Copied from CRM [id number]. Topic: Clinical - Red Word Triage >> Mar 06, 2024  3:46 PM Payton Doughty wrote: Red Word that prompted transfer to Nurse Triage: SOB  TRIAGE SUMMARY NOTE: Pt wife reporting pt having much more SOB than usual and occasional 5/10 chest pain in center of his chest that lasts longer than 5 min, mostly with exertion, also having intermittent dizziness and nausea for "quite a while." Pt wife confirms pt not struggling to breathe or speaking single words but needing to use his rescue inhaler more often lately. Advised pt be examined in hospital right away, call 911. Pt wife asking if pt can be seen with pulm instead. Nurse advised strongly that pt be seen in hospital, recommend calling 911 for symptoms then can follow up with pulm next week. Wife verbalized understanding.  E2C2 Pulmonary Triage - Initial Assessment Questions "Chief Complaint (e.g., cough, sob, wheezing, fever, chills, sweat or additional symptoms) *Go to specific symptom protocol after initial questions. More SOB than usual, at least moderate Don't think he's wheezing No fever Occasional chest pain Not speaking in single words, not struggling to breathe, may have to take breaths within sentences after exertion Dizziness, nausea No abdominal pain  "How long have symptoms been present?" Several weeks  "Have you used your inhalers/maintenance medication?" Yes If yes, "What medications?" Trelegy, rescue inhaler  If inhaler, ask "How many puffs and how often?" Note: Review instructions on medication in the chart. Once a day maybe not every day, been using more recently than ever  OXYGEN: "Do you wear supplemental oxygen?" No  Reason for Disposition  [1] Chest pain lasts > 5 minutes AND [2] age > 58  Answer Assessment - Initial Assessment Questions 1. LOCATION: "Where does it hurt?"       Dead center 2. RADIATION: "Does the pain go anywhere else?" (e.g., into neck, jaw, arms, back)     denies 3. ONSET:  "When did the chest pain begin?" (Minutes, hours or days)      Several weeks 4. PATTERN: "Does the pain come and go, or has it been constant since it started?"  "Does it get worse with exertion?"      Comes and goes, mostly with exertion 5. DURATION: "How long does it last" (e.g., seconds, minutes, hours)     Lasts more than 5 min 6. SEVERITY: "How bad is the pain?"  (e.g., Scale 1-10; mild, moderate, or severe)    - MILD (1-3): doesn't interfere with normal activities     - MODERATE (4-7): interferes with normal activities or awakens from sleep    - SEVERE (8-10): excruciating pain, unable to do any normal activities       5/10 7. CARDIAC RISK FACTORS: "Do you have any history of heart problems or risk factors for heart disease?" (e.g., angina, prior heart attack; diabetes, high blood pressure, high cholesterol, smoker, or strong family history of heart disease)     significant 8. PULMONARY RISK FACTORS: "Do you have any history of lung disease?"  (e.g., blood clots in lung, asthma, emphysema, birth control pills)     significant 10. OTHER SYMPTOMS: "Do you have any other symptoms?" (e.g., dizziness, nausea, vomiting, sweating, fever, difficulty breathing, cough)       Nausea and some dizziness, come and go, for quite a while don't even know how long  Protocols used: Chest Pain-A-AH

## 2024-03-06 NOTE — Telephone Encounter (Signed)
 Agree. Patient needs ED evaluation for exertional chest pain and shortness of breath.

## 2024-03-19 DIAGNOSIS — G4733 Obstructive sleep apnea (adult) (pediatric): Secondary | ICD-10-CM | POA: Diagnosis not present

## 2024-03-21 ENCOUNTER — Emergency Department (HOSPITAL_BASED_OUTPATIENT_CLINIC_OR_DEPARTMENT_OTHER)

## 2024-03-21 ENCOUNTER — Other Ambulatory Visit: Payer: Self-pay

## 2024-03-21 ENCOUNTER — Inpatient Hospital Stay (HOSPITAL_BASED_OUTPATIENT_CLINIC_OR_DEPARTMENT_OTHER)
Admission: EM | Admit: 2024-03-21 | Discharge: 2024-03-24 | DRG: 291 | Disposition: A | Discharge status: Home Health | Attending: Internal Medicine | Admitting: Internal Medicine

## 2024-03-21 DIAGNOSIS — K219 Gastro-esophageal reflux disease without esophagitis: Secondary | ICD-10-CM | POA: Diagnosis present

## 2024-03-21 DIAGNOSIS — G4733 Obstructive sleep apnea (adult) (pediatric): Secondary | ICD-10-CM | POA: Diagnosis not present

## 2024-03-21 DIAGNOSIS — I4821 Permanent atrial fibrillation: Secondary | ICD-10-CM | POA: Diagnosis present

## 2024-03-21 DIAGNOSIS — I5033 Acute on chronic diastolic (congestive) heart failure: Secondary | ICD-10-CM

## 2024-03-21 DIAGNOSIS — J849 Interstitial pulmonary disease, unspecified: Secondary | ICD-10-CM | POA: Diagnosis present

## 2024-03-21 DIAGNOSIS — R918 Other nonspecific abnormal finding of lung field: Secondary | ICD-10-CM | POA: Diagnosis not present

## 2024-03-21 DIAGNOSIS — N400 Enlarged prostate without lower urinary tract symptoms: Secondary | ICD-10-CM | POA: Diagnosis present

## 2024-03-21 DIAGNOSIS — R7989 Other specified abnormal findings of blood chemistry: Secondary | ICD-10-CM | POA: Diagnosis present

## 2024-03-21 DIAGNOSIS — R0989 Other specified symptoms and signs involving the circulatory and respiratory systems: Secondary | ICD-10-CM | POA: Diagnosis not present

## 2024-03-21 DIAGNOSIS — R9431 Abnormal electrocardiogram [ECG] [EKG]: Secondary | ICD-10-CM | POA: Diagnosis present

## 2024-03-21 DIAGNOSIS — J449 Chronic obstructive pulmonary disease, unspecified: Secondary | ICD-10-CM | POA: Diagnosis not present

## 2024-03-21 DIAGNOSIS — J9601 Acute respiratory failure with hypoxia: Secondary | ICD-10-CM | POA: Diagnosis not present

## 2024-03-21 DIAGNOSIS — I110 Hypertensive heart disease with heart failure: Secondary | ICD-10-CM | POA: Diagnosis not present

## 2024-03-21 DIAGNOSIS — Z91128 Patient's intentional underdosing of medication regimen for other reason: Secondary | ICD-10-CM

## 2024-03-21 DIAGNOSIS — I5031 Acute diastolic (congestive) heart failure: Secondary | ICD-10-CM | POA: Diagnosis present

## 2024-03-21 DIAGNOSIS — T501X6A Underdosing of loop [high-ceiling] diuretics, initial encounter: Secondary | ICD-10-CM | POA: Diagnosis present

## 2024-03-21 DIAGNOSIS — E785 Hyperlipidemia, unspecified: Secondary | ICD-10-CM | POA: Diagnosis present

## 2024-03-21 DIAGNOSIS — G8929 Other chronic pain: Secondary | ICD-10-CM | POA: Diagnosis not present

## 2024-03-21 DIAGNOSIS — Z79899 Other long term (current) drug therapy: Secondary | ICD-10-CM

## 2024-03-21 DIAGNOSIS — J90 Pleural effusion, not elsewhere classified: Secondary | ICD-10-CM | POA: Diagnosis not present

## 2024-03-21 DIAGNOSIS — Z1152 Encounter for screening for COVID-19: Secondary | ICD-10-CM

## 2024-03-21 DIAGNOSIS — Z8249 Family history of ischemic heart disease and other diseases of the circulatory system: Secondary | ICD-10-CM

## 2024-03-21 DIAGNOSIS — R0902 Hypoxemia: Secondary | ICD-10-CM | POA: Diagnosis not present

## 2024-03-21 DIAGNOSIS — Z811 Family history of alcohol abuse and dependence: Secondary | ICD-10-CM

## 2024-03-21 DIAGNOSIS — Z87891 Personal history of nicotine dependence: Secondary | ICD-10-CM

## 2024-03-21 DIAGNOSIS — Z7901 Long term (current) use of anticoagulants: Secondary | ICD-10-CM

## 2024-03-21 DIAGNOSIS — I509 Heart failure, unspecified: Secondary | ICD-10-CM | POA: Diagnosis not present

## 2024-03-21 DIAGNOSIS — J984 Other disorders of lung: Secondary | ICD-10-CM | POA: Diagnosis not present

## 2024-03-21 DIAGNOSIS — Z66 Do not resuscitate: Secondary | ICD-10-CM | POA: Diagnosis present

## 2024-03-21 DIAGNOSIS — Z833 Family history of diabetes mellitus: Secondary | ICD-10-CM

## 2024-03-21 LAB — COMPREHENSIVE METABOLIC PANEL WITH GFR
ALT: 14 U/L (ref 0–44)
AST: 22 U/L (ref 15–41)
Albumin: 3.8 g/dL (ref 3.5–5.0)
Alkaline Phosphatase: 62 U/L (ref 38–126)
Anion gap: 9 (ref 5–15)
BUN: 13 mg/dL (ref 8–23)
CO2: 22 mmol/L (ref 22–32)
Calcium: 9.1 mg/dL (ref 8.9–10.3)
Chloride: 108 mmol/L (ref 98–111)
Creatinine, Ser: 0.99 mg/dL (ref 0.61–1.24)
GFR, Estimated: >60 mL/min (ref >60)
Glucose, Bld: 194 mg/dL — ABNORMAL HIGH (ref 70–99)
Potassium: 3.8 mmol/L (ref 3.5–5.1)
Sodium: 139 mmol/L (ref 135–145)
Total Bilirubin: 2.3 mg/dL — ABNORMAL HIGH (ref 0.0–1.2)
Total Protein: 6.9 g/dL (ref 6.5–8.1)

## 2024-03-21 LAB — CBC
HCT: 46 % (ref 39.0–52.0)
Hemoglobin: 14.3 g/dL (ref 13.0–17.0)
MCH: 29.3 pg (ref 26.0–34.0)
MCHC: 31.1 g/dL (ref 30.0–36.0)
MCV: 94.3 fL (ref 80.0–100.0)
Platelets: 173 10*3/uL (ref 150–400)
RBC: 4.88 MIL/uL (ref 4.22–5.81)
RDW: 16 % — ABNORMAL HIGH (ref 11.5–15.5)
WBC: 8.2 10*3/uL (ref 4.0–10.5)
nRBC: 0 % (ref 0.0–0.2)

## 2024-03-21 LAB — TSH: TSH: 5.17 u[IU]/mL — ABNORMAL HIGH (ref 0.350–4.500)

## 2024-03-21 LAB — BASIC METABOLIC PANEL WITH GFR
Anion gap: 10 (ref 5–15)
BUN: 14 mg/dL (ref 8–23)
CO2: 22 mmol/L (ref 22–32)
Calcium: 9.6 mg/dL (ref 8.9–10.3)
Chloride: 110 mmol/L (ref 98–111)
Creatinine, Ser: 1.14 mg/dL (ref 0.61–1.24)
GFR, Estimated: >60 mL/min (ref >60)
Glucose, Bld: 165 mg/dL — ABNORMAL HIGH (ref 70–99)
Potassium: 3.5 mmol/L (ref 3.5–5.1)
Sodium: 142 mmol/L (ref 135–145)

## 2024-03-21 LAB — MAGNESIUM: Magnesium: 1.8 mg/dL (ref 1.7–2.4)

## 2024-03-21 LAB — BLOOD GAS, VENOUS
Acid-Base Excess: 2.4 mmol/L — ABNORMAL HIGH (ref 0.0–2.0)
Bicarbonate: 26.4 mmol/L (ref 20.0–28.0)
O2 Saturation: 20.6 %
Patient temperature: 37
pCO2, Ven: 38 mmHg — ABNORMAL LOW (ref 44–60)
pH, Ven: 7.45 — ABNORMAL HIGH (ref 7.25–7.43)
pO2, Ven: <31 mmHg — CL (ref 32–45)

## 2024-03-21 LAB — TROPONIN I (HIGH SENSITIVITY)
Troponin I (High Sensitivity): 17 ng/L (ref <18)
Troponin I (High Sensitivity): 20 ng/L — ABNORMAL HIGH (ref <18)
Troponin I (High Sensitivity): 19 ng/L — ABNORMAL HIGH (ref <18)

## 2024-03-21 LAB — RESP PANEL BY RT-PCR (RSV, FLU A&B, COVID)  RVPGX2
Influenza A by PCR: NEGATIVE
Influenza B by PCR: NEGATIVE
Resp Syncytial Virus by PCR: NEGATIVE
SARS Coronavirus 2 by RT PCR: NEGATIVE

## 2024-03-21 LAB — PHOSPHORUS: Phosphorus: 2.6 mg/dL (ref 2.5–4.6)

## 2024-03-21 LAB — BRAIN NATRIURETIC PEPTIDE: B Natriuretic Peptide: 431.9 pg/mL — ABNORMAL HIGH (ref 0.0–100.0)

## 2024-03-21 LAB — PROCALCITONIN: Procalcitonin: <0.1 ng/mL

## 2024-03-21 MED ORDER — SODIUM CHLORIDE 0.9% FLUSH: 3.0000 mL | INTRAVENOUS | Status: DC | PRN

## 2024-03-21 MED ORDER — ALBUTEROL SULFATE (2.5 MG/3ML) 0.083% IN NEBU
2.5000 mg | INHALATION_SOLUTION | Freq: Once | RESPIRATORY_TRACT | Status: AC
  Administered 2024-03-21: 2.5 mg via RESPIRATORY_TRACT
  Filled 2024-03-21: qty 3

## 2024-03-21 MED ORDER — INSULIN ASPART 100 UNIT/ML IJ SOLN: 0.0000 [IU] | Freq: Three times a day (TID) | INTRAMUSCULAR | Status: DC

## 2024-03-21 MED ORDER — ALBUTEROL SULFATE (2.5 MG/3ML) 0.083% IN NEBU: 2.5000 mg | INHALATION_SOLUTION | RESPIRATORY_TRACT | Status: DC | PRN

## 2024-03-21 MED ORDER — SODIUM CHLORIDE 0.9 % IV SOLN: 250.0000 mL | INTRAVENOUS | Status: AC | PRN

## 2024-03-21 MED ORDER — SODIUM CHLORIDE 0.9% FLUSH
3.0000 mL | Freq: Two times a day (BID) | INTRAVENOUS | Status: DC
  Administered 2024-03-22 – 2024-03-24 (×6): 3 mL via INTRAVENOUS

## 2024-03-21 MED ORDER — ACETAMINOPHEN 650 MG RE SUPP: 650.0000 mg | Freq: Four times a day (QID) | RECTAL | Status: DC | PRN

## 2024-03-21 MED ORDER — ACETAMINOPHEN 325 MG PO TABS
650.0000 mg | ORAL_TABLET | Freq: Four times a day (QID) | ORAL | Status: DC | PRN
  Administered 2024-03-22: 650 mg via ORAL
  Filled 2024-03-21: qty 2

## 2024-03-21 MED ORDER — IPRATROPIUM-ALBUTEROL 0.5-2.5 (3) MG/3ML IN SOLN
3.0000 mL | Freq: Once | RESPIRATORY_TRACT | Status: AC
  Administered 2024-03-21: 3 mL via RESPIRATORY_TRACT
  Filled 2024-03-21: qty 3

## 2024-03-21 MED ORDER — FUROSEMIDE 10 MG/ML IJ SOLN
40.0000 mg | Freq: Once | INTRAMUSCULAR | Status: AC
  Administered 2024-03-21: 40 mg via INTRAVENOUS
  Filled 2024-03-21: qty 4

## 2024-03-21 MED ORDER — PANTOPRAZOLE SODIUM 40 MG PO TBEC
40.0000 mg | DELAYED_RELEASE_TABLET | Freq: Every day | ORAL | Status: DC
  Administered 2024-03-22 – 2024-03-24 (×3): 40 mg via ORAL
  Filled 2024-03-21 (×3): qty 1

## 2024-03-21 MED ORDER — APIXABAN 5 MG PO TABS
5.0000 mg | ORAL_TABLET | Freq: Two times a day (BID) | ORAL | Status: DC
  Administered 2024-03-22 – 2024-03-24 (×6): 5 mg via ORAL
  Filled 2024-03-21 (×6): qty 1

## 2024-03-21 MED ORDER — HYDROCODONE-ACETAMINOPHEN 5-325 MG PO TABS: 1.0000 | ORAL_TABLET | ORAL | Status: DC | PRN

## 2024-03-21 MED ORDER — FUROSEMIDE 10 MG/ML IJ SOLN
40.0000 mg | Freq: Every day | INTRAMUSCULAR | Status: DC
  Administered 2024-03-22 – 2024-03-23 (×2): 40 mg via INTRAVENOUS
  Filled 2024-03-21 (×2): qty 4

## 2024-03-21 MED ORDER — BUDESON-GLYCOPYRROL-FORMOTEROL 160-9-4.8 MCG/ACT IN AERO
2.0000 | INHALATION_SPRAY | Freq: Two times a day (BID) | RESPIRATORY_TRACT | Status: DC
  Administered 2024-03-22 – 2024-03-23 (×6): 2 via RESPIRATORY_TRACT
  Filled 2024-03-21: qty 5.9

## 2024-03-21 NOTE — ED Provider Notes (Signed)
  EMERGENCY DEPARTMENT AT [facility] Provider Note   CSN: [id number] Arrival date & time: 03/21/24  1709     History {Add pertinent medical, surgical, social history, OB history to HPI:1} Chief Complaint  Patient presents with   Chest Pain    Maurice Wood is a 89 y.o. male.  89 year old male with past medical history of CHF and atrial fibrillation on Eliquis presenting to the emergency department today with shortness of breath and chest pressure.  The patient states this been going now for the past few days.  He is acutely worse this afternoon.  The patient's family checked his oxygen at the house and it was in the 20s.  He was brought to the ER subsequently for further evaluation.  The patient states he has been having chest pressure on the left side now for the past few days.  He has been having significant lower extremity swelling.  He supposed be on Lasix but has not been taking this because he was concerned with the amount that he was urinating.  He came to the ER today for further evaluation due to these ongoing symptoms.  He denies any fevers, chills or hemoptysis.   Chest Pain Associated symptoms: shortness of breath        Home Medications Prior to Admission medications   Medication Sig Start Date End Date Taking? Authorizing Provider  albuterol (VENTOLIN HFA) 108 (90 Base) MCG/ACT inhaler Inhale 2 puffs into the lungs every 6 (six) hours as needed for wheezing or shortness of breath. 09/17/23   Cobb, Mariah Shines, NP  apixaban (ELIQUIS) 5 MG TABS tablet TAKE 1 TABLET BY MOUTH TWICE A DAY 01/22/24   [name], Daniel R, MD  azelastine (ASTELIN) 0.1 % nasal spray Place 1 spray into both nostrils daily.    [provider]  cholecalciferol (VITAMIN D3) 25 MCG (1000 UNIT) tablet Take 1,000 Units by mouth daily.    [provider]  Fluticasone-Umeclidin-Vilant (TRELEGY ELLIPTA) 100-62.5-25 MCG/ACT AEPB INHALE 1 PUFF INTO THE LUNGS DAILY IN THE  AFTERNOON 08/28/23   Sood, Vineet, MD  furosemide (LASIX) 40 MG tablet Take 1 tablet (40 mg total) by mouth daily as needed. 03/14/22   [name], Daniel R, MD  gabapentin (NEURONTIN) 100 MG capsule Take 1 capsule (100 mg total) by mouth 3 (three) times daily for 14 days. 09/25/23 10/09/23  Rolinda Climes, DO  nitroGLYCERIN (NITROSTAT) 0.4 MG SL tablet Place 1 tablet (0.4 mg total) under the tongue every 5 (five) minutes as needed for chest pain. 06/12/23 09/10/23  Elmarie Hacking, FNP  omeprazole (PRILOSEC) 20 MG capsule Take 1 capsule (20 mg total) by mouth daily. 06/12/23   Milford, Arlice Bene, FNP  potassium chloride SA (KLOR-CON M) 20 MEQ tablet Take 1 tablet (20 mEq total) by mouth daily as needed. 03/14/22   [name], Rheta Celestine, MD  UNABLE TO FIND C-Pap machine at bedtime    [provider]  vitamin B-12 (CYANOCOBALAMIN) 1000 MCG tablet Take 1,000 mcg by mouth every other day.     [provider]      Allergies    Epinephrine, Nsaids, Clarithromycin, Famotidine, Iodinated contrast media, Nimodipine, and Pneumococcal vaccines    Review of Systems   Review of Systems  Respiratory:  Positive for shortness of breath.   Cardiovascular:  Positive for chest pain and leg swelling.  All other systems reviewed and are negative.   Physical Exam Updated Vital Signs BP (!) 143/82   Pulse 95  Temp 97.9 F (36.6 C) (Oral)   Resp (!) 22   SpO2 94%  Physical Exam Vitals and nursing note reviewed.   Gen: NAD Eyes: PERRL, EOMI HEENT: no oropharyngeal swelling Neck: trachea midline Resp: Diminished at bilateral lung bases Card: RRR, no murmurs, rubs, or gallops Abd: nontender, nondistended Extremities: no calf tenderness, 2+ pitting edema bilaterally Vascular: 2+ radial pulses bilaterally, 2+ DP pulses bilaterally Skin: no rashes Psyc: acting appropriately   ED Results / Procedures / Treatments   Labs (all labs ordered are listed, but only abnormal results are  displayed) Labs Reviewed  BASIC METABOLIC PANEL WITH GFR - Abnormal; Notable for the following components:      Result Value   Glucose, Bld 165 (*)    All other components within normal limits  CBC - Abnormal; Notable for the following components:   RDW 16.0 (*)    All other components within normal limits  BRAIN NATRIURETIC PEPTIDE - Abnormal; Notable for the following components:   B Natriuretic Peptide 431.9 (*)    All other components within normal limits  RESP PANEL BY RT-PCR (RSV, FLU A&B, COVID)  RVPGX2  TROPONIN I (HIGH SENSITIVITY)    EKG None  Radiology No results found.  Procedures Procedures  {Document cardiac monitor, telemetry assessment procedure when appropriate:1}  Medications Ordered in ED Medications  albuterol (PROVENTIL) (2.5 MG/3ML) 0.083% nebulizer solution 2.5 mg (2.5 mg Nebulization Given 03/21/24 1751)  ipratropium-albuterol (DUONEB) 0.5-2.5 (3) MG/3ML nebulizer solution 3 mL (3 mLs Nebulization Given 03/21/24 1751)    ED Course/ Medical Decision Making/ A&P   {   Click here for ABCD2, HEART and other calculatorsREFRESH Note before signing :1}                              Medical Decision Making 89 year old male with past medical history of CHF and atrial fibrillation on Eliquis presenting to the emergency department today with chest pressure and shortness of breath.  The patient was hypoxic in the 70s on arrival here.  Is placed on nasal cannula oxygen with improvement.  I will give the patient a DuoNeb here as he does have a reported history of COPD although he does not have any significant wheezes here.  He is slightly diminished.  I will obtain basic labs as well as a BNP and troponin to evaluate for atypical ACS and CHF.  I strongly suspect this is due to CHF exacerbation given the patient's nonadherence to his medication regimen with Lasix and the significant swelling lower extremities.  Suspicion for pulmonary embolism is low at this time given  this history.  Amount and/or Complexity of Data Reviewed Labs: ordered. Radiology: ordered.  Risk Prescription drug management.   ***  {Document critical care time when appropriate:1} {Document review of labs and clinical decision tools ie heart score, Chads2Vasc2 etc:1}  {Document your independent review of radiology images, and any outside records:1} {Document your discussion with family members, caretakers, and with consultants:1} {Document social determinants of health affecting pt's care:1} {Document your decision making why or why not admission, treatments were needed:1} Final Clinical Impression(s) / ED Diagnoses Final diagnoses:  None    Rx / DC Orders ED Discharge Orders     None

## 2024-03-21 NOTE — Assessment & Plan Note (Signed)
 Continue home medications

## 2024-03-21 NOTE — Subjective & Objective (Signed)
 Came in with CP and SOB for the past 1 wk has some underlining similar complaints at baseline Family noted that oxygen has been drifting down over the past week noted O2 sat 88% at home and even as low as 82 no cough no fever no chills  Patient has underlying CHF and atrial fibrillation for which she takes Eliquis Patient was brought in to freestanding ER at [facility] Reported chest pain on left side for the past few days He has not been taking his Lasix as prescribed because he was concerned it was making him urinate too much

## 2024-03-21 NOTE — Assessment & Plan Note (Signed)
-  will monitor on tele avoid QT prolonging medications, rehydrate correct electrolytes ? ?

## 2024-03-21 NOTE — Progress Notes (Signed)
 Per Dr Hendrick Locke request, pharmacy has been notified to have a pharmacy tech to review patients home meds.

## 2024-03-21 NOTE — Assessment & Plan Note (Signed)
 Not on statin

## 2024-03-21 NOTE — Assessment & Plan Note (Signed)
 Continue to cycle cardiac enzymes Recent cardiac cath resection showing no evidence of CAD somewhat reassuring Obtain echogram Noted slight troponin leak we will continue to cycle to see if there is any increase Slight troponin elevation could be in the setting of CHF Hold off on heparin as patient is already on Eliquis  If there is any evidence of significant troponin increase would need cardiology consult and potential transition to heparin

## 2024-03-21 NOTE — Assessment & Plan Note (Signed)
 Although is a chronic condition currently unlikely to be the main driving factor for hypoxia Given no wheezing and evidence of CHF on chest x-ray Continue home medications

## 2024-03-21 NOTE — Assessment & Plan Note (Signed)
Diet controlled order sliding scale ?

## 2024-03-21 NOTE — ED Notes (Signed)
 [facility] at bedside

## 2024-03-21 NOTE — Assessment & Plan Note (Signed)
 Order CPAP.

## 2024-03-21 NOTE — H&P (Signed)
 Maurice Wood WJX:[id number] DOB: Sep 26, 1933 DOA: 03/21/2024     PCP: Rae Bugler, MD   Outpatient Specialists:  CARDS: [name],    Pulmonary Dr.Sood    Patient arrived to ER on 03/21/24 at 1709 Referred by Attending Selene Dais, MD   Patient coming from:    home Lives  With family    Chief Complaint:   Chief Complaint  Patient presents with   Chest Pain    HPI: Maurice Wood is a 89 y.o. male with medical history significant of atrial fibrillation on Eliquis,  OSA on CPAP, HTN, small intracranial aneurysms with spontaneous dissection of the right internal carotid artery in 2002, COPD, ILD, HLD, GERD, BPH,     Presented with  CP /SOB Came in with CP and SOB for the past 1 wk has some underlining similar complaints at baseline Family noted that oxygen has been drifting down over the past week noted O2 sat 88% at home and even as low as 82 no cough no fever no chills  Patient has underlying CHF and atrial fibrillation for which she takes Eliquis Patient was [facility] Reported chest pain on left side for the past few days He has not been taking his Lasix as prescribed because he was concerned it was making him urinate too much   Pt has been followed by Cardiology for Cp who feels it is Likely GERD  Reported CP was a very busy weak he has done some traveling Visited [place] 2h drive  CP has been coming and going  Worse with exertion Non pleuritic  Denies significant ETOH intake  Does not smoke  Lab Results  Component Value Date   SARSCOV2NAA NEGATIVE 03/21/2024   SARSCOV2NAA NEGATIVE 04/28/2021   SARSCOV2NAA NEGATIVE 07/04/2020   SARSCOV2NAA NEGATIVE 05/26/2020        Regarding pertinent Chronic problems:    Hyperlipidemia - not  on statins   Lipid Panel     Component Value Date/Time   CHOL 166 02/24/2014 0833   TRIG 143.0 02/24/2014 0833   HDL 53.80 02/24/2014 0833   CHOLHDL 3 02/24/2014 0833   VLDL 28.6  02/24/2014 0833   LDLCALC 84 02/24/2014 0833   LDLDIRECT 97.4 11/09/2011 0803       chronic diastolic CHF valvular disease - on LAsix last echo  Recent Results (from the past 21308 hours)  ECHO TEE   Collection Time: 05/21/23  8:21 AM  Result Value   Est EF 60 - 65%   Narrative      TRANSESOPHOGEAL ECHO REPORT        IMPRESSIONS    1. Left ventricular ejection fraction, by estimation, is 60 to 65%. The left ventricle has normal function. The left ventricle has no regional wall motion abnormalities.  2. Right ventricular systolic function is normal. The right ventricular size is normal.  3. Left atrial size was severely dilated. No left atrial/left atrial appendage thrombus was detected.  4. Right atrial size was severely dilated.  5. The mitral valve is calcified and thickened. The posterior leaflet is restricted. There are 2 jets of mild MR.. The mitral valve is abnormal. Mild mitral valve regurgitation. Mild mitral stenosis. The mean mitral valve gradient is 3.0 mmHg.  6. The aortic valve is tricuspid. There is mild calcification of the aortic valve. Aortic valve regurgitation is mild. Aortic valve sclerosis/calcification is present, without any evidence of aortic stenosis.  7. Aortic dilatation noted. There is mild dilatation  of the aortic root, measuring 41 mm. There is mild (Grade II) layered plaque involving the descending aorta.          -January 2011 and underwent cath which showed normal coronaries     Repeat r/L cath 7/21   Ao = 129/73 (101) LV =  133/10 RA = 7 RV = 54/6 PA = 54/14 (30) PCW = 15 (V = 25) Fick cardiac output/index = 5.1/2.5 PVR = 3.0 Ao sat = 97% PA sat = 70%, 71%   Assessment: 1. Normal coronaries with left dominant system. RCA not injected but shown to be very small non-dominant vessel on previous study. LAD with small aneurysmal segment proximally but otherwise normal 2. EF 65% 3. Mild PAH with normal output. PCWP 15. V wave 22-25                      COPD -  followed by pulmonology   not  on baseline oxygen  *L,      OSA -on nocturnal  CPAP,      A. Fib -   atrial fibrillation CHA2DS2 vas score   5   current  on anticoagulation with Eliquis,    BPH - on   Proscar   While in ER:  Was hypoxic down to 70% on arrival started on oxygen  BNP 431 lower extremity swelling Given DuoNeb initially thinking may be COPD exacerbation Chest x-ray showed cardiomegaly with pulmonary vascular congestion edema versus infiltrate with bilateral pleural effusions more consistent with CHF   Given Lasix and has improved the SHORTNESS Of breath  Lab Orders         Resp panel by RT-PCR (RSV, Flu A&B, Covid) Anterior Nasal Swab         Basic metabolic panel         CBC         Brain natriuretic peptide     CXR -likely CHF cardiomegaly bilateral pleural effusions interstitial prominence  Following Medications were ordered in ER: Medications  albuterol (PROVENTIL) (2.5 MG/3ML) 0.083% nebulizer solution 2.5 mg (2.5 mg Nebulization Given 03/21/24 1751)  ipratropium-albuterol (DUONEB) 0.5-2.5 (3) MG/3ML nebulizer solution 3 mL (3 mLs Nebulization Given 03/21/24 1751)  furosemide (LASIX) injection 40 mg (40 mg Intravenous Given 03/21/24 1839)      ED Triage Vitals  Encounter Vitals Group     BP 03/21/24 1724 (!) 154/74     Systolic BP Percentile --      Diastolic BP Percentile --      Pulse Rate 03/21/24 1724 100     Resp 03/21/24 1724 18     Temp 03/21/24 1805 97.9 F (36.6 C)     Temp Source 03/21/24 1805 Oral     SpO2 03/21/24 1724 (!) 83 %     Weight --      Height --      Head Circumference --      Peak Flow --      Pain Score 03/21/24 1728 5     Pain Loc --      Pain Education --      Exclude from Growth Chart --   ZOXW(96)@     _________________________________________ Significant initial  Findings: Abnormal Labs Reviewed  BASIC METABOLIC PANEL WITH GFR - Abnormal; Notable for the following components:      Result Value    Glucose, Bld 165 (*)    All other components within normal limits  CBC - Abnormal; Notable for  the following components:   RDW 16.0 (*)    All other components within normal limits  BRAIN NATRIURETIC PEPTIDE - Abnormal; Notable for the following components:   B Natriuretic Peptide 431.9 (*)    All other components within normal limits  TROPONIN I (HIGH SENSITIVITY) - Abnormal; Notable for the following components:   Troponin I (High Sensitivity) 20 (*)    All other components within normal limits  _______________________ Troponin  ordered Cardiac Panel (last 3 results) Recent Labs    03/21/24 1740 03/21/24 1923  TROPONINIHS 17 20*   ECG: Ordered Personally reviewed and interpreted by me showing: HR : 103 Rhythm:Atrial fibrillation Borderline low voltage, extremity leads Prolonged QT interval QTC 649  BNP (last 3 results) Recent Labs    03/21/24 1740  BNP 431.9*     COVID-19 Labs  No results for input(s): "DDIMER", "FERRITIN", "LDH", "CRP" in the last 72 hours.  Lab Results  Component Value Date   SARSCOV2NAA NEGATIVE 03/21/2024   SARSCOV2NAA NEGATIVE 04/28/2021   SARSCOV2NAA NEGATIVE 07/04/2020   SARSCOV2NAA NEGATIVE 05/26/2020     The recent clinical data is shown below. Vitals:   03/21/24 2015 03/21/24 2030 03/21/24 2045 03/21/24 2138  BP: (!) 150/88 (!) 142/93 129/88 (!) 146/114  Pulse: 98 100 94 93  Resp: (!) 21 (!) 27 (!) 25 20  Temp:    97.6 F (36.4 C)  TempSrc:    Oral  SpO2: 94% 94% 100% 91%    WBC     Component Value Date/Time   WBC 8.2 03/21/2024 1740   LYMPHSABS 1.2 06/07/2022 1855   MONOABS 0.4 06/07/2022 1855   EOSABS 0.1 06/07/2022 1855   BASOSABS 0.1 06/07/2022 1855     Procalcitonin   Ordered      Results for orders placed or performed during the hospital encounter of 03/21/24  Resp panel by RT-PCR (RSV, Flu A&B, Covid) Anterior Nasal Swab     Status: None   Collection Time: 03/21/24  5:57 PM   Specimen: Anterior Nasal Swab   Result Value Ref Range Status   SARS Coronavirus 2 by RT PCR NEGATIVE NEGATIVE Final         Influenza A by PCR NEGATIVE NEGATIVE Final   Influenza B by PCR NEGATIVE NEGATIVE Final         Resp Syncytial Virus by PCR NEGATIVE NEGATIVE Final         VBG pending.  ABG    Component Value Date/Time   PHART 7.365 07/08/2020 0753   PCO2ART 39.6 07/08/2020 0753   PO2ART 91 07/08/2020 0753   HCO3 24.7 07/08/2020 0757   TCO2 26 07/08/2020 0757   ACIDBASEDEF 1.0 07/08/2020 0757   O2SAT 71.0 07/08/2020 0757   ______________________________________________________ Recent Labs  Lab 03/21/24 1740  NA 142  K 3.5  CO2 22  GLUCOSE 165*  BUN 14  CREATININE 1.14  CALCIUM 9.6    Cr     Up from baseline see below Lab Results  Component Value Date   CREATININE 1.14 03/21/2024   CREATININE 1.12 06/09/2023   CREATININE 1.03 05/15/2023    No results for input(s): "AST", "ALT", "ALKPHOS", "BILITOT", "PROT", "ALBUMIN" in the last 168 hours. Lab Results  Component Value Date   CALCIUM 9.6 03/21/2024   PHOS 2.2 (L) 07/13/2012    Plt: Lab Results  Component Value Date   PLT 173 03/21/2024       Recent Labs  Lab 03/21/24 1740  WBC 8.2  HGB 14.3  HCT 46.0  MCV 94.3  PLT 173    HG/HCT  stable,      Component Value Date/Time   HGB 14.3 03/21/2024 1740   HCT 46.0 03/21/2024 1740   MCV 94.3 03/21/2024 1740   MCV 85.5 11/11/2015 1029  __________________________________ Hospitalist was called for admission for   Acute on chronic congestive heart failure, acute respiratory failure with hypoxia   The following Work up has been ordered so far:  Orders Placed This Encounter  Procedures   Resp panel by RT-PCR (RSV, Flu A&B, Covid) Anterior Nasal Swab   DG Chest Port 1 View   Basic metabolic panel   CBC   Brain natriuretic peptide   Document Height and Actual Weight   Cardiac Monitoring - Continuous Indefinite   Consult to hospitalist   Consult to hospitalist   Oxygen  therapy Mode or (Route): Nasal cannula   ED EKG   EKG 12-Lead   EKG   EKG   Place in observation (patient's expected length of stay will be less than 2 midnights)     OTHER Significant initial  Findings:  labs showing:     DM  labs:  HbA1C: No results for input(s): "HGBA1C" in the last 8760 hours.    CBG (last 3)  No results for input(s): "GLUCAP" in the last 72 hours.     Cultures:    Component Value Date/Time   SDES  04/28/2021 1604    IN/OUT CATH URINE Performed at [facility], [address], [place], [place] [address]    [facility]  04/28/2021 1604    NONE Performed at [facility], [address] [place], [place] [address]    CULT 80,000 COLONIES/mL ENTEROCOCCUS FAECALIS (A) 04/28/2021 1604   REPTSTATUS 05/01/2021 FINAL 04/28/2021 1604     Radiological Exams on Admission: DG Chest Port 1 View Result Date: 03/21/2024 CLINICAL DATA:  Chest pain with shortness of breath. EXAM: PORTABLE CHEST 1 VIEW COMPARISON:  06/09/2023. FINDINGS: Heart is enlarged and the mediastinal contour is stable. The pulmonary vasculature is distended. Interstitial prominence is noted bilaterally with mild airspace disease at the lung bases. There are small bilateral pleural effusions. No pneumothorax is seen. No acute osseous abnormality. IMPRESSION: 1. Cardiomegaly with pulmonary vascular congestion. 2. Interstitial prominence bilaterally with airspace disease at the lung bases, possible edema or infiltrate. 3. Small bilateral pleural effusions. Electronically Signed   By: Wyvonnia Heimlich M.D.   On: 03/21/2024 18:41   _______________________________________________________________________________________________________ Latest  Blood pressure (!) 146/114, pulse 93, temperature 97.6 F (36.4 C), temperature source Oral, resp. rate 20, SpO2 91%.   Vitals  labs and radiology finding personally reviewed  Review of Systems:    Pertinent positives include:   chest pain shortness of breath at rest. Constitutional:  No weight loss, night sweats, Fevers, chills, fatigue, weight loss  HEENT:  No headaches, Difficulty swallowing,Tooth/dental problems,Sore throat,  No sneezing, itching, ear ache, nasal congestion, post nasal drip,  Cardio-vascular:  No , Orthopnea, PND, anasarca, dizziness, palpitations.no Bilateral lower extremity swelling  GI:  No heartburn, indigestion, abdominal pain, nausea, vomiting, diarrhea, change in bowel habits, loss of appetite, melena, blood in stool, hematemesis Resp:  no  No dyspnea on exertion, No excess mucus, no productive cough, No non-productive cough, No coughing up of blood.No change in color of mucus.No wheezing. Skin:  no rash or lesions. No jaundice GU:  no dysuria, change in color of urine, no urgency or frequency. No straining to urinate.  No flank  pain.  Musculoskeletal:  No joint pain or no joint swelling. No decreased range of motion. No back pain.  Psych:  No change in mood or affect. No depression or anxiety. No memory loss.  Neuro: no localizing neurological complaints, no tingling, no weakness, no double vision, no gait abnormality, no slurred speech, no confusion  All systems reviewed and apart from HOPI all are negative _______________________________________________________________________________________________ Past Medical History:   Past Medical History:  Diagnosis Date   Allergy    rhinitis   Atrial fibrillation (HCC) Jan 2007   echo 1-07 normal ejection fraction, no significant valvular disease. adenosine cardiolite 1-07  no evidence of ischemia. A 48 hour Holter monitor in 7-07 showed good rate controlw/ chronic atrial fibrillation. Cath 1/11 normal cors. EF 45%. Echo 2-11 60-65%   Benign prostatic hypertrophy    Cerebral aneurysm    followed by Dr Alvira Josephs   CHF (congestive heart failure) (HCC)    COPD (chronic obstructive pulmonary disease) (HCC)    Fatigue    History of  chest pain    a. s/p LHC 05/2014 with normal cors   Hx of colonic polyps    diverticulosis   Hyperlipidemia    IBS (irritable bowel syndrome)    Incontinence    Per pt 12/14/11   Kidney stones    Obesity    Obstructive sleep apnea    noncompliant with CPAP   Retinal vein occlusion    Past Surgical History:  Procedure Laterality Date   CARDIAC CATHETERIZATION  1999   CHOLECYSTECTOMY     HERNIA REPAIR     inguinal and umbilical herniorrhaphy     INTERSTIM IMPLANT PLACEMENT  04/2017   LEFT HEART CATHETERIZATION WITH CORONARY ANGIOGRAM N/A 06/01/2014   Procedure: LEFT HEART CATHETERIZATION WITH CORONARY ANGIOGRAM;  Surgeon: Peter M Swaziland, MD;  Location: [facility] CATH LAB;  Service: Cardiovascular;  Laterality: N/A;   LITHOTRIPSY     RIGHT/LEFT HEART CATH AND CORONARY ANGIOGRAPHY N/A 07/08/2020   Procedure: RIGHT/LEFT HEART CATH AND CORONARY ANGIOGRAPHY;  Surgeon: Mardell Shade, MD;  Location: [facility] INVASIVE CV LAB;  Service: Cardiovascular;  Laterality: N/A;   TEE WITHOUT CARDIOVERSION N/A 07/05/2020   Procedure: TRANSESOPHAGEAL ECHOCARDIOGRAM (TEE);  Surgeon: Mardell Shade, MD;  Location: [facility] ENDOSCOPY;  Service: Cardiovascular;  Laterality: N/A;   TEE WITHOUT CARDIOVERSION N/A 05/21/2023   Procedure: TRANSESOPHAGEAL ECHOCARDIOGRAM;  Surgeon: Mardell Shade, MD;  Location: [facility] INVASIVE CV LAB;  Service: Cardiovascular;  Laterality: N/A;   TONSILLECTOMY AND ADENOIDECTOMY  as a child    Social History:  Ambulatory  cane,      reports that he quit smoking about 52 years ago. His smoking use included cigarettes. He started smoking about 72 years ago. He has a 20 pack-year smoking history. He has never used smokeless tobacco. He reports current alcohol use of about 1.0 standard drink of alcohol per week. He reports that he does not use drugs.     Family History:   Family History  Problem Relation Age of Onset   Cancer Father        lung   Alcohol abuse Father    Aneurysm  Father        Aortic aneurysm   Cancer Maternal Grandmother        colon   Sudden death Mother    Heart disease Paternal Grandfather    Diabetes Son    Diabetes Maternal Uncle    Diabetes Paternal Uncle    ______________________________________________________________________________________________ Allergies: Allergies  Allergen Reactions   Epinephrine Palpitations    Increased Heart Rate   Nsaids Other (See Comments)    NOT WHILE TAKING ELIQUIS    Clarithromycin Other (See Comments)    headache   Famotidine Nausea Only   Iodinated Contrast Media Other (See Comments)    Pt states "turning bright red"   Nimodipine     Flushing   Pneumococcal Vaccines      Prior to Admission medications   Medication Sig Start Date End Date Taking? Authorizing Provider  albuterol (VENTOLIN HFA) 108 (90 Base) MCG/ACT inhaler Inhale 2 puffs into the lungs every 6 (six) hours as needed for wheezing or shortness of breath. 09/17/23   Cobb, Mariah Shines, NP  apixaban (ELIQUIS) 5 MG TABS tablet TAKE 1 TABLET BY MOUTH TWICE A DAY 01/22/24   [name], Daniel R, MD  azelastine (ASTELIN) 0.1 % nasal spray Place 1 spray into both nostrils daily.    [provider]  cholecalciferol (VITAMIN D3) 25 MCG (1000 UNIT) tablet Take 1,000 Units by mouth daily.    [provider]  Fluticasone-Umeclidin-Vilant (TRELEGY ELLIPTA) 100-62.5-25 MCG/ACT AEPB INHALE 1 PUFF INTO THE LUNGS DAILY IN THE AFTERNOON 08/28/23   Sood, Vineet, MD  furosemide (LASIX) 40 MG tablet Take 1 tablet (40 mg total) by mouth daily as needed. 03/14/22   [name], Daniel R, MD  gabapentin (NEURONTIN) 100 MG capsule Take 1 capsule (100 mg total) by mouth 3 (three) times daily for 14 days. 09/25/23 10/09/23  Rolinda Climes, DO  nitroGLYCERIN (NITROSTAT) 0.4 MG SL tablet Place 1 tablet (0.4 mg total) under the tongue every 5 (five) minutes as needed for chest pain. 06/12/23 09/10/23  Elmarie Hacking, FNP  omeprazole (PRILOSEC) 20 MG  capsule Take 1 capsule (20 mg total) by mouth daily. 06/12/23   Milford, Arlice Bene, FNP  potassium chloride SA (KLOR-CON M) 20 MEQ tablet Take 1 tablet (20 mEq total) by mouth daily as needed. 03/14/22   [name], Rheta Celestine, MD  UNABLE TO FIND C-Pap machine at bedtime    [provider]  vitamin B-12 (CYANOCOBALAMIN) 1000 MCG tablet Take 1,000 mcg by mouth every other day.     [provider]    ___________________________________________________________________________________________________ Physical Exam:    03/21/2024    9:38 PM 03/21/2024    8:45 PM 03/21/2024    8:30 PM  Vitals with BMI  Systolic 146 129 621  Diastolic 114 88 93  Pulse 93 94 100     1. General:  in No  Acute distress   Chronically ill   -appearing 2. Psychological: Alert and   Oriented 3. Head/ENT:   Moist   Mucous Membranes                          Head Non traumatic, neck supple                           Poor Dentition 4. SKIN: normal   Skin turgor,  Skin clean Dry and intact no rash    5. Heart: Regular rate and rhythm   Murmur, no Rub or gallop 6. Lungs: , no wheezes some crackles   7. Abdomen: Soft,  non-tender,   distended bowel sounds present 8. Lower extremities: no clubbing, cyanosis, 2+ edema 9. Neurologically Grossly intact, moving all 4 extremities equally intact 10. MSK: Normal range of motion    Chart has been reviewed  ______________________________________________________________________________________________  Assessment/Plan  89 y.o. male with medical history significant of atrial fibrillation on Eliquis,  OSA on CPAP, HTN, small intracranial aneurysms with spontaneous dissection of the right internal carotid artery in 2002, COPD, ILD, HLD, GERD    Admitted for   Acute on chronic diastolic CHF, acute respiratory failure with hypoxia  Present on Admission:  Acute heart failure with preserved ejection fraction (HFpEF) (HCC)  Atrial fibrillation, permanent (HCC)  BPH  (benign prostatic hyperplasia)  Chronic pain  COPD, moderate (HCC)  GERD  HLD (hyperlipidemia)  OBSTRUCTIVE SLEEP APNEA  Acute respiratory failure with hypoxia (HCC)  Interstitial lung disease (HCC)  Prolonged QT interval  Acute heart failure with preserved ejection fraction (HFpEF) (HCC) - Pt diagnosed with CHF based on presence of the following:  PND, OA, rales on exam,   cardiomegaly, Pulmonary edema on CXR, and   bilateral leg edema,      admit on telemetry,  cycle cardiac enzymes, Cardiac Panel (last 3 results) Recent Labs    03/21/24 1740 03/21/24 1923  TROPONINIHS 17 20*     obtain serial ECG  to evaluate for ischemia as a cause of heart failure  monitor daily weight: There were no vitals filed for this visit. Last BNP BNP (last 3 results) Recent Labs    03/21/24 1740  BNP 431.9*     diurese with IV lasix   40 mg IV Daily and monitor orthostatics and creatinine to avoid over diuresis.  Order echogram to evaluate EF and valves     Atrial fibrillation, permanent (HCC) Chronic stable continue Eliquis 5 mg p.o. twice daily patient does not   need rate control  BPH (benign prostatic hyperplasia) Continue home medications  Chronic pain Continue to cycle cardiac enzymes Recent cardiac cath resection showing no evidence of CAD somewhat reassuring Obtain echogram Noted slight troponin leak we will continue to cycle to see if there is any increase Slight troponin elevation could be in the setting of CHF Hold off on heparin as patient is already on Eliquis  If there is any evidence of significant troponin increase would need cardiology consult and potential transition to heparin  COPD, moderate (HCC) Although is a chronic condition currently unlikely to be the main driving factor for hypoxia Given no wheezing and evidence of CHF on chest x-ray Continue home medications  DM type 2 (diabetes mellitus, type 2) (HCC) Diet controlled order sliding  scale  GERD Continue Protonix attempt to use Carafate if needed  HLD (hyperlipidemia) Not on statin  OBSTRUCTIVE SLEEP APNEA Order CPAP  Acute respiratory failure with hypoxia (HCC)  this patient has acute respiratory failure with Hypoxia  as documented by the presence of following: O2 saturatio< 90% on RA   Likely due to CHF exacerbation,   Provide O2 therapy and titrate as needed  Continuous pulse ox   check Pulse ox with ambulation prior to discharge    flutter valve ordered   Interstitial lung disease (HCC) Chronic followed by pulmonology could be contributing to underlying decreased pulmonary reserve  Prolonged QT interval - will monitor on tele avoid QT prolonging medications, rehydrate correct electrolytes   Other plan as per orders.  DVT prophylaxis: Eliquis   Code Status:  DNR/DNI  as per patient  I had personally discussed CODE STATUS with patient and family ACP   none    Family Communication:   Family  at  Bedside  plan of care was discussed with Wife,  Diet diabetic heart healthy  Disposition Plan:        To home once workup is complete and patient is stable   Following barriers for discharge:                             Chest pain  work up is complete                            Electrolytes corrected                                                  Would benefit from PT/OT eval prior to DC  Ordered                    Consults called: none   Admission status:  ED Disposition     ED Disposition  Admit   Condition  --   Comment  Hospital Area: [facility] [[id number]]  Level of Care: Telemetry Cardiac [[id number]]  Interfacility transfer: Yes  May place patient in observation at [facility] or [facility] if equivalent level of care is available:: Yes  Covid Evaluation: Asymptomatic - no recent exposure (last 10 days) testing not required  Diagnosis: Acute heart failure with preserved ejection fraction (HFpEF) [facility]) [[id number]]   Admitting Physician: Angelene Kelly [phone]  Attending Physician: Angelene Kelly [phone]           Obs      Level of care     tele  For 24H    Lab Results  Component Value Date   SARSCOV2NAA NEGATIVE 03/21/2024     Alnita Aybar 03/21/2024, 10:57 PM    [facility]     after 2 AM please page floor coverage PA If 7AM-7PM, please contact the day team taking care of the patient using [email or web address]

## 2024-03-21 NOTE — Assessment & Plan Note (Signed)
 Continue Protonix attempt to use Carafate if needed

## 2024-03-21 NOTE — Assessment & Plan Note (Signed)
 Chronic followed by pulmonology could be contributing to underlying decreased pulmonary reserve

## 2024-03-21 NOTE — Assessment & Plan Note (Signed)
 this patient has acute respiratory failure with Hypoxia  as documented by the presence of following: O2 saturatio< 90% on RA   Likely due to CHF exacerbation,   Provide O2 therapy and titrate as needed  Continuous pulse ox   check Pulse ox with ambulation prior to discharge    flutter valve ordered

## 2024-03-21 NOTE — ED Triage Notes (Signed)
 He c/o chest pain with shortness of breath "a little worse than usual" x 1 week. His family are with him. They report a gradual lowering of SPO2 this week, where they observed pt. To have SPO2 of ~ 88%. They report today at home his SPO2 was 82% in the absence of any cough or fever, nor any other sign of current illness.

## 2024-03-21 NOTE — Assessment & Plan Note (Signed)
 Chronic stable continue Eliquis 5 mg p.o. twice daily patient does not   need rate control

## 2024-03-21 NOTE — Assessment & Plan Note (Signed)
-   Pt diagnosed with CHF based on presence of the following:  PND, OA, rales on exam,   cardiomegaly, Pulmonary edema on CXR, and   bilateral leg edema,      admit on telemetry,  cycle cardiac enzymes, Cardiac Panel (last 3 results) Recent Labs    03/21/24 1740 03/21/24 1923  TROPONINIHS 17 20*     obtain serial ECG  to evaluate for ischemia as a cause of heart failure  monitor daily weight: There were no vitals filed for this visit. Last BNP BNP (last 3 results) Recent Labs    03/21/24 1740  BNP 431.9*     diurese with IV lasix   40 mg IV Daily and monitor orthostatics and creatinine to avoid over diuresis.  Order echogram to evaluate EF and valves

## 2024-03-22 ENCOUNTER — Observation Stay (HOSPITAL_COMMUNITY)

## 2024-03-22 ENCOUNTER — Encounter (HOSPITAL_COMMUNITY): Payer: Self-pay | Admitting: Internal Medicine

## 2024-03-22 DIAGNOSIS — Z66 Do not resuscitate: Secondary | ICD-10-CM | POA: Diagnosis not present

## 2024-03-22 DIAGNOSIS — I5031 Acute diastolic (congestive) heart failure: Secondary | ICD-10-CM

## 2024-03-22 DIAGNOSIS — J449 Chronic obstructive pulmonary disease, unspecified: Secondary | ICD-10-CM | POA: Diagnosis not present

## 2024-03-22 DIAGNOSIS — J849 Interstitial pulmonary disease, unspecified: Secondary | ICD-10-CM | POA: Diagnosis not present

## 2024-03-22 DIAGNOSIS — E785 Hyperlipidemia, unspecified: Secondary | ICD-10-CM | POA: Diagnosis not present

## 2024-03-22 DIAGNOSIS — Z87891 Personal history of nicotine dependence: Secondary | ICD-10-CM | POA: Diagnosis not present

## 2024-03-22 DIAGNOSIS — I110 Hypertensive heart disease with heart failure: Secondary | ICD-10-CM | POA: Diagnosis not present

## 2024-03-22 DIAGNOSIS — Z1152 Encounter for screening for COVID-19: Secondary | ICD-10-CM | POA: Diagnosis not present

## 2024-03-22 DIAGNOSIS — Z833 Family history of diabetes mellitus: Secondary | ICD-10-CM | POA: Diagnosis not present

## 2024-03-22 DIAGNOSIS — G8929 Other chronic pain: Secondary | ICD-10-CM | POA: Diagnosis not present

## 2024-03-22 DIAGNOSIS — Z91128 Patient's intentional underdosing of medication regimen for other reason: Secondary | ICD-10-CM | POA: Diagnosis not present

## 2024-03-22 DIAGNOSIS — G4733 Obstructive sleep apnea (adult) (pediatric): Secondary | ICD-10-CM | POA: Diagnosis not present

## 2024-03-22 DIAGNOSIS — Z79899 Other long term (current) drug therapy: Secondary | ICD-10-CM | POA: Diagnosis not present

## 2024-03-22 DIAGNOSIS — Z7901 Long term (current) use of anticoagulants: Secondary | ICD-10-CM | POA: Diagnosis not present

## 2024-03-22 DIAGNOSIS — I5033 Acute on chronic diastolic (congestive) heart failure: Secondary | ICD-10-CM | POA: Diagnosis not present

## 2024-03-22 DIAGNOSIS — Z811 Family history of alcohol abuse and dependence: Secondary | ICD-10-CM | POA: Diagnosis not present

## 2024-03-22 DIAGNOSIS — T501X6A Underdosing of loop [high-ceiling] diuretics, initial encounter: Secondary | ICD-10-CM | POA: Diagnosis not present

## 2024-03-22 DIAGNOSIS — K219 Gastro-esophageal reflux disease without esophagitis: Secondary | ICD-10-CM | POA: Diagnosis not present

## 2024-03-22 DIAGNOSIS — I509 Heart failure, unspecified: Secondary | ICD-10-CM

## 2024-03-22 DIAGNOSIS — J9601 Acute respiratory failure with hypoxia: Secondary | ICD-10-CM | POA: Diagnosis not present

## 2024-03-22 DIAGNOSIS — N400 Enlarged prostate without lower urinary tract symptoms: Secondary | ICD-10-CM | POA: Diagnosis not present

## 2024-03-22 DIAGNOSIS — I4821 Permanent atrial fibrillation: Secondary | ICD-10-CM | POA: Diagnosis not present

## 2024-03-22 DIAGNOSIS — R7989 Other specified abnormal findings of blood chemistry: Secondary | ICD-10-CM | POA: Diagnosis present

## 2024-03-22 DIAGNOSIS — Z8249 Family history of ischemic heart disease and other diseases of the circulatory system: Secondary | ICD-10-CM | POA: Diagnosis not present

## 2024-03-22 DIAGNOSIS — R0902 Hypoxemia: Secondary | ICD-10-CM | POA: Diagnosis present

## 2024-03-22 LAB — ECHOCARDIOGRAM COMPLETE
AR max vel: 1.73 cm2
AV Area VTI: 2.03 cm2
AV Area mean vel: 1.81 cm2
AV Mean grad: 3.3 mmHg
AV Peak grad: 7.3 mmHg
Ao pk vel: 1.35 m/s
Area-P 1/2: 2.78 cm2
Calc EF: 66.5 %
Height: 70 in
MV VTI: 1.63 cm2
S' Lateral: 2.6 cm
Single Plane A2C EF: 62.2 %
Single Plane A4C EF: 73 %
Weight: 2839.52 [oz_av]

## 2024-03-22 LAB — COMPREHENSIVE METABOLIC PANEL WITH GFR
ALT: 13 U/L (ref 0–44)
AST: 20 U/L (ref 15–41)
Albumin: 3.6 g/dL (ref 3.5–5.0)
Alkaline Phosphatase: 59 U/L (ref 38–126)
Anion gap: 9 (ref 5–15)
BUN: 14 mg/dL (ref 8–23)
CO2: 26 mmol/L (ref 22–32)
Calcium: 9.2 mg/dL (ref 8.9–10.3)
Chloride: 108 mmol/L (ref 98–111)
Creatinine, Ser: 1.02 mg/dL (ref 0.61–1.24)
GFR, Estimated: >60 mL/min (ref >60)
Glucose, Bld: 143 mg/dL — ABNORMAL HIGH (ref 70–99)
Potassium: 3.8 mmol/L (ref 3.5–5.1)
Sodium: 143 mmol/L (ref 135–145)
Total Bilirubin: 2.5 mg/dL — ABNORMAL HIGH (ref 0.0–1.2)
Total Protein: 6.7 g/dL (ref 6.5–8.1)

## 2024-03-22 LAB — CBC
HCT: 44.5 % (ref 39.0–52.0)
Hemoglobin: 13.7 g/dL (ref 13.0–17.0)
MCH: 30 pg (ref 26.0–34.0)
MCHC: 30.8 g/dL (ref 30.0–36.0)
MCV: 97.4 fL (ref 80.0–100.0)
Platelets: 150 10*3/uL (ref 150–400)
RBC: 4.57 MIL/uL (ref 4.22–5.81)
RDW: 15.9 % — ABNORMAL HIGH (ref 11.5–15.5)
WBC: 6.8 10*3/uL (ref 4.0–10.5)
nRBC: 0 % (ref 0.0–0.2)

## 2024-03-22 LAB — GLUCOSE, CAPILLARY
Glucose-Capillary: 143 mg/dL — ABNORMAL HIGH (ref 70–99)
Glucose-Capillary: 124 mg/dL — ABNORMAL HIGH (ref 70–99)
Glucose-Capillary: 96 mg/dL (ref 70–99)
Glucose-Capillary: 203 mg/dL — ABNORMAL HIGH (ref 70–99)

## 2024-03-22 LAB — PHOSPHORUS: Phosphorus: 3 mg/dL (ref 2.5–4.6)

## 2024-03-22 LAB — MAGNESIUM: Magnesium: 1.9 mg/dL (ref 1.7–2.4)

## 2024-03-22 LAB — T4, FREE: Free T4: 1.23 ng/dL — ABNORMAL HIGH (ref 0.61–1.12)

## 2024-03-22 LAB — TROPONIN I (HIGH SENSITIVITY): Troponin I (High Sensitivity): 23 ng/L — ABNORMAL HIGH (ref <18)

## 2024-03-22 MED ORDER — HYDRALAZINE HCL 20 MG/ML IJ SOLN: 10.0000 mg | INTRAMUSCULAR | Status: DC | PRN

## 2024-03-22 MED ORDER — ONDANSETRON HCL 4 MG/2ML IJ SOLN
4.0000 mg | Freq: Once | INTRAMUSCULAR | Status: AC
  Administered 2024-03-22: 4 mg via INTRAVENOUS
  Filled 2024-03-22: qty 2

## 2024-03-22 MED ORDER — GUAIFENESIN 100 MG/5ML PO LIQD: 5.0000 mL | ORAL | Status: DC | PRN

## 2024-03-22 MED ORDER — SENNOSIDES-DOCUSATE SODIUM 8.6-50 MG PO TABS
1.0000 | ORAL_TABLET | Freq: Every evening | ORAL | Status: DC | PRN
Start: 2024-03-22 — End: 2024-03-24

## 2024-03-22 MED ORDER — METOPROLOL TARTRATE 5 MG/5ML IV SOLN: 5.0000 mg | INTRAVENOUS | Status: DC | PRN

## 2024-03-22 MED ORDER — IPRATROPIUM-ALBUTEROL 0.5-2.5 (3) MG/3ML IN SOLN: 3.0000 mL | RESPIRATORY_TRACT | Status: DC | PRN

## 2024-03-22 NOTE — Hospital Course (Addendum)
 Brief Narrative:   89 year old with history of A-fib on Eliquis, OSA on CPAP, HTN, small intracranial aneurysm with dissection in the right ICA in 2002, COPD, ILD, HLD, GERD, BPH, CHF EF 65% admitted to the hospital for CHF exacerbation and started on IV Lasix.  Assessment & Plan:  Principal Problem:   Acute heart failure with preserved ejection fraction (HFpEF) (HCC) Active Problems:   HLD (hyperlipidemia)   OBSTRUCTIVE SLEEP APNEA   Atrial fibrillation, permanent (HCC)   GERD   BPH (benign prostatic hyperplasia)   COPD, moderate (HCC)   Chronic pain   Interstitial lung disease (HCC)   Acute respiratory failure with hypoxia (HCC)   Prolonged QT interval   Elevated TSH   Acute respiratory distress CHF exacerbation with preserved EF - Upon admission chest x-ray showed evidence of pulmonary vascular congestion with bilateral effusion, BNP was slightly elevated.  He was started on IV Lasix.  Fluid restriction - Echocardiogram - Replete electrolytes as appropriate - I-S/flutter valve  Prolonged QTc - Suspect secondary to A-fib and artifact.  Will repeat EKG  COPD with history of ILD - Bronchodilators.  Follows outpatient pulmonary  Chronic atrial fibrillation - On Eliquis  Obstructive sleep apnea - Nocturnal CPAP  GERD - PPI  Hyperlipidemia - Not on any statin  BPH - Proscar   DVT prophylaxis: SCDs Start: 03/21/24 2254 apixaban (ELIQUIS) tablet 5 mg      Code Status: Limited: Do not attempt resuscitation (DNR) -DNR-LIMITED -Do Not Intubate/DNI  Family Communication:   Ongoing diuresis.  PT/OT    Subjective: Seen at bedside.  Still has some exertional dyspnea and lower extremity swelling.   Examination:  General exam: Appears calm and comfortable  Respiratory system: Bibasilar crackles Cardiovascular system: S1 & S2 heard, RRR. No JVD, murmurs, rubs, gallops or clicks 1-2+ lower extremity edema Gastrointestinal system: Abdomen is nondistended, soft and  nontender. No organomegaly or masses felt. Normal bowel sounds heard. Central nervous system: Alert and oriented. No focal neurological deficits. Extremities: Symmetric 5 x 5 power. Skin: No rashes, lesions or ulcers Psychiatry: Judgement and insight appear normal. Mood & affect appropriate.

## 2024-03-22 NOTE — Assessment & Plan Note (Signed)
 Mild will check t3 t4 levels

## 2024-03-22 NOTE — Evaluation (Signed)
 Occupational Therapy Evaluation Patient Details Name: Maurice Wood MRN: [id number] DOB: Jun 12, 1933 Today's Date: 03/22/2024   History of Present Illness   Patient is a 89 year old male who presented on 4/12 with shortness of breath. Patient was admitted with acute heart failure with preserved ejection fraction and acute respiratory failure with hypoxia. GEX:BMWUXL fibrillation,OSA, HTN, small intracranial aneurysms with spontaneous dissection of the right internal carotid artery in 2002, COPD, ILD, HLD, GERD, BPH,     Clinical Impressions Patient is a 89 year old male who was admitted for above. Patient was living at home with independence in ADLs with wife. Currently, patient noted to drop to 82% on 4L/min with minimal activity. Patient reporting feeling dizzy after standing for hygiene tasks with BP 129/84 mmhg. Patient was noted to have decreased functional activity tolerance, decreased endurance, decreased standing balance, decreased safety awareness, and decreased knowledge of AD/AE impacting participation in ADLs. Patient plans to d/c home with wife support and HH services at time of d/c. Patient would continue to benefit from skilled OT services at this time while admitted and after d/c to address noted deficits in order to improve overall safety and independence in ADLs.       If plan is discharge home, recommend the following:   A little help with walking and/or transfers;A little help with bathing/dressing/bathroom;Assistance with cooking/housework;Direct supervision/assist for medications management;Assist for transportation;Help with stairs or ramp for entrance;Direct supervision/assist for financial management     Functional Status Assessment   Patient has had a recent decline in their functional status and demonstrates the ability to make significant improvements in function in a reasonable and predictable amount of time.     Equipment Recommendations   None recommended  by OT      Precautions/Restrictions   Precautions Precautions: Fall Precaution/Restrictions Comments: monitor O2 Restrictions Weight Bearing Restrictions Per Provider Order: No     Mobility Bed Mobility Overal bed mobility: Needs Assistance Bed Mobility: Supine to Sit, Sit to Supine     Supine to sit: Supervision Sit to supine: Supervision                    Balance Overall balance assessment: Mild deficits observed, not formally tested                                         ADL either performed or assessed with clinical judgement   ADL Overall ADL's : Needs assistance/impaired Eating/Feeding: Modified independent;Sitting Eating/Feeding Details (indicate cue type and reason): educated on importance of sitting up on EOB or sitting in recliner to eat all meals. patient and wife verbalized understanding. Grooming: Sitting Grooming Details (indicate cue type and reason): patients wife was noted to be anxious and attempted to comb hair for patient even with education provided on patient needing to try for himself. at end of session quikc pep talk was provided to patient to empower him to complete tasks he can for himself and wife was educated on saving aid towards tasks that he is really unable to complete himself. Upper Body Bathing: Sitting;Contact guard assist   Lower Body Bathing: Sitting/lateral leans;Minimal assistance;Contact guard assist Lower Body Bathing Details (indicate cue type and reason): able to complete figure four posture for simluated tasks. Upper Body Dressing : Sitting;Contact guard assist   Lower Body Dressing: Sitting/lateral leans;Minimal assistance   Toilet Transfer: Minimal assistance;Ambulation Toilet Transfer Details (  indicate cue type and reason): HHA with patient unsteady in standing.   Toileting - Clothing Manipulation Details (indicate cue type and reason): patient having onadult absorbent undergarment at start of  session with wife vocing concerns that it is soiled. patient was educated on how we want to change out of anything that is wet to prevent skin breakdown and infections. patietn and wife verbalized understanding. patients wife anxious with suggestion to take off absorbent undergarment even though soiiled because she did not have a replacement at this time. patient and wife educated that it would be ok for temporary not have one while thier children picked up a pack to bring here. patients wife declined to have it removed from patient at this time with patients wife wipping patients bottom for him in standing with no agreeableness to let patient compelte task for himself.             Vision Baseline Vision/History: 1 Wears glasses Vision Assessment?: No apparent visual deficits            Pertinent Vitals/Pain Pain Assessment Pain Assessment: No/denies pain     Extremity/Trunk Assessment Upper Extremity Assessment Upper Extremity Assessment: Overall WFL for tasks assessed   Lower Extremity Assessment Lower Extremity Assessment: Defer to PT evaluation   Cervical / Trunk Assessment Cervical / Trunk Assessment: Normal   Communication     Cognition Arousal: Alert Behavior During Therapy: WFL for tasks assessed/performed Cognition: No apparent impairments             OT - Cognition Comments: wife present as well.                 Following commands: Intact       Cueing  General Comments      patietns O2 was noted to drop to 82% after standing EOB  on 2L/min to change linens and wife to address patients bottom after declining for others to assist or let patient attempt task. patient upon sitting down reported he felt dizzy BP was 129/84 mmhg. patient after a few mins sitting reported he was not dizzy anymove.           Home Living Family/patient expects to be discharged to:: Private residence Living Arrangements: Spouse/significant other Available Help at  Discharge: Family;Available PRN/intermittently Type of Home: House Home Access: Stairs to enter [employer] of Steps: 1   Home Layout: One level     Bathroom Shower/Tub: Tub/shower unit         Home Equipment: Cane - single point          Prior Functioning/Environment Prior Level of Function : Independent/Modified Independent                    OT Problem List: Impaired balance (sitting and/or standing);Decreased knowledge of precautions;Decreased activity tolerance;Decreased knowledge of use of DME or AE;Decreased safety awareness;Cardiopulmonary status limiting activity   OT Treatment/Interventions: Self-care/ADL training;DME and/or AE instruction;Therapeutic activities;Balance training;Patient/family education;Energy conservation      OT Goals(Current goals can be found in the care plan section)   Acute Rehab OT Goals Patient Stated Goal: to go home OT Goal Formulation: With patient/family Time For Goal Achievement: 04/05/24 Potential to Achieve Goals: Fair   OT Frequency:  Min 2X/week       AM-PAC OT "6 Clicks" Daily Activity     Outcome Measure Help from another person eating meals?: A Little Help from another person taking care of personal grooming?: A Little Help from another  person toileting, which includes using toliet, bedpan, or urinal?: A Lot Help from another person bathing (including washing, rinsing, drying)?: A Lot Help from another person to put on and taking off regular upper body clothing?: A Little Help from another person to put on and taking off regular lower body clothing?: A Lot 6 Click Score: 15   End of Session Equipment Utilized During Treatment: Oxygen Nurse Communication: Mobility status  Activity Tolerance: Patient limited by fatigue Patient left: in bed;with call bell/phone within reach  OT Visit Diagnosis: Unsteadiness on feet (R26.81);Other abnormalities of gait and mobility (R26.89)                Time:  9629-5284 OT Time Calculation (min): 26 min Charges:  OT General Charges $OT Visit: 1 Visit OT Evaluation $OT Eval Low Complexity: 1 Low OT Treatments $Self Care/Home Management : 8-22 mins  Wynette Heckler, MS Acute Rehabilitation Department Office# [phone]   Jame Maze 03/22/2024, 4:20 PM

## 2024-03-22 NOTE — Progress Notes (Signed)
   03/22/24 0022  BiPAP/CPAP/SIPAP  $ Non-Invasive Home Ventilator  Initial  $ Face Mask Medium Yes  BiPAP/CPAP/SIPAP Pt Type Adult  BiPAP/CPAP/SIPAP Resmed  Mask Type Full face mask  Dentures removed? Not applicable  Mask Size Medium  EPAP 12 cmH2O  Flow Rate 3 lpm  Patient Home Machine No  Patient Home Mask No  Patient Home Tubing No  Auto Titrate No  CPAP/SIPAP surface wiped down Yes  Device Plugged into RED Power Outlet Yes  BiPAP/CPAP /SiPAP Vitals  Pulse Rate 85  Resp 20  SpO2 97 %  Bilateral Breath Sounds Diminished  MEWS Score/Color  MEWS Score 0  MEWS Score Color Marrie Sizer

## 2024-03-22 NOTE — Progress Notes (Signed)
 PT Cancellation Note  Patient Details Name: Maurice Wood MRN: [id number] DOB: 1933/11/03   Cancelled Treatment:    Reason Eval/Treat Not Completed: Other (comment);Patient at procedure or test/unavailable. Attempted PT eval 3x this morning but unable due to EKG, then MD in room, then ECHO. Will continue to follow for PT eval as schedule permits.   Judyann Number PT, DPT 03/22/24, 11:42 AM

## 2024-03-22 NOTE — Progress Notes (Signed)
 PROGRESS NOTE    Maurice Wood  WUJ:[id number] DOB: [birth year] DOA: 03/21/2024 PCP: Rae Bugler, MD    Brief Narrative:   89 year old with history of A-fib on Eliquis, OSA on CPAP, HTN, small intracranial aneurysm with dissection in the right ICA in 2002, COPD, ILD, HLD, GERD, BPH, CHF EF 65% admitted to the hospital for CHF exacerbation and started on IV Lasix.  Assessment & Plan:  Principal Problem:   Acute heart failure with preserved ejection fraction (HFpEF) (HCC) Active Problems:   HLD (hyperlipidemia)   OBSTRUCTIVE SLEEP APNEA   Atrial fibrillation, permanent (HCC)   GERD   BPH (benign prostatic hyperplasia)   COPD, moderate (HCC)   Chronic pain   Interstitial lung disease (HCC)   Acute respiratory failure with hypoxia (HCC)   Prolonged QT interval   Elevated TSH   Acute respiratory distress CHF exacerbation with preserved EF - Upon admission chest x-ray showed evidence of pulmonary vascular congestion with bilateral effusion, BNP was slightly elevated.  He was started on IV Lasix.  Fluid restriction - Echocardiogram - Replete electrolytes as appropriate - I-S/flutter valve  Prolonged QTc - Suspect secondary to A-fib and artifact.  Will repeat EKG  COPD with history of ILD - Bronchodilators.  Follows outpatient pulmonary  Chronic atrial fibrillation - On Eliquis  Obstructive sleep apnea - Nocturnal CPAP  GERD - PPI  Hyperlipidemia - Not on any statin  BPH - Proscar   DVT prophylaxis: SCDs Start: 03/21/24 2254 apixaban (ELIQUIS) tablet 5 mg      Code Status: Limited: Do not attempt resuscitation (DNR) -DNR-LIMITED -Do Not Intubate/DNI  Family Communication:   Ongoing diuresis.  PT/OT    Subjective: Seen at bedside.  Still has some exertional dyspnea and lower extremity swelling.   Examination:  General exam: Appears calm and comfortable  Respiratory system: Bibasilar crackles Cardiovascular system: S1 & S2 heard, RRR. No JVD, murmurs,  rubs, gallops or clicks 1-2+ lower extremity edema Gastrointestinal system: Abdomen is nondistended, soft and nontender. No organomegaly or masses felt. Normal bowel sounds heard. Central nervous system: Alert and oriented. No focal neurological deficits. Extremities: Symmetric 5 x 5 power. Skin: No rashes, lesions or ulcers Psychiatry: Judgement and insight appear normal. Mood & affect appropriate.                Diet Orders (From admission, onward)     Start     Ordered   03/21/24 2305  Diet Heart Room service appropriate? Yes; Fluid consistency: Thin  Diet effective now       Question Answer Comment  Room service appropriate? Yes   Fluid consistency: Thin      03/21/24 2304            Objective: Vitals:   03/22/24 0022 03/22/24 0024 03/22/24 0501 03/22/24 1051  BP:  (!) 137/90 122/84   Pulse: 85 71 80   Resp: 20 20 20    Temp:  (!) 97.5 F (36.4 C) 97.7 F (36.5 C)   TempSrc:  Oral Oral   SpO2: 97% 98% 98% 97%  Weight:  77.4 kg 80.5 kg   Height:  5\' 10"  (1.778 m)      Intake/Output Summary (Last 24 hours) at 03/22/2024 1142 Last data filed at 03/22/2024 1041 Gross per 24 hour  Intake 243 ml  Output 3300 ml  Net -3057 ml   Filed Weights   03/22/24 0024 03/22/24 0501  Weight: 77.4 kg 80.5 kg    Scheduled Meds:  apixaban  5 mg Oral BID   budeson-glycopyrrolate-formoterol  2 puff Inhalation BID   furosemide  40 mg Intravenous Daily   pantoprazole  40 mg Oral Daily   sodium chloride flush  3 mL Intravenous Q12H   Continuous Infusions:  sodium chloride      Nutritional status     Body mass index is 25.46 kg/m.  Data Reviewed:   CBC: Recent Labs  Lab 03/21/24 1740 03/22/24 0355  WBC 8.2 6.8  HGB 14.3 13.7  HCT 46.0 44.5  MCV 94.3 97.4  PLT 173 150   Basic Metabolic Panel: Recent Labs  Lab 03/21/24 1740 03/21/24 2244 03/22/24 0355  NA 142 139 143  K 3.5 3.8 3.8  CL 110 108 108  CO2 22 22 26   GLUCOSE 165* 194* 143*  BUN 14  13 14   CREATININE 1.14 0.99 1.02  CALCIUM 9.6 9.1 9.2  MG  --  1.8 1.9  PHOS  --  2.6 3.0   GFR: Estimated Creatinine Clearance: 51.7 mL/min (by C-G formula based on SCr of 1.02 mg/dL). Liver Function Tests: Recent Labs  Lab 03/21/24 2244 03/22/24 0355  AST 22 20  ALT 14 13  ALKPHOS 62 59  BILITOT 2.3* 2.5*  PROT 6.9 6.7  ALBUMIN 3.8 3.6   No results for input(s): "LIPASE", "AMYLASE" in the last 168 hours. No results for input(s): "AMMONIA" in the last 168 hours. Coagulation Profile: No results for input(s): "INR", "PROTIME" in the last 168 hours. Cardiac Enzymes: No results for input(s): "CKTOTAL", "CKMB", "CKMBINDEX", "TROPONINI" in the last 168 hours. BNP (last 3 results) No results for input(s): "PROBNP" in the last 8760 hours. HbA1C: No results for input(s): "HGBA1C" in the last 72 hours. CBG: Recent Labs  Lab 03/22/24 0758  GLUCAP 143*   Lipid Profile: No results for input(s): "CHOL", "HDL", "LDLCALC", "TRIG", "CHOLHDL", "LDLDIRECT" in the last 72 hours. Thyroid Function Tests: Recent Labs    03/21/24 2244 03/22/24 0355  TSH 5.170*  --   FREET4  --  1.23*   Anemia Panel: No results for input(s): "VITAMINB12", "FOLATE", "FERRITIN", "TIBC", "IRON", "RETICCTPCT" in the last 72 hours. Sepsis Labs: Recent Labs  Lab 03/21/24 2244  PROCALCITON <0.10    Recent Results (from the past 240 hours)  Resp panel by RT-PCR (RSV, Flu A&B, Covid) Anterior Nasal Swab     Status: None   Collection Time: 03/21/24  5:57 PM   Specimen: Anterior Nasal Swab  Result Value Ref Range Status   SARS Coronavirus 2 by RT PCR NEGATIVE NEGATIVE Final    Comment: (NOTE) SARS-CoV-2 target nucleic acids are NOT DETECTED.  The SARS-CoV-2 RNA is generally detectable in upper respiratory specimens during the acute phase of infection. The lowest concentration of SARS-CoV-2 viral copies this assay can detect is 138 copies/mL. A negative result does not preclude SARS-Cov-2 infection  and should not be used as the sole basis for treatment or other patient management decisions. A negative result may occur with  improper specimen collection/handling, submission of specimen other than nasopharyngeal swab, presence of viral mutation(s) within the areas targeted by this assay, and inadequate number of viral copies(<138 copies/mL). A negative result must be combined with clinical observations, patient history, and epidemiological information. The expected result is Negative.  Fact Sheet for Patients:  BloggerCourse.com  Fact Sheet for Healthcare Providers:  SeriousBroker.it  This test is no t yet approved or cleared by the [place]  FDA and  has been authorized for detection and/or diagnosis of SARS-CoV-2 by  FDA under an Emergency Use Authorization (EUA). This EUA will remain  in effect (meaning this test can be used) for the duration of the COVID-19 declaration under Section 564(b)(1) of the Act, 21 U.S.C.section 360bbb-3(b)(1), unless the authorization is terminated  or revoked sooner.       Influenza A by PCR NEGATIVE NEGATIVE Final   Influenza B by PCR NEGATIVE NEGATIVE Final    Comment: (NOTE) The Xpert Xpress SARS-CoV-2/FLU/RSV plus assay is intended as an aid in the diagnosis of influenza from Nasopharyngeal swab specimens and should not be used as a sole basis for treatment. Nasal washings and aspirates are unacceptable for Xpert Xpress SARS-CoV-2/FLU/RSV testing.  Fact Sheet for Patients: BloggerCourse.com  Fact Sheet for Healthcare Providers: SeriousBroker.it  This test is not yet approved or cleared by the [place]  FDA and has been authorized for detection and/or diagnosis of SARS-CoV-2 by FDA under an Emergency Use Authorization (EUA). This EUA will remain in effect (meaning this test can be used) for the duration of the COVID-19 declaration  under Section 564(b)(1) of the Act, 21 U.S.C. section 360bbb-3(b)(1), unless the authorization is terminated or revoked.     Resp Syncytial Virus by PCR NEGATIVE NEGATIVE Final    Comment: (NOTE) Fact Sheet for Patients: BloggerCourse.com  Fact Sheet for Healthcare Providers: SeriousBroker.it  This test is not yet approved or cleared by the [place]  FDA and has been authorized for detection and/or diagnosis of SARS-CoV-2 by FDA under an Emergency Use Authorization (EUA). This EUA will remain in effect (meaning this test can be used) for the duration of the COVID-19 declaration under Section 564(b)(1) of the Act, 21 U.S.C. section 360bbb-3(b)(1), unless the authorization is terminated or revoked.  Performed at [facility], [address] [place], [place] [address]          Radiology Studies: [facility] Chest [facility] 1 View Result Date: 03/21/2024 CLINICAL DATA:  Chest pain with shortness of breath. EXAM: PORTABLE CHEST 1 VIEW COMPARISON:  06/09/2023. FINDINGS: Heart is enlarged and the mediastinal contour is stable. The pulmonary vasculature is distended. Interstitial prominence is noted bilaterally with mild airspace disease at the lung bases. There are small bilateral pleural effusions. No pneumothorax is seen. No acute osseous abnormality. IMPRESSION: 1. Cardiomegaly with pulmonary vascular congestion. 2. Interstitial prominence bilaterally with airspace disease at the lung bases, possible edema or infiltrate. 3. Small bilateral pleural effusions. Electronically Signed   By: Wyvonnia Heimlich M.D.   On: 03/21/2024 18:41           LOS: 0 days   Time spent= 35 mins    Maggie Schooner, MD [facility]  If 7PM-7AM, please contact night-coverage  03/22/2024, 11:42 AM

## 2024-03-22 NOTE — Progress Notes (Signed)
  Echocardiogram 2D Echocardiogram has been performed.  Draedyn Weidinger L Vaudine Dutan RDCS 03/22/2024, 11:48 AM

## 2024-03-23 DIAGNOSIS — I5031 Acute diastolic (congestive) heart failure: Secondary | ICD-10-CM | POA: Diagnosis not present

## 2024-03-23 LAB — BRAIN NATRIURETIC PEPTIDE: B Natriuretic Peptide: 187.3 pg/mL — ABNORMAL HIGH (ref 0.0–100.0)

## 2024-03-23 LAB — GLUCOSE, CAPILLARY
Glucose-Capillary: 125 mg/dL — ABNORMAL HIGH (ref 70–99)
Glucose-Capillary: 153 mg/dL — ABNORMAL HIGH (ref 70–99)
Glucose-Capillary: 115 mg/dL — ABNORMAL HIGH (ref 70–99)
Glucose-Capillary: 170 mg/dL — ABNORMAL HIGH (ref 70–99)

## 2024-03-23 LAB — BASIC METABOLIC PANEL WITH GFR
Anion gap: 11 (ref 5–15)
BUN: 15 mg/dL (ref 8–23)
CO2: 26 mmol/L (ref 22–32)
Calcium: 8.7 mg/dL — ABNORMAL LOW (ref 8.9–10.3)
Chloride: 102 mmol/L (ref 98–111)
Creatinine, Ser: 0.96 mg/dL (ref 0.61–1.24)
GFR, Estimated: >60 mL/min (ref >60)
Glucose, Bld: 141 mg/dL — ABNORMAL HIGH (ref 70–99)
Potassium: 3.4 mmol/L — ABNORMAL LOW (ref 3.5–5.1)
Sodium: 139 mmol/L (ref 135–145)

## 2024-03-23 LAB — HEMOGLOBIN A1C
Hgb A1c MFr Bld: 6.2 % — ABNORMAL HIGH (ref 4.8–5.6)
Mean Plasma Glucose: 131 mg/dL

## 2024-03-23 LAB — MAGNESIUM: Magnesium: 1.9 mg/dL (ref 1.7–2.4)

## 2024-03-23 LAB — T3: T3, Total: 111 ng/dL (ref 71–180)

## 2024-03-23 MED ORDER — POTASSIUM CHLORIDE CRYS ER 20 MEQ PO TBCR
40.0000 meq | EXTENDED_RELEASE_TABLET | Freq: Once | ORAL | Status: AC
  Administered 2024-03-23: 40 meq via ORAL
  Filled 2024-03-23: qty 2

## 2024-03-23 NOTE — Progress Notes (Signed)
 Heart Failure Navigator Progress Note  Assessed for Heart & Vascular TOC clinic readiness.  Patient does not meet criteria due to Advanced Heart Failure Team patient of Dr. Gala Romney. .   Navigator will sign off at this time.   Rhae Hammock, BSN, Scientist, clinical (histocompatibility and immunogenetics) Only

## 2024-03-23 NOTE — Evaluation (Signed)
 Physical Therapy Evaluation Patient Details Name: Maurice Wood MRN: [id number] DOB: 1933-01-29 Today's Date: 03/23/2024  History of Present Illness  Patient is a 89 year old male who presented on 4/12 with shortness of breath. Patient was admitted with acute heart failure with preserved ejection fraction and acute respiratory failure with hypoxia. BMW:UXLKGM fibrillation,OSA, HTN, small intracranial aneurysms with spontaneous dissection of the right internal carotid artery in 2002, COPD, ILD, HLD, GERD, BPH,  Clinical Impression  On eval, pt was Supv level for mobility. He walked ~200 feet around the unit. O2 dropped to 80% on RA. Once back in room, placed pt back on 2L [place] O2. Sats recovered to >90 % EOS. Made RN aware. Will plan to follow pt acutely.         If plan is discharge home, recommend the following: A little help with walking and/or transfers;A little help with bathing/dressing/bathroom;Assistance with cooking/housework;Assist for transportation;Help with stairs or ramp for entrance   Can travel by private vehicle        Equipment Recommendations None recommended by PT  Recommendations for Other Services       Functional Status Assessment Patient has had a recent decline in their functional status and demonstrates the ability to make significant improvements in function in a reasonable and predictable amount of time.     Precautions / Restrictions Precautions Precautions: Fall Precaution/Restrictions Comments: monitor O2 Restrictions Weight Bearing Restrictions Per Provider Order: No      Mobility  Bed Mobility               General bed mobility comments: oob with wife    Transfers Overall transfer level: Needs assistance   Transfers: Sit to/from Stand Sit to Stand: Supervision           General transfer comment: increased time.    Ambulation/Gait Ambulation/Gait assistance: Supervision Gait Distance (Feet): 200 Feet Assistive device: Straight  cane Gait Pattern/deviations: Step-through pattern, Decreased stride length       General Gait Details: O2 80% on RA. Dyspnea 2/4. Pt reported some lightheadedness.  Stairs            Wheelchair Mobility     Tilt Bed    Modified Rankin (Stroke Patients Only)       Balance Overall balance assessment: Mild deficits observed, not formally tested                                           Pertinent Vitals/Pain Pain Assessment Pain Assessment: No/denies pain    Home Living Family/patient expects to be discharged to:: Private residence Living Arrangements: Spouse/significant other Available Help at Discharge: Family;Available PRN/intermittently Type of Home: House Home Access: Stairs to enter Entrance Stairs-Rails: None Entrance Stairs-Number of Steps: 2   Home Layout: One level Home Equipment: Cane - single point      Prior Function Prior Level of Function : Independent/Modified Independent             Mobility Comments: uses cane for ambulation       Extremity/Trunk Assessment   Upper Extremity Assessment Upper Extremity Assessment: Defer to OT evaluation    Lower Extremity Assessment Lower Extremity Assessment: Generalized weakness    Cervical / Trunk Assessment Cervical / Trunk Assessment: Normal  Communication   Communication Communication: Impaired Factors Affecting Communication: Hearing impaired    Cognition Arousal: Alert Behavior During Therapy: [facility] for  tasks assessed/performed   PT - Cognitive impairments: No apparent impairments                         Following commands: Intact       Cueing Cueing Techniques: Verbal cues     General Comments      Exercises     Assessment/Plan    PT Assessment Patient needs continued PT services  PT Problem List Decreased activity tolerance;Decreased mobility;Decreased balance       PT Treatment Interventions Gait training;DME instruction;Therapeutic  activities;Therapeutic exercise;Functional mobility training;Patient/family education    PT Goals (Current goals can be found in the Care Plan section)  Acute Rehab PT Goals Patient Stated Goal: home soon PT Goal Formulation: With patient/family Time For Goal Achievement: 04/06/24 Potential to Achieve Goals: Good    Frequency Min 2X/week     Co-evaluation               AM-PAC PT "6 Clicks" Mobility  Outcome Measure Help needed turning from your back to your side while in a flat bed without using bedrails?: None Help needed moving from lying on your back to sitting on the side of a flat bed without using bedrails?: None Help needed moving to and from a bed to a chair (including a wheelchair)?: A Little Help needed standing up from a chair using your arms (e.g., wheelchair or bedside chair)?: A Little Help needed to walk in hospital room?: A Little Help needed climbing 3-5 steps with a railing? : A Little 6 Click Score: 20    End of Session Equipment Utilized During Treatment: Gait belt Activity Tolerance: Patient tolerated treatment well Patient left: in chair;with call bell/phone within reach;with family/visitor present   PT Visit Diagnosis: Difficulty in walking, not elsewhere classified (R26.2)    Time: 4696-2952 PT Time Calculation (min) (ACUTE ONLY): 18 min   Charges:   PT Evaluation $PT Eval Low Complexity: 1 Low   PT General Charges $$ ACUTE PT VISIT: 1 Visit           Tanda Falter, PT Acute Rehabilitation  Office: [phone]

## 2024-03-23 NOTE — TOC Initial Note (Signed)
 Transition of Care [facility]) - Initial/Assessment Note    Patient Details  Name: Maurice Wood MRN: [id number] Date of Birth: 1933-12-02  [facility]) CM/SW Contact:    Amaryllis Junior, LCSW Phone Number: 03/23/2024, 9:42 AM  Clinical Narrative:                 Pt from home with spouse. Pt continues medical workup. PT eval pending. TOC following for d/c needs.     Barriers to Discharge: Continued Medical Work up   Patient Goals and CMS Choice Patient states their goals for this hospitalization and ongoing recovery are:: return home w/ spouse CMS Medicare.gov Compare Post Acute Care list provided to::  (NA) Choice offered to / list presented to : NA [place] ownership interest in [facility].provided to::  (NA)    Expected Discharge Plan and Services In-house Referral: NA     Living arrangements for the past 2 months: Single Family Home                 DME Arranged: N/A DME Agency: NA       HH Arranged: NA HH Agency: NA        Prior Living Arrangements/Services Living arrangements for the past 2 months: Single Family Home Lives with:: Spouse Patient language and need for interpreter reviewed:: Yes Do you feel safe going back to the place where you live?: Yes      Need for Family Participation in Patient Care: Yes (Comment) Care giver support system in place?: Yes (comment)   Criminal Activity/Legal Involvement Pertinent to Current Situation/Hospitalization: No - Comment as needed  Activities of Daily Living   ADL Screening (condition at time of admission) Independently performs ADLs?: Yes (appropriate for developmental age) Is the patient deaf or have difficulty hearing?: Yes Does the patient have difficulty seeing, even when wearing glasses/contacts?: Yes Does the patient have difficulty concentrating, remembering, or making decisions?: No  Permission Sought/Granted                  Emotional Assessment Appearance:: Appears stated  age Attitude/Demeanor/Rapport: Engaged Affect (typically observed): Accepting Orientation: : Oriented to Self, Oriented to Place, Oriented to  Time, Oriented to Situation Alcohol / Substance Use: Not Applicable Psych Involvement: No (comment)  Admission diagnosis:  Hypoxia [R09.02] Acute on chronic congestive heart failure, unspecified heart failure type (HCC) [I50.9] Acute heart failure with preserved ejection fraction (HFpEF) (HCC) [I50.31] Acute exacerbation of CHF (congestive heart failure) (HCC) [I50.9] Patient Active Problem List   Diagnosis Date Noted   Elevated TSH 03/22/2024   Acute exacerbation of CHF (congestive heart failure) (HCC) 03/22/2024   Acute heart failure with preserved ejection fraction (HFpEF) (HCC) 03/21/2024   Acute respiratory failure with hypoxia (HCC) 03/21/2024   Prolonged QT interval 03/21/2024   Chorioretinitis 02/01/2021   Chronic pain 02/01/2021   Insomnia 02/01/2021   Interstitial lung disease (HCC) 02/01/2021   Nausea 02/01/2021   Overactive bladder 02/01/2021   Polyneuropathy 02/01/2021   Pulmonary nodule 02/01/2021   Seasonal allergic rhinitis 02/01/2021   Vitamin D deficiency 02/01/2021   COPD, moderate (HCC) 08/08/2020   Body mass index (BMI) 28.0-28.9, adult 02/04/2020   Elevated blood-pressure reading, without diagnosis of hypertension 02/04/2020   Body mass index (BMI) 27.0-27.9, adult 01/18/2020   Bilateral impacted cerumen 12/21/2019   Pain due to onychomycosis of toenails of both feet 05/26/2019   Coagulation disorder (HCC) 05/26/2019   Diabetic neuropathy (HCC) 05/26/2019   Pain in joint  of right shoulder 08/13/2018   Left ear pain 06/05/2016   Sensorineural hearing loss (SNHL) of both ears 06/05/2016   Pseudophakia of both eyes 04/03/2016   PVD (posterior vitreous detachment), both eyes 04/03/2016   Bilateral lower extremity edema 06/02/2015   Lower urinary tract infectious disease 05/25/2015   Sepsis (HCC) 05/25/2015   Fever  05/25/2015   Cough 05/25/2015   Kidney stone 05/25/2015   Hypokalemia 05/25/2015   Acute urinary retention    H/O cardiac catheterizationn 12/2009 with normal coronary arteries 05/30/2014   Cerebral arterial aneurysm 04/20/2014   Low back pain 12/17/2013   Chest pain 09/10/2013   Branch retinal vein occlusion 04/11/2012   Cystoid macular edema, left 04/11/2012   Epiretinal membrane, left 04/11/2012   Increased frequency of urination 10/08/2011   Urge incontinence 10/08/2011   Urinary incontinence 06/04/2011   Long term current use of anticoagulant 01/12/2011   HEMORRHOIDS-INTERNAL 06/27/2010   Sleep apnea 06/27/2010   DIVERTICULOSIS OF COLON 01/03/2010   GERD 11/22/2009   IRRITABLE BOWEL SYNDROME 11/22/2009   ERECTILE DYSFUNCTION 06/06/2009   BPH (benign prostatic hyperplasia) 06/18/2008   PSA, INCREASED 12/19/2007   OBSTRUCTIVE SLEEP APNEA 07/24/2007   DISSECTION, CAROTID ARTERY 07/24/2007   TESTOSTERONE DEFICIENCY 07/23/2007   HLD (hyperlipidemia) 07/23/2007   History of colonic polyps 07/23/2007   Atrial fibrillation, permanent (HCC) 07/14/2007   PCP:  Rae Bugler, MD Pharmacy:   [id number] [place], [place] - [address] [place] [address] Phone: [phone] Fax: [phone]  [facility] [address] [place] [address] Phone: [phone] Fax: [phone]     Social Drivers of Health (SDOH) Social History: SDOH Screenings   Food Insecurity: No Food Insecurity (03/22/2024)  Housing: Low Risk  (03/22/2024)  Transportation Needs: No Transportation Needs (03/22/2024)  Utilities: Not At Risk (03/22/2024)  Social Connections: Moderately Isolated (03/22/2024)  Tobacco Use: Medium Risk (03/22/2024)   SDOH Interventions:     Readmission Risk Interventions    03/23/2024    9:40 AM  Readmission Risk Prevention Plan  Transportation Screening Complete  PCP or Specialist Appt within  5-7 Days Complete  Home Care Screening Complete  Medication Review (RN CM) Complete

## 2024-03-23 NOTE — Progress Notes (Signed)
 PROGRESS NOTE    Maurice Wood  BJY:[id number] DOB: 06-25-1933 DOA: 03/21/2024 PCP: Tally Joe, MD    Brief Narrative:   89 year old with history of A-fib on Eliquis, OSA on CPAP, HTN, small intracranial aneurysm with dissection in the right ICA in 2002, COPD, ILD, HLD, GERD, BPH, CHF EF 65% admitted to the hospital for CHF exacerbation and started on IV Lasix.  Overall he is diuresing well.  Assessment & Plan:  Principal Problem:   Acute heart failure with preserved ejection fraction (HFpEF) (HCC) Active Problems:   HLD (hyperlipidemia)   OBSTRUCTIVE SLEEP APNEA   Atrial fibrillation, permanent (HCC)   GERD   BPH (benign prostatic hyperplasia)   COPD, moderate (HCC)   Chronic pain   Interstitial lung disease (HCC)   Acute respiratory failure with hypoxia (HCC)   Prolonged QT interval   Elevated TSH   Acute respiratory distress CHF exacerbation with preserved EF - Upon admission chest x-ray showed evidence of pulmonary vascular congestion with bilateral effusion, BNP was slightly elevated.  He was started on IV Lasix.  Fluid restriction.  Hopefully transition to p.o. tomorrow - Echocardiogram shows preserved EF - Replete electrolytes as appropriate - I-S/flutter valve  Prolonged QTc - Suspect secondary to A-fib and artifact.  Will repeat EKG  COPD with history of ILD - Bronchodilators.  Follows outpatient pulmonary  Chronic atrial fibrillation - On Eliquis  Obstructive sleep apnea - Nocturnal CPAP  GERD - PPI  Hyperlipidemia - Not on any statin  BPH - Proscar   DVT prophylaxis: SCDs Start: 03/21/24 2254 apixaban (ELIQUIS) tablet 5 mg      Code Status: Limited: Do not attempt resuscitation (DNR) -DNR-LIMITED -Do Not Intubate/DNI  Family Communication: Son is at bedside 1 more day of IV diuretics    Subjective: Seen at bedside, sitting up in recliner.  Off oxygen this morning.  Examination:  General exam: Appears calm and comfortable  Respiratory  system: Bibasilar crackles Cardiovascular system: S1 & S2 heard, RRR. No JVD, murmurs, rubs, gallops or clicks 2+ lower extremity edema Gastrointestinal system: Abdomen is nondistended, soft and nontender. No organomegaly or masses felt. Normal bowel sounds heard. Central nervous system: Alert and oriented. No focal neurological deficits. Extremities: Symmetric 5 x 5 power. Skin: No rashes, lesions or ulcers Psychiatry: Judgement and insight appear normal. Mood & affect appropriate.                Diet Orders (From admission, onward)     Start     Ordered   03/21/24 2305  Diet Heart Room service appropriate? Yes; Fluid consistency: Thin  Diet effective now       Question Answer Comment  Room service appropriate? Yes   Fluid consistency: Thin      03/21/24 2304            Objective: Vitals:   03/23/24 0200 03/23/24 0500 03/23/24 0518 03/23/24 0930  BP:   128/85 110/73  Pulse:   79 85  Resp: 17  18   Temp:   (!) 97.5 F (36.4 C) 98 F (36.7 C)  TempSrc:   Oral Oral  SpO2:   97%   Weight:  77.2 kg    Height:        Intake/Output Summary (Last 24 hours) at 03/23/2024 1101 Last data filed at 03/23/2024 0930 Gross per 24 hour  Intake 480 ml  Output 1350 ml  Net -870 ml   Filed Weights   03/22/24 0024 03/22/24 0501 03/23/24 0500  Weight: 77.4 kg 80.5 kg 77.2 kg    Scheduled Meds:  apixaban  5 mg Oral BID   budeson-glycopyrrolate-formoterol  2 puff Inhalation BID   furosemide  40 mg Intravenous Daily   pantoprazole  40 mg Oral Daily   sodium chloride flush  3 mL Intravenous Q12H   Continuous Infusions:  Nutritional status     Body mass index is 24.42 kg/m.  Data Reviewed:   CBC: Recent Labs  Lab 03/21/24 1740 03/22/24 0355  WBC 8.2 6.8  HGB 14.3 13.7  HCT 46.0 44.5  MCV 94.3 97.4  PLT 173 150   Basic Metabolic Panel: Recent Labs  Lab 03/21/24 1740 03/21/24 2244 03/22/24 0355 03/23/24 0333  NA 142 139 143 139  K 3.5 3.8 3.8 3.4*   CL 110 108 108 102  CO2 22 22 26 26   GLUCOSE 165* 194* 143* 141*  BUN 14 13 14 15   CREATININE 1.14 0.99 1.02 0.96  CALCIUM 9.6 9.1 9.2 8.7*  MG  --  1.8 1.9 1.9  PHOS  --  2.6 3.0  --    GFR: Estimated Creatinine Clearance: 54.9 mL/min (by C-G formula based on SCr of 0.96 mg/dL). Liver Function Tests: Recent Labs  Lab 03/21/24 2244 03/22/24 0355  AST 22 20  ALT 14 13  ALKPHOS 62 59  BILITOT 2.3* 2.5*  PROT 6.9 6.7  ALBUMIN 3.8 3.6   No results for input(s): "LIPASE", "AMYLASE" in the last 168 hours. No results for input(s): "AMMONIA" in the last 168 hours. Coagulation Profile: No results for input(s): "INR", "PROTIME" in the last 168 hours. Cardiac Enzymes: No results for input(s): "CKTOTAL", "CKMB", "CKMBINDEX", "TROPONINI" in the last 168 hours. BNP (last 3 results) No results for input(s): "PROBNP" in the last 8760 hours. HbA1C: Recent Labs    03/22/24 0355  HGBA1C 6.2*   CBG: Recent Labs  Lab 03/22/24 0758 03/22/24 1153 03/22/24 1706 03/22/24 2056 03/23/24 0738  GLUCAP 143* 124* 96 203* 125*   Lipid Profile: No results for input(s): "CHOL", "HDL", "LDLCALC", "TRIG", "CHOLHDL", "LDLDIRECT" in the last 72 hours. Thyroid Function Tests: Recent Labs    03/21/24 2244 03/22/24 0355  TSH 5.170*  --   FREET4  --  1.23*   Anemia Panel: No results for input(s): "VITAMINB12", "FOLATE", "FERRITIN", "TIBC", "IRON", "RETICCTPCT" in the last 72 hours. Sepsis Labs: Recent Labs  Lab 03/21/24 2244  PROCALCITON <0.10    Recent Results (from the past 240 hours)  Resp panel by RT-PCR (RSV, Flu A&B, Covid) Anterior Nasal Swab     Status: None   Collection Time: 03/21/24  5:57 PM   Specimen: Anterior Nasal Swab  Result Value Ref Range Status   SARS Coronavirus 2 by RT PCR NEGATIVE NEGATIVE Final    Comment: (NOTE) SARS-CoV-2 target nucleic acids are NOT DETECTED.  The SARS-CoV-2 RNA is generally detectable in upper respiratory specimens during the acute  phase of infection. The lowest concentration of SARS-CoV-2 viral copies this assay can detect is 138 copies/mL. A negative result does not preclude SARS-Cov-2 infection and should not be used as the sole basis for treatment or other patient management decisions. A negative result may occur with  improper specimen collection/handling, submission of specimen other than nasopharyngeal swab, presence of viral mutation(s) within the areas targeted by this assay, and inadequate number of viral copies(<138 copies/mL). A negative result must be combined with clinical observations, patient history, and epidemiological information. The expected result is Negative.  Fact Sheet for Patients:  BloggerCourse.com  Fact Sheet for Healthcare Providers:  SeriousBroker.it  This test is no t yet approved or cleared by the [place]  FDA and  has been authorized for detection and/or diagnosis of SARS-CoV-2 by FDA under an Emergency Use Authorization (EUA). This EUA will remain  in effect (meaning this test can be used) for the duration of the COVID-19 declaration under Section 564(b)(1) of the Act, 21 U.S.C.section 360bbb-3(b)(1), unless the authorization is terminated  or revoked sooner.       Influenza A by PCR NEGATIVE NEGATIVE Final   Influenza B by PCR NEGATIVE NEGATIVE Final    Comment: (NOTE) The Xpert Xpress SARS-CoV-2/FLU/RSV plus assay is intended as an aid in the diagnosis of influenza from Nasopharyngeal swab specimens and should not be used as a sole basis for treatment. Nasal washings and aspirates are unacceptable for Xpert Xpress SARS-CoV-2/FLU/RSV testing.  Fact Sheet for Patients: BloggerCourse.com  Fact Sheet for Healthcare Providers: SeriousBroker.it  This test is not yet approved or cleared by the [place]  FDA and has been authorized for detection and/or diagnosis of  SARS-CoV-2 by FDA under an Emergency Use Authorization (EUA). This EUA will remain in effect (meaning this test can be used) for the duration of the COVID-19 declaration under Section 564(b)(1) of the Act, 21 U.S.C. section 360bbb-3(b)(1), unless the authorization is terminated or revoked.     Resp Syncytial Virus by PCR NEGATIVE NEGATIVE Final    Comment: (NOTE) Fact Sheet for Patients: BloggerCourse.com  Fact Sheet for Healthcare Providers: SeriousBroker.it  This test is not yet approved or cleared by the [place]  FDA and has been authorized for detection and/or diagnosis of SARS-CoV-2 by FDA under an Emergency Use Authorization (EUA). This EUA will remain in effect (meaning this test can be used) for the duration of the COVID-19 declaration under Section 564(b)(1) of the Act, 21 U.S.C. section 360bbb-3(b)(1), unless the authorization is terminated or revoked.  Performed at [facility], [address] [place], [place] [address]          Radiology Studies: ECHOCARDIOGRAM COMPLETE Result Date: 03/22/2024    ECHOCARDIOGRAM REPORT   Patient Name:   Maurice Wood Date of Exam: 03/22/2024 Medical Rec #:  [id number]    Height:       70.0 in Accession #:    [id number]   Weight:       177.5 lb Date of Birth:  17-Aug-1933    BSA:          1.984 m Patient Age:    89 years     BP:           122/81 mmHg Patient Gender: M            HR:           80 bpm. Exam Location:  Inpatient Procedure: 2D Echo, Cardiac Doppler and Color Doppler (Both Spectral and Color            Flow Doppler were utilized during procedure). Indications:    CHF-acute diastolic  History:        Patient has prior history of Echocardiogram examinations, most                 recent 01/25/2023. COPD, Signs/Symptoms:Chest Pain; Risk                 Factors:Sleep Apnea, Dyslipidemia and Diabetes.  Sonographer:    Juanita Shaw Referring Phys: [id number] ANASTASSIA  DOUTOVA IMPRESSIONS  1. Left ventricular ejection fraction,  by estimation, is 60 to 65%. The left ventricle has normal function. The left ventricle has no regional wall motion abnormalities. Left ventricular diastolic function could not be evaluated.  2. Right ventricular systolic function is normal. The right ventricular size is normal. There is normal pulmonary artery systolic pressure. The estimated right ventricular systolic pressure is 29.2 mmHg.  3. Left atrial size was severely dilated.  4. Right atrial size was moderately dilated.  5. The mitral valve is normal in structure. Mild mitral valve regurgitation. No evidence of mitral stenosis. Moderate mitral annular calcification.  6. The aortic valve is tricuspid. There is mild calcification of the aortic valve. There is mild thickening of the aortic valve. Aortic valve regurgitation is not visualized. Aortic valve sclerosis is present, with no evidence of aortic valve stenosis.  7. Aortic dilatation noted. There is moderate dilatation of the aortic root, measuring 47 mm. Comparison(s): No significant change from prior study. Prior images reviewed side by side. FINDINGS  Left Ventricle: Left ventricular ejection fraction, by estimation, is 60 to 65%. The left ventricle has normal function. The left ventricle has no regional wall motion abnormalities. The left ventricular internal cavity size was normal in size. There is  no left ventricular hypertrophy. Left ventricular diastolic function could not be evaluated due to atrial fibrillation. Left ventricular diastolic function could not be evaluated. Right Ventricle: The right ventricular size is normal. Right vetricular wall thickness was not well visualized. Right ventricular systolic function is normal. There is normal pulmonary artery systolic pressure. The tricuspid regurgitant velocity is 2.46 m/s, and with an assumed right atrial pressure of 5 mmHg, the estimated right ventricular systolic pressure is 29.2  mmHg. Left Atrium: Left atrial size was severely dilated. Right Atrium: Right atrial size was moderately dilated. Pericardium: There is no evidence of pericardial effusion. Mitral Valve: The mitral valve is normal in structure. Moderate mitral annular calcification. Mild mitral valve regurgitation. No evidence of mitral valve stenosis. MV peak gradient, 6.0 mmHg. The mean mitral valve gradient is 2.0 mmHg. Tricuspid Valve: The tricuspid valve is normal in structure. Tricuspid valve regurgitation is trivial. Aortic Valve: The aortic valve is tricuspid. There is mild calcification of the aortic valve. There is mild thickening of the aortic valve. Aortic valve regurgitation is not visualized. Aortic valve sclerosis is present, with no evidence of aortic valve stenosis. Aortic valve mean gradient measures 3.3 mmHg. Aortic valve peak gradient measures 7.3 mmHg. Aortic valve area, by VTI measures 2.03 cm. Pulmonic Valve: The pulmonic valve was not well visualized. Pulmonic valve regurgitation is not visualized. Aorta: Aortic dilatation noted. There is moderate dilatation of the aortic root, measuring 47 mm. Venous: The inferior vena cava was not well visualized. IAS/Shunts: No atrial level shunt detected by color flow Doppler.  LEFT VENTRICLE PLAX 2D LVIDd:         4.70 cm     Diastology LVIDs:         2.60 cm     LV e' medial:    5.87 cm/s LV PW:         0.80 cm     LV E/e' medial:  18.1 LV IVS:        1.00 cm     LV e' lateral:   6.53 cm/s LVOT diam:     2.10 cm     LV E/e' lateral: 16.2 LV SV:         46 LV SV Index:   23 LVOT Area:  3.46 cm  LV Volumes (MOD) LV vol d, MOD A2C: 74.1 ml LV vol d, MOD A4C: 66.2 ml LV vol s, MOD A2C: 28.0 ml LV vol s, MOD A4C: 17.9 ml LV SV MOD A2C:     46.1 ml LV SV MOD A4C:     66.2 ml LV SV MOD BP:      46.5 ml RIGHT VENTRICLE RV Basal diam:  3.80 cm RV Mid diam:    2.40 cm RV S prime:     8.59 cm/s TAPSE (M-mode): 3.3 cm LEFT ATRIUM             Index        RIGHT ATRIUM            Index LA diam:        4.10 cm 2.07 cm/m   RA Area:     24.50 cm LA Vol (A2C):   86.6 ml 43.65 ml/m  RA Volume:   74.80 ml  37.70 ml/m LA Vol (A4C):   81.3 ml 40.98 ml/m LA Biplane Vol: 89.7 ml 45.21 ml/m  AORTIC VALVE                    PULMONIC VALVE AV Area (Vmax):    1.73 cm     PV Vmax:       0.71 m/s AV Area (Vmean):   1.81 cm     PV Peak grad:  2.0 mmHg AV Area (VTI):     2.03 cm AV Vmax:           134.67 cm/s AV Vmean:          82.900 cm/s AV VTI:            0.228 m AV Peak Grad:      7.3 mmHg AV Mean Grad:      3.3 mmHg LVOT Vmax:         67.15 cm/s LVOT Vmean:        43.250 cm/s LVOT VTI:          0.134 m LVOT/AV VTI ratio: 0.58  AORTA Ao Root diam: 4.70 cm Ao Asc diam:  3.30 cm MITRAL VALVE                TRICUSPID VALVE MV Area (PHT): 2.78 cm     TR Peak grad:   24.2 mmHg MV Area VTI:   1.63 cm     TR Vmax:        246.00 cm/s MV Peak grad:  6.0 mmHg MV Mean grad:  2.0 mmHg     SHUNTS MV Vmax:       1.22 m/s     Systemic VTI:  0.13 m MV Vmean:      60.5 cm/s    Systemic Diam: 2.10 cm MV Decel Time: 273 msec MV E velocity: 106.00 cm/s Rachelle Hora Croitoru MD Electronically signed by Thurmon Fair MD Signature Date/Time: 03/22/2024/1:39:42 PM    Final    DG Chest Port 1 View Result Date: 03/21/2024 CLINICAL DATA:  Chest pain with shortness of breath. EXAM: PORTABLE CHEST 1 VIEW COMPARISON:  06/09/2023. FINDINGS: Heart is enlarged and the mediastinal contour is stable. The pulmonary vasculature is distended. Interstitial prominence is noted bilaterally with mild airspace disease at the lung bases. There are small bilateral pleural effusions. No pneumothorax is seen. No acute osseous abnormality. IMPRESSION: 1. Cardiomegaly with pulmonary vascular congestion. 2. Interstitial prominence bilaterally with airspace disease at the lung bases, possible  edema or infiltrate. 3. Small bilateral pleural effusions. Electronically Signed   By: Wyvonnia Heimlich M.D.   On: 03/21/2024 18:41           LOS: 1 day    Time spent= 35 mins    Maggie Schooner, MD [facility]  If 7PM-7AM, please contact night-coverage  03/23/2024, 11:01 AM

## 2024-03-23 NOTE — Progress Notes (Signed)
   03/23/24 2345  BiPAP/CPAP/SIPAP  BiPAP/CPAP/SIPAP Pt Type Adult  BiPAP/CPAP/SIPAP Resmed  Mask Type Full face mask  Dentures removed? Not applicable  Mask Size Medium  Flow Rate 2 lpm  Patient Home Machine No  Patient Home Mask No  Patient Home Tubing No  Auto Titrate Yes  Minimum cmH2O 5 cmH2O  Maximum cmH2O 12 cmH2O  Device Plugged into RED Power Outlet Yes

## 2024-03-23 NOTE — Plan of Care (Signed)
  Problem: Education: Goal: Knowledge of General Education information will improve Description: Including pain rating scale, medication(s)/side effects and non-pharmacologic comfort measures Outcome: Progressing   Problem: Health Behavior/Discharge Planning: Goal: Ability to manage health-related needs will improve Outcome: Progressing   Problem: Nutrition: Goal: Adequate nutrition will be maintained Outcome: Progressing   Problem: Coping: Goal: Level of anxiety will decrease Outcome: Progressing   Problem: Activity: Goal: Risk for activity intolerance will decrease Outcome: Progressing

## 2024-03-23 NOTE — Progress Notes (Signed)
   03/22/24 2305  BiPAP/CPAP/SIPAP  BiPAP/CPAP/SIPAP Pt Type Adult  BiPAP/CPAP/SIPAP Resmed  Mask Type Full face mask  Dentures removed? Not applicable  Mask Size Medium  PEEP 12 cmH20  Flow Rate 2 lpm  Patient Home Machine No  Patient Home Mask No  Patient Home Tubing No  Auto Titrate Yes  Minimum cmH2O 5 cmH2O  Maximum cmH2O 12 cmH2O  Device Plugged into RED Power Outlet Yes  BiPAP/CPAP /SiPAP Vitals  Pulse Rate 83  Resp 18  Bilateral Breath Sounds Diminished  MEWS Score/Color  MEWS Score 0  MEWS Score Color Marrie Sizer

## 2024-03-23 NOTE — Consult Note (Signed)
 WOC Nurse Consult Note: Reason for Consult: R forearm skin tear  Wound type: full thickness r/t trauma  Pressure Injury POA: NA  Measurement: see nursing flowsheet  Wound bed: red moist  Drainage (amount, consistency, odor) serosanguinous  Periwound: ecchymosis  Dressing procedure/placement/frequency: Cleanse R arm skin tear with NS, apply Vaseline gauze (Lawson #[id number]) to wound bed every other day, cover with Telfa non stick dressing and Kerlix roll gauze. SOAK OLD DRESSING OFF WITH NORMAL SALINE IF STUCK TO WOUND BED FOR ATRAUMATIC REMOVAL.   POC discussed with bedside nurse. WOC team will not follow. Re-consult if further needs arise.   Thank you,    Ronni Colace MSN, RN-BC, [facility] [phone]

## 2024-03-23 NOTE — Progress Notes (Signed)
 Physical Therapy Treatment Patient Details Name: Maurice Wood MRN: [id number] DOB: 10-Sep-1933 Today's Date: 03/23/2024   History of Present Illness Patient is a 89 year old male who presented on 4/12 with shortness of breath. Patient was admitted with acute heart failure with preserved ejection fraction and acute respiratory failure with hypoxia. WGN:FAOZHY fibrillation,OSA, HTN, small intracranial aneurysms with spontaneous dissection of the right internal carotid artery in 2002, COPD, ILD, HLD, GERD, BPH,    PT Comments  Wife asking for pt to ambulate again on today. Pt agreeable. O2: 94% on 2L at rest, 83% on 2L with ambulation. Pt continues to report intermittent dizziness. PT recommendation has been updated to HHPT f/u, if pt/wife are agreeable. Will continue to follow pt and progress activity as tolerated.     If plan is discharge home, recommend the following: A little help with walking and/or transfers;A little help with bathing/dressing/bathroom;Assistance with cooking/housework;Assist for transportation;Help with stairs or ramp for entrance   Can travel by private vehicle        Equipment Recommendations  None recommended by PT    Recommendations for Other Services       Precautions / Restrictions Precautions Precautions: Fall Precaution/Restrictions Comments: monitor O2 Restrictions Weight Bearing Restrictions Per Provider Order: No     Mobility  Bed Mobility               General bed mobility comments: oob in recliner    Transfers Overall transfer level: Needs assistance Equipment used: Straight cane Transfers: Sit to/from Stand Sit to Stand: Supervision           General transfer comment: increased time. cues for safety    Ambulation/Gait Ambulation/Gait assistance: Contact guard assist Gait Distance (Feet): 200 Feet Assistive device: Straight cane Gait Pattern/deviations: Step-through pattern, Decreased stride length       General Gait  Details: O2 83% on 2L. Dyspnea 2/4. Pt reported some lightheadedness.   Stairs             Wheelchair Mobility     Tilt Bed    Modified Rankin (Stroke Patients Only)       Balance Overall balance assessment: Mild deficits observed, not formally tested                                          Communication Communication Communication: No apparent difficulties Factors Affecting Communication: Hearing impaired  Cognition Arousal: Alert Behavior During Therapy: WFL for tasks assessed/performed   PT - Cognitive impairments: No apparent impairments                         Following commands: Intact      Cueing Cueing Techniques: Verbal cues  Exercises      General Comments        Pertinent Vitals/Pain Pain Assessment Pain Assessment: No/denies pain    Home Living Family/patient expects to be discharged to:: Private residence Living Arrangements: Spouse/significant other Available Help at Discharge: Family;Available PRN/intermittently Type of Home: House Home Access: Stairs to enter Entrance Stairs-Rails: None Entrance Stairs-Number of Steps: 2   Home Layout: One level Home Equipment: Cane - single point      Prior Function            PT Goals (current goals can now be found in the care plan section) Acute Rehab PT Goals Patient Stated Goal:  home soon PT Goal Formulation: With patient/family Time For Goal Achievement: 04/06/24 Potential to Achieve Goals: Good Progress towards PT goals: Progressing toward goals    Frequency    Min 2X/week      PT Plan      Co-evaluation              AM-PAC PT "6 Clicks" Mobility   Outcome Measure  Help needed turning from your back to your side while in a flat bed without using bedrails?: None Help needed moving from lying on your back to sitting on the side of a flat bed without using bedrails?: None Help needed moving to and from a bed to a chair (including a  wheelchair)?: A Little Help needed standing up from a chair using your arms (e.g., wheelchair or bedside chair)?: A Little Help needed to walk in hospital room?: A Little Help needed climbing 3-5 steps with a railing? : A Little 6 Click Score: 20    End of Session Equipment Utilized During Treatment: Gait belt;Oxygen Activity Tolerance: Patient tolerated treatment well Patient left: in chair;with call bell/phone within reach;with family/visitor present   PT Visit Diagnosis: Difficulty in walking, not elsewhere classified (R26.2)     Time: 1610-9604 PT Time Calculation (min) (ACUTE ONLY): 18 min  Charges:    $Gait Training: 8-22 mins PT General Charges $$ ACUTE PT VISIT: 1 Visit                        Tanda Falter, PT Acute Rehabilitation  Office: [phone]

## 2024-03-24 DIAGNOSIS — I5031 Acute diastolic (congestive) heart failure: Secondary | ICD-10-CM | POA: Diagnosis not present

## 2024-03-24 LAB — BASIC METABOLIC PANEL WITH GFR
Anion gap: 10 (ref 5–15)
BUN: 21 mg/dL (ref 8–23)
CO2: 24 mmol/L (ref 22–32)
Calcium: 8.8 mg/dL — ABNORMAL LOW (ref 8.9–10.3)
Chloride: 106 mmol/L (ref 98–111)
Creatinine, Ser: 1.13 mg/dL (ref 0.61–1.24)
GFR, Estimated: >60 mL/min (ref >60)
Glucose, Bld: 156 mg/dL — ABNORMAL HIGH (ref 70–99)
Potassium: 3.5 mmol/L (ref 3.5–5.1)
Sodium: 140 mmol/L (ref 135–145)

## 2024-03-24 LAB — MAGNESIUM: Magnesium: 1.8 mg/dL (ref 1.7–2.4)

## 2024-03-24 LAB — GLUCOSE, CAPILLARY: Glucose-Capillary: 127 mg/dL — ABNORMAL HIGH (ref 70–99)

## 2024-03-24 MED ORDER — POTASSIUM CHLORIDE 20 MEQ PO PACK
60.0000 meq | PACK | Freq: Once | ORAL | Status: AC
  Administered 2024-03-24: 60 meq via ORAL
  Filled 2024-03-24: qty 3

## 2024-03-24 MED ORDER — FUROSEMIDE 40 MG PO TABS: 40.0000 mg | ORAL_TABLET | Freq: Every day | ORAL | 0 refills | Status: DC

## 2024-03-24 MED ORDER — MAGNESIUM OXIDE -MG SUPPLEMENT 400 (240 MG) MG PO TABS
800.0000 mg | ORAL_TABLET | Freq: Once | ORAL | Status: AC
  Administered 2024-03-24: 800 mg via ORAL
  Filled 2024-03-24: qty 2

## 2024-03-24 MED ORDER — FUROSEMIDE 40 MG PO TABS
40.0000 mg | ORAL_TABLET | Freq: Every day | ORAL | Status: DC
  Administered 2024-03-24: 40 mg via ORAL
  Filled 2024-03-24: qty 1

## 2024-03-24 MED ORDER — POTASSIUM CHLORIDE CRYS ER 20 MEQ PO TBCR: 20.0000 meq | EXTENDED_RELEASE_TABLET | Freq: Every day | ORAL | 0 refills | Status: DC

## 2024-03-24 NOTE — TOC Transition Note (Signed)
 Transition of Care [facility]) - Discharge Note   Patient Details  Name: Maurice Wood MRN: [id number] Date of Birth: [birth year]  Transition of Care [facility]) CM/SW Contact:  Delilah Fend, LCSW Phone Number: 03/24/2024, 10:59 AM   Clinical Narrative:     Met with pt and wife to review dc needs/ recommendations for home O2 and HHPT.  Agreeable to both and no agency preferences.  O2 ordered via [employer] and portable tank to be delivered to room prior to dc.  HHPT set up with [facility] and contact info placed on AVS.  No further TOC needs.  Final next level of care: Home w Home Health Services Barriers to Discharge: Barriers Resolved   Patient Goals and CMS Choice Patient states their goals for this hospitalization and ongoing recovery are:: return home w/ spouse CMS Medicare.gov Compare Post Acute Care list provided to::  (NA) Choice offered to / list presented to : NA [place] ownership interest in [facility].provided to::  (NA)    Discharge Placement                       Discharge Plan and Services Additional resources added to the After Visit Summary for   In-house Referral: NA              DME Arranged: Oxygen DME Agency: [employer] Date DME Agency Contacted: 03/24/24 Time DME Agency Contacted: [id number] Representative spoke with at DME Agency: Gladys Lamp HH Arranged: PT HH Agency: [facility] Date [facility] Agency Contacted: 03/24/24   Representative spoke with at [facility] Agency: Randel Buss  Social Drivers of Health (SDOH) Interventions SDOH Screenings   Food Insecurity: No Food Insecurity (03/22/2024)  Housing: Low Risk  (03/22/2024)  Transportation Needs: No Transportation Needs (03/22/2024)  Utilities: Not At Risk (03/22/2024)  Social Connections: Moderately Isolated (03/22/2024)  Tobacco Use: Medium Risk (03/22/2024)     Readmission Risk Interventions    03/23/2024    9:40 AM  Readmission Risk Prevention Plan  Transportation Screening Complete  PCP or  Specialist Appt within 5-7 Days Complete  Home Care Screening Complete  Medication Review (RN CM) Complete

## 2024-03-24 NOTE — Plan of Care (Signed)

## 2024-03-24 NOTE — Progress Notes (Signed)
 Occupational Therapy Treatment Patient Details Name: Maurice Wood MRN: [id number] DOB: 02-Nov-1933 Today's Date: 03/24/2024   History of present illness Patient is a 89 year old male who presented on 4/12 with shortness of breath. Patient was admitted with acute heart failure with preserved ejection fraction and acute respiratory failure with hypoxia. WUJ:WJXBJY fibrillation,OSA, HTN, small intracranial aneurysms with spontaneous dissection of the right internal carotid artery in 2002, COPD, ILD, HLD, GERD, BPH,   OT comments  Patient and wife were educated on importance of patient engagement in ADLs and doing tasks for himself that he is able to complete for himself. Patient and wife were educated on O2 use and importance to engagement in O2 use and cord management during ADLs. Patient and wife demonstrating some understanding during session. Patient was min A for LB Dressing and donning shirt with patient's wife needing cues to let patient do task and patient to engage in task. Patient's discharge plan remains appropriate at this time. OT will continue to follow acutely.        If plan is discharge home, recommend the following:  A little help with walking and/or transfers;A little help with bathing/dressing/bathroom;Assistance with cooking/housework;Direct supervision/assist for medications management;Assist for transportation;Help with stairs or ramp for entrance;Direct supervision/assist for financial management   Equipment Recommendations  None recommended by OT       Precautions / Restrictions Precautions Precautions: Fall Precaution/Restrictions Comments: monitor O2 Restrictions Weight Bearing Restrictions Per Provider Order: No       Mobility Bed Mobility   Bed Mobility: Supine to Sit     Supine to sit: Supervision                      ADL either performed or assessed with clinical judgement   ADL Overall ADL's : Needs assistance/impaired     Toilet Transfer:  Contact guard assist;Ambulation Toilet Transfer Details (indicate cue type and reason): personal cane. patient indicated wanting to use cane. patient noted to reach out to hold furniture and doorway on opposite side. patients wife present and providing CGA at this time. Toileting- Clothing Manipulation and Hygiene: Maximal assistance Toileting - Clothing Manipulation Details (indicate cue type and reason): from wife, patient making no moves to complete for himself. patient and wife educated on participation in tasks. patient and wife verbalied understanding but wife still helping patient and patient making no efforts to complete task. wife even endorses that he was doing this for himself at home prior level.       General ADL Comments: patient very talkative during session about time in [employer] and other stories during session needed cues from wife to attend to task.      Cognition Arousal: Alert Behavior During Therapy: WFL for tasks assessed/performed Cognition: No apparent impairments             OT - Cognition Comments: wife present as well.                                        Pertinent Vitals/ Pain       Pain Assessment Pain Assessment: No/denies pain         Frequency  Min 2X/week        Progress Toward Goals  OT Goals(current goals can now be found in the care plan section)  Progress towards OT goals: Progressing toward goals     Plan  AM-PAC OT "6 Clicks" Daily Activity     Outcome Measure   Help from another person eating meals?: A Little Help from another person taking care of personal grooming?: A Little Help from another person toileting, which includes using toliet, bedpan, or urinal?: A Lot Help from another person bathing (including washing, rinsing, drying)?: A Lot Help from another person to put on and taking off regular upper body clothing?: A Little Help from another person to put on and taking off regular lower body  clothing?: A Lot 6 Click Score: 15    End of Session Equipment Utilized During Treatment: Oxygen;Gait belt;Other (comment) (personal cane)  OT Visit Diagnosis: Unsteadiness on feet (R26.81);Other abnormalities of gait and mobility (R26.89)   Activity Tolerance Patient limited by fatigue   Patient Left in bed;with call bell/phone within reach   Nurse Communication Mobility status        Time: 0920-0952 OT Time Calculation (min): 32 min  Charges: OT General Charges $OT Visit: 1 Visit OT Treatments $Self Care/Home Management : 23-37 mins  Wynette Heckler, MS Acute Rehabilitation Department Office# [phone]   Jame Maze 03/24/2024, 9:55 AM

## 2024-03-24 NOTE — Discharge Summary (Signed)
 Physician Discharge Summary  Joshuajames Moehring Perham ZOX:[id number] DOB: 1933/09/07 DOA: 03/21/2024  PCP: Tally Joe, MD  Admit date: 03/21/2024 Discharge date: 03/24/2024  Admitted From: Home Disposition:  Home  Recommendations for Outpatient Follow-up:  Follow up with PCP in 1-2 weeks Please obtain BMP/CBC in one week your next doctors visit.  Lasix 40 mg daily along with potassium supplements 2-3 L nasal cannula has been ordered for him to use at home   Discharge Condition: Stable CODE STATUS: DNR Diet recommendation: Heart healthy  Brief/Interim Summary: Brief Narrative:   89 year old with history of A-fib on Eliquis, OSA on CPAP, HTN, small intracranial aneurysm with dissection in the right ICA in 2002, COPD, ILD, HLD, GERD, BPH, CHF EF 65% admitted to the hospital for CHF exacerbation and started on IV Lasix.  Overall he is diuresing well. Patient responded well to IV diuresis.  Today appears to be euvolemic.  Overall feels much better.  Does have some desaturation with ambulation therefore we will arrange for home oxygen.  Otherwise is medically stable for discharge.  Assessment & Plan:  Principal Problem:   Acute heart failure with preserved ejection fraction (HFpEF) (HCC) Active Problems:   HLD (hyperlipidemia)   OBSTRUCTIVE SLEEP APNEA   Atrial fibrillation, permanent (HCC)   GERD   BPH (benign prostatic hyperplasia)   COPD, moderate (HCC)   Chronic pain   Interstitial lung disease (HCC)   Acute respiratory failure with hypoxia (HCC)   Prolonged QT interval   Elevated TSH   Acute respiratory distress CHF exacerbation with preserved EF - Upon admission chest x-ray showed evidence of pulmonary vascular congestion with bilateral effusion, patient responded very well to IV Lasix.  Lungs are clear to auscultation bilaterally, lower extremity swelling is essentially resolved.  Spouse is present at bedside.  Will discharge him on daily Lasix 40 mg along with potassium supplements.   He will need to have repeat blood work with his PCP in about 1 week. - Echocardiogram shows preserved EF - Replete electrolytes as appropriate - I-S/flutter valve  Prolonged QTc - Suspect secondary to A-fib and artifact.  Follow-up outpatient  COPD with history of ILD - Bronchodilators.  Follows outpatient pulmonary  Chronic atrial fibrillation - On Eliquis  Obstructive sleep apnea - Nocturnal CPAP  GERD - PPI  Hyperlipidemia - Not on any statin  BPH - Proscar   DVT prophylaxis: SCDs Start: 03/21/24 2254 apixaban (ELIQUIS) tablet 5 mg      Code Status: Limited: Do not attempt resuscitation (DNR) -DNR-LIMITED -Do Not Intubate/DNI  Family Communication: Spouse is at bedside Discharge today    Subjective: Sitting up in the bed, swelling has resolved.  No complaints.  Wishing to go home  Examination:  General exam: Appears calm and comfortable  Respiratory system: Bibasilar crackles, very minimal Cardiovascular system: S1 & S2 heard, RRR. No JVD, murmurs, rubs, gallops or clicks no lower extremity swelling Gastrointestinal system: Abdomen is nondistended, soft and nontender. No organomegaly or masses felt. Normal bowel sounds heard. Central nervous system: Alert and oriented. No focal neurological deficits. Extremities: Symmetric 5 x 5 power. Skin: No rashes, lesions or ulcers Psychiatry: Judgement and insight appear normal. Mood & affect appropriate.    Discharge Diagnoses:  Principal Problem:   Acute heart failure with preserved ejection fraction (HFpEF) (HCC) Active Problems:   HLD (hyperlipidemia)   OBSTRUCTIVE SLEEP APNEA   Atrial fibrillation, permanent (HCC)   GERD   BPH (benign prostatic hyperplasia)   COPD, moderate (HCC)   Chronic pain  Interstitial lung disease (HCC)   Acute respiratory failure with hypoxia (HCC)   Prolonged QT interval   Elevated TSH   Acute exacerbation of CHF (congestive heart failure) (HCC)      Discharge  Exam: Vitals:   03/23/24 2044 03/24/24 0524  BP: 117/64 122/77  Pulse: 94 73  Resp:  16  Temp: 97.8 F (36.6 C) 98.1 F (36.7 C)  SpO2: 92% 95%   Vitals:   03/23/24 1245 03/23/24 2044 03/24/24 0500 03/24/24 0524  BP: 120/76 117/64  122/77  Pulse: 70 94  73  Resp:    16  Temp:  97.8 F (36.6 C)  98.1 F (36.7 C)  TempSrc:  Oral  Oral  SpO2:  92%  95%  Weight:   76.9 kg   Height:          Discharge Instructions   Allergies as of 03/24/2024       Reactions   Nsaids Other (See Comments)   NOT WHILE TAKING ELIQUIS   Iodinated Contrast Media Other (See Comments)   Pt states "turning bright red"   Pneumococcal Vaccines Hives, Rash        Medication List     TAKE these medications    albuterol 108 (90 Base) MCG/ACT inhaler Commonly known as: VENTOLIN HFA Inhale 2 puffs into the lungs every 6 (six) hours as needed for wheezing or shortness of breath.   cholecalciferol 25 MCG (1000 UNIT) tablet Commonly known as: VITAMIN D3 Take 1,000 Units by mouth daily.   cyanocobalamin 1000 MCG tablet Commonly known as: VITAMIN B12 Take 1,000 mcg by mouth daily.   Eliquis 5 MG Tabs tablet Generic drug: apixaban TAKE 1 TABLET BY MOUTH TWICE A DAY   furosemide 40 MG tablet Commonly known as: LASIX Take 1 tablet (40 mg total) by mouth daily. What changed:  when to take this reasons to take this   multivitamin with minerals Tabs tablet Take 1 tablet by mouth daily.   nitroGLYCERIN 0.4 MG SL tablet Commonly known as: NITROSTAT Place 1 tablet (0.4 mg total) under the tongue every 5 (five) minutes as needed for chest pain.   omeprazole 20 MG capsule Commonly known as: PRILOSEC Take 1 capsule (20 mg total) by mouth daily.   potassium chloride SA 20 MEQ tablet Commonly known as: KLOR-CON M Take 1 tablet (20 mEq total) by mouth daily.   Trelegy Ellipta 100-62.5-25 MCG/ACT Aepb Generic drug: Fluticasone-Umeclidin-Vilant INHALE 1 PUFF INTO THE LUNGS DAILY IN THE  AFTERNOON What changed: See the new instructions.               Durable Medical Equipment  (From admission, onward)           Start     Ordered   03/24/24 0919  For home use only DME oxygen  Once       Question Answer Comment  Length of Need Lifetime   Mode or (Route) Nasal cannula   Liters per Minute 2   Frequency Continuous (stationary and portable oxygen unit needed)   Oxygen delivery system Gas      03/24/24 0918            Follow-up Information     Rae Bugler, MD Follow up in 1 week(s).   Specialty: Family Medicine Contact information: [phone] [place] [address] [phone]         [facility] Follow up.   Specialty: Home Health Services Why: to provide  home physical therapy visits Contact information: [address] [place] [address] [phone]                Allergies  Allergen Reactions   Nsaids Other (See Comments)    NOT WHILE TAKING ELIQUIS    Iodinated Contrast Media Other (See Comments)    Pt states "turning bright red"   Pneumococcal Vaccines Hives and Rash    You were cared for by a hospitalist during your hospital stay. If you have any questions about your discharge medications or the care you received while you were in the hospital after you are discharged, you can call the unit and asked to speak with the hospitalist on call if the hospitalist that took care of you is not available. Once you are discharged, your primary care physician will handle any further medical issues. Please note that no refills for any discharge medications will be authorized once you are discharged, as it is imperative that you return to your primary care physician (or establish a relationship with a primary care physician if you do not have one) for your aftercare needs so that they can reassess your need for medications and monitor your lab values.  You were cared for by a hospitalist during  your hospital stay. If you have any questions about your discharge medications or the care you received while you were in the hospital after you are discharged, you can call the unit and asked to speak with the hospitalist on call if the hospitalist that took care of you is not available. Once you are discharged, your primary care physician will handle any further medical issues. Please note that NO REFILLS for any discharge medications will be authorized once you are discharged, as it is imperative that you return to your primary care physician (or establish a relationship with a primary care physician if you do not have one) for your aftercare needs so that they can reassess your need for medications and monitor your lab values.  Please request your Prim.MD to go over all Hospital Tests and Procedure/Radiological results at the follow up, please get all Hospital records sent to your Prim MD by signing hospital release before you go home.  Get CBC, CMP, 2 view Chest X ray checked  by Primary MD during your next visit or SNF MD in 5-7 days ( we routinely change or add medications that can affect your baseline labs and fluid status, therefore we recommend that you get the mentioned basic workup next visit with your PCP, your PCP may decide not to get them or add new tests based on their clinical decision)  On your next visit with your primary care physician please Get Medicines reviewed and adjusted.  If you experience worsening of your admission symptoms, develop shortness of breath, life threatening emergency, suicidal or homicidal thoughts you must seek medical attention immediately by calling 911 or calling your MD immediately  if symptoms less severe.  You Must read complete instructions/literature along with all the possible adverse reactions/side effects for all the Medicines you take and that have been prescribed to you. Take any new Medicines after you have completely understood and accpet all the  possible adverse reactions/side effects.   Do not drive, operate heavy machinery, perform activities at heights, swimming or participation in water activities or provide baby sitting services if your were admitted for syncope or siezures until you have seen by Primary MD or a Neurologist and advised to do so  again.  Do not drive when taking Pain medications.   Procedures/Studies: ECHOCARDIOGRAM COMPLETE Result Date: 03/22/2024    ECHOCARDIOGRAM REPORT   Patient Name:   Felder Zachery Hermes Tippens Date of Exam: 03/22/2024 Medical Rec #:  [id number]    Height:       70.0 in Accession #:    [id number]   Weight:       177.5 lb Date of Birth:  30-May-1933    BSA:          1.984 m Patient Age:    89 years     BP:           122/81 mmHg Patient Gender: M            HR:           80 bpm. Exam Location:  Inpatient Procedure: 2D Echo, Cardiac Doppler and Color Doppler (Both Spectral and Color            Flow Doppler were utilized during procedure). Indications:    CHF-acute diastolic  History:        Patient has prior history of Echocardiogram examinations, most                 recent 01/25/2023. COPD, Signs/Symptoms:Chest Pain; Risk                 Factors:Sleep Apnea, Dyslipidemia and Diabetes.  Sonographer:    Juanita Shaw Referring Phys: [id number] ANASTASSIA DOUTOVA IMPRESSIONS  1. Left ventricular ejection fraction, by estimation, is 60 to 65%. The left ventricle has normal function. The left ventricle has no regional wall motion abnormalities. Left ventricular diastolic function could not be evaluated.  2. Right ventricular systolic function is normal. The right ventricular size is normal. There is normal pulmonary artery systolic pressure. The estimated right ventricular systolic pressure is 29.2 mmHg.  3. Left atrial size was severely dilated.  4. Right atrial size was moderately dilated.  5. The mitral valve is normal in structure. Mild mitral valve regurgitation. No evidence of mitral stenosis. Moderate mitral annular  calcification.  6. The aortic valve is tricuspid. There is mild calcification of the aortic valve. There is mild thickening of the aortic valve. Aortic valve regurgitation is not visualized. Aortic valve sclerosis is present, with no evidence of aortic valve stenosis.  7. Aortic dilatation noted. There is moderate dilatation of the aortic root, measuring 47 mm. Comparison(s): No significant change from prior study. Prior images reviewed side by side. FINDINGS  Left Ventricle: Left ventricular ejection fraction, by estimation, is 60 to 65%. The left ventricle has normal function. The left ventricle has no regional wall motion abnormalities. The left ventricular internal cavity size was normal in size. There is  no left ventricular hypertrophy. Left ventricular diastolic function could not be evaluated due to atrial fibrillation. Left ventricular diastolic function could not be evaluated. Right Ventricle: The right ventricular size is normal. Right vetricular wall thickness was not well visualized. Right ventricular systolic function is normal. There is normal pulmonary artery systolic pressure. The tricuspid regurgitant velocity is 2.46 m/s, and with an assumed right atrial pressure of 5 mmHg, the estimated right ventricular systolic pressure is 29.2 mmHg. Left Atrium: Left atrial size was severely dilated. Right Atrium: Right atrial size was moderately dilated. Pericardium: There is no evidence of pericardial effusion. Mitral Valve: The mitral valve is normal in structure. Moderate mitral annular calcification. Mild mitral valve regurgitation. No evidence of mitral valve stenosis. MV peak gradient, 6.0 mmHg. The  mean mitral valve gradient is 2.0 mmHg. Tricuspid Valve: The tricuspid valve is normal in structure. Tricuspid valve regurgitation is trivial. Aortic Valve: The aortic valve is tricuspid. There is mild calcification of the aortic valve. There is mild thickening of the aortic valve. Aortic valve regurgitation  is not visualized. Aortic valve sclerosis is present, with no evidence of aortic valve stenosis. Aortic valve mean gradient measures 3.3 mmHg. Aortic valve peak gradient measures 7.3 mmHg. Aortic valve area, by VTI measures 2.03 cm. Pulmonic Valve: The pulmonic valve was not well visualized. Pulmonic valve regurgitation is not visualized. Aorta: Aortic dilatation noted. There is moderate dilatation of the aortic root, measuring 47 mm. Venous: The inferior vena cava was not well visualized. IAS/Shunts: No atrial level shunt detected by color flow Doppler.  LEFT VENTRICLE PLAX 2D LVIDd:         4.70 cm     Diastology LVIDs:         2.60 cm     LV e' medial:    5.87 cm/s LV PW:         0.80 cm     LV E/e' medial:  18.1 LV IVS:        1.00 cm     LV e' lateral:   6.53 cm/s LVOT diam:     2.10 cm     LV E/e' lateral: 16.2 LV SV:         46 LV SV Index:   23 LVOT Area:     3.46 cm  LV Volumes (MOD) LV vol d, MOD A2C: 74.1 ml LV vol d, MOD A4C: 66.2 ml LV vol s, MOD A2C: 28.0 ml LV vol s, MOD A4C: 17.9 ml LV SV MOD A2C:     46.1 ml LV SV MOD A4C:     66.2 ml LV SV MOD BP:      46.5 ml RIGHT VENTRICLE RV Basal diam:  3.80 cm RV Mid diam:    2.40 cm RV S prime:     8.59 cm/s TAPSE (M-mode): 3.3 cm LEFT ATRIUM             Index        RIGHT ATRIUM           Index LA diam:        4.10 cm 2.07 cm/m   RA Area:     24.50 cm LA Vol (A2C):   86.6 ml 43.65 ml/m  RA Volume:   74.80 ml  37.70 ml/m LA Vol (A4C):   81.3 ml 40.98 ml/m LA Biplane Vol: 89.7 ml 45.21 ml/m  AORTIC VALVE                    PULMONIC VALVE AV Area (Vmax):    1.73 cm     PV Vmax:       0.71 m/s AV Area (Vmean):   1.81 cm     PV Peak grad:  2.0 mmHg AV Area (VTI):     2.03 cm AV Vmax:           134.67 cm/s AV Vmean:          82.900 cm/s AV VTI:            0.228 m AV Peak Grad:      7.3 mmHg AV Mean Grad:      3.3 mmHg LVOT Vmax:         67.15 cm/s LVOT Vmean:  43.250 cm/s LVOT VTI:          0.134 m LVOT/AV VTI ratio: 0.58  AORTA Ao Root diam:  4.70 cm Ao Asc diam:  3.30 cm MITRAL VALVE                TRICUSPID VALVE MV Area (PHT): 2.78 cm     TR Peak grad:   24.2 mmHg MV Area VTI:   1.63 cm     TR Vmax:        246.00 cm/s MV Peak grad:  6.0 mmHg MV Mean grad:  2.0 mmHg     SHUNTS MV Vmax:       1.22 m/s     Systemic VTI:  0.13 m MV Vmean:      60.5 cm/s    Systemic Diam: 2.10 cm MV Decel Time: 273 msec MV E velocity: 106.00 cm/s Karyl Paget Croitoru MD Electronically signed by Luana Rumple MD Signature Date/Time: 03/22/2024/1:39:42 PM    Final    DG Chest Port 1 View Result Date: 03/21/2024 CLINICAL DATA:  Chest pain with shortness of breath. EXAM: PORTABLE CHEST 1 VIEW COMPARISON:  06/09/2023. FINDINGS: Heart is enlarged and the mediastinal contour is stable. The pulmonary vasculature is distended. Interstitial prominence is noted bilaterally with mild airspace disease at the lung bases. There are small bilateral pleural effusions. No pneumothorax is seen. No acute osseous abnormality. IMPRESSION: 1. Cardiomegaly with pulmonary vascular congestion. 2. Interstitial prominence bilaterally with airspace disease at the lung bases, possible edema or infiltrate. 3. Small bilateral pleural effusions. Electronically Signed   By: Wyvonnia Heimlich M.D.   On: 03/21/2024 18:41     The results of significant diagnostics from this hospitalization (including imaging, microbiology, ancillary and laboratory) are listed below for reference.     Microbiology: Recent Results (from the past 240 hours)  Resp panel by RT-PCR (RSV, Flu A&B, Covid) Anterior Nasal Swab     Status: None   Collection Time: 03/21/24  5:57 PM   Specimen: Anterior Nasal Swab  Result Value Ref Range Status   SARS Coronavirus 2 by RT PCR NEGATIVE NEGATIVE Final    Comment: (NOTE) SARS-CoV-2 target nucleic acids are NOT DETECTED.  The SARS-CoV-2 RNA is generally detectable in upper respiratory specimens during the acute phase of infection. The lowest concentration of SARS-CoV-2 viral  copies this assay can detect is 138 copies/mL. A negative result does not preclude SARS-Cov-2 infection and should not be used as the sole basis for treatment or other patient management decisions. A negative result may occur with  improper specimen collection/handling, submission of specimen other than nasopharyngeal swab, presence of viral mutation(s) within the areas targeted by this assay, and inadequate number of viral copies(<138 copies/mL). A negative result must be combined with clinical observations, patient history, and epidemiological information. The expected result is Negative.  Fact Sheet for Patients:  BloggerCourse.com  Fact Sheet for Healthcare Providers:  SeriousBroker.it  This test is no t yet approved or cleared by the [place]  FDA and  has been authorized for detection and/or diagnosis of SARS-CoV-2 by FDA under an Emergency Use Authorization (EUA). This EUA will remain  in effect (meaning this test can be used) for the duration of the COVID-19 declaration under Section 564(b)(1) of the Act, 21 U.S.C.section 360bbb-3(b)(1), unless the authorization is terminated  or revoked sooner.       Influenza A by PCR NEGATIVE NEGATIVE Final   Influenza B by PCR NEGATIVE NEGATIVE Final    Comment: (NOTE)  The Xpert Xpress SARS-CoV-2/FLU/RSV plus assay is intended as an aid in the diagnosis of influenza from Nasopharyngeal swab specimens and should not be used as a sole basis for treatment. Nasal washings and aspirates are unacceptable for Xpert Xpress SARS-CoV-2/FLU/RSV testing.  Fact Sheet for Patients: BloggerCourse.com  Fact Sheet for Healthcare Providers: SeriousBroker.it  This test is not yet approved or cleared by the [place] FDA and has been authorized for detection and/or diagnosis of SARS-CoV-2 by FDA under an Emergency Use Authorization (EUA). This  EUA will remain in effect (meaning this test can be used) for the duration of the COVID-19 declaration under Section 564(b)(1) of the Act, 21 U.S.C. section 360bbb-3(b)(1), unless the authorization is terminated or revoked.     Resp Syncytial Virus by PCR NEGATIVE NEGATIVE Final    Comment: (NOTE) Fact Sheet for Patients: BloggerCourse.com  Fact Sheet for Healthcare Providers: SeriousBroker.it  This test is not yet approved or cleared by the [place] FDA and has been authorized for detection and/or diagnosis of SARS-CoV-2 by FDA under an Emergency Use Authorization (EUA). This EUA will remain in effect (meaning this test can be used) for the duration of the COVID-19 declaration under Section 564(b)(1) of the Act, 21 U.S.C. section 360bbb-3(b)(1), unless the authorization is terminated or revoked.  Performed at [facility], [address], [place], [place] [address]      Labs: BNP (last 3 results) Recent Labs    03/21/24 1740 03/23/24 0333  BNP 431.9* 187.3*   Basic Metabolic Panel: Recent Labs  Lab 03/21/24 1740 03/21/24 2244 03/22/24 0355 03/23/24 0333 03/24/24 0341  NA 142 139 143 139 140  K 3.5 3.8 3.8 3.4* 3.5  CL 110 108 108 102 106  CO2 22 22 26 26 24   GLUCOSE 165* 194* 143* 141* 156*  BUN 14 13 14 15 21   CREATININE 1.14 0.99 1.02 0.96 1.13  CALCIUM 9.6 9.1 9.2 8.7* 8.8*  MG  --  1.8 1.9 1.9 1.8  PHOS  --  2.6 3.0  --   --    Liver Function Tests: Recent Labs  Lab 03/21/24 2244 03/22/24 0355  AST 22 20  ALT 14 13  ALKPHOS 62 59  BILITOT 2.3* 2.5*  PROT 6.9 6.7  ALBUMIN 3.8 3.6   No results for input(s): "LIPASE", "AMYLASE" in the last 168 hours. No results for input(s): "AMMONIA" in the last 168 hours. CBC: Recent Labs  Lab 03/21/24 1740 03/22/24 0355  WBC 8.2 6.8  HGB 14.3 13.7  HCT 46.0 44.5  MCV 94.3 97.4  PLT 173 150   Cardiac Enzymes: No results for  input(s): "CKTOTAL", "CKMB", "CKMBINDEX", "TROPONINI" in the last 168 hours. BNP: Invalid input(s): "POCBNP" CBG: Recent Labs  Lab 03/23/24 0738 03/23/24 1254 03/23/24 1748 03/23/24 2046 03/24/24 0741  GLUCAP 125* 153* 115* 170* 127*   D-Dimer No results for input(s): "DDIMER" in the last 72 hours. Hgb A1c Recent Labs    03/22/24 0355  HGBA1C 6.2*   Lipid Profile No results for input(s): "CHOL", "HDL", "LDLCALC", "TRIG", "CHOLHDL", "LDLDIRECT" in the last 72 hours. Thyroid function studies Recent Labs    03/21/24 2244  TSH 5.170*   Anemia work up No results for input(s): "VITAMINB12", "FOLATE", "FERRITIN", "TIBC", "IRON", "RETICCTPCT" in the last 72 hours. Urinalysis    Component Value Date/Time   COLORURINE YELLOW 04/28/2021 1604   APPEARANCEUR HAZY (A) 04/28/2021 1604   LABSPEC 1.018 04/28/2021 1604   PHURINE 6.0 04/28/2021 1604   GLUCOSEU NEGATIVE  04/28/2021 1604   HGBUR LARGE (A) 04/28/2021 1604   HGBUR trace-intact 06/18/2008 0851   BILIRUBINUR NEGATIVE 04/28/2021 1604   BILIRUBINUR negative 11/11/2015 1029   BILIRUBINUR neg 12/11/2012 1520   KETONESUR 15 (A) 04/28/2021 1604   PROTEINUR 30 (A) 04/28/2021 1604   UROBILINOGEN 0.2 11/11/2015 1029   UROBILINOGEN 0.2 05/25/2015 0219   NITRITE NEGATIVE 04/28/2021 1604   LEUKOCYTESUR LARGE (A) 04/28/2021 1604   Sepsis Labs Recent Labs  Lab 03/21/24 1740 03/22/24 0355  WBC 8.2 6.8   Microbiology Recent Results (from the past 240 hours)  Resp panel by RT-PCR (RSV, Flu A&B, Covid) Anterior Nasal Swab     Status: None   Collection Time: 03/21/24  5:57 PM   Specimen: Anterior Nasal Swab  Result Value Ref Range Status   SARS Coronavirus 2 by RT PCR NEGATIVE NEGATIVE Final    Comment: (NOTE) SARS-CoV-2 target nucleic acids are NOT DETECTED.  The SARS-CoV-2 RNA is generally detectable in upper respiratory specimens during the acute phase of infection. The lowest concentration of SARS-CoV-2 viral copies this  assay can detect is 138 copies/mL. A negative result does not preclude SARS-Cov-2 infection and should not be used as the sole basis for treatment or other patient management decisions. A negative result may occur with  improper specimen collection/handling, submission of specimen other than nasopharyngeal swab, presence of viral mutation(s) within the areas targeted by this assay, and inadequate number of viral copies(<138 copies/mL). A negative result must be combined with clinical observations, patient history, and epidemiological information. The expected result is Negative.  Fact Sheet for Patients:  BloggerCourse.com  Fact Sheet for Healthcare Providers:  SeriousBroker.it  This test is no t yet approved or cleared by the [place]  FDA and  has been authorized for detection and/or diagnosis of SARS-CoV-2 by FDA under an Emergency Use Authorization (EUA). This EUA will remain  in effect (meaning this test can be used) for the duration of the COVID-19 declaration under Section 564(b)(1) of the Act, 21 U.S.C.section 360bbb-3(b)(1), unless the authorization is terminated  or revoked sooner.       Influenza A by PCR NEGATIVE NEGATIVE Final   Influenza B by PCR NEGATIVE NEGATIVE Final    Comment: (NOTE) The Xpert Xpress SARS-CoV-2/FLU/RSV plus assay is intended as an aid in the diagnosis of influenza from Nasopharyngeal swab specimens and should not be used as a sole basis for treatment. Nasal washings and aspirates are unacceptable for Xpert Xpress SARS-CoV-2/FLU/RSV testing.  Fact Sheet for Patients: BloggerCourse.com  Fact Sheet for Healthcare Providers: SeriousBroker.it  This test is not yet approved or cleared by the [place]  FDA and has been authorized for detection and/or diagnosis of SARS-CoV-2 by FDA under an Emergency Use Authorization (EUA). This EUA will  remain in effect (meaning this test can be used) for the duration of the COVID-19 declaration under Section 564(b)(1) of the Act, 21 U.S.C. section 360bbb-3(b)(1), unless the authorization is terminated or revoked.     Resp Syncytial Virus by PCR NEGATIVE NEGATIVE Final    Comment: (NOTE) Fact Sheet for Patients: BloggerCourse.com  Fact Sheet for Healthcare Providers: SeriousBroker.it  This test is not yet approved or cleared by the [place]  FDA and has been authorized for detection and/or diagnosis of SARS-CoV-2 by FDA under an Emergency Use Authorization (EUA). This EUA will remain in effect (meaning this test can be used) for the duration of the COVID-19 declaration under Section 564(b)(1) of the Act, 21 U.S.C. section 360bbb-3(b)(1), unless the authorization  is terminated or revoked.  Performed at [facility], [address] [place], [place] [address]      Time coordinating discharge:  I have spent 35 minutes face to face with the patient and on the [place] discussing the patients care, assessment, plan and disposition with other care givers. >50% of the time was devoted counseling the patient about the risks and benefits of treatment/Discharge disposition and coordinating care.   SIGNED:   Maggie Schooner, MD  [facility] 03/24/2024, 11:18 AM   If 7PM-7AM, please contact night-coverage

## 2024-03-24 NOTE — Progress Notes (Addendum)
 SATURATION QUALIFICATIONS: (This note is used to comply with regulatory documentation for home oxygen)  Patient Saturations on Room Air at Rest = 88%  Patient Saturations on Room Air while Ambulating = 84%  Patient Saturations on 2 Liters of oxygen while Ambulating = 91%  Please briefly explain why patient needs home oxygen: Patient was unable to maintain oxygen saturation levels above 88% on RA while ambulating. He required 2 LPM of supplemental oxygen to maintain his oxygen saturation at 91%. He tolerated ambulating well without complaints of dizziness, light-headedness, and mild complaints of SOB.

## 2024-03-25 DIAGNOSIS — J449 Chronic obstructive pulmonary disease, unspecified: Secondary | ICD-10-CM | POA: Diagnosis not present

## 2024-03-25 DIAGNOSIS — I509 Heart failure, unspecified: Secondary | ICD-10-CM | POA: Diagnosis not present

## 2024-03-28 DIAGNOSIS — I5033 Acute on chronic diastolic (congestive) heart failure: Secondary | ICD-10-CM | POA: Diagnosis not present

## 2024-03-28 DIAGNOSIS — Z860100 Personal history of colon polyps, unspecified: Secondary | ICD-10-CM | POA: Diagnosis not present

## 2024-03-28 DIAGNOSIS — J9601 Acute respiratory failure with hypoxia: Secondary | ICD-10-CM | POA: Diagnosis not present

## 2024-03-28 DIAGNOSIS — K589 Irritable bowel syndrome without diarrhea: Secondary | ICD-10-CM | POA: Diagnosis not present

## 2024-03-28 DIAGNOSIS — I77819 Aortic ectasia, unspecified site: Secondary | ICD-10-CM | POA: Diagnosis not present

## 2024-03-28 DIAGNOSIS — J449 Chronic obstructive pulmonary disease, unspecified: Secondary | ICD-10-CM | POA: Diagnosis not present

## 2024-03-28 DIAGNOSIS — Z87891 Personal history of nicotine dependence: Secondary | ICD-10-CM | POA: Diagnosis not present

## 2024-03-28 DIAGNOSIS — N400 Enlarged prostate without lower urinary tract symptoms: Secondary | ICD-10-CM | POA: Diagnosis not present

## 2024-03-28 DIAGNOSIS — K219 Gastro-esophageal reflux disease without esophagitis: Secondary | ICD-10-CM | POA: Diagnosis not present

## 2024-03-28 DIAGNOSIS — Z87442 Personal history of urinary calculi: Secondary | ICD-10-CM | POA: Diagnosis not present

## 2024-03-28 DIAGNOSIS — Z7901 Long term (current) use of anticoagulants: Secondary | ICD-10-CM | POA: Diagnosis not present

## 2024-03-28 DIAGNOSIS — E669 Obesity, unspecified: Secondary | ICD-10-CM | POA: Diagnosis not present

## 2024-03-28 DIAGNOSIS — I4821 Permanent atrial fibrillation: Secondary | ICD-10-CM | POA: Diagnosis not present

## 2024-03-28 DIAGNOSIS — I110 Hypertensive heart disease with heart failure: Secondary | ICD-10-CM | POA: Diagnosis not present

## 2024-03-28 DIAGNOSIS — I7771 Dissection of carotid artery: Secondary | ICD-10-CM | POA: Diagnosis not present

## 2024-03-28 DIAGNOSIS — I083 Combined rheumatic disorders of mitral, aortic and tricuspid valves: Secondary | ICD-10-CM | POA: Diagnosis not present

## 2024-03-28 DIAGNOSIS — J849 Interstitial pulmonary disease, unspecified: Secondary | ICD-10-CM | POA: Diagnosis not present

## 2024-03-28 DIAGNOSIS — G4733 Obstructive sleep apnea (adult) (pediatric): Secondary | ICD-10-CM | POA: Diagnosis not present

## 2024-03-28 DIAGNOSIS — Z6823 Body mass index (BMI) 23.0-23.9, adult: Secondary | ICD-10-CM | POA: Diagnosis not present

## 2024-03-28 DIAGNOSIS — K5730 Diverticulosis of large intestine without perforation or abscess without bleeding: Secondary | ICD-10-CM | POA: Diagnosis not present

## 2024-03-28 DIAGNOSIS — I671 Cerebral aneurysm, nonruptured: Secondary | ICD-10-CM | POA: Diagnosis not present

## 2024-03-28 DIAGNOSIS — I2721 Secondary pulmonary arterial hypertension: Secondary | ICD-10-CM | POA: Diagnosis not present

## 2024-03-28 DIAGNOSIS — G8929 Other chronic pain: Secondary | ICD-10-CM | POA: Diagnosis not present

## 2024-03-28 DIAGNOSIS — E785 Hyperlipidemia, unspecified: Secondary | ICD-10-CM | POA: Diagnosis not present

## 2024-04-01 DIAGNOSIS — D0461 Carcinoma in situ of skin of right upper limb, including shoulder: Secondary | ICD-10-CM | POA: Diagnosis not present

## 2024-04-01 DIAGNOSIS — S50811A Abrasion of right forearm, initial encounter: Secondary | ICD-10-CM | POA: Diagnosis not present

## 2024-04-01 DIAGNOSIS — Z85828 Personal history of other malignant neoplasm of skin: Secondary | ICD-10-CM | POA: Diagnosis not present

## 2024-04-01 DIAGNOSIS — L570 Actinic keratosis: Secondary | ICD-10-CM | POA: Diagnosis not present

## 2024-04-01 DIAGNOSIS — C44311 Basal cell carcinoma of skin of nose: Secondary | ICD-10-CM | POA: Diagnosis not present

## 2024-04-01 DIAGNOSIS — D692 Other nonthrombocytopenic purpura: Secondary | ICD-10-CM | POA: Diagnosis not present

## 2024-04-01 DIAGNOSIS — D485 Neoplasm of uncertain behavior of skin: Secondary | ICD-10-CM | POA: Diagnosis not present

## 2024-04-01 DIAGNOSIS — D1801 Hemangioma of skin and subcutaneous tissue: Secondary | ICD-10-CM | POA: Diagnosis not present

## 2024-04-06 DIAGNOSIS — I509 Heart failure, unspecified: Secondary | ICD-10-CM | POA: Diagnosis not present

## 2024-04-06 DIAGNOSIS — J849 Interstitial pulmonary disease, unspecified: Secondary | ICD-10-CM | POA: Diagnosis not present

## 2024-04-06 DIAGNOSIS — R5381 Other malaise: Secondary | ICD-10-CM | POA: Diagnosis not present

## 2024-04-08 DIAGNOSIS — G4734 Idiopathic sleep related nonobstructive alveolar hypoventilation: Secondary | ICD-10-CM | POA: Diagnosis not present

## 2024-04-08 DIAGNOSIS — G4733 Obstructive sleep apnea (adult) (pediatric): Secondary | ICD-10-CM | POA: Diagnosis not present

## 2024-04-08 DIAGNOSIS — J849 Interstitial pulmonary disease, unspecified: Secondary | ICD-10-CM | POA: Diagnosis not present

## 2024-04-09 ENCOUNTER — Telehealth (HOSPITAL_COMMUNITY): Payer: Self-pay | Admitting: Cardiology

## 2024-04-09 NOTE — Telephone Encounter (Signed)
 [facility] Called to report fall in the home early AM without injury. Pt reports he was walking to bathroom and fell. Unsure how it happened   During PT visit this afternoon  b/p standing 80/60 sitting 110/80 Was able to complete PT session   Follow up 5/7 Message to provider as Arlie Lain

## 2024-04-10 NOTE — Telephone Encounter (Signed)
 Appt notes updated

## 2024-04-14 DIAGNOSIS — J849 Interstitial pulmonary disease, unspecified: Secondary | ICD-10-CM | POA: Diagnosis not present

## 2024-04-15 ENCOUNTER — Ambulatory Visit (HOSPITAL_COMMUNITY)
Admission: RE | Admit: 2024-04-15 | Discharge: 2024-04-15 | Disposition: A | Discharge status: Home / Self Care | Source: Ambulatory Visit | Attending: Internal Medicine | Admitting: Internal Medicine

## 2024-04-15 ENCOUNTER — Encounter (HOSPITAL_COMMUNITY): Payer: Self-pay | Admitting: Internal Medicine

## 2024-04-15 VITALS — BP 110/70 | HR 64 | Wt 161.0 lb

## 2024-04-15 DIAGNOSIS — I340 Nonrheumatic mitral (valve) insufficiency: Secondary | ICD-10-CM | POA: Diagnosis not present

## 2024-04-15 DIAGNOSIS — I110 Hypertensive heart disease with heart failure: Secondary | ICD-10-CM | POA: Insufficient documentation

## 2024-04-15 DIAGNOSIS — I5032 Chronic diastolic (congestive) heart failure: Secondary | ICD-10-CM | POA: Insufficient documentation

## 2024-04-15 DIAGNOSIS — I482 Chronic atrial fibrillation, unspecified: Secondary | ICD-10-CM | POA: Diagnosis not present

## 2024-04-15 DIAGNOSIS — I77810 Thoracic aortic ectasia: Secondary | ICD-10-CM | POA: Diagnosis not present

## 2024-04-15 DIAGNOSIS — I671 Cerebral aneurysm, nonruptured: Secondary | ICD-10-CM | POA: Insufficient documentation

## 2024-04-15 DIAGNOSIS — Z79899 Other long term (current) drug therapy: Secondary | ICD-10-CM | POA: Insufficient documentation

## 2024-04-15 DIAGNOSIS — Z7901 Long term (current) use of anticoagulants: Secondary | ICD-10-CM | POA: Diagnosis not present

## 2024-04-15 DIAGNOSIS — I4821 Permanent atrial fibrillation: Secondary | ICD-10-CM | POA: Diagnosis not present

## 2024-04-15 DIAGNOSIS — Z66 Do not resuscitate: Secondary | ICD-10-CM | POA: Diagnosis not present

## 2024-04-15 DIAGNOSIS — I5022 Chronic systolic (congestive) heart failure: Secondary | ICD-10-CM

## 2024-04-15 DIAGNOSIS — R918 Other nonspecific abnormal finding of lung field: Secondary | ICD-10-CM | POA: Diagnosis not present

## 2024-04-15 DIAGNOSIS — Z9181 History of falling: Secondary | ICD-10-CM | POA: Diagnosis not present

## 2024-04-15 DIAGNOSIS — G4733 Obstructive sleep apnea (adult) (pediatric): Secondary | ICD-10-CM | POA: Diagnosis not present

## 2024-04-15 MED ORDER — POTASSIUM CHLORIDE CRYS ER 20 MEQ PO TBCR: 40.0000 meq | EXTENDED_RELEASE_TABLET | ORAL | Status: DC

## 2024-04-15 MED ORDER — FUROSEMIDE 40 MG PO TABS: 40.0000 mg | ORAL_TABLET | ORAL | Status: DC

## 2024-04-15 NOTE — Patient Instructions (Signed)
 Great to see you today!!!  Take Furosmide 40 mg every Monday and Friday  Take Potassium 40 meq (2 tabs) every Monday and Friday  Your physician recommends that you schedule a follow-up appointment in: 6 months (November), **PLEASE CALL OUR OFFICE IN SEPTEMBER TO SCHEDULE THIS APPOITMENT  If you have any questions or concerns before your next appointment please send us  a message through [place] or call our office at [phone].    TO LEAVE A MESSAGE FOR THE NURSE SELECT OPTION 2, PLEASE LEAVE A MESSAGE INCLUDING: YOUR NAME DATE OF BIRTH CALL BACK NUMBER REASON FOR CALL**this is important as we prioritize the call backs  YOU WILL RECEIVE A CALL BACK THE SAME DAY AS LONG AS YOU CALL BEFORE 4:00 PM  At the [facility], you and your health needs are our priority. As part of our continuing mission to provide you with exceptional heart care, we have created designated Provider Care Teams. These Care Teams include your primary Cardiologist (physician) and Advanced Practice Providers (APPs- Physician Assistants and Nurse Practitioners) who all work together to provide you with the care you need, when you need it.   You may see any of the following providers on your designated Care Team at your next follow up: Dr Jules Oar Dr Peder Bourdon Dr. Alwin Baars Dr. Arta Lark Amy Marijane Shoulders, NP Ruddy Corral, [name] [facility] [place], [name] Dennise Fitz, NP [place] Lee, NP Shawnee Dellen, NP Luster Salters, PharmD Bevely Brush, PharmD   Please be sure to bring in all your medications bottles to every appointment.    Thank you for choosing [facility]

## 2024-04-15 NOTE — Progress Notes (Signed)
 CARDIOLOGY CLINIC NOTE  Patient ID: Maurice Wood, male   DOB: 15-Dec-1932, 89 y.o.   MRN: [id number]  HPI:  Maurice Wood is an 89 y.o. 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  Had episode of CP in January 2011 and underwent cath which showed normal coronaries with EF45% (? artificially low due to AF). Echo  2/11: 60-65%  Echo 7/16  EF 60% Mod MR.  ECHO 2017 EF 60-65%.   Echo 11/18 EF 60-65% severe MAC. Moderate MR  He was seen in the ER in 6/21 for progressive SOB and cough. No CP. hstrop normal. (7,7). BNP 287 (in setting of chronic AF)  ECG AF 105. CXR showed mass-like density in the right infrahilar region   CT chest 06/09/20 concerning for moderate COPD and  ILD. Referred to Pulmonary   7/21 TEE to evaluate MR  EF 60-65% Normal RV  MV thickened with bileaflet prolapse 2-3+ MR  Echo  09/27/21 EF 60-65% Normal RV MV thick moderate MR. Mild to moderate AI. Personally reviewed  Echo 01/25/23: EF 60-65% Normal RV Mild AS/AI/MR  Admitted 4/25 with volume overload. Diuresed about 8 pounds  Echo 4/25 EF 60-65% mild MR Aoroot 4.7 Here for f/u with his wife and son. Says he is doing pretty good. Takes lasix  only as needed for weight gain of 2+ pounds or LE swelling -> about 1x/week. Mild LE edema. Struggling with his balance and having falls. No CP or syncope. No orthopnea or PND. BP ok.    Studies:  Echo 9/20 EF 60% mild-mod MR mild AI Severe LAE Personally reviewed  R/L cath 7/21   Ao = 129/73 (101) LV =  133/10 RA = 7 RV = 54/6 PA = 54/14 (30) PCW = 15 (V = 25) Fick cardiac output/index = 5.1/2.5 PVR = 3.0 Ao sat = 97% PA sat = 70%, 71%   Assessment: 1. Normal coronaries with left dominant system. RCA not injected but shown to be very small non-dominant vessel on previous study. LAD with small aneurysmal segment proximally but otherwise normal 2. EF 65% 3. Mild PAH with normal  output. PCWP 15. V wave 22-25   PFTs 12/21: FEV1 1.87 (70%), FVC 2.90 (76%) DLCO 60%   Review of systems complete and found to be negative unless listed in HPI.   Past Medical History:  Diagnosis Date   Allergy    rhinitis   Atrial fibrillation [facility]) Jan 2007   echo 1-07 normal ejection fraction, no significant valvular disease. adenosine  cardiolite  1-07  no evidence of ischemia. A 48 hour Holter monitor in 7-07 showed good rate controlw/ chronic atrial fibrillation. Cath 1/11 normal cors. EF 45%. Echo 2-11 60-65%   Benign prostatic hypertrophy    Cerebral aneurysm    followed by Dr Alvira Josephs   CHF (congestive heart failure) (HCC)    COPD (chronic obstructive pulmonary disease) (HCC)    Fatigue    History of chest pain    a. s/p LHC 05/2014 with normal cors   Hx of colonic polyps    diverticulosis   Hyperlipidemia    IBS (irritable bowel syndrome)    Incontinence    Per pt 12/14/11   Kidney stones    Obesity    Obstructive sleep apnea    noncompliant with CPAP   Retinal vein occlusion     Current Outpatient Medications  Medication Sig Dispense Refill   albuterol  (VENTOLIN   HFA) 108 (90 Base) MCG/ACT inhaler Inhale 2 puffs into the lungs every 6 (six) hours as needed for wheezing or shortness of breath. 8 g 6   apixaban  (ELIQUIS ) 5 MG TABS tablet TAKE 1 TABLET BY MOUTH TWICE A DAY 60 tablet 2   cholecalciferol (VITAMIN D3) 25 MCG (1000 UNIT) tablet Take 1,000 Units by mouth daily.     Fluticasone -Umeclidin-Vilant (TRELEGY ELLIPTA ) 100-62.5-25 MCG/ACT AEPB INHALE 1 PUFF INTO THE LUNGS DAILY IN THE AFTERNOON 60 each 3   furosemide  (LASIX ) 40 MG tablet Take 40 mg by mouth daily as needed.     Multiple Vitamin (MULTIVITAMIN WITH MINERALS) TABS tablet Take 1 tablet by mouth daily.     omeprazole  (PRILOSEC) 20 MG capsule Take 1 capsule (20 mg total) by mouth daily. 30 capsule 6   potassium chloride  SA (KLOR-CON  M) 20 MEQ tablet Take 20 mEq by mouth as needed.     vitamin B-12  (CYANOCOBALAMIN ) 1000 MCG tablet Take 1,000 mcg by mouth daily.     nitroGLYCERIN  (NITROSTAT ) 0.4 MG SL tablet Place 1 tablet (0.4 mg total) under the tongue every 5 (five) minutes as needed for chest pain. 30 tablet 0   No current facility-administered medications for this encounter.     PHYSICAL EXAM: Vitals:   04/15/24 0918 04/15/24 0919  BP: 110/70 110/70  Pulse: 66 64  SpO2: 95% 95%    Wt Readings from Last 3 Encounters:  04/15/24 73 kg (161 lb)  03/24/24 76.9 kg (169 lb 8.5 oz)  11/05/23 79.4 kg (175 lb)   Physical exam: General:  Elderly. No resp difficulty HEENT: normal Neck: supple. JVP 7. Carotids 2+ bilat; no bruits. No lymphadenopathy or thryomegaly appreciated. Cor: PMI nondisplaced. Irregular rate & rhythm. No rubs, gallops or murmurs. Lungs: clear Abdomen: soft, nontender, nondistended. No hepatosplenomegaly. No bruits or masses. Good bowel sounds. Extremities: no cyanosis, clubbing, rash, trace-1+ edema Neuro: alert & orientedx3, cranial nerves grossly intact. moves all 4 extremities w/o difficulty. Affect pleasant    Lab Results  Component Value Date   CHOL 166 02/24/2014   HDL 53.80 02/24/2014   LDLCALC 84 02/24/2014   LDLDIRECT 97.4 11/09/2011   TRIG 143.0 02/24/2014   CHOLHDL 3 02/24/2014    ASSESSMENT & PLAN:  1. Chronic diastolic HF - Echo 09/27/21 EF 60-65% l RV MV thick moderate MR. Mild to moderate AI.  - Echo  01/25/23: EF 60-65% Normal RV Mild AS/AI/MR - Echo 4/25 EF 60-65% mild MR A[place] 4.7  - Overall improved after recent admission - Still with some volume overload - Will schedule lasix  40mg  every M/F and discussed need to use sliding scale lasix  a b it more liberally  2. A-fib -  Permanent. - Rate controlled Continue eliquis  5 mg twice a day. No indication for dose reduction currently - If continues to have falls - may need to stop  3. Mitral regurgitatiom - Moderate MR on echo 10/2017. EF 60-65% Severe MAC ?prolapse, but leaflets  not seen well.  - TEE 7/21 with 2-3+ MR - Echo 09/27/21 EF 60-65% Normal RV MV thick moderate MR. Mild to moderate AI.  - Echo  01/25/23: EF 60-65% Normal RV Mild AS/AI/MR -  Echo 4/25 EF 60-65% mild MR A[place] 4.7  -  Continue to follow  4. OSA - compliant with CPAP  5. Abnormal chest CT - Felt to have COPD +/- ILD. Has seen Dr. Matilde Son.   6. Aortic root dilation - Echo  (01/25/23)  AoRoot 4.4cm.(likely overestimated) .  -  Echo 4/25 EF 60-65% mild MR A[place] 4.7  - Overall stable. Not candidate for surgical repair  7. DNR/DNI - confirmed today   Jules Oar, MD  3:52 PM

## 2024-04-16 ENCOUNTER — Other Ambulatory Visit (HOSPITAL_COMMUNITY): Payer: Self-pay | Admitting: Internal Medicine

## 2024-04-22 DIAGNOSIS — J849 Interstitial pulmonary disease, unspecified: Secondary | ICD-10-CM | POA: Diagnosis not present

## 2024-04-22 DIAGNOSIS — Z87891 Personal history of nicotine dependence: Secondary | ICD-10-CM | POA: Diagnosis not present

## 2024-04-22 DIAGNOSIS — J984 Other disorders of lung: Secondary | ICD-10-CM | POA: Diagnosis not present

## 2024-04-24 DIAGNOSIS — J449 Chronic obstructive pulmonary disease, unspecified: Secondary | ICD-10-CM | POA: Diagnosis not present

## 2024-04-24 DIAGNOSIS — I509 Heart failure, unspecified: Secondary | ICD-10-CM | POA: Diagnosis not present

## 2024-04-30 DIAGNOSIS — Z85828 Personal history of other malignant neoplasm of skin: Secondary | ICD-10-CM | POA: Diagnosis not present

## 2024-04-30 DIAGNOSIS — C44311 Basal cell carcinoma of skin of nose: Secondary | ICD-10-CM | POA: Diagnosis not present

## 2024-05-06 ENCOUNTER — Encounter: Payer: Self-pay | Admitting: Podiatry

## 2024-05-06 ENCOUNTER — Ambulatory Visit: Payer: PPO | Admitting: Podiatry

## 2024-05-06 DIAGNOSIS — B351 Tinea unguium: Secondary | ICD-10-CM

## 2024-05-06 DIAGNOSIS — M79674 Pain in right toe(s): Secondary | ICD-10-CM | POA: Diagnosis not present

## 2024-05-06 DIAGNOSIS — D689 Coagulation defect, unspecified: Secondary | ICD-10-CM | POA: Diagnosis not present

## 2024-05-06 DIAGNOSIS — E1142 Type 2 diabetes mellitus with diabetic polyneuropathy: Secondary | ICD-10-CM

## 2024-05-06 DIAGNOSIS — M79675 Pain in left toe(s): Secondary | ICD-10-CM

## 2024-05-06 NOTE — Progress Notes (Signed)
 This patient returns to my office for at risk foot care.  This patient requires this care by a professional since this patient will be at risk due to having diabetic neuropathy, and coagulation disorder.  Patient is taking eliquiss.  This patient is unable to cut nails himself since the patient cannot reach his nails.These nails are painful walking and wearing shoes.  This patient presents for at risk foot care today.  General Appearance  Alert, conversant and in no acute stress.  Vascular  Dorsalis pedis and posterior tibial  pulses are  not palpable  bilaterally.  Capillary return is within normal limits  bilaterally. Temperature is within normal limits  bilaterally.  Neurologic  Senn-Weinstein monofilament wire test absent   bilaterally. Muscle power within normal limits bilaterally.  Nails Thick disfigured discolored nails with subungual debris  hallux nails  bilaterally. No evidence of bacterial infection or drainage bilaterally.  Orthopedic  No limitations of motion  feet .  No crepitus or effusions noted.  No bony pathology or digital deformities noted.  Skin  normotropic skin with no porokeratosis noted bilaterally.  No signs of infections or ulcers noted.     Onychomycosis  Pain in right toes  Pain in left toes  Consent was obtained for treatment procedures.   Mechanical debridement of nails 1-5  bilaterally performed with a nail nipper.  Filed with dremel without incident.    Return office visit    3 months                  Told patient to return for periodic foot care and evaluation due to potential at risk complications.   Helane Gunther DPM

## 2024-05-08 DIAGNOSIS — J849 Interstitial pulmonary disease, unspecified: Secondary | ICD-10-CM | POA: Diagnosis not present

## 2024-05-08 DIAGNOSIS — G4734 Idiopathic sleep related nonobstructive alveolar hypoventilation: Secondary | ICD-10-CM | POA: Diagnosis not present

## 2024-05-08 DIAGNOSIS — J432 Centrilobular emphysema: Secondary | ICD-10-CM | POA: Diagnosis not present

## 2024-05-08 DIAGNOSIS — G4733 Obstructive sleep apnea (adult) (pediatric): Secondary | ICD-10-CM | POA: Diagnosis not present

## 2024-05-25 DIAGNOSIS — I509 Heart failure, unspecified: Secondary | ICD-10-CM | POA: Diagnosis not present

## 2024-05-25 DIAGNOSIS — J449 Chronic obstructive pulmonary disease, unspecified: Secondary | ICD-10-CM | POA: Diagnosis not present

## 2024-06-01 ENCOUNTER — Other Ambulatory Visit (HOSPITAL_COMMUNITY): Payer: Self-pay | Admitting: Cardiology

## 2024-06-01 MED ORDER — POTASSIUM CHLORIDE CRYS ER 20 MEQ PO TBCR: 40.0000 meq | EXTENDED_RELEASE_TABLET | ORAL | 11 refills | Status: AC

## 2024-06-01 MED ORDER — FUROSEMIDE 40 MG PO TABS: 40.0000 mg | ORAL_TABLET | ORAL | 11 refills | Status: DC

## 2024-06-11 DIAGNOSIS — G4733 Obstructive sleep apnea (adult) (pediatric): Secondary | ICD-10-CM | POA: Diagnosis not present

## 2024-06-17 DIAGNOSIS — Z961 Presence of intraocular lens: Secondary | ICD-10-CM | POA: Diagnosis not present

## 2024-06-17 DIAGNOSIS — H35372 Puckering of macula, left eye: Secondary | ICD-10-CM | POA: Diagnosis not present

## 2024-06-17 DIAGNOSIS — H52203 Unspecified astigmatism, bilateral: Secondary | ICD-10-CM | POA: Diagnosis not present

## 2024-07-09 ENCOUNTER — Other Ambulatory Visit (HOSPITAL_COMMUNITY): Payer: Self-pay | Admitting: Internal Medicine

## 2024-08-02 DIAGNOSIS — J189 Pneumonia, unspecified organism: Secondary | ICD-10-CM | POA: Diagnosis not present

## 2024-08-02 DIAGNOSIS — J811 Chronic pulmonary edema: Secondary | ICD-10-CM | POA: Diagnosis not present

## 2024-08-02 DIAGNOSIS — J90 Pleural effusion, not elsewhere classified: Secondary | ICD-10-CM | POA: Diagnosis not present

## 2024-08-02 DIAGNOSIS — R051 Acute cough: Secondary | ICD-10-CM | POA: Diagnosis not present

## 2024-08-02 DIAGNOSIS — R0602 Shortness of breath: Secondary | ICD-10-CM | POA: Diagnosis not present

## 2024-08-02 DIAGNOSIS — R059 Cough, unspecified: Secondary | ICD-10-CM | POA: Diagnosis not present

## 2024-08-02 DIAGNOSIS — R918 Other nonspecific abnormal finding of lung field: Secondary | ICD-10-CM | POA: Diagnosis not present

## 2024-08-06 ENCOUNTER — Ambulatory Visit: Admitting: Podiatry

## 2024-08-06 ENCOUNTER — Encounter: Payer: Self-pay | Admitting: Podiatry

## 2024-08-06 DIAGNOSIS — M79675 Pain in left toe(s): Secondary | ICD-10-CM

## 2024-08-06 DIAGNOSIS — E1142 Type 2 diabetes mellitus with diabetic polyneuropathy: Secondary | ICD-10-CM

## 2024-08-06 DIAGNOSIS — B351 Tinea unguium: Secondary | ICD-10-CM

## 2024-08-06 DIAGNOSIS — M79674 Pain in right toe(s): Secondary | ICD-10-CM | POA: Diagnosis not present

## 2024-08-06 DIAGNOSIS — J189 Pneumonia, unspecified organism: Secondary | ICD-10-CM | POA: Diagnosis not present

## 2024-08-06 NOTE — Progress Notes (Signed)
 This patient returns to my office for at risk foot care.  This patient requires this care by a professional since this patient will be at risk due to having diabetic neuropathy, and coagulation disorder.  Patient is taking eliquiss.  This patient is unable to cut nails himself since the patient cannot reach his nails.These nails are painful walking and wearing shoes.  This patient presents for at risk foot care today.  General Appearance  Alert, conversant and in no acute stress.  Vascular  Dorsalis pedis and posterior tibial  pulses are  not palpable  bilaterally.  Capillary return is within normal limits  bilaterally. Temperature is within normal limits  bilaterally.  Neurologic  Senn-Weinstein monofilament wire test absent   bilaterally. Muscle power within normal limits bilaterally.  Nails Thick disfigured discolored nails with subungual debris  hallux nails  bilaterally. No evidence of bacterial infection or drainage bilaterally.  Orthopedic  No limitations of motion  feet .  No crepitus or effusions noted.  No bony pathology or digital deformities noted.  Skin  normotropic skin with no porokeratosis noted bilaterally.  No signs of infections or ulcers noted.     Onychomycosis  Pain in right toes  Pain in left toes  Consent was obtained for treatment procedures.   Mechanical debridement of nails 1-5  bilaterally performed with a nail nipper.  Filed with dremel without incident.    Return office visit    3 months                  Told patient to return for periodic foot care and evaluation due to potential at risk complications.   Helane Gunther DPM

## 2024-08-28 DIAGNOSIS — J432 Centrilobular emphysema: Secondary | ICD-10-CM | POA: Diagnosis not present

## 2024-08-28 DIAGNOSIS — R946 Abnormal results of thyroid function studies: Secondary | ICD-10-CM | POA: Diagnosis not present

## 2024-08-28 DIAGNOSIS — J849 Interstitial pulmonary disease, unspecified: Secondary | ICD-10-CM | POA: Diagnosis not present

## 2024-08-28 DIAGNOSIS — I509 Heart failure, unspecified: Secondary | ICD-10-CM | POA: Diagnosis not present

## 2024-08-28 DIAGNOSIS — J302 Other seasonal allergic rhinitis: Secondary | ICD-10-CM | POA: Diagnosis not present

## 2024-08-28 DIAGNOSIS — K219 Gastro-esophageal reflux disease without esophagitis: Secondary | ICD-10-CM | POA: Diagnosis not present

## 2024-08-28 DIAGNOSIS — N400 Enlarged prostate without lower urinary tract symptoms: Secondary | ICD-10-CM | POA: Diagnosis not present

## 2024-08-28 DIAGNOSIS — E538 Deficiency of other specified B group vitamins: Secondary | ICD-10-CM | POA: Diagnosis not present

## 2024-08-28 DIAGNOSIS — R609 Edema, unspecified: Secondary | ICD-10-CM | POA: Diagnosis not present

## 2024-08-28 DIAGNOSIS — I482 Chronic atrial fibrillation, unspecified: Secondary | ICD-10-CM | POA: Diagnosis not present

## 2024-08-28 DIAGNOSIS — E559 Vitamin D deficiency, unspecified: Secondary | ICD-10-CM | POA: Diagnosis not present

## 2024-08-28 DIAGNOSIS — E1169 Type 2 diabetes mellitus with other specified complication: Secondary | ICD-10-CM | POA: Diagnosis not present

## 2024-10-02 ENCOUNTER — Emergency Department (HOSPITAL_COMMUNITY)

## 2024-10-02 ENCOUNTER — Other Ambulatory Visit: Payer: Self-pay

## 2024-10-02 ENCOUNTER — Encounter (HOSPITAL_COMMUNITY): Payer: Self-pay

## 2024-10-02 ENCOUNTER — Emergency Department (HOSPITAL_COMMUNITY)
Admission: EM | Admit: 2024-10-02 | Discharge: 2024-10-02 | Disposition: A | Discharge status: Home / Self Care | Attending: Emergency Medicine | Admitting: Emergency Medicine

## 2024-10-02 DIAGNOSIS — S59901A Unspecified injury of right elbow, initial encounter: Secondary | ICD-10-CM | POA: Diagnosis not present

## 2024-10-02 DIAGNOSIS — S51001A Unspecified open wound of right elbow, initial encounter: Secondary | ICD-10-CM | POA: Insufficient documentation

## 2024-10-02 DIAGNOSIS — J439 Emphysema, unspecified: Secondary | ICD-10-CM | POA: Diagnosis not present

## 2024-10-02 DIAGNOSIS — I4891 Unspecified atrial fibrillation: Secondary | ICD-10-CM | POA: Diagnosis not present

## 2024-10-02 DIAGNOSIS — M542 Cervicalgia: Secondary | ICD-10-CM | POA: Insufficient documentation

## 2024-10-02 DIAGNOSIS — M549 Dorsalgia, unspecified: Secondary | ICD-10-CM | POA: Diagnosis not present

## 2024-10-02 DIAGNOSIS — Z043 Encounter for examination and observation following other accident: Secondary | ICD-10-CM | POA: Diagnosis not present

## 2024-10-02 DIAGNOSIS — S199XXA Unspecified injury of neck, initial encounter: Secondary | ICD-10-CM | POA: Diagnosis not present

## 2024-10-02 DIAGNOSIS — K449 Diaphragmatic hernia without obstruction or gangrene: Secondary | ICD-10-CM | POA: Insufficient documentation

## 2024-10-02 DIAGNOSIS — S3991XA Unspecified injury of abdomen, initial encounter: Secondary | ICD-10-CM | POA: Diagnosis not present

## 2024-10-02 DIAGNOSIS — M4856XA Collapsed vertebra, not elsewhere classified, lumbar region, initial encounter for fracture: Secondary | ICD-10-CM | POA: Diagnosis not present

## 2024-10-02 DIAGNOSIS — I6522 Occlusion and stenosis of left carotid artery: Secondary | ICD-10-CM | POA: Diagnosis not present

## 2024-10-02 DIAGNOSIS — Z7901 Long term (current) use of anticoagulants: Secondary | ICD-10-CM | POA: Insufficient documentation

## 2024-10-02 DIAGNOSIS — M545 Low back pain, unspecified: Secondary | ICD-10-CM | POA: Diagnosis not present

## 2024-10-02 DIAGNOSIS — N200 Calculus of kidney: Secondary | ICD-10-CM | POA: Insufficient documentation

## 2024-10-02 DIAGNOSIS — I700 Atherosclerosis of aorta: Secondary | ICD-10-CM | POA: Diagnosis not present

## 2024-10-02 DIAGNOSIS — I708 Atherosclerosis of other arteries: Secondary | ICD-10-CM | POA: Insufficient documentation

## 2024-10-02 DIAGNOSIS — W01198A Fall on same level from slipping, tripping and stumbling with subsequent striking against other object, initial encounter: Secondary | ICD-10-CM | POA: Diagnosis not present

## 2024-10-02 DIAGNOSIS — S0083XA Contusion of other part of head, initial encounter: Secondary | ICD-10-CM | POA: Diagnosis not present

## 2024-10-02 DIAGNOSIS — S51011A Laceration without foreign body of right elbow, initial encounter: Secondary | ICD-10-CM | POA: Diagnosis not present

## 2024-10-02 DIAGNOSIS — S0990XA Unspecified injury of head, initial encounter: Secondary | ICD-10-CM

## 2024-10-02 DIAGNOSIS — R9431 Abnormal electrocardiogram [ECG] [EKG]: Secondary | ICD-10-CM | POA: Diagnosis not present

## 2024-10-02 DIAGNOSIS — W19XXXA Unspecified fall, initial encounter: Secondary | ICD-10-CM | POA: Diagnosis not present

## 2024-10-02 DIAGNOSIS — M47812 Spondylosis without myelopathy or radiculopathy, cervical region: Secondary | ICD-10-CM | POA: Diagnosis not present

## 2024-10-02 DIAGNOSIS — K4020 Bilateral inguinal hernia, without obstruction or gangrene, not specified as recurrent: Secondary | ICD-10-CM | POA: Insufficient documentation

## 2024-10-02 DIAGNOSIS — S3993XA Unspecified injury of pelvis, initial encounter: Secondary | ICD-10-CM | POA: Diagnosis not present

## 2024-10-02 DIAGNOSIS — I517 Cardiomegaly: Secondary | ICD-10-CM | POA: Diagnosis not present

## 2024-10-02 DIAGNOSIS — M51369 Other intervertebral disc degeneration, lumbar region without mention of lumbar back pain or lower extremity pain: Secondary | ICD-10-CM | POA: Diagnosis not present

## 2024-10-02 LAB — CBC WITH DIFFERENTIAL/PLATELET
Abs Immature Granulocytes: 0.11 K/uL — ABNORMAL HIGH (ref 0.00–0.07)
Basophils Absolute: 0.1 K/uL (ref 0.0–0.1)
Basophils Relative: 1 %
Eosinophils Absolute: 0.1 K/uL (ref 0.0–0.5)
Eosinophils Relative: 1 %
HCT: 47.6 % (ref 39.0–52.0)
Hemoglobin: 14.9 g/dL (ref 13.0–17.0)
Immature Granulocytes: 1 %
Lymphocytes Relative: 13 %
Lymphs Abs: 1.2 K/uL (ref 0.7–4.0)
MCH: 29.1 pg (ref 26.0–34.0)
MCHC: 31.3 g/dL (ref 30.0–36.0)
MCV: 93 fL (ref 80.0–100.0)
Monocytes Absolute: 0.4 K/uL (ref 0.1–1.0)
Monocytes Relative: 5 %
Neutro Abs: 6.8 K/uL (ref 1.7–7.7)
Neutrophils Relative %: 79 %
Platelets: 158 K/uL (ref 150–400)
RBC: 5.12 MIL/uL (ref 4.22–5.81)
RDW: 14.1 % (ref 11.5–15.5)
WBC: 8.6 K/uL (ref 4.0–10.5)
nRBC: 0 % (ref 0.0–0.2)

## 2024-10-02 LAB — COMPREHENSIVE METABOLIC PANEL WITH GFR
ALT: 14 U/L (ref 0–44)
AST: 25 U/L (ref 15–41)
Albumin: 3.6 g/dL (ref 3.5–5.0)
Alkaline Phosphatase: 62 U/L (ref 38–126)
Anion gap: 11 (ref 5–15)
BUN: 19 mg/dL (ref 8–23)
CO2: 21 mmol/L — ABNORMAL LOW (ref 22–32)
Calcium: 9.4 mg/dL (ref 8.9–10.3)
Chloride: 109 mmol/L (ref 98–111)
Creatinine, Ser: 1.25 mg/dL — ABNORMAL HIGH (ref 0.61–1.24)
GFR, Estimated: 55 mL/min — ABNORMAL LOW (ref >60)
Glucose, Bld: 87 mg/dL (ref 70–99)
Potassium: 4.1 mmol/L (ref 3.5–5.1)
Sodium: 141 mmol/L (ref 135–145)
Total Bilirubin: 1.5 mg/dL — ABNORMAL HIGH (ref 0.0–1.2)
Total Protein: 7.2 g/dL (ref 6.5–8.1)

## 2024-10-02 LAB — I-STAT CHEM 8, ED
BUN: 21 mg/dL (ref 8–23)
Calcium, Ion: 1.29 mmol/L (ref 1.15–1.40)
Chloride: 109 mmol/L (ref 98–111)
Creatinine, Ser: 1.2 mg/dL (ref 0.61–1.24)
Glucose, Bld: 83 mg/dL (ref 70–99)
HCT: 45 % (ref 39.0–52.0)
Hemoglobin: 15.3 g/dL (ref 13.0–17.0)
Potassium: 4 mmol/L (ref 3.5–5.1)
Sodium: 144 mmol/L (ref 135–145)
TCO2: 23 mmol/L (ref 22–32)

## 2024-10-02 LAB — TYPE AND SCREEN
ABO/RH(D): A POS
Antibody Screen: NEGATIVE

## 2024-10-02 LAB — PROTIME-INR
INR: 1.4 — ABNORMAL HIGH (ref 0.8–1.2)
Prothrombin Time: 17.9 s — ABNORMAL HIGH (ref 11.4–15.2)

## 2024-10-02 LAB — ABO/RH: ABO/RH(D): A POS

## 2024-10-02 LAB — APTT: aPTT: 32 s (ref 24–36)

## 2024-10-02 LAB — I-STAT CG4 LACTIC ACID, ED: Lactic Acid, Venous: 1.8 mmol/L (ref 0.5–1.9)

## 2024-10-02 MED ORDER — ACETAMINOPHEN 325 MG PO TABS
975.0000 mg | ORAL_TABLET | Freq: Once | ORAL | Status: AC
  Administered 2024-10-02: 975 mg via ORAL
  Filled 2024-10-02: qty 3

## 2024-10-02 NOTE — ED Notes (Signed)
 EDP placed C-collar onto patient.

## 2024-10-02 NOTE — ED Triage Notes (Addendum)
 Pt was BIB [facility] from home after having a fall. He is on eloquis. He fell back on his tail bone bumped his head and elbow. NO LOC. Denies neck pain. Complains of lower back pain. A&Ox 4  Dr. Pamella at bedside. Denies head pain. Pain is a 3/10 in the back. Hematoma over the right occipital occiput. PERRLA. Midline tenderness. CCollar placed at bedside. Breath sounds equal bilateral. Abdomen soft and non tender. Superficial skin tear over the right elbow. Midline and paralumbar tenderness.. Fast exam negative

## 2024-10-02 NOTE — ED Provider Notes (Signed)
 [place] EMERGENCY DEPARTMENT AT [facility] Provider Note   CSN: [id number] Arrival date & time: 10/02/24  1138     Patient presents with: Maurice Wood is a 89 y.o. male.  With a history of atrial fibrillation on Eliquis  who presents to the ED after a fall.  Patient lost his balance while walking on his back deck tripped and fell backwards striking the back of his head.  No LOC.  Now reporting headache, lower back pain and pain on the right elbow.  No chest pain neck pain shortness of breath nausea vomiting or pain in other extremities.   HPI     Prior to Admission medications   Medication Sig Start Date End Date Taking? Authorizing Provider  albuterol  (VENTOLIN  HFA) 108 (90 Base) MCG/ACT inhaler Inhale 2 puffs into the lungs every 6 (six) hours as needed for wheezing or shortness of breath. 09/17/23   Cobb, Comer GAILS, NP  cholecalciferol (VITAMIN D3) 25 MCG (1000 UNIT) tablet Take 1,000 Units by mouth daily.    [provider]  ELIQUIS  5 MG TABS tablet TAKE 1 TABLET BY MOUTH TWICE A DAY 07/09/24   [name], Toribio SAUNDERS, MD  Fluticasone -Umeclidin-Vilant (TRELEGY ELLIPTA ) 100-62.5-25 MCG/ACT AEPB INHALE 1 PUFF INTO THE LUNGS DAILY IN THE AFTERNOON 08/28/23   Sood, Vineet, MD  furosemide  (LASIX ) 40 MG tablet Take 1 tablet (40 mg total) by mouth 2 (two) times a week. Every Mon and Fri 06/01/24   [name], Daniel R, MD  Multiple Vitamin (MULTIVITAMIN WITH MINERALS) TABS tablet Take 1 tablet by mouth daily.    [provider]  nitroGLYCERIN  (NITROSTAT ) 0.4 MG SL tablet Place 1 tablet (0.4 mg total) under the tongue every 5 (five) minutes as needed for chest pain. 06/12/23 03/22/24  Glena Harlene HERO, FNP  omeprazole  (PRILOSEC) 20 MG capsule Take 1 capsule (20 mg total) by mouth daily. 06/12/23   Milford, Harlene HERO, FNP  potassium chloride  SA (KLOR-CON  M) 20 MEQ tablet Take 2 tablets (40 mEq total) by mouth 2 (two) times a week. Every Mon and Fri 06/01/24    [name], Daniel R, MD  vitamin B-12 (CYANOCOBALAMIN ) 1000 MCG tablet Take 1,000 mcg by mouth daily.    [provider]    Allergies: Nsaids, Iodinated contrast media, and Pneumococcal vaccines    Review of Systems  Updated Vital Signs BP 123/66   Pulse 73   Temp 97.7 F (36.5 C) (Oral)   Resp 18   Ht 5' 10 (1.778 m)   Wt 77.1 kg   SpO2 95%   BMI 24.39 kg/m   Physical Exam Vitals and nursing note reviewed.  HENT:     Head:     Comments: Small hematoma over right posterior aspect no active bleeding Eyes:     Pupils: Pupils are equal, round, and reactive to light.  Neck:     Comments: Midline cervical tenderness without step-off deformity Cardiovascular:     Rate and Rhythm: Normal rate and regular rhythm.  Pulmonary:     Effort: Pulmonary effort is normal.     Breath sounds: Normal breath sounds.  Abdominal:     Palpations: Abdomen is soft.     Tenderness: There is no abdominal tenderness.  Musculoskeletal:     Cervical back: Neck supple. Tenderness present.     Comments: Skin avulsion over right lateral elbow No bony deformity 5 out of 5 motor strength bilateral upper and lower extremities with sensation tact light touch throughout  Skin:    General: Skin is warm and dry.  Neurological:     Mental Status: He is alert.  Psychiatric:        Mood and Affect: Mood normal.     (all labs ordered are listed, but only abnormal results are displayed) Labs Reviewed  COMPREHENSIVE METABOLIC PANEL WITH GFR - Abnormal; Notable for the following components:      Result Value   CO2 21 (*)    Creatinine, Ser 1.25 (*)    Total Bilirubin 1.5 (*)    GFR, Estimated 55 (*)    All other components within normal limits  PROTIME-INR - Abnormal; Notable for the following components:   Prothrombin Time 17.9 (*)    INR 1.4 (*)    All other components within normal limits  CBC WITH DIFFERENTIAL/PLATELET - Abnormal; Notable for the following components:   Abs Immature  Granulocytes 0.11 (*)    All other components within normal limits  APTT  I-STAT CHEM 8, ED  I-STAT CG4 LACTIC ACID, ED  TYPE AND SCREEN  ABO/RH    EKG: EKG Interpretation Date/Time:  Friday October 02 2024 13:56:31 EDT Ventricular Rate:  71 PR Interval:    QRS Duration:  90 QT Interval:  408 QTC Calculation: 444 R Axis:   -9  Text Interpretation: Atrial fibrillation Ventricular premature complex Confirmed by Pamella Sharper [id number]) on 10/02/2024 2:00:38 PM  Radiology: CT CERVICAL SPINE WO CONTRAST Result Date: 10/02/2024 EXAM: CT CERVICAL SPINE WITHOUT CONTRAST 10/02/2024 12:48:53 PM TECHNIQUE: CT of the cervical spine was performed without the administration of intravenous contrast. Multiplanar reformatted images are provided for review. Automated exposure control, iterative reconstruction, and/or weight based adjustment of the mA/kV was utilized to reduce the radiation dose to as low as reasonably achievable. COMPARISON: CT cervical spine 06/07/2022. CLINICAL HISTORY: Polytrauma, blunt. FINDINGS: CERVICAL SPINE: BONES AND ALIGNMENT: Straightening of the normal cervical lordosis. Spondylolisthesis of C5 on C6 and trace anterolisthesis of C7 on T1, likely related to degenerative changes. No evidence of traumatic malalignment. DEGENERATIVE CHANGES: Degenerative endplate osteophytes most pronounced in the lower cervical spine. Disc osteophyte complexes at multiple levels. Disc osteophyte complex at C5-C6 eccentric to the left resulting in at least mild spinal canal stenosis. Additional mild spinal canal stenosis at C6-C7. Severe disc space narrowing at C5-C6 and C6-C7. Facet arthrosis and uncovertebral hypertrophy at multiple levels. There is foraminal stenosis throughout the cervical spine, most pronounced on the left at C6-C7. SOFT TISSUES: No prevertebral soft tissue swelling. Prominent calcified atherosclerosis at the left carotid bifurcation. Emphysema in the lung apices. IMPRESSION: 1. No  acute traumatic abnormality of the cervical spine. 2. Degenerative changes as above. 3. Emphysema. 4. Prominent calcified atherosclerosis at the left carotid bifurcation. Electronically signed by: Donnice Mania MD 10/02/2024 01:13 PM EDT RP Workstation: [id number]   CT L-SPINE NO CHARGE Result Date: 10/02/2024 CLINICAL DATA:  Fall. EXAM: CT LUMBAR SPINE WITHOUT CONTRAST TECHNIQUE: Multidetector CT imaging of the lumbar spine was performed without intravenous contrast administration. Multiplanar CT image reconstructions were also generated. RADIATION DOSE REDUCTION: This exam was performed according to the departmental dose-optimization program which includes automated exposure control, adjustment of the mA and/or kV according to patient size and/or use of iterative reconstruction technique. COMPARISON:  09/25/2023 FINDINGS: Segmentation: 5 lumbar type vertebrae. Alignment: No subluxation Vertebrae: Mild compression fracture through the superior endplate of L1 with possible slight compression fracture through the superior endplate of L2. These appear stable since previous study. No acute fracture. Paraspinal and other  soft tissues: Negative Disc levels: Diffuse degenerative disc disease, most pronounced at L2-3. Diffuse degenerative facet disease. IMPRESSION: Stable appearance of chronic compression fractures through the superior endplate of L1 and L2. No acute bony abnormality. Diffuse degenerative disc and facet disease. Electronically Signed   By: Franky Crease M.D.   On: 10/02/2024 13:12   CT ABDOMEN PELVIS WO CONTRAST Result Date: 10/02/2024 CLINICAL DATA:  Abdominal trauma EXAM: CT ABDOMEN AND PELVIS WITHOUT CONTRAST TECHNIQUE: Multidetector CT imaging of the abdomen and pelvis was performed following the standard protocol without IV contrast. RADIATION DOSE REDUCTION: This exam was performed according to the departmental dose-optimization program which includes automated exposure control, adjustment of  the mA and/or kV according to patient size and/or use of iterative reconstruction technique. COMPARISON:  CT pelvis 09/25/2023. FINDINGS: Lower chest: Moderate-sized hiatal hernia. Scarring in the lung bases. No acute findings. No effusions. Hepatobiliary: No focal hepatic abnormality or evidence of a hepatic injury/perihepatic hematoma. Prior cholecystectomy. Pancreas: No focal abnormality or ductal dilatation. Spleen: No splenic injury or perisplenic hematoma. Scattered calcifications. Adrenals/Urinary Tract: No adrenal hemorrhage or renal injury identified. Bladder is unremarkable. Bilateral nephrolithiasis. No hydronephrosis. Stomach/Bowel: Scattered colonic diverticulosis. No active diverticulitis. Stomach and small bowel decompressed. No bowel obstruction or inflammatory process. Vascular/Lymphatic: Aortoiliac atherosclerosis. No evidence of aneurysm or adenopathy. Reproductive: No visible focal abnormality. Other: No free fluid or free air. Moderate right inguinal hernia and large left inguinal hernia, both containing fat. Musculoskeletal: Mild compression fracture through the superior endplate of L1 with slight compression fracture through the superior endplate of L2. These are stable since prior study. No acute fracture seen. Diffuse degenerative disc and facet disease. IMPRESSION: No acute findings or evidence of significant traumatic injury in the abdomen or pelvis. Moderate-sized hiatal hernia. Bilateral nephrolithiasis.  No hydronephrosis. Bilateral inguinal hernias containing fat, left larger than right. Aortoiliac atherosclerosis. Electronically Signed   By: Franky Crease M.D.   On: 10/02/2024 13:10   CT HEAD WO CONTRAST Result Date: 10/02/2024 EXAM: CT HEAD WITHOUT CONTRAST 10/02/2024 12:48:53 PM TECHNIQUE: CT of the head was performed without the administration of intravenous contrast. Automated exposure control, iterative reconstruction, and/or weight based adjustment of the mA/kV was utilized  to reduce the radiation dose to as low as reasonably achievable. COMPARISON: 09/25/2023 CLINICAL HISTORY: Head trauma, moderate-severe. FINDINGS: BRAIN AND VENTRICLES: No acute hemorrhage. No evidence of acute infarct. No hydrocephalus. No extra-axial collection. No mass effect or midline shift. Periventricular and subcortical white matter hypoattenuation, consistent with chronic microvascular ischemic changes. Remote left cerebellar infarcts. Ventricular prominence suggesting underlying parenchymal volume loss. Atherosclerotic calcifications of carotid siphons and intracranial vertebral arteries. ORBITS: No acute abnormality. Bilateral lens replacement. SINUSES: No acute abnormality. SOFT TISSUES AND SKULL: No acute soft tissue abnormality. No skull fracture. IMPRESSION: 1. No acute intracranial abnormality related to head trauma. 2. Remote left cerebellar infarcts. 3. Chronic microvascular ischemic changes. 4. Mild parenchymal volume loss. Electronically signed by: Donnice Mania MD 10/02/2024 01:08 PM EDT RP Workstation: [id number]   DG Pelvis Portable Result Date: 10/02/2024 CLINICAL DATA:  Trauma EXAM: PORTABLE PELVIS 1-2 VIEWS COMPARISON:  09/25/2023 FINDINGS: No evidence of pelvic fracture or diastasis.No acute hip fracture or dislocation.There is no evidence of arthropathy or other focal bone abnormality. Sacral nerve stimulator device along the left abdomen with a single lead terminating in the region of S2. IMPRESSION: No acute fracture, pelvic bone diastasis, or dislocation. Electronically Signed   By: Rogelia Myers M.D.   On: 10/02/2024 13:08   DG Chest  Port 1 View Result Date: 10/02/2024 CLINICAL DATA:  Trauma , fall, low back pain EXAM: DG CHEST 1V PORT COMPARISON:  08/02/2024 FINDINGS: Coarsening of the pulmonary interstitium without overt pulmonary edema, likely due to underlying emphysema. Irregular reticular opacities again noted in the lung bases. No focal airspace consolidation, pleural  effusion, or pneumothorax. Moderate cardiomegaly. Tortuous aorta with aortic atherosclerosis. No acute fracture or destructive lesions. Multilevel thoracic osteophytosis. IMPRESSION: Emphysema. No pneumonia or pulmonary edema. Electronically Signed   By: Rogelia Myers M.D.   On: 10/02/2024 13:05   DG Elbow Complete Right Result Date: 10/02/2024 CLINICAL DATA:  Blunt Trauma, fall, superficial skin tear over the right elbow. EXAM: RIGHT ELBOW - COMPLETE 3+ VIEW COMPARISON:  None Available. FINDINGS: No acute fracture or dislocation. No joint effusion. There is no evidence of arthropathy or other focal bone abnormality. Soft tissue irregularity overlying the olecranon likely reflecting patient's known skin tear. No radiopaque foreign body. IMPRESSION: No acute fracture or dislocation. Electronically Signed   By: Rogelia Myers M.D.   On: 10/02/2024 12:58     Ultrasound ED FAST  Date/Time: 10/02/2024 2:37 PM  Performed by: Pamella Ozell LABOR, DO Authorized by: Pamella Ozell LABOR, DO  Procedure details:    Indications: blunt abdominal trauma and blunt chest trauma       Assess for:  Hemothorax, intra-abdominal fluid, pericardial effusion and pneumothorax    Technique:  Cardiac, abdominal and chest    Images: archived      Abdominal findings:    L kidney:  Visualized   R kidney:  Visualized   Liver:  Visualized    Bladder:  Visualized, Foley catheter not visualized   Hepatorenal space visualized: identified     Splenorenal space: identified     Rectovesical free fluid: not identified     Splenorenal free fluid: not identified     Hepatorenal space free fluid: not identified   Cardiac findings:    Heart:  Visualized   Wall motion: identified     Pericardial effusion: not identified   Chest findings:    L lung sliding: identified     R lung sliding: identified     Fluid in thorax: not identified   Comments:     E-FAST negative    Medications Ordered in the ED  acetaminophen   (TYLENOL ) tablet 975 mg (975 mg Oral Given 10/02/24 1406)                                    Medical Decision Making 89 year old male with history of atrial fibrillation on Eliquis  who presented to ED after mechanical fall.  Positive head trauma.  No LOC.  Level 2 trauma.  Now with headache right elbow pain with skin avulsion and lower back pain.  E-FAST negative hemodynamically stable well-appearing overall.  Tetanus is up-to-date.  Traumatic workup negative including CT head C-spine lumbar spine abdomen pelvis.  Stable for discharge.  Amount and/or Complexity of Data Reviewed Labs: ordered. Radiology: ordered.  Risk OTC drugs.        Final diagnoses:  Injury of head, initial encounter  Avulsion of skin of right elbow, initial encounter    ED Discharge Orders     None          Pamella Ozell LABOR, DO 10/02/24 1438

## 2024-10-02 NOTE — Progress Notes (Signed)
 Supported pt. Fall on Th[place]. Chaplain available as needed.  Rayleen Dade, [place], [facility], Pager [phone]

## 2024-10-02 NOTE — Discharge Instructions (Signed)
 You were seen in the emerged part for injuries after fall The CAT scan of your head and neck looked okay There is no bleeding or brain or broken bones Your elbow is not broken Your tetanus was up-to-date Continue take all previous prescribed occasions Return to the emergency room for severe pain or any other concerns Otherwise follow-up with your primary doctor within 1 week for reevaluation

## 2024-10-02 NOTE — ED Notes (Signed)
Patient placed onto cardiac monitor

## 2024-10-02 NOTE — ED Notes (Signed)
 CCMD contacted to monitor pt

## 2024-10-02 NOTE — Progress Notes (Signed)
 Orthopedic Tech Progress Note Patient Details:  Maurice Wood [birth year] [id number]  Patient ID: [name], male   DOB: 04-Jun-1933, 89 y.o.   MRN: [id number] Level 2 trauma [name] 10/02/2024, 11:44 AM

## 2024-10-09 DIAGNOSIS — W19XXXA Unspecified fall, initial encounter: Secondary | ICD-10-CM | POA: Diagnosis not present

## 2024-10-09 DIAGNOSIS — M5416 Radiculopathy, lumbar region: Secondary | ICD-10-CM | POA: Diagnosis not present

## 2024-10-09 DIAGNOSIS — S3091XA Unspecified superficial injury of lower back and pelvis, initial encounter: Secondary | ICD-10-CM | POA: Diagnosis not present

## 2024-10-09 DIAGNOSIS — S0093XA Contusion of unspecified part of head, initial encounter: Secondary | ICD-10-CM | POA: Diagnosis not present

## 2024-10-09 DIAGNOSIS — N179 Acute kidney failure, unspecified: Secondary | ICD-10-CM | POA: Diagnosis not present

## 2024-10-18 ENCOUNTER — Emergency Department (HOSPITAL_BASED_OUTPATIENT_CLINIC_OR_DEPARTMENT_OTHER)
Admission: EM | Admit: 2024-10-18 | Discharge: 2024-10-18 | Disposition: A | Discharge status: Home / Self Care | Attending: Emergency Medicine | Admitting: Emergency Medicine

## 2024-10-18 ENCOUNTER — Other Ambulatory Visit: Payer: Self-pay

## 2024-10-18 ENCOUNTER — Emergency Department (HOSPITAL_BASED_OUTPATIENT_CLINIC_OR_DEPARTMENT_OTHER)

## 2024-10-18 ENCOUNTER — Encounter (HOSPITAL_BASED_OUTPATIENT_CLINIC_OR_DEPARTMENT_OTHER): Payer: Self-pay

## 2024-10-18 DIAGNOSIS — J449 Chronic obstructive pulmonary disease, unspecified: Secondary | ICD-10-CM | POA: Insufficient documentation

## 2024-10-18 DIAGNOSIS — N201 Calculus of ureter: Secondary | ICD-10-CM | POA: Diagnosis not present

## 2024-10-18 DIAGNOSIS — W19XXXA Unspecified fall, initial encounter: Secondary | ICD-10-CM | POA: Diagnosis not present

## 2024-10-18 DIAGNOSIS — I509 Heart failure, unspecified: Secondary | ICD-10-CM | POA: Diagnosis not present

## 2024-10-18 DIAGNOSIS — S32028A Other fracture of second lumbar vertebra, initial encounter for closed fracture: Secondary | ICD-10-CM | POA: Diagnosis not present

## 2024-10-18 DIAGNOSIS — R399 Unspecified symptoms and signs involving the genitourinary system: Secondary | ICD-10-CM | POA: Diagnosis not present

## 2024-10-18 DIAGNOSIS — R110 Nausea: Secondary | ICD-10-CM | POA: Diagnosis not present

## 2024-10-18 DIAGNOSIS — S3992XA Unspecified injury of lower back, initial encounter: Secondary | ICD-10-CM | POA: Diagnosis present

## 2024-10-18 DIAGNOSIS — I4891 Unspecified atrial fibrillation: Secondary | ICD-10-CM | POA: Insufficient documentation

## 2024-10-18 DIAGNOSIS — Z7951 Long term (current) use of inhaled steroids: Secondary | ICD-10-CM | POA: Insufficient documentation

## 2024-10-18 DIAGNOSIS — N23 Unspecified renal colic: Secondary | ICD-10-CM

## 2024-10-18 DIAGNOSIS — S32020A Wedge compression fracture of second lumbar vertebra, initial encounter for closed fracture: Secondary | ICD-10-CM | POA: Diagnosis not present

## 2024-10-18 DIAGNOSIS — N200 Calculus of kidney: Secondary | ICD-10-CM | POA: Diagnosis not present

## 2024-10-18 DIAGNOSIS — N281 Cyst of kidney, acquired: Secondary | ICD-10-CM | POA: Diagnosis not present

## 2024-10-18 DIAGNOSIS — R10A3 Flank pain, bilateral: Secondary | ICD-10-CM | POA: Diagnosis not present

## 2024-10-18 DIAGNOSIS — R319 Hematuria, unspecified: Secondary | ICD-10-CM | POA: Diagnosis not present

## 2024-10-18 DIAGNOSIS — N1330 Unspecified hydronephrosis: Secondary | ICD-10-CM | POA: Diagnosis not present

## 2024-10-18 DIAGNOSIS — Z7901 Long term (current) use of anticoagulants: Secondary | ICD-10-CM | POA: Insufficient documentation

## 2024-10-18 DIAGNOSIS — N202 Calculus of kidney with calculus of ureter: Secondary | ICD-10-CM | POA: Insufficient documentation

## 2024-10-18 LAB — HEPATIC FUNCTION PANEL
ALT: 18 U/L (ref 0–44)
AST: 18 U/L (ref 15–41)
Albumin: 4.2 g/dL (ref 3.5–5.0)
Alkaline Phosphatase: 142 U/L — ABNORMAL HIGH (ref 38–126)
Bilirubin, Direct: 0.6 mg/dL — ABNORMAL HIGH (ref 0.0–0.2)
Indirect Bilirubin: 1 mg/dL — ABNORMAL HIGH (ref 0.3–0.9)
Total Bilirubin: 1.6 mg/dL — ABNORMAL HIGH (ref 0.0–1.2)
Total Protein: 7.3 g/dL (ref 6.5–8.1)

## 2024-10-18 LAB — URINALYSIS, ROUTINE W REFLEX MICROSCOPIC
Bacteria, UA: NONE SEEN
Bilirubin Urine: NEGATIVE
Glucose, UA: NEGATIVE mg/dL
Nitrite: NEGATIVE
Protein, ur: 30 mg/dL — AB
RBC / HPF: >50 RBC/hpf (ref 0–5)
Specific Gravity, Urine: 1.03 (ref 1.005–1.030)
WBC, UA: >50 WBC/hpf (ref 0–5)
pH: 5.5 (ref 5.0–8.0)

## 2024-10-18 LAB — CBC
HCT: 46.1 % (ref 39.0–52.0)
Hemoglobin: 14.7 g/dL (ref 13.0–17.0)
MCH: 28.8 pg (ref 26.0–34.0)
MCHC: 31.9 g/dL (ref 30.0–36.0)
MCV: 90.4 fL (ref 80.0–100.0)
Platelets: 239 K/uL (ref 150–400)
RBC: 5.1 MIL/uL (ref 4.22–5.81)
RDW: 14.6 % (ref 11.5–15.5)
WBC: 11.2 K/uL — ABNORMAL HIGH (ref 4.0–10.5)
nRBC: 0 % (ref 0.0–0.2)

## 2024-10-18 LAB — BASIC METABOLIC PANEL WITH GFR
Anion gap: 12 (ref 5–15)
BUN: 27 mg/dL — ABNORMAL HIGH (ref 8–23)
CO2: 25 mmol/L (ref 22–32)
Calcium: 10.1 mg/dL (ref 8.9–10.3)
Chloride: 101 mmol/L (ref 98–111)
Creatinine, Ser: 1.25 mg/dL — ABNORMAL HIGH (ref 0.61–1.24)
GFR, Estimated: 55 mL/min — ABNORMAL LOW (ref >60)
Glucose, Bld: 125 mg/dL — ABNORMAL HIGH (ref 70–99)
Potassium: 3.8 mmol/L (ref 3.5–5.1)
Sodium: 138 mmol/L (ref 135–145)

## 2024-10-18 MED ORDER — HYDROCODONE-ACETAMINOPHEN 5-325 MG PO TABS: 1.0000 | ORAL_TABLET | ORAL | 0 refills | Status: AC | PRN

## 2024-10-18 MED ORDER — SODIUM CHLORIDE 0.9 % IV BOLUS
500.0000 mL | Freq: Once | INTRAVENOUS | Status: AC
Start: 2024-10-18 — End: 2024-10-18
  Administered 2024-10-18: 500 mL via INTRAVENOUS

## 2024-10-18 MED ORDER — ONDANSETRON 4 MG PO TBDP: 4.0000 mg | ORAL_TABLET | Freq: Three times a day (TID) | ORAL | 0 refills | Status: AC | PRN

## 2024-10-18 MED ORDER — TAMSULOSIN HCL 0.4 MG PO CAPS: 0.4000 mg | ORAL_CAPSULE | Freq: Every day | ORAL | 0 refills | Status: DC

## 2024-10-18 MED ORDER — FLUCONAZOLE 150 MG PO TABS
150.0000 mg | ORAL_TABLET | Freq: Once | ORAL | Status: AC
  Administered 2024-10-18: 150 mg via ORAL
  Filled 2024-10-18: qty 1

## 2024-10-18 NOTE — ED Provider Notes (Signed)
 [name] [facility] DE[facility]MENT AT [facility] Provider Note   CSN: [id number] Arrival date & time: 10/18/24  1146     Patient presents with: Hematuria and Flank Pain   Maurice Wood is a 89 y.o. male.   Pt is an 89 yo male with pmhx significant for afib (on Eliquis ), sleep apnea, IBS, kidney stones, chf, and copd.  Pt is here with back pain and hematuria.  He denies any pain with urination.  He did fall a few weeks ago.  He was seen at [facility] on 10/24 and had trauma scans done which did not show anything acute.  He is worried back pain is from that fall.       Prior to Admission medications   Medication Sig Start Date End Date Taking? Authorizing Provider  HYDROcodone -acetaminophen  (NORCO/VICODIN) 5-325 MG tablet Take 1 tablet by mouth every 4 (four) hours as needed. 10/18/24  Yes Dean Clarity, MD  ondansetron  (ZOFRAN -ODT) 4 MG disintegrating tablet Take 1 tablet (4 mg total) by mouth every 8 (eight) hours as needed for nausea or vomiting. 10/18/24  Yes Dean Clarity, MD  tamsulosin  (FLOMAX ) 0.4 MG CAPS capsule Take 1 capsule (0.4 mg total) by mouth daily. 10/18/24  Yes Dean Clarity, MD  albuterol  (VENTOLIN  HFA) 108 (90 Base) MCG/ACT inhaler Inhale 2 puffs into the lungs every 6 (six) hours as needed for wheezing or shortness of breath. 09/17/23   Cobb, Comer GAILS, NP  cholecalciferol (VITAMIN D3) 25 MCG (1000 UNIT) tablet Take 1,000 Units by mouth daily.    [provider]  ELIQUIS  5 MG TABS tablet TAKE 1 TABLET BY MOUTH TWICE A DAY 07/09/24   [name], Toribio SAUNDERS, MD  Fluticasone -Umeclidin-Vilant (TRELEGY ELLIPTA ) 100-62.5-25 MCG/ACT AEPB INHALE 1 PUFF INTO THE LUNGS DAILY IN THE AFTERNOON 08/28/23   Sood, Vineet, MD  furosemide  (LASIX ) 40 MG tablet Take 1 tablet (40 mg total) by mouth 2 (two) times a week. Every Mon and Fri 06/01/24   [name], Daniel R, MD  Multiple Vitamin (MULTIVITAMIN WITH MINERALS) TABS tablet Take 1 tablet by mouth daily.    [provider]  nitroGLYCERIN  (NITROSTAT ) 0.4 MG SL tablet Place 1 tablet (0.4 mg total) under the tongue every 5 (five) minutes as needed for chest pain. 06/12/23 03/22/24  Glena Harlene HERO, FNP  omeprazole  (PRILOSEC) 20 MG capsule Take 1 capsule (20 mg total) by mouth daily. 06/12/23   Milford, Harlene HERO, FNP  potassium chloride  SA (KLOR-CON  M) 20 MEQ tablet Take 2 tablets (40 mEq total) by mouth 2 (two) times a week. Every Mon and Fri 06/01/24   [name], Daniel R, MD  vitamin B-12 (CYANOCOBALAMIN ) 1000 MCG tablet Take 1,000 mcg by mouth daily.    [provider]    Allergies: Nsaids, Iodinated contrast media, and Pneumococcal vaccines    Review of Systems  Genitourinary:  Positive for hematuria.  Musculoskeletal:  Positive for back pain.  All other systems reviewed and are negative.   Updated Vital Signs BP 115/68   Pulse 64   Temp 97.7 F (36.5 C) (Oral)   Resp 16   Ht 5' 10 (1.778 m)   Wt 74.8 kg   SpO2 97%   BMI 23.68 kg/m   Physical Exam Vitals and nursing note reviewed.  Constitutional:      Appearance: Normal appearance.  HENT:     Head: Normocephalic and atraumatic.     Right Ear: External ear normal.     Left Ear: External ear normal.  Nose: Nose normal.     Mouth/Throat:     Mouth: Mucous membranes are dry.  Eyes:     Extraocular Movements: Extraocular movements intact.     Conjunctiva/sclera: Conjunctivae normal.     Pupils: Pupils are equal, round, and reactive to light.  Cardiovascular:     Rate and Rhythm: Normal rate and regular rhythm.     Pulses: Normal pulses.     Heart sounds: Normal heart sounds.  Pulmonary:     Effort: Pulmonary effort is normal.     Breath sounds: Normal breath sounds.  Abdominal:     General: Abdomen is flat. Bowel sounds are normal.     Palpations: Abdomen is soft.  Musculoskeletal:        General: Normal range of motion.     Cervical back: Normal range of motion and neck supple.  Skin:    General: Skin is  warm.     Capillary Refill: Capillary refill takes less than 2 seconds.  Neurological:     General: No focal deficit present.     Mental Status: He is alert and oriented to person, place, and time.  Psychiatric:        Mood and Affect: Mood normal.        Behavior: Behavior normal.     (all labs ordered are listed, but only abnormal results are displayed) Labs Reviewed  URINALYSIS, ROUTINE W REFLEX MICROSCOPIC - Abnormal; Notable for the following components:      Result Value   Color, Urine ORANGE (*)    APPearance CLOUDY (*)    Hgb urine dipstick LARGE (*)    Ketones, ur TRACE (*)    Protein, ur 30 (*)    Leukocytes,Ua TRACE (*)    All other components within normal limits  CBC - Abnormal; Notable for the following components:   WBC 11.2 (*)    All other components within normal limits  BASIC METABOLIC PANEL WITH GFR - Abnormal; Notable for the following components:   Glucose, Bld 125 (*)    BUN 27 (*)    Creatinine, Ser 1.25 (*)    GFR, Estimated 55 (*)    All other components within normal limits  HEPATIC FUNCTION PANEL - Abnormal; Notable for the following components:   Alkaline Phosphatase 142 (*)    Total Bilirubin 1.6 (*)    Bilirubin, Direct 0.6 (*)    Indirect Bilirubin 1.0 (*)    All other components within normal limits  URINE CULTURE    EKG: None  Radiology: CT Renal Stone Study Result Date: 10/18/2024 EXAM: CT UROGRAM 10/18/2024 12:39:09 PM TECHNIQUE: CT of the abdomen and pelvis was performed without the administration of intravenous contrast as per CT urogram protocol. Multiplanar reformatted images as well as MIP urogram images are provided for review. Automated exposure control, iterative reconstruction, and/or weight based adjustment of the mA/kV was utilized to reduce the radiation dose to as low as reasonably achievable. COMPARISON: 10/02/2024 CLINICAL HISTORY: Abdominal/flank pain, stone suspected. FINDINGS: LOWER CHEST: Interstitial reticulation,  scarring, and architectural distortion noted within the imaged portions of the lung bases. Calcified granuloma noted within the left lower lobe. Unchanged peripheral nodule in the right middle lobe measuring 4 mm, stable since 2021, compatible with a benign nodule requiring no further follow-up. Subpleural nodule in the left base is also unchanged from 2021, measuring 3 mm and requires no further follow-up. Moderate-sized hiatal hernia. LIVER: Lobulated contour of the liver suggestive of cirrhosis. A few scattered calcified granulomas  noted within the liver. GALLBLADDER AND BILE DUCTS: Status post cholecystectomy. No biliary ductal dilatation. SPLEEN: Calcified granulomas noted within the spleen. PANCREAS: The pancreas is normal in size and contour without focal lesion or ductal dilatation. ADRENAL GLANDS: No acute abnormality. KIDNEYS, URETERS AND BLADDER: Nonobstructing stone within the inferior pole of the right kidney measuring 3 mm. Left pelvic kidney is identified. Stone within the interpolar left kidney measures 5 mm. Stone at the left UPJ is noted measuring 1.0 x 0.7 cm, image 52/2. Exophytic cyst of the lateral cortex of the left kidney measures 1.3 cm, image 51/2. This is compatible with a benign bosniak class 1 cyst. No follow-up imaging recommended. Similar appearance of left-sided perinephric soft tissue stranding. No significant hydronephrosis. Partially decompressed urinary bladder. Dome of the bladder extends into fat-containing right inguinal hernia. Prostate gland is unremarkable. GI AND BOWEL: Moderate-sized hiatal hernia. Moderate stool burden noted within the transverse and descending colon. Bilateral fat-containing inguinal hernias, left greater than right. Stomach demonstrates no acute abnormality. There is no bowel obstruction. PERITONEUM AND RETROPERITONEUM: No free fluid or fluid collections within the abdomen or pelvis. No free air. VASCULATURE: Aorta is normal in caliber. Aortic  atherosclerotic calcifications. LYMPH NODES: No lymphadenopathy. REPRODUCTIVE ORGANS: Prostate gland is unremarkable. BONES AND SOFT TISSUES: Lumbar spondylosis. Schmorl's node deformity is identified along the inferior endplate of the L2 vertebra, unchanged from previous examination. Stable mild superior endplate fracture deformity involving the L1 vertebral body. New mild superior endplate deformity is noted involving the L2 vertebral body with mild loss of the vertebral body height. No retropulsion of fracture fragments into the canal. Sacral nerve root stimulator is identified with generator battery pack in the subcutaneous soft tissues overlying the left posterior pelvis. IMPRESSION: 1. Left UPJ stone measuring 1.0 x 0.7 cm, new from previous exam without significant change hydronephrosis. 2. Bilateral nephrolithiasis. 3. Lobulated liver contour suggestive of cirrhosis. 4. New from 10/02/2024, age indeterminate mild superior endplate compression deformity is identified involving the L2 vertebra without retropulsion of fracture fragments. Electronically signed by: Waddell Calk MD 10/18/2024 01:27 PM EST RP Workstation: [id number]     Procedures   Medications Ordered in the ED  fluconazole (DIFLUCAN) tablet 150 mg (has no administration in time range)  sodium chloride  0.9 % bolus 500 mL (0 mLs Intravenous Stopped 10/18/24 1337)                                    Medical Decision Making Amount and/or Complexity of Data Reviewed Labs: ordered. Radiology: ordered.   This patient presents to the ED for concern of back pain, hematuria, this involves an extensive number of treatment options, and is a complaint that carries with it a high risk of complications and morbidity.  The differential diagnosis includes kidney stone, uti, trauma, eliquis    Co morbidities that complicate the patient evaluation  afib (on Eliquis ), sleep apnea, IBS, kidney stones, chf, and copd   Additional history  obtained:  Additional history obtained from epic chart review External records from outside source obtained and reviewed including wife   Lab Tests:  I Ordered, and personally interpreted labs.  The pertinent results include:  cbc with nl h/h; wbc sl elevated at 11.2; ua with >50 rbcs/wbcs, protein and ketones, lg hgb; yeast; lfts with tb sl elevated at 1.6; bmp with bun 27 and cr 1.25 (1.2 on 10/24)   Imaging Studies ordered:  I ordered  imaging studies including ct renal  I independently visualized and interpreted imaging which showed   Left UPJ stone measuring 1.0 x 0.7 cm, new from previous exam without  significant change hydronephrosis.  2. Bilateral nephrolithiasis.  3. Lobulated liver contour suggestive of cirrhosis.  4. New from 10/02/2024, age indeterminate mild superior endplate compression  deformity is identified involving the L2 vertebra without retropulsion of  fracture fragments.   I agree with the radiologist interpretation   Medicines ordered and prescription drug management:  I ordered medication including ivfs  for sx  Reevaluation of the patient after these medicines showed that the patient improved I have reviewed the patients home medicines and have made adjustments as needed   Test Considered:  ct   Critical Interventions:  ivfs   Consultations Obtained:  I requested consultation with the urologist (Dr. Carolee),  and discussed lab and imaging findings as well as pertinent plan - he does not think pt needs a stent as he is afebrile and has no significant elevation in wbc.  He recommends outpatient f/u.  If pt develops a fever, he needs to go to [facility].   Problem List / ED Course:  L UPJ stone:  pt has not wanted anything for pain while here.  He does want something for home.  He does know to go to [facility] if he develops a fever.  He is to f/u with urology as an outpatient. L2 compression fx:  likely from fall on 10/24.    Reevaluation:  After the  interventions noted above, I reevaluated the patient and found that they have :improved   Social Determinants of Health:  Lives at home   Dispostion:  After consideration of the diagnostic results and the patients response to treatment, I feel that the patent would benefit from discharge with outpatient f/u.       Final diagnoses:  Left ureteral stone  Closed compression fracture of L2 lumbar vertebra, initial encounter [facility])  Renal colic on left side    ED Discharge Orders          Ordered    tamsulosin  (FLOMAX ) 0.4 MG CAPS capsule  Daily        10/18/24 1343    HYDROcodone -acetaminophen  (NORCO/VICODIN) 5-325 MG tablet  Every 4 hours PRN        10/18/24 1343    ondansetron  (ZOFRAN -ODT) 4 MG disintegrating tablet  Every 8 hours PRN        10/18/24 1343               Dean Clarity, MD 10/18/24 1346

## 2024-10-18 NOTE — ED Triage Notes (Signed)
 Pt reports hematuria starting yesterday. Pt concerned about possible UTI or kidney stone.

## 2024-10-19 LAB — URINE CULTURE: Culture: <10000 — AB

## 2024-10-22 ENCOUNTER — Ambulatory Visit (HOSPITAL_COMMUNITY)
Admission: RE | Admit: 2024-10-22 | Discharge: 2024-10-22 | Disposition: A | Discharge status: Home / Self Care | Source: Ambulatory Visit | Attending: Internal Medicine | Admitting: Internal Medicine

## 2024-10-22 VITALS — BP 110/70 | HR 95 | Wt 159.8 lb

## 2024-10-22 DIAGNOSIS — Z7901 Long term (current) use of anticoagulants: Secondary | ICD-10-CM | POA: Diagnosis not present

## 2024-10-22 DIAGNOSIS — G4733 Obstructive sleep apnea (adult) (pediatric): Secondary | ICD-10-CM | POA: Diagnosis not present

## 2024-10-22 DIAGNOSIS — I110 Hypertensive heart disease with heart failure: Secondary | ICD-10-CM | POA: Insufficient documentation

## 2024-10-22 DIAGNOSIS — R918 Other nonspecific abnormal finding of lung field: Secondary | ICD-10-CM | POA: Insufficient documentation

## 2024-10-22 DIAGNOSIS — N390 Urinary tract infection, site not specified: Secondary | ICD-10-CM | POA: Insufficient documentation

## 2024-10-22 DIAGNOSIS — I77819 Aortic ectasia, unspecified site: Secondary | ICD-10-CM | POA: Insufficient documentation

## 2024-10-22 DIAGNOSIS — Z66 Do not resuscitate: Secondary | ICD-10-CM | POA: Insufficient documentation

## 2024-10-22 DIAGNOSIS — I671 Cerebral aneurysm, nonruptured: Secondary | ICD-10-CM | POA: Insufficient documentation

## 2024-10-22 DIAGNOSIS — I482 Chronic atrial fibrillation, unspecified: Secondary | ICD-10-CM | POA: Diagnosis not present

## 2024-10-22 DIAGNOSIS — I5032 Chronic diastolic (congestive) heart failure: Secondary | ICD-10-CM | POA: Diagnosis not present

## 2024-10-22 DIAGNOSIS — J449 Chronic obstructive pulmonary disease, unspecified: Secondary | ICD-10-CM | POA: Insufficient documentation

## 2024-10-22 DIAGNOSIS — I4821 Permanent atrial fibrillation: Secondary | ICD-10-CM | POA: Diagnosis not present

## 2024-10-22 DIAGNOSIS — I77810 Thoracic aortic ectasia: Secondary | ICD-10-CM

## 2024-10-22 DIAGNOSIS — Z79899 Other long term (current) drug therapy: Secondary | ICD-10-CM | POA: Insufficient documentation

## 2024-10-22 MED ORDER — FUROSEMIDE 40 MG PO TABS: 40.0000 mg | ORAL_TABLET | ORAL | Status: AC

## 2024-10-22 NOTE — Addendum Note (Signed)
 Encounter addended by: Irelynd Zumstein B, RN on: 10/22/2024 11:06 AM  Actions taken: Clinical Note Signed, Order list changed

## 2024-10-22 NOTE — Patient Instructions (Signed)
 Medication Changes:  ONLY TAKE LASIX  (FUROSEMIDE ) 40MG  ONCE WEEKLY ON MONDAYS   Follow-Up in: 9 MONTHS PLEASE CALL OUR OFFICE AROUND JUNE 2026 TO GET SCHEDULED FOR YOUR APPOINTMENT. PHONE NUMBER IS [phone] OPTION 2   At the [facility], you and your health needs are our priority. We have a designated team specialized in the treatment of Heart Failure. This Care Team includes your primary Heart Failure Specialized Cardiologist (physician), Advanced Practice Providers (APPs- Physician Assistants and Nurse Practitioners), and Pharmacist who all work together to provide you with the care you need, when you need it.   You may see any of the following providers on your designated Care Team at your next follow up:  Dr. Toribio Fuel Dr. Ezra Shuck Dr. Odis Brownie Greig Mosses, NP Caffie Shed, [name] [facility] [place], [name] Beckey Coe, NP Jordan Lee, NP Tinnie Redman, PharmD   Please be sure to bring in all your medications bottles to every appointment.   Need to Contact Us :  If you have any questions or concerns before your next appointment please send us  a message through [place] or call our office at [phone].    TO LEAVE A MESSAGE FOR THE NURSE SELECT OPTION 2, PLEASE LEAVE A MESSAGE INCLUDING: YOUR NAME DATE OF BIRTH CALL BACK NUMBER REASON FOR CALL**this is important as we prioritize the call backs  YOU WILL RECEIVE A CALL BACK THE SAME DAY AS LONG AS YOU CALL BEFORE 4:00 PM

## 2024-10-22 NOTE — Progress Notes (Signed)
 CARDIOLOGY CLINIC NOTE  Patient ID: Maurice Wood, male   DOB: 03/16/1933, 89 y.o.   MRN: [id number]  HPI:  Maurice Wood is an 89 y.o. 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  Had episode of CP in January 2011 and underwent cath which showed normal coronaries with EF45% (? artificially low due to AF). Echo  2/11: 60-65%  Echo 7/16  EF 60% Mod MR.  ECHO 2017 EF 60-65%.   Echo 11/18 EF 60-65% severe MAC. Moderate MR  He was seen in the ER in 6/21 for progressive SOB and cough. No CP. hstrop normal. (7,7). BNP 287 (in setting of chronic AF)  ECG AF 105. CXR showed mass-like density in the right infrahilar region   CT chest 06/09/20 concerning for moderate COPD and  ILD. Referred to Pulmonary   7/21 TEE to evaluate MR  EF 60-65% Normal RV  MV thickened with bileaflet prolapse 2-3+ MR  Echo  09/27/21 EF 60-65% Normal RV MV thick moderate MR. Mild to moderate AI. Personally reviewed  Echo 01/25/23: EF 60-65% Normal RV Mild AS/AI/MR  Admitted 4/25 with volume overload. Diuresed about 8 pounds  Echo 4/25 EF 60-65% mild MR Aoroot 4.7   Here for f/u with his wife.Several weeks ago had a fall and was seen in ER. All scans OK. Now using a walker and doing better. Being treated for UTI. No edema, orthopnea or PND.    Studies:  Echo 9/20 EF 60% mild-mod MR mild AI Severe LAE Personally reviewed  R/L cath 7/21   Ao = 129/73 (101) LV =  133/10 RA = 7 RV = 54/6 PA = 54/14 (30) PCW = 15 (V = 25) Fick cardiac output/index = 5.1/2.5 PVR = 3.0 Ao sat = 97% PA sat = 70%, 71%   Assessment: 1. Normal coronaries with left dominant system. RCA not injected but shown to be very small non-dominant vessel on previous study. LAD with small aneurysmal segment proximally but otherwise normal 2. EF 65% 3. Mild PAH with normal output. PCWP 15. V wave 22-25   PFTs 12/21: FEV1 1.87 (70%), FVC 2.90 (76%)  DLCO 60%   Review of systems complete and found to be negative unless listed in HPI.   Past Medical History:  Diagnosis Date   Allergy    rhinitis   Atrial fibrillation [facility]) Jan 2007   echo 1-07 normal ejection fraction, no significant valvular disease. adenosine  cardiolite  1-07  no evidence of ischemia. A 48 hour Holter monitor in 7-07 showed good rate controlw/ chronic atrial fibrillation. Cath 1/11 normal cors. EF 45%. Echo 2-11 60-65%   Benign prostatic hypertrophy    Cerebral aneurysm    followed by Dr Dolphus   CHF (congestive heart failure) (HCC)    COPD (chronic obstructive pulmonary disease) (HCC)    Fatigue    History of chest pain    a. s/p LHC 05/2014 with normal cors   Hx of colonic polyps    diverticulosis   Hyperlipidemia    IBS (irritable bowel syndrome)    Incontinence    Per pt 12/14/11   Kidney stones    Obesity    Obstructive sleep apnea    noncompliant with CPAP   Retinal vein occlusion (HCC)     Current Outpatient Medications  Medication Sig Dispense Refill   albuterol  (VENTOLIN  HFA) 108 (90 Base) MCG/ACT inhaler Inhale 2 puffs into the lungs every  6 (six) hours as needed for wheezing or shortness of breath. 8 g 6   cholecalciferol (VITAMIN D3) 25 MCG (1000 UNIT) tablet Take 1,000 Units by mouth daily.     ELIQUIS  5 MG TABS tablet TAKE 1 TABLET BY MOUTH TWICE A DAY 60 tablet 2   Fluticasone -Umeclidin-Vilant (TRELEGY ELLIPTA ) 100-62.5-25 MCG/ACT AEPB INHALE 1 PUFF INTO THE LUNGS DAILY IN THE AFTERNOON 60 each 3   furosemide  (LASIX ) 40 MG tablet Take 1 tablet (40 mg total) by mouth 2 (two) times a week. Every Mon and Fri 8 tablet 11   HYDROcodone -acetaminophen  (NORCO/VICODIN) 5-325 MG tablet Take 1 tablet by mouth every 4 (four) hours as needed. 10 tablet 0   nitroGLYCERIN  (NITROSTAT ) 0.4 MG SL tablet Place 1 tablet (0.4 mg total) under the tongue every 5 (five) minutes as needed for chest pain. 30 tablet 0   omeprazole  (PRILOSEC) 20 MG capsule Take 1  capsule (20 mg total) by mouth daily. 30 capsule 6   ondansetron  (ZOFRAN -ODT) 4 MG disintegrating tablet Take 1 tablet (4 mg total) by mouth every 8 (eight) hours as needed for nausea or vomiting. 10 tablet 0   potassium chloride  SA (KLOR-CON  M) 20 MEQ tablet Take 2 tablets (40 mEq total) by mouth 2 (two) times a week. Every Mon and Fri 16 tablet 11   tamsulosin  (FLOMAX ) 0.4 MG CAPS capsule Take 1 capsule (0.4 mg total) by mouth daily. 14 capsule 0   Multiple Vitamin (MULTIVITAMIN WITH MINERALS) TABS tablet Take 1 tablet by mouth daily. (Patient not taking: Reported on 10/22/2024)     vitamin B-12 (CYANOCOBALAMIN ) 1000 MCG tablet Take 1,000 mcg by mouth daily. (Patient not taking: Reported on 10/22/2024)     No current facility-administered medications for this encounter.     PHYSICAL EXAM: Vitals:   10/22/24 1012  BP: 110/70  Pulse: 95  SpO2: 91%    Wt Readings from Last 3 Encounters:  10/22/24 72.5 kg (159 lb 12.8 oz)  10/18/24 74.8 kg (165 lb)  10/02/24 77.1 kg (170 lb)   Physical exam: General:  Sitting up  No resp difficulty HEENT: normal Neck: supple. no JVD.  Cor: Irregular rate & rhythm. 2/6 MR/TR Lungs: clear Abdomen: soft, nontender, nondistended.Good bowel sounds. Extremities: no cyanosis, clubbing, rash, edema Neuro: alert & orientedx3, cranial nerves grossly intact. moves all 4 extremities w/o difficulty. Affect pleasant     Lab Results  Component Value Date   CHOL 166 02/24/2014   HDL 53.80 02/24/2014   LDLCALC 84 02/24/2014   LDLDIRECT 97.4 11/09/2011   TRIG 143.0 02/24/2014   CHOLHDL 3 02/24/2014    ASSESSMENT & PLAN:  1. Chronic diastolic HF - Echo 09/27/21 EF 60-65% l RV MV thick moderate MR. Mild to moderate AI.  - Echo  01/25/23: EF 60-65% Normal RV Mild AS/AI/MR - Echo 4/25 EF 60-65% mild MR Aoroot 4.7  - Currently on lasix  40mg  M/F. Fluid status looks ok today if not a little dry. Recent labs look a little dry - Will decrease lasix   to once a  week.  - If has orthostatic symptoms - can hold lasix  on those days  2. A-fib -  Permanent. - Rate controlled  - Continue Eliquis  for now. If continues to have falls may have to stop  3. Mitral regurgitatiom - Moderate MR on echo 10/2017. EF 60-65% Severe MAC ?prolapse, but leaflets not seen well.  - TEE 7/21 with 2-3+ MR - Echo 09/27/21 EF 60-65% Normal RV MV thick moderate MR.  Mild to moderate AI.  - Echo  01/25/23: EF 60-65% Normal RV Mild AS/AI/MR -  Echo 4/25 EF 60-65% mild MR Aoroot 4.7  -  Continue to follow  4. OSA - compliant with CPAP  5. Abnormal chest CT - Felt to have COPD +/- ILD. Has seen Dr. Shellia.   6. Aortic root dilation - Echo  (01/25/23)  AoRoot 4.4cm.(likely overestimated) .  - Echo 4/25 EF 60-65% mild MR Aoroot 4.7  - Overall stable. Not candidate for surgical repair  7. DNR/DNI - no change   Toribio Fuel, MD  10:53 AM

## 2024-10-26 ENCOUNTER — Ambulatory Visit: Admitting: Podiatry

## 2024-10-26 ENCOUNTER — Encounter: Payer: Self-pay | Admitting: Podiatry

## 2024-10-26 DIAGNOSIS — E1142 Type 2 diabetes mellitus with diabetic polyneuropathy: Secondary | ICD-10-CM

## 2024-10-26 DIAGNOSIS — M79675 Pain in left toe(s): Secondary | ICD-10-CM

## 2024-10-26 DIAGNOSIS — M79674 Pain in right toe(s): Secondary | ICD-10-CM | POA: Diagnosis not present

## 2024-10-26 DIAGNOSIS — B351 Tinea unguium: Secondary | ICD-10-CM

## 2024-10-26 NOTE — Progress Notes (Signed)
 This patient returns to my office for at risk foot care.  This patient requires this care by a professional since this patient will be at risk due to having diabetic neuropathy, and coagulation disorder.  Patient is taking eliquiss.  This patient is unable to cut nails himself since the patient cannot reach his nails.These nails are painful walking and wearing shoes.  This patient presents for at risk foot care today.  General Appearance  Alert, conversant and in no acute stress.  Vascular  Dorsalis pedis and posterior tibial  pulses are  not palpable  bilaterally.  Capillary return is within normal limits  bilaterally. Temperature is within normal limits  bilaterally.  Neurologic  Senn-Weinstein monofilament wire test absent   bilaterally. Muscle power within normal limits bilaterally.  Nails Thick disfigured discolored nails with subungual debris  hallux nails  bilaterally. No evidence of bacterial infection or drainage bilaterally.  Orthopedic  No limitations of motion  feet .  No crepitus or effusions noted.  No bony pathology or digital deformities noted.  Skin  normotropic skin with no porokeratosis noted bilaterally.  No signs of infections or ulcers noted.     Onychomycosis  Pain in right toes  Pain in left toes  Consent was obtained for treatment procedures.   Mechanical debridement of nails 1-5  bilaterally performed with a nail nipper.  Filed with dremel without incident.    Return office visit    3 months                  Told patient to return for periodic foot care and evaluation due to potential at risk complications.   Helane Gunther DPM

## 2024-10-28 ENCOUNTER — Other Ambulatory Visit (HOSPITAL_COMMUNITY): Payer: Self-pay | Admitting: Internal Medicine

## 2024-11-12 DIAGNOSIS — J849 Interstitial pulmonary disease, unspecified: Secondary | ICD-10-CM | POA: Diagnosis not present

## 2024-11-12 DIAGNOSIS — G4733 Obstructive sleep apnea (adult) (pediatric): Secondary | ICD-10-CM | POA: Diagnosis not present

## 2024-11-12 DIAGNOSIS — J432 Centrilobular emphysema: Secondary | ICD-10-CM | POA: Diagnosis not present

## 2024-12-14 ENCOUNTER — Other Ambulatory Visit: Payer: Self-pay | Admitting: Urology

## 2024-12-15 ENCOUNTER — Encounter (HOSPITAL_COMMUNITY): Payer: Self-pay | Admitting: Urology

## 2024-12-15 NOTE — Progress Notes (Addendum)
 LITHO PREOP PHONE CALL   ALLERGIES REVIEWED: YES  MEDICATION REVIEW DONE: YES MEDICATIONS THAT PT SHOULD HOLD (LIST): NSAIDS and Eliquis  (2 day hold)  CAN PT WALK UP STAIRS WITHOUT SHORTNESS OF BREATH: YES HOME O2: NO CPAP: YES  IF YES, INFORMED PT TO BRING CPAP WITH TUBING AND MASK:YES   INFORMED DRIVER NEEDED FOR PROCEDURE: YES   PT WAS GIVEN BLUE FOLDER AT UROLOGY APPT: YES PT INFORMED TO BRING BLUE FOLDER WITH ALL CONTENTS: YES  REVIEWED ARRIVAL TIME AND LOCATION: YES  OTHER PERTINENT INFORMATION: Has Interstim implant, sent Dr Nieves staff message to inform him of this/cb Addendum- per Shona at office Dr E aware of this and ok with it

## 2024-12-17 NOTE — Progress Notes (Signed)
 Call placed to urology office to check on Eliquis  hold and make sure Interstim implant was okay to proceed. Spoke with [employer] for lithos, she said she has sent the cardiac clearance and hadnt heard back so called yesterday to confirm they had it and they do have it just not filled out yet. Shona said she would call patient when she heard back from the cardiologist. Also confirmed Interstim okay she said she has checked with Dr Carolee (his primary urologist) and Dr Nieves and okay to proceed.

## 2024-12-18 ENCOUNTER — Telehealth (HOSPITAL_COMMUNITY): Payer: Self-pay

## 2024-12-18 NOTE — Telephone Encounter (Signed)
"   Slick, [place], FNP  You6 minutes ago (11:22 AM)    Low CV risk for ESWL procedure. OK to hold Eliquis  2 days prior to surgery, resume as soon as safe to do so afterwards   RCRI score = 1 pt (1.1% risk of major cardiac event)    Copy of clearance with provider recommendations faxed to requesting office via Epic fax function.   "

## 2024-12-18 NOTE — Telephone Encounter (Signed)
" °  [facility]   Pre-operative Risk Assessment   Request for Surgical Clearance{  Procedure:  LEFT ESWL { Date of Surgery:  Clearance 12/21/24                             {  Surgeon:  Dr. Nieves Surgeon's Group or Practice Name:  [facility] Phone number:  [phone] Fax number:  [phone] { Type of Clearance Requested:   - Medical  - Pharmacy:  Hold Apixaban  (Eliquis ) can this be held 2 days prior to surgery?  { Type of Anesthesia:  not listed  Additional requests/questions:  Please advise surgeon/provider what medications should be held. Please fax a copy of clearance to the surgeon's office.  Signed, Parilee Hally B Dearra Myhand   12/18/2024, 9:53 AM   [facility]  "

## 2024-12-18 NOTE — Progress Notes (Signed)
 Patient for Litho Monday 1/12- looked back at patient chart today to see if  holding of Eliquis  had been addressed and the cardiologist said 2 day hold. Called patient to ensure he had gotten that info and he said not yet. I informed him to hold Eliquis  for 2 days so over Sat/Sun do not take or am of procedure. Patient understood.

## 2024-12-21 ENCOUNTER — Encounter (HOSPITAL_COMMUNITY): Payer: Self-pay | Admitting: Urology

## 2024-12-21 ENCOUNTER — Encounter (HOSPITAL_COMMUNITY)
Admission: RE | Disposition: A | Discharge status: Home / Self Care | Payer: Self-pay | Source: Home / Self Care | Attending: Urology

## 2024-12-21 ENCOUNTER — Other Ambulatory Visit: Payer: Self-pay

## 2024-12-21 ENCOUNTER — Ambulatory Visit (HOSPITAL_COMMUNITY)

## 2024-12-21 ENCOUNTER — Ambulatory Visit (HOSPITAL_COMMUNITY)
Admission: RE | Admit: 2024-12-21 | Discharge: 2024-12-21 | Disposition: A | Discharge status: Home / Self Care | Source: Home / Self Care | Attending: Urology | Admitting: Urology

## 2024-12-21 DIAGNOSIS — N201 Calculus of ureter: Secondary | ICD-10-CM | POA: Insufficient documentation

## 2024-12-21 DIAGNOSIS — N200 Calculus of kidney: Secondary | ICD-10-CM

## 2024-12-21 HISTORY — PX: EXTRACORPOREAL SHOCK WAVE LITHOTRIPSY: SHX1557

## 2024-12-21 SURGERY — LITHOTRIPSY, ESWL
Anesthesia: LOCAL | Laterality: Left

## 2024-12-21 MED ORDER — SODIUM CHLORIDE 0.9 % IV SOLN: INTRAVENOUS | Status: DC

## 2024-12-21 MED ORDER — CIPROFLOXACIN HCL 500 MG PO TABS
500.0000 mg | ORAL_TABLET | ORAL | Status: AC
  Administered 2024-12-21: 500 mg via ORAL
  Filled 2024-12-21: qty 1

## 2024-12-21 MED ORDER — DIPHENHYDRAMINE HCL 25 MG PO CAPS
25.0000 mg | ORAL_CAPSULE | ORAL | Status: AC
  Administered 2024-12-21: 25 mg via ORAL
  Filled 2024-12-21: qty 1

## 2024-12-21 MED ORDER — DIAZEPAM 5 MG PO TABS
10.0000 mg | ORAL_TABLET | ORAL | Status: AC
  Administered 2024-12-21: 5 mg via ORAL
  Filled 2024-12-21: qty 2

## 2024-12-21 NOTE — Op Note (Signed)
 See [facility] op note for full details:  11 mm left UPJ stone in pelvic kidney   Procedure: Discontinued due to low sats after exertion (move from chair to bed) - no flouro or shock wave given.

## 2024-12-21 NOTE — Progress Notes (Addendum)
 S: He denies any CP or SOB.  O: Frail, difficult transition from wheelchair to table and sats dropped. Came back up to 92  on 3 L.  Vitals - 133/76, hr 71, RR 20, sa 91%  NAD Alert and oriented  Abd - soft, NT Ext - no calf pain or swelling    KUB - stable 11 stone left pelvic kidney UPJ area   A/P: Left renal pelvic stone -   Treatment discontinued due to low sats after exertion when he moved from wheelchair to bed (no flouro or eswl done). Stone was incidentally found after a fall. No hydro on prior CT and stable on KUB. Will let primary urologist know to continue surveillance and consider URS or repeat ESWL in future.   Will plan to wean off O2 and outpt follow-up. Discussed in person with wife and son.

## 2024-12-21 NOTE — Interval H&P Note (Signed)
 History and Physical Interval Note:  12/21/2024 7:56 AM  Maurice Wood  has presented today for surgery, with the diagnosis of LEFT KIDNEY STONE.  He had nausea and paleness when he went from the wheelchair and then table. Sats were 82% on room air and on 3 L O2 he came up to 90%.     [name]

## 2024-12-21 NOTE — Interval H&P Note (Signed)
 History and Physical Interval Note:  12/21/2024 7:53 AM  Maurice Wood  has presented today for surgery, with the diagnosis of LEFT KIDNEY STONE. Correction - urine cx was from ER. No new office cx or ua. The < 10k mixed cx showed no bacteria on the UA. KUB today stable with 11 mm left UPJ stone.    [name]

## 2024-12-21 NOTE — Interval H&P Note (Signed)
 History and Physical Interval Note:  12/21/2024 7:49 AM  Maurice Wood  has presented today for surgery, with the diagnosis of LEFT KIDNEY STONE.  The various methods of treatment have been discussed with the patient and family. After consideration of risks, benefits and other options for treatment, the patient has consented to  Procedures: LITHOTRIPSY, ESWL (Left) as a surgical intervention.  The patient's history has been reviewed, patient examined, no change in status, stable for surgery.  I have reviewed the patient's chart and labs.  He has no fever. He has occasional dysuria which is stable - on and off for years. No bladder pain. Has some left flank/abd pain this AM. Office urine cx grew < 10 k mixed. Off eliquis  x 4 days. No gross hematuria. He does use the interstim. It is turned off. Discussed if we can safely target the stone and avoid the lead we will proceed but he may need ureteroscopy. He elects to proceed with left ESWL. Questions were answered to the patient's satisfaction.     [name]

## 2024-12-21 NOTE — Progress Notes (Signed)
 Lithotripsy cancelled. Per Lithotripsy RN, pt unable to maintain oxygen  saturation at a safe level for the procedure. Updated pt's wife and son and they verbalized understanding. Vital signs stable on room air with no s/s of distress. IV removed and pt discharged home.

## 2024-12-22 ENCOUNTER — Encounter (HOSPITAL_COMMUNITY): Payer: Self-pay | Admitting: Urology

## 2024-12-24 ENCOUNTER — Inpatient Hospital Stay (HOSPITAL_COMMUNITY)
Admission: EM | Admit: 2024-12-24 | Discharge: 2024-12-31 | DRG: 350 | Disposition: A | Discharge status: Home / Self Care | Attending: Family Medicine | Admitting: Family Medicine

## 2024-12-24 ENCOUNTER — Encounter (HOSPITAL_COMMUNITY): Payer: Self-pay | Admitting: Urology

## 2024-12-24 ENCOUNTER — Other Ambulatory Visit: Payer: Self-pay

## 2024-12-24 ENCOUNTER — Inpatient Hospital Stay (HOSPITAL_COMMUNITY): Admitting: Anesthesiology

## 2024-12-24 ENCOUNTER — Encounter (HOSPITAL_COMMUNITY)
Admission: EM | Disposition: A | Discharge status: Home / Self Care | Payer: Self-pay | Source: Home / Self Care | Attending: Internal Medicine

## 2024-12-24 ENCOUNTER — Inpatient Hospital Stay (HOSPITAL_COMMUNITY)

## 2024-12-24 ENCOUNTER — Encounter (HOSPITAL_COMMUNITY): Payer: Self-pay | Admitting: *Deleted

## 2024-12-24 DIAGNOSIS — G4733 Obstructive sleep apnea (adult) (pediatric): Secondary | ICD-10-CM | POA: Diagnosis present

## 2024-12-24 DIAGNOSIS — N5089 Other specified disorders of the male genital organs: Secondary | ICD-10-CM | POA: Diagnosis present

## 2024-12-24 DIAGNOSIS — R34 Anuria and oliguria: Secondary | ICD-10-CM | POA: Diagnosis present

## 2024-12-24 DIAGNOSIS — I4821 Permanent atrial fibrillation: Secondary | ICD-10-CM | POA: Diagnosis present

## 2024-12-24 DIAGNOSIS — Z887 Allergy status to serum and vaccine status: Secondary | ICD-10-CM

## 2024-12-24 DIAGNOSIS — Z7901 Long term (current) use of anticoagulants: Secondary | ICD-10-CM

## 2024-12-24 DIAGNOSIS — I110 Hypertensive heart disease with heart failure: Secondary | ICD-10-CM

## 2024-12-24 DIAGNOSIS — J449 Chronic obstructive pulmonary disease, unspecified: Secondary | ICD-10-CM

## 2024-12-24 DIAGNOSIS — I5031 Acute diastolic (congestive) heart failure: Secondary | ICD-10-CM

## 2024-12-24 DIAGNOSIS — Z811 Family history of alcohol abuse and dependence: Secondary | ICD-10-CM | POA: Diagnosis not present

## 2024-12-24 DIAGNOSIS — N400 Enlarged prostate without lower urinary tract symptoms: Secondary | ICD-10-CM | POA: Diagnosis present

## 2024-12-24 DIAGNOSIS — Z87891 Personal history of nicotine dependence: Secondary | ICD-10-CM | POA: Diagnosis not present

## 2024-12-24 DIAGNOSIS — R0989 Other specified symptoms and signs involving the circulatory and respiratory systems: Secondary | ICD-10-CM | POA: Diagnosis present

## 2024-12-24 DIAGNOSIS — Z960 Presence of urogenital implants: Secondary | ICD-10-CM | POA: Diagnosis present

## 2024-12-24 DIAGNOSIS — Y95 Nosocomial condition: Secondary | ICD-10-CM | POA: Diagnosis present

## 2024-12-24 DIAGNOSIS — E876 Hypokalemia: Secondary | ICD-10-CM | POA: Diagnosis present

## 2024-12-24 DIAGNOSIS — E785 Hyperlipidemia, unspecified: Secondary | ICD-10-CM | POA: Diagnosis present

## 2024-12-24 DIAGNOSIS — N202 Calculus of kidney with calculus of ureter: Secondary | ICD-10-CM | POA: Diagnosis present

## 2024-12-24 DIAGNOSIS — Z87442 Personal history of urinary calculi: Secondary | ICD-10-CM

## 2024-12-24 DIAGNOSIS — R0902 Hypoxemia: Secondary | ICD-10-CM | POA: Diagnosis present

## 2024-12-24 DIAGNOSIS — Z781 Physical restraint status: Secondary | ICD-10-CM

## 2024-12-24 DIAGNOSIS — Z8249 Family history of ischemic heart disease and other diseases of the circulatory system: Secondary | ICD-10-CM | POA: Diagnosis not present

## 2024-12-24 DIAGNOSIS — R54 Age-related physical debility: Secondary | ICD-10-CM | POA: Diagnosis present

## 2024-12-24 DIAGNOSIS — K4030 Unilateral inguinal hernia, with obstruction, without gangrene, not specified as recurrent: Secondary | ICD-10-CM | POA: Diagnosis present

## 2024-12-24 DIAGNOSIS — N179 Acute kidney failure, unspecified: Secondary | ICD-10-CM | POA: Diagnosis present

## 2024-12-24 DIAGNOSIS — J440 Chronic obstructive pulmonary disease with acute lower respiratory infection: Secondary | ICD-10-CM | POA: Diagnosis present

## 2024-12-24 DIAGNOSIS — R410 Disorientation, unspecified: Secondary | ICD-10-CM | POA: Diagnosis present

## 2024-12-24 DIAGNOSIS — Z886 Allergy status to analgesic agent status: Secondary | ICD-10-CM

## 2024-12-24 DIAGNOSIS — Z91041 Radiographic dye allergy status: Secondary | ICD-10-CM

## 2024-12-24 DIAGNOSIS — Z79899 Other long term (current) drug therapy: Secondary | ICD-10-CM

## 2024-12-24 DIAGNOSIS — Z978 Presence of other specified devices: Secondary | ICD-10-CM

## 2024-12-24 DIAGNOSIS — Z833 Family history of diabetes mellitus: Secondary | ICD-10-CM

## 2024-12-24 DIAGNOSIS — K56609 Unspecified intestinal obstruction, unspecified as to partial versus complete obstruction: Secondary | ICD-10-CM | POA: Diagnosis not present

## 2024-12-24 DIAGNOSIS — Z860100 Personal history of colon polyps, unspecified: Secondary | ICD-10-CM | POA: Diagnosis not present

## 2024-12-24 HISTORY — PX: INGUINAL HERNIA REPAIR: SHX194

## 2024-12-24 LAB — URINALYSIS, ROUTINE W REFLEX MICROSCOPIC
Bilirubin Urine: NEGATIVE
Glucose, UA: NEGATIVE mg/dL
Ketones, ur: 5 mg/dL — AB
Nitrite: NEGATIVE
Protein, ur: 30 mg/dL — AB
Specific Gravity, Urine: 1.027 (ref 1.005–1.030)
pH: 5 (ref 5.0–8.0)

## 2024-12-24 LAB — COMPREHENSIVE METABOLIC PANEL WITH GFR
ALT: 15 U/L (ref 0–44)
AST: 40 U/L (ref 15–41)
Albumin: 3.8 g/dL (ref 3.5–5.0)
Alkaline Phosphatase: 81 U/L (ref 38–126)
Anion gap: 12 (ref 5–15)
BUN: 19 mg/dL (ref 8–23)
CO2: 22 mmol/L (ref 22–32)
Calcium: 10 mg/dL (ref 8.9–10.3)
Chloride: 102 mmol/L (ref 98–111)
Creatinine, Ser: 1.19 mg/dL (ref 0.61–1.24)
GFR, Estimated: 58 mL/min — ABNORMAL LOW
Glucose, Bld: 126 mg/dL — ABNORMAL HIGH (ref 70–99)
Potassium: 5.4 mmol/L — ABNORMAL HIGH (ref 3.5–5.1)
Sodium: 136 mmol/L (ref 135–145)
Total Bilirubin: 2 mg/dL — ABNORMAL HIGH (ref 0.0–1.2)
Total Protein: 7.4 g/dL (ref 6.5–8.1)

## 2024-12-24 LAB — CBC
HCT: 45.4 % (ref 39.0–52.0)
Hemoglobin: 14.6 g/dL (ref 13.0–17.0)
MCH: 29.2 pg (ref 26.0–34.0)
MCHC: 32.2 g/dL (ref 30.0–36.0)
MCV: 90.8 fL (ref 80.0–100.0)
Platelets: 209 K/uL (ref 150–400)
RBC: 5 MIL/uL (ref 4.22–5.81)
RDW: 14 % (ref 11.5–15.5)
WBC: 13.6 K/uL — ABNORMAL HIGH (ref 4.0–10.5)
nRBC: 0 % (ref 0.0–0.2)

## 2024-12-24 LAB — LIPASE, BLOOD: Lipase: <10 U/L — ABNORMAL LOW (ref 11–51)

## 2024-12-24 MED ORDER — HYDROMORPHONE HCL 1 MG/ML IJ SOLN
1.0000 mg | Freq: Once | INTRAMUSCULAR | Status: AC
  Administered 2024-12-24: 1 mg via INTRAVENOUS
  Filled 2024-12-24: qty 1

## 2024-12-24 MED ORDER — SUGAMMADEX SODIUM 200 MG/2ML IV SOLN
INTRAVENOUS | Status: DC | PRN
  Administered 2024-12-24: 150 mg via INTRAVENOUS

## 2024-12-24 MED ORDER — PROTHROMBIN COMPLEX CONC HUMAN 500 UNITS IV KIT: 1500.0000 [IU] | PACK | Status: DC

## 2024-12-24 MED ORDER — MORPHINE SULFATE (PF) 4 MG/ML IV SOLN
4.0000 mg | Freq: Once | INTRAVENOUS | Status: AC
  Administered 2024-12-24: 4 mg via INTRAVENOUS
  Filled 2024-12-24: qty 1

## 2024-12-24 MED ORDER — FENTANYL CITRATE (PF) 50 MCG/ML IJ SOSY: 25.0000 ug | PREFILLED_SYRINGE | INTRAMUSCULAR | Status: DC | PRN

## 2024-12-24 MED ORDER — VANCOMYCIN HCL IN DEXTROSE 1-5 GM/200ML-% IV SOLN: 1000.0000 mg | Freq: Once | INTRAVENOUS | Status: DC

## 2024-12-24 MED ORDER — BUPIVACAINE HCL (PF) 0.25 % IJ SOLN
INTRAMUSCULAR | Status: DC | PRN
  Administered 2024-12-24: 15 mL

## 2024-12-24 MED ORDER — METHOCARBAMOL 1000 MG/10ML IJ SOLN
500.0000 mg | Freq: Three times a day (TID) | INTRAMUSCULAR | Status: DC
  Administered 2024-12-25 – 2024-12-28 (×10): 500 mg via INTRAVENOUS
  Filled 2024-12-24 (×10): qty 10

## 2024-12-24 MED ORDER — PIPERACILLIN-TAZOBACTAM 3.375 G IVPB 30 MIN
3.3750 g | Freq: Once | INTRAVENOUS | Status: AC
  Administered 2024-12-24: 3.375 g via INTRAVENOUS
  Filled 2024-12-24: qty 50

## 2024-12-24 MED ORDER — ROCURONIUM BROMIDE 10 MG/ML (PF) SYRINGE
PREFILLED_SYRINGE | INTRAVENOUS | Status: DC | PRN
  Administered 2024-12-24: 40 mg via INTRAVENOUS

## 2024-12-24 MED ORDER — PROPOFOL 10 MG/ML IV BOLUS
INTRAVENOUS | Status: DC | PRN
  Administered 2024-12-24: 100 mg via INTRAVENOUS

## 2024-12-24 MED ORDER — ONDANSETRON HCL 4 MG/2ML IJ SOLN
4.0000 mg | Freq: Four times a day (QID) | INTRAMUSCULAR | Status: DC | PRN
  Administered 2024-12-26 – 2024-12-29 (×4): 4 mg via INTRAVENOUS
  Filled 2024-12-24 (×5): qty 2

## 2024-12-24 MED ORDER — SODIUM CHLORIDE 0.9 % IV SOLN: 1.0000 g | Freq: Once | INTRAVENOUS | Status: DC

## 2024-12-24 MED ORDER — PROTHROMBIN COMPLEX CONC HUMAN 500 UNITS IV KIT
3387.0000 [IU] | PACK | Status: AC
  Administered 2024-12-24: 3387 [IU] via INTRAVENOUS
  Filled 2024-12-24: qty 3387

## 2024-12-24 MED ORDER — BUPIVACAINE HCL (PF) 0.25 % IJ SOLN
INTRAMUSCULAR | Status: AC
  Filled 2024-12-24: qty 30

## 2024-12-24 MED ORDER — LIDOCAINE 2% (20 MG/ML) 5 ML SYRINGE
INTRAMUSCULAR | Status: DC | PRN
  Administered 2024-12-24: 40 mg via INTRAVENOUS

## 2024-12-24 MED ORDER — LACTATED RINGERS IV SOLN: INTRAVENOUS | Status: DC

## 2024-12-24 MED ORDER — FENTANYL CITRATE (PF) 250 MCG/5ML IJ SOLN
INTRAMUSCULAR | Status: DC | PRN
  Administered 2024-12-24 (×2): 50 ug via INTRAVENOUS

## 2024-12-24 MED ORDER — CEFAZOLIN SODIUM-DEXTROSE 2-4 GM/100ML-% IV SOLN
2.0000 g | INTRAVENOUS | Status: AC
  Administered 2024-12-24: 2 g via INTRAVENOUS
  Filled 2024-12-24 (×2): qty 100

## 2024-12-24 MED ORDER — ONDANSETRON HCL 4 MG PO TABS: 4.0000 mg | ORAL_TABLET | Freq: Four times a day (QID) | ORAL | Status: DC | PRN

## 2024-12-24 MED ORDER — SODIUM CHLORIDE 0.9 % IV SOLN: INTRAVENOUS | Status: DC

## 2024-12-24 MED ORDER — METRONIDAZOLE 500 MG/100ML IV SOLN
500.0000 mg | INTRAVENOUS | Status: AC
  Administered 2024-12-24: 500 mg via INTRAVENOUS
  Filled 2024-12-24: qty 100

## 2024-12-24 MED ORDER — CHLORHEXIDINE GLUCONATE CLOTH 2 % EX PADS: 6.0000 | MEDICATED_PAD | Freq: Once | CUTANEOUS | Status: DC

## 2024-12-24 MED ORDER — ENOXAPARIN SODIUM 40 MG/0.4ML IJ SOSY: 40.0000 mg | PREFILLED_SYRINGE | INTRAMUSCULAR | Status: DC

## 2024-12-24 MED ORDER — FENTANYL CITRATE (PF) 100 MCG/2ML IJ SOLN
INTRAMUSCULAR | Status: AC
  Filled 2024-12-24: qty 2

## 2024-12-24 MED ORDER — CHLORHEXIDINE GLUCONATE 0.12 % MT SOLN
15.0000 mL | Freq: Once | OROMUCOSAL | Status: AC
  Administered 2024-12-24: 15 mL via OROMUCOSAL

## 2024-12-24 MED ORDER — HYDROMORPHONE HCL 1 MG/ML IJ SOLN
0.5000 mg | INTRAMUSCULAR | Status: DC | PRN
  Administered 2024-12-28: 0.5 mg via INTRAVENOUS
  Filled 2024-12-24: qty 1

## 2024-12-24 MED ORDER — VITAMIN K1 10 MG/ML IJ SOLN
10.0000 mg | INTRAVENOUS | Status: DC
  Filled 2024-12-24: qty 1

## 2024-12-24 MED ORDER — VANCOMYCIN HCL 1250 MG/250ML IV SOLN
1250.0000 mg | Freq: Once | INTRAVENOUS | Status: AC
  Administered 2024-12-24: 1250 mg via INTRAVENOUS
  Filled 2024-12-24: qty 250

## 2024-12-24 MED ORDER — ONDANSETRON HCL 4 MG/2ML IJ SOLN
INTRAMUSCULAR | Status: DC | PRN
  Administered 2024-12-24: 4 mg via INTRAVENOUS

## 2024-12-24 MED ORDER — SUCCINYLCHOLINE CHLORIDE 200 MG/10ML IV SOSY
PREFILLED_SYRINGE | INTRAVENOUS | Status: DC | PRN
  Administered 2024-12-24: 100 mg via INTRAVENOUS

## 2024-12-24 MED ORDER — ACETAMINOPHEN 10 MG/ML IV SOLN
1000.0000 mg | Freq: Four times a day (QID) | INTRAVENOUS | Status: AC
  Administered 2024-12-25 (×4): 1000 mg via INTRAVENOUS
  Filled 2024-12-24 (×4): qty 100

## 2024-12-24 MED ORDER — PHENYLEPHRINE HCL-NACL 20-0.9 MG/250ML-% IV SOLN
INTRAVENOUS | Status: DC | PRN
  Administered 2024-12-24: 160 ug via INTRAVENOUS
  Administered 2024-12-24: 25 ug/min via INTRAVENOUS

## 2024-12-24 MED ORDER — ALBUTEROL SULFATE (2.5 MG/3ML) 0.083% IN NEBU: 2.5000 mg | INHALATION_SOLUTION | RESPIRATORY_TRACT | Status: DC | PRN

## 2024-12-24 NOTE — Anesthesia Preprocedure Evaluation (Addendum)
"                                    Anesthesia Evaluation  Patient identified by MRN, date of birth, ID band Patient awake    Reviewed: Allergy & Precautions, NPO status , Patient's Chart, lab work & pertinent test results  Airway Mallampati: II  TM Distance: >3 FB Neck ROM: Full    Dental  (+) Teeth Intact, Dental Advisory Given   Pulmonary sleep apnea , COPD, former smoker   breath sounds clear to auscultation       Cardiovascular +CHF  + dysrhythmias Atrial Fibrillation  Rhythm:Regular Rate:Normal  Echo:   1. Left ventricular ejection fraction, by estimation, is 60 to 65%. The  left ventricle has normal function. The left ventricle has no regional  wall motion abnormalities. Left ventricular diastolic function could not  be evaluated.   2. Right ventricular systolic function is normal. The right ventricular  size is normal. There is normal pulmonary artery systolic pressure. The  estimated right ventricular systolic pressure is 29.2 mmHg.   3. Left atrial size was severely dilated.   4. Right atrial size was moderately dilated.   5. The mitral valve is normal in structure. Mild mitral valve  regurgitation. No evidence of mitral stenosis. Moderate mitral annular  calcification.   6. The aortic valve is tricuspid. There is mild calcification of the  aortic valve. There is mild thickening of the aortic valve. Aortic valve  regurgitation is not visualized. Aortic valve sclerosis is present, with  no evidence of aortic valve stenosis.   7. Aortic dilatation noted. There is moderate dilatation of the aortic  root, measuring 47 mm.     Neuro/Psych  Neuromuscular disease  negative psych ROS   GI/Hepatic Neg liver ROS,GERD  ,,  Endo/Other  diabetes    Renal/GU Renal disease     Musculoskeletal negative musculoskeletal ROS (+)    Abdominal   Peds  Hematology negative hematology ROS (+)   Anesthesia Other Findings NGT in place   Reproductive/Obstetrics                              Anesthesia Physical Anesthesia Plan  ASA: 3 and emergent  Anesthesia Plan: General   Post-op Pain Management: Ofirmev  IV (intra-op)*   Induction: Intravenous, Rapid sequence and Cricoid pressure planned  PONV Risk Score and Plan: 3 and Ondansetron  and Treatment may vary due to age or medical condition  Airway Management Planned: Oral ETT  Additional Equipment: None  Intra-op Plan:   Post-operative Plan: Extubation in OR  Informed Consent: I have reviewed the patients History and Physical, chart, labs and discussed the procedure including the risks, benefits and alternatives for the proposed anesthesia with the patient or authorized representative who has indicated his/her understanding and acceptance.     Dental advisory given  Plan Discussed with: CRNA  Anesthesia Plan Comments:          Anesthesia Quick Evaluation  "

## 2024-12-24 NOTE — ED Provider Notes (Signed)
" °  Physical Exam  BP 117/74   Pulse 87   Temp (!) 97.5 F (36.4 C) (Oral)   Resp 19   Ht 5' 10 (1.778 m)   Wt 68.9 kg   SpO2 95%   BMI 21.81 kg/m   Physical Exam Vitals and nursing note reviewed.  Constitutional:      Appearance: He is well-developed.  HENT:     Head: Normocephalic and atraumatic.  Cardiovascular:     Rate and Rhythm: Normal rate.  Pulmonary:     Effort: Pulmonary effort is normal.  Abdominal:     General: Abdomen is flat.     Tenderness: There is abdominal tenderness.     Hernia: A hernia is present. Hernia is present in the right inguinal area.  Skin:    General: Skin is warm and dry.  Neurological:     Mental Status: He is alert and oriented to person, place, and time.     Procedures  Procedures  ED Course / MDM    Medical Decision Making Amount and/or Complexity of Data Reviewed Labs: ordered.  Risk Prescription drug management.   Patient care assumed from [place] T. PA at shift change, please see his note for a full HPI. Briefly, patient here with concerns of strangulated bowel.  Had outpatient CT which that shows some right inguinal hernia with a small bowel obstruction.  Plan was for general surgery to be consulted to help with reduction.  Patient is hypoxic and now having any new oxygen  requirement, will need admission.  4:16 PM spoke to [place] APP from general surgery who recommended admission via hospitalist, patient will need to remain n.p.o. as they likely will try to reduce this at the bedside versus operating room tonight.  NG tube will be placed per general surgery recommendations as well. Call placed for hospitalist admission.  4:28 PM Spoke to hospitalist service who will admit patient for further management. Patient stable for admission.   Portions of this note were generated with Scientist, clinical (histocompatibility and immunogenetics). Dictation errors may occur despite best attempts at proofreading.         Lewanna Petrak, PA-C 12/24/24 1628    Patt Alm Macho, MD 12/24/24 2236  "

## 2024-12-24 NOTE — ED Provider Notes (Signed)
 " [place] EMERGENCY DEPARTMENT AT [facility] Provider Note   CSN: [id number] Arrival date & time: 12/24/24  1202     Patient presents with: Abdominal Pain   Maurice Wood is a 89 y.o. male.   The history is provided by the patient, the spouse and medical records. No language interpreter was used.  Abdominal Pain    89 year old male with history of atrial fibrillation on Eliquis , COPD, IBS, CHF, kidney stone, chronic pain, sent here from urology office with concerns of a strangulated bowel.  Patient was recently evaluated by urology due to having flank pain was found to have a 11 mm kidney stone at the left UPJ.  He was scheduled 3 days ago to have lithotripsy procedure.  However, during the procedure, he became hypoxic while transition from wheelchair to the table and therefore he was placed on supplemental oxygen  and then have his appointment rescheduled.  Today he went to the urology office for evaluation and another time he was complaining of more pain to his groin area.  Urology team evaluated patient and noted that he had a hernia to his right inguinal region.  A CT scan was obtained with finding concerning for incarcerated hernia.  Patient sent here for further evaluation.  Currently patient states pain is mild.  Pain is and swelling is primarily to his right groin.  He mention he has had hernia in the past with hernia repair.  He has had some nausea and vomiting for the past few days but attributed to kidney stone pain.  He does not recall his last bowel movement.  He has hold off his Eliquis  for the past several days.  He denies having significant shortness of breath no fever or chills.  He denies any urinary discomfort.  Prior to Admission medications  Medication Sig Start Date End Date Taking? Authorizing Provider  albuterol  (VENTOLIN  HFA) 108 (90 Base) MCG/ACT inhaler Inhale 2 puffs into the lungs every 6 (six) hours as needed for wheezing or shortness of breath. 09/17/23    Cobb, Comer GAILS, NP  cholecalciferol (VITAMIN D3) 25 MCG (1000 UNIT) tablet Take 1,000 Units by mouth daily.    [provider]  ELIQUIS  5 MG TABS tablet TAKE 1 TABLET BY MOUTH TWICE A DAY 10/29/24   [name], Toribio SAUNDERS, MD  Fluticasone -Umeclidin-Vilant (TRELEGY ELLIPTA ) 100-62.5-25 MCG/ACT AEPB INHALE 1 PUFF INTO THE LUNGS DAILY IN THE AFTERNOON 08/28/23   Sood, Vineet, MD  furosemide  (LASIX ) 40 MG tablet Take 1 tablet (40 mg total) by mouth once a week. ONLY ON MONDAY'S 10/22/24   [name], Toribio SAUNDERS, MD  HYDROcodone -acetaminophen  (NORCO/VICODIN) 5-325 MG tablet Take 1 tablet by mouth every 4 (four) hours as needed. 10/18/24   Haviland, Julie, MD  Multiple Vitamin (MULTIVITAMIN WITH MINERALS) TABS tablet Take 1 tablet by mouth daily. Patient not taking: Reported on 10/22/2024    [provider]  nitroGLYCERIN  (NITROSTAT ) 0.4 MG SL tablet Place 1 tablet (0.4 mg total) under the tongue every 5 (five) minutes as needed for chest pain. 06/12/23 10/22/25  Glena Harlene HERO, FNP  omeprazole  (PRILOSEC) 20 MG capsule Take 1 capsule (20 mg total) by mouth daily. 06/12/23   Milford, Harlene HERO, FNP  ondansetron  (ZOFRAN -ODT) 4 MG disintegrating tablet Take 1 tablet (4 mg total) by mouth every 8 (eight) hours as needed for nausea or vomiting. 10/18/24   Dean Clarity, MD  potassium chloride  SA (KLOR-CON  M) 20 MEQ tablet Take 2 tablets (40 mEq total) by mouth 2 (  two) times a week. Every Mon and Fri 06/01/24   [name], Toribio SAUNDERS, MD  tamsulosin  (FLOMAX ) 0.4 MG CAPS capsule Take 1 capsule (0.4 mg total) by mouth daily. 10/18/24   Haviland, Julie, MD  vitamin B-12 (CYANOCOBALAMIN ) 1000 MCG tablet Take 1,000 mcg by mouth daily. Patient not taking: Reported on 10/22/2024    [provider]    Allergies: Nsaids, Iodinated contrast media, and Pneumococcal vaccines    Review of Systems  Gastrointestinal:  Positive for abdominal pain.  All other systems reviewed and are  negative.   Updated Vital Signs BP 129/77 (BP Location: Right Arm)   Pulse 86   Temp (!) 97.5 F (36.4 C) (Oral)   Resp 16   Ht 5' 10 (1.778 m)   Wt 68.9 kg   SpO2 90%   BMI 21.81 kg/m   Physical Exam Constitutional:      General: He is not in acute distress.    Appearance: He is well-developed.     Comments: Elderly male appears to be in no acute discomfort.  HENT:     Head: Atraumatic.  Eyes:     Conjunctiva/sclera: Conjunctivae normal.  Cardiovascular:     Rate and Rhythm: Normal rate and regular rhythm.     Pulses: Normal pulses.     Heart sounds: Murmur (loud holosystolic heart murmur) heard.  Pulmonary:     Effort: Pulmonary effort is normal.     Breath sounds: Normal breath sounds.  Abdominal:     Hernia: A hernia is present. Hernia is present in the right inguinal area.  Musculoskeletal:     Cervical back: Normal range of motion and neck supple.  Skin:    Findings: No rash.  Neurological:     Mental Status: He is alert.     (all labs ordered are listed, but only abnormal results are displayed) Labs Reviewed  LIPASE, BLOOD - Abnormal; Notable for the following components:      Result Value   Lipase <10 (*)    All other components within normal limits  COMPREHENSIVE METABOLIC PANEL WITH GFR - Abnormal; Notable for the following components:   Potassium 5.4 (*)    Glucose, Bld 126 (*)    Total Bilirubin 2.0 (*)    GFR, Estimated 58 (*)    All other components within normal limits  CBC - Abnormal; Notable for the following components:   WBC 13.6 (*)    All other components within normal limits  URINALYSIS, ROUTINE W REFLEX MICROSCOPIC - Abnormal; Notable for the following components:   Color, Urine AMBER (*)    APPearance HAZY (*)    Hgb urine dipstick SMALL (*)    Ketones, ur 5 (*)    Protein, ur 30 (*)    Leukocytes,Ua MODERATE (*)    Bacteria, UA RARE (*)    All other components within normal limits    EKG: None  Radiology: No results  found.  IMPRESSION: 1. New extension of small bowel into a right inguinal hernia with signs of resulting distal small bowel obstruction. Recommend general surgical consultation. 2. Persistent 1.0 cm calculus at the left ureteropelvic junction with mild intrarenal collecting system dilatation and perinephric soft tissue stranding implying persistent mild/intermittent obstruction. 3. New moderate amount of pelvic ascites. No organized extraluminal fluid collection or pneumoperitoneum. 4. New small left greater than right dependent pleural effusions with associated new patchy airspace disease in the left lower lobe, suspicious for pneumonia or atelectasis. 5. Mildly progressive loss of vertebral  body height at previously demonstrated subacute L2 compression deformity. 6. Aortic Atherosclerosis (ICD10-I70.0). 7. These results will be called to the ordering clinician or Stoiber, Azarion E - Report exported on Thu, Dec 24, 2024 14:06:05 -0500 - Page 2 of [facility] Radiology [address] [place], [address], [place], [place], [address] epresentative by the Printmaker, and communication documented in the PACS or [employer]. Electronically Signed By: Elsie Perone M.D. On: 12/24/2024 11:46    Procedures   Medications Ordered in the ED - No data to display                                  Medical Decision Making Amount and/or Complexity of Data Reviewed Labs: ordered.  Risk Prescription drug management.   BP 129/77 (BP Location: Right Arm)   Pulse 86   Temp (!) 97.5 F (36.4 C) (Oral)   Resp 16   Ht 5' 10 (1.778 m)   Wt 68.9 kg   SpO2 90%   BMI 21.81 kg/m   38:59 PM  89 year old male with history of atrial fibrillation on Eliquis , COPD, IBS, CHF, kidney stone, chronic pain, sent here from urology office with concerns of a strangulated bowel.  Patient was recently evaluated by urology due to having flank pain was found to have a 11 mm kidney stone at the left UPJ.  He was scheduled 3 days ago  to have lithotripsy procedure.  However, during the procedure, he became hypoxic while transition from wheelchair to the table and therefore he was placed on supplemental oxygen  and then have his appointment rescheduled.  Today he went to the urology office for evaluation and another time he was complaining of more pain to his groin area.  Urology team evaluated patient and noted that he had a hernia to his right inguinal region.  A CT scan was obtained with finding concerning for incarcerated hernia.  Patient sent here for further evaluation.  Currently patient states pain is mild.  Pain is and swelling is primarily to his right groin.  He mention he has had hernia in the past with hernia repair.  He has had some nausea and vomiting for the past few days but attributed to kidney stone pain.  He does not recall his last bowel movement.  He has hold off his Eliquis  for the past several days.  He denies having significant shortness of breath no fever or chills.  He denies any urinary discomfort.  Exam notable for right inguinal hernia tender to palpation.  I spoke with Jennaya, NP from [facility] urology who will fax the CT result of the abdomen pelvis with finding concerning for strangulation/right inguinal hernia.  Will consult trauma general surgery for further management.  Patient is noted to have an O2 sats of 88% on room air although he denies any significant shortness of breath, will provide supplemental oxygen  at 2 L  Abdomen/pelvis CT scan incidentally also found a new small left greater than right dependent pleural effusion with associated new patchy airspace disease in the left lower lobe suspicious for pneumonia.  Since patient is hypoxic, with the CT finding, antibiotics started.  Appreciate consultation from general surgery team who request pain management and they will come down to attempt to reduce the hernia.  Pt sign out to oncoming provider who will consult for admission     Final  diagnoses:  None    ED Discharge Orders  None          Nivia Colon, PA-C 12/24/24 1530    Armenta Canning, MD 12/28/24 1700  "

## 2024-12-24 NOTE — Anesthesia Procedure Notes (Signed)
 Procedure Name: Intubation Date/Time: 12/24/2024 6:29 PM  Performed by: Cena Epps, CRNAPre-anesthesia Checklist: Patient identified, Emergency Drugs available, Suction available and Patient being monitored Patient Re-evaluated:Patient Re-evaluated prior to induction Oxygen  Delivery Method: Circle System Utilized Preoxygenation: Pre-oxygenation with 100% oxygen  Induction Type: IV induction, Rapid sequence and Cricoid Pressure applied Laryngoscope Size: Mac and 4 Grade View: Grade I Tube type: Oral Tube size: 7.5 mm Number of attempts: 1 Airway Equipment and Method: Stylet and Oral airway Placement Confirmation: ETT inserted through vocal cords under direct vision, positive ETCO2 and breath sounds checked- equal and bilateral Secured at: 24 cm Tube secured with: Tape Dental Injury: Teeth and Oropharynx as per pre-operative assessment

## 2024-12-24 NOTE — H&P (Signed)
 " History and Physical    Maurice Wood  FMW:[id number]  DOB: Mar 02, 1933  DOA: 12/24/2024 PCP: Seabron Alm, MD   Patient coming from: home  Chief Complaint: pain in right inguinal area  HPI: Maurice Wood is a 89 y.o. male with medical history of A fib, COPD, nephrolithiasis, Chronic pain who is sent from the urology office for an inguinal hernia that was difficult to reduce. On 1/12 he shock wave lithotripsy was going to be performed on an 11 mm UPJ stone in left renal pelvis but he was noted to be hypoxic and the procedure was aborted.  He was then discharged home. He subsequently began to have vomiting and developed pain in right lower abdomen. He continued to have this pain and vomiting or dry heaving for days. Today he followed up with urology, underwent a CT scan, was found to have an incarcerated R inguinal hernia and was recommended to come to the ED.   ED Course:  WBC 13.6, K 5.4, T bili 2.0 Given Dilaudid  and Morphin Hernia could not be reduced by surgical PA  Review of Systems:  All other systems reviewed and apart from HPI, are negative.  Past Medical History:  Diagnosis Date   Allergy    rhinitis   Atrial fibrillation [facility]) Jan 2007   echo 1-07 normal ejection fraction, no significant valvular disease. adenosine  cardiolite  1-07  no evidence of ischemia. A 48 hour Holter monitor in 7-07 showed good rate controlw/ chronic atrial fibrillation. Cath 1/11 normal cors. EF 45%. Echo 2-11 60-65%   Benign prostatic hypertrophy    Cerebral aneurysm    followed by Dr Dolphus   CHF (congestive heart failure) (HCC)    COPD (chronic obstructive pulmonary disease) (HCC)    Fatigue    History of chest pain    a. s/p LHC 05/2014 with normal cors   Hx of colonic polyps    diverticulosis   Hyperlipidemia    IBS (irritable bowel syndrome)    Incontinence    Per pt 12/14/11   Kidney stones    Obesity    Obstructive sleep apnea    noncompliant with CPAP   Retinal vein occlusion  [facility])     Past Surgical History:  Procedure Laterality Date   CARDIAC CATHETERIZATION  1999   CHOLECYSTECTOMY     HERNIA REPAIR     inguinal and umbilical herniorrhaphy     INTERSTIM IMPLANT PLACEMENT  04/2017   LEFT HEART CATHETERIZATION WITH CORONARY ANGIOGRAM N/A 06/01/2014   Procedure: LEFT HEART CATHETERIZATION WITH CORONARY ANGIOGRAM;  Surgeon: Peter M Jordan, MD;  Location: [facility] CATH LAB;  Service: Cardiovascular;  Laterality: N/A;   LITHOTRIPSY     RIGHT/LEFT HEART CATH AND CORONARY ANGIOGRAPHY N/A 07/08/2020   Procedure: RIGHT/LEFT HEART CATH AND CORONARY ANGIOGRAPHY;  Surgeon: Cherrie Toribio SAUNDERS, MD;  Location: [facility] INVASIVE CV LAB;  Service: Cardiovascular;  Laterality: N/A;   TEE WITHOUT CARDIOVERSION N/A 07/05/2020   Procedure: TRANSESOPHAGEAL ECHOCARDIOGRAM (TEE);  Surgeon: Cherrie Toribio SAUNDERS, MD;  Location: [facility] ENDOSCOPY;  Service: Cardiovascular;  Laterality: N/A;   TEE WITHOUT CARDIOVERSION N/A 05/21/2023   Procedure: TRANSESOPHAGEAL ECHOCARDIOGRAM;  Surgeon: Cherrie Toribio SAUNDERS, MD;  Location: [facility] INVASIVE CV LAB;  Service: Cardiovascular;  Laterality: N/A;   TONSILLECTOMY AND ADENOIDECTOMY  as a child    Social History:   reports that he quit smoking about 53 years ago. His smoking use included cigarettes. He started smoking about 73 years ago. He has a 20 pack-year smoking  history. He has never used smokeless tobacco. He reports current alcohol use of about 1.0 standard drink of alcohol per week. He reports that he does not use drugs.  Allergies[1]  Family History  Problem Relation Age of Onset   Cancer Father        lung   Alcohol abuse Father    Aneurysm Father        Aortic aneurysm   Cancer Maternal Grandmother        colon   Sudden death Mother    Heart disease Paternal Grandfather    Diabetes Son    Diabetes Maternal Uncle    Diabetes Paternal Uncle      Prior to Admission medications  Medication Sig Start Date End Date Taking? Authorizing Provider   albuterol  (VENTOLIN  HFA) 108 (90 Base) MCG/ACT inhaler Inhale 2 puffs into the lungs every 6 (six) hours as needed for wheezing or shortness of breath. 09/17/23   Cobb, Comer GAILS, NP  cholecalciferol (VITAMIN D3) 25 MCG (1000 UNIT) tablet Take 1,000 Units by mouth daily.    [provider]  ELIQUIS  5 MG TABS tablet TAKE 1 TABLET BY MOUTH TWICE A DAY 10/29/24   [name], Toribio SAUNDERS, MD  Fluticasone -Umeclidin-Vilant (TRELEGY ELLIPTA ) 100-62.5-25 MCG/ACT AEPB INHALE 1 PUFF INTO THE LUNGS DAILY IN THE AFTERNOON 08/28/23   Sood, Vineet, MD  furosemide  (LASIX ) 40 MG tablet Take 1 tablet (40 mg total) by mouth once a week. ONLY ON MONDAY'S 10/22/24   [name], Toribio SAUNDERS, MD  HYDROcodone -acetaminophen  (NORCO/VICODIN) 5-325 MG tablet Take 1 tablet by mouth every 4 (four) hours as needed. 10/18/24   Haviland, Julie, MD  Multiple Vitamin (MULTIVITAMIN WITH MINERALS) TABS tablet Take 1 tablet by mouth daily. Patient not taking: Reported on 10/22/2024    [provider]  nitroGLYCERIN  (NITROSTAT ) 0.4 MG SL tablet Place 1 tablet (0.4 mg total) under the tongue every 5 (five) minutes as needed for chest pain. 06/12/23 10/22/25  Glena Harlene HERO, FNP  omeprazole  (PRILOSEC) 20 MG capsule Take 1 capsule (20 mg total) by mouth daily. 06/12/23   Milford, Harlene HERO, FNP  ondansetron  (ZOFRAN -ODT) 4 MG disintegrating tablet Take 1 tablet (4 mg total) by mouth every 8 (eight) hours as needed for nausea or vomiting. 10/18/24   Dean Clarity, MD  potassium chloride  SA (KLOR-CON  M) 20 MEQ tablet Take 2 tablets (40 mEq total) by mouth 2 (two) times a week. Every Mon and Fri 06/01/24   [name], Toribio SAUNDERS, MD  tamsulosin  (FLOMAX ) 0.4 MG CAPS capsule Take 1 capsule (0.4 mg total) by mouth daily. 10/18/24   Haviland, Julie, MD  vitamin B-12 (CYANOCOBALAMIN ) 1000 MCG tablet Take 1,000 mcg by mouth daily. Patient not taking: Reported on 10/22/2024    [provider]    Physical Exam: Wt Readings from Last 3  Encounters:  12/24/24 68.9 kg  12/21/24 68.9 kg  10/22/24 72.5 kg   Vitals:   12/24/24 1615 12/24/24 1630 12/24/24 1645 12/24/24 1700  BP: 103/89  114/73 121/73  Pulse: 90 81 84 85  Resp: (!) 21 19 (!) 25 (!) 21  Temp:      TempSrc:      SpO2: 95% 95% 94% 95%  Weight:      Height:          Constitutional:  Calm & comfortable Eyes: PERRLA, lids and conjunctivae normal ENT:  Mucous membranes are dry Pharynx clear of exudate   Normal dentition.  Respiratory:  Clear to auscultation bilaterally  Normal  respiratory effort.  Pulse ox 87% on room air Cardiovascular:  S1 & S2 heard, regular rate and rhythm No Murmurs Abdomen:  Swelling in right groin measuring about 4-5 inches in diameter, firm and tender. Not reducible. Pain increases when attempting to reduce the henia Extremities:  No clubbing / cyanosis No pedal edema  Skin:  No rashes, lesions or ulcers Neurologic:  AAO x 3 CN 2-12 grossly intact Sensation intact Strength 5/5 in all 4 extremities Psychiatric:  Normal Mood and affect    Labs on Admission: I have personally reviewed labs and imaging studies     Assessment/Plan Principal Problem:   Incarcerated inguinal hernia - will go to the OR tonight  Active Problems:    Hypoxia- h/o COPD and ILD- possible pneumonia - his pulse ox drops to 87% when I turn off his O2- he has never required O2 in the past - has a h/o mild pulm fibrosis and COPD - has LLL infiltrate - receiving Zosyn  and Vanc in ED  Left sided nephrolithiasis - will need to f/u with urology    Atrial fibrillation, permanent  - Last took Eliquis  last night- will receive KCentra  prior to surgery - not on rate controlling agent - rate is about 80-90 on the telemetry monitor    COPD (chronic obstructive pulmonary disease)   - currently no wheezing - will order PRN nebs  OSA - CPAP ordered  H/O CHF? Possibly diastolic - takes Lasix  once a week on Mondays    DVT prophylaxis:  Will need after OR  Code Status: Full code- discussed with patient in presence of his wife, son and DIL  Consults called: general surgery   Level of care: Progressive  True Atlas MD [facility]    12/24/2024, 5:24 PM        [1]  Allergies Allergen Reactions   Nsaids Other (See Comments)    NOT WHILE TAKING ELIQUIS     Iodinated Contrast Media Other (See Comments)    Pt states turning bright red   Pneumococcal Vaccines Hives and Rash   "

## 2024-12-24 NOTE — ED Provider Triage Note (Signed)
 Emergency Medicine Provider Triage Evaluation Note  Maurice Wood , a 89 y.o. male  was evaluated in triage.  Pt complains of abdominal pain.  Patient reports he is being seen by urology for concerns of a kidney stone and had imaging today including CT and ultrasound that reportedly showed concerns for bowel obstruction versus strangulation.  This has been nauseated and has poor appetite denies any vomiting.  Reports some pain to the right lower quadrant abdomen.  Review of Systems  Positive: As above Negative: As above  Physical Exam  BP 129/77 (BP Location: Right Arm)   Pulse 86   Temp (!) 97.5 F (36.4 C) (Oral)   Resp 16   Ht 5' 10 (1.778 m)   Wt 68.9 kg   SpO2 90%   BMI 21.81 kg/m  Gen:   Awake, no distress Resp:  Normal effort MSK:   Moves extremities without difficulty Other:  Bowel sounds present, soft, TTP RLQ  Medical Decision Making  Medically screening exam initiated at 12:40 PM.  Appropriate orders placed.  Maurice Wood was informed that the remainder of the evaluation will be completed by another provider, this initial triage assessment does not replace that evaluation, and the importance of remaining in the ED until their evaluation is complete.     Ciro Tashiro A, PA-C 12/24/24 1241

## 2024-12-24 NOTE — Anesthesia Postprocedure Evaluation (Signed)
"   Anesthesia Post Note  Patient: Muzammil Bruins Salvatierra  Procedure(s) Performed: REPAIR, HERNIA, INGUINAL, ADULT WITH MESH, DIAGNOSTIC LAPAROSCOPY (Right)     Patient location during evaluation: PACU Anesthesia Type: General Level of consciousness: awake and alert Pain management: pain level controlled Vital Signs Assessment: post-procedure vital signs reviewed and stable Respiratory status: spontaneous breathing, nonlabored ventilation, respiratory function stable and patient connected to nasal cannula oxygen  Cardiovascular status: blood pressure returned to baseline and stable Postop Assessment: no apparent nausea or vomiting Anesthetic complications: no   No notable events documented.  Last Vitals:  Vitals:   12/24/24 2045 12/24/24 2108  BP:  (!) 127/98  Pulse:  (!) 104  Resp: (!) (P) 1 18  Temp:    SpO2:  92%    Last Pain:  Vitals:   12/24/24 1759  TempSrc:   PainSc: 0-No pain                 Franky JONETTA Bald      "

## 2024-12-24 NOTE — Op Note (Signed)
 Preoperative diagnosis: Incarcerated wrangle hernia with small bowel obstruction Postoperative diagnosis: Same as above Procedure: 1.  Right inguinal hernia repair with Vicryl mesh 2.  Diagnostic laparoscopy Surgeon: Dr. Adina Bury Anesthesia: General Estimated blood loss: Less than 50 cc Complications: None Drains: None Specimens: None Sponge and count was correct at completion Disposition to recovery stable  Indications: This is an 89 year old male with history of atrial fibrillation on Eliquis  as well as multiple other medical issues whose had prior bilateral inguinal hernia repairs.  On Monday he started having pain in his right groin.  This persisted and he had nausea and vomiting associated with it.  He was seen today and underwent a CT scan that showed an incarcerated right inguinal hernia with some small bowel present and evidence of a bowel obstruction.  He also appears to have a possible pneumonia and this is associated with hypoxia.  I discussed proceeding into the operating room urgently.  Procedure: After informed consent was obtained he was taken to the operating.  He was given antibiotics.  SCDs were in place.  He was given Kcentra  due to his recent Eliquis  use.  He was then placed under general anesthesia without complication.  He had a nasogastric blue tube placed already.  He had a Foley in place as well.  He was then prepped and draped in a standard sterile surgical fashion.  A surgical timeout was then performed.  It appeared that the hernia had reduced somewhat.   I made a right groin incision and carried this through the fascia.  I was able to enter into the region where the hernia sac was.  This was so large I really just had to dissect around this and identify the structures.  I was able to identify the spermatic cord and retract that.  He had a very chronic inguinal hernia sac.  I went through this layer by layer.  Eventually I was able to enter into the hernia sac  which was very chronic and there was no bowel that was present in this at the time of the surgery.  I then excised a fair amount of the sac.  This was all reduced.  Due to the concern for his bowel I elected to perform a laparoscopy.  I used a Veress needle and access his abdomen at the left upper quadrant in the midclavicular line.  The Veress needle passed easily.  The saline drop test was positive.  I I then insufflated the abdomen to 15 mmHg pressure with a good opening pressure.  I then inserted a 5 mm direct optical entry trocar without difficulty.  I inserted an additional 5 mm trocar.  I then explored the bowel.  There was no bowel incarcerated as I thought.  There was some bowel that looked to have a portion of the hernia sac still adherent to it that was a little bit hemorrhagic but the actual bowel was very healthy.  I then removed the laparoscopic equipment and desufflated the abdomen.  I due to the fact that the hernia sac was hemorrhagic and a portions of it were necrotic I elected not to place permanent mesh.  He really needs a laparoscopic repair if he is going to have this repaired due to his prior surgery.  The hernia was a very small area medial on top of the bone with the spermatic cord lateral.  This was very tight due to the prior mesh that was in place.  This hole was about 8 mm  in diameter.  I elected to place a Vicryl mesh plug that I fashioned and sutured this into position attaching it to the bone with 3-0 Vicryl suture.  This was sutured into place and then I hooded the internal oblique down to his bone as well.  This was then buttressed by a Vicryl mesh patch overlying this that I sewed to his pubic tubercle and wrapped underneath the spermatic cord and sutured into numerous positions to the bone as well as the internal oblique.  I fully expect his hernia to recur but I did not want to do more at this point in time due to my concern for an infection.  I then obtained hemostasis.  I did  place some Surgicel powder due to some ooze.  I then closed the fascia with 2-0 Vicryl.  The remainder of the skin was closed with 3-0 Vicryl and staples were used to close the incision.  I used staples for the laparoscopic incision.  He tolerated this well was extubated and transferred to recovery.

## 2024-12-24 NOTE — Consult Note (Signed)
 Maurice Wood [birth year]  [id number].    Requesting MD: Dr. Alm Cave Chief Complaint/Reason for Consult: incarcerated right inguinal hernia with SBO  HPI:  This is an 89 yo male with a history of a fib on Eliquis  (LD 1/14 pm), nephrolithiasis, BPH, CHF, COPD, HLD, and cerebral aneurysm who was unaware that he had BIHs.  He has had inguinal hernias in the past that have been repaired as well as an umbilical hernia.  On Monday of this week, he started having some pain in his right groin.  On Tuesday he developed some n/v that has persisted.  He saw the NP at the urology office today for this pain as he thought it may be related to the current kidney stone he needs treatment for.  She felt he may have an incarcerated hernia and ordered a CT scan.  This revealed an incarcerated RIH with small bowel present along with evidence of a bowel obstruction and a possible LLL PNA with some hypoxia.  He was sent to the ED where we have been asked to see him.  ROS: ROS: see HPI  Family History  Problem Relation Age of Onset   Cancer Father        lung   Alcohol abuse Father    Aneurysm Father        Aortic aneurysm   Cancer Maternal Grandmother        colon   Sudden death Mother    Heart disease Paternal Grandfather    Diabetes Son    Diabetes Maternal Uncle    Diabetes Paternal Uncle     Past Medical History:  Diagnosis Date   Allergy    rhinitis   Atrial fibrillation (HCC) Jan 2007   echo 1-07 normal ejection fraction, no significant valvular disease. adenosine  cardiolite  1-07  no evidence of ischemia. A 48 hour Holter monitor in 7-07 showed good rate controlw/ chronic atrial fibrillation. Cath 1/11 normal cors. EF 45%. Echo 2-11 60-65%   Benign prostatic hypertrophy    Cerebral aneurysm    followed by Dr Dolphus   CHF (congestive heart failure) (HCC)    COPD (chronic obstructive pulmonary disease) (HCC)    Fatigue    History of chest pain    a. s/p LHC 05/2014 with normal cors    Hx of colonic polyps    diverticulosis   Hyperlipidemia    IBS (irritable bowel syndrome)    Incontinence    Per pt 12/14/11   Kidney stones    Obesity    Obstructive sleep apnea    noncompliant with CPAP   Retinal vein occlusion [facility])     Past Surgical History:  Procedure Laterality Date   CARDIAC CATHETERIZATION  1999   CHOLECYSTECTOMY     HERNIA REPAIR     inguinal and umbilical herniorrhaphy     INTERSTIM IMPLANT PLACEMENT  04/2017   LEFT HEART CATHETERIZATION WITH CORONARY ANGIOGRAM N/A 06/01/2014   Procedure: LEFT HEART CATHETERIZATION WITH CORONARY ANGIOGRAM;  Surgeon: Peter M Jordan, MD;  Location: [facility] CATH LAB;  Service: Cardiovascular;  Laterality: N/A;   LITHOTRIPSY     RIGHT/LEFT HEART CATH AND CORONARY ANGIOGRAPHY N/A 07/08/2020   Procedure: RIGHT/LEFT HEART CATH AND CORONARY ANGIOGRAPHY;  Surgeon: Cherrie Toribio SAUNDERS, MD;  Location: [facility] INVASIVE CV LAB;  Service: Cardiovascular;  Laterality: N/A;   TEE WITHOUT CARDIOVERSION N/A 07/05/2020   Procedure: TRANSESOPHAGEAL ECHOCARDIOGRAM (TEE);  Surgeon: Cherrie Toribio SAUNDERS, MD;  Location: [facility] ENDOSCOPY;  Service:  Cardiovascular;  Laterality: N/A;   TEE WITHOUT CARDIOVERSION N/A 05/21/2023   Procedure: TRANSESOPHAGEAL ECHOCARDIOGRAM;  Surgeon: Cherrie Toribio SAUNDERS, MD;  Location: [facility] INVASIVE CV LAB;  Service: Cardiovascular;  Laterality: N/A;   TONSILLECTOMY AND ADENOIDECTOMY  as a child    Social History:  reports that he quit smoking about 53 years ago. His smoking use included cigarettes. He started smoking about 73 years ago. He has a 20 pack-year smoking history. He has never used smokeless tobacco. He reports current alcohol use of about 1.0 standard drink of alcohol per week. He reports that he does not use drugs.  Allergies: Allergies[1]  (Not in a hospital admission)    Physical Exam: Blood pressure 117/74, pulse 87, temperature (!) 97.5 F (36.4 C), temperature source Oral, resp. rate 19, height 5' 10 (1.778 m),  weight 68.9 kg, SpO2 95%. General: pleasant, WD, WN, elderly white male who is laying in bed in NAD HEENT: head is normocephalic, atraumatic.  Sclera are noninjected.  PERRL.  Ears and nose without any masses or lesions.  Mouth is pink and dry Heart: irregular.  Normal s1,s2. No obvious gallops, or rubs noted, but + murmur.   Lungs: CTAB, no wheezes, rhonchi, or rales noted.  Respiratory effort nonlabored Abd: soft, mild diffuse tenderness, some bloating, +BS, no masses.  BIHs present.  Right hernia is hard and very tender.  Numerous attempts made to reduce this hernia after pain medication, trendelenburg, etc.  Unable to get it reduced at this time. Psych: A&Ox3 with an appropriate affect.   Results for orders placed or performed during the hospital encounter of 12/24/24 (from the past 48 hours)  Lipase, blood     Status: Abnormal   Collection Time: 12/24/24 12:51 PM  Result Value Ref Range   Lipase <10 (L) 11 - 51 U/L    Comment: HEMOLYZED SPECIMEN - SUGGEST RECOLLECT Slight hemolysis  Performed at [facility], [address], [place], [place] [address]   Comprehensive metabolic panel     Status: Abnormal   Collection Time: 12/24/24 12:51 PM  Result Value Ref Range   Sodium 136 135 - 145 mmol/L   Potassium 5.4 (H) 3.5 - 5.1 mmol/L    Comment: HEMOLYSIS AT THIS LEVEL MAY AFFECT RESULT   Chloride 102 98 - 111 mmol/L   CO2 22 22 - 32 mmol/L   Glucose, Bld 126 (H) 70 - 99 mg/dL    Comment: Glucose reference range applies only to samples taken after fasting for at least 8 hours.   BUN 19 8 - 23 mg/dL   Creatinine, Ser 8.80 0.61 - 1.24 mg/dL   Calcium  10.0 8.9 - 10.3 mg/dL   Total Protein 7.4 6.5 - 8.1 g/dL   Albumin 3.8 3.5 - 5.0 g/dL   AST 40 15 - 41 U/L    Comment: HEMOLYZED SPECIMEN - SUGGEST RECOLLECT Slight hemolysis     ALT 15 0 - 44 U/L    Comment: HEMOLYZED SPECIMEN - SUGGEST RECOLLECT Slight hemolysis     Alkaline Phosphatase 81 38 - 126 U/L   Total  Bilirubin 2.0 (H) 0.0 - 1.2 mg/dL   GFR, Estimated 58 (L) >60 mL/min    Comment: (NOTE) Calculated using the CKD-EPI Creatinine Equation (2021)    Anion gap 12 5 - 15    Comment: Performed at [facility], [address] [place], [place] [address]  CBC     Status: Abnormal   Collection Time: 12/24/24 12:51 PM  Result Value Ref Range   WBC 13.6 (H) 4.0 - 10.5 K/uL   RBC 5.00 4.22 - 5.81 MIL/uL   Hemoglobin 14.6 13.0 - 17.0 g/dL   HCT 54.5 60.9 - 47.9 %   MCV 90.8 80.0 - 100.0 fL   MCH 29.2 26.0 - 34.0 pg   MCHC 32.2 30.0 - 36.0 g/dL   RDW 85.9 88.4 - 84.4 %   Platelets 209 150 - 400 K/uL   nRBC 0.0 0.0 - 0.2 %    Comment: Performed at [facility], [address], [place], [place] [address]  Urinalysis, Routine w reflex microscopic -Urine, Clean Catch     Status: Abnormal   Collection Time: 12/24/24  1:27 PM  Result Value Ref Range   Color, Urine AMBER (A) YELLOW    Comment: BIOCHEMICALS MAY BE AFFECTED BY COLOR   APPearance HAZY (A) CLEAR   Specific Gravity, Urine 1.027 1.005 - 1.030   pH 5.0 5.0 - 8.0   Glucose, UA NEGATIVE NEGATIVE mg/dL   Hgb urine dipstick SMALL (A) NEGATIVE   Bilirubin Urine NEGATIVE NEGATIVE   Ketones, ur 5 (A) NEGATIVE mg/dL   Protein, ur 30 (A) NEGATIVE mg/dL   Nitrite NEGATIVE NEGATIVE   Leukocytes,Ua MODERATE (A) NEGATIVE   RBC / HPF 0-5 0 - 5 RBC/hpf   WBC, UA 11-20 0 - 5 WBC/hpf   Bacteria, UA RARE (A) NONE SEEN   Squamous Epithelial / HPF 0-5 0 - 5 /HPF   Mucus PRESENT    Hyaline Casts, UA PRESENT     Comment: Performed at [facility], [address] [place], [place] [address]   No results found.    Assessment/Plan SBO secondary to incarcerated RIH  The patient has been seen, examined, labs, vitals, chart, and imaging personally reviewed.  The patient has an incarcerated RIH that I am currently unable to get reduced after multiple attempts.  He does have a possible LLL PNA and  has had recent N/V for a couple of days.  We will place an NGT to avoid further possible aspiration and to decompress his bowels.  He last took his Eliquis  last night.  Dr. Sheldon is going to attempt reduction as ideally we would like to avoid surgery in the setting of Eliquis  and PNA if we can get this reduced.  If not, he will likely require emergent surgically intervention tonight.  I have discussed this with the patient prior to pain medication administration as well as his wife, son, and DIL who are at the bedside.  All are in agreement.     FEN - NPO/NGT/IVFs VTE - Eliquis , hold ID - [email or web address]  Admit - per TRH  PNA with hypoxia COPD A fib BPH CHF nephrolithiasis  I reviewed nursing notes, ED provider notes, last 24 h vitals and pain scores, last 48 h intake and output, last 24 h labs and trends, and last 24 h imaging results.  Burnard FORBES Banter, PA-C [facility] Surgery 12/24/2024, 4:26 PM Please see Amion for pager number during day hours 7:00am-4:30pm or 7:00am -11:30am on weekends      [1]  Allergies Allergen Reactions   Nsaids Other (See Comments)    NOT WHILE TAKING ELIQUIS     Iodinated Contrast Media Other (See Comments)    Pt states turning bright red   Pneumococcal Vaccines Hives and Rash   "

## 2024-12-24 NOTE — Progress Notes (Addendum)
" ° ° ° °  Patient Name: Maurice Wood           DOB: August 02, 1933  MRN: [id number]      Admission Date: 12/24/2024  Attending Provider: Earley Saucer, MD  Primary Diagnosis: Incarcerated inguinal hernia   Level of care: Progressive   OVERNIGHT EVENT   Patient back from PACU. Nursing staff reports he has been disoriented and agitated since after anesthesia.   Patient attempting to pull out NG tube and Foley.   Mitts and temporary soft wrist restraint placed.  Nursing staff to continue frequently reorienting.    Claude Waldman, DNP, ACNPC- AG [facility]    "

## 2024-12-24 NOTE — Transfer of Care (Signed)
 Immediate Anesthesia Transfer of Care Note  Patient: Maurice Wood  Procedure(s) Performed: REPAIR, HERNIA, INGUINAL, ADULT WITH MESH, DIAGNOSTIC LAPAROSCOPY (Right)  Patient Location: PACU  Anesthesia Type:General  Level of Consciousness: drowsy  Airway & Oxygen  Therapy: Patient Spontanous Breathing and Patient connected to face mask oxygen   Post-op Assessment: Report given to RN and Post -op Vital signs reviewed and stable  Post vital signs: Reviewed and stable  Last Vitals:  Vitals Value Taken Time  BP    Temp    Pulse    Resp    SpO2      Last Pain:  Vitals:   12/24/24 1759  TempSrc:   PainSc: 0-No pain         Complications: No notable events documented.

## 2024-12-24 NOTE — ED Triage Notes (Addendum)
 Pt reports that he has strangulation of the bowel and was sent her by his provider, pt denies abd pain at time of triage but reports pain prior, pt reports he already had a CT scan and was told it was urgent he get over here, pt also has kidney stones. Pt endorses nausea, denies v/d. PA made aware.

## 2024-12-25 ENCOUNTER — Encounter (HOSPITAL_COMMUNITY): Payer: Self-pay | Admitting: General Surgery

## 2024-12-25 DIAGNOSIS — K4030 Unilateral inguinal hernia, with obstruction, without gangrene, not specified as recurrent: Secondary | ICD-10-CM | POA: Diagnosis not present

## 2024-12-25 LAB — COMPREHENSIVE METABOLIC PANEL WITH GFR
ALT: 8 U/L (ref 0–44)
AST: 20 U/L (ref 15–41)
Albumin: 3.1 g/dL — ABNORMAL LOW (ref 3.5–5.0)
Alkaline Phosphatase: 70 U/L (ref 38–126)
Anion gap: 12 (ref 5–15)
BUN: 18 mg/dL (ref 8–23)
CO2: 21 mmol/L — ABNORMAL LOW (ref 22–32)
Calcium: 8.9 mg/dL (ref 8.9–10.3)
Chloride: 105 mmol/L (ref 98–111)
Creatinine, Ser: 1.32 mg/dL — ABNORMAL HIGH (ref 0.61–1.24)
GFR, Estimated: 52 mL/min — ABNORMAL LOW
Glucose, Bld: 109 mg/dL — ABNORMAL HIGH (ref 70–99)
Potassium: 4.6 mmol/L (ref 3.5–5.1)
Sodium: 138 mmol/L (ref 135–145)
Total Bilirubin: 1.4 mg/dL — ABNORMAL HIGH (ref 0.0–1.2)
Total Protein: 5.5 g/dL — ABNORMAL LOW (ref 6.5–8.1)

## 2024-12-25 LAB — CBC
HCT: 40 % (ref 39.0–52.0)
Hemoglobin: 12.4 g/dL — ABNORMAL LOW (ref 13.0–17.0)
MCH: 28.9 pg (ref 26.0–34.0)
MCHC: 31 g/dL (ref 30.0–36.0)
MCV: 93.2 fL (ref 80.0–100.0)
Platelets: 168 K/uL (ref 150–400)
RBC: 4.29 MIL/uL (ref 4.22–5.81)
RDW: 14.2 % (ref 11.5–15.5)
WBC: 10.6 K/uL — ABNORMAL HIGH (ref 4.0–10.5)
nRBC: 0 % (ref 0.0–0.2)

## 2024-12-25 LAB — MRSA NEXT GEN BY PCR, NASAL: MRSA by PCR Next Gen: NOT DETECTED

## 2024-12-25 MED ORDER — SODIUM CHLORIDE 0.9 % IV SOLN
2.0000 g | Freq: Two times a day (BID) | INTRAVENOUS | Status: DC
  Administered 2024-12-25 – 2024-12-28 (×7): 2 g via INTRAVENOUS
  Filled 2024-12-25 (×7): qty 12.5

## 2024-12-25 MED ORDER — VANCOMYCIN HCL 750 MG/150ML IV SOLN
750.0000 mg | INTRAVENOUS | Status: DC
  Filled 2024-12-25: qty 150

## 2024-12-25 MED ORDER — SODIUM CHLORIDE 0.9 % IV SOLN: INTRAVENOUS | Status: DC

## 2024-12-25 MED ORDER — NITROGLYCERIN 0.4 MG SL SUBL
0.4000 mg | SUBLINGUAL_TABLET | SUBLINGUAL | Status: DC | PRN
  Administered 2024-12-25: 0.4 mg via SUBLINGUAL
  Filled 2024-12-25: qty 1

## 2024-12-25 MED ORDER — CHLORHEXIDINE GLUCONATE CLOTH 2 % EX PADS
6.0000 | MEDICATED_PAD | Freq: Every day | CUTANEOUS | Status: DC
  Administered 2024-12-25: 6 via TOPICAL

## 2024-12-25 MED ORDER — ORAL CARE MOUTH RINSE: 15.0000 mL | OROMUCOSAL | Status: DC | PRN

## 2024-12-25 MED ORDER — BUDESONIDE 0.5 MG/2ML IN SUSP
0.5000 mg | Freq: Two times a day (BID) | RESPIRATORY_TRACT | Status: DC
  Administered 2024-12-25 – 2024-12-27 (×5): 0.5 mg via RESPIRATORY_TRACT
  Filled 2024-12-25 (×5): qty 2

## 2024-12-25 MED ORDER — VANCOMYCIN HCL IN DEXTROSE 1-5 GM/200ML-% IV SOLN: 1000.0000 mg | INTRAVENOUS | Status: DC

## 2024-12-25 MED ORDER — IPRATROPIUM-ALBUTEROL 0.5-2.5 (3) MG/3ML IN SOLN
3.0000 mL | Freq: Three times a day (TID) | RESPIRATORY_TRACT | Status: DC
  Administered 2024-12-25 (×2): 3 mL via RESPIRATORY_TRACT
  Filled 2024-12-25: qty 3

## 2024-12-25 NOTE — Progress Notes (Signed)
" °   12/25/24 0031  BiPAP/CPAP/SIPAP  BiPAP/CPAP/SIPAP Pt Type Adult  Reason BIPAP/CPAP not in use NG tube in place  BiPAP/CPAP /SiPAP Vitals  Resp 14  MEWS Score/Color  MEWS Score 2  MEWS Score Color Yellow    "

## 2024-12-25 NOTE — Progress Notes (Signed)
" °   12/25/24 2030  BiPAP/CPAP/SIPAP  Reason BIPAP/CPAP not in use Other(comment) (NOT ABLE TO WEAR DUE TO NG TUBE)    "

## 2024-12-25 NOTE — Plan of Care (Signed)
" °  Problem: Education: Goal: Knowledge of General Education information will improve Description: Including pain rating scale, medication(s)/side effects and non-pharmacologic comfort measures Outcome: Progressing   Problem: Health Behavior/Discharge Planning: Goal: Ability to manage health-related needs will improve Outcome: Progressing   Problem: Clinical Measurements: Goal: Ability to maintain clinical measurements within normal limits will improve Outcome: Progressing Goal: Will remain free from infection Outcome: Progressing Goal: Diagnostic test results will improve Outcome: Progressing Goal: Respiratory complications will improve Outcome: Progressing Goal: Cardiovascular complication will be avoided Outcome: Progressing   Problem: Activity: Goal: Risk for activity intolerance will decrease Outcome: Progressing   Problem: Nutrition: Goal: Adequate nutrition will be maintained Outcome: Progressing   Problem: Coping: Goal: Level of anxiety will decrease Outcome: Progressing   Problem: Elimination: Goal: Will not experience complications related to bowel motility Outcome: Progressing Goal: Will not experience complications related to urinary retention Outcome: Progressing   Problem: Pain Managment: Goal: General experience of comfort will improve and/or be controlled Outcome: Progressing   Problem: Safety: Goal: Ability to remain free from injury will improve Outcome: Progressing   Problem: Skin Integrity: Goal: Risk for impaired skin integrity will decrease Outcome: Progressing   Problem: Education: Goal: Knowledge of the prescribed therapeutic regimen will improve Outcome: Progressing   Problem: Bowel/Gastric: Goal: Gastrointestinal status for postoperative course will improve Outcome: Progressing   Problem: Cardiac: Goal: Ability to maintain an adequate cardiac output Outcome: Progressing Goal: Will show no evidence of cardiac arrhythmias Outcome:  Progressing   Problem: Nutritional: Goal: Will attain and maintain optimal nutritional status Outcome: Progressing   Problem: Neurological: Goal: Will regain or maintain usual level of consciousness Outcome: Progressing   Problem: Clinical Measurements: Goal: Ability to maintain clinical measurements within normal limits Outcome: Progressing Goal: Postoperative complications will be avoided or minimized Outcome: Progressing   Problem: Respiratory: Goal: Will regain and/or maintain adequate ventilation Outcome: Progressing Goal: Respiratory status will improve Outcome: Progressing   Problem: Skin Integrity: Goal: Demonstrates signs of wound healing without infection Outcome: Progressing   Problem: Urinary Elimination: Goal: Will remain free from infection Outcome: Progressing Goal: Ability to achieve and maintain adequate urine output Outcome: Progressing   Problem: Safety: Goal: Non-violent Restraint(s) Outcome: Progressing   "

## 2024-12-25 NOTE — Progress Notes (Signed)
 Pt c/o chest pain after we got him to the chair associated w/SOB. Sts pain is dull and burning. Pain is 8/10 and increases w/activity. EKG done   Scheduled IV acetaminophen  given . EKG show afib rate controlled in the 90s. BP 129/74 (91), HR 91, O2 99 on 3L [place], Temp 99.7.   Notified Dr. Earley. See MAR orders

## 2024-12-25 NOTE — Progress Notes (Signed)
 Pharmacy Antibiotic Note  Maurice Wood is a 89 y.o. male admitted on 12/24/2024 with inguinal hernia and possible pneumonia.  In the ED patient received Vancomycin  1250mg  IV and Zosyn  3.375g IV x 1 dose each for possible pneumonia.  MD notes LLL infiltrate.  Patient went to OR for inguinal hernia repair and received Cefazolin /Metronidazole  x 1 dose each pre-op.    Pharmacy has been consulted to continue dosing Vancomycin  and for Cefepime  dosing.  Plan: Vancomycin  1000 mg IV Q 24 hrs. Goal AUC 400-550.  Expected AUC: 524.4  SCr used: 1.19 Cefepime  2g IV q12h Follow renal function  Height: 5' 10 (177.8 cm) Weight: 68.9 kg (151 lb 14.4 oz) IBW/kg (Calculated) : 73  Temp (24hrs), Avg:97.9 F (36.6 C), Min:97.4 F (36.3 C), Max:98.4 F (36.9 C)  Recent Labs  Lab 12/24/24 1251  WBC 13.6*  CREATININE 1.19    Estimated Creatinine Clearance: 41 mL/min (by C-G formula based on SCr of 1.19 mg/dL).    Allergies[1]  Antimicrobials this admission: 1/16 Zosyn  x 1 1/16 Cefazolin  x 1 1/16 Metronidazole  x 1 1/16 Vancomycin  >> 1/17 Cefepime  >>    Thank you for allowing pharmacy to be a part of this patients care.  Arvin Gauss, PharmD 12/25/2024 12:10 AM     [1]  Allergies Allergen Reactions   Epinephrine Palpitations and Other (See Comments)    Increased Heart Rate, also   Nsaids Other (See Comments)    NOT WHILE TAKING ELIQUIS     Clarithromycin Other (See Comments)    Headaches    Iodinated Contrast Media Other (See Comments)    Pt states turned bright red   Nimodipine     Flushing   Pneumococcal Vaccines Hives and Rash

## 2024-12-25 NOTE — Plan of Care (Signed)
" °  Problem: Education: Goal: Knowledge of General Education information will improve Description: Including pain rating scale, medication(s)/side effects and non-pharmacologic comfort measures 12/25/2024 1720 by Julienne Pearson BRAVO, RN Outcome: Progressing 12/25/2024 1720 by Julienne Pearson BRAVO, RN Outcome: Progressing   Problem: Health Behavior/Discharge Planning: Goal: Ability to manage health-related needs will improve 12/25/2024 1720 by Julienne Pearson BRAVO, RN Outcome: Progressing 12/25/2024 1720 by Julienne Pearson BRAVO, RN Outcome: Progressing   Problem: Clinical Measurements: Goal: Ability to maintain clinical measurements within normal limits will improve Outcome: Progressing Goal: Will remain free from infection Outcome: Progressing Goal: Diagnostic test results will improve Outcome: Progressing Goal: Respiratory complications will improve Outcome: Progressing Goal: Cardiovascular complication will be avoided Outcome: Progressing   Problem: Activity: Goal: Risk for activity intolerance will decrease Outcome: Progressing   Problem: Nutrition: Goal: Adequate nutrition will be maintained Outcome: Progressing   Problem: Coping: Goal: Level of anxiety will decrease Outcome: Progressing   Problem: Elimination: Goal: Will not experience complications related to bowel motility Outcome: Progressing Goal: Will not experience complications related to urinary retention Outcome: Progressing   Problem: Pain Managment: Goal: General experience of comfort will improve and/or be controlled Outcome: Progressing   Problem: Safety: Goal: Ability to remain free from injury will improve Outcome: Progressing   Problem: Skin Integrity: Goal: Risk for impaired skin integrity will decrease Outcome: Progressing   Problem: Bowel/Gastric: Goal: Gastrointestinal status for postoperative course will improve Outcome: Progressing   Problem: Cardiac: Goal: Ability to maintain an adequate cardiac  output Outcome: Progressing Goal: Will show no evidence of cardiac arrhythmias Outcome: Progressing   Problem: Nutritional: Goal: Will attain and maintain optimal nutritional status Outcome: Progressing   Problem: Respiratory: Goal: Will regain and/or maintain adequate ventilation Outcome: Progressing Goal: Respiratory status will improve Outcome: Progressing   "

## 2024-12-25 NOTE — Progress Notes (Signed)
 "   1 Day Post-Op  Subjective: Doing pretty well today.  Pain is controlled right now.  Foley in place.  NGT in place with no flatus or BM.  Family at bedside.   Objective: Vital signs in last 24 hours: Temp:  [97.3 F (36.3 C)-98.6 F (37 C)] 98.6 F (37 C) (01/16 0857) Pulse Rate:  [79-104] 80 (01/16 0857) Resp:  [13-25] 15 (01/16 0857) BP: (103-139)/(67-98) 110/71 (01/16 0857) SpO2:  [87 %-97 %] 89 % (01/16 0958) Weight:  [68.9 kg] 68.9 kg (01/15 1759) Last BM Date :  (pta)  Intake/Output from previous day: 01/15 0701 - 01/16 0700 In: 1390.3 [I.V.:1290.3; IV Piggyback:100] Out: 225 [Urine:200; Blood:25] Intake/Output this shift: Total I/O In: 100 [IV Piggyback:100] Out: -   PE: Gen: NAD Abd: soft, appropriately tender, lap incisions are c/d/I with band-aids.  R inguinal incision with ecchymosis and fullness/edema as expected.  Ecchymosis in scrotum as expected GU: foley in place with urine present  Lab Results:  Recent Labs    12/24/24 1251 12/25/24 0602  WBC 13.6* 10.6*  HGB 14.6 12.4*  HCT 45.4 40.0  PLT 209 168   BMET Recent Labs    12/24/24 1251 12/25/24 0602  NA 136 138  K 5.4* 4.6  CL 102 105  CO2 22 21*  GLUCOSE 126* 109*  BUN 19 18  CREATININE 1.19 1.32*  CALCIUM  10.0 8.9   PT/INR No results for input(s): LABPROT, INR in the last 72 hours. CMP     Component Value Date/Time   NA 138 12/25/2024 0602   K 4.6 12/25/2024 0602   CL 105 12/25/2024 0602   CO2 21 (L) 12/25/2024 0602   GLUCOSE 109 (H) 12/25/2024 0602   BUN 18 12/25/2024 0602   CREATININE 1.32 (H) 12/25/2024 0602   CALCIUM  8.9 12/25/2024 0602   PROT 5.5 (L) 12/25/2024 0602   ALBUMIN 3.1 (L) 12/25/2024 0602   AST 20 12/25/2024 0602   ALT 8 12/25/2024 0602   ALKPHOS 70 12/25/2024 0602   BILITOT 1.4 (H) 12/25/2024 0602   GFRNONAA 52 (L) 12/25/2024 0602   GFRAA >60 07/05/2020 0728   Lipase     Component Value Date/Time   LIPASE <10 (L) 12/24/2024 1251        Studies/Results: DG Abd Portable 1 View Result Date: 12/24/2024 EXAM: 1 VIEW XRAY OF THE ABDOMEN 12/24/2024 05:57:00 PM COMPARISON: 12/21/2024 CLINICAL HISTORY: NGT placement NGT placement NGT placement NGT placement NGT placement FINDINGS: LINES, TUBES AND DEVICES: Enteric tube in place with tip and side port projecting over the stomach. Nerve stimulator generator in left lower quadrant. BOWEL: Multiple dilated small bowel loops in abdomen in keeping with a mid to distal small bowel obstruction. No free intraperitoneal gas. SOFT TISSUES: Surgical clips in right upper quadrant. No abnormal calcifications. BONES: No acute fracture. PLEURAL SPACES: Small left pleural effusion. LUNGS: Bibasilar atelectasis. IMPRESSION: 1. Enteric tube tip and side port project over the stomach. 2. Mid to distal small bowel obstruction without free intraperitoneal gas. 3. Small left pleural effusion. Electronically signed by: Dorethia Molt MD 12/24/2024 06:02 PM EST RP Workstation: [id number]    Anti-infectives: Anti-infectives (From admission, onward)    Start     Dose/Rate Route Frequency Ordered Stop   12/25/24 1600  vancomycin  (VANCOCIN ) IVPB 1000 mg/200 mL premix  Status:  Discontinued        1,000 mg 200 mL/hr over 60 Minutes Intravenous Every 24 hours 12/25/24 0010 12/25/24 0909   12/25/24 1600  vancomycin  (  VANCOREADY) IVPB 750 mg/150 mL        750 mg 150 mL/hr over 60 Minutes Intravenous Every 24 hours 12/25/24 0909     12/25/24 0600  ceFEPIme  (MAXIPIME ) 2 g in sodium chloride  0.9 % 100 mL IVPB        2 g 200 mL/hr over 30 Minutes Intravenous Every 12 hours 12/25/24 0008     12/24/24 1715  ceFAZolin  (ANCEF ) IVPB 2g/100 mL premix       Placed in And Linked Group   2 g 200 mL/hr over 30 Minutes Intravenous On call to O.R. 12/24/24 1639 12/24/24 1831   12/24/24 1715  metroNIDAZOLE  (FLAGYL ) IVPB 500 mg       Placed in And Linked Group   500 mg 100 mL/hr over 60 Minutes Intravenous On call to  O.R. 12/24/24 1639 12/24/24 1843   12/24/24 1445  cefTRIAXone  (ROCEPHIN ) 1 g in sodium chloride  0.9 % 100 mL IVPB  Status:  Discontinued        1 g 200 mL/hr over 30 Minutes Intravenous  Once 12/24/24 1433 12/24/24 1440   12/24/24 1445  vancomycin  (VANCOCIN ) IVPB 1000 mg/200 mL premix  Status:  Discontinued        1,000 mg 200 mL/hr over 60 Minutes Intravenous  Once 12/24/24 1441 12/24/24 1443   12/24/24 1445  piperacillin -tazobactam (ZOSYN ) IVPB 3.375 g        3.375 g 100 mL/hr over 30 Minutes Intravenous  Once 12/24/24 1441 12/24/24 1554   12/24/24 1445  vancomycin  (VANCOREADY) IVPB 1250 mg/250 mL        1,250 mg 166.7 mL/hr over 90 Minutes Intravenous  Once 12/24/24 1443 12/24/24 1807        Assessment/Plan POD 1, s/p open RIH with vicryl mesh and dx lap, Dr. Ebbie 1/15 -cont NGT and await bowel function -leave foley today due to edema in groin, hx of BPH, and hernia repair.  Will plan to remove tomorrow and see how he does -mobilize, PT eval -multi-modal pain management  -hgb 12.4. -Kcentra  for reversal of Eliquis  last night -family at bedside and discussed plans and recommendations  FEN - NPO/NGT/IVFs VTE - do not restart anticoag for at least 48 hrs post op ID - no further needed for abx, on maxipime  and vanc for HCAP Foley - leave today, DC 1/17  HCAP COPD A fib BPH CHF nephrolithiasis   LOS: 1 day    Burnard FORBES Banter , [facility] 12/25/2024, 11:12 AM Please see Amion for pager number during day hours 7:00am-4:30pm or 7:00am -11:30am on weekends  "

## 2024-12-25 NOTE — Progress Notes (Signed)
 " [facility] Progress Note  Patient: Maurice Wood     FMW:[id number]  DOA: 12/24/2024   PCP: Seabron Alm, MD       Brief hospital course: Maurice Wood is a 89 y.o. male with medical history of A fib, COPD, nephrolithiasis, Chronic pain who is sent from the urology office for an inguinal hernia that was difficult to reduce. On 1/12 he shock wave lithotripsy was going to be performed on an 11 mm UPJ stone in left renal pelvis but he was noted to be hypoxic and the procedure was aborted.  He was then discharged home. He subsequently began to have vomiting and developed pain in right lower abdomen. He continued to have this pain and vomiting or dry heaving for days. Today he followed up with urology, underwent a CT scan, was found to have an incarcerated R inguinal hernia and was recommended to come to the ED.   Subjective:  He has no complaints. Not passing gas.   Assessment and Plan:  Principal Problem:   Incarcerated inguinal hernia  - 1/15- repair with Vicryl mesh  - NG tube present- bowel sounds poor   Active Problems:     Hypoxia- h/o COPD and ILD- possible pneumonia -  87% on RA in ED- he has never required O2 in the past - has a h/o mild pulm fibrosis and COPD - has LLL infiltrate - receiving Zosyn  and Vanc in ED- dc Vanc - pulse ox 89% on 2 L today- cont to follow  Delrium - after anaesthesia- has resolved  AKI - Cr 1.19 > 1.32 - cont IVF and follow   Left sided nephrolithiasis - will need to f/u with urology     Atrial fibrillation, permanent  -  received KCentra  prior to surgery as he took Eliquis  the night before - not on rate controlling agent - rate still controlled - hold anticoagulation for 48 hrs after surgery     COPD (chronic obstructive pulmonary disease)   - currently no wheezing - PRN nebs   OSA - CPAP ordered   H/O CHF? Possibly diastolic - takes Lasix  once a week on Mondays          Code Status: Full Code Total time on patient  care: 35 DVT prophylaxis:  SCDs    Objective:   Vitals:   12/25/24 0511 12/25/24 0857 12/25/24 0958 12/25/24 1206  BP: 120/82 110/71  114/74  Pulse: 80 80  85  Resp: 16 15  13   Temp: (!) 97.3 F (36.3 C) 98.6 F (37 C)  97.8 F (36.6 C)  TempSrc: Oral Oral  Oral  SpO2: 92% 92% (!) 89% 99%  Weight:      Height:       Filed Weights   12/24/24 1229 12/24/24 1759  Weight: 68.9 kg 68.9 kg   Exam: General exam: Appears comfortable  HEENT: oral mucosa moist Respiratory system: Clear to auscultation.  Cardiovascular system: S1 & S2 heard  Gastrointestinal system: Abdomen soft, non-tender, nondistended. No bowel sounds.  Extremities: No cyanosis, clubbing or edema Psychiatry:  Mood & affect appropriate.      CBC: Recent Labs  Lab 12/24/24 1251 12/25/24 0602  WBC 13.6* 10.6*  HGB 14.6 12.4*  HCT 45.4 40.0  MCV 90.8 93.2  PLT 209 168   Basic Metabolic Panel: Recent Labs  Lab 12/24/24 1251 12/25/24 0602  NA 136 138  K 5.4* 4.6  CL 102 105  CO2 22 21*  GLUCOSE 126* 109*  BUN 19 18  CREATININE 1.19 1.32*  CALCIUM  10.0 8.9     Scheduled Meds:  budesonide  (PULMICORT ) nebulizer solution  0.5 mg Nebulization BID   Chlorhexidine  Gluconate Cloth  6 each Topical Daily   ipratropium-albuterol   3 mL Nebulization TID   methocarbamol  (ROBAXIN ) injection  500 mg Intravenous Q8H    Imaging and lab data personally reviewed   Author: Pantera Winterrowd  12/25/2024 12:50 PM  To contact [facility]>   Check the care team in [facility] and look for the attending/consulting [facility] provider listed  Log into [email or web address] and use [place]'s universal password   Go to> [facility]  and find provider  If you still have difficulty reaching the provider, please page the [facility] (Director on Call) for the Hospitalists listed on amion     "

## 2024-12-25 NOTE — Progress Notes (Signed)
 Pharmacy Antibiotic Note  Maurice Wood is a 89 y.o. male who was instructed by his urologist to come to the ED on 12/24/2024 for management of incarcerated hernia.  He underwent right inguinal hernia repair on 12/24/24.  Vancomycin  and cefepime  started post-op for suspected PNA.  Today, 12/25/2024: - wbc 10.6 - scr up 1.32 (crcl~37)  Plan: - adjust vancomycin  to 750 mg IV q24h for AUC 430 - cefepime  2gm q12h - f/u MRSA PCR ______________________________________________  Height: 5' 10 (177.8 cm) Weight: 68.9 kg (151 lb 14.4 oz) IBW/kg (Calculated) : 73  Temp (24hrs), Avg:98 F (36.7 C), Min:97.3 F (36.3 C), Max:98.6 F (37 C)  Recent Labs  Lab 12/24/24 1251 12/25/24 0602  WBC 13.6* 10.6*  CREATININE 1.19 1.32*    Estimated Creatinine Clearance: 37 mL/min (A) (by C-G formula based on SCr of 1.32 mg/dL (H)).    Allergies[1]   Thank you for allowing pharmacy to be a part of this patients care.  Osie Iantha SQUIBB 12/25/2024 9:02 AM     [1]  Allergies Allergen Reactions   Epinephrine Palpitations and Other (See Comments)    Increased Heart Rate, also   Nsaids Other (See Comments)    NOT WHILE TAKING ELIQUIS     Clarithromycin Other (See Comments)    Headaches    Iodinated Contrast Media Other (See Comments)    Pt states turned bright red   Nimodipine     Flushing   Pneumococcal Vaccines Hives and Rash

## 2024-12-26 DIAGNOSIS — K4030 Unilateral inguinal hernia, with obstruction, without gangrene, not specified as recurrent: Secondary | ICD-10-CM | POA: Diagnosis not present

## 2024-12-26 LAB — BASIC METABOLIC PANEL WITH GFR
Anion gap: 11 (ref 5–15)
BUN: 16 mg/dL (ref 8–23)
CO2: 21 mmol/L — ABNORMAL LOW (ref 22–32)
Calcium: 9.2 mg/dL (ref 8.9–10.3)
Chloride: 108 mmol/L (ref 98–111)
Creatinine, Ser: 1.1 mg/dL (ref 0.61–1.24)
GFR, Estimated: >60 mL/min
Glucose, Bld: 86 mg/dL (ref 70–99)
Potassium: 4.6 mmol/L (ref 3.5–5.1)
Sodium: 141 mmol/L (ref 135–145)

## 2024-12-26 LAB — CBC
HCT: 38.8 % — ABNORMAL LOW (ref 39.0–52.0)
Hemoglobin: 12.1 g/dL — ABNORMAL LOW (ref 13.0–17.0)
MCH: 29.2 pg (ref 26.0–34.0)
MCHC: 31.2 g/dL (ref 30.0–36.0)
MCV: 93.7 fL (ref 80.0–100.0)
Platelets: 161 K/uL (ref 150–400)
RBC: 4.14 MIL/uL — ABNORMAL LOW (ref 4.22–5.81)
RDW: 14.1 % (ref 11.5–15.5)
WBC: 8.9 K/uL (ref 4.0–10.5)
nRBC: 0 % (ref 0.0–0.2)

## 2024-12-26 MED ORDER — SALINE SPRAY 0.65 % NA SOLN: 1.0000 | Freq: Four times a day (QID) | NASAL | Status: DC | PRN

## 2024-12-26 MED ORDER — MENTHOL 3 MG MT LOZG: 1.0000 | LOZENGE | OROMUCOSAL | Status: DC | PRN

## 2024-12-26 MED ORDER — BISACODYL 10 MG RE SUPP
10.0000 mg | Freq: Every day | RECTAL | Status: DC
  Administered 2024-12-26 – 2024-12-28 (×2): 10 mg via RECTAL
  Filled 2024-12-26 (×5): qty 1

## 2024-12-26 MED ORDER — SIMETHICONE 40 MG/0.6ML PO SUSP: 80.0000 mg | Freq: Four times a day (QID) | ORAL | Status: DC | PRN

## 2024-12-26 MED ORDER — LACTATED RINGERS IV BOLUS: 1000.0000 mL | Freq: Three times a day (TID) | INTRAVENOUS | Status: DC | PRN

## 2024-12-26 MED ORDER — METHOCARBAMOL 1000 MG/10ML IJ SOLN: 1000.0000 mg | Freq: Four times a day (QID) | INTRAMUSCULAR | Status: DC | PRN

## 2024-12-26 MED ORDER — ALUM & MAG HYDROXIDE-SIMETH 200-200-20 MG/5ML PO SUSP: 30.0000 mL | Freq: Four times a day (QID) | ORAL | Status: DC | PRN

## 2024-12-26 MED ORDER — PHENOL 1.4 % MT LIQD: 2.0000 | OROMUCOSAL | Status: DC | PRN

## 2024-12-26 MED ORDER — NAPHAZOLINE-GLYCERIN 0.012-0.25 % OP SOLN: 1.0000 [drp] | Freq: Four times a day (QID) | OPHTHALMIC | Status: DC | PRN

## 2024-12-26 MED ORDER — ACETAMINOPHEN 650 MG RE SUPP: 650.0000 mg | Freq: Four times a day (QID) | RECTAL | Status: DC | PRN

## 2024-12-26 MED ORDER — MAGIC MOUTHWASH: 15.0000 mL | Freq: Four times a day (QID) | ORAL | Status: DC | PRN

## 2024-12-26 NOTE — Progress Notes (Signed)
" °   12/26/24 1012  Provider Notification  Provider Name/Title Gross  Date Provider Notified 12/26/24  Time Provider Notified 1012  Method of Notification Page  Notification Reason Other (Comment) (pt removed NG tube and requesting to keep it out)  Provider response See new orders  Date of Provider Response 12/26/24  Time of Provider Response 1013    "

## 2024-12-26 NOTE — Evaluation (Signed)
 Physical Therapy Evaluation Patient Details Name: Maurice Wood MRN: [id number] DOB: [birth year] Today's Date: 12/26/2024  History of Present Illness  89 y.o. male who is sent from the urology office for an inguinal hernia that was difficult to reduce.  Pt s/p open RIH with vicryl mesh and dx lap, Dr. Ebbie 1/15. EFY:jumpjo fibrillation,OSA, HTN, small intracranial aneurysms with spontaneous dissection of the right internal carotid artery in 2002, COPD, ILD, HLD, GERD, BPH, Chronic pain  Clinical Impression  Pt admitted with above diagnosis.  Pt currently with functional limitations due to the deficits listed below (see PT Problem List). Pt will benefit from acute skilled PT to increase their independence and safety with mobility to allow discharge.   Pt had loose BM in recliner on arrival to room.  Pt assisted with pericare and changing linen.  RN aware of BM and adjusted O2 since pt's SPO2 dropped to 86% on room air during ambulation (see mobility section below).  Pt has good family support from spouse and son, and pt would benefit from HHPT upon d/c.         If plan is discharge home, recommend the following: Assistance with cooking/housework;Assist for transportation;Help with stairs or ramp for entrance   Can travel by private vehicle        Equipment Recommendations None recommended by PT  Recommendations for Other Services       Functional Status Assessment Patient has had a recent decline in their functional status and demonstrates the ability to make significant improvements in function in a reasonable and predictable amount of time.     Precautions / Restrictions Precautions Precautions: Fall Precaution/Restrictions Comments: incontinence? monitor sats      Mobility  Bed Mobility               General bed mobility comments: pt in recliner    Transfers Overall transfer level: Needs assistance Equipment used: Rolling walker (2 wheels) Transfers: Sit to/from  Stand Sit to Stand: Min assist           General transfer comment: verbal cues for hand placement, assist to rise and stabilize; pt able to stand approx 4-5 minutes for pericare (had loose BM in chair which ran down onto floor)    Ambulation/Gait Ambulation/Gait assistance: Contact guard assist Gait Distance (Feet): 160 Feet Assistive device: Rolling walker (2 wheels) Gait Pattern/deviations: Step-through pattern, Decreased stride length       General Gait Details: verbal cues for use of RW, pt reports moderate dyspnea, SPO2 88% on room air after 80 ft and improved to 91% to ambulate back to room however dropped to 86% room air upon sitting in recliner (RN notified and came to room and applied O2)  Stairs            Wheelchair Mobility     Tilt Bed    Modified Rankin (Stroke Patients Only)       Balance Overall balance assessment: Mild deficits observed, not formally tested                                           Pertinent Vitals/Pain Pain Assessment Pain Assessment: Faces Faces Pain Scale: Hurts little more Pain Location: scrotal area - edematous Pain Descriptors / Indicators: Tender, Sore Pain Intervention(s): Repositioned, Monitored during session    Home Living Family/patient expects to be discharged to:: Private residence Living Arrangements: Spouse/significant  other Available Help at Discharge: Family;Available PRN/intermittently Type of Home: House Home Access: Stairs to enter Entrance Stairs-Rails: None Entrance Stairs-Number of Steps: 2   Home Layout: One level Home Equipment: Cane - single Librarian, Academic (2 wheels)      Prior Function Prior Level of Function : Independent/Modified Independent             Mobility Comments: uses cane for ambulation       Extremity/Trunk Assessment        Lower Extremity Assessment Lower Extremity Assessment: Generalized weakness    Cervical / Trunk  Assessment Cervical / Trunk Assessment: Normal  Communication   Communication Communication: Impaired Factors Affecting Communication: Hearing impaired    Cognition Arousal: Alert Behavior During Therapy: WFL for tasks assessed/performed   PT - Cognitive impairments: No apparent impairments                         Following commands: Intact       Cueing       General Comments      Exercises     Assessment/Plan    PT Assessment Patient needs continued PT services  PT Problem List Decreased strength;Decreased activity tolerance;Decreased balance;Decreased knowledge of use of DME;Decreased mobility       PT Treatment Interventions DME instruction;Therapeutic exercise;Gait training;Balance training;Stair training;Functional mobility training;Therapeutic activities;Patient/family education    PT Goals (Current goals can be found in the Care Plan section)  Acute Rehab PT Goals PT Goal Formulation: With patient/family Time For Goal Achievement: 01/09/25 Potential to Achieve Goals: Good    Frequency Min 2X/week     Co-evaluation               AM-PAC PT 6 Clicks Mobility  Outcome Measure Help needed turning from your back to your side while in a flat bed without using bedrails?: A Little Help needed moving from lying on your back to sitting on the side of a flat bed without using bedrails?: A Little Help needed moving to and from a bed to a chair (including a wheelchair)?: A Little Help needed standing up from a chair using your arms (e.g., wheelchair or bedside chair)?: A Little Help needed to walk in hospital room?: A Little Help needed climbing 3-5 steps with a railing? : A Little 6 Click Score: 18    End of Session Equipment Utilized During Treatment: Gait belt Activity Tolerance: Patient tolerated treatment well Patient left: with call bell/phone within reach;in chair;with family/visitor present;with nursing/sitter in room   PT Visit  Diagnosis: Difficulty in walking, not elsewhere classified (R26.2)    Time: 8783-8756 PT Time Calculation (min) (ACUTE ONLY): 27 min   Charges:   PT Evaluation $PT Eval Low Complexity: 1 Low PT Treatments $Gait Training: 8-22 mins PT General Charges $$ ACUTE PT VISIT: 1 Visit        Maurice Wood, DPT Physical Therapist Acute Rehabilitation Services Office: [phone]    Maurice CROME Payson 12/26/2024, 1:45 PM

## 2024-12-26 NOTE — Progress Notes (Signed)
 12/26/2024  Maurice Wood [id number] May 05, 1933  CARE TEAM: PCP: Maurice Alm, MD  Outpatient Care Team: Patient Care Team: Maurice Alm, MD as PCP - General (Family Medicine)  Inpatient Treatment Team: Treatment Team:  Rizwan, Saima, MD Ccs, Md, MD Maurice Cough, MD Maurice Loupe, MD Payson, Maurice Wood, PT Maurice Wood, NT Maurice Suzen BIRCH, RN Maurice Edsel SAILOR, RN Maurice Wood, [facility] Maurice Lauraine BROCKS, RN   Problem List:   Principal Problem:   Incarcerated inguinal hernia Active Problems:   OBSTRUCTIVE SLEEP APNEA   Atrial fibrillation, permanent (HCC)   COPD (chronic obstructive pulmonary disease) (HCC)   12/24/2024  Preoperative diagnosis: Incarcerated wrangle hernia with small bowel obstruction  Postoperative diagnosis: Same as above  Procedure: 1.  Right inguinal hernia repair with Vicryl mesh 2.  Diagnostic laparoscopy  Surgeon: Dr. Adina Wood    Assessment Maurice Wood = 2 days) 2 Days Post-Op    Stable    Plan: Assessment/Plan POD 2, s/p open RIH with vicryl mesh and dx lap, Dr. Bury 1/15 -NGT and await return of bowel function - DC Foley catheter since postop day 2.  May need it backend but try to avoid.  hx of BPH, and hernia repair.  -mobilize, PT eval -multi-modal pain management  -hgb stable.   -Kcentra  for reversal of Eliquis  last night -family at bedside and discussed plans and recommendations -Needs to get out of bed more.  Will ask physical therapy to help evaluate.  Family at bedside and motivated as well.   FEN - NPO/NGT/IVFs VTE - do not restart anticoag for at least 48 hrs post op.  Since ileus not resolved, may need to give IV.  Defer to medicine  ID -23-hour perioperative coverage.  No need for further antibiotics from surgery standpoint.  Defer to medicine.  Looks like they are continuing cefepime  and vancomycin  for HCAP Foley - D/C 1/17   HCAP COPD A  fib BPH CHF nephrolithiasis       I reviewed nursing notes, hospitalist notes, last 24 h vitals and pain scores, last 48 h intake and output, last 24 h labs and trends, and last 24 h imaging results.  I have reviewed this patient's available data, including medical history, events of note, test results, etc as part of my evaluation.   A significant portion of that time was spent in counseling. Care during the described time interval was provided by me.  This care required moderate level of medical decision making.  12/26/2024    Subjective: (Chief complaint)  Wife and son at bedside.  Patient complaining of sore groin but is otherwise okay.  No flatus.  Has gotten out of bed but not walking much in the hallways.  Objective:  Vital signs:  Vitals:   12/25/24 2030 12/25/24 2114 12/26/24 0549 12/26/24 0847  BP:  114/76 118/70   Pulse:  92 86   Resp: 18 15 16    Temp:  (!) 97.5 F (36.4 C) 97.9 F (36.6 C)   TempSrc:  Oral Oral   SpO2:  99% 97% 92%  Weight:      Height:        Last BM Date :  (pta)  Intake/Output   Yesterday:  01/16 0701 - 01/17 0700 In: 800.9 [I.V.:500.9; IV Piggyback:300] Out: 1075 [Urine:925; Emesis/NG output:150] This shift:  No intake/output data recorded.  Bowel function:  Flatus: No  BM:  No  Drain: Thinly bilious.   Physical Exam:  General: Pt awake/alert in  acute distress Eyes: PERRL, normal EOM.  Sclera clear.  No icterus Neuro: CN II-XII intact w/o focal sensory/motor deficits. Lymph: No head/neck/groin lymphadenopathy Psych:  No delerium/psychosis/paranoia.  Oriented x 4 HENT: Normocephalic, Mucus membranes moist.  No thrush Neck: Supple, No tracheal deviation.  No obvious thyromegaly Chest: No pain to chest wall compression.  Good respiratory excursion.  No audible wheezing CV:  Pulses intact.  Regular rhythm.  No major extremity edema MS: Normal AROM mjr joints.  No obvious deformity Abdomen: Soft.  Nondistended.   Mildly tender at incisions only.  No evidence of peritonitis.  No incarcerated hernias.  GU: Significant groin ecchymosis with edema of right groin incision.  Dressing removed.  Incision clean dry and intact.  No obvious recurrent hernia.  Left inguinal hernia reducible.  Foley catheter in place with clear yellow urine in bag  Ext:  No deformity.  No mjr edema.  No cyanosis Skin: No petechiae / purpurea.  No major sores.  Warm and dry    Results:   Cultures: Recent Results (from the past 720 hours)  MRSA Next Gen by PCR, Nasal     Status: None   Collection Time: 12/25/24 11:00 AM   Specimen: Nasal Mucosa; Nasal Swab  Result Value Ref Range Status   MRSA by PCR Next Gen NOT DETECTED NOT DETECTED Final    Comment: (NOTE) The GeneXpert MRSA Assay (FDA approved for NASAL specimens only), is one component of a comprehensive MRSA colonization surveillance program. It is not intended to diagnose MRSA infection nor to guide or monitor treatment for MRSA infections. Test performance is not FDA approved in patients less than 63 years old. Performed at [facility], [address], [place], [place] [address]     Labs: Results for orders placed or performed during the hospital encounter of 12/24/24 (from the past 48 hours)  Lipase, blood     Status: Abnormal   Collection Time: 12/24/24 12:51 PM  Result Value Ref Range   Lipase <10 (L) 11 - 51 U/L    Comment: HEMOLYZED SPECIMEN - SUGGEST RECOLLECT Slight hemolysis  Performed at [facility], [address], [place], [place] [address]   Comprehensive metabolic panel     Status: Abnormal   Collection Time: 12/24/24 12:51 PM  Result Value Ref Range   Sodium 136 135 - 145 mmol/L   Potassium 5.4 (H) 3.5 - 5.1 mmol/L    Comment: HEMOLYSIS AT THIS LEVEL MAY AFFECT RESULT   Chloride 102 98 - 111 mmol/L   CO2 22 22 - 32 mmol/L   Glucose, Bld 126 (H) 70 - 99 mg/dL    Comment: Glucose reference range  applies only to samples taken after fasting for at least 8 hours.   BUN 19 8 - 23 mg/dL   Creatinine, Ser 8.80 0.61 - 1.24 mg/dL   Calcium  10.0 8.9 - 10.3 mg/dL   Total Protein 7.4 6.5 - 8.1 g/dL   Albumin 3.8 3.5 - 5.0 g/dL   AST 40 15 - 41 U/L    Comment: HEMOLYZED SPECIMEN - SUGGEST RECOLLECT Slight hemolysis     ALT 15 0 - 44 U/L    Comment: HEMOLYZED SPECIMEN - SUGGEST RECOLLECT Slight hemolysis     Alkaline Phosphatase 81 38 - 126 U/L   Total Bilirubin 2.0 (H) 0.0 - 1.2 mg/dL   GFR, Estimated 58 (L) >60 mL/min    Comment: (NOTE) Calculated using the CKD-EPI Creatinine Equation (2021)    Anion gap  12 5 - 15    Comment: Performed at [facility], [address], [place], [place] [address]  CBC     Status: Abnormal   Collection Time: 12/24/24 12:51 PM  Result Value Ref Range   WBC 13.6 (H) 4.0 - 10.5 K/uL   RBC 5.00 4.22 - 5.81 MIL/uL   Hemoglobin 14.6 13.0 - 17.0 g/dL   HCT 54.5 60.9 - 47.9 %   MCV 90.8 80.0 - 100.0 fL   MCH 29.2 26.0 - 34.0 pg   MCHC 32.2 30.0 - 36.0 g/dL   RDW 85.9 88.4 - 84.4 %   Platelets 209 150 - 400 K/uL   nRBC 0.0 0.0 - 0.2 %    Comment: Performed at [facility], [address], [place], [place] [address]  Urinalysis, Routine w reflex microscopic -Urine, Clean Catch     Status: Abnormal   Collection Time: 12/24/24  1:27 PM  Result Value Ref Range   Color, Urine AMBER (A) YELLOW    Comment: BIOCHEMICALS MAY BE AFFECTED BY COLOR   APPearance HAZY (A) CLEAR   Specific Gravity, Urine 1.027 1.005 - 1.030   pH 5.0 5.0 - 8.0   Glucose, UA NEGATIVE NEGATIVE mg/dL   Hgb urine dipstick SMALL (A) NEGATIVE   Bilirubin Urine NEGATIVE NEGATIVE   Ketones, ur 5 (A) NEGATIVE mg/dL   Protein, ur 30 (A) NEGATIVE mg/dL   Nitrite NEGATIVE NEGATIVE   Leukocytes,Ua MODERATE (A) NEGATIVE   RBC / HPF 0-5 0 - 5 RBC/hpf   WBC, UA 11-20 0 - 5 WBC/hpf   Bacteria, UA RARE (A) NONE SEEN   Squamous Epithelial / HPF 0-5 0 - 5  /HPF   Mucus PRESENT    Hyaline Casts, UA PRESENT     Comment: Performed at [facility], [address] [place], [place] [address]  Comprehensive metabolic panel with GFR     Status: Abnormal   Collection Time: 12/25/24  6:02 AM  Result Value Ref Range   Sodium 138 135 - 145 mmol/L   Potassium 4.6 3.5 - 5.1 mmol/L   Chloride 105 98 - 111 mmol/L   CO2 21 (L) 22 - 32 mmol/L   Glucose, Bld 109 (H) 70 - 99 mg/dL    Comment: Glucose reference range applies only to samples taken after fasting for at least 8 hours.   BUN 18 8 - 23 mg/dL   Creatinine, Ser 8.67 (H) 0.61 - 1.24 mg/dL   Calcium  8.9 8.9 - 10.3 mg/dL   Total Protein 5.5 (L) 6.5 - 8.1 g/dL   Albumin 3.1 (L) 3.5 - 5.0 g/dL   AST 20 15 - 41 U/L   ALT 8 0 - 44 U/L   Alkaline Phosphatase 70 38 - 126 U/L   Total Bilirubin 1.4 (H) 0.0 - 1.2 mg/dL   GFR, Estimated 52 (L) >60 mL/min    Comment: (NOTE) Calculated using the CKD-EPI Creatinine Equation (2021)    Anion gap 12 5 - 15    Comment: Performed at [facility], [address], [place], [place] [address]  CBC     Status: Abnormal   Collection Time: 12/25/24  6:02 AM  Result Value Ref Range   WBC 10.6 (H) 4.0 - 10.5 K/uL   RBC 4.29 4.22 - 5.81 MIL/uL   Hemoglobin 12.4 (L) 13.0 - 17.0 g/dL   HCT 59.9 60.9 - 47.9 %   MCV 93.2 80.0 - 100.0 fL  MCH 28.9 26.0 - 34.0 pg   MCHC 31.0 30.0 - 36.0 g/dL   RDW 85.7 88.4 - 84.4 %   Platelets 168 150 - 400 K/uL   nRBC 0.0 0.0 - 0.2 %    Comment: Performed at [facility], [address], [place], [place] [address]  MRSA Next Gen by PCR, Nasal     Status: None   Collection Time: 12/25/24 11:00 AM   Specimen: Nasal Mucosa; Nasal Swab  Result Value Ref Range   MRSA by PCR Next Gen NOT DETECTED NOT DETECTED    Comment: (NOTE) The GeneXpert MRSA Assay (FDA approved for NASAL specimens only), is one component of a comprehensive MRSA colonization surveillance program. It is not  intended to diagnose MRSA infection nor to guide or monitor treatment for MRSA infections. Test performance is not FDA approved in patients less than 18 years old. Performed at [facility], [address], [place], [place] [address]   CBC     Status: Abnormal   Collection Time: 12/26/24  5:20 AM  Result Value Ref Range   WBC 8.9 4.0 - 10.5 K/uL   RBC 4.14 (L) 4.22 - 5.81 MIL/uL   Hemoglobin 12.1 (L) 13.0 - 17.0 g/dL   HCT 61.1 (L) 60.9 - 47.9 %   MCV 93.7 80.0 - 100.0 fL   MCH 29.2 26.0 - 34.0 pg   MCHC 31.2 30.0 - 36.0 g/dL   RDW 85.8 88.4 - 84.4 %   Platelets 161 150 - 400 K/uL   nRBC 0.0 0.0 - 0.2 %    Comment: Performed at [facility], [address], [place], [place] [address]  Basic metabolic panel     Status: Abnormal   Collection Time: 12/26/24  5:20 AM  Result Value Ref Range   Sodium 141 135 - 145 mmol/L   Potassium 4.6 3.5 - 5.1 mmol/L   Chloride 108 98 - 111 mmol/L   CO2 21 (L) 22 - 32 mmol/L   Glucose, Bld 86 70 - 99 mg/dL    Comment: Glucose reference range applies only to samples taken after fasting for at least 8 hours.   BUN 16 8 - 23 mg/dL   Creatinine, Ser 8.89 0.61 - 1.24 mg/dL   Calcium  9.2 8.9 - 10.3 mg/dL   GFR, Estimated >39 >39 mL/min    Comment: (NOTE) Calculated using the CKD-EPI Creatinine Equation (2021)    Anion gap 11 5 - 15    Comment: Performed at [facility], [address], [place], [place] [address]    Imaging / Studies: DG Abd Portable 1 View Result Date: 12/24/2024 EXAM: 1 VIEW XRAY OF THE ABDOMEN 12/24/2024 05:57:00 PM COMPARISON: 12/21/2024 CLINICAL HISTORY: NGT placement NGT placement NGT placement NGT placement NGT placement FINDINGS: LINES, TUBES AND DEVICES: Enteric tube in place with tip and side port projecting over the stomach. Nerve stimulator generator in left lower quadrant. BOWEL: Multiple dilated small bowel loops in abdomen in keeping with a mid to distal small  bowel obstruction. No free intraperitoneal gas. SOFT TISSUES: Surgical clips in right upper quadrant. No abnormal calcifications. BONES: No acute fracture. PLEURAL SPACES: Small left pleural effusion. LUNGS: Bibasilar atelectasis. IMPRESSION: 1. Enteric tube tip and side port project over the stomach. 2. Mid to distal small bowel obstruction without free intraperitoneal gas. 3. Small left pleural effusion. Electronically signed by: Dorethia Molt MD 12/24/2024 06:02 PM EST RP Workstation: [id number]    Medications / Allergies:  per chart  Antibiotics: Anti-infectives (From admission, onward)    Start     Dose/Rate Route Frequency Ordered Stop   12/25/24 1600  vancomycin  (VANCOCIN ) IVPB 1000 mg/200 mL premix  Status:  Discontinued        1,000 mg 200 mL/hr over 60 Minutes Intravenous Every 24 hours 12/25/24 0010 12/25/24 0909   12/25/24 1600  vancomycin  (VANCOREADY) IVPB 750 mg/150 mL  Status:  Discontinued        750 mg 150 mL/hr over 60 Minutes Intravenous Every 24 hours 12/25/24 0909 12/25/24 1253   12/25/24 0600  ceFEPIme  (MAXIPIME ) 2 g in sodium chloride  0.9 % 100 mL IVPB        2 g 200 mL/hr over 30 Minutes Intravenous Every 12 hours 12/25/24 0008     12/24/24 1715  ceFAZolin  (ANCEF ) IVPB 2g/100 mL premix       Placed in And Linked Group   2 g 200 mL/hr over 30 Minutes Intravenous On call to O.R. 12/24/24 1639 12/24/24 1831   12/24/24 1715  metroNIDAZOLE  (FLAGYL ) IVPB 500 mg       Placed in And Linked Group   500 mg 100 mL/hr over 60 Minutes Intravenous On call to O.R. 12/24/24 1639 12/24/24 1843   12/24/24 1445  cefTRIAXone  (ROCEPHIN ) 1 g in sodium chloride  0.9 % 100 mL IVPB  Status:  Discontinued        1 g 200 mL/hr over 30 Minutes Intravenous  Once 12/24/24 1433 12/24/24 1440   12/24/24 1445  vancomycin  (VANCOCIN ) IVPB 1000 mg/200 mL premix  Status:  Discontinued        1,000 mg 200 mL/hr over 60 Minutes Intravenous  Once 12/24/24 1441 12/24/24 1443   12/24/24 1445   piperacillin -tazobactam (ZOSYN ) IVPB 3.375 g        3.375 g 100 mL/hr over 30 Minutes Intravenous  Once 12/24/24 1441 12/24/24 1554   12/24/24 1445  vancomycin  (VANCOREADY) IVPB 1250 mg/250 mL        1,250 mg 166.7 mL/hr over 90 Minutes Intravenous  Once 12/24/24 1443 12/24/24 1807         Note: Portions of this report may have been transcribed using voice recognition software. Every effort was made to ensure accuracy; however, inadvertent computerized transcription errors may be present.   Any transcriptional errors that result from this process are unintentional.    Elspeth KYM Schultze, MD, FACS, MASCRS Esophageal, Gastrointestinal & Colorectal Surgery Robotic and Minimally Invasive Surgery  [facility] [address] [place], [place] [address] [phone] Fax [phone] Main  CONTACT INFORMATION: Weekday (9AM-5PM): Call [facility] main office at [phone] Weeknight (5PM-9AM) or Weekend/Holiday: Check EPIC Web Links tab & use AMION (password  [email or web address]) for General Surgery [facility] coverage  Please, DO NOT use SecureChat  (it is not reliable communication to reach operating surgeons & will lead to a delay in care).   Epic staff messaging available for outpatient concerns needing 1-2 business day response.      12/26/2024  9:06 AM

## 2024-12-26 NOTE — Plan of Care (Signed)
" °  Problem: Coping: Goal: Level of anxiety will decrease Outcome: Progressing   Problem: Bowel/Gastric: Goal: Gastrointestinal status for postoperative course will improve Outcome: Progressing   "

## 2024-12-26 NOTE — Progress Notes (Addendum)
 Pt instructed on risks of N/V, ileus, and aspiration from leaving NG tube out, verbalized that he would like to try not having NG tube replaced but would be agreeable to it should N/V begin, states he is also passing gas, family at bedside and aware and also agreeable to leaving NG tube out at this time if possible

## 2024-12-26 NOTE — Progress Notes (Signed)
" °   12/26/24 2026  BiPAP/CPAP/SIPAP  Reason BIPAP/CPAP not in use Other(comment) (not wearing due to him having NG tube. The NG tube is gone now but he want to go without cpap tonight.)  BiPAP/CPAP /SiPAP Vitals  Resp 20  MEWS Score/Color  MEWS Score 0  MEWS Score Color Green    "

## 2024-12-26 NOTE — Progress Notes (Signed)
 Patient pulled NG tube out.  Does not want to back in.  Called by nurse.  Surgery recommends keeping nasogastric tube decompression given high risk of ileus.

## 2024-12-26 NOTE — Plan of Care (Signed)
" °  Problem: Education: Goal: Knowledge of General Education information will improve Description: Including pain rating scale, medication(s)/side effects and non-pharmacologic comfort measures Outcome: Progressing   Problem: Health Behavior/Discharge Planning: Goal: Ability to manage health-related needs will improve Outcome: Progressing   Problem: Clinical Measurements: Goal: Ability to maintain clinical measurements within normal limits will improve Outcome: Progressing Goal: Will remain free from infection Outcome: Progressing Goal: Diagnostic test results will improve Outcome: Progressing Goal: Respiratory complications will improve Outcome: Progressing Goal: Cardiovascular complication will be avoided Outcome: Progressing   Problem: Activity: Goal: Risk for activity intolerance will decrease Outcome: Progressing   Problem: Nutrition: Goal: Adequate nutrition will be maintained Outcome: Progressing   Problem: Coping: Goal: Level of anxiety will decrease Outcome: Progressing   Problem: Elimination: Goal: Will not experience complications related to bowel motility Outcome: Progressing Goal: Will not experience complications related to urinary retention Outcome: Progressing   Problem: Pain Managment: Goal: General experience of comfort will improve and/or be controlled Outcome: Progressing   Problem: Safety: Goal: Ability to remain free from injury will improve Outcome: Progressing   Problem: Skin Integrity: Goal: Risk for impaired skin integrity will decrease Outcome: Progressing   Problem: Education: Goal: Knowledge of the prescribed therapeutic regimen will improve Outcome: Progressing   Problem: Bowel/Gastric: Goal: Gastrointestinal status for postoperative course will improve Outcome: Progressing   Problem: Cardiac: Goal: Ability to maintain an adequate cardiac output Outcome: Progressing Goal: Will show no evidence of cardiac arrhythmias Outcome:  Progressing   Problem: Nutritional: Goal: Will attain and maintain optimal nutritional status Outcome: Progressing   Problem: Neurological: Goal: Will regain or maintain usual level of consciousness Outcome: Progressing   Problem: Clinical Measurements: Goal: Ability to maintain clinical measurements within normal limits Outcome: Progressing Goal: Postoperative complications will be avoided or minimized Outcome: Progressing   Problem: Respiratory: Goal: Will regain and/or maintain adequate ventilation Outcome: Progressing Goal: Respiratory status will improve Outcome: Progressing   Problem: Skin Integrity: Goal: Demonstrates signs of wound healing without infection Outcome: Progressing   Problem: Urinary Elimination: Goal: Will remain free from infection Outcome: Progressing Goal: Ability to achieve and maintain adequate urine output Outcome: Progressing   Problem: Safety: Goal: Non-violent Restraint(s) Outcome: Progressing   "

## 2024-12-26 NOTE — Progress Notes (Signed)
 " [facility] Progress Note  Patient: Maurice Wood     FMW:[id number]  DOA: 12/24/2024   PCP: Seabron Alm, MD       Brief hospital course: Raymont Andreoni Cardella is a 89 y.o. male with medical history of A fib, COPD, nephrolithiasis, Chronic pain who is sent from the urology office for an inguinal hernia that was difficult to reduce. On 1/12 he shock wave lithotripsy was going to be performed on an 11 mm UPJ stone in left renal pelvis but he was noted to be hypoxic and the procedure was aborted.  He was then discharged home. He subsequently began to have vomiting and developed pain in right lower abdomen. He continued to have this pain and vomiting or dry heaving for days. Today he followed up with urology, underwent a CT scan, was found to have an incarcerated R inguinal hernia and was recommended to come to the ED.   Subjective:  Hehad some chest pain last night which he states resolved with Tylenol .  NG out. Passing gas and taking sips of water w/o issues.  Assessment and Plan:  Principal Problem:   Incarcerated inguinal hernia  - 1/15- repair with Vicryl mesh  - NG tube removed by patient- bowel sounds active today- diet per general surgery   Active Problems:     Hypoxia- h/o COPD and ILD- possible pneumonia -  87% on RA in ED- he has never required O2 in the past - has a h/o mild pulm fibrosis and COPD - has LLL infiltrate - receiving Zosyn  and Vanc in ED>  Cefepime  after surgery - pulse ox 89%  on 1/15 - today is not hypoxic at rest  Delrium - after anaesthesia- has resolved  AKI - Cr 1.19 > 1.32> 1.10 -  cont IVF as he is still NPO   Left sided nephrolithiasis - asymptomatic - will need to f/u with urology     Atrial fibrillation, permanent  -  received KCentra  prior to surgery as he took Eliquis  the night before - not on rate controlling agent - rate still controlled - hold anticoagulation until ok to resume per general sugery     COPD (chronic obstructive  pulmonary disease)   - currently no wheezing - PRN nebs   OSA - CPAP ordered   H/O CHF? Possibly diastolic - takes Lasix  once a week on Mondays          Code Status: Full Code Total time on patient care: 35 DVT prophylaxis:  Place and maintain sequential compression device Start: 12/25/24 1256SCDs    Objective:   Vitals:   12/25/24 2114 12/26/24 0549 12/26/24 0847 12/26/24 1203  BP: 114/76 118/70  119/76  Pulse: 92 86  81  Resp: 15 16  16   Temp: (!) 97.5 F (36.4 C) 97.9 F (36.6 C)  98 F (36.7 C)  TempSrc: Oral Oral  Oral  SpO2: 99% 97% 92% 91%  Weight:      Height:       Filed Weights   12/24/24 1229 12/24/24 1759  Weight: 68.9 kg 68.9 kg   Exam: General exam: Appears comfortable  HEENT: oral mucosa moist Respiratory system: Clear to auscultation.  Cardiovascular system: S1 & S2 heard  Gastrointestinal system: Abdomen soft, non-tender, nondistended. Active bowel sounds Extremities: No cyanosis, clubbing or edema Psychiatry:  Mood & affect appropriate.      CBC: Recent Labs  Lab 12/24/24 1251 12/25/24 0602 12/26/24 0520  WBC 13.6* 10.6* 8.9  HGB 14.6  12.4* 12.1*  HCT 45.4 40.0 38.8*  MCV 90.8 93.2 93.7  PLT 209 168 161   Basic Metabolic Panel: Recent Labs  Lab 12/24/24 1251 12/25/24 0602 12/26/24 0520  NA 136 138 141  K 5.4* 4.6 4.6  CL 102 105 108  CO2 22 21* 21*  GLUCOSE 126* 109* 86  BUN 19 18 16   CREATININE 1.19 1.32* 1.10  CALCIUM  10.0 8.9 9.2     Scheduled Meds:  bisacodyl   10 mg Rectal Daily   budesonide  (PULMICORT ) nebulizer solution  0.5 mg Nebulization BID   methocarbamol  (ROBAXIN ) injection  500 mg Intravenous Q8H    Imaging and lab data personally reviewed   Author: True Atlas  12/26/2024 1:40 PM  To contact [facility]>   Check the care team in [facility] and look for the attending/consulting [facility] provider listed  Log into [email or web address] and use [place]'s universal password   Go to> [facility]   and find provider  If you still have difficulty reaching the provider, please page the [facility] (Director on Call) for the Hospitalists listed on amion     "

## 2024-12-26 NOTE — Progress Notes (Signed)
" ° ° °  Patient Saturations on Room Air while Ambulating = 86%, slow recovery once seated, O2 Sat 89% RA, felt SOB, placed back on 2l O2 [place], MD made aware   "

## 2024-12-27 DIAGNOSIS — K4030 Unilateral inguinal hernia, with obstruction, without gangrene, not specified as recurrent: Secondary | ICD-10-CM | POA: Diagnosis not present

## 2024-12-27 MED ORDER — PROCHLORPERAZINE EDISYLATE 10 MG/2ML IJ SOLN: 10.0000 mg | Freq: Four times a day (QID) | INTRAMUSCULAR | Status: DC | PRN

## 2024-12-27 MED ORDER — BUDESON-GLYCOPYRROL-FORMOTEROL 160-9-4.8 MCG/ACT IN AERO
2.0000 | INHALATION_SPRAY | Freq: Two times a day (BID) | RESPIRATORY_TRACT | Status: DC
  Administered 2024-12-27 – 2024-12-31 (×8): 2 via RESPIRATORY_TRACT
  Filled 2024-12-27: qty 5.9

## 2024-12-27 NOTE — Progress Notes (Addendum)
 " [facility] Progress Note  Patient: Maurice Wood     FMW:[id number]  DOA: 12/24/2024   PCP: Maurice Alm, MD       Brief hospital course: Maurice Wood is a 89 y.o. male with medical history of A fib, COPD, nephrolithiasis, Chronic pain who is sent from the urology office for an inguinal hernia that was difficult to reduce. On 1/12 he shock wave lithotripsy was going to be performed on an 11 mm UPJ stone in left renal pelvis but he was noted to be hypoxic and the procedure was aborted.  He was then discharged home. He subsequently began to have vomiting and developed pain in right lower abdomen. He continued to have this pain and vomiting or dry heaving for days. Today he followed up with urology, underwent a CT scan, was found to have an incarcerated R inguinal hernia and was recommended to come to the ED.   Subjective:  Had a large BM earlier this AM. He's confused and states he vomited as well but son and RN confirm that he did not.  Feels a little nauseated after full liquids.   Assessment and Plan:  Principal Problem:   Incarcerated inguinal hernia  - 1/15- repair with Vicryl mesh  - 1/17 NG tube removed by patient  - 1/18 advanced to full liquids- naueated   Active Problems:     Hypoxia- h/o COPD and ILD- possible pneumonia -  87% on RA in ED- he has never required O2 in the past - has a h/o mild pulm fibrosis and COPD - has LLL infiltrate - receiving Zosyn  and Vanc in ED>  Cefepime  after surgery- plan for 5 days- stop date 1/19 - pulse ox 89%  on 1/15 - he became hypoxic to 80% on exertion on 1/17- O2 replaced - encouraged incentive spirometry and ambulation  Delrium - after anaesthesia- has resolved  AKI - Cr 1.19 > 1.32> 1.10    Left sided nephrolithiasis - asymptomatic - will need to f/u with urology     Atrial fibrillation, permanent  -  received KCentra  prior to surgery as he took Eliquis  the night before - not on rate controlling agent - rate still  controlled - hold anticoagulation until ok to resume per general surgery- recommended today to continue to hold the Eliquis      COPD (chronic obstructive pulmonary disease)   - currently no wheezing - PRN nebs and inhaler - uses trellagy as outpt   OSA - CPAP ordered   H/O CHF? Possibly diastolic - takes Lasix  once a week on Mondays          Code Status: Full Code Total time on patient care: 35 DVT prophylaxis:  Place and maintain sequential compression device Start: 12/25/24 1256SCDs    Objective:   Vitals:   12/27/24 0340 12/27/24 0549 12/27/24 0858 12/27/24 1347  BP:  117/79  120/70  Pulse:  88  78  Resp: 20 19  16   Temp:  98 F (36.7 C)  97.8 F (36.6 C)  TempSrc:  Oral  Oral  SpO2: 94% 97% 90% 98%  Weight:      Height:       Filed Weights   12/24/24 1229 12/24/24 1759  Weight: 68.9 kg 68.9 kg   Exam: General exam: Appears comfortable - sleepy HEENT: oral mucosa moist Respiratory system: Clear to auscultation.  Cardiovascular system: S1 & S2 heard  Gastrointestinal system: Abdomen soft, non-tender, nondistended. Active bowel sounds Extremities: No cyanosis, clubbing  or edema Psychiatry:  Mood & affect appropriate.      CBC: Recent Labs  Lab 12/24/24 1251 12/25/24 0602 12/26/24 0520  WBC 13.6* 10.6* 8.9  HGB 14.6 12.4* 12.1*  HCT 45.4 40.0 38.8*  MCV 90.8 93.2 93.7  PLT 209 168 161   Basic Metabolic Panel: Recent Labs  Lab 12/24/24 1251 12/25/24 0602 12/26/24 0520  NA 136 138 141  K 5.4* 4.6 4.6  CL 102 105 108  CO2 22 21* 21*  GLUCOSE 126* 109* 86  BUN 19 18 16   CREATININE 1.19 1.32* 1.10  CALCIUM  10.0 8.9 9.2     Scheduled Meds:  bisacodyl   10 mg Rectal Daily   budesonide -glycopyrrolate -formoterol   2 puff Inhalation BID   methocarbamol  (ROBAXIN ) injection  500 mg Intravenous Q8H    Imaging and lab data personally reviewed   Author: Marrissa Wood  12/27/2024 3:43 PM  To contact [facility]>   Check the care team in  [facility] and look for the attending/consulting [facility] provider listed  Log into [email or web address] and use [place]'s universal password   Go to> [facility]  and find provider  If you still have difficulty reaching the provider, please page the [facility] (Director on Call) for the Hospitalists listed on amion     "

## 2024-12-27 NOTE — Progress Notes (Signed)
 Mobility Specialist - Progress Note  ([place] 2L) Pre-mobility: 81 bpm HR, 97% SpO2 During mobility: 113 bpm HR, 92% SpO2 Post-mobility: 99 bpm HR, 95% SPO2   12/27/24 1557  Mobility  Activity Ambulated with assistance  Level of Assistance Moderate assist, patient does 50-74%  Assistive Device Front wheel walker  Distance Ambulated (ft) 80 ft  Range of Motion/Exercises Active  Activity Response Tolerated fair  Mobility visit 1 Mobility  Mobility Specialist Start Time (ACUTE ONLY) 1535  Mobility Specialist Stop Time (ACUTE ONLY) 1557  Mobility Specialist Time Calculation (min) (ACUTE ONLY) 22 min   Pt was found on recliner chair and agreeable to mobilize. C/o nausea and grew fatigued with session. At EOS returned to bed with all needs met. Call bell in reach and RN in room.   Erminio Leos,  Mobility Specialist Can be reached via Secure Chat

## 2024-12-27 NOTE — Progress Notes (Signed)
 Patient c/o nausea after eating full liquids. PRN Zofran  given at 1041 but patient continues to dry heave/spit- no active vomiting. Patient had 50% grits, 50% yogurt, and 2 OJs for breakfast. Pt also had a couple bites of pudding and a couple of sips of tea for lunch. MD Debby made aware of nausea- NPO ordered. Patient aware. Patient has passed gas/belched- no BM this shift. Last BM at 0300 today. Will continue to monitor closely.

## 2024-12-27 NOTE — Progress Notes (Signed)
 3 Days Post-Op R inguinal hernia repair with mesh, diagnostic laparoscopy  Subjective: Pt feeling better, had a BM last night, sat in the chair most of the day yesterday  Objective: Vital signs in last 24 hours: Temp:  [98 F (36.7 C)-98.5 F (36.9 C)] 98 F (36.7 C) (01/18 0549) Pulse Rate:  [81-91] 88 (01/18 0549) Resp:  [16-20] 19 (01/18 0549) BP: (114-119)/(76-79) 117/79 (01/18 0549) SpO2:  [88 %-100 %] 97 % (01/18 0549)   Intake/Output from previous day: 01/17 0701 - 01/18 0700 In: 2015 [P.O.:430; I.V.:1285; IV Piggyback:300] Out: 560 [Urine:560] Intake/Output this shift: No intake/output data recorded.   General appearance: alert and cooperative GI: soft, non-distended  Incision: mild erythema laterally, SSF draining  Lab Results:  Recent Labs    12/25/24 0602 12/26/24 0520  WBC 10.6* 8.9  HGB 12.4* 12.1*  HCT 40.0 38.8*  PLT 168 161   BMET Recent Labs    12/25/24 0602 12/26/24 0520  NA 138 141  K 4.6 4.6  CL 105 108  CO2 21* 21*  GLUCOSE 109* 86  BUN 18 16  CREATININE 1.32* 1.10  CALCIUM  8.9 9.2   PT/INR No results for input(s): LABPROT, INR in the last 72 hours. ABG No results for input(s): PHART, HCO3 in the last 72 hours.  Invalid input(s): PCO2, PO2  MEDS, Scheduled  bisacodyl   10 mg Rectal Daily   budesonide  (PULMICORT ) nebulizer solution  0.5 mg Nebulization BID   methocarbamol  (ROBAXIN ) injection  500 mg Intravenous Q8H    Studies/Results: No results found.  Assessment: s/p Procedures: REPAIR, HERNIA, INGUINAL, ADULT WITH MESH, DIAGNOSTIC LAPAROSCOPY Patient Active Problem List   Diagnosis Date Noted   Incarcerated inguinal hernia 12/24/2024   Elevated TSH 03/22/2024   Acute exacerbation of CHF (congestive heart failure) (HCC) 03/22/2024   Acute heart failure with preserved ejection fraction (HFpEF) (HCC) 03/21/2024   Acute respiratory failure with hypoxia (HCC) 03/21/2024   Prolonged QT interval 03/21/2024    Chorioretinitis 02/01/2021   Chronic pain 02/01/2021   Insomnia 02/01/2021   Interstitial lung disease (HCC) 02/01/2021   Nausea 02/01/2021   Overactive bladder 02/01/2021   Polyneuropathy 02/01/2021   Pulmonary nodule 02/01/2021   Seasonal allergic rhinitis 02/01/2021   Vitamin D  deficiency 02/01/2021   COPD (chronic obstructive pulmonary disease) (HCC) 08/08/2020   Body mass index (BMI) 28.0-28.9, adult 02/04/2020   Elevated blood-pressure reading, without diagnosis of hypertension 02/04/2020   Body mass index (BMI) 27.0-27.9, adult 01/18/2020   Bilateral impacted cerumen 12/21/2019   Pain due to onychomycosis of toenails of both feet 05/26/2019   Coagulation disorder 05/26/2019   Diabetic neuropathy (HCC) 05/26/2019   Pain in joint of right shoulder 08/13/2018   Left ear pain 06/05/2016   Sensorineural hearing loss (SNHL) of both ears 06/05/2016   Pseudophakia of both eyes 04/03/2016   PVD (posterior vitreous detachment), both eyes 04/03/2016   Bilateral lower extremity edema 06/02/2015   Lower urinary tract infectious disease 05/25/2015   Sepsis (HCC) 05/25/2015   Fever 05/25/2015   Cough 05/25/2015   Kidney stone 05/25/2015   Hypokalemia 05/25/2015   Acute urinary retention    H/O cardiac catheterizationn 12/2009 with normal coronary arteries 05/30/2014   Cerebral arterial aneurysm 04/20/2014   Low back pain 12/17/2013   Chest pain 09/10/2013   Branch retinal vein occlusion (HCC) 04/11/2012   Cystoid macular edema, left 04/11/2012   Epiretinal membrane, left 04/11/2012   Increased frequency of urination 10/08/2011   Urge incontinence 10/08/2011  Urinary incontinence 06/04/2011   Long term current use of anticoagulant 01/12/2011   HEMORRHOIDS-INTERNAL 06/27/2010   Sleep apnea 06/27/2010   DIVERTICULOSIS OF COLON 01/03/2010   GERD 11/22/2009   IRRITABLE BOWEL SYNDROME 11/22/2009   ERECTILE DYSFUNCTION 06/06/2009   BPH (benign prostatic hyperplasia) 06/18/2008    PSA, INCREASED 12/19/2007   OBSTRUCTIVE SLEEP APNEA 07/24/2007   DISSECTION, CAROTID ARTERY 07/24/2007   TESTOSTERONE DEFICIENCY 07/23/2007   HLD (hyperlipidemia) 07/23/2007   History of colonic polyps 07/23/2007   Atrial fibrillation, permanent (HCC) 07/14/2007    Post op ileus resolving   Plan: Advance diet   LOS: 3 days     .Bernarda JAYSON Ned, MD [facility] Surgery, [name]    12/27/2024 8:50 AM

## 2024-12-28 LAB — GLUCOSE, CAPILLARY: Glucose-Capillary: 106 mg/dL — ABNORMAL HIGH (ref 70–99)

## 2024-12-28 MED ORDER — SODIUM CHLORIDE 0.9 % IV SOLN
2.0000 g | Freq: Two times a day (BID) | INTRAVENOUS | Status: AC
  Administered 2024-12-28: 2 g via INTRAVENOUS
  Filled 2024-12-28: qty 12.5

## 2024-12-28 MED ORDER — ACETAMINOPHEN 500 MG PO TABS
1000.0000 mg | ORAL_TABLET | Freq: Four times a day (QID) | ORAL | Status: DC
  Administered 2024-12-28 – 2024-12-31 (×11): 1000 mg via ORAL
  Filled 2024-12-28 (×13): qty 2

## 2024-12-28 MED ORDER — SODIUM CHLORIDE 0.9% FLUSH
10.0000 mL | Freq: Two times a day (BID) | INTRAVENOUS | Status: DC
  Administered 2024-12-28 – 2024-12-30 (×3): 10 mL

## 2024-12-28 MED ORDER — ENOXAPARIN SODIUM 40 MG/0.4ML IJ SOSY
40.0000 mg | PREFILLED_SYRINGE | INTRAMUSCULAR | Status: DC
  Administered 2024-12-28 – 2024-12-29 (×2): 40 mg via SUBCUTANEOUS
  Filled 2024-12-28 (×2): qty 0.4

## 2024-12-28 MED ORDER — DOCUSATE SODIUM 100 MG PO CAPS
100.0000 mg | ORAL_CAPSULE | Freq: Two times a day (BID) | ORAL | Status: DC
  Administered 2024-12-28: 100 mg via ORAL
  Filled 2024-12-28 (×4): qty 1

## 2024-12-28 MED ORDER — ONDANSETRON 4 MG PO TBDP
4.0000 mg | ORAL_TABLET | Freq: Three times a day (TID) | ORAL | Status: DC | PRN
  Filled 2024-12-28: qty 1

## 2024-12-28 MED ORDER — BOOST / RESOURCE BREEZE PO LIQD CUSTOM
1.0000 | Freq: Three times a day (TID) | ORAL | Status: DC
  Administered 2024-12-28 – 2024-12-30 (×3): 1 via ORAL

## 2024-12-28 MED ORDER — SODIUM CHLORIDE 0.9% FLUSH: 10.0000 mL | INTRAVENOUS | Status: DC | PRN

## 2024-12-28 MED ORDER — HYDROMORPHONE HCL 1 MG/ML IJ SOLN: 0.5000 mg | INTRAMUSCULAR | Status: DC | PRN

## 2024-12-28 NOTE — Progress Notes (Signed)
 " [facility] Progress Note  Patient: Maurice Wood     FMW:[id number]  DOA: 12/24/2024   PCP: Seabron Alm, MD       Brief hospital course: Maurice Wood is a 89 y.o. male with medical history of A fib, COPD, nephrolithiasis, Chronic pain who is sent from the urology office for an inguinal hernia that was difficult to reduce. On 1/12 he shock wave lithotripsy was going to be performed on an 11 mm UPJ stone in left renal pelvis but he was noted to be hypoxic and the procedure was aborted.  He was then discharged home. He subsequently began to have vomiting and developed pain in right lower abdomen. He continued to have this pain and vomiting or dry heaving for days. Today he followed up with urology, underwent a CT scan, was found to have an incarcerated R inguinal hernia and was recommended to come to the ED.   Subjective:  Evaluated prior to breakfast. He had no complaints. He has begun to ambulate and feels well with this.   Assessment and Plan:  Principal Problem:   Incarcerated inguinal hernia  - 1/15- repair with Vicryl mesh  - 1/17 NG tube removed by patient  - 1/18 advanced to full liquids- nauseated- made NPO - 1/19- liquid diet- nauseated again   Active Problems:     Hypoxia- h/o COPD and ILD- possible pneumonia -  87% on RA in ED- he has never required O2 in the past - has a h/o mild pulm fibrosis and COPD - has LLL infiltrate - receiving Zosyn  and Vanc in ED>  Cefepime  after surgery- plan for 5 days- stop date today - pulse ox 89%  on 1/15 - he became hypoxic to 80% on exertion on 1/17- O2 replaced - encouraged incentive spirometry and ambulation- obtain another ambulatory pulse ox today  Delrium - after anaesthesia- has resolved  AKI - Cr 1.19 > 1.32> 1.10    Left sided nephrolithiasis - asymptomatic - will need to f/u with urology     Atrial fibrillation, permanent  -  received KCentra  prior to surgery as he took Eliquis  the night before - not on rate  controlling agent - rate still controlled - hold anticoagulation until ok to resume per general surgery- waiting to see if he tolerates diet - CHA2DS2-VASc Score 2     COPD (chronic obstructive pulmonary disease)   - currently no wheezing - PRN nebs and inhaler - uses trellagy as outpt   OSA - CPAP ordered   H/O CHF? Possibly diastolic - takes Lasix  once a week on Mondays          Code Status: Full Code Total time on patient care: 35 DVT prophylaxis:  Place and maintain sequential compression device Start: 12/25/24 1256SCDs    Objective:   Vitals:   12/28/24 0512 12/28/24 0853 12/28/24 0937 12/28/24 0940  BP: 110/74     Pulse:      Resp: 15     Temp: (!) 97.5 F (36.4 C)     TempSrc: Oral     SpO2: 96% 96% 96% 93%  Weight:      Height:       Filed Weights   12/24/24 1229 12/24/24 1759  Weight: 68.9 kg 68.9 kg   Exam: General exam: Appears comfortable - sleepy HEENT: oral mucosa moist Respiratory system:  crackles in RL base today- no cough Cardiovascular system: S1 & S2 heard  Gastrointestinal system: Abdomen soft, non-tender, nondistended. BS +  Extremities: No cyanosis, clubbing or edema Psychiatry:  Mood & affect appropriate.      CBC: Recent Labs  Lab 12/24/24 1251 12/25/24 0602 12/26/24 0520  WBC 13.6* 10.6* 8.9  HGB 14.6 12.4* 12.1*  HCT 45.4 40.0 38.8*  MCV 90.8 93.2 93.7  PLT 209 168 161   Basic Metabolic Panel: Recent Labs  Lab 12/24/24 1251 12/25/24 0602 12/26/24 0520  NA 136 138 141  K 5.4* 4.6 4.6  CL 102 105 108  CO2 22 21* 21*  GLUCOSE 126* 109* 86  BUN 19 18 16   CREATININE 1.19 1.32* 1.10  CALCIUM  10.0 8.9 9.2     Scheduled Meds:  acetaminophen   1,000 mg Oral Q6H   bisacodyl   10 mg Rectal Daily   budesonide -glycopyrrolate -formoterol   2 puff Inhalation BID   docusate sodium   100 mg Oral BID   feeding supplement  1 Container Oral TID BM    Imaging and lab data personally reviewed   Author: True Atlas   12/28/2024 12:12 PM  To contact [facility]>   Check the care team in [facility] and look for the attending/consulting [facility] provider listed  Log into [email or web address] and use [place]'s universal password   Go to> [facility]  and find provider  If you still have difficulty reaching the provider, please page the [facility] (Director on Call) for the Hospitalists listed on amion     "

## 2024-12-28 NOTE — Progress Notes (Signed)
" °   12/28/24 2324  BiPAP/CPAP/SIPAP  BiPAP/CPAP/SIPAP Pt Type Adult  Reason BIPAP/CPAP not in use Non-compliant    "

## 2024-12-28 NOTE — Progress Notes (Signed)
 Pt drank of apple juice and ate maybe 5% of his grits and then started c/o nausea, Dr. Earley notified and aware. Attempting to give IV nausea medications, but lost IV access- awaiting IV team.

## 2024-12-28 NOTE — Progress Notes (Signed)
SATURATION QUALIFICATIONS: (This note is used to comply with regulatory documentation for home oxygen)  Patient Saturations on Room Air at Rest = 90%  Patient Saturations on Room Air while Ambulating = 85%  Patient Saturations on 2 Liters of oxygen while Ambulating = 95%  Please briefly explain why patient needs home oxygen: 

## 2024-12-28 NOTE — Progress Notes (Addendum)
 4 Days Post-Op R inguinal hernia repair with mesh, diagnostic laparoscopy  Subjective: Pt feeling better, did some walking. Unknown flatus, BM apparently Saturday   Objective: Vital signs in last 24 hours: Temp:  [97.5 F (36.4 C)-97.8 F (36.6 C)] 97.5 F (36.4 C) (01/19 0512) Pulse Rate:  [78-83] 83 (01/18 2006) Resp:  [15-18] 15 (01/19 0512) BP: (110-120)/(70-74) 110/74 (01/19 0512) SpO2:  [93 %-98 %] 93 % (01/19 0940) FiO2 (%):  [28 %] 28 % (01/19 0853)   Intake/Output from previous day: 01/18 0701 - 01/19 0700 In: 260 [P.O.:260] Out: 620 [Urine:620] Intake/Output this shift: No intake/output data recorded.   General appearance: alert and cooperative GI: soft, non-distended  Incision: evolving ecchymosis and mild palpable hematoma vs seroma. Appropriately tender.   Lab Results:  Recent Labs    12/26/24 0520  WBC 8.9  HGB 12.1*  HCT 38.8*  PLT 161   BMET Recent Labs    12/26/24 0520  NA 141  K 4.6  CL 108  CO2 21*  GLUCOSE 86  BUN 16  CREATININE 1.10  CALCIUM  9.2   PT/INR No results for input(s): LABPROT, INR in the last 72 hours. ABG No results for input(s): PHART, HCO3 in the last 72 hours.  Invalid input(s): PCO2, PO2  MEDS, Scheduled  bisacodyl   10 mg Rectal Daily   budesonide -glycopyrrolate -formoterol   2 puff Inhalation BID   methocarbamol  (ROBAXIN ) injection  500 mg Intravenous Q8H    Studies/Results: No results found.  Assessment: s/p Procedures: REPAIR, HERNIA, INGUINAL, ADULT WITH MESH, DIAGNOSTIC LAPAROSCOPY Patient Active Problem List   Diagnosis Date Noted   Incarcerated inguinal hernia 12/24/2024   Elevated TSH 03/22/2024   Acute exacerbation of CHF (congestive heart failure) (HCC) 03/22/2024   Acute heart failure with preserved ejection fraction (HFpEF) (HCC) 03/21/2024   Acute respiratory failure with hypoxia (HCC) 03/21/2024   Prolonged QT interval 03/21/2024   Chorioretinitis 02/01/2021   Chronic pain  02/01/2021   Insomnia 02/01/2021   Interstitial lung disease (HCC) 02/01/2021   Nausea 02/01/2021   Overactive bladder 02/01/2021   Polyneuropathy 02/01/2021   Pulmonary nodule 02/01/2021   Seasonal allergic rhinitis 02/01/2021   Vitamin D  deficiency 02/01/2021   COPD (chronic obstructive pulmonary disease) (HCC) 08/08/2020   Body mass index (BMI) 28.0-28.9, adult 02/04/2020   Elevated blood-pressure reading, without diagnosis of hypertension 02/04/2020   Body mass index (BMI) 27.0-27.9, adult 01/18/2020   Bilateral impacted cerumen 12/21/2019   Pain due to onychomycosis of toenails of both feet 05/26/2019   Coagulation disorder 05/26/2019   Diabetic neuropathy (HCC) 05/26/2019   Pain in joint of right shoulder 08/13/2018   Left ear pain 06/05/2016   Sensorineural hearing loss (SNHL) of both ears 06/05/2016   Pseudophakia of both eyes 04/03/2016   PVD (posterior vitreous detachment), both eyes 04/03/2016   Bilateral lower extremity edema 06/02/2015   Lower urinary tract infectious disease 05/25/2015   Sepsis (HCC) 05/25/2015   Fever 05/25/2015   Cough 05/25/2015   Kidney stone 05/25/2015   Hypokalemia 05/25/2015   Acute urinary retention    H/O cardiac catheterizationn 12/2009 with normal coronary arteries 05/30/2014   Cerebral arterial aneurysm 04/20/2014   Low back pain 12/17/2013   Chest pain 09/10/2013   Branch retinal vein occlusion (HCC) 04/11/2012   Cystoid macular edema, left 04/11/2012   Epiretinal membrane, left 04/11/2012   Increased frequency of urination 10/08/2011   Urge incontinence 10/08/2011   Urinary incontinence 06/04/2011   Long term current use of anticoagulant 01/12/2011  HEMORRHOIDS-INTERNAL 06/27/2010   Sleep apnea 06/27/2010   DIVERTICULOSIS OF COLON 01/03/2010   GERD 11/22/2009   IRRITABLE BOWEL SYNDROME 11/22/2009   ERECTILE DYSFUNCTION 06/06/2009   BPH (benign prostatic hyperplasia) 06/18/2008   PSA, INCREASED 12/19/2007   OBSTRUCTIVE SLEEP  APNEA 07/24/2007   DISSECTION, CAROTID ARTERY 07/24/2007   TESTOSTERONE DEFICIENCY 07/23/2007   HLD (hyperlipidemia) 07/23/2007   History of colonic polyps 07/23/2007   Atrial fibrillation, permanent (HCC) 07/14/2007    Post op ileus resolving   Plan: -Diet as tolerated -Mobilize -Monitor incision, check WBC if erythema returns -OK for DC when tolerating PO and has additional bowel movements.    LOS: 4 days     .Bernarda JAYSON Ned, MD [facility] Surgery, [name]    12/28/2024 10:08 AM

## 2024-12-28 NOTE — Plan of Care (Signed)

## 2024-12-29 LAB — CBC
HCT: 36.4 % — ABNORMAL LOW (ref 39.0–52.0)
Hemoglobin: 11.7 g/dL — ABNORMAL LOW (ref 13.0–17.0)
MCH: 29.1 pg (ref 26.0–34.0)
MCHC: 32.1 g/dL (ref 30.0–36.0)
MCV: 90.5 fL (ref 80.0–100.0)
Platelets: 170 K/uL (ref 150–400)
RBC: 4.02 MIL/uL — ABNORMAL LOW (ref 4.22–5.81)
RDW: 13.7 % (ref 11.5–15.5)
WBC: 6.7 K/uL (ref 4.0–10.5)
nRBC: 0 % (ref 0.0–0.2)

## 2024-12-29 LAB — BASIC METABOLIC PANEL WITH GFR
Anion gap: 8 (ref 5–15)
BUN: 9 mg/dL (ref 8–23)
CO2: 21 mmol/L — ABNORMAL LOW (ref 22–32)
Calcium: 9 mg/dL (ref 8.9–10.3)
Chloride: 109 mmol/L (ref 98–111)
Creatinine, Ser: 0.72 mg/dL (ref 0.61–1.24)
GFR, Estimated: >60 mL/min
Glucose, Bld: 130 mg/dL — ABNORMAL HIGH (ref 70–99)
Potassium: 3.8 mmol/L (ref 3.5–5.1)
Sodium: 138 mmol/L (ref 135–145)

## 2024-12-29 LAB — MAGNESIUM: Magnesium: 2.1 mg/dL (ref 1.7–2.4)

## 2024-12-29 MED ORDER — APIXABAN 5 MG PO TABS: 5.0000 mg | ORAL_TABLET | Freq: Two times a day (BID) | ORAL | Status: DC

## 2024-12-29 MED ORDER — APIXABAN 5 MG PO TABS
5.0000 mg | ORAL_TABLET | Freq: Two times a day (BID) | ORAL | Status: DC
  Administered 2024-12-30 – 2024-12-31 (×3): 5 mg via ORAL
  Filled 2024-12-29 (×3): qty 1

## 2024-12-29 NOTE — Progress Notes (Signed)
 Mobility Specialist - Progress Note:   12/29/24 1626  Oxygen  Therapy  SpO2 92 %  O2 Device Nasal Cannula  O2 Flow Rate (L/min) 2 L/min  Mobility  Activity Ambulated with assistance  Level of Assistance Minimal assist, patient does 75% or more  Assistive Device Front wheel walker  Distance Ambulated (ft) 25 ft  Activity Response Tolerated fair  Mobility Referral Yes  Mobility visit 1 Mobility  Mobility Specialist Start Time (ACUTE ONLY) 1559  Mobility Specialist Stop Time (ACUTE ONLY) 1615  Mobility Specialist Time Calculation (min) (ACUTE ONLY) 16 min   Pt was received in recliner and agreed to mobility. Min A sit to stand. Limited ambulation due to Pt stating feeling very weak. Returned to bed with all needs met. Call bell in reach. Left in room with family.  [employer] - Mobility Specialist - Acute Rehabilitation Can be reached via [employer]

## 2024-12-29 NOTE — Discharge Instructions (Addendum)
 HERNIA REPAIR: POST OP INSTRUCTIONS   DIET: Follow your usual diet  Take your usually prescribed home medications unless otherwise directed.  PAIN CONTROL: Pain is best controlled by a usual combination of three different methods TOGETHER: Ice/Heat Over the counter pain medication Prescription pain medication Most patients will experience some swelling and bruising around the hernia(s) such as the bellybutton, groins, or old incisions.  Ice packs or heating pads (30-60 minutes up to 6 times a day) will help. Use ice for the first few days to help decrease swelling and bruising, then switch to heat to help relax tight/sore spots and speed recovery.  Some people prefer to use ice alone, heat alone, alternating between ice & heat.  Experiment to what works for you.  Swelling and bruising can take several weeks to resolve.   It is helpful to take an over-the-counter pain medication regularly for the first days: Naproxen (Aleve, etc)  Two 220mg  tabs twice a day OR Ibuprofen (Advil, etc) Three 200mg  tabs four times a day (every meal & bedtime) AND Acetaminophen  (Tylenol , etc) 325-650mg  four times a day (every meal & bedtime)  If you are having problems/concerns with the pain control (does not control pain, nausea, vomiting, rash, itching, etc), please call us  [phone] to see if we need to switch you to a different pain medicine that will work better for you and/or control your side effect better. If you need a refill on your pain medication, please contact your pharmacy.  They will contact our office to request authorization. Prescriptions will not be filled after 5 pm or on week-ends.  Avoid getting constipated.  Between the surgery and the pain medications, it is common to experience some constipation.  Increasing fluid intake and taking a fiber supplement (such as Metamucil, Citrucel, FiberCon, MiraLax, etc) 1-2 times a day regularly will usually help prevent this problem from occurring.  A  mild laxative (prune juice, Milk of Magnesia, MiraLax, etc) should be taken according to package directions if there are no bowel movements after 48 hours.    Wash / shower every day, starting 2 days after surgery.  You may shower. Let the soap and water run over the incision and pat dry.  No rubbing, scrubbing, lotions or ointments to incision(s). Do not soak or submerge incision.  You may leave the incision open to air.  You may replace a dressing/Band-Aid to cover an incision for comfort if you wish.  Continue to shower over incision(s) after the dressing is off.  ACTIVITIES as tolerated:   You may resume regular (light) daily activities beginning the next day--such as daily self-care, walking, climbing stairs--gradually increasing activities as tolerated.  Control your pain so that you can walk an hour a day.  If you can walk 30 minutes without difficulty, it is safe to try more intense activity such as jogging, treadmill, bicycling, low-impact aerobics, swimming, etc. Refrain from the most intensive and strenuous activity such as sit-ups, heavy lifting, contact sports, etc  Refrain from any heavy lifting or straining until 6 weeks after surgery.   DO NOT PUSH THROUGH PAIN.  Let pain be your guide: If it hurts to do something, don't do it.  Pain is your body warning you to avoid that activity for another week until the pain goes down. You may drive when you are no longer taking prescription pain medication, you can comfortably wear a seatbelt, and you can safely maneuver your car and apply brakes. You may have sexual intercourse when  it is comfortable.   FOLLOW UP in our office Please call [facility] at [phone] to set up an appointment to see your surgeon in the office for a follow-up appointment approximately 2-3 weeks after your surgery. Make sure that you call for this appointment the day you arrive home to insure a convenient appointment time.  9.  If you have disability of FMLA / Family  leave forms, please bring the forms to the office for processing.  (do not give to your surgeon).  WHEN TO CALL US  [phone]: Poor pain control Reactions / problems with new medications (rash/itching, nausea, etc)  Fever over 101.5 F (38.5 C) Inability to urinate Nausea and/or vomiting Worsening swelling or bruising Continued bleeding from incision. Increased pain, redness, or drainage from the incision   The clinic staff is available to answer your questions during regular business hours (8:30am-5pm).  Please dont hesitate to call and ask to speak to one of our nurses for clinical concerns.   If you have a medical emergency, go to the nearest emergency room or call 911.  A surgeon from [facility] is always on call at the hospitals in [facility], [name] [address], [place], [place]  [address] ?  [address] [address], [place], [place]   [address] MAIN: [phone] ? TOLL FREE: [phone] ? FAX: [phone] [email or web address]

## 2024-12-29 NOTE — Plan of Care (Signed)
" °  Problem: Elimination: Goal: Will not experience complications related to bowel motility Outcome: Progressing Goal: Will not experience complications related to urinary retention Outcome: Not Progressing   Problem: Urinary Elimination: Goal: Will remain free from infection Outcome: Not Progressing Goal: Ability to achieve and maintain adequate urine output Outcome: Not Progressing   Problem: Nutritional: Goal: Will attain and maintain optimal nutritional status Outcome: Progressing   "

## 2024-12-29 NOTE — TOC Initial Note (Signed)
 Transition of Care [facility]) - Initial/Assessment Note    Patient Details  Name: Maurice Wood MRN: [id number] Date of Birth: December 06, 1933  Transition of Care [facility]) CM/SW Contact:    Bascom Service, RN Phone Number: 12/29/2024, 4:11 PM  Clinical Narrative: spoke to spouse Kathrine about d/c plans-HHPT/OT;home 02-no preference-await choice;dme-[employer] d/t insurance-await delivery 02 travel tank to [place] prior d/c. Has own transport home.                  Expected Discharge Plan: Home w Home Health Services Barriers to Discharge: Continued Medical Work up   Patient Goals and CMS Choice Patient states their goals for this hospitalization and ongoing recovery are:: Home CMS Medicare.gov Compare Post Acute Care list provided to:: Patient Choice offered to / list presented to : Patient, Spouse      Expected Discharge Plan and Services   Discharge Planning Services: CM Consult Post Acute Care Choice: Home Health Living arrangements for the past 2 months: Single Family Home                 DME Arranged: Oxygen  DME Agency: [facility] Date DME Agency Contacted: 12/29/24 Time DME Agency Contacted: [phone] Representative spoke with at DME Agency: [facility] HH Arranged: PT, OT          Prior Living Arrangements/Services Living arrangements for the past 2 months: Single Family Home Lives with:: Spouse   Do you feel safe going back to the place where you live?: Yes          Current home services: DME (rw)    Activities of Daily Living   ADL Screening (condition at time of admission) Independently performs ADLs?: Yes (appropriate for developmental age) Is the patient deaf or have difficulty hearing?: No Does the patient have difficulty seeing, even when wearing glasses/contacts?: No Does the patient have difficulty concentrating, remembering, or making decisions?: No  Permission Sought/Granted                  Emotional Assessment              Admission diagnosis:   Incarcerated inguinal hernia [K40.30] Patient Active Problem List   Diagnosis Date Noted   Incarcerated inguinal hernia 12/24/2024   Elevated TSH 03/22/2024   Acute exacerbation of CHF (congestive heart failure) (HCC) 03/22/2024   Acute heart failure with preserved ejection fraction (HFpEF) (HCC) 03/21/2024   Acute respiratory failure with hypoxia (HCC) 03/21/2024   Prolonged QT interval 03/21/2024   Chorioretinitis 02/01/2021   Chronic pain 02/01/2021   Insomnia 02/01/2021   Interstitial lung disease (HCC) 02/01/2021   Nausea 02/01/2021   Overactive bladder 02/01/2021   Polyneuropathy 02/01/2021   Pulmonary nodule 02/01/2021   Seasonal allergic rhinitis 02/01/2021   Vitamin D  deficiency 02/01/2021   COPD (chronic obstructive pulmonary disease) (HCC) 08/08/2020   Body mass index (BMI) 28.0-28.9, adult 02/04/2020   Elevated blood-pressure reading, without diagnosis of hypertension 02/04/2020   Body mass index (BMI) 27.0-27.9, adult 01/18/2020   Bilateral impacted cerumen 12/21/2019   Pain due to onychomycosis of toenails of both feet 05/26/2019   Coagulation disorder 05/26/2019   Diabetic neuropathy (HCC) 05/26/2019   Pain in joint of right shoulder 08/13/2018   Left ear pain 06/05/2016   Sensorineural hearing loss (SNHL) of both ears 06/05/2016   Pseudophakia of both eyes 04/03/2016   PVD (posterior vitreous detachment), both eyes 04/03/2016   Bilateral lower extremity edema 06/02/2015   Lower urinary tract infectious disease 05/25/2015   Sepsis (  HCC) 05/25/2015   Fever 05/25/2015   Cough 05/25/2015   Kidney stone 05/25/2015   Hypokalemia 05/25/2015   Acute urinary retention    H/O cardiac catheterizationn 12/2009 with normal coronary arteries 05/30/2014   Cerebral arterial aneurysm 04/20/2014   Low back pain 12/17/2013   Chest pain 09/10/2013   Branch retinal vein occlusion (HCC) 04/11/2012   Cystoid macular edema, left 04/11/2012   Epiretinal membrane, left 04/11/2012    Increased frequency of urination 10/08/2011   Urge incontinence 10/08/2011   Urinary incontinence 06/04/2011   Long term current use of anticoagulant 01/12/2011   HEMORRHOIDS-INTERNAL 06/27/2010   Sleep apnea 06/27/2010   DIVERTICULOSIS OF COLON 01/03/2010   GERD 11/22/2009   IRRITABLE BOWEL SYNDROME 11/22/2009   ERECTILE DYSFUNCTION 06/06/2009   BPH (benign prostatic hyperplasia) 06/18/2008   PSA, INCREASED 12/19/2007   OBSTRUCTIVE SLEEP APNEA 07/24/2007   DISSECTION, CAROTID ARTERY 07/24/2007   TESTOSTERONE DEFICIENCY 07/23/2007   HLD (hyperlipidemia) 07/23/2007   History of colonic polyps 07/23/2007   Atrial fibrillation, permanent (HCC) 07/14/2007   PCP:  Seabron Lenis, MD Pharmacy:   [id number] [place], [place] - [address] [place] [address] Phone: [phone] Fax: [phone]     Social Drivers of Health (SDOH) Social History: SDOH Screenings   Food Insecurity: No Food Insecurity (12/24/2024)  Housing: Low Risk (12/24/2024)  Transportation Needs: No Transportation Needs (12/24/2024)  Utilities: Not At Risk (12/24/2024)  Social Connections: Moderately Isolated (12/24/2024)  Tobacco Use: Medium Risk (12/24/2024)   SDOH Interventions:     Readmission Risk Interventions    03/23/2024    9:40 AM  Readmission Risk Prevention Plan  Transportation Screening Complete  PCP or Specialist Appt within 5-7 Days Complete  Home Care Screening Complete  Medication Review (RN CM) Complete

## 2024-12-29 NOTE — Progress Notes (Signed)
 Pt called for assistance with reaching the urinal so he could void. I went in and assisted with holding the urinal- pt was unable to void despite feeling the urge to. At this time I noticed that his lower abdominal incision area had become more swollen than previously looked this morning. Staples to incision still intact but there is some oozing at incision site due to the swelling. Attempted to bladder scan at this time but was unable to get an accurate assessment due to swelling. Mobility specialist came by at this time asking if pt could walk. I encouraged pt to walk hoping this may help him to void. Dr. Earley and Dr. Signe from general surgery notified. After walking I had the charge nurse attempt a bladder scan as well- again without success. MD is aware, no new orders at this time. Will apply ice to the area and try to elevate scrotum.

## 2024-12-29 NOTE — Progress Notes (Signed)
 Physical Therapy Treatment Patient Details Name: Maurice Wood MRN: [id number] DOB: 08-26-1933 Today's Date: 12/29/2024   History of Present Illness 89 y.o. male who is sent from the urology office for an inguinal hernia that was difficult to reduce.  Pt s/p open RIH with vicryl mesh and dx lap, Dr. Ebbie 1/15. EFY:jumpjo fibrillation,OSA, HTN, small intracranial aneurysms with spontaneous dissection of the right internal carotid artery in 2002, COPD, ILD, HLD, GERD, BPH, Chronic pain    PT Comments  PT - Cognition Comments: AxO x 3 pleasant and motivated.  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 Pt in bed on 2 lts sats 98%.  Trial removed nasal cannula sats decreased to 95%.  Assisted OOB to amb while monitoring sats.  SATURATION QUALIFICATIONS: (This note is used to comply with regulatory documentation for home oxygen )   Patient Saturations on Room Air at Rest = 95%   Patient Saturations on Room Air while Ambulating 25 feet= 88%   Patient Saturations on 1 Liters of oxygen  while Ambulating = 90%   Please briefly explain why patient needs home oxygen :  Pt required supplemental oxygen  during activty  General Gait Details: assisted with amb in hallway + 2 assist such that recliner was following.  VC's on proper walker to self distance and upright posture.  Pt did not use a walker prior.  Distance limited by MAX c/o weakness/fatigue.  Several days of nausea and poor po intake per EPIC.  Pt amb 20 feet with a seated rest break, then another 25 feet. Pt plans to return to home with Family support.     If plan is discharge home, recommend the following: Assistance with cooking/housework;Assist for transportation;Help with stairs or ramp for entrance   Can travel by private vehicle        Equipment Recommendations  None recommended by PT    Recommendations for Other Services       Precautions / Restrictions Precautions Precautions: Fall Precaution/Restrictions Comments:  monitor sats Restrictions Weight Bearing Restrictions Per Provider Order: No     Mobility  Bed Mobility Overal bed mobility: Needs Assistance Bed Mobility: Supine to Sit     Supine to sit: Supervision     General bed mobility comments: increased time, self able but needed to use rail and HOB was elevated.    Transfers Overall transfer level: Needs assistance Equipment used: Rolling walker (2 wheels) Transfers: Sit to/from Stand Sit to Stand: Contact guard assist, Min assist           General transfer comment: verbal cues for hand placement to push from bed vs pull on walker.  Bed elevated.  VC's for hand placement to reach back prior to sit to control decend.    Ambulation/Gait Ambulation/Gait assistance: Contact guard assist Gait Distance (Feet): 55 Feet (20 = 25 feet) Assistive device: Rolling walker (2 wheels) Gait Pattern/deviations: Step-through pattern, Decreased stride length Gait velocity: decreased     General Gait Details: assisted with amb in hallway + 2 assist such that recliner was following.  VC's on proper walker to self distance and upright posture.  Pt did not use a walker prior.  Distance limited by MAX c/o weakness/fatigue.  Several days of nausea and poor po intake per EPIC.  Pt amb 20 feet with a seated rest break, then another 25 feet.   Stairs             Wheelchair Mobility     Tilt Bed    Modified Rankin (Stroke  Patients Only)       Balance                                            Communication Communication Communication: No apparent difficulties  Cognition Arousal: Alert Behavior During Therapy: WFL for tasks assessed/performed   PT - Cognitive impairments: No apparent impairments                       PT - Cognition Comments: AxO x 3 pleasant and motivated.  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 Following commands: Intact      Cueing Cueing Techniques: Verbal cues  Exercises       General Comments        Pertinent Vitals/Pain Pain Assessment Pain Assessment: No/denies pain    Home Living                          Prior Function            PT Goals (current goals can now be found in the care plan section) Progress towards PT goals: Progressing toward goals    Frequency    Min 2X/week      PT Plan      Co-evaluation              AM-PAC PT 6 Clicks Mobility   Outcome Measure  Help needed turning from your back to your side while in a flat bed without using bedrails?: A Little Help needed moving from lying on your back to sitting on the side of a flat bed without using bedrails?: A Little Help needed moving to and from a bed to a chair (including a wheelchair)?: A Little Help needed standing up from a chair using your arms (e.g., wheelchair or bedside chair)?: A Little Help needed to walk in hospital room?: A Little Help needed climbing 3-5 steps with a railing? : A Lot 6 Click Score: 17    End of Session Equipment Utilized During Treatment: Gait belt Activity Tolerance: Patient tolerated treatment well Patient left: with call bell/phone within reach;in chair;with family/visitor present;with nursing/sitter in room Nurse Communication: Mobility status PT Visit Diagnosis: Difficulty in walking, not elsewhere classified (R26.2)     Time: 9067-8984 PT Time Calculation (min) (ACUTE ONLY): 43 min  Charges:    $Gait Training: 23-37 mins $Therapeutic Activity: 8-22 mins PT General Charges $$ ACUTE PT VISIT: 1 Visit                     Katheryn Leap  PTA Acute  Rehabilitation Services Office M-F          [phone]

## 2024-12-29 NOTE — Progress Notes (Signed)
 Attempted in and out cath, unsuccessful due to edema. Scrotum elevated and ice placed on lower abdomen to try to reduce swelling.

## 2024-12-29 NOTE — Progress Notes (Addendum)
 5 Days Post-Op R inguinal hernia repair with mesh, diagnostic laparoscopy  Subjective: He reports feeling well and has no complaints this morning.  He is tolerating a diet without any nausea and he reports has had another bowel movement this morning.  Walking in the halls.  Objective: Vital signs in last 24 hours: Temp:  [97.4 F (36.3 C)-97.7 F (36.5 C)] 97.7 F (36.5 C) (01/20 1212) Pulse Rate:  [56-81] 56 (01/20 1212) Resp:  [17] 17 (01/20 1212) BP: (95-110)/(58-80) 101/63 (01/20 1212) SpO2:  [95 %-98 %] 98 % (01/20 1212)   Intake/Output from previous day: 01/19 0701 - 01/20 0700 In: 220 [P.O.:220] Out: 600 [Urine:600] Intake/Output this shift: Total I/O In: -  Out: 200 [Urine:200]   General appearance: alert and cooperative GI: soft, non-distended  Incision: evolving ecchymosis and mild palpable hematoma vs seroma. Appropriately tender.   Lab Results:  Recent Labs    12/29/24 0038  WBC 6.7  HGB 11.7*  HCT 36.4*  PLT 170   BMET Recent Labs    12/29/24 0038  NA 138  K 3.8  CL 109  CO2 21*  GLUCOSE 130*  BUN 9  CREATININE 0.72  CALCIUM  9.0   PT/INR No results for input(s): LABPROT, INR in the last 72 hours. ABG No results for input(s): PHART, HCO3 in the last 72 hours.  Invalid input(s): PCO2, PO2  MEDS, Scheduled  acetaminophen   1,000 mg Oral Q6H   bisacodyl   10 mg Rectal Daily   budesonide -glycopyrrolate -formoterol   2 puff Inhalation BID   docusate sodium   100 mg Oral BID   enoxaparin  (LOVENOX ) injection  40 mg Subcutaneous Q24H   feeding supplement  1 Container Oral TID BM   sodium chloride  flush  10-40 mL Intracatheter Q12H    Studies/Results: No results found.  Assessment: s/p Procedures: REPAIR, HERNIA, INGUINAL, ADULT WITH MESH, DIAGNOSTIC LAPAROSCOPY Patient Active Problem List   Diagnosis Date Noted   Incarcerated inguinal hernia 12/24/2024   Elevated TSH 03/22/2024   Acute exacerbation of CHF (congestive heart  failure) (HCC) 03/22/2024   Acute heart failure with preserved ejection fraction (HFpEF) (HCC) 03/21/2024   Acute respiratory failure with hypoxia (HCC) 03/21/2024   Prolonged QT interval 03/21/2024   Chorioretinitis 02/01/2021   Chronic pain 02/01/2021   Insomnia 02/01/2021   Interstitial lung disease (HCC) 02/01/2021   Nausea 02/01/2021   Overactive bladder 02/01/2021   Polyneuropathy 02/01/2021   Pulmonary nodule 02/01/2021   Seasonal allergic rhinitis 02/01/2021   Vitamin D  deficiency 02/01/2021   COPD (chronic obstructive pulmonary disease) (HCC) 08/08/2020   Body mass index (BMI) 28.0-28.9, adult 02/04/2020   Elevated blood-pressure reading, without diagnosis of hypertension 02/04/2020   Body mass index (BMI) 27.0-27.9, adult 01/18/2020   Bilateral impacted cerumen 12/21/2019   Pain due to onychomycosis of toenails of both feet 05/26/2019   Coagulation disorder 05/26/2019   Diabetic neuropathy (HCC) 05/26/2019   Pain in joint of right shoulder 08/13/2018   Left ear pain 06/05/2016   Sensorineural hearing loss (SNHL) of both ears 06/05/2016   Pseudophakia of both eyes 04/03/2016   PVD (posterior vitreous detachment), both eyes 04/03/2016   Bilateral lower extremity edema 06/02/2015   Lower urinary tract infectious disease 05/25/2015   Sepsis (HCC) 05/25/2015   Fever 05/25/2015   Cough 05/25/2015   Kidney stone 05/25/2015   Hypokalemia 05/25/2015   Acute urinary retention    H/O cardiac catheterizationn 12/2009 with normal coronary arteries 05/30/2014   Cerebral arterial aneurysm 04/20/2014   Low back  pain 12/17/2013   Chest pain 09/10/2013   Branch retinal vein occlusion (HCC) 04/11/2012   Cystoid macular edema, left 04/11/2012   Epiretinal membrane, left 04/11/2012   Increased frequency of urination 10/08/2011   Urge incontinence 10/08/2011   Urinary incontinence 06/04/2011   Long term current use of anticoagulant 01/12/2011   HEMORRHOIDS-INTERNAL 06/27/2010    Sleep apnea 06/27/2010   DIVERTICULOSIS OF COLON 01/03/2010   GERD 11/22/2009   IRRITABLE BOWEL SYNDROME 11/22/2009   ERECTILE DYSFUNCTION 06/06/2009   BPH (benign prostatic hyperplasia) 06/18/2008   PSA, INCREASED 12/19/2007   OBSTRUCTIVE SLEEP APNEA 07/24/2007   DISSECTION, CAROTID ARTERY 07/24/2007   TESTOSTERONE DEFICIENCY 07/23/2007   HLD (hyperlipidemia) 07/23/2007   History of colonic polyps 07/23/2007   Atrial fibrillation, permanent (HCC) 07/14/2007    Post op ileus resolved  Plan: -Diet as tolerated -Mobilize  - Okay for discharge from surgery standpoint once cleared by medical team. No Rx needed as he has not required pain meds in several days.    LOS: 5 days     .Bernarda JAYSON Ned, MD [facility], [name]    12/29/2024 12:22 PM

## 2024-12-29 NOTE — Progress Notes (Signed)
" °   12/29/24 1955  BiPAP/CPAP/SIPAP  BiPAP/CPAP/SIPAP Pt Type Adult  Reason BIPAP/CPAP not in use Non-compliant (pt refusing cpap for the night)    "

## 2024-12-29 NOTE — TOC CM/SW Note (Signed)
" °  °  Durable Medical Equipment  (From admission, onward)           Start     Ordered   12/29/24 1425  For home use only DME oxygen   Once       Question Answer Comment  Length of Need 6 Months   Mode or (Route) Nasal cannula   Liters per Minute 2   Frequency Continuous (stationary and portable oxygen  unit needed)   Oxygen  conserving device Yes   Oxygen  delivery system: Gas   Oxygen  delivery system: Portable concentrator (POC)      12/29/24 1424            "

## 2024-12-29 NOTE — Progress Notes (Signed)
 PHYSICAL THERAPY   SATURATION QUALIFICATIONS: (This note is used to comply with regulatory documentation for home oxygen )  Patient Saturations on Room Air at Rest = 95%  Patient Saturations on Room Air while Ambulating 25 feet= 88%  Patient Saturations on 1 Liters of oxygen  while Ambulating = 90%  Please briefly explain why patient needs home oxygen :  Pt required supplemental oxygen  during activty  Katheryn Leap  PTA Acute  Rehabilitation Services Office M-F          [phone]

## 2024-12-29 NOTE — Progress Notes (Addendum)
 " [facility] Progress Note  Patient: Maurice Wood     FMW:[id number]  DOA: 12/24/2024   PCP: Seabron Alm, MD       Brief hospital course: Maurice Wood is a 89 y.o. male with medical history of A fib, COPD, nephrolithiasis, Chronic pain who is sent from the urology office for an inguinal hernia that was difficult to reduce. On 1/12 he shock wave lithotripsy was going to be performed on an 11 mm UPJ stone in left renal pelvis but he was noted to be hypoxic and the procedure was aborted.  He was then discharged home. He subsequently began to have vomiting and developed pain in right lower abdomen. He continued to have this pain and vomiting or dry heaving for days. Today he followed up with urology, underwent a CT scan, was found to have an incarcerated R inguinal hernia and was recommended to come to the ED.   Subjective:  Tolerating diet. Ambulated once yesterday.   Assessment and Plan:  Principal Problem:   Incarcerated inguinal hernia  - 1/15- repair with Vicryl mesh  - 1/17 NG tube removed by patient  - 1/18 advanced to full liquids- nauseated- made NPO - 1/19- liquid diet- nauseated again - today tolerated solids   Active Problems:     Hypoxia- h/o COPD and ILD- possible pneumonia -  87% on RA in ED- he has never required O2 in the past - has a h/o mild pulm fibrosis and COPD - has LLL infiltrate on CT - receiving Zosyn  and Vanc in ED>  Cefepime  after surgery- plan for 5 days- stopped 1/19 - pulse ox 89%  on 1/15 - he became hypoxic to 80% on exertion on 1/17- O2 replaced - encouraged incentive spirometry and ambulation- - pulse ox still in 88% on RA- home O2 ordered - persistent RLL crackles may be atelectasis  - recheck CXR in AM  Difficulty voiding - he is voiding but also feeling the urge to void and at times unable to void - oral intake has been poor so may not have much urine in bladder - I and O cath unsuccessful due to edema in scrotum- elevated and ice  pack placed - of note, he was recommended to stop Flomax  and was no longer taking it - cont to follow   Delrium - after anaesthesia- has resolved  AKI - Cr 1.19 > 1.32> 1.10> 0.72    Left sided nephrolithiasis - asymptomatic - will need to f/u with urology     Atrial fibrillation, permanent  -  received KCentra  prior to surgery as he took Eliquis  the night before - not on rate controlling agent - rate still controlled - hold anticoagulation until ok to resume per general surgery-- ordered to resume tomorrow AM - CHA2DS2-VASc Score 2     COPD (chronic obstructive pulmonary disease)   - currently no wheezing - PRN nebs and inhaler - uses trellagy as outpt   OSA - CPAP ordered   H/O CHF? Possibly diastolic - takes Lasix  once a week on Mondays- did not give this week          Code Status: Full Code Total time on patient care: 35 DVT prophylaxis:  enoxaparin  (LOVENOX ) injection 40 mg Start: 12/28/24 1400 Place and maintain sequential compression device Start: 12/25/24 1256SCDs    Objective:   Vitals:   12/29/24 0419 12/29/24 0825 12/29/24 1212 12/29/24 1626  BP: (!) 95/58  101/63   Pulse: 77  (!) 56  Resp:   17   Temp: (!) 97.5 F (36.4 C)  97.7 F (36.5 C)   TempSrc: Oral  Oral   SpO2: 95% 95% 98% 92%  Weight:      Height:       Filed Weights   12/24/24 1229 12/24/24 1759  Weight: 68.9 kg 68.9 kg   Exam: General exam: Appears comfortable   HEENT: oral mucosa moist Respiratory system:  still has crackles in RL base today- no cough Cardiovascular system: S1 & S2 heard  Gastrointestinal system: Abdomen soft, non-tender, nondistended. BS + Extremities: No cyanosis, clubbing or edema Psychiatry:  Mood & affect appropriate.      CBC: Recent Labs  Lab 12/24/24 1251 12/25/24 0602 12/26/24 0520 12/29/24 0038  WBC 13.6* 10.6* 8.9 6.7  HGB 14.6 12.4* 12.1* 11.7*  HCT 45.4 40.0 38.8* 36.4*  MCV 90.8 93.2 93.7 90.5  PLT 209 168 161 170   Basic  Metabolic Panel: Recent Labs  Lab 12/24/24 1251 12/25/24 0602 12/26/24 0520 12/29/24 0038  NA 136 138 141 138  K 5.4* 4.6 4.6 3.8  CL 102 105 108 109  CO2 22 21* 21* 21*  GLUCOSE 126* 109* 86 130*  BUN 19 18 16 9   CREATININE 1.19 1.32* 1.10 0.72  CALCIUM  10.0 8.9 9.2 9.0  MG  --   --   --  2.1     Scheduled Meds:  acetaminophen   1,000 mg Oral Q6H   bisacodyl   10 mg Rectal Daily   budesonide -glycopyrrolate -formoterol   2 puff Inhalation BID   docusate sodium   100 mg Oral BID   enoxaparin  (LOVENOX ) injection  40 mg Subcutaneous Q24H   feeding supplement  1 Container Oral TID BM   sodium chloride  flush  10-40 mL Intracatheter Q12H    Imaging and lab data personally reviewed   Author: [employer]  12/29/2024 6:05 PM  To contact [facility]>   Check the care team in [facility] and look for the attending/consulting [facility] provider listed  Log into [email or web address] and use [place]'s universal password   Go to> [facility]  and find provider  If you still have difficulty reaching the provider, please page the [facility] (Director on Call) for the Hospitalists listed on amion     "

## 2024-12-30 ENCOUNTER — Inpatient Hospital Stay (HOSPITAL_COMMUNITY)

## 2024-12-30 DIAGNOSIS — K4030 Unilateral inguinal hernia, with obstruction, without gangrene, not specified as recurrent: Secondary | ICD-10-CM | POA: Diagnosis not present

## 2024-12-30 LAB — BASIC METABOLIC PANEL WITH GFR
Anion gap: 9 (ref 5–15)
BUN: 9 mg/dL (ref 8–23)
CO2: 20 mmol/L — ABNORMAL LOW (ref 22–32)
Calcium: 8.7 mg/dL — ABNORMAL LOW (ref 8.9–10.3)
Chloride: 109 mmol/L (ref 98–111)
Creatinine, Ser: 0.75 mg/dL (ref 0.61–1.24)
GFR, Estimated: >60 mL/min
Glucose, Bld: 105 mg/dL — ABNORMAL HIGH (ref 70–99)
Potassium: 3.7 mmol/L (ref 3.5–5.1)
Sodium: 139 mmol/L (ref 135–145)

## 2024-12-30 LAB — CBC
HCT: 37.3 % — ABNORMAL LOW (ref 39.0–52.0)
Hemoglobin: 11.3 g/dL — ABNORMAL LOW (ref 13.0–17.0)
MCH: 28.2 pg (ref 26.0–34.0)
MCHC: 30.3 g/dL (ref 30.0–36.0)
MCV: 93 fL (ref 80.0–100.0)
Platelets: 188 K/uL (ref 150–400)
RBC: 4.01 MIL/uL — ABNORMAL LOW (ref 4.22–5.81)
RDW: 13.8 % (ref 11.5–15.5)
WBC: 4.6 K/uL (ref 4.0–10.5)
nRBC: 0 % (ref 0.0–0.2)

## 2024-12-30 MED ORDER — FUROSEMIDE 20 MG PO TABS
20.0000 mg | ORAL_TABLET | Freq: Once | ORAL | Status: AC
  Administered 2024-12-30: 20 mg via ORAL
  Filled 2024-12-30: qty 1

## 2024-12-30 MED ORDER — LIDOCAINE HCL URETHRAL/MUCOSAL 2 % EX GEL
1.0000 | Freq: Once | CUTANEOUS | Status: AC
  Administered 2024-12-30: 1 via URETHRAL
  Filled 2024-12-30: qty 5

## 2024-12-30 MED ORDER — TAMSULOSIN HCL 0.4 MG PO CAPS
0.4000 mg | ORAL_CAPSULE | Freq: Every day | ORAL | Status: DC
  Administered 2024-12-30: 0.4 mg via ORAL
  Filled 2024-12-30: qty 1

## 2024-12-30 MED ORDER — LACTATED RINGERS IV SOLN: INTRAVENOUS | Status: DC

## 2024-12-30 MED ORDER — CHLORHEXIDINE GLUCONATE CLOTH 2 % EX PADS
6.0000 | MEDICATED_PAD | Freq: Every day | CUTANEOUS | Status: DC
  Administered 2024-12-30: 6 via TOPICAL

## 2024-12-30 NOTE — Progress Notes (Signed)
 SATURATION QUALIFICATIONS: (This note is used to comply with regulatory documentation for home oxygen )  Patient Saturations on Room Air at Rest = 88%  Patient Saturations on Room Air while Ambulating = n/a  Patient Saturations on 3 Liters of oxygen  while Ambulating = 93%  Please briefly explain why patient needs home oxygen :  Pt SpO2 drops below 90% with minimal movement and requires 3L with ambulation to remain above 90%.

## 2024-12-30 NOTE — TOC Progression Note (Signed)
 Transition of Care [facility]) - Progression Note   Patient Details  Name: Maurice Wood MRN: [id number] Date of Birth: [birth year]  Transition of Care [facility]) CM/SW Contact  Duwaine GORMAN Aran, LCSW Phone Number: 12/30/2024, 2:36 PM  Clinical Narrative: CSW met with patient and spouse regarding HH offers. [facility] [place] made offers. Patient and spouse agreeable to [facility] as the patient used the agency last year. CSW reached out to [place] with [employer] and patient's home oxygen  was closed out last year, so patient will need to be screened. CSW notified hospitalist. HH orders requested. Care management to follow.  Expected Discharge Plan: Home w Home Health Services Barriers to Discharge: Continued Medical Work up  Expected Discharge Plan and Services In-house Referral: Clinical Social Work Discharge Planning Services: CM Consult Post Acute Care Choice: Home Health Living arrangements for the past 2 months: Single Family Home          DME Arranged: Oxygen  DME Agency: [employer] Date DME Agency Contacted: 12/29/24 Time DME Agency Contacted: [phone] Representative spoke with at DME Agency: Portal HH Arranged: PT, OT HH Agency: [facility] Date [facility] Agency Contacted: 12/29/24 Representative spoke with at [facility] Agency: Referral made in hub  Social Drivers of Health (SDOH) Interventions SDOH Screenings   Food Insecurity: No Food Insecurity (12/24/2024)  Housing: Low Risk (12/24/2024)  Transportation Needs: No Transportation Needs (12/24/2024)  Utilities: Not At Risk (12/24/2024)  Social Connections: Moderately Isolated (12/24/2024)  Tobacco Use: Medium Risk (12/24/2024)   Readmission Risk Interventions    03/23/2024    9:40 AM  Readmission Risk Prevention Plan  Transportation Screening Complete  PCP or Specialist Appt within 5-7 Days Complete  Home Care Screening Complete  Medication Review (RN CM) Complete

## 2024-12-30 NOTE — Progress Notes (Signed)
 "  Progress Note  6 Days Post-Op  Subjective: Wife at bedside and assists with history.  Patient reports having stools yesterday. Patient states that he has been urinating. Urine output recorded 625 mL form 1/20-1/21. Denies pain. Reports one episode of emesis yesterday after dinner. Denies nausea currently. Tolerated breakfast this morning without concerns.   ROS  All negative with the exception of above.  Objective: Vital signs in last 24 hours: Temp:  [97.7 F (36.5 C)-98.6 F (37 C)] 98.6 F (37 C) (01/21 0456) Pulse Rate:  [56-89] 71 (01/21 0456) Resp:  [17-19] 17 (01/21 0456) BP: (101-111)/(63-73) 106/73 (01/21 0456) SpO2:  [92 %-99 %] 98 % (01/21 0925) Last BM Date : 12/28/24  Intake/Output from previous day: 01/20 0701 - 01/21 0700 In: 20 [P.O.:20] Out: 725 [Urine:625; Emesis/NG output:100] Intake/Output this shift: No intake/output data recorded.  PE: General: Pleasant male who is laying in bed in NAD. HEENT: Head is normocephalic, atraumatic.  Heart: HR normal during encounter.  Lungs:  Respiratory effort nonlabored on [place]. Abd: Soft, NT, ND. Right lower abdominal incision with staples C/D/I. Some ecchymosis around incision. No rebound tenderness or guarding.  Skin: Warm and dry.   Lab Results:  Recent Labs    12/29/24 0038 12/30/24 0352  WBC 6.7 4.6  HGB 11.7* 11.3*  HCT 36.4* 37.3*  PLT 170 188   BMET Recent Labs    12/29/24 0038 12/30/24 0352  NA 138 139  K 3.8 3.7  CL 109 109  CO2 21* 20*  GLUCOSE 130* 105*  BUN 9 9  CREATININE 0.72 0.75  CALCIUM  9.0 8.7*   PT/INR No results for input(s): LABPROT, INR in the last 72 hours. CMP     Component Value Date/Time   NA 139 12/30/2024 0352   K 3.7 12/30/2024 0352   CL 109 12/30/2024 0352   CO2 20 (L) 12/30/2024 0352   GLUCOSE 105 (H) 12/30/2024 0352   BUN 9 12/30/2024 0352   CREATININE 0.75 12/30/2024 0352   CALCIUM  8.7 (L) 12/30/2024 0352   PROT 5.5 (L) 12/25/2024 0602   ALBUMIN  3.1 (L) 12/25/2024 0602   AST 20 12/25/2024 0602   ALT 8 12/25/2024 0602   ALKPHOS 70 12/25/2024 0602   BILITOT 1.4 (H) 12/25/2024 0602   GFRNONAA >60 12/30/2024 0352   GFRAA >60 07/05/2020 0728   Lipase     Component Value Date/Time   LIPASE <10 (L) 12/24/2024 1251       Studies/Results: DG Chest Port 1 View Result Date: 12/30/2024 CLINICAL DATA:  Hypoxia. EXAM: PORTABLE CHEST 1 VIEW COMPARISON:  10/02/2024 FINDINGS: The cardio pericardial silhouette is enlarged. There is pulmonary vascular congestion without overt pulmonary edema. Retrocardiac left base atelectasis or infiltrate evident. Blunting of the costophrenic angles bilaterally suggest the presence of tiny effusions. No acute bony abnormality. Degenerative changes noted left shoulder. Telemetry leads overlie the chest. IMPRESSION: 1. Enlargement of the cardiopericardial silhouette with pulmonary vascular congestion. 2. Retrocardiac left base atelectasis or infiltrate. 3. Probable tiny bilateral pleural effusions. Electronically Signed   By: Camellia Candle M.D.   On: 12/30/2024 07:43    Anti-infectives: Anti-infectives (From admission, onward)    Start     Dose/Rate Route Frequency Ordered Stop   12/28/24 1800  ceFEPIme  (MAXIPIME ) 2 g in sodium chloride  0.9 % 100 mL IVPB        2 g 200 mL/hr over 30 Minutes Intravenous Every 12 hours 12/28/24 1215 12/28/24 1902   12/25/24 1600  vancomycin  (VANCOCIN )  IVPB 1000 mg/200 mL premix  Status:  Discontinued        1,000 mg 200 mL/hr over 60 Minutes Intravenous Every 24 hours 12/25/24 0010 12/25/24 0909   12/25/24 1600  vancomycin  (VANCOREADY) IVPB 750 mg/150 mL  Status:  Discontinued        750 mg 150 mL/hr over 60 Minutes Intravenous Every 24 hours 12/25/24 0909 12/25/24 1253   12/25/24 0600  ceFEPIme  (MAXIPIME ) 2 g in sodium chloride  0.9 % 100 mL IVPB  Status:  Discontinued        2 g 200 mL/hr over 30 Minutes Intravenous Every 12 hours 12/25/24 0008 12/28/24 1215   12/24/24  1715  ceFAZolin  (ANCEF ) IVPB 2g/100 mL premix       Placed in And Linked Group   2 g 200 mL/hr over 30 Minutes Intravenous On call to O.R. 12/24/24 1639 12/24/24 1831   12/24/24 1715  metroNIDAZOLE  (FLAGYL ) IVPB 500 mg       Placed in And Linked Group   500 mg 100 mL/hr over 60 Minutes Intravenous On call to O.R. 12/24/24 1639 12/24/24 1843   12/24/24 1445  cefTRIAXone  (ROCEPHIN ) 1 g in sodium chloride  0.9 % 100 mL IVPB  Status:  Discontinued        1 g 200 mL/hr over 30 Minutes Intravenous  Once 12/24/24 1433 12/24/24 1440   12/24/24 1445  vancomycin  (VANCOCIN ) IVPB 1000 mg/200 mL premix  Status:  Discontinued        1,000 mg 200 mL/hr over 60 Minutes Intravenous  Once 12/24/24 1441 12/24/24 1443   12/24/24 1445  piperacillin -tazobactam (ZOSYN ) IVPB 3.375 g        3.375 g 100 mL/hr over 30 Minutes Intravenous  Once 12/24/24 1441 12/24/24 1554   12/24/24 1445  vancomycin  (VANCOREADY) IVPB 1250 mg/250 mL        1,250 mg 166.7 mL/hr over 90 Minutes Intravenous  Once 12/24/24 1443 12/24/24 1807        Assessment/Plan POD6: S/P REPAIR, HERNIA, INGUINAL, ADULT WITH MESH, DIAGNOSTIC LAPAROSCOPY by Dr. Ebbie 12/24/2024 -Afebrile. -WBC 4.6; HGB 11.3 -Exam without significant concerns. -Tolerated breakfast this AM. No n/v since then. Had BM yesterday. Denies pain. -Continue diet as tolerated. -Continue to [employer] for discharge from surgery standpoint once cleared by medical team. No Rx needed as he has not required pain meds in several days.   FEN: Regular; IVF per primary team VTE: Eliquis  ID: None currently    LOS: 6 days   I reviewed specialist notes, hospitalist notes, nursing notes, last 24 h vitals and pain scores, last 48 h intake and output, last 24 h labs and trends, and last 24 h imaging results.  Marjorie Carlyon Favre, [facility] 12/30/2024, 9:47 AM Please see Amion for pager number during day hours 7:00am-4:30pm  "

## 2024-12-30 NOTE — Progress Notes (Signed)
" °   12/30/24 1955  BiPAP/CPAP/SIPAP  Reason BIPAP/CPAP not in use Non-compliant (pt refusing cpap for the night)    "

## 2024-12-30 NOTE — Progress Notes (Signed)
 " PROGRESS NOTE    Maurice Wood  FMW:[id number] DOB: [birth year] DOA: 12/24/2024 PCP: Seabron Alm, MD  Chief Complaint  Patient presents with   Abdominal Pain    Brief Narrative:   Maurice Wood is Maurice Wood 89 y.o. male with medical history of Vanesha [place] fib, COPD, nephrolithiasis, Chronic pain who is sent from the urology office for an inguinal hernia that was difficult to reduce. On 1/12 shock wave lithotripsy was going to be performed on an 11 mm UPJ stone in left renal pelvis, but he was noted to be hypoxic and the procedure was aborted.  He was then discharged home. He subsequently began to have vomiting and developed pain in right lower abdomen. He continued to have this pain and vomiting or dry heaving for days. On the day of admission, he followed up with urology, underwent Farley Crooker CT scan, was found to have an incarcerated R inguinal hernia and was recommended to come to the ED.   Assessment & Plan:   Principal Problem:   Incarcerated inguinal hernia Active Problems:   OBSTRUCTIVE SLEEP APNEA   Atrial fibrillation, permanent (HCC)   COPD (chronic obstructive pulmonary disease) (HCC)  Incarcerated Inguinal hernia S/p diagnostic laparoscopy and R inguinal hernia repair with vicryl mesh 1/15 NGT out 1/17 Tolerating regular diet, stable for PO intake Stable for discharge from surgery standpoint  Suprapubic Edema  Scrotal Edema Related to above  Oliguria  Difficulty Voiding Per discussion with RN, 200 cc UOP since 0600 today (about 22 cc/hr, which is less than 0.5 mg/kg/hr) I/O cath tried yesterday, failed due to his edema (bladder scans have been presumed inaccurate - showing 0 or almost 0 ml - due to his suprapubic soft tissue edema) flomax  Suspect he may be retaining, in which case foley placement would be appropriate - if not retaining, foley placement for accurate UOP will be helpful.  Will place foley, if needed will consult with urology.   COPD Oxygen  Requirement CXR 1/21 with  enlargement of cardiopericardial silhouette with pulmonary vascular congestion, tiny bilateral effusions, retrocardiac L base atelectasis or infiltrate Will trial dose of lasix   Appears he's going to need O2 with activity at discharge  HFpEF Echo 03/2024 with preserved EF Giving dose of lasix  x1 today At home takes lasix  once Currie Dennin week  AKI  Creatinine peaked at 1.3 Improved  Permanent Atrial Fibrillation Eliquis    Delirium Post anesthesia  Improved Delirium precautions    DVT prophylaxis: eliquis  Code Status: full Family Communication: wife at bedside Disposition:   Status is: Inpatient Remains inpatient appropriate because: need for continued inpatient care   Consultants:  surgery  Procedures:  1/15 Procedure: 1.  Right inguinal hernia repair with Vicryl mesh 2.  Diagnostic laparoscopy  Antimicrobials:  Anti-infectives (From admission, onward)    Start     Dose/Rate Route Frequency Ordered Stop   12/28/24 1800  ceFEPIme  (MAXIPIME ) 2 g in sodium chloride  0.9 % 100 mL IVPB        2 g 200 mL/hr over 30 Minutes Intravenous Every 12 hours 12/28/24 1215 12/28/24 1902   12/25/24 1600  vancomycin  (VANCOCIN ) IVPB 1000 mg/200 mL premix  Status:  Discontinued        1,000 mg 200 mL/hr over 60 Minutes Intravenous Every 24 hours 12/25/24 0010 12/25/24 0909   12/25/24 1600  vancomycin  (VANCOREADY) IVPB 750 mg/150 mL  Status:  Discontinued        750 mg 150 mL/hr over 60 Minutes Intravenous Every 24 hours 12/25/24 0909  12/25/24 1253   12/25/24 0600  ceFEPIme  (MAXIPIME ) 2 g in sodium chloride  0.9 % 100 mL IVPB  Status:  Discontinued        2 g 200 mL/hr over 30 Minutes Intravenous Every 12 hours 12/25/24 0008 12/28/24 1215   12/24/24 1715  ceFAZolin  (ANCEF ) IVPB 2g/100 mL premix       Placed in And Linked Group   2 g 200 mL/hr over 30 Minutes Intravenous On call to O.R. 12/24/24 1639 12/24/24 1831   12/24/24 1715  metroNIDAZOLE  (FLAGYL ) IVPB 500 mg       Placed in And  Linked Group   500 mg 100 mL/hr over 60 Minutes Intravenous On call to O.R. 12/24/24 1639 12/24/24 1843   12/24/24 1445  cefTRIAXone  (ROCEPHIN ) 1 g in sodium chloride  0.9 % 100 mL IVPB  Status:  Discontinued        1 g 200 mL/hr over 30 Minutes Intravenous  Once 12/24/24 1433 12/24/24 1440   12/24/24 1445  vancomycin  (VANCOCIN ) IVPB 1000 mg/200 mL premix  Status:  Discontinued        1,000 mg 200 mL/hr over 60 Minutes Intravenous  Once 12/24/24 1441 12/24/24 1443   12/24/24 1445  piperacillin -tazobactam (ZOSYN ) IVPB 3.375 g        3.375 g 100 mL/hr over 30 Minutes Intravenous  Once 12/24/24 1441 12/24/24 1554   12/24/24 1445  vancomycin  (VANCOREADY) IVPB 1250 mg/250 mL        1,250 mg 166.7 mL/hr over 90 Minutes Intravenous  Once 12/24/24 1443 12/24/24 1807       Subjective: No new complaints Wife at bedside waiting to hear about discharge plans  Objective: Vitals:   12/30/24 1232 12/30/24 1300 12/30/24 1302 12/30/24 1303  BP: 108/70     Pulse: 75     Resp: 15     Temp: 97.7 F (36.5 C)     TempSrc: Oral     SpO2: 100% 98% (!) 89% 96%  Weight:      Height:        Intake/Output Summary (Last 24 hours) at 12/30/2024 1602 Last data filed at 12/30/2024 1500 Gross per 24 hour  Intake 95 ml  Output 450 ml  Net -355 ml   Filed Weights   12/24/24 1229 12/24/24 1759  Weight: 68.9 kg 68.9 kg    Examination:  General exam: Appears calm and comfortable  Respiratory system: unlabored Cardiovascular system: RRR Gastrointestinal system: significant suprapubic edema, scrotal edema Central nervous system: Alert and oriented. No focal neurological deficits. Extremities: no LEE   Data Reviewed: I have personally reviewed following labs and imaging studies  CBC: Recent Labs  Lab 12/24/24 1251 12/25/24 0602 12/26/24 0520 12/29/24 0038 12/30/24 0352  WBC 13.6* 10.6* 8.9 6.7 4.6  HGB 14.6 12.4* 12.1* 11.7* 11.3*  HCT 45.4 40.0 38.8* 36.4* 37.3*  MCV 90.8 93.2 93.7 90.5  93.0  PLT 209 168 161 170 188    Basic Metabolic Panel: Recent Labs  Lab 12/24/24 1251 12/25/24 0602 12/26/24 0520 12/29/24 0038 12/30/24 0352  NA 136 138 141 138 139  K 5.4* 4.6 4.6 3.8 3.7  CL 102 105 108 109 109  CO2 22 21* 21* 21* 20*  GLUCOSE 126* 109* 86 130* 105*  BUN 19 18 16 9 9   CREATININE 1.19 1.32* 1.10 0.72 0.75  CALCIUM  10.0 8.9 9.2 9.0 8.7*  MG  --   --   --  2.1  --     GFR: Estimated Creatinine  Clearance: 61 mL/min (by C-G formula based on SCr of 0.75 mg/dL).  Liver Function Tests: Recent Labs  Lab 12/24/24 1251 12/25/24 0602  AST 40 20  ALT 15 8  ALKPHOS 81 70  BILITOT 2.0* 1.4*  PROT 7.4 5.5*  ALBUMIN 3.8 3.1*    CBG: Recent Labs  Lab 12/28/24 0815  GLUCAP 106*     Recent Results (from the past 240 hours)  MRSA Next Gen by PCR, Nasal     Status: None   Collection Time: 12/25/24 11:00 AM   Specimen: Nasal Mucosa; Nasal Swab  Result Value Ref Range Status   MRSA by PCR Next Gen NOT DETECTED NOT DETECTED Final    Comment: (NOTE) The GeneXpert MRSA Assay (FDA approved for NASAL specimens only), is one component of Loryn Haacke comprehensive MRSA colonization surveillance program. It is not intended to diagnose MRSA infection nor to guide or monitor treatment for MRSA infections. Test performance is not FDA approved in patients less than 27 years old. Performed at [facility], [address], [place], [place] [address]          Radiology Studies: [facility] Chest Port 1 View Result Date: 12/30/2024 CLINICAL DATA:  Hypoxia. EXAM: PORTABLE CHEST 1 VIEW COMPARISON:  10/02/2024 FINDINGS: The cardio pericardial silhouette is enlarged. There is pulmonary vascular congestion without overt pulmonary edema. Retrocardiac left base atelectasis or infiltrate evident. Blunting of the costophrenic angles bilaterally suggest the presence of tiny effusions. No acute bony abnormality. Degenerative changes noted left shoulder. Telemetry leads overlie  the chest. IMPRESSION: 1. Enlargement of the cardiopericardial silhouette with pulmonary vascular congestion. 2. Retrocardiac left base atelectasis or infiltrate. 3. Probable tiny bilateral pleural effusions. Electronically Signed   By: Camellia Candle M.D.   On: 12/30/2024 07:43        Scheduled Meds:  acetaminophen   1,000 mg Oral Q6H   apixaban   5 mg Oral BID   bisacodyl   10 mg Rectal Daily   budesonide -glycopyrrolate -formoterol   2 puff Inhalation BID   docusate sodium   100 mg Oral BID   feeding supplement  1 Container Oral TID BM   sodium chloride  flush  10-40 mL Intracatheter Q12H   tamsulosin   0.4 mg Oral QPC supper   Continuous Infusions:   LOS: 6 days    Time spent: over 30 min    Meliton Monte, MD [facility]   To contact the attending provider between 7A-7P or the covering provider during after hours 7P-7A, please log into the web site [email or web address] and access using universal [place] password for that web site. If you do not have the password, please call the hospital operator.  12/30/2024, 4:02 PM    "

## 2024-12-30 NOTE — Plan of Care (Signed)
" °  Problem: Clinical Measurements: Goal: Ability to maintain clinical measurements within normal limits will improve 12/30/2024 1704 by Bailee Metter, Damien SAUNDERS, RN Outcome: Progressing 12/30/2024 1704 by Lari Damien SAUNDERS, RN Outcome: Progressing Goal: Will remain free from infection 12/30/2024 1704 by Lari Damien SAUNDERS, RN Outcome: Progressing 12/30/2024 1704 by Lari Damien SAUNDERS, RN Outcome: Progressing Goal: Diagnostic test results will improve 12/30/2024 1704 by Lari Damien SAUNDERS, RN Outcome: Progressing 12/30/2024 1704 by Lari Damien SAUNDERS, RN Outcome: Progressing Goal: Respiratory complications will improve 12/30/2024 1704 by Lari Damien SAUNDERS, RN Outcome: Progressing 12/30/2024 1704 by Lari Damien SAUNDERS, RN Outcome: Progressing Goal: Cardiovascular complication will be avoided 12/30/2024 1704 by Lari Damien SAUNDERS, RN Outcome: Progressing 12/30/2024 1704 by Lari Damien SAUNDERS, RN Outcome: Progressing   Problem: Health Behavior/Discharge Planning: Goal: Ability to manage health-related needs will improve 12/30/2024 1704 by Lari Damien SAUNDERS, RN Outcome: Progressing 12/30/2024 1704 by Lari Damien SAUNDERS, RN Outcome: Progressing   "

## 2024-12-31 ENCOUNTER — Other Ambulatory Visit (HOSPITAL_COMMUNITY): Payer: Self-pay

## 2024-12-31 DIAGNOSIS — K4030 Unilateral inguinal hernia, with obstruction, without gangrene, not specified as recurrent: Secondary | ICD-10-CM | POA: Diagnosis not present

## 2024-12-31 LAB — CBC WITH DIFFERENTIAL/PLATELET
Abs Immature Granulocytes: 0.09 K/uL — ABNORMAL HIGH (ref 0.00–0.07)
Basophils Absolute: 0 K/uL (ref 0.0–0.1)
Basophils Relative: 0 %
Eosinophils Absolute: 0.1 K/uL (ref 0.0–0.5)
Eosinophils Relative: 1 %
HCT: 36.4 % — ABNORMAL LOW (ref 39.0–52.0)
Hemoglobin: 11.7 g/dL — ABNORMAL LOW (ref 13.0–17.0)
Immature Granulocytes: 1 %
Lymphocytes Relative: 17 %
Lymphs Abs: 1.2 K/uL (ref 0.7–4.0)
MCH: 29 pg (ref 26.0–34.0)
MCHC: 32.1 g/dL (ref 30.0–36.0)
MCV: 90.3 fL (ref 80.0–100.0)
Monocytes Absolute: 0.4 K/uL (ref 0.1–1.0)
Monocytes Relative: 7 %
Neutro Abs: 4.9 K/uL (ref 1.7–7.7)
Neutrophils Relative %: 74 %
Platelets: 213 K/uL (ref 150–400)
RBC: 4.03 MIL/uL — ABNORMAL LOW (ref 4.22–5.81)
RDW: 14.1 % (ref 11.5–15.5)
WBC: 6.7 K/uL (ref 4.0–10.5)
nRBC: 0 % (ref 0.0–0.2)

## 2024-12-31 LAB — COMPREHENSIVE METABOLIC PANEL WITH GFR
ALT: 5 U/L (ref 0–44)
AST: 18 U/L (ref 15–41)
Albumin: 2.9 g/dL — ABNORMAL LOW (ref 3.5–5.0)
Alkaline Phosphatase: 74 U/L (ref 38–126)
Anion gap: 10 (ref 5–15)
BUN: 13 mg/dL (ref 8–23)
CO2: 21 mmol/L — ABNORMAL LOW (ref 22–32)
Calcium: 8.6 mg/dL — ABNORMAL LOW (ref 8.9–10.3)
Chloride: 107 mmol/L (ref 98–111)
Creatinine, Ser: 0.77 mg/dL (ref 0.61–1.24)
GFR, Estimated: >60 mL/min
Glucose, Bld: 163 mg/dL — ABNORMAL HIGH (ref 70–99)
Potassium: 3.2 mmol/L — ABNORMAL LOW (ref 3.5–5.1)
Sodium: 138 mmol/L (ref 135–145)
Total Bilirubin: 0.6 mg/dL (ref 0.0–1.2)
Total Protein: 5.4 g/dL — ABNORMAL LOW (ref 6.5–8.1)

## 2024-12-31 LAB — PHOSPHORUS: Phosphorus: 1.7 mg/dL — ABNORMAL LOW (ref 2.5–4.6)

## 2024-12-31 LAB — MAGNESIUM: Magnesium: 1.6 mg/dL — ABNORMAL LOW (ref 1.7–2.4)

## 2024-12-31 MED ORDER — TAMSULOSIN HCL 0.4 MG PO CAPS
0.4000 mg | ORAL_CAPSULE | Freq: Every day | ORAL | 0 refills | Status: AC
  Filled 2024-12-31: qty 30, 30d supply, fill #0

## 2024-12-31 MED ORDER — K PHOS MONO-SOD PHOS DI & MONO 155-852-130 MG PO TABS
500.0000 mg | ORAL_TABLET | Freq: Two times a day (BID) | ORAL | 0 refills | Status: AC
  Filled 2024-12-31: qty 4, 1d supply, fill #0

## 2024-12-31 MED ORDER — POTASSIUM CHLORIDE CRYS ER 20 MEQ PO TBCR
40.0000 meq | EXTENDED_RELEASE_TABLET | ORAL | Status: AC
  Administered 2024-12-31 (×2): 40 meq via ORAL
  Filled 2024-12-31 (×2): qty 2

## 2024-12-31 MED ORDER — K PHOS MONO-SOD PHOS DI & MONO 155-852-130 MG PO TABS
500.0000 mg | ORAL_TABLET | Freq: Four times a day (QID) | ORAL | Status: DC
  Administered 2024-12-31 (×2): 500 mg via ORAL
  Filled 2024-12-31 (×2): qty 2

## 2024-12-31 MED ORDER — MAGNESIUM SULFATE 2 GM/50ML IV SOLN
2.0000 g | Freq: Once | INTRAVENOUS | Status: AC
  Administered 2024-12-31: 2 g via INTRAVENOUS
  Filled 2024-12-31: qty 50

## 2024-12-31 NOTE — Progress Notes (Signed)
 "  Progress Note  7 Days Post-Op  Subjective: Pt wife at bedside. Feels well overall.   He did have a urinary catheter placed yesterday due to concern of retention but this has been removed. States that he has urinated a small amount since removal of the catheter.   Denies new concerns. Reports no abdominal pain. Denies scrotal pain. Has some edema but no concerns of worsening edema of suprapubic/scrotal region. He is tolerating PO diet. Having bowel function and passing flatus. Denies nausea/vomiting.   ROS  All negative with the exception of above.  Objective: Vital signs in last 24 hours: Temp:  [97.7 F (36.5 C)-97.8 F (36.6 C)] 97.8 F (36.6 C) (01/22 1323) Pulse Rate:  [82-88] 88 (01/22 1323) Resp:  [12-14] 14 (01/22 0427) BP: (103-106)/(61-66) 106/61 (01/22 1323) SpO2:  [96 %-100 %] 100 % (01/22 1323) Last BM Date : 12/30/24  Intake/Output from previous day: 01/21 0701 - 01/22 0700 In: 799.9 [P.O.:115; I.V.:684.9] Out: 650 [Urine:650] Intake/Output this shift: No intake/output data recorded.  PE: General: Pleasant male who is laying in bed in NAD. HEENT: Head is normocephalic, atraumatic.  Heart: HR normal during encounter.  Lungs:  Respiratory effort nonlabored on [place]. Abd: Soft, NT, ND. Right lower abdominal incision with staples C/D/I. Some ecchymosis around incision. No rebound tenderness or guarding.  Skin: Edema noted of suprapubic region/scrotum. No worsening edema or firmness of the skin of these regions. No erythema, tenderness to palpation, weeping of the skin. No concern of active bleeding, hematoma, purulent discharge.   Lab Results:  Recent Labs    12/30/24 0352 12/31/24 0851  WBC 4.6 6.7  HGB 11.3* 11.7*  HCT 37.3* 36.4*  PLT 188 213   BMET Recent Labs    12/30/24 0352 12/31/24 0851  NA 139 138  K 3.7 3.2*  CL 109 107  CO2 20* 21*  GLUCOSE 105* 163*  BUN 9 13  CREATININE 0.75 0.77  CALCIUM  8.7* 8.6*   PT/INR No results for  input(s): LABPROT, INR in the last 72 hours. CMP     Component Value Date/Time   NA 138 12/31/2024 0851   K 3.2 (L) 12/31/2024 0851   CL 107 12/31/2024 0851   CO2 21 (L) 12/31/2024 0851   GLUCOSE 163 (H) 12/31/2024 0851   BUN 13 12/31/2024 0851   CREATININE 0.77 12/31/2024 0851   CALCIUM  8.6 (L) 12/31/2024 0851   PROT 5.4 (L) 12/31/2024 0851   ALBUMIN 2.9 (L) 12/31/2024 0851   AST 18 12/31/2024 0851   ALT 5 12/31/2024 0851   ALKPHOS 74 12/31/2024 0851   BILITOT 0.6 12/31/2024 0851   GFRNONAA >60 12/31/2024 0851   GFRAA >60 07/05/2020 0728   Lipase     Component Value Date/Time   LIPASE <10 (L) 12/24/2024 1251       Studies/Results: DG Chest Port 1 View Result Date: 12/30/2024 CLINICAL DATA:  Hypoxia. EXAM: PORTABLE CHEST 1 VIEW COMPARISON:  10/02/2024 FINDINGS: The cardio pericardial silhouette is enlarged. There is pulmonary vascular congestion without overt pulmonary edema. Retrocardiac left base atelectasis or infiltrate evident. Blunting of the costophrenic angles bilaterally suggest the presence of tiny effusions. No acute bony abnormality. Degenerative changes noted left shoulder. Telemetry leads overlie the chest. IMPRESSION: 1. Enlargement of the cardiopericardial silhouette with pulmonary vascular congestion. 2. Retrocardiac left base atelectasis or infiltrate. 3. Probable tiny bilateral pleural effusions. Electronically Signed   By: Camellia Candle M.D.   On: 12/30/2024 07:43    Anti-infectives: Anti-infectives (From admission,  onward)    Start     Dose/Rate Route Frequency Ordered Stop   12/28/24 1800  ceFEPIme  (MAXIPIME ) 2 g in sodium chloride  0.9 % 100 mL IVPB        2 g 200 mL/hr over 30 Minutes Intravenous Every 12 hours 12/28/24 1215 12/28/24 1902   12/25/24 1600  vancomycin  (VANCOCIN ) IVPB 1000 mg/200 mL premix  Status:  Discontinued        1,000 mg 200 mL/hr over 60 Minutes Intravenous Every 24 hours 12/25/24 0010 12/25/24 0909   12/25/24 1600   vancomycin  (VANCOREADY) IVPB 750 mg/150 mL  Status:  Discontinued        750 mg 150 mL/hr over 60 Minutes Intravenous Every 24 hours 12/25/24 0909 12/25/24 1253   12/25/24 0600  ceFEPIme  (MAXIPIME ) 2 g in sodium chloride  0.9 % 100 mL IVPB  Status:  Discontinued        2 g 200 mL/hr over 30 Minutes Intravenous Every 12 hours 12/25/24 0008 12/28/24 1215   12/24/24 1715  ceFAZolin  (ANCEF ) IVPB 2g/100 mL premix       Placed in And Linked Group   2 g 200 mL/hr over 30 Minutes Intravenous On call to O.R. 12/24/24 1639 12/24/24 1831   12/24/24 1715  metroNIDAZOLE  (FLAGYL ) IVPB 500 mg       Placed in And Linked Group   500 mg 100 mL/hr over 60 Minutes Intravenous On call to O.R. 12/24/24 1639 12/24/24 1843   12/24/24 1445  cefTRIAXone  (ROCEPHIN ) 1 g in sodium chloride  0.9 % 100 mL IVPB  Status:  Discontinued        1 g 200 mL/hr over 30 Minutes Intravenous  Once 12/24/24 1433 12/24/24 1440   12/24/24 1445  vancomycin  (VANCOCIN ) IVPB 1000 mg/200 mL premix  Status:  Discontinued        1,000 mg 200 mL/hr over 60 Minutes Intravenous  Once 12/24/24 1441 12/24/24 1443   12/24/24 1445  piperacillin -tazobactam (ZOSYN ) IVPB 3.375 g        3.375 g 100 mL/hr over 30 Minutes Intravenous  Once 12/24/24 1441 12/24/24 1554   12/24/24 1445  vancomycin  (VANCOREADY) IVPB 1250 mg/250 mL        1,250 mg 166.7 mL/hr over 90 Minutes Intravenous  Once 12/24/24 1443 12/24/24 1807        Assessment/Plan POD7: S/P REPAIR, HERNIA, INGUINAL, ADULT WITH MESH, DIAGNOSTIC LAPAROSCOPY by Dr. Ebbie 12/24/2024 -Afebrile. -WBC 6.7; HGB 11.7 -Tolerated PO intake. No n/v. Had BM/flatulence. Denies pain. -Discussed in great detail conservative therapies regarding scrotal edema. Recommended scrotal support, ice, and supportive undergarments. Provided precautions in great detail. Patient understands to contact our office should he have any concerns.  -Continue diet as tolerated. -Continue to [employer] for  discharge from surgery standpoint once cleared by medical team. No Rx needed as he has not required pain meds in several days.    FEN: Regular; IVF per primary team VTE: Eliquis  ID: None currently   LOS: 7 days   I reviewed specialist notes, nursing notes, hospitalist notes, last 24 h vitals and pain scores, last 48 h intake and output, last 24 h labs and trends, and last 24 h imaging results.  Marjorie Carlyon Favre, [facility] 12/31/2024, 2:23 PM Please see Amion for pager number during day hours 7:00am-4:30pm  "

## 2024-12-31 NOTE — TOC Transition Note (Signed)
 Transition of Care [facility]) - Discharge Note  Patient Details  Name: Maurice Wood MRN: [id number] Date of Birth: 1933/08/12  Transition [facility]) CM/SW Contact:  Duwaine GORMAN Aran, LCSW Phone Number: 12/31/2024, 1:58 PM  Clinical Narrative: Patient will discharge home with home oxygen  and HHPT through [facility]. Oxygen  delivered to patient's room by [employer]. CSW notified Cindie with [facility] of discharge. HH orders placed by hospitalist. Care management signing off.  Final next level of care: Home w Home Health Services Barriers to Discharge: Barriers Resolved  Patient Goals and CMS Choice Patient states their goals for this hospitalization and ongoing recovery are:: Home CMS Medicare.gov Compare Post Acute Care list provided to:: Patient Choice offered to / list presented to : Patient, Spouse  Discharge Plan and Services Additional resources added to the After Visit Summary for   In-house Referral: Clinical Social Work Discharge Planning Services: CM Consult Post Acute Care Choice: Home Health          DME Arranged: Oxygen  DME Agency: [employer] Date DME Agency Contacted: 12/31/24 Time DME Agency Contacted: [phone] Representative spoke with at DME Agency: [facility] HH Arranged: PT HH Agency: [facility] Date [facility] Contacted: 12/29/24 Representative spoke with at [facility] Agency: Referral made in hub  Social Drivers of Health (SDOH) Interventions SDOH Screenings   Food Insecurity: No Food Insecurity (12/24/2024)  Housing: Low Risk (12/24/2024)  Transportation Needs: No Transportation Needs (12/24/2024)  Utilities: Not At Risk (12/24/2024)  Social Connections: Moderately Isolated (12/24/2024)  Tobacco Use: Medium Risk (12/24/2024)   Readmission Risk Interventions    03/23/2024    9:40 AM  Readmission Risk Prevention Plan  Transportation Screening Complete  PCP or Specialist Appt within 5-7 Days Complete  Home Care Screening Complete  Medication Review (RN CM) Complete

## 2024-12-31 NOTE — Discharge Summary (Signed)
 Physician Discharge Summary  Maurice Wood FMW:[id number] DOB: 1933-10-06 DOA: 12/24/2024  PCP: Seabron Alm, MD  Admit date: 12/24/2024 Discharge date: 12/31/2024  Time spent: 40 minutes  Recommendations for Outpatient Follow-up:  Follow outpatient CBC/CMP  Follow with surgery outpatient as scheduled for staple removal and post op follow up Follow suprapubic/scrotal edema - expect gradually improvement Follow O2 requirement - discharging on 2 L with activity Needs follow up with urology for his kidney stone Follow electrolytes outpatient   Discharge Diagnoses:  Principal Problem:   Incarcerated inguinal hernia Active Problems:   OBSTRUCTIVE SLEEP APNEA   Atrial fibrillation, permanent (HCC)   COPD (chronic obstructive pulmonary disease) (HCC)   Discharge Condition: stable  Diet recommendation: heart healthy  Filed Weights   12/24/24 1229 12/24/24 1759  Weight: 68.9 kg 68.9 kg    History of present illness:   Maurice Wood is Maurice Wood 89 y.o. male with medical history of Maurice Wood fib, COPD, nephrolithiasis, Chronic pain who is sent from the urology office for an inguinal hernia that was difficult to reduce. On 1/12 shock wave lithotripsy was going to be performed on an 11 mm UPJ stone in left renal pelvis, but he was noted to be hypoxic and the procedure was aborted.  He was then discharged home. He subsequently began to have vomiting and developed pain in right lower abdomen. He continued to have this pain and vomiting or dry heaving for days. On the day of admission, he followed up with urology, underwent Maurice Wood CT scan, was found to have an incarcerated R inguinal hernia and was recommended to come to the ED.   He's now s/p R inguinal hernia repair with mesh 1/15 and diagnostic laparoscopy.    Stable for discharge on 1/22.  See below for additional details.  Hospital Course:  Assessment and Plan:  Incarcerated Inguinal hernia Plain films 1/15 with mid to distal SBO without free  intraperitoneal gas S/p diagnostic laparoscopy and R inguinal hernia repair with vicryl mesh 1/15 NGT out 1/17 Tolerating regular diet, stable for PO intake Stable for discharge from surgery standpoint   Suprapubic Edema  Scrotal Edema Related to above, reviewed with general surgery prior to surgery   Oliguria  Difficulty Voiding 650 cc out yesterday.  We placed cath on 1/21 due to concern for retention, but thankfully, there was no retention.   Will d/c cath today and do TOV. Continue flomax .   Encouraged continued good PO intake at home - his renal function is reassuringly stable.    COPD Oxygen  Requirement CXR 1/21 with enlargement of cardiopericardial silhouette with pulmonary vascular congestion, tiny bilateral effusions, retrocardiac L base atelectasis or infiltrate Requiring 2 L with activity, follow outpatient with PCP for long term O2 needs  HFpEF Echo 03/2024 with preserved EF At home takes lasix  once Maurice Wood week Appears euvolemic at this time   AKI  Creatinine peaked at 1.3 resolved   Permanent Atrial Fibrillation Eliquis     Hypokalemia Hypomagnesemia Hypophosphatemia Replace, follow outpatient  Delirium Post anesthesia  resolved Delirium precautions     Procedures:  1/15 Procedure: 1.  Right inguinal hernia repair with Vicryl mesh 2.  Diagnostic laparoscopy  Consultations: General surgery  Discharge Exam: Vitals:   12/31/24 0943 12/31/24 1323  BP:  106/61  Pulse:  88  Resp:    Temp:  97.8 F (36.6 C)  SpO2: 96% 100%   Eager to discharge Wife at bedside  General: No acute distress. Cardiovascular: RRR Lungs: unlabored, CTAB Abdomen: Soft, nontender,  nondistended  Continued scrotal edema and suprapubic edema Neurological: Alert and oriented 3. Moves all extremities 4 with equal strength. Cranial nerves II through XII grossly intact. Extremities: No clubbing or cyanosis. No edema.  Discharge Instructions   Discharge Instructions      Call MD for:  difficulty breathing, headache or visual disturbances   Complete by: As directed    Call MD for:  extreme fatigue   Complete by: As directed    Call MD for:  hives   Complete by: As directed    Call MD for:  persistant dizziness or light-headedness   Complete by: As directed    Call MD for:  persistant nausea and vomiting   Complete by: As directed    Call MD for:  redness, tenderness, or signs of infection (pain, swelling, redness, odor or green/yellow discharge around incision site)   Complete by: As directed    Call MD for:  severe uncontrolled pain   Complete by: As directed    Call MD for:  temperature >100.4   Complete by: As directed    Discharge instructions   Complete by: As directed    You were seen for an incarcerated inguinal hernia.    You've now had surgery with the general surgeons.  You'll discharge with supplemental oxygen  to use with activity.  At rest, you don't need oxygen , use 2 L when you're up and about or when sleeping.   I started you on flomax  due to the difficulty you had urinating.  Continue this as an outpatient.  Follow with urology as an outpatient for your renal stones.    Return for new, recurrent, or worsening symptoms.  Please ask your PCP to request records from this hospitalization so they know what was done and what the next steps will be.   Discharge wound care:   Complete by: As directed    Per general surgery - follow up with surgery as recommended   Increase activity slowly   Complete by: As directed       Allergies as of 12/31/2024       Reactions   Epinephrine Palpitations, Other (See Comments)   Increased Heart Rate, also   Nsaids Other (See Comments)   NOT WHILE TAKING ELIQUIS    Clarithromycin Other (See Comments)   Headaches   Iodinated Contrast Media Other (See Comments)   Pt states turned bright red   Nimodipine    Flushing   Pneumococcal Vaccines Hives, Rash        Medication List     STOP  taking these medications    omeprazole  20 MG capsule Commonly known as: PRILOSEC       TAKE these medications    albuterol  108 (90 Base) MCG/ACT inhaler Commonly known as: VENTOLIN  HFA Inhale 2 puffs into the lungs every 6 (six) hours as needed for wheezing or shortness of breath.   cholecalciferol 25 MCG (1000 UNIT) tablet Commonly known as: VITAMIN D3 Take 1,000 Units by mouth daily.   Eliquis  5 MG Tabs tablet Generic drug: apixaban  TAKE 1 TABLET BY MOUTH TWICE Kenyata Napier DAY   fluticasone  50 MCG/ACT nasal spray Commonly known as: FLONASE  Place 1 spray into both nostrils 2 (two) times daily as needed for rhinitis or allergies.   furosemide  40 MG tablet Commonly known as: LASIX  Take 1 tablet (40 mg total) by mouth once Averly Ericson week. ONLY ON MONDAY'S What changed:  when to take this additional instructions   HYDROcodone -acetaminophen  5-325 MG tablet Commonly known  as: NORCO/VICODIN Take 1 tablet by mouth every 4 (four) hours as needed. What changed: reasons to take this   nitroGLYCERIN  0.4 MG SL tablet Commonly known as: NITROSTAT  Place 1 tablet (0.4 mg total) under the tongue every 5 (five) minutes as needed for chest pain.   ondansetron  4 MG disintegrating tablet Commonly known as: ZOFRAN -ODT Take 1 tablet (4 mg total) by mouth every 8 (eight) hours as needed for nausea or vomiting. What changed: reasons to take this   phosphorus 155-852-130 MG tablet Commonly known as: K PHOS  NEUTRAL Take 2 tablets (500 mg total) by mouth 2 (two) times daily for 2 doses. Start taking on: January 01, 2025   potassium chloride  SA 20 MEQ tablet Commonly known as: KLOR-CON  M Take 2 tablets (40 mEq total) by mouth 2 (two) times Fiorella Hanahan week. Every Mon and Fri What changed:  when to take this additional instructions   PriLOSEC OTC 20 MG tablet Generic drug: omeprazole  Take 20 mg by mouth daily as needed (for acid reflux).   tamsulosin  0.4 MG Caps capsule Commonly known as: FLOMAX  Take 1  capsule (0.4 mg total) by mouth daily.   Trelegy Ellipta  100-62.5-25 MCG/ACT Aepb Generic drug: Fluticasone -Umeclidin-Vilant INHALE 1 PUFF INTO THE LUNGS DAILY IN THE AFTERNOON   TYLENOL  500 MG tablet Generic drug: acetaminophen  Take 500 mg by mouth every 6 (six) hours as needed for mild pain (pain score 1-3) (or headaches).               Durable Medical Equipment  (From admission, onward)           Start     Ordered   12/31/24 1333  DME Oxygen   Once       Comments: SATURATION QUALIFICATIONS: (This note is used to comply with regulatory documentation for home oxygen )   Patient Saturations on Room Air at Rest = 97%   Patient Saturations on Room Air while Ambulating = 86%   Patient Saturations on 2 Liters of oxygen  while Ambulating = 94%   Please briefly explain why patient needs home oxygen : Pt O2 drops below 88% on room air while ambulating  Question Answer Comment  Length of Need 12 Months   Mode or (Route) Nasal cannula   Liters per Minute 2   Frequency Continuous (stationary and portable oxygen  unit needed)   Oxygen  conserving device Yes   Oxygen  delivery system: Gas      12/31/24 1334   12/29/24 1425  For home use only DME oxygen   Once       Question Answer Comment  Length of Need 6 Months   Mode or (Route) Nasal cannula   Liters per Minute 2   Frequency Continuous (stationary and portable oxygen  unit needed)   Oxygen  conserving device Yes   Oxygen  delivery system: Gas   Oxygen  delivery system: Portable concentrator (POC)      12/29/24 1424              Discharge Care Instructions  (From admission, onward)           Start     Ordered   12/31/24 0000  Discharge wound care:       Comments: Per general surgery - follow up with surgery as recommended   12/31/24 1334           Allergies[1]  Contact information for follow-up providers     Maczis, Puja Gosai, PA-C. Go on 01/19/2025.   Specialty: General Surgery Why: Your postop  follow-up  visit is Tuesday 01/19/25. Please arrive at 11 AM to fill out paperwork for your appointment at 11:45am.  If you need to reschedule, please call us  as soon as he can. Contact information: [address] [facility] [place] [address] [phone]         Surgery, [place]. Go on 01/05/2025.   Specialty: General Surgery Why: Your appointment for staple removal is Tuesday on 01/05/25. Please arrive at 8:45am for your 9:00am appointment.  If you need to reschedule this, please call us  as soon as you can. Contact information: [address] [place] [address] [phone]              Contact information for after-discharge care     Home Medical Care     [facility]) .   Service: Home Health Services Contact information: [address] [place]  [address] [phone]                      The results of significant diagnostics from this hospitalization (including imaging, microbiology, ancillary and laboratory) are listed below for reference.    Significant Diagnostic Studies: DG Chest Port 1 View Result Date: 12/30/2024 CLINICAL DATA:  Hypoxia. EXAM: PORTABLE CHEST 1 VIEW COMPARISON:  10/02/2024 FINDINGS: The cardio pericardial silhouette is enlarged. There is pulmonary vascular congestion without overt pulmonary edema. Retrocardiac left base atelectasis or infiltrate evident. Blunting of the costophrenic angles bilaterally suggest the presence of tiny effusions. No acute bony abnormality. Degenerative changes noted left shoulder. Telemetry leads overlie the chest. IMPRESSION: 1. Enlargement of the cardiopericardial silhouette with pulmonary vascular congestion. 2. Retrocardiac left base atelectasis or infiltrate. 3. Probable tiny bilateral pleural effusions. Electronically Signed   By: Camellia Candle M.D.   On: 12/30/2024 07:43   DG Abd Portable 1 View Result  Date: 12/24/2024 EXAM: 1 VIEW XRAY OF THE ABDOMEN 12/24/2024 05:57:00 PM COMPARISON: 12/21/2024 CLINICAL HISTORY: NGT placement NGT placement NGT placement NGT placement NGT placement FINDINGS: LINES, TUBES AND DEVICES: Enteric tube in place with tip and side port projecting over the stomach. Nerve stimulator generator in left lower quadrant. BOWEL: Multiple dilated small bowel loops in abdomen in keeping with Alfred Harrel mid to distal small bowel obstruction. No free intraperitoneal gas. SOFT TISSUES: Surgical clips in right upper quadrant. No abnormal calcifications. BONES: No acute fracture. PLEURAL SPACES: Small left pleural effusion. LUNGS: Bibasilar atelectasis. IMPRESSION: 1. Enteric tube tip and side port project over the stomach. 2. Mid to distal small bowel obstruction without free intraperitoneal gas. 3. Small left pleural effusion. Electronically signed by: Dorethia Molt MD 12/24/2024 06:02 PM EST RP Workstation: [id number]   DG Abd 1 View Result Date: 12/21/2024 CLINICAL DATA:  Left renal stone. EXAM: DG ABDOMEN 1V COMPARISON:  CT dated 10/18/2024. FINDINGS: An 11 mm radiopaque focus over the left sacral ala corresponds to the stone seen in the left renal pelvis on the prior CT. There is no bowel dilatation or evidence of obstruction. Right upper quadrant cholecystectomy clips. Degenerative changes of the spine. No acute osseous pathology. IMPRESSION: An 11 mm left renal pelvis stone. Electronically Signed   By: Vanetta Chou M.D.   On: 12/21/2024 18:47    Microbiology: Recent Results (from the past 240 hours)  MRSA Next Gen by PCR, Nasal     Status: None   Collection Time: 12/25/24 11:00 AM   Specimen: Nasal Mucosa; Nasal Swab  Result Value  Ref Range Status   MRSA by PCR Next Gen NOT DETECTED NOT DETECTED Final    Comment: (NOTE) The GeneXpert MRSA Assay (FDA approved for NASAL specimens only), is one component of Juvenal Umar comprehensive MRSA colonization surveillance program. It is not intended to  diagnose MRSA infection nor to guide or monitor treatment for MRSA infections. Test performance is not FDA approved in patients less than 8 years old. Performed at [facility], [address] [place], [place] [address]      Labs: Basic Metabolic Panel: Recent Labs  Lab 12/25/24 0602 12/26/24 0520 12/29/24 0038 12/30/24 0352 12/31/24 0851  NA 138 141 138 139 138  K 4.6 4.6 3.8 3.7 3.2*  CL 105 108 109 109 107  CO2 21* 21* 21* 20* 21*  GLUCOSE 109* 86 130* 105* 163*  BUN 18 16 9 9 13   CREATININE 1.32* 1.10 0.72 0.75 0.77  CALCIUM  8.9 9.2 9.0 8.7* 8.6*  MG  --   --  2.1  --  1.6*  PHOS  --   --   --   --  1.7*   Liver Function Tests: Recent Labs  Lab 12/25/24 0602 12/31/24 0851  AST 20 18  ALT 8 5  ALKPHOS 70 74  BILITOT 1.4* 0.6  PROT 5.5* 5.4*  ALBUMIN 3.1* 2.9*   No results for input(s): LIPASE, AMYLASE in the last 168 hours. No results for input(s): AMMONIA in the last 168 hours. CBC: Recent Labs  Lab 12/25/24 0602 12/26/24 0520 12/29/24 0038 12/30/24 0352 12/31/24 0851  WBC 10.6* 8.9 6.7 4.6 6.7  NEUTROABS  --   --   --   --  4.9  HGB 12.4* 12.1* 11.7* 11.3* 11.7*  HCT 40.0 38.8* 36.4* 37.3* 36.4*  MCV 93.2 93.7 90.5 93.0 90.3  PLT 168 161 170 188 213   Cardiac Enzymes: No results for input(s): CKTOTAL, CKMB, CKMBINDEX, TROPONINI in the last 168 hours. BNP: BNP (last 3 results) Recent Labs    03/21/24 1740 03/23/24 0333  BNP 431.9* 187.3*    ProBNP (last 3 results) No results for input(s): PROBNP in the last 8760 hours.  CBG: Recent Labs  Lab 12/28/24 0815  GLUCAP 106*       Signed:  Meliton Monte MD.  [facility] 12/31/2024, 1:34 PM       [1]  Allergies Allergen Reactions   Epinephrine Palpitations and Other (See Comments)    Increased Heart Rate, also   Nsaids Other (See Comments)    NOT WHILE TAKING ELIQUIS     Clarithromycin Other (See Comments)    Headaches     Iodinated Contrast Media Other (See Comments)    Pt states turned bright red   Nimodipine     Flushing   Pneumococcal Vaccines Hives and Rash

## 2024-12-31 NOTE — Progress Notes (Signed)
 SATURATION QUALIFICATIONS: (This note is used to comply with regulatory documentation for home oxygen )  Patient Saturations on Room Air at Rest = 97%  Patient Saturations on Room Air while Ambulating = 86%  Patient Saturations on 2 Liters of oxygen  while Ambulating = 94%  Please briefly explain why patient needs home oxygen : Pt O2 drops below 88% on room air while ambulating

## 2025-01-08 ENCOUNTER — Emergency Department (HOSPITAL_COMMUNITY)

## 2025-01-08 ENCOUNTER — Inpatient Hospital Stay (HOSPITAL_COMMUNITY): Admission: EM | Admit: 2025-01-08 | Source: Home / Self Care | Attending: Family Medicine | Admitting: Family Medicine

## 2025-01-08 DIAGNOSIS — J9621 Acute and chronic respiratory failure with hypoxia: Secondary | ICD-10-CM | POA: Diagnosis present

## 2025-01-08 DIAGNOSIS — J9601 Acute respiratory failure with hypoxia: Secondary | ICD-10-CM

## 2025-01-08 DIAGNOSIS — Z7901 Long term (current) use of anticoagulants: Secondary | ICD-10-CM

## 2025-01-08 DIAGNOSIS — I5033 Acute on chronic diastolic (congestive) heart failure: Secondary | ICD-10-CM | POA: Diagnosis present

## 2025-01-08 DIAGNOSIS — I4811 Longstanding persistent atrial fibrillation: Secondary | ICD-10-CM

## 2025-01-08 DIAGNOSIS — I509 Heart failure, unspecified: Secondary | ICD-10-CM

## 2025-01-08 DIAGNOSIS — J441 Chronic obstructive pulmonary disease with (acute) exacerbation: Principal | ICD-10-CM

## 2025-01-08 HISTORY — DX: Chronic respiratory failure with hypoxia: J96.11

## 2025-01-08 HISTORY — DX: Nonrheumatic mitral (valve) insufficiency: I34.0

## 2025-01-08 HISTORY — DX: Permanent atrial fibrillation: I48.21

## 2025-01-08 HISTORY — DX: Thoracic aortic ectasia: I77.810

## 2025-01-08 HISTORY — DX: Atherosclerosis of aorta: I70.0

## 2025-01-08 HISTORY — DX: Cerebral infarction, unspecified: I63.9

## 2025-01-08 HISTORY — DX: Interstitial pulmonary disease, unspecified: J84.9

## 2025-01-08 HISTORY — DX: Chronic diastolic (congestive) heart failure: I50.32

## 2025-01-08 LAB — CBC WITH DIFFERENTIAL/PLATELET
Abs Immature Granulocytes: 0.04 10*3/uL (ref 0.00–0.07)
Basophils Absolute: 0 10*3/uL (ref 0.0–0.1)
Basophils Relative: 0 %
Eosinophils Absolute: 0 10*3/uL (ref 0.0–0.5)
Eosinophils Relative: 0 %
HCT: 35 % — ABNORMAL LOW (ref 39.0–52.0)
Hemoglobin: 11.1 g/dL — ABNORMAL LOW (ref 13.0–17.0)
Immature Granulocytes: 1 %
Lymphocytes Relative: 12 %
Lymphs Abs: 0.9 10*3/uL (ref 0.7–4.0)
MCH: 29 pg (ref 26.0–34.0)
MCHC: 31.7 g/dL (ref 30.0–36.0)
MCV: 91.4 fL (ref 80.0–100.0)
Monocytes Absolute: 0.3 10*3/uL (ref 0.1–1.0)
Monocytes Relative: 4 %
Neutro Abs: 6.1 10*3/uL (ref 1.7–7.7)
Neutrophils Relative %: 83 %
Platelets: 233 10*3/uL (ref 150–400)
RBC: 3.83 MIL/uL — ABNORMAL LOW (ref 4.22–5.81)
RDW: 14.6 % (ref 11.5–15.5)
WBC: 7.4 10*3/uL (ref 4.0–10.5)
nRBC: 0 % (ref 0.0–0.2)

## 2025-01-08 LAB — D-DIMER, QUANTITATIVE: D-Dimer, Quant: 1.34 ug{FEU}/mL — ABNORMAL HIGH (ref 0.00–0.50)

## 2025-01-08 LAB — PRO BRAIN NATRIURETIC PEPTIDE: Pro Brain Natriuretic Peptide: 3199 pg/mL — ABNORMAL HIGH

## 2025-01-08 LAB — BASIC METABOLIC PANEL WITH GFR
Anion gap: 10 (ref 5–15)
BUN: 12 mg/dL (ref 8–23)
CO2: 24 mmol/L (ref 22–32)
Calcium: 8.6 mg/dL — ABNORMAL LOW (ref 8.9–10.3)
Chloride: 105 mmol/L (ref 98–111)
Creatinine, Ser: 0.85 mg/dL (ref 0.61–1.24)
GFR, Estimated: >60 mL/min
Glucose, Bld: 155 mg/dL — ABNORMAL HIGH (ref 70–99)
Potassium: 4 mmol/L (ref 3.5–5.1)
Sodium: 139 mmol/L (ref 135–145)

## 2025-01-08 LAB — RESP PANEL BY RT-PCR (RSV, FLU A&B, COVID)  RVPGX2
Influenza A by PCR: NEGATIVE
Influenza B by PCR: NEGATIVE
Resp Syncytial Virus by PCR: NEGATIVE
SARS Coronavirus 2 by RT PCR: NEGATIVE

## 2025-01-08 LAB — TROPONIN T, HIGH SENSITIVITY
Troponin T High Sensitivity: 44 ng/L — ABNORMAL HIGH (ref 0–19)
Troponin T High Sensitivity: 40 ng/L — ABNORMAL HIGH (ref 0–19)

## 2025-01-08 MED ORDER — TAMSULOSIN HCL 0.4 MG PO CAPS
0.4000 mg | ORAL_CAPSULE | Freq: Every day | ORAL | Status: AC
  Administered 2025-01-08 – 2025-01-15 (×8): 0.4 mg via ORAL
  Filled 2025-01-08 (×8): qty 1

## 2025-01-08 MED ORDER — ALBUTEROL SULFATE (2.5 MG/3ML) 0.083% IN NEBU
2.5000 mg | INHALATION_SOLUTION | Freq: Once | RESPIRATORY_TRACT | Status: AC
  Administered 2025-01-08: 2.5 mg via RESPIRATORY_TRACT
  Filled 2025-01-08: qty 3

## 2025-01-08 MED ORDER — APIXABAN 5 MG PO TABS
5.0000 mg | ORAL_TABLET | Freq: Two times a day (BID) | ORAL | Status: AC
  Administered 2025-01-08 – 2025-01-15 (×15): 5 mg via ORAL
  Filled 2025-01-08 (×15): qty 1

## 2025-01-08 MED ORDER — BUDESON-GLYCOPYRROL-FORMOTEROL 160-9-4.8 MCG/ACT IN AERO
2.0000 | INHALATION_SPRAY | Freq: Two times a day (BID) | RESPIRATORY_TRACT | Status: AC
  Administered 2025-01-08 – 2025-01-15 (×15): 2 via RESPIRATORY_TRACT
  Filled 2025-01-08: qty 5.9

## 2025-01-08 MED ORDER — METOCLOPRAMIDE HCL 5 MG/ML IJ SOLN
10.0000 mg | Freq: Four times a day (QID) | INTRAMUSCULAR | Status: AC | PRN
  Administered 2025-01-11 – 2025-01-13 (×2): 10 mg via INTRAVENOUS
  Filled 2025-01-08 (×2): qty 2

## 2025-01-08 MED ORDER — FUROSEMIDE 10 MG/ML IJ SOLN
40.0000 mg | Freq: Once | INTRAMUSCULAR | Status: AC
  Administered 2025-01-09: 40 mg via INTRAVENOUS
  Filled 2025-01-08: qty 4

## 2025-01-08 MED ORDER — ACETAMINOPHEN 325 MG PO TABS
650.0000 mg | ORAL_TABLET | Freq: Four times a day (QID) | ORAL | Status: AC | PRN
  Administered 2025-01-09 – 2025-01-14 (×6): 650 mg via ORAL
  Filled 2025-01-08 (×6): qty 2

## 2025-01-08 MED ORDER — ACETAMINOPHEN 650 MG RE SUPP: 650.0000 mg | Freq: Four times a day (QID) | RECTAL | Status: AC | PRN

## 2025-01-08 MED ORDER — FUROSEMIDE 10 MG/ML IJ SOLN
40.0000 mg | Freq: Once | INTRAMUSCULAR | Status: AC
  Administered 2025-01-08: 40 mg via INTRAVENOUS
  Filled 2025-01-08: qty 4

## 2025-01-08 MED ORDER — METHYLPREDNISOLONE SODIUM SUCC 125 MG IJ SOLR
125.0000 mg | Freq: Once | INTRAMUSCULAR | Status: AC
  Administered 2025-01-08: 125 mg via INTRAVENOUS
  Filled 2025-01-08: qty 2

## 2025-01-08 MED ORDER — TRAZODONE HCL 50 MG PO TABS: 25.0000 mg | ORAL_TABLET | Freq: Every evening | ORAL | Status: AC | PRN

## 2025-01-08 NOTE — H&P (Signed)
 " History and Physical  Maurice Wood FMW:[id number] DOB: 10/02/1933 DOA: 01/08/2025  PCP: Seabron Alm, MD   Chief Complaint: Shortness of breath, leg swelling  HPI: Maurice Wood is a 89 y.o. male with medical history significant for permanent A-fib on Eliquis , chronic hypoxic respiratory failure on 2 L due to COPD, heart failure with preserved EF who is being admitted to the hospital with acute on chronic hypoxic respiratory failure due to acute on chronic heart failure with preserved EF.  History is provided by the patient as well as his wife and son who are at the bedside, they state that over the last few days he has had increasing lower extremity edema, orthopnea.  He denies any wheezing, cough or dyspnea with exertion.  He is followed by heart failure team, he is on 40 mg p.o. Lasix  once weekly, has been compliant with this.  He was just discharged from the hospital on 1/22 after stay for incarcerated inguinal hernia repair.  Home health nurse came to visit him today, noticed that he was saturating in the low 80s and she noted that his home oxygen  was not working.  On evaluation here in the emergency department, he was noted to be wheezing initially, was given IV Solu-Medrol , breathing treatment, as well as IV Lasix  due to evidence of heart failure.  Currently the patient states he has urinated quite a bit, and is feeling a little bit better.  Review of Systems: Please see HPI for pertinent positives and negatives. A complete 10 system review of systems are otherwise negative.  Past Medical History:  Diagnosis Date   Allergy    rhinitis   Atrial fibrillation [facility]) Jan 2007   echo 1-07 normal ejection fraction, no significant valvular disease. adenosine  cardiolite  1-07  no evidence of ischemia. A 48 hour Holter monitor in 7-07 showed good rate controlw/ chronic atrial fibrillation. Cath 1/11 normal cors. EF 45%. Echo 2-11 60-65%   Benign prostatic hypertrophy    Cerebral aneurysm    followed  by Dr Dolphus   CHF (congestive heart failure) (HCC)    COPD (chronic obstructive pulmonary disease) (HCC)    Fatigue    History of chest pain    a. s/p LHC 05/2014 with normal cors   Hx of colonic polyps    diverticulosis   Hyperlipidemia    IBS (irritable bowel syndrome)    Incontinence    Per pt 12/14/11   Kidney stones    Obesity    Obstructive sleep apnea    noncompliant with CPAP   Retinal vein occlusion (HCC)    Past Surgical History:  Procedure Laterality Date   CARDIAC CATHETERIZATION  1999   CHOLECYSTECTOMY     HERNIA REPAIR     inguinal and umbilical herniorrhaphy     INGUINAL HERNIA REPAIR Right 12/24/2024   Procedure: REPAIR, HERNIA, INGUINAL, ADULT WITH MESH, DIAGNOSTIC LAPAROSCOPY;  Surgeon: Ebbie Cough, MD;  Location: [facility] ORS;  Service: General;  Laterality: Right;   INTERSTIM IMPLANT PLACEMENT  04/2017   LEFT HEART CATHETERIZATION WITH CORONARY ANGIOGRAM N/A 06/01/2014   Procedure: LEFT HEART CATHETERIZATION WITH CORONARY ANGIOGRAM;  Surgeon: Peter M Jordan, MD;  Location: [facility] CATH LAB;  Service: Cardiovascular;  Laterality: N/A;   LITHOTRIPSY     RIGHT/LEFT HEART CATH AND CORONARY ANGIOGRAPHY N/A 07/08/2020   Procedure: RIGHT/LEFT HEART CATH AND CORONARY ANGIOGRAPHY;  Surgeon: Cherrie Toribio SAUNDERS, MD;  Location: [facility] INVASIVE CV LAB;  Service: Cardiovascular;  Laterality: N/A;   TEE WITHOUT  CARDIOVERSION N/A 07/05/2020   Procedure: TRANSESOPHAGEAL ECHOCARDIOGRAM (TEE);  Surgeon: Cherrie Toribio SAUNDERS, MD;  Location: [facility] ENDOSCOPY;  Service: Cardiovascular;  Laterality: N/A;   TEE WITHOUT CARDIOVERSION N/A 05/21/2023   Procedure: TRANSESOPHAGEAL ECHOCARDIOGRAM;  Surgeon: Cherrie Toribio SAUNDERS, MD;  Location: [facility] INVASIVE CV LAB;  Service: Cardiovascular;  Laterality: N/A;   TONSILLECTOMY AND ADENOIDECTOMY  as a child   Social History:  reports that he quit smoking about 53 years ago. His smoking use included cigarettes. He started smoking about 73 years ago. He has a 20  pack-year smoking history. He has never used smokeless tobacco. He reports current alcohol use of about 1.0 standard drink of alcohol per week. He reports that he does not use drugs.  Allergies[1]  Family History  Problem Relation Age of Onset   Cancer Father        lung   Alcohol abuse Father    Aneurysm Father        Aortic aneurysm   Cancer Maternal Grandmother        colon   Sudden death Mother    Heart disease Paternal Grandfather    Diabetes Son    Diabetes Maternal Uncle    Diabetes Paternal Uncle      Prior to Admission medications  Medication Sig Start Date End Date Taking? Authorizing Provider  albuterol  (VENTOLIN  HFA) 108 (90 Base) MCG/ACT inhaler Inhale 2 puffs into the lungs every 6 (six) hours as needed for wheezing or shortness of breath. 09/17/23  Yes Cobb, Comer GAILS, NP  cholecalciferol (VITAMIN D3) 25 MCG (1000 UNIT) tablet Take 1,000 Units by mouth daily.   Yes [provider]  ELIQUIS  5 MG TABS tablet TAKE 1 TABLET BY MOUTH TWICE A DAY 10/29/24  Yes [name], Toribio SAUNDERS, MD  fluticasone  (FLONASE ) 50 MCG/ACT nasal spray Place 1 spray into both nostrils 2 (two) times daily as needed for rhinitis or allergies.   Yes [provider]  Fluticasone -Umeclidin-Vilant (TRELEGY ELLIPTA ) 100-62.5-25 MCG/ACT AEPB INHALE 1 PUFF INTO THE LUNGS DAILY IN THE AFTERNOON 08/28/23  Yes Sood, Vineet, MD  furosemide  (LASIX ) 40 MG tablet Take 1 tablet (40 mg total) by mouth once a week. ONLY ON MONDAY'S Patient taking differently: Take 40 mg by mouth every Monday. 10/22/24  Yes [name], Toribio SAUNDERS, MD  HYDROcodone -acetaminophen  (NORCO/VICODIN) 5-325 MG tablet Take 1 tablet by mouth every 4 (four) hours as needed. Patient taking differently: Take 1 tablet by mouth every 4 (four) hours as needed for severe pain (pain score 7-10). 10/18/24  Yes Dean Clarity, MD  nitroGLYCERIN  (NITROSTAT ) 0.4 MG SL tablet Place 1 tablet (0.4 mg total) under the tongue every 5 (five) minutes  as needed for chest pain. 06/12/23 10/22/25 Yes Milford, Harlene HERO, FNP  ondansetron  (ZOFRAN -ODT) 4 MG disintegrating tablet Take 1 tablet (4 mg total) by mouth every 8 (eight) hours as needed for nausea or vomiting. Patient taking differently: Take 4 mg by mouth every 8 (eight) hours as needed for nausea or vomiting (dissolve orally). 10/18/24  Yes Dean Clarity, MD  potassium chloride  SA (KLOR-CON  M) 20 MEQ tablet Take 2 tablets (40 mEq total) by mouth 2 (two) times a week. Every Mon and Fri Patient taking differently: Take 40 mEq by mouth See admin instructions. Take 40 mEq by mouth on Mondays and Fridays 06/01/24  Yes [name], Toribio SAUNDERS, MD  PRILOSEC OTC 20 MG tablet Take 20 mg by mouth daily as needed (for acid reflux).   Yes [provider]  tamsulosin  (FLOMAX )  0.4 MG CAPS capsule Take 1 capsule (0.4 mg total) by mouth daily. 12/31/24 01/30/25 Yes Perri DELENA Meliton Mickey., MD  TYLENOL  500 MG tablet Take 500 mg by mouth every 6 (six) hours as needed for mild pain (pain score 1-3) (or headaches).   Yes [provider]    Physical Exam: BP 134/85   Pulse 84   Temp 98.1 F (36.7 C)   Resp (!) 23   SpO2 95%  General:  Alert, oriented, calm, in no acute distress, mildly tachypneic with speaking in full sentences.  His wife and son are at the bedside. Cardiovascular: Irregularly irregular, no murmurs or rubs, he has 3+ bilateral lower extremity pitting edema up to the bilateral knees Respiratory: clear to auscultation bilaterally, no wheezes, but with bibasilar crackles, gets a little tachypneic after speaking a full sentence Abdomen: soft, nontender, nondistended, well-healing surgical incisions Skin: dry, no rashes  Musculoskeletal: no joint effusions, normal range of motion  Psychiatric: appropriate affect, normal speech  Neurologic: extraocular muscles intact, clear speech, moving all extremities with intact sensorium         Labs on Admission:  Basic Metabolic  Panel: Recent Labs  Lab 01/08/25 1236  NA 139  K 4.0  CL 105  CO2 24  GLUCOSE 155*  BUN 12  CREATININE 0.85  CALCIUM  8.6*   Liver Function Tests: No results for input(s): AST, ALT, ALKPHOS, BILITOT, PROT, ALBUMIN in the last 168 hours. No results for input(s): LIPASE, AMYLASE in the last 168 hours. No results for input(s): AMMONIA in the last 168 hours. CBC: Recent Labs  Lab 01/08/25 1236  WBC 7.4  NEUTROABS 6.1  HGB 11.1*  HCT 35.0*  MCV 91.4  PLT 233   Cardiac Enzymes: No results for input(s): CKTOTAL, CKMB, CKMBINDEX, TROPONINI in the last 168 hours. BNP (last 3 results) Recent Labs    03/21/24 1740 03/23/24 0333  BNP 431.9* 187.3*    ProBNP (last 3 results) Recent Labs    01/08/25 1236  PROBNP 3,199.0*    CBG: No results for input(s): GLUCAP in the last 168 hours.  Radiological Exams on Admission: DG Hand Complete Right Result Date: 01/08/2025 CLINICAL DATA:  Fall. EXAM: RIGHT HAND - COMPLETE 3+ VIEW COMPARISON:  None Available. FINDINGS: Negative for an acute fracture or dislocation in the right hand. No focal soft tissue abnormality. Right hand alignment is within normal limits. No gross abnormality to the right wrist. IMPRESSION: No acute bone abnormality to the right hand. Electronically Signed   By: Juliene Balder M.D.   On: 01/08/2025 14:17   DG Chest Port 1 View Result Date: 01/08/2025 CLINICAL DATA:  Shortness of breath. EXAM: PORTABLE CHEST 1 VIEW COMPARISON:  12/30/2024 FINDINGS: Cardiomegaly with markedly increased interstitial and airspace densities in both lungs, right side greater than left. The pattern of disease is suggestive for asymmetric pulmonary edema. Trachea is midline. Negative for a pneumothorax. No acute bone abnormality. IMPRESSION: Cardiomegaly with asymmetric pulmonary edema. Findings are suggestive for congestive heart failure. Electronically Signed   By: Juliene Balder M.D.   On: 01/08/2025 14:15    Assessment/Plan Jatavion Peaster Jean is a 89 y.o. male with medical history significant for permanent A-fib on Eliquis , chronic hypoxic respiratory failure on 2 L due to COPD, heart failure with preserved EF who is being admitted to the hospital with acute on chronic hypoxic respiratory failure due to acute on chronic heart failure with preserved EF.   Acute on chronic hypoxic respiratory failure-I suspect this  is mainly due to exacerbation of his heart failure, as he denies any recent cough or wheezing.  However nursing notes that he initially was wheezing when seen by his home health nurse. -Inpatient admission -Treat heart failure and COPD as below -Continue supplemental oxygen , wean back to baseline oxygen  as tolerated  Acute on chronic heart failure with preserved EF-followed by [facility] health cardiology heart failure team, at baseline he is on 40 mg Lasix  once weekly and states he has been compliant with this.  Heart failure may be exacerbated by his recent hospitalization and surgery, though he was noted to be euvolemic at the time of discharge last week.  Last echo 03/22/2024 with preserved EF and diastolic function not calculated. -Heart healthy diet with fluid restriction -Received IV Lasix  in the emergency department, will order another dose for tomorrow morning, with further dosing per clinical response  COPD-on chronic 2 L nasal cannula oxygen , now on more oxygen  as above.  Given no report of wheezing, significant cough or dyspnea, I doubt that he has an ongoing acute exacerbation of his COPD.  Received a dose of IV Solu-Medrol  in the emergency department. -Breztri  twice daily -Continue supplemental oxygen  as above -Albuterol  nebulizer as needed  OSA-CPAP nightly  Permanent atrial fibrillation-rate is well-controlled  -Continue Eliquis   BPH-Flomax   DVT prophylaxis: Continue Eliquis     Code Status: Full Code  Consults called: None  Admission status: The appropriate patient status  for this patient is INPATIENT. Inpatient status is judged to be reasonable and necessary in order to provide the required intensity of service to ensure the patient's safety. The patient's presenting symptoms, physical exam findings, and initial radiographic and laboratory data in the context of their chronic comorbidities is felt to place them at high risk for further clinical deterioration. Furthermore, it is not anticipated that the patient will be medically stable for discharge from the hospital within 2 midnights of admission.    I certify that at the point of admission it is my clinical judgment that the patient will require inpatient hospital care spanning beyond 2 midnights from the point of admission due to high intensity of service, high risk for further deterioration and high frequency of surveillance required  Time spent: 53 minutes  Dail Lerew CHRISTELLA Gail MD [facility] Pager [phone]  If 7PM-7AM, please contact night-coverage [email or web address] Password [email or web address]  01/08/2025, 4:40 PM      [1]  Allergies Allergen Reactions   Epinephrine Palpitations and Other (See Comments)    Increased Heart Rate, also   Nsaids Other (See Comments)    NOT WHILE TAKING ELIQUIS     Clarithromycin Other (See Comments)    Headaches    Iodinated Contrast Media Other (See Comments)    Pt states turned bright red   Nimodipine Other (See Comments)    Nimodipine, sold under the brand name Nimotop among others, is a calcium  channel blocker used in preventing vasospasm... = Flushing   Pneumococcal Vaccines Hives and Rash   "

## 2025-01-08 NOTE — ED Provider Notes (Signed)
 " [place] EMERGENCY DEPARTMENT AT [facility] Provider Note   CSN: [id number] Arrival date & time: 01/08/25  1124     Patient presents with: Shortness of Breath   Maurice Wood is a 89 y.o. male.   Pt is a 89 yo male with pmhx significant for afib (on Eliquis ), sleep apnea, hld, IBS, chf, copd (normally on 2L).  Pt had an inguinal hernia repair on 1/15 by Dr. Ebbie.  He had his staples removed yesterday.  On the way back into his house, he fell.  He is not sure what happened.  He sustained some skin tears to his right hand and has a little bit of rib pain.  He did not hit his head or have a loc.  Home health came out to his house today and noted that his O2 sats were I the low 80s on his usual 2L.  The [facility] nurse gave him 2 duonebs and increased his O2 to 4L.  O2 in the low 90s on 4L.  Pt is breathing better.  His wife has been ill.  Pt's family said he's supposed to take his lasix  and weigh himself daily, but he does not because he does not like having to urinate all the time.       Prior to Admission medications  Medication Sig Start Date End Date Taking? Authorizing Provider  albuterol  (VENTOLIN  HFA) 108 (90 Base) MCG/ACT inhaler Inhale 2 puffs into the lungs every 6 (six) hours as needed for wheezing or shortness of breath. 09/17/23  Yes Cobb, Comer GAILS, NP  cholecalciferol (VITAMIN D3) 25 MCG (1000 UNIT) tablet Take 1,000 Units by mouth daily.   Yes [provider]  ELIQUIS  5 MG TABS tablet TAKE 1 TABLET BY MOUTH TWICE A DAY 10/29/24  Yes [name], Toribio SAUNDERS, MD  fluticasone  (FLONASE ) 50 MCG/ACT nasal spray Place 1 spray into both nostrils 2 (two) times daily as needed for rhinitis or allergies.   Yes [provider]  Fluticasone -Umeclidin-Vilant (TRELEGY ELLIPTA ) 100-62.5-25 MCG/ACT AEPB INHALE 1 PUFF INTO THE LUNGS DAILY IN THE AFTERNOON 08/28/23  Yes Sood, Vineet, MD  furosemide  (LASIX ) 40 MG tablet Take 1 tablet (40 mg total) by mouth once a week.  ONLY ON MONDAY'S Patient taking differently: Take 40 mg by mouth every Monday. 10/22/24  Yes [name], Toribio SAUNDERS, MD  HYDROcodone -acetaminophen  (NORCO/VICODIN) 5-325 MG tablet Take 1 tablet by mouth every 4 (four) hours as needed. Patient taking differently: Take 1 tablet by mouth every 4 (four) hours as needed for severe pain (pain score 7-10). 10/18/24  Yes Dean Clarity, MD  nitroGLYCERIN  (NITROSTAT ) 0.4 MG SL tablet Place 1 tablet (0.4 mg total) under the tongue every 5 (five) minutes as needed for chest pain. 06/12/23 10/22/25 Yes Milford, Harlene HERO, FNP  ondansetron  (ZOFRAN -ODT) 4 MG disintegrating tablet Take 1 tablet (4 mg total) by mouth every 8 (eight) hours as needed for nausea or vomiting. Patient taking differently: Take 4 mg by mouth every 8 (eight) hours as needed for nausea or vomiting (dissolve orally). 10/18/24  Yes Dean Clarity, MD  potassium chloride  SA (KLOR-CON  M) 20 MEQ tablet Take 2 tablets (40 mEq total) by mouth 2 (two) times a week. Every Mon and Fri Patient taking differently: Take 40 mEq by mouth See admin instructions. Take 40 mEq by mouth on Mondays and Fridays 06/01/24  Yes [name], Toribio SAUNDERS, MD  PRILOSEC OTC 20 MG tablet Take 20 mg by mouth daily as needed (for acid reflux).  Yes [provider]  tamsulosin  (FLOMAX ) 0.4 MG CAPS capsule Take 1 capsule (0.4 mg total) by mouth daily. 12/31/24 01/30/25 Yes Perri DELENA Meliton Mickey., MD  TYLENOL  500 MG tablet Take 500 mg by mouth every 6 (six) hours as needed for mild pain (pain score 1-3) (or headaches).   Yes [provider]    Allergies: Epinephrine, Nsaids, Clarithromycin, Iodinated contrast media, Nimodipine, and Pneumococcal vaccines    Review of Systems  Respiratory:  Positive for shortness of breath.   Musculoskeletal:        R hand pain  Skin:  Positive for wound.  All other systems reviewed and are negative.   Updated Vital Signs BP 134/85   Pulse 84   Temp 98.1 F (36.7 C)   Resp (!)  23   SpO2 95%   Physical Exam Vitals and nursing note reviewed.  Constitutional:      Appearance: He is well-developed.  HENT:     Head: Normocephalic and atraumatic.     Mouth/Throat:     Mouth: Mucous membranes are moist.     Pharynx: Oropharynx is clear.  Eyes:     Extraocular Movements: Extraocular movements intact.     Pupils: Pupils are equal, round, and reactive to light.  Cardiovascular:     Rate and Rhythm: Normal rate. Rhythm irregular.  Pulmonary:     Effort: Pulmonary effort is normal.     Breath sounds: Wheezing and rhonchi present.  Chest:     Comments: Mild chest wall tenderness on left Abdominal:     General: Bowel sounds are normal.     Palpations: Abdomen is soft.  Musculoskeletal:        General: Normal range of motion.     Cervical back: Normal range of motion.     Right lower leg: Edema present.     Left lower leg: Edema present.  Skin:    General: Skin is warm and dry.     Capillary Refill: Capillary refill takes less than 2 seconds.     Comments: Small skin tears to right hand  Neurological:     General: No focal deficit present.     Mental Status: He is alert and oriented to person, place, and time.  Psychiatric:        Mood and Affect: Mood normal.        Behavior: Behavior normal.     (all labs ordered are listed, but only abnormal results are displayed) Labs Reviewed  BASIC METABOLIC PANEL WITH GFR - Abnormal; Notable for the following components:      Result Value   Glucose, Bld 155 (*)    Calcium  8.6 (*)    All other components within normal limits  PRO BRAIN NATRIURETIC PEPTIDE - Abnormal; Notable for the following components:   Pro Brain Natriuretic Peptide 3,199.0 (*)    All other components within normal limits  CBC WITH DIFFERENTIAL/PLATELET - Abnormal; Notable for the following components:   RBC 3.83 (*)    Hemoglobin 11.1 (*)    HCT 35.0 (*)    All other components within normal limits  D-DIMER, QUANTITATIVE - Abnormal;  Notable for the following components:   D-Dimer, Quant 1.34 (*)    All other components within normal limits  TROPONIN T, HIGH SENSITIVITY - Abnormal; Notable for the following components:   Troponin T High Sensitivity 44 (*)    All other components within normal limits  TROPONIN T, HIGH SENSITIVITY - Abnormal; Notable for the following  components:   Troponin T High Sensitivity 40 (*)    All other components within normal limits  RESP PANEL BY RT-PCR (RSV, FLU A&B, COVID)  [id number]    EKG: EKG Interpretation Date/Time:  Friday January 08 2025 12:16:39 EST Ventricular Rate:  88 PR Interval:    QRS Duration:  89 QT Interval:  382 QTC Calculation: 463 R Axis:   -16  Text Interpretation: Atrial fibrillation Borderline left axis deviation Low voltage, extremity leads RSR' in V1 or V2, probably normal variant No significant change since last tracing Confirmed by Dean Clarity [phone]) on 01/08/2025 12:24:22 PM  Radiology: [facility] Hand Complete Right Result Date: 01/08/2025 CLINICAL DATA:  Fall. EXAM: RIGHT HAND - COMPLETE 3+ VIEW COMPARISON:  None Available. FINDINGS: Negative for an acute fracture or dislocation in the right hand. No focal soft tissue abnormality. Right hand alignment is within normal limits. No gross abnormality to the right wrist. IMPRESSION: No acute bone abnormality to the right hand. Electronically Signed   By: Juliene Balder M.D.   On: 01/08/2025 14:17   DG Chest Port 1 View Result Date: 01/08/2025 CLINICAL DATA:  Shortness of breath. EXAM: PORTABLE CHEST 1 VIEW COMPARISON:  12/30/2024 FINDINGS: Cardiomegaly with markedly increased interstitial and airspace densities in both lungs, right side greater than left. The pattern of disease is suggestive for asymmetric pulmonary edema. Trachea is midline. Negative for a pneumothorax. No acute bone abnormality. IMPRESSION: Cardiomegaly with asymmetric pulmonary edema. Findings are suggestive for congestive heart failure. Electronically  Signed   By: Juliene Balder M.D.   On: 01/08/2025 14:15     Procedures   Medications Ordered in the ED  methylPREDNISolone  sodium succinate (SOLU-MEDROL ) 125 mg/2 mL injection 125 mg (125 mg Intravenous Given 01/08/25 1243)  albuterol  (PROVENTIL ) (2.5 MG/3ML) 0.083% nebulizer solution 2.5 mg (2.5 mg Nebulization Given 01/08/25 1239)  furosemide  (LASIX ) injection 40 mg (40 mg Intravenous Given 01/08/25 1533)                                    Medical Decision Making Amount and/or Complexity of Data Reviewed Labs: ordered. Radiology: ordered.  Risk Prescription drug management. Decision regarding hospitalization.   This patient presents to the ED for concern of sob, this involves an extensive number of treatment options, and is a complaint that carries with it a high risk of complications and morbidity.  The differential diagnosis includes copd exac, pna, covid/flu/rsv, rib fx, ptx, anemia   Co morbidities that complicate the patient evaluation  afib (on Eliquis ), sleep apnea, hld, IBS, chf, copd (normally on 2L)   Additional history obtained:  Additional history obtained from epic chart review External records from outside source obtained and reviewed including EMS report   Lab Tests:  I Ordered, and personally interpreted labs.  The pertinent results include:  cbc with nl wbc, hgb low at 11.1 (stable), bmp nl, ddimer is elevated at 1.34; trop sl elevated at 44 and 2nd trop is 40; BNP elevated at 3199; covid/flu/rsv neg   Imaging Studies ordered:  I ordered imaging studies including cxr and r hand  I independently visualized and interpreted imaging which showed CXR: Cardiomegaly with asymmetric pulmonary edema. Findings are  suggestive for congestive heart failure.  R hand: No acute bone abnormality to the right hand.  I agree with the radiologist interpretation   Cardiac Monitoring:  The patient was maintained on a cardiac monitor.  I personally  viewed and  interpreted the cardiac monitored which showed an underlying rhythm of: afib   Medicines ordered and prescription drug management:  I ordered medication including solumedrol, lasix , nebs  for sx  Reevaluation of the patient after these medicines showed that the patient improved I have reviewed the patients home medicines and have made adjustments as needed   Test Considered:  Ct, but he's allergic to iv dye;  He's on Eliquis    Critical Interventions:  oxygen    Consultations Obtained:  I requested consultation with the hospitalist (Dr. Zella),  and discussed lab and imaging findings as well as pertinent plan - he will admit   Problem List / ED Course:  Hypoxic resp failure:  likely a combination of copd and chf.  He is feeling better after meds, but is still requiring 4L oxygen .  D-dimer is slightly elevated, but he is on Eliquis .   Afib:  on eliquis  CHA2DS2/VAS Stroke Risk Points  Current as of about an hour ago     5 >= 2 Points: High Risk  1 to 1.99 Points: Medium Risk  0 Points: Low Risk    Last Change: N/A      Details    This score determines the patient's risk of having a stroke if the  patient has atrial fibrillation.       Points Metrics  1 Has Congestive Heart Failure:  Yes    Current as of about an hour ago  0 Has Vascular Disease:  No    Current as of about an hour ago  1 Has Hypertension:  Yes    Current as of about an hour ago  2 Age:  72    Current as of about an hour ago  1 Has Diabetes Excluding Gestational Diabetes:  Yes    Current as of about an hour ago  0 Had Stroke:  No  Had TIA:  No  Had Thromboembolism:  No    Current as of about an hour ago  0 Male:  No    Current as of about an hour ago             Reevaluation:  After the interventions noted above, I reevaluated the patient and found that they have :improved   Social Determinants of Health:  Lives at home   Dispostion:  After consideration of the diagnostic  results and the patients response to treatment, I feel that the patent would benefit from admission.       Final diagnoses:  COPD exacerbation (HCC)  Acute on chronic congestive heart failure, unspecified heart failure type (HCC)  On apixaban  therapy  Longstanding persistent atrial fibrillation (HCC)  Acute respiratory failure with hypoxia [facility])    ED Discharge Orders     None          Dean Clarity, MD 01/08/25 1610  "

## 2025-01-08 NOTE — ED Triage Notes (Addendum)
 Pt BIB EMS from home. Home health nurse noticed that he was in low 80s- on 2L baseline but it was not working. Was given a duoneb and put on 4L. Another duoneb given d/t rhonchi and wheezing. Was 98% on duoneb. Now sating 90% on 3L and still SOB.UTI symptoms over past week as well.Hernia repair was least week and staples were just removed.  Wife was recently sick. Pt has also fallen leaving the doctors office-significant bruising to hand. Pt AO4 but hard of hearing and talkative.   EMS vitals  BP  HR 90 w afib (hx of afib)  CBG 159

## 2025-01-09 ENCOUNTER — Encounter (HOSPITAL_COMMUNITY): Payer: Self-pay | Admitting: Internal Medicine

## 2025-01-09 ENCOUNTER — Other Ambulatory Visit: Payer: Self-pay

## 2025-01-09 LAB — BASIC METABOLIC PANEL WITH GFR
Anion gap: 13 (ref 5–15)
BUN: 14 mg/dL (ref 8–23)
CO2: 24 mmol/L (ref 22–32)
Calcium: 9.1 mg/dL (ref 8.9–10.3)
Chloride: 102 mmol/L (ref 98–111)
Creatinine, Ser: 1.03 mg/dL (ref 0.61–1.24)
GFR, Estimated: >60 mL/min
Glucose, Bld: 169 mg/dL — ABNORMAL HIGH (ref 70–99)
Potassium: 4.1 mmol/L (ref 3.5–5.1)
Sodium: 139 mmol/L (ref 135–145)

## 2025-01-09 LAB — CBC
HCT: 36.9 % — ABNORMAL LOW (ref 39.0–52.0)
Hemoglobin: 11.1 g/dL — ABNORMAL LOW (ref 13.0–17.0)
MCH: 28 pg (ref 26.0–34.0)
MCHC: 30.1 g/dL (ref 30.0–36.0)
MCV: 93.2 fL (ref 80.0–100.0)
Platelets: 227 10*3/uL (ref 150–400)
RBC: 3.96 MIL/uL — ABNORMAL LOW (ref 4.22–5.81)
RDW: 14.5 % (ref 11.5–15.5)
WBC: 4.5 10*3/uL (ref 4.0–10.5)
nRBC: 0 % (ref 0.0–0.2)

## 2025-01-09 NOTE — Plan of Care (Signed)

## 2025-01-09 NOTE — Progress Notes (Addendum)
 " PROGRESS NOTE    Maurice Wood  FMW:[id number] DOB: Apr 19, 1933 DOA: 01/08/2025 PCP: Seabron Alm, MD   Brief Narrative:  This 89 y.o. male with medical history significant for permanent A-fib on Eliquis , chronic hypoxic respiratory failure on 2 L due to COPD, heart failure with preserved EF who is being admitted to the hospital with acute on chronic hypoxic respiratory failure due to acute on chronic heart failure with preserved EF.  Patient and family reports over the last few days,  he has had increased lower extremity edema, orthopnea.  He regularly follows up with advanced heart failure team at [facility] and he is on Lasix  40 mg once weekly he has been compliant with this medication.  He was just discharged from hospital on 1/22 after hospitalization for incarcerated inguinal hernia repair.  Home health nurse came to visit him today and noticed that he was saturating in low 80s and noted that his home oxygen  was not working.  Patient was sent in the ED and he was initially wheezing and was given IV Solu-Medrol  and breathing treatment as well as IV Lasix  due to the evidence of heart failure.  Patient was admitted for acute on chronic hypoxic respiratory failure likely due to acute on chronic diastolic CHF.  Assessment & Plan:   Principal Problem:   Acute on chronic respiratory failure with hypoxia (HCC)   Acute on chronic hypoxic respiratory failure: Suspect this is mainly due to exacerbation of his heart failure, as he denies any recent cough or wheezing.   However He was initially wheezing when seen by his home health nurse. Continue supplemental oxygen  and wean as tolerated. Continue management of COPD and CHF exacerbation. He is currently requiring 5 L of supplemental oxygen  saturation 93%.   Acute on chronic heart failure with preserved EF: He is followed by [facility] health cardiology heart failure team, at baseline he is on 40 mg Lasix  once weekly and states he has been compliant with this.   Heart failure may have been exacerbated by his recent hospitalization and surgery, though he was noted to be euvolemic at the time of discharge last week.  Last echo 03/22/2024 with preserved EF and diastolic function not calculated. Continue fluid restriction.  Low-salt diet. He has received IV Lasix  in the emergency department, will give Lasix  40 IV once today. Further dosing per clinical response.   COPD: He remains on chronic 2 L nasal cannula oxygen , now on more oxygen  as above.   Given no wheezing on exam , No significant cough or dyspnea, I doubt that he has an ongoing acute exacerbation of his COPD.   He has received a dose of IV Solu-Medrol  in the emergency department. Continue Breztri  twice daily Continue supplemental oxygen  as above Continue Albuterol  nebulizer as needed.   OSA-CPAP nightly   Permanent atrial fibrillation: Heart rate is well-controlled  Continue Eliquis    BPH: Continue Flomax .   DVT prophylaxis: Eliquis  Code Status: Full code Family Communication: Wife at bed side Disposition Plan:     Status is: Inpatient Remains inpatient appropriate because: Patient admitted for acute on chronic hypoxic respiratory failure due to acute on chronic diastolic CHF requiring IV diuresis. Patient is not medically ready for discharge yet.   Consultants:  None  Procedures: None  Antimicrobials:  Anti-infectives (From admission, onward)    None      Subjective: Patient was seen and examined at bedside.  Overnight events noted. Patient seems better , states he is slightly better than yesterday  but still feels short of breath with exertion. He has Significant bilateral leg swelling.  Objective: Vitals:   01/09/25 0019 01/09/25 0441 01/09/25 0828 01/09/25 0838  BP: 100/63 105/71 100/60   Pulse: 77 70 88   Resp: 20 20 18 18   Temp: 98 F (36.7 C) 98.6 F (37 C) 98.3 F (36.8 C)   TempSrc: Oral Oral Oral   SpO2: 92% 97% (!) 78% 95%  Height: 5' 10 (1.778 m)        Intake/Output Summary (Last 24 hours) at 01/09/2025 1057 Last data filed at 01/09/2025 1035 Gross per 24 hour  Intake --  Output 1100 ml  Net -1100 ml   There were no vitals filed for this visit.  Examination:  General exam: Appears calm and comfortable, deconditioned, not in any acute distress. Respiratory system: Decreased breath sounds. Respiratory effort normal.  RR 17 Cardiovascular system: S1 & S2 heard, RRR. No JVD, murmurs, rubs, gallops or clicks. Gastrointestinal system: Abdomen is non distended, soft and non tender. Normal bowel sounds heard. Central nervous system: Alert and oriented x 3. No focal neurological deficits. Extremities: Edema +, no cyanosis, no clubbing. Psychiatry: Judgement and insight appear normal. Mood & affect appropriate.     Data Reviewed: I have personally reviewed following labs and imaging studies  CBC: Recent Labs  Lab 01/08/25 1236 01/09/25 0742  WBC 7.4 4.5  NEUTROABS 6.1  --   HGB 11.1* 11.1*  HCT 35.0* 36.9*  MCV 91.4 93.2  PLT 233 227   Basic Metabolic Panel: Recent Labs  Lab 01/08/25 1236 01/09/25 0742  NA 139 139  K 4.0 4.1  CL 105 102  CO2 24 24  GLUCOSE 155* 169*  BUN 12 14  CREATININE 0.85 1.03  CALCIUM  8.6* 9.1   GFR: CrCl cannot be calculated (Unknown ideal weight.). Liver Function Tests: No results for input(s): AST, ALT, ALKPHOS, BILITOT, PROT, ALBUMIN in the last 168 hours. No results for input(s): LIPASE, AMYLASE in the last 168 hours. No results for input(s): AMMONIA in the last 168 hours. Coagulation Profile: No results for input(s): INR, PROTIME in the last 168 hours. Cardiac Enzymes: No results for input(s): CKTOTAL, CKMB, CKMBINDEX, TROPONINI in the last 168 hours. BNP (last 3 results) Recent Labs    01/08/25 1236  PROBNP 3,199.0*   HbA1C: No results for input(s): HGBA1C in the last 72 hours. CBG: No results for input(s): GLUCAP in the last 168  hours. Lipid Profile: No results for input(s): CHOL, HDL, LDLCALC, TRIG, CHOLHDL, LDLDIRECT in the last 72 hours. Thyroid  Function Tests: No results for input(s): TSH, T4TOTAL, FREET4, T3FREE, THYROIDAB in the last 72 hours. Anemia Panel: No results for input(s): VITAMINB12, FOLATE, FERRITIN, TIBC, IRON, RETICCTPCT in the last 72 hours. Sepsis Labs: No results for input(s): PROCALCITON, LATICACIDVEN in the last 168 hours.  Recent Results (from the past 240 hours)  Resp panel by RT-PCR (RSV, Flu A&B, Covid) Anterior Nasal Swab     Status: None   Collection Time: 01/08/25 12:36 PM   Specimen: Anterior Nasal Swab  Result Value Ref Range Status   SARS Coronavirus 2 by RT PCR NEGATIVE NEGATIVE Final    Comment: (NOTE) SARS-CoV-2 target nucleic acids are NOT DETECTED.  The SARS-CoV-2 RNA is generally detectable in upper respiratory specimens during the acute phase of infection. The lowest concentration of SARS-CoV-2 viral copies this assay can detect is 138 copies/mL. A negative result does not preclude SARS-Cov-2 infection and should not be used as the sole basis  for treatment or other patient management decisions. A negative result may occur with  improper specimen collection/handling, submission of specimen other than nasopharyngeal swab, presence of viral mutation(s) within the areas targeted by this assay, and inadequate number of viral copies(<138 copies/mL). A negative result must be combined with clinical observations, patient history, and epidemiological information. The expected result is Negative.  Fact Sheet for Patients:  bloggercourse.com  Fact Sheet for Healthcare Providers:  seriousbroker.it  This test is no t yet approved or cleared by the [place]  FDA and  has been authorized for detection and/or diagnosis of SARS-CoV-2 by FDA under an Emergency Use Authorization (EUA). This  EUA will remain  in effect (meaning this test can be used) for the duration of the COVID-19 declaration under Section 564(b)(1) of the Act, 21 U.S.C.section 360bbb-3(b)(1), unless the authorization is terminated  or revoked sooner.       Influenza A by PCR NEGATIVE NEGATIVE Final   Influenza B by PCR NEGATIVE NEGATIVE Final    Comment: (NOTE) The Xpert Xpress SARS-CoV-2/FLU/RSV plus assay is intended as an aid in the diagnosis of influenza from Nasopharyngeal swab specimens and should not be used as a sole basis for treatment. Nasal washings and aspirates are unacceptable for Xpert Xpress SARS-CoV-2/FLU/RSV testing.  Fact Sheet for Patients: bloggercourse.com  Fact Sheet for Healthcare Providers: seriousbroker.it  This test is not yet approved or cleared by the [place]  FDA and has been authorized for detection and/or diagnosis of SARS-CoV-2 by FDA under an Emergency Use Authorization (EUA). This EUA will remain in effect (meaning this test can be used) for the duration of the COVID-19 declaration under Section 564(b)(1) of the Act, 21 U.S.C. section 360bbb-3(b)(1), unless the authorization is terminated or revoked.     Resp Syncytial Virus by PCR NEGATIVE NEGATIVE Final    Comment: (NOTE) Fact Sheet for Patients: bloggercourse.com  Fact Sheet for Healthcare Providers: seriousbroker.it  This test is not yet approved or cleared by the [place]  FDA and has been authorized for detection and/or diagnosis of SARS-CoV-2 by FDA under an Emergency Use Authorization (EUA). This EUA will remain in effect (meaning this test can be used) for the duration of the COVID-19 declaration under Section 564(b)(1) of the Act, 21 U.S.C. section 360bbb-3(b)(1), unless the authorization is terminated or revoked.  Performed at [facility], [address], [place], [place] [address]    Radiology Studies: DG Hand Complete Right Result Date: 01/08/2025 CLINICAL DATA:  Fall. EXAM: RIGHT HAND - COMPLETE 3+ VIEW COMPARISON:  None Available. FINDINGS: Negative for an acute fracture or dislocation in the right hand. No focal soft tissue abnormality. Right hand alignment is within normal limits. No gross abnormality to the right wrist. IMPRESSION: No acute bone abnormality to the right hand. Electronically Signed   By: Juliene Balder M.D.   On: 01/08/2025 14:17   DG Chest Port 1 View Result Date: 01/08/2025 CLINICAL DATA:  Shortness of breath. EXAM: PORTABLE CHEST 1 VIEW COMPARISON:  12/30/2024 FINDINGS: Cardiomegaly with markedly increased interstitial and airspace densities in both lungs, right side greater than left. The pattern of disease is suggestive for asymmetric pulmonary edema. Trachea is midline. Negative for a pneumothorax. No acute bone abnormality. IMPRESSION: Cardiomegaly with asymmetric pulmonary edema. Findings are suggestive for congestive heart failure. Electronically Signed   By: Juliene Balder M.D.   On: 01/08/2025 14:15   Scheduled Meds:  apixaban   5 mg Oral BID   budesonide -glycopyrrolate -formoterol   2 puff Inhalation BID  tamsulosin   0.4 mg Oral Daily   Continuous Infusions:   LOS: 1 day    Time spent: 50 mins    Darcel Dawley, MD [facility]   If 7PM-7AM, please contact night-coverage  "

## 2025-01-09 NOTE — Progress Notes (Signed)
" °   01/09/25 2300  BiPAP/CPAP/SIPAP  BiPAP/CPAP/SIPAP Pt Type Adult  BiPAP/CPAP/SIPAP Resmed  Mask Type Full face mask  Mask Size Large  Respiratory Rate 18 breaths/min  Flow Rate 3 lpm  Patient Home Machine No  Patient Home Mask No  Patient Home Tubing No  Auto Titrate Yes  Minimum cmH2O 5 cmH2O  Maximum cmH2O 15 cmH2O  Device Plugged into RED Power Outlet Yes  BiPAP/CPAP /SiPAP Vitals  Pulse Rate 79  Resp 18  SpO2 92 %  Bilateral Breath Sounds Clear;Diminished    "

## 2025-01-10 MED ORDER — FUROSEMIDE 10 MG/ML IJ SOLN
40.0000 mg | Freq: Once | INTRAMUSCULAR | Status: AC
  Administered 2025-01-10: 40 mg via INTRAVENOUS
  Filled 2025-01-10: qty 4

## 2025-01-10 MED ORDER — GUAIFENESIN-DM 100-10 MG/5ML PO SYRP
5.0000 mL | ORAL_SOLUTION | ORAL | Status: AC | PRN
  Administered 2025-01-10: 5 mL via ORAL
  Filled 2025-01-10: qty 5

## 2025-01-10 NOTE — Progress Notes (Signed)
" °   01/10/25 2249  BiPAP/CPAP/SIPAP  BiPAP/CPAP/SIPAP Pt Type Adult  BiPAP/CPAP/SIPAP Resmed  Mask Type Full face mask  Dentures removed? Not applicable  Mask Size Large  Flow Rate 4 lpm  Patient Home Machine No  Patient Home Mask No  Patient Home Tubing No  Auto Titrate Yes  Minimum cmH2O 5 cmH2O  Maximum cmH2O 15 cmH2O  CPAP/SIPAP surface wiped down Yes  Device Plugged into RED Power Outlet Yes  BiPAP/CPAP /SiPAP Vitals  Bilateral Breath Sounds Expiratory wheezes;Diminished    "

## 2025-01-10 NOTE — Progress Notes (Signed)
 " PROGRESS NOTE    Maurice Wood  FMW:[id number] DOB: October 16, 1933 DOA: 01/08/2025 PCP: Seabron Alm, MD   Brief Narrative:  This 89 y.o. male with medical history significant for permanent A-fib on Eliquis , chronic hypoxic respiratory failure on 2 L due to COPD, heart failure with preserved EF who is being admitted to the hospital with acute on chronic hypoxic respiratory failure due to acute on chronic heart failure with preserved EF.  Patient and family reports over the last few days,  he has had increased lower extremity edema, orthopnea.  He regularly follows up with advanced heart failure team at [facility] and he is on Lasix  40 mg once weekly he has been compliant with this medication.  He was just discharged from hospital on 1/22 after hospitalization for incarcerated inguinal hernia repair.  Home health nurse came to visit him today and noticed that he was saturating in low 80s and noted that his home oxygen  was not working.  Patient was sent in the ED and he was initially wheezing and was given IV Solu-Medrol  and breathing treatment as well as IV Lasix  due to the evidence of heart failure.  Patient was admitted for acute on chronic hypoxic respiratory failure likely due to acute on chronic diastolic CHF.  Assessment & Plan:   Principal Problem:   Acute on chronic respiratory failure with hypoxia (HCC)   Acute on chronic hypoxic respiratory failure: Suspect this is mainly due to exacerbation of  heart failure, as he denies any recent cough or wheezing.   However He was initially wheezing when seen by his home health nurse. Continue supplemental oxygen  and wean as tolerated. Continue management of COPD and CHF exacerbation. He is currently requiring 5 L of supplemental oxygen  saturation 93%.   Acute on chronic heart failure with preserved EF: He is followed by [facility] health cardiology heart failure team, at baseline he is on 40 mg Lasix  once weekly and states he has been compliant with this.   Heart failure may have been exacerbated by his recent hospitalization and surgery, though he was noted to be euvolemic at the time of discharge last week.  Last echo 03/22/2024 with preserved EF and diastolic function not calculated. Continue fluid restriction.  Low-salt diet. He has received IV Lasix  in the emergency department, will give Lasix  40 IV once today. Further dosing per clinical response.   Intake/Output Summary (Last 24 hours) at 01/10/2025 1200 Last data filed at 01/10/2025 0830 Gross per 24 hour  Intake 960 ml  Output 600 ml  Net 360 ml      COPD: He remains on chronic 2 L nasal cannula oxygen , now on more oxygen  as above.   Given no wheezing on exam , No significant cough or dyspnea, I doubt that he has an ongoing acute exacerbation of his COPD.   He has received a dose of IV Solu-Medrol  in the emergency department. Continue Breztri  twice daily Continue supplemental oxygen  as above Continue Albuterol  nebulizer as needed.   OSA-CPAP nightly   Permanent atrial fibrillation: Heart rate is well-controlled  Continue Eliquis    BPH: Continue Flomax .   DVT prophylaxis: Eliquis  Code Status: Full code Family Communication: Wife at bed side Disposition Plan:     Status is: Inpatient Remains inpatient appropriate because: Patient admitted for acute on chronic hypoxic respiratory failure due to acute on chronic diastolic CHF requiring IV diuresis. Patient is not medically ready for discharge yet.   Consultants:  None  Procedures: None  Antimicrobials:  Anti-infectives (  From admission, onward)    None      Subjective: Patient was seen and examined at bedside.  Overnight events noted. Patient seems slightly better , states still feels short of breath with exertion. He has Significant bilateral leg swelling.  Objective: Vitals:   01/09/25 2109 01/09/25 2300 01/10/25 0611 01/10/25 0839  BP: (!) 107/56  107/80   Pulse: 78 79 67   Resp: 16 18 16    Temp: 98 F  (36.7 C)  97.6 F (36.4 C)   TempSrc: Oral  Oral   SpO2: 100% 92% 96% 96%  Height:        Intake/Output Summary (Last 24 hours) at 01/10/2025 1200 Last data filed at 01/10/2025 0830 Gross per 24 hour  Intake 960 ml  Output 600 ml  Net 360 ml   There were no vitals filed for this visit.  Examination:  General exam: Appears calm and comfortable, deconditioned, not in any acute distress. Respiratory system: Decreased breath sounds. Respiratory effort normal.  RR 15 Cardiovascular system: S1 & S2 heard, RRR. No JVD, murmurs, rubs, gallops or clicks. Gastrointestinal system: Abdomen is non distended, soft and non tender. Normal bowel sounds heard. Central nervous system: Alert and oriented x 3. No focal neurological deficits. Extremities: Edema ++, no cyanosis, no clubbing. Psychiatry: Judgement and insight appear normal. Mood & affect appropriate.    Data Reviewed: I have personally reviewed following labs and imaging studies  CBC: Recent Labs  Lab 01/08/25 1236 01/09/25 0742  WBC 7.4 4.5  NEUTROABS 6.1  --   HGB 11.1* 11.1*  HCT 35.0* 36.9*  MCV 91.4 93.2  PLT 233 227   Basic Metabolic Panel: Recent Labs  Lab 01/08/25 1236 01/09/25 0742  NA 139 139  K 4.0 4.1  CL 105 102  CO2 24 24  GLUCOSE 155* 169*  BUN 12 14  CREATININE 0.85 1.03  CALCIUM  8.6* 9.1   GFR: CrCl cannot be calculated (Unknown ideal weight.). Liver Function Tests: No results for input(s): AST, ALT, ALKPHOS, BILITOT, PROT, ALBUMIN in the last 168 hours. No results for input(s): LIPASE, AMYLASE in the last 168 hours. No results for input(s): AMMONIA in the last 168 hours. Coagulation Profile: No results for input(s): INR, PROTIME in the last 168 hours. Cardiac Enzymes: No results for input(s): CKTOTAL, CKMB, CKMBINDEX, TROPONINI in the last 168 hours. BNP (last 3 results) Recent Labs    01/08/25 1236  PROBNP 3,199.0*   HbA1C: No results for input(s): HGBA1C  in the last 72 hours. CBG: No results for input(s): GLUCAP in the last 168 hours. Lipid Profile: No results for input(s): CHOL, HDL, LDLCALC, TRIG, CHOLHDL, LDLDIRECT in the last 72 hours. Thyroid  Function Tests: No results for input(s): TSH, T4TOTAL, FREET4, T3FREE, THYROIDAB in the last 72 hours. Anemia Panel: No results for input(s): VITAMINB12, FOLATE, FERRITIN, TIBC, IRON, RETICCTPCT in the last 72 hours. Sepsis Labs: No results for input(s): PROCALCITON, LATICACIDVEN in the last 168 hours.  Recent Results (from the past 240 hours)  Resp panel by RT-PCR (RSV, Flu A&B, Covid) Anterior Nasal Swab     Status: None   Collection Time: 01/08/25 12:36 PM   Specimen: Anterior Nasal Swab  Result Value Ref Range Status   SARS Coronavirus 2 by RT PCR NEGATIVE NEGATIVE Final    Comment: (NOTE) SARS-CoV-2 target nucleic acids are NOT DETECTED.  The SARS-CoV-2 RNA is generally detectable in upper respiratory specimens during the acute phase of infection. The lowest concentration of SARS-CoV-2 viral copies this  assay can detect is 138 copies/mL. A negative result does not preclude SARS-Cov-2 infection and should not be used as the sole basis for treatment or other patient management decisions. A negative result may occur with  improper specimen collection/handling, submission of specimen other than nasopharyngeal swab, presence of viral mutation(s) within the areas targeted by this assay, and inadequate number of viral copies(<138 copies/mL). A negative result must be combined with clinical observations, patient history, and epidemiological information. The expected result is Negative.  Fact Sheet for Patients:  bloggercourse.com  Fact Sheet for Healthcare Providers:  seriousbroker.it  This test is no t yet approved or cleared by the [place]  FDA and  has been authorized for detection and/or  diagnosis of SARS-CoV-2 by FDA under an Emergency Use Authorization (EUA). This EUA will remain  in effect (meaning this test can be used) for the duration of the COVID-19 declaration under Section 564(b)(1) of the Act, 21 U.S.C.section 360bbb-3(b)(1), unless the authorization is terminated  or revoked sooner.       Influenza A by PCR NEGATIVE NEGATIVE Final   Influenza B by PCR NEGATIVE NEGATIVE Final    Comment: (NOTE) The Xpert Xpress SARS-CoV-2/FLU/RSV plus assay is intended as an aid in the diagnosis of influenza from Nasopharyngeal swab specimens and should not be used as a sole basis for treatment. Nasal washings and aspirates are unacceptable for Xpert Xpress SARS-CoV-2/FLU/RSV testing.  Fact Sheet for Patients: bloggercourse.com  Fact Sheet for Healthcare Providers: seriousbroker.it  This test is not yet approved or cleared by the [place]  FDA and has been authorized for detection and/or diagnosis of SARS-CoV-2 by FDA under an Emergency Use Authorization (EUA). This EUA will remain in effect (meaning this test can be used) for the duration of the COVID-19 declaration under Section 564(b)(1) of the Act, 21 U.S.C. section 360bbb-3(b)(1), unless the authorization is terminated or revoked.     Resp Syncytial Virus by PCR NEGATIVE NEGATIVE Final    Comment: (NOTE) Fact Sheet for Patients: bloggercourse.com  Fact Sheet for Healthcare Providers: seriousbroker.it  This test is not yet approved or cleared by the [place]  FDA and has been authorized for detection and/or diagnosis of SARS-CoV-2 by FDA under an Emergency Use Authorization (EUA). This EUA will remain in effect (meaning this test can be used) for the duration of the COVID-19 declaration under Section 564(b)(1) of the Act, 21 U.S.C. section 360bbb-3(b)(1), unless the authorization is terminated  or revoked.  Performed at [facility], [address], [place], [place] [address]    Radiology Studies: DG Hand Complete Right Result Date: 01/08/2025 CLINICAL DATA:  Fall. EXAM: RIGHT HAND - COMPLETE 3+ VIEW COMPARISON:  None Available. FINDINGS: Negative for an acute fracture or dislocation in the right hand. No focal soft tissue abnormality. Right hand alignment is within normal limits. No gross abnormality to the right wrist. IMPRESSION: No acute bone abnormality to the right hand. Electronically Signed   By: Juliene Balder M.D.   On: 01/08/2025 14:17   DG Chest Port 1 View Result Date: 01/08/2025 CLINICAL DATA:  Shortness of breath. EXAM: PORTABLE CHEST 1 VIEW COMPARISON:  12/30/2024 FINDINGS: Cardiomegaly with markedly increased interstitial and airspace densities in both lungs, right side greater than left. The pattern of disease is suggestive for asymmetric pulmonary edema. Trachea is midline. Negative for a pneumothorax. No acute bone abnormality. IMPRESSION: Cardiomegaly with asymmetric pulmonary edema. Findings are suggestive for congestive heart failure. Electronically Signed   By: Juliene Balder HERO.D.  On: 01/08/2025 14:15   Scheduled Meds:  apixaban   5 mg Oral BID   budesonide -glycopyrrolate -formoterol   2 puff Inhalation BID   tamsulosin   0.4 mg Oral Daily   Continuous Infusions:   LOS: 2 days    Time spent: 35 mins    Darcel Dawley, MD [facility]   If 7PM-7AM, please contact night-coverage  "

## 2025-01-11 ENCOUNTER — Other Ambulatory Visit (HOSPITAL_COMMUNITY): Payer: Self-pay

## 2025-01-11 DIAGNOSIS — J9621 Acute and chronic respiratory failure with hypoxia: Secondary | ICD-10-CM | POA: Diagnosis not present

## 2025-01-11 MED ORDER — FUROSEMIDE 10 MG/ML IJ SOLN
40.0000 mg | Freq: Once | INTRAMUSCULAR | Status: AC
  Administered 2025-01-11: 40 mg via INTRAVENOUS
  Filled 2025-01-11: qty 4

## 2025-01-11 NOTE — Progress Notes (Signed)
 3 staples removed from abdomen at this time per this nurse. No distress noted, sites clean and dry with appropriate skin color.

## 2025-01-11 NOTE — Progress Notes (Signed)
 Heart Failure Navigator Progress Note  Assessed for Heart & Vascular TOC clinic readiness.  Patient does not meet criteria due to he is an Advanced Heart Failure team patient of Dr. [name]. .   Navigator will sign off at this time.   Stephane Haddock, BSN, Scientist, Clinical (histocompatibility And Immunogenetics) Only

## 2025-01-11 NOTE — Progress Notes (Signed)
 " PROGRESS NOTE    Maurice Wood  FMW:[id number] DOB: August 01, 1933 DOA: 01/08/2025 PCP: Seabron Alm, MD   Brief Narrative:  This 89 y.o. male with medical history significant for permanent A-fib on Eliquis , chronic hypoxic respiratory failure on 2 L due to COPD, heart failure with preserved EF who is being admitted to the hospital with acute on chronic hypoxic respiratory failure due to acute on chronic heart failure with preserved EF.  Patient and family reports over the last few days,  he has had increased lower extremity edema, orthopnea.  He regularly follows up with advanced heart failure team at [facility] and he is on Lasix  40 mg once weekly he has been compliant with this medication.  He was just discharged from hospital on 1/22 after hospitalization for incarcerated inguinal hernia repair.  Home health nurse came to visit him today and noticed that he was saturating in low 80s and noted that his home oxygen  was not working.  Patient was sent in the ED and he was initially wheezing and was given IV Solu-Medrol  and breathing treatment as well as IV Lasix  due to the evidence of heart failure.  Patient was admitted for acute on chronic hypoxic respiratory failure likely due to acute on chronic diastolic CHF.  Assessment & Plan:   Principal Problem:   Acute on chronic respiratory failure with hypoxia (HCC)   Acute on chronic hypoxic respiratory failure: Suspect this is mainly due to exacerbation of  heart failure, as he denies any recent cough or wheezing.   However He was initially wheezing when seen by his home health nurse. Continue supplemental oxygen  and wean as tolerated. Continue management of COPD and CHF exacerbation. He is currently requiring 4 L of supplemental oxygen  saturation 93%.   Acute on chronic heart failure with preserved EF: He is followed by [facility] health cardiology heart failure team, at baseline he is on 40 mg Lasix  once weekly and states he has been compliant with this.   Heart failure may have been exacerbated by his recent hospitalization and surgery, though he was noted to be euvolemic at the time of discharge last week.  Last echo 03/22/2024 with preserved EF and diastolic function not calculated. Continue fluid restriction.  Low-salt diet. He has received IV Lasix  in the emergency department, will give Lasix  40 IV once today. Further dosing per clinical response.   Intake/Output Summary (Last 24 hours) at 01/11/2025 1322 Last data filed at 01/11/2025 1242 Gross per 24 hour  Intake 480 ml  Output 2175 ml  Net -1695 ml      COPD: He remains on chronic 2 L nasal cannula oxygen , now on more oxygen  as above.   Given no wheezing on exam , No significant cough or dyspnea, I doubt that he has an ongoing acute exacerbation of his COPD.   He has received a dose of IV Solu-Medrol  in the emergency department. Continue Breztri  twice daily Continue supplemental oxygen  as above Continue Albuterol  nebulizer as needed.   OSA-CPAP nightly   Permanent atrial fibrillation: Heart rate is well-controlled  Continue Eliquis    BPH: Continue Flomax .   DVT prophylaxis: Eliquis  Code Status: Full code Family Communication: Wife at bed side Disposition Plan:     Status is: Inpatient Remains inpatient appropriate because: Patient admitted for acute on chronic hypoxic respiratory failure due to acute on chronic diastolic CHF requiring IV diuresis.  He still requires IV diuresis. Patient is not medically ready for discharge yet.   Consultants:  None  Procedures: None  Antimicrobials:  Anti-infectives (From admission, onward)    None      Subjective: Patient was seen and examined at bedside.  Overnight events noted. Patient seems slightly better , states still feels short of breath with exertion. He has Significant bilateral leg swelling.   Objective: Vitals:   01/10/25 2046 01/11/25 0523 01/11/25 0546 01/11/25 0840  BP: 124/83 115/66    Pulse: 93 79     Resp: 14 14    Temp: 97.7 F (36.5 C) 97.8 F (36.6 C)    TempSrc: Oral Oral    SpO2: 97% 90% 96% 96%  Weight:   72.5 kg   Height:        Intake/Output Summary (Last 24 hours) at 01/11/2025 1322 Last data filed at 01/11/2025 1242 Gross per 24 hour  Intake 480 ml  Output 2175 ml  Net -1695 ml   Filed Weights   01/11/25 0546  Weight: 72.5 kg    Examination:  General exam: Appears calm and comfortable, deconditioned, not in any acute distress. Respiratory system: Decreased breath sounds. Respiratory effort normal.  RR 14 Cardiovascular system: S1 & S2 heard, RRR. No JVD, murmurs, rubs, gallops or clicks. Gastrointestinal system: Abdomen is non distended, soft and non tender. Normal bowel sounds heard. Central nervous system: Alert and oriented x 3. No focal neurological deficits. Extremities: Edema ++, no cyanosis, no clubbing. Psychiatry: Judgement and insight appear normal. Mood & affect appropriate.    Data Reviewed: I have personally reviewed following labs and imaging studies  CBC: Recent Labs  Lab 01/08/25 1236 01/09/25 0742  WBC 7.4 4.5  NEUTROABS 6.1  --   HGB 11.1* 11.1*  HCT 35.0* 36.9*  MCV 91.4 93.2  PLT 233 227   Basic Metabolic Panel: Recent Labs  Lab 01/08/25 1236 01/09/25 0742  NA 139 139  K 4.0 4.1  CL 105 102  CO2 24 24  GLUCOSE 155* 169*  BUN 12 14  CREATININE 0.85 1.03  CALCIUM  8.6* 9.1   GFR: Estimated Creatinine Clearance: 49.9 mL/min (by C-G formula based on SCr of 1.03 mg/dL). Liver Function Tests: No results for input(s): AST, ALT, ALKPHOS, BILITOT, PROT, ALBUMIN in the last 168 hours. No results for input(s): LIPASE, AMYLASE in the last 168 hours. No results for input(s): AMMONIA in the last 168 hours. Coagulation Profile: No results for input(s): INR, PROTIME in the last 168 hours. Cardiac Enzymes: No results for input(s): CKTOTAL, CKMB, CKMBINDEX, TROPONINI in the last 168 hours. BNP (last 3  results) Recent Labs    01/08/25 1236  PROBNP 3,199.0*   HbA1C: No results for input(s): HGBA1C in the last 72 hours. CBG: No results for input(s): GLUCAP in the last 168 hours. Lipid Profile: No results for input(s): CHOL, HDL, LDLCALC, TRIG, CHOLHDL, LDLDIRECT in the last 72 hours. Thyroid  Function Tests: No results for input(s): TSH, T4TOTAL, FREET4, T3FREE, THYROIDAB in the last 72 hours. Anemia Panel: No results for input(s): VITAMINB12, FOLATE, FERRITIN, TIBC, IRON, RETICCTPCT in the last 72 hours. Sepsis Labs: No results for input(s): PROCALCITON, LATICACIDVEN in the last 168 hours.  Recent Results (from the past 240 hours)  Resp panel by RT-PCR (RSV, Flu A&B, Covid) Anterior Nasal Swab     Status: None   Collection Time: 01/08/25 12:36 PM   Specimen: Anterior Nasal Swab  Result Value Ref Range Status   SARS Coronavirus 2 by RT PCR NEGATIVE NEGATIVE Final    Comment: (NOTE) SARS-CoV-2 target nucleic acids are NOT DETECTED.  The SARS-CoV-2 RNA is generally detectable in upper respiratory specimens during the acute phase of infection. The lowest concentration of SARS-CoV-2 viral copies this assay can detect is 138 copies/mL. A negative result does not preclude SARS-Cov-2 infection and should not be used as the sole basis for treatment or other patient management decisions. A negative result may occur with  improper specimen collection/handling, submission of specimen other than nasopharyngeal swab, presence of viral mutation(s) within the areas targeted by this assay, and inadequate number of viral copies(<138 copies/mL). A negative result must be combined with clinical observations, patient history, and epidemiological information. The expected result is Negative.  Fact Sheet for Patients:  bloggercourse.com  Fact Sheet for Healthcare Providers:  seriousbroker.it  This test  is no t yet approved or cleared by the [place]  FDA and  has been authorized for detection and/or diagnosis of SARS-CoV-2 by FDA under an Emergency Use Authorization (EUA). This EUA will remain  in effect (meaning this test can be used) for the duration of the COVID-19 declaration under Section 564(b)(1) of the Act, 21 U.S.C.section 360bbb-3(b)(1), unless the authorization is terminated  or revoked sooner.       Influenza A by PCR NEGATIVE NEGATIVE Final   Influenza B by PCR NEGATIVE NEGATIVE Final    Comment: (NOTE) The Xpert Xpress SARS-CoV-2/FLU/RSV plus assay is intended as an aid in the diagnosis of influenza from Nasopharyngeal swab specimens and should not be used as a sole basis for treatment. Nasal washings and aspirates are unacceptable for Xpert Xpress SARS-CoV-2/FLU/RSV testing.  Fact Sheet for Patients: bloggercourse.com  Fact Sheet for Healthcare Providers: seriousbroker.it  This test is not yet approved or cleared by the [place]  FDA and has been authorized for detection and/or diagnosis of SARS-CoV-2 by FDA under an Emergency Use Authorization (EUA). This EUA will remain in effect (meaning this test can be used) for the duration of the COVID-19 declaration under Section 564(b)(1) of the Act, 21 U.S.C. section 360bbb-3(b)(1), unless the authorization is terminated or revoked.     Resp Syncytial Virus by PCR NEGATIVE NEGATIVE Final    Comment: (NOTE) Fact Sheet for Patients: bloggercourse.com  Fact Sheet for Healthcare Providers: seriousbroker.it  This test is not yet approved or cleared by the [place]  FDA and has been authorized for detection and/or diagnosis of SARS-CoV-2 by FDA under an Emergency Use Authorization (EUA). This EUA will remain in effect (meaning this test can be used) for the duration of the COVID-19 declaration under Section  564(b)(1) of the Act, 21 U.S.C. section 360bbb-3(b)(1), unless the authorization is terminated or revoked.  Performed at [facility], [address], [place], [place] [address]    Radiology Studies: No results found.  Scheduled Meds:  apixaban   5 mg Oral BID   budesonide -glycopyrrolate -formoterol   2 puff Inhalation BID   tamsulosin   0.4 mg Oral Daily   Continuous Infusions:   LOS: 3 days    Time spent: 35 mins    Darcel Dawley, MD [facility]   If 7PM-7AM, please contact night-coverage  "

## 2025-01-11 NOTE — Progress Notes (Signed)
 Mobility Specialist - Progress Note:   01/11/25 1406  Mobility  Activity Ambulated with assistance  Level of Assistance Contact guard assist, steadying assist  Assistive Device Front wheel walker  Distance Ambulated (ft) 15 ft  Activity Response Tolerated fair  Mobility Referral Yes  Mobility visit 1 Mobility  Mobility Specialist Start Time (ACUTE ONLY) 1321  Mobility Specialist Stop Time (ACUTE ONLY) 1330  Mobility Specialist Time Calculation (min) (ACUTE ONLY) 9 min   Pt was received standing beside, family requested help to get Pt to restroom. Returned to bed with all needs met.  [employer] - Mobility Specialist - Acute Rehabilitation Can be reached via [employer]

## 2025-01-12 DIAGNOSIS — J9621 Acute and chronic respiratory failure with hypoxia: Secondary | ICD-10-CM | POA: Diagnosis not present

## 2025-01-12 LAB — BASIC METABOLIC PANEL WITH GFR
Anion gap: 7 (ref 5–15)
BUN: 14 mg/dL (ref 8–23)
CO2: 31 mmol/L (ref 22–32)
Calcium: 8.7 mg/dL — ABNORMAL LOW (ref 8.9–10.3)
Chloride: 99 mmol/L (ref 98–111)
Creatinine, Ser: 0.81 mg/dL (ref 0.61–1.24)
GFR, Estimated: >60 mL/min
Glucose, Bld: 113 mg/dL — ABNORMAL HIGH (ref 70–99)
Potassium: 3.4 mmol/L — ABNORMAL LOW (ref 3.5–5.1)
Sodium: 138 mmol/L (ref 135–145)

## 2025-01-12 LAB — PHOSPHORUS: Phosphorus: 2.8 mg/dL (ref 2.5–4.6)

## 2025-01-12 LAB — CBC
HCT: 33.5 % — ABNORMAL LOW (ref 39.0–52.0)
Hemoglobin: 10.3 g/dL — ABNORMAL LOW (ref 13.0–17.0)
MCH: 28.2 pg (ref 26.0–34.0)
MCHC: 30.7 g/dL (ref 30.0–36.0)
MCV: 91.8 fL (ref 80.0–100.0)
Platelets: 207 10*3/uL (ref 150–400)
RBC: 3.65 MIL/uL — ABNORMAL LOW (ref 4.22–5.81)
RDW: 14.4 % (ref 11.5–15.5)
WBC: 5.9 10*3/uL (ref 4.0–10.5)
nRBC: 0 % (ref 0.0–0.2)

## 2025-01-12 LAB — TROPONIN T, HIGH SENSITIVITY
Troponin T High Sensitivity: 39 ng/L — ABNORMAL HIGH (ref 0–19)
Troponin T High Sensitivity: 37 ng/L — ABNORMAL HIGH (ref 0–19)

## 2025-01-12 LAB — MAGNESIUM: Magnesium: 1.8 mg/dL (ref 1.7–2.4)

## 2025-01-12 LAB — PRO BRAIN NATRIURETIC PEPTIDE: Pro Brain Natriuretic Peptide: 1129 pg/mL — ABNORMAL HIGH

## 2025-01-12 MED ORDER — FUROSEMIDE 10 MG/ML IJ SOLN
40.0000 mg | Freq: Once | INTRAMUSCULAR | Status: AC
  Administered 2025-01-12: 40 mg via INTRAVENOUS
  Filled 2025-01-12: qty 4

## 2025-01-12 MED ORDER — NITROGLYCERIN 0.4 MG SL SUBL
SUBLINGUAL_TABLET | SUBLINGUAL | Status: AC
  Filled 2025-01-12: qty 1

## 2025-01-12 NOTE — Plan of Care (Signed)
   Problem: Education: Goal: Knowledge of General Education information will improve Description: Including pain rating scale, medication(s)/side effects and non-pharmacologic comfort measures Outcome: Progressing   Problem: Activity: Goal: Risk for activity intolerance will decrease Outcome: Progressing   Problem: Coping: Goal: Level of anxiety will decrease Outcome: Progressing

## 2025-01-12 NOTE — Progress Notes (Signed)
 " PROGRESS NOTE    Maurice Wood  FMW:[id number] DOB: 1933/08/17 DOA: 01/08/2025 PCP: Seabron Alm, MD   Brief Narrative:  This 89 y.o. male with medical history significant for permanent A-fib on Eliquis , chronic hypoxic respiratory failure on 2 L due to COPD, heart failure with preserved EF who is being admitted to the hospital with acute on chronic hypoxic respiratory failure due to acute on chronic heart failure with preserved EF.  Patient and family reports over the last few days,  he has had increased lower extremity edema, orthopnea.  He regularly follows up with advanced heart failure team at [facility] and he is on Lasix  40 mg once weekly he has been compliant with this medication.  He was just discharged from hospital on 1/22 after hospitalization for incarcerated inguinal hernia repair.  Home health nurse came to visit him today and noticed that he was saturating in low 80s and noted that his home oxygen  was not working.  Patient was sent in the ED and he was initially wheezing and was given IV Solu-Medrol  and breathing treatment as well as IV Lasix  due to the evidence of heart failure.  Patient was admitted for acute on chronic hypoxic respiratory failure likely due to acute on chronic diastolic CHF.  Assessment & Plan:   Principal Problem:   Acute on chronic respiratory failure with hypoxia (HCC)   Acute on chronic hypoxic respiratory failure: Suspect this is mainly due to exacerbation of  heart failure, as he denies any recent cough or wheezing.   However He was initially wheezing when seen by his home health nurse. Continue supplemental oxygen  and wean as tolerated. Continue management of COPD and CHF exacerbation. He is currently requiring 4 L of supplemental oxygen  saturation 93%.   Acute on chronic heart failure with preserved EF: He is followed by [facility] health cardiology heart failure team, at baseline he is on 40 mg Lasix  once weekly and states he has been compliant with this.   Heart failure may have been exacerbated by his recent hospitalization and surgery, though he was noted to be euvolemic at the time of discharge last week.  Last echo 03/22/2024 with preserved EF and diastolic function not calculated. Continue fluid restriction.  Low-salt diet. He has received IV Lasix  in the emergency department, will give Lasix  40 IV once today. Further dosing per clinical response. -4.5 L negative balance since admission.   Intake/Output Summary (Last 24 hours) at 01/12/2025 1158 Last data filed at 01/12/2025 0956 Gross per 24 hour  Intake 120 ml  Output 1025 ml  Net -905 ml      COPD: He remains on chronic 2 L nasal cannula oxygen , now on more oxygen  as above.   Given no wheezing on exam , No significant cough or dyspnea, I doubt that he has an ongoing acute exacerbation of his COPD.   He has received a dose of IV Solu-Medrol  in the emergency department. Continue Breztri  twice daily Continue supplemental oxygen  as above Continue Albuterol  nebulizer as needed.   OSA-CPAP nightly   Permanent atrial fibrillation: Heart rate is well-controlled  Continue Eliquis    BPH: Continue Flomax .   DVT prophylaxis: Eliquis  Code Status: Full code Family Communication: Wife at bed side Disposition Plan:     Status is: Inpatient Remains inpatient appropriate because: Patient admitted for acute on chronic hypoxic respiratory failure due to acute on chronic diastolic CHF requiring IV diuresis.  He still requires IV diuresis. Patient is not medically ready for discharge yet.  Needs PT/OT Evaluation   Consultants:  None  Procedures: None  Antimicrobials:  Anti-infectives (From admission, onward)    None      Subjective: Patient was seen and examined at bedside.  Overnight events noted. Patient seems slightly better , states still feels short of breath with exertion. He has Significant bilateral leg swelling.   Objective: Vitals:   01/11/25 2109 01/12/25 0437  01/12/25 0825 01/12/25 1048  BP:  106/70  103/64  Pulse: 83 77  69  Resp: 16 14  16   Temp:  98 F (36.7 C)  97.6 F (36.4 C)  TempSrc:  Oral  Oral  SpO2: 94% 96% 97% 95%  Weight:      Height:        Intake/Output Summary (Last 24 hours) at 01/12/2025 1158 Last data filed at 01/12/2025 0956 Gross per 24 hour  Intake 120 ml  Output 1025 ml  Net -905 ml   Filed Weights   01/11/25 0546  Weight: 72.5 kg    Examination:  General exam: Appears calm and comfortable, deconditioned, not in any acute distress. Respiratory system: Decreased breath sounds. Respiratory effort normal.  RR 15 Cardiovascular system: S1 & S2 heard, RRR. No JVD, murmurs, rubs, gallops or clicks. Gastrointestinal system: Abdomen is non distended, soft and non tender. Normal bowel sounds heard. Central nervous system: Alert and oriented x 3. No focal neurological deficits. Extremities: Edema ++, no cyanosis, no clubbing. Psychiatry: Judgement and insight appear normal. Mood & affect appropriate.    Data Reviewed: I have personally reviewed following labs and imaging studies  CBC: Recent Labs  Lab 01/08/25 1236 01/09/25 0742 01/12/25 0605  WBC 7.4 4.5 5.9  NEUTROABS 6.1  --   --   HGB 11.1* 11.1* 10.3*  HCT 35.0* 36.9* 33.5*  MCV 91.4 93.2 91.8  PLT 233 227 207   Basic Metabolic Panel: Recent Labs  Lab 01/08/25 1236 01/09/25 0742 01/12/25 0605  NA 139 139 138  K 4.0 4.1 3.4*  CL 105 102 99  CO2 24 24 31   GLUCOSE 155* 169* 113*  BUN 12 14 14   CREATININE 0.85 1.03 0.81  CALCIUM  8.6* 9.1 8.7*  MG  --   --  1.8  PHOS  --   --  2.8   GFR: Estimated Creatinine Clearance: 63.4 mL/min (by C-G formula based on SCr of 0.81 mg/dL). Liver Function Tests: No results for input(s): AST, ALT, ALKPHOS, BILITOT, PROT, ALBUMIN in the last 168 hours. No results for input(s): LIPASE, AMYLASE in the last 168 hours. No results for input(s): AMMONIA in the last 168 hours. Coagulation  Profile: No results for input(s): INR, PROTIME in the last 168 hours. Cardiac Enzymes: No results for input(s): CKTOTAL, CKMB, CKMBINDEX, TROPONINI in the last 168 hours. BNP (last 3 results) Recent Labs    01/08/25 1236 01/12/25 0605  PROBNP 3,199.0* 1,129.0*   HbA1C: No results for input(s): HGBA1C in the last 72 hours. CBG: No results for input(s): GLUCAP in the last 168 hours. Lipid Profile: No results for input(s): CHOL, HDL, LDLCALC, TRIG, CHOLHDL, LDLDIRECT in the last 72 hours. Thyroid  Function Tests: No results for input(s): TSH, T4TOTAL, FREET4, T3FREE, THYROIDAB in the last 72 hours. Anemia Panel: No results for input(s): VITAMINB12, FOLATE, FERRITIN, TIBC, IRON, RETICCTPCT in the last 72 hours. Sepsis Labs: No results for input(s): PROCALCITON, LATICACIDVEN in the last 168 hours.  Recent Results (from the past 240 hours)  Resp panel by RT-PCR (RSV, Flu A&B, Covid) Anterior Nasal Swab  Status: None   Collection Time: 01/08/25 12:36 PM   Specimen: Anterior Nasal Swab  Result Value Ref Range Status   SARS Coronavirus 2 by RT PCR NEGATIVE NEGATIVE Final    Comment: (NOTE) SARS-CoV-2 target nucleic acids are NOT DETECTED.  The SARS-CoV-2 RNA is generally detectable in upper respiratory specimens during the acute phase of infection. The lowest concentration of SARS-CoV-2 viral copies this assay can detect is 138 copies/mL. A negative result does not preclude SARS-Cov-2 infection and should not be used as the sole basis for treatment or other patient management decisions. A negative result may occur with  improper specimen collection/handling, submission of specimen other than nasopharyngeal swab, presence of viral mutation(s) within the areas targeted by this assay, and inadequate number of viral copies(<138 copies/mL). A negative result must be combined with clinical observations, patient history, and  epidemiological information. The expected result is Negative.  Fact Sheet for Patients:  bloggercourse.com  Fact Sheet for Healthcare Providers:  seriousbroker.it  This test is no t yet approved or cleared by the [place]  FDA and  has been authorized for detection and/or diagnosis of SARS-CoV-2 by FDA under an Emergency Use Authorization (EUA). This EUA will remain  in effect (meaning this test can be used) for the duration of the COVID-19 declaration under Section 564(b)(1) of the Act, 21 U.S.C.section 360bbb-3(b)(1), unless the authorization is terminated  or revoked sooner.       Influenza A by PCR NEGATIVE NEGATIVE Final   Influenza B by PCR NEGATIVE NEGATIVE Final    Comment: (NOTE) The Xpert Xpress SARS-CoV-2/FLU/RSV plus assay is intended as an aid in the diagnosis of influenza from Nasopharyngeal swab specimens and should not be used as a sole basis for treatment. Nasal washings and aspirates are unacceptable for Xpert Xpress SARS-CoV-2/FLU/RSV testing.  Fact Sheet for Patients: bloggercourse.com  Fact Sheet for Healthcare Providers: seriousbroker.it  This test is not yet approved or cleared by the [place]  FDA and has been authorized for detection and/or diagnosis of SARS-CoV-2 by FDA under an Emergency Use Authorization (EUA). This EUA will remain in effect (meaning this test can be used) for the duration of the COVID-19 declaration under Section 564(b)(1) of the Act, 21 U.S.C. section 360bbb-3(b)(1), unless the authorization is terminated or revoked.     Resp Syncytial Virus by PCR NEGATIVE NEGATIVE Final    Comment: (NOTE) Fact Sheet for Patients: bloggercourse.com  Fact Sheet for Healthcare Providers: seriousbroker.it  This test is not yet approved or cleared by the [place]  FDA and has been  authorized for detection and/or diagnosis of SARS-CoV-2 by FDA under an Emergency Use Authorization (EUA). This EUA will remain in effect (meaning this test can be used) for the duration of the COVID-19 declaration under Section 564(b)(1) of the Act, 21 U.S.C. section 360bbb-3(b)(1), unless the authorization is terminated or revoked.  Performed at [facility], [address] [place], [place] [address]    Radiology Studies: No results found.  Scheduled Meds:  apixaban   5 mg Oral BID   budesonide -glycopyrrolate -formoterol   2 puff Inhalation BID   furosemide   40 mg Intravenous Once   tamsulosin   0.4 mg Oral Daily   Continuous Infusions:   LOS: 4 days    Time spent: 35 mins    Darcel Dawley, MD [facility]   If 7PM-7AM, please contact night-coverage  "

## 2025-01-12 NOTE — Progress Notes (Addendum)
 Pt reprts CP as resolved, denies worsening SOB, dizziness or weakness.   01/12/25 1524  Vitals  BP 102/65  MAP (mmHg) 79  BP Location Right Arm  BP Method Automatic  Patient Position (if appropriate) Lying  Pulse Rate 91  MEWS COLOR  MEWS Score Color Green  MEWS Score  MEWS Temp 0  MEWS Systolic 0  MEWS Pulse 0  MEWS RR 0  MEWS LOC 0  MEWS Score 0

## 2025-01-12 NOTE — Progress Notes (Signed)
" °   01/12/25 2123  BiPAP/CPAP/SIPAP  BiPAP/CPAP/SIPAP Pt Type Adult  BiPAP/CPAP/SIPAP Resmed  Mask Type Full face mask  Dentures removed? Not applicable  Mask Size Large  Respiratory Rate 15 breaths/min  Flow Rate 4 lpm (bled into circuit)  Patient Home Machine No  Patient Home Mask No  Patient Home Tubing No  Auto Titrate Yes  Minimum cmH2O 5 cmH2O  Maximum cmH2O 15 cmH2O  CPAP/SIPAP surface wiped down Yes  Device Plugged into RED Power Outlet Yes    "

## 2025-01-12 NOTE — Progress Notes (Signed)
 Patient c/o mid sternal CP 8/10 described as sudden pressure and heaviness. EKG completed, provider aware now at bedside. nitroglycerin  x1  01/12/25 1510  Vitals  BP 117/79  MAP (mmHg) 92  BP Location Right Arm  BP Method Automatic  Pulse Rate 81  MEWS COLOR  MEWS Score Color Green  MEWS Score  MEWS Temp 0  MEWS Systolic 0  MEWS Pulse 0  MEWS RR 0  MEWS LOC 0  MEWS Score 0

## 2025-01-13 ENCOUNTER — Inpatient Hospital Stay (HOSPITAL_COMMUNITY)

## 2025-01-13 ENCOUNTER — Encounter (HOSPITAL_COMMUNITY): Payer: Self-pay | Admitting: Internal Medicine

## 2025-01-13 DIAGNOSIS — J9621 Acute and chronic respiratory failure with hypoxia: Secondary | ICD-10-CM | POA: Diagnosis not present

## 2025-01-13 DIAGNOSIS — I5033 Acute on chronic diastolic (congestive) heart failure: Secondary | ICD-10-CM | POA: Diagnosis not present

## 2025-01-13 LAB — ECHOCARDIOGRAM COMPLETE
AR max vel: 2.12 cm2
AV Area VTI: 2.13 cm2
AV Area mean vel: 2.18 cm2
AV Mean grad: 8 mmHg
AV Peak grad: 14.6 mmHg
Ao pk vel: 1.91 m/s
Area-P 1/2: 2.6 cm2
Height: 70 in
S' Lateral: 2.8 cm
Weight: 2443.2 [oz_av]

## 2025-01-13 LAB — COMPREHENSIVE METABOLIC PANEL WITH GFR
ALT: 19 U/L (ref 0–44)
AST: 29 U/L (ref 15–41)
Albumin: 3.2 g/dL — ABNORMAL LOW (ref 3.5–5.0)
Alkaline Phosphatase: 99 U/L (ref 38–126)
Anion gap: 13 (ref 5–15)
BUN: 12 mg/dL (ref 8–23)
CO2: 27 mmol/L (ref 22–32)
Calcium: 8.9 mg/dL (ref 8.9–10.3)
Chloride: 99 mmol/L (ref 98–111)
Creatinine, Ser: 0.91 mg/dL (ref 0.61–1.24)
GFR, Estimated: >60 mL/min
Glucose, Bld: 192 mg/dL — ABNORMAL HIGH (ref 70–99)
Potassium: 3.4 mmol/L — ABNORMAL LOW (ref 3.5–5.1)
Sodium: 138 mmol/L (ref 135–145)
Total Bilirubin: 1.1 mg/dL (ref 0.0–1.2)
Total Protein: 6.3 g/dL — ABNORMAL LOW (ref 6.5–8.1)

## 2025-01-13 LAB — MAGNESIUM: Magnesium: 1.7 mg/dL (ref 1.7–2.4)

## 2025-01-13 LAB — CBC
HCT: 37.5 % — ABNORMAL LOW (ref 39.0–52.0)
Hemoglobin: 11.3 g/dL — ABNORMAL LOW (ref 13.0–17.0)
MCH: 27.9 pg (ref 26.0–34.0)
MCHC: 30.1 g/dL (ref 30.0–36.0)
MCV: 92.6 fL (ref 80.0–100.0)
Platelets: 201 10*3/uL (ref 150–400)
RBC: 4.05 MIL/uL — ABNORMAL LOW (ref 4.22–5.81)
RDW: 14.3 % (ref 11.5–15.5)
WBC: 7 10*3/uL (ref 4.0–10.5)
nRBC: 0 % (ref 0.0–0.2)

## 2025-01-13 LAB — TROPONIN T, HIGH SENSITIVITY
Troponin T High Sensitivity: 46 ng/L — ABNORMAL HIGH (ref 0–19)
Troponin T High Sensitivity: 48 ng/L — ABNORMAL HIGH (ref 0–19)

## 2025-01-13 MED ORDER — NITROGLYCERIN 0.4 MG SL SUBL
0.4000 mg | SUBLINGUAL_TABLET | SUBLINGUAL | Status: AC | PRN
  Administered 2025-01-13: 0.4 mg via SUBLINGUAL
  Filled 2025-01-13: qty 1

## 2025-01-13 MED ORDER — SODIUM CHLORIDE 0.9 % IV BOLUS
500.0000 mL | Freq: Once | INTRAVENOUS | Status: AC | PRN
  Administered 2025-01-13: 500 mL via INTRAVENOUS

## 2025-01-13 MED ORDER — ALUM & MAG HYDROXIDE-SIMETH 200-200-20 MG/5ML PO SUSP
30.0000 mL | Freq: Four times a day (QID) | ORAL | Status: AC | PRN
  Administered 2025-01-14 – 2025-01-15 (×3): 30 mL via ORAL
  Filled 2025-01-13 (×4): qty 30

## 2025-01-13 MED ORDER — FUROSEMIDE 10 MG/ML IJ SOLN
40.0000 mg | Freq: Once | INTRAMUSCULAR | Status: AC
  Administered 2025-01-13: 40 mg via INTRAVENOUS
  Filled 2025-01-13: qty 4

## 2025-01-13 MED ORDER — NITROGLYCERIN 0.4 MG SL SUBL
SUBLINGUAL_TABLET | SUBLINGUAL | Status: AC
  Filled 2025-01-13: qty 1

## 2025-01-13 MED ORDER — SPIRONOLACTONE 12.5 MG HALF TABLET
12.5000 mg | ORAL_TABLET | Freq: Every day | ORAL | Status: AC
  Administered 2025-01-13 – 2025-01-15 (×3): 12.5 mg via ORAL
  Filled 2025-01-13 (×3): qty 1

## 2025-01-13 MED ORDER — POTASSIUM CHLORIDE CRYS ER 20 MEQ PO TBCR
40.0000 meq | EXTENDED_RELEASE_TABLET | Freq: Once | ORAL | Status: AC
  Administered 2025-01-13: 40 meq via ORAL
  Filled 2025-01-13: qty 2

## 2025-01-13 MED ORDER — NITROGLYCERIN 0.4 MG SL SUBL
SUBLINGUAL_TABLET | SUBLINGUAL | Status: AC
  Administered 2025-01-13: 0.4 mg via SUBLINGUAL
  Filled 2025-01-13: qty 1

## 2025-01-13 NOTE — Progress Notes (Signed)
 Mobility Specialist - Progress Note  ([place] 4L) Pre-mobility: 73 bpm HR, 97% SpO2 During mobility: 111 bpm HR, 88% SpO2 Post-mobility: 84 bpm HR, 94% SPO2   01/13/25 1146  Mobility  Activity Ambulated with assistance  Level of Assistance Contact guard assist, steadying assist  Assistive Device Front wheel walker  Distance Ambulated (ft) 150 ft  Range of Motion/Exercises Active  Activity Response Tolerated fair  Mobility visit 1 Mobility  Mobility Specialist Start Time (ACUTE ONLY) 1130  Mobility Specialist Stop Time (ACUTE ONLY) 1145  Mobility Specialist Time Calculation (min) (ACUTE ONLY) 15 min   Pt was found in bed and agreeable to mobilize. C/o SOB with ambulation. Upon returning to room SPO2 decreased to 88% and able to increase >90% within 1 min. At EOS returned to recliner chair with all needs met. Call bell in reach and wife in room. RN notified.   Maurice Wood,  Mobility Specialist Can be reached via Secure Chat

## 2025-01-13 NOTE — Progress Notes (Signed)
" ° ° ° °  Patient Name: Maurice Wood           DOB: 1933-02-01  MRN: [id number]      Admission Date: 01/08/2025  Attending Provider: Leotis Bogus, MD  Primary Diagnosis: Acute on chronic respiratory failure with hypoxia (HCC)   Level of care: Telemetry   OVERNIGHT EVENT  Rapid Response is requesting I reassess the patient.  Prior to my arrival, the patient reported chest pain, which resolved after receiving two doses of sublingual nitroglycerin . Following nitroglycerin  administration, the patient became hypotensive (SBP 70-80's) and complained of blurry vision. Oxygen  saturation was noted to be in the 80s, and the patient was placed on a non-rebreather mask to maintain SPO2 > 92.  Given symptomatic hypotension, the Rapid Response RN initiated a 500 mL IV fluid bolus and placed the patient in the Trendelenburg position. After receiving 250 mL of fluids, blood pressure improved SBP 100's. The remaining 250 cc fluid was HELD.   At the time of my evaluation, the patient denied chest pain, dizziness, or lightheadedness. Oxygen  support was subsequently weaned to 4 L via nasal cannula. BS diminished, even.  No accessory muscle use.    Plan: EKG -- atrial fibrillation without acute ST-segment changes.  Troponin  Antiacid as needed   Addendum:  2030- BP remains stable, no further acute changes. Flat troponin.    Dahmir Epperly, DNP, ACNPC- AG [facility] [place]    "

## 2025-01-13 NOTE — Plan of Care (Signed)
   Problem: Education: Goal: Knowledge of General Education information will improve Description: Including pain rating scale, medication(s)/side effects and non-pharmacologic comfort measures Outcome: Progressing   Problem: Activity: Goal: Risk for activity intolerance will decrease Outcome: Progressing   Problem: Nutrition: Goal: Adequate nutrition will be maintained Outcome: Progressing

## 2025-01-13 NOTE — Progress Notes (Signed)
 " PROGRESS NOTE    Maurice Wood  FMW:[id number] DOB: [birth year] DOA: 01/08/2025 PCP: Seabron Alm, MD   Brief Narrative:  This 89 y.o. male with medical history significant for permanent A-fib on Eliquis , chronic hypoxic respiratory failure on 2 L due to COPD, heart failure with preserved EF who is being admitted to the hospital with acute on chronic hypoxic respiratory failure due to acute on chronic heart failure with preserved EF.  Patient and family reports over the last few days,  he has had increased lower extremity edema, orthopnea.  He regularly follows up with advanced heart failure team at [facility] and he is on Lasix  40 mg once weekly he has been compliant with this medication.  He was just discharged from hospital on 1/22 after hospitalization for incarcerated inguinal hernia repair.  Home health nurse came to visit him today and noticed that he was saturating in low 80s and noted that his home oxygen  was not working.  Patient was sent in the ED and he was initially wheezing and was given IV Solu-Medrol  and breathing treatment as well as IV Lasix  due to the evidence of heart failure.  Patient was admitted for acute on chronic hypoxic respiratory failure likely due to acute on chronic diastolic CHF.  Assessment & Plan:   Principal Problem:   Acute on chronic respiratory failure with hypoxia (HCC)   Acute on chronic hypoxic respiratory failure: Suspect this is mainly due to exacerbation of  heart failure, as he denies any recent cough or wheezing.   However He was initially wheezing when seen by his home health nurse. Continue supplemental oxygen  and wean as tolerated. Continue management of COPD and CHF exacerbation. He is currently requiring 4 L of supplemental oxygen  saturation 93%.   Acute on chronic heart failure with preserved EF: He is followed by [facility] health cardiology heart failure team, at baseline he is on 40 mg Lasix  once weekly and states he has been compliant with this.   Heart failure may have been exacerbated by his recent hospitalization and surgery, though he was noted to be euvolemic at the time of discharge last week.  Last echo 03/22/2024 with preserved EF and diastolic function not calculated. Continue fluid restriction.  Low-salt diet. He has received IV Lasix  in the emergency department, will hold further lasix  for today. Further dosing per clinical response. -4.5 L negative balance since admission.   Intake/Output Summary (Last 24 hours) at 01/13/2025 1327 Last data filed at 01/13/2025 1229 Gross per 24 hour  Intake 480 ml  Output 950 ml  Net -470 ml      COPD: He remains on chronic 2 L nasal cannula oxygen , now on more oxygen  as above.   Given no wheezing on exam , No significant cough or dyspnea, I doubt that he has an ongoing acute exacerbation of his COPD.   He has received a dose of IV Solu-Medrol  in the emergency department. Continue Breztri  twice daily Continue supplemental oxygen  as above Continue Albuterol  nebulizer as needed.   OSA-CPAP nightly.   Permanent atrial fibrillation: Heart rate is well-controlled  Continue Eliquis .   BPH: Continue Flomax .   DVT prophylaxis: Eliquis  Code Status: Full code Family Communication: Wife at bed side Disposition Plan:     Status is: Inpatient Remains inpatient appropriate because: Patient admitted for acute on chronic hypoxic respiratory failure due to acute on chronic diastolic CHF requiring IV diuresis.  He still requires IV diuresis.  Patient is not medically ready for discharge yet.  Needs PT/OT Evaluation   Consultants:  Cardiology  Procedures: None  Antimicrobials:  Anti-infectives (From admission, onward)    None      Subjective: Patient was seen and examined at bedside.  Overnight events noted. Patient seems slightly better , states still feels short of breath with exertion. He has Significant bilateral leg swelling.   Objective: Vitals:   01/13/25 0722 01/13/25  0752 01/13/25 0819 01/13/25 1315  BP:    100/69  Pulse:    80  Resp:    15  Temp:    98.2 F (36.8 C)  TempSrc:      SpO2: 96% 94%  94%  Weight:   69.3 kg   Height:        Intake/Output Summary (Last 24 hours) at 01/13/2025 1327 Last data filed at 01/13/2025 1229 Gross per 24 hour  Intake 480 ml  Output 950 ml  Net -470 ml   Filed Weights   01/11/25 0546 01/13/25 0819  Weight: 72.5 kg 69.3 kg    Examination:  General exam: Appears calm and comfortable, deconditioned, not in any acute distress. Respiratory system: Decreased breath sounds. Respiratory effort normal.  RR 14 Cardiovascular system: S1 & S2 heard, RRR. No JVD, murmurs, rubs, gallops or clicks. Gastrointestinal system: Abdomen is non distended, soft and non tender. Normal bowel sounds heard. Central nervous system: Alert and oriented x 3. No focal neurological deficits. Extremities: Edema ++, no cyanosis, no clubbing. Psychiatry: Judgement and insight appear normal. Mood & affect appropriate.    Data Reviewed: I have personally reviewed following labs and imaging studies  CBC: Recent Labs  Lab 01/08/25 1236 01/09/25 0742 01/12/25 0605 01/13/25 0845  WBC 7.4 4.5 5.9 7.0  NEUTROABS 6.1  --   --   --   HGB 11.1* 11.1* 10.3* 11.3*  HCT 35.0* 36.9* 33.5* 37.5*  MCV 91.4 93.2 91.8 92.6  PLT 233 227 207 201   Basic Metabolic Panel: Recent Labs  Lab 01/08/25 1236 01/09/25 0742 01/12/25 0605 01/13/25 0845  NA 139 139 138 138  K 4.0 4.1 3.4* 3.4*  CL 105 102 99 99  CO2 24 24 31 27   GLUCOSE 155* 169* 113* 192*  BUN 12 14 14 12   CREATININE 0.85 1.03 0.81 0.91  CALCIUM  8.6* 9.1 8.7* 8.9  MG  --   --  1.8 1.7  PHOS  --   --  2.8  --    GFR: Estimated Creatinine Clearance: 53.9 mL/min (by C-G formula based on SCr of 0.91 mg/dL). Liver Function Tests: Recent Labs  Lab 01/13/25 0845  AST 29  ALT 19  ALKPHOS 99  BILITOT 1.1  PROT 6.3*  ALBUMIN 3.2*   No results for input(s): LIPASE, AMYLASE in  the last 168 hours. No results for input(s): AMMONIA in the last 168 hours. Coagulation Profile: No results for input(s): INR, PROTIME in the last 168 hours. Cardiac Enzymes: No results for input(s): CKTOTAL, CKMB, CKMBINDEX, TROPONINI in the last 168 hours. BNP (last 3 results) Recent Labs    01/08/25 1236 01/12/25 0605  PROBNP 3,199.0* 1,129.0*   HbA1C: No results for input(s): HGBA1C in the last 72 hours. CBG: No results for input(s): GLUCAP in the last 168 hours. Lipid Profile: No results for input(s): CHOL, HDL, LDLCALC, TRIG, CHOLHDL, LDLDIRECT in the last 72 hours. Thyroid  Function Tests: No results for input(s): TSH, T4TOTAL, FREET4, T3FREE, THYROIDAB in the last 72 hours. Anemia Panel: No results for input(s): VITAMINB12, FOLATE, FERRITIN, TIBC, IRON, RETICCTPCT in  the last 72 hours. Sepsis Labs: No results for input(s): PROCALCITON, LATICACIDVEN in the last 168 hours.  Recent Results (from the past 240 hours)  Resp panel by RT-PCR (RSV, Flu A&B, Covid) Anterior Nasal Swab     Status: None   Collection Time: 01/08/25 12:36 PM   Specimen: Anterior Nasal Swab  Result Value Ref Range Status   SARS Coronavirus 2 by RT PCR NEGATIVE NEGATIVE Final    Comment: (NOTE) SARS-CoV-2 target nucleic acids are NOT DETECTED.  The SARS-CoV-2 RNA is generally detectable in upper respiratory specimens during the acute phase of infection. The lowest concentration of SARS-CoV-2 viral copies this assay can detect is 138 copies/mL. A negative result does not preclude SARS-Cov-2 infection and should not be used as the sole basis for treatment or other patient management decisions. A negative result may occur with  improper specimen collection/handling, submission of specimen other than nasopharyngeal swab, presence of viral mutation(s) within the areas targeted by this assay, and inadequate number of viral copies(<138 copies/mL). A  negative result must be combined with clinical observations, patient history, and epidemiological information. The expected result is Negative.  Fact Sheet for Patients:  bloggercourse.com  Fact Sheet for Healthcare Providers:  seriousbroker.it  This test is no t yet approved or cleared by the [place]  FDA and  has been authorized for detection and/or diagnosis of SARS-CoV-2 by FDA under an Emergency Use Authorization (EUA). This EUA will remain  in effect (meaning this test can be used) for the duration of the COVID-19 declaration under Section 564(b)(1) of the Act, 21 U.S.C.section 360bbb-3(b)(1), unless the authorization is terminated  or revoked sooner.       Influenza A by PCR NEGATIVE NEGATIVE Final   Influenza B by PCR NEGATIVE NEGATIVE Final    Comment: (NOTE) The Xpert Xpress SARS-CoV-2/FLU/RSV plus assay is intended as an aid in the diagnosis of influenza from Nasopharyngeal swab specimens and should not be used as a sole basis for treatment. Nasal washings and aspirates are unacceptable for Xpert Xpress SARS-CoV-2/FLU/RSV testing.  Fact Sheet for Patients: bloggercourse.com  Fact Sheet for Healthcare Providers: seriousbroker.it  This test is not yet approved or cleared by the [place]  FDA and has been authorized for detection and/or diagnosis of SARS-CoV-2 by FDA under an Emergency Use Authorization (EUA). This EUA will remain in effect (meaning this test can be used) for the duration of the COVID-19 declaration under Section 564(b)(1) of the Act, 21 U.S.C. section 360bbb-3(b)(1), unless the authorization is terminated or revoked.     Resp Syncytial Virus by PCR NEGATIVE NEGATIVE Final    Comment: (NOTE) Fact Sheet for Patients: bloggercourse.com  Fact Sheet for Healthcare  Providers: seriousbroker.it  This test is not yet approved or cleared by the [place]  FDA and has been authorized for detection and/or diagnosis of SARS-CoV-2 by FDA under an Emergency Use Authorization (EUA). This EUA will remain in effect (meaning this test can be used) for the duration of the COVID-19 declaration under Section 564(b)(1) of the Act, 21 U.S.C. section 360bbb-3(b)(1), unless the authorization is terminated or revoked.  Performed at [facility], [address], [place], [place] [address]    Radiology Studies: No results found.  Scheduled Meds:  apixaban   5 mg Oral BID   budesonide -glycopyrrolate -formoterol   2 puff Inhalation BID   tamsulosin   0.4 mg Oral Daily   Continuous Infusions:   LOS: 5 days    Time spent: 35 mins    Darcel Dawley, MD  [facility]   If 7PM-7AM, please contact night-coverage  "

## 2025-01-13 NOTE — Progress Notes (Signed)
" °   01/13/25 2159  BiPAP/CPAP/SIPAP  BiPAP/CPAP/SIPAP Pt Type Adult  Reason BIPAP/CPAP not in use Non-compliant (Patient refused CPAP qhs.  He states that he does not want to wear it tonight.  I encouraged hime to contract RT should he change his mind at any point throughout the night.)    "

## 2025-01-13 NOTE — Plan of Care (Signed)

## 2025-01-13 NOTE — Consult Note (Addendum)
 "  Cardiology Consultation   Patient ID: Maurice Wood MRN: [id number]; DOB: 03-19-1933  Admit date: 01/08/2025 Date of Consult: 01/13/2025  PCP:  Seabron Alm, MD   [facility] Providers Cardiologist:  Toribio Fuel, MD        Patient Profile: Maurice Wood is a 89 y.o. male with a hx of permanent atrial fibrillation, OSA on CPAP followed by pulmonary, HTN, HLD, COPD, ILD with recent chronic respiratory failure requiring 2L home O2, mitral regurgitation, DM, IBS, small intracranial aneurysms with spontaneous dissection of the right internal carotid artery in 2002, moderately dilated aortic root by echo 03/2024, ?retinal vein occlusion per PMH, chorioretinal inflammation of right eye, idiopathic peripheral neuropathy, findings of cirrhotic liver contour/aortoiliac atherosclerosis/remote cerebellar infarcts by CT 09/2024, nephrolithiasis who is being seen 01/13/2025 for the evaluation of CHF at the request of Dr. Leotis.  History of Present Illness: Maurice Wood has followed with Dr. Fuel He had chest pain in 2011 s/p cath showing normal coronaries with EF 45% (?artificially low due to AF, normalized EF since that time). His atrial fibrillation is longstanding/permanent. R/LHC in 2021 showed normal coronaries, RCA not injected but small/nondominant on prior study, LAD with small prox aneurysmal segment otherwise normal, EF 65%, mild PAH with normal output. His valvular disease has previously been monitored by echo/TEE, with last echo 03/2024 showing EF 60-65%, severe LAE, moderate RAE, mild MR, aortic sclerosis without stenosis, moderate dilation of aortic root 47mm. Lasix  was decreased to 1x/week at f/u 10/2024 due to looking dry.   He was recently admitted 1/15-1/22/26 with incarcerated inguinal hernia with SBO requiring hernia repair. He also had issues with hypoxia requiring 2L home O2 at DC with activity, oliguria, urinary retention, electrolyte abnormalities, delirium, and AKI with peak  Cr 1.3. He was continued on Lasix  40mg  daily at DC. Eliquis  was interrupted due to need for surgery.  He returned to the hospital with worsening BLE edema and orthopnea. Home health had noted rhonchi, wheezing, and hypoxia in the low 80s despite 2L. His wife had recently been sick. He had had a mechanical fall as well. Labs showed BNP 3199, hsTroponins low/flat at 44-40-39-37, Hgb 10-11 range, d-dimer 1.34, CXR suggested cardiomegaly with asymmetric pulmonary edema suggestive for congestive heart failure, though also keeping in mind patient has known ILD by prior CT. He was diuresed with IV Lasix  40mg  daily. Not clear that I/Os are fully accurate, but charting -3.8L. He had episode of chest pain/heaviness yesterday in bed lasting 30 minutes, receiving 1 SL NTG, with EKG showing AF without acute ST changes and continued low/flat troponins. No recurrence, and none otherwise recently. He reports his breathing and edema have improved quite a bit. Still remains on O2.   Weights 1/30 - Admit weight unknown 2/2 - 159.83 (stable from 10/2024 but up from 151 on 1/15) -otherwise recent weights variable 150s-170s in 2025 2/4 - 152.7   Past Medical History:  Diagnosis Date   Allergy    rhinitis   Aortic atherosclerosis    Benign prostatic hypertrophy    Cerebellar infarction (HCC)    Cerebral aneurysm    followed by Dr Dolphus   CHF (congestive heart failure) (HCC)    Chronic heart failure with preserved ejection fraction (HFpEF) (HCC)    Chronic hypoxic respiratory failure (HCC)    COPD (chronic obstructive pulmonary disease) (HCC)    Dilated aortic root    Fatigue    History of chest pain    a. s/p LHC 05/2014  with normal cors   Hx of colonic polyps    diverticulosis   Hyperlipidemia    IBS (irritable bowel syndrome)    ILD (interstitial lung disease) (HCC)    Incontinence    Per pt 12/14/11   Kidney stones    Mitral regurgitation    Obesity    Obstructive sleep apnea    Permanent atrial  fibrillation (HCC)    Retinal vein occlusion (HCC)     Past Surgical History:  Procedure Laterality Date   CARDIAC CATHETERIZATION  1999   CHOLECYSTECTOMY     HERNIA REPAIR     inguinal and umbilical herniorrhaphy     INGUINAL HERNIA REPAIR Right 12/24/2024   Procedure: REPAIR, HERNIA, INGUINAL, ADULT WITH MESH, DIAGNOSTIC LAPAROSCOPY;  Surgeon: Ebbie Cough, MD;  Location: [facility];  Service: General;  Laterality: Right;   INTERSTIM IMPLANT PLACEMENT  04/2017   LEFT HEART CATHETERIZATION WITH CORONARY ANGIOGRAM N/A 06/01/2014   Procedure: LEFT HEART CATHETERIZATION WITH CORONARY ANGIOGRAM;  Surgeon: Peter M Jordan, MD;  Location: [facility] CATH LAB;  Service: Cardiovascular;  Laterality: N/A;   LITHOTRIPSY     RIGHT/LEFT HEART CATH AND CORONARY ANGIOGRAPHY N/A 07/08/2020   Procedure: RIGHT/LEFT HEART CATH AND CORONARY ANGIOGRAPHY;  Surgeon: Cherrie Toribio SAUNDERS, MD;  Location: [facility] INVASIVE CV LAB;  Service: Cardiovascular;  Laterality: N/A;   TEE WITHOUT CARDIOVERSION N/A 07/05/2020   Procedure: TRANSESOPHAGEAL ECHOCARDIOGRAM (TEE);  Surgeon: Cherrie Toribio SAUNDERS, MD;  Location: [facility] ENDOSCOPY;  Service: Cardiovascular;  Laterality: N/A;   TEE WITHOUT CARDIOVERSION N/A 05/21/2023   Procedure: TRANSESOPHAGEAL ECHOCARDIOGRAM;  Surgeon: Cherrie Toribio SAUNDERS, MD;  Location: [facility] INVASIVE CV LAB;  Service: Cardiovascular;  Laterality: N/A;   TONSILLECTOMY AND ADENOIDECTOMY  as a child     Home Medications:  Prior to Admission medications  Medication Sig Start Date End Date Taking? Authorizing Provider  albuterol  (VENTOLIN  HFA) 108 (90 Base) MCG/ACT inhaler Inhale 2 puffs into the lungs every 6 (six) hours as needed for wheezing or shortness of breath. 09/17/23  Yes Cobb, Comer GAILS, NP  cholecalciferol (VITAMIN D3) 25 MCG (1000 UNIT) tablet Take 1,000 Units by mouth daily.   Yes [provider]  ELIQUIS  5 MG TABS tablet TAKE 1 TABLET BY MOUTH TWICE A DAY 10/29/24  Yes [name], Toribio SAUNDERS, MD   fluticasone  (FLONASE ) 50 MCG/ACT nasal spray Place 1 spray into both nostrils 2 (two) times daily as needed for rhinitis or allergies.   Yes [provider]  Fluticasone -Umeclidin-Vilant (TRELEGY ELLIPTA ) 100-62.5-25 MCG/ACT AEPB INHALE 1 PUFF INTO THE LUNGS DAILY IN THE AFTERNOON 08/28/23  Yes Sood, Vineet, MD  furosemide  (LASIX ) 40 MG tablet Take 1 tablet (40 mg total) by mouth once a week. ONLY ON MONDAY'S Patient taking differently: Take 40 mg by mouth every Monday. 10/22/24  Yes [name], Toribio SAUNDERS, MD  HYDROcodone -acetaminophen  (NORCO/VICODIN) 5-325 MG tablet Take 1 tablet by mouth every 4 (four) hours as needed. Patient taking differently: Take 1 tablet by mouth every 4 (four) hours as needed for severe pain (pain score 7-10). 10/18/24  Yes Dean Clarity, MD  nitroGLYCERIN  (NITROSTAT ) 0.4 MG SL tablet Place 1 tablet (0.4 mg total) under the tongue every 5 (five) minutes as needed for chest pain. 06/12/23 10/22/25 Yes Milford, Harlene HERO, FNP  ondansetron  (ZOFRAN -ODT) 4 MG disintegrating tablet Take 1 tablet (4 mg total) by mouth every 8 (eight) hours as needed for nausea or vomiting. Patient taking differently: Take 4 mg by mouth every 8 (eight) hours as needed for nausea  or vomiting (dissolve orally). 10/18/24  Yes Dean Clarity, MD  potassium chloride  SA (KLOR-CON  M) 20 MEQ tablet Take 2 tablets (40 mEq total) by mouth 2 (two) times a week. Every Mon and Fri Patient taking differently: Take 40 mEq by mouth See admin instructions. Take 40 mEq by mouth on Mondays and Fridays 06/01/24  Yes [name], Toribio SAUNDERS, MD  PRILOSEC OTC 20 MG tablet Take 20 mg by mouth daily as needed (for acid reflux).   Yes [provider]  tamsulosin  (FLOMAX ) 0.4 MG CAPS capsule Take 1 capsule (0.4 mg total) by mouth daily. 12/31/24 01/30/25 Yes Perri DELENA Meliton Mickey., MD  TYLENOL  500 MG tablet Take 500 mg by mouth every 6 (six) hours as needed for mild pain (pain score 1-3) (or headaches).   Yes [provider]    Scheduled Meds:  apixaban   5 mg Oral BID   budesonide -glycopyrrolate -formoterol   2 puff Inhalation BID   tamsulosin   0.4 mg Oral Daily   Continuous Infusions:  PRN Meds: acetaminophen  **OR** acetaminophen , guaiFENesin -dextromethorphan , metoCLOPramide  (REGLAN ) injection, traZODone   Allergies:   Allergies[1]  Social History:   Social History   Socioeconomic History   Marital status: Married    Spouse name: Maurice Wood   Number of children: 2   Years of education: college   Highest education level: Not on file  Occupational History    Employer: RETIRED    Comment: retired  Tobacco Use   Smoking status: Former    Current packs/day: 0.00    Average packs/day: 1 pack/day for 20.0 years (20.0 ttl pk-yrs)    Types: Cigarettes    Start date: 12/11/1951    Quit date: 12/11/1971    Years since quitting: 53.1   Smokeless tobacco: Never  Vaping Use   Vaping status: Never Used  Substance and Sexual Activity   Alcohol use: Yes    Alcohol/week: 1.0 standard drink of alcohol    Types: 1 Cans of beer per week    Comment: Twice a month   Drug use: No   Sexual activity: Not on file  Other Topics Concern   Not on file  Social History Narrative   Patient lives at home with his wife Maurice Wood)   Retired   Programme Researcher, Broadcasting/film/video- college   Right handed.   Caffeine-  Two cups coffee daily.   Social Drivers of Health   Tobacco Use: Medium Risk (01/09/2025)   Patient History    Smoking Tobacco Use: Former    Smokeless Tobacco Use: Never    Passive Exposure: Not on Actuary Strain: Not on file  Food Insecurity: No Food Insecurity (01/08/2025)   Epic    Worried About Programme Researcher, Broadcasting/film/video in the Last Year: Never true    Ran Out of Food in the Last Year: Never true  Transportation Needs: No Transportation Needs (01/08/2025)   Epic    Lack of Transportation (Medical): No    Lack of Transportation (Non-Medical): No  Physical Activity: Not on file  Stress: Not  on file  Social Connections: Moderately Isolated (01/08/2025)   Social Connection and Isolation Panel    Frequency of Communication with Friends and Family: Once a week    Frequency of Social Gatherings with Friends and Family: Once a week    Attends Religious Services: More than 4 times per year    Active Member of [employer] or Organizations: No    Attends Banker Meetings: Never    Marital Status: Married  Intimate Partner Violence: Not At Risk (01/08/2025)   Epic    Fear of Current or Ex-Partner: No    Emotionally Abused: No    Physically Abused: No    Sexually Abused: No  Depression (PHQ2-9): Not on file  Alcohol Screen: Not on file  Housing: Low Risk (01/08/2025)   Epic    Unable to Pay for Housing in the Last Year: No    Number of Times Moved in the Last Year: 0    Homeless in the Last Year: No  Utilities: Not At Risk (01/08/2025)   Epic    Threatened with loss of utilities: No  Health Literacy: Not on file    Family History:    Family History  Problem Relation Age of Onset   Cancer Father        lung   Alcohol abuse Father    Aneurysm Father        Aortic aneurysm   Cancer Maternal Grandmother        colon   Sudden death Mother    Heart disease Paternal Grandfather    Diabetes Son    Diabetes Maternal Uncle    Diabetes Paternal Uncle      ROS:  Please see the history of present illness.  All other ROS reviewed and negative.     Physical Exam/Data: Vitals:   01/13/25 9287 01/13/25 0722 01/13/25 0752 01/13/25 0819  BP: 109/66     Pulse: 86     Resp: 18     Temp: 98 F (36.7 C)     TempSrc:      SpO2: 94% 96% 94%   Weight:    69.3 kg  Height:        Intake/Output Summary (Last 24 hours) at 01/13/2025 0954 Last data filed at 01/13/2025 0900 Gross per 24 hour  Intake 240 ml  Output 1250 ml  Net -1010 ml      01/13/2025    8:19 AM 01/11/2025    5:46 AM 12/24/2024    5:59 PM  Last 3 Weights  Weight (lbs) 152 lb 11.2 oz 159 lb 13.3 oz 151 lb 14.4  oz  Weight (kg) 69.264 kg 72.5 kg 68.9 kg     Body mass index is 21.91 kg/m.  General: Well developed, well nourished, in no acute distress Head: Normocephalic, atraumatic, sclera non-icteric, no xanthomas, nares are without discharge. Neck: Negative for carotid bruits. JVP not elevated. Lungs: Coarse BS bilaterally to auscultation without wheezes or rhonchi. Breathing is unlabored. Heart: RRR S1 S2 without murmurs, rubs, or gallops.  Abdomen: Soft, non-tender, non-distended with normoactive bowel sounds. No rebound/guarding. Extremities: No clubbing or cyanosis. Mild soft puffy edema. Distal pedal pulses are 2+ and equal bilaterally. Neuro: Alert and oriented X 3. Moves all extremities spontaneously. Psych:  Responds to questions appropriately with a normal affect.   EKG:  The EKG was personally reviewed and demonstrates:  01/08/25 atrial fibrillation with baseline wander, low voltage QRS, nonspecific STTW changes, otherwise challenging to interpret 01/13/25 atrial fibrillation 82bpm, low voltage QRS, one PVC, nonspecific STTW changes not significantly changed from prior Telemetry:  Telemetry was personally reviewed and demonstrates:  AF CVR. Mild bradycardia upper 40s during sleeping hours likely r/t OSA  Relevant CV Studies: 2d echo 03/2024    1. Left ventricular ejection fraction, by estimation, is 60 to 65%. The  left ventricle has normal function. The left ventricle has no regional  wall motion abnormalities. Left ventricular diastolic function could not  be  evaluated.   2. Right ventricular systolic function is normal. The right ventricular  size is normal. There is normal pulmonary artery systolic pressure. The  estimated right ventricular systolic pressure is 29.2 mmHg.   3. Left atrial size was severely dilated.   4. Right atrial size was moderately dilated.   5. The mitral valve is normal in structure. Mild mitral valve  regurgitation. No evidence of mitral stenosis. Moderate  mitral annular  calcification.   6. The aortic valve is tricuspid. There is mild calcification of the  aortic valve. There is mild thickening of the aortic valve. Aortic valve  regurgitation is not visualized. Aortic valve sclerosis is present, with  no evidence of aortic valve stenosis.   7. Aortic dilatation noted. There is moderate dilatation of the aortic  root, measuring 47 mm.   Comparison(s): No significant change from prior study. Prior images  reviewed side by side.   Laboratory Data: High Sensitivity Troponin:  No results for input(s): TROPONINIHS in the last 720 hours.  Recent Labs  Lab 01/08/25 1236 01/08/25 1521 01/12/25 1534 01/12/25 1701  TRNPT 44* 40* 39* 37*      Chemistry Recent Labs  Lab 01/08/25 1236 01/09/25 0742 01/12/25 0605  NA 139 139 138  K 4.0 4.1 3.4*  CL 105 102 99  CO2 24 24 31   GLUCOSE 155* 169* 113*  BUN 12 14 14   CREATININE 0.85 1.03 0.81  CALCIUM  8.6* 9.1 8.7*  MG  --   --  1.8  GFRNONAA >60 >60 >60  ANIONGAP 10 13 7     No results for input(s): PROT, ALBUMIN, AST, ALT, ALKPHOS, BILITOT in the last 168 hours. Lipids No results for input(s): CHOL, TRIG, HDL, LABVLDL, LDLCALC, CHOLHDL in the last 168 hours.  Hematology Recent Labs  Lab 01/08/25 1236 01/09/25 0742 01/12/25 0605  WBC 7.4 4.5 5.9  RBC 3.83* 3.96* 3.65*  HGB 11.1* 11.1* 10.3*  HCT 35.0* 36.9* 33.5*  MCV 91.4 93.2 91.8  MCH 29.0 28.0 28.2  MCHC 31.7 30.1 30.7  RDW 14.6 14.5 14.4  PLT 233 227 207   Thyroid  No results for input(s): TSH, FREET4 in the last 168 hours.  BNP Recent Labs  Lab 01/08/25 1236 01/12/25 0605  PROBNP 3,199.0* 1,129.0*    DDimer  Recent Labs  Lab 01/08/25 1236  DDIMER 1.34*    Radiology/Studies:  No results found.   Assessment and Plan:  1. Acute on recently chronic respiratory failure, suspect multifactorial in context of acute on chronic HFpEF/3rd spacing in setting of ILD, COLD,  hypoalbuminemia, possible underlying liver cirrhosis by prior imaging - cannot rule out non-Covid/flu/RSV viral URI as wife recently sick as well - d-dimer was 1.34 on admission - further w/u initially deferred per notes as patient is on Eliquis  but note this was held last admission for hernia surgery so will defer PE workup to primary team - with several days of diuresis, weight back down to 152 (below OP weight 10/2024) and feeling better, still on O2 - repeat echocardiogram given recent issues with hypoxia - consideration could be given to spironolactone  though BP tends to be low-normal - ordered labs for today - hold off further diuresis pending review with MD - would benefit from close pulm f/u  2. Permanent atrial fibrillation - rate controlled not requiring intervention - continue Eliquis  5mg  BID - had one fall recently without LOC, continue to monitor candidacy as OP  3. Hypokalemia - do not see repletion ordered, repeat labs ordered  for today  addendum: K 3.4-> replete with 40meq x1  4. HTN - BP tending low-normal  5. Moderately dilated aortic root by prior imaging - not previously at surgical dimension nor felt to be a surgical candidate  6. Mitral regurgitation - variable by prior studies, f/u echo pending  7. Brief chest pain 2/3 - see assessment above, otherwise trops remaining low/flat, low suspicion for ACS - follow symptoms   Tentatively have arranged CHF clinic f/u 2/11   Risk Assessment/Risk Scores:       New York  Heart Association (NYHA) Functional Class NYHA Class IV  CHA2DS2-VASc Score = 7   This indicates a 11.2% annual risk of stroke. The patient's score is based upon: CHF History: 1 HTN History: 1 Diabetes History: 0 Stroke History: 2 Vascular Disease History: 1 (aortoiliac atherosclerosis) Age Score: 2 Gender Score: 0       For questions or updates, please contact [facility] Please consult [email or web address] for contact info under       Signed, Jearld Hemp N Shadiamond Koska, PA-C  01/13/2025 9:54 AM     [1]  Allergies Allergen Reactions   Epinephrine Palpitations and Other (See Comments)    Increased Heart Rate, also   Nsaids Other (See Comments)    NOT WHILE TAKING ELIQUIS     Clarithromycin Other (See Comments)    Headaches    Iodinated Contrast Media Other (See Comments)    Pt states turned bright red   Nimodipine Other (See Comments)    Nimodipine, sold under the brand name Nimotop among others, is a calcium  channel blocker used in preventing vasospasm... = Flushing   Pneumococcal Vaccines Hives and Rash   "

## 2025-01-14 LAB — BASIC METABOLIC PANEL WITH GFR
Anion gap: 8 (ref 5–15)
BUN: 15 mg/dL (ref 8–23)
CO2: 29 mmol/L (ref 22–32)
Calcium: 8.8 mg/dL — ABNORMAL LOW (ref 8.9–10.3)
Chloride: 103 mmol/L (ref 98–111)
Creatinine, Ser: 0.83 mg/dL (ref 0.61–1.24)
GFR, Estimated: >60 mL/min
Glucose, Bld: 108 mg/dL — ABNORMAL HIGH (ref 70–99)
Potassium: 3.7 mmol/L (ref 3.5–5.1)
Sodium: 140 mmol/L (ref 135–145)

## 2025-01-14 LAB — CBC
HCT: 33 % — ABNORMAL LOW (ref 39.0–52.0)
Hemoglobin: 10.4 g/dL — ABNORMAL LOW (ref 13.0–17.0)
MCH: 28.7 pg (ref 26.0–34.0)
MCHC: 31.5 g/dL (ref 30.0–36.0)
MCV: 91.2 fL (ref 80.0–100.0)
Platelets: 187 10*3/uL (ref 150–400)
RBC: 3.62 MIL/uL — ABNORMAL LOW (ref 4.22–5.81)
RDW: 14.3 % (ref 11.5–15.5)
WBC: 6.2 10*3/uL (ref 4.0–10.5)
nRBC: 0 % (ref 0.0–0.2)

## 2025-01-14 LAB — MAGNESIUM: Magnesium: 1.6 mg/dL — ABNORMAL LOW (ref 1.7–2.4)

## 2025-01-14 MED ORDER — FUROSEMIDE 10 MG/ML IJ SOLN
60.0000 mg | Freq: Once | INTRAMUSCULAR | Status: AC
  Administered 2025-01-14: 60 mg via INTRAVENOUS
  Filled 2025-01-14: qty 6

## 2025-01-14 MED ORDER — FUROSEMIDE 10 MG/ML IJ SOLN
60.0000 mg | Freq: Every day | INTRAMUSCULAR | Status: AC
  Administered 2025-01-15: 60 mg via INTRAVENOUS
  Filled 2025-01-14: qty 6

## 2025-01-14 NOTE — Progress Notes (Signed)
 Mobility Specialist - Progress Note:   01/14/25 1133  Mobility  Activity Ambulated with assistance  Level of Assistance Contact guard assist, steadying assist  Assistive Device Front wheel walker  Distance Ambulated (ft) 20 ft  Activity Response Tolerated fair  Mobility Referral Yes  Mobility visit 1 Mobility  Mobility Specialist Start Time (ACUTE ONLY) 1035  Mobility Specialist Stop Time (ACUTE ONLY) 1050  Mobility Specialist Time Calculation (min) (ACUTE ONLY) 15 min   Pt was received in bed and agreed to O2 ambulation test:  Nurse requested Mobility Specialist to perform oxygen  saturation test with pt which includes removing pt from oxygen  both at rest and while ambulating.  Below are the results from that testing.     Patient Saturations on Room Air at Rest = spO2 90%  Patient Saturations on Room Air while Ambulating = sp02 85% .  Rested and performed pursed lip breathing for 1 minute with sp02 at 86%.  Patient Saturations on 3 Liters of oxygen  while Ambulating = sp02 90%  At end of testing pt left in room on 3  Liters of oxygen .  Returned to bed with all needs met. Call bell in reach and bed alarm on. Left in room with family.  Report results to RN.  [employer] - Mobility Specialist - Acute Rehabilitation Can be reached via [employer]

## 2025-01-14 NOTE — Progress Notes (Signed)
" °   01/14/25 9389  Provider Notification  Provider Name/Title Andrez, NP  Date Provider Notified 01/14/25  Time Provider Notified [phone]  Method of Notification Page  Notification Reason Other (Comment) (2.02 Sinus pause at 0520 reported by telemetry)  Provider response No new orders  Date of Provider Response 01/14/25  Time of Provider Response [phone]    "

## 2025-01-14 NOTE — Progress Notes (Signed)
 " PROGRESS NOTE    Maurice Wood  FMW:[id number] DOB: [birth year] DOA: 01/08/2025 PCP: Seabron Alm, MD   Brief Narrative: 89 year old male lives at home with his wife with multiple comorbidities including chronic hypoxic respiratory failure(wife reports he has oxygen  at home but he does not use) diastolic heart failure chronic atrial fibrillation on Eliquis  admitted with acute on chronic heart failure.  He came in with complaints of increasing dyspnea lower extremity edema after recent hospitalization for incarcerated inguinal hernia repair on December 31, 2024.  Assessment & Plan:   Principal Problem:   Acute on chronic respiratory failure with hypoxia (HCC) Active Problems:   Acute on chronic heart failure with preserved ejection fraction (HFpEF) (HCC)   #1 acute on chronic hypoxic respiratory failure secondary to diastolic heart failure exacerbation likely from volume overload after recent surgery for inguinal hernia repair.  He was admitted with dyspnea on exertion lower extremity edema chest x-ray showed diffuse pulmonary edema. he was started on diuresis.  Overnight received 500 cc of IV fluid bolus for hypotension.  Received Lasix  80 mg today.  Defer diuresis to cardiology. Cardiology was consulted appreciate their input added on Aldactone  and agreed with diuresis and daily BMP. He is still on 4 L of oxygen  saturating 94% still has lower extremity edema still very weak. He had mildly elevated D-dimer of 1.34 on admission since he was on Eliquis  PE workup was not done.  Noted that Eliquis  was on hold prior to surgery.  However he is getting better with diuresis so we will hold off on PE workup.  #2 permanent atrial fibrillation on Eliquis   #3 hypokalemia potassium 3.7 mag 1.7  #4 history of essential hypertension BP still on the soft side  #5  COPD not in exacerbation continue breztri  and supplemental oxygen  and nebulizer  #6 obstructive sleep apnea uses CPAP at night  #7 BPH on  Flomax    Estimated body mass index is 21.91 kg/m as calculated from the following:   Height as of this encounter: 5' 10 (1.778 m).   Weight as of this encounter: 69.3 kg.  DVT prophylaxis: Eliquis   code Status: Full code  family Communication: Wife at bedside  disposition Plan:  Status is: Inpatient Remains inpatient appropriate because: Acute illness   Consultants:  Cardiology  Procedures: None none Antimicrobials: None   Subjective: Patient seen resting in bed in no acute distress wife at bedside  Overnight patient had an episode of hypotension where his systolic was in 70s to 80s and received 500 mL of IV fluid bolus with improvement in blood pressure  Objective: Vitals:   01/13/25 2103 01/14/25 0541 01/14/25 0738 01/14/25 0852  BP:  97/64    Pulse:  67    Resp:  16    Temp:  97.7 F (36.5 C)    TempSrc:  Oral    SpO2: 95% 95% 93% 94%  Weight:      Height:        Intake/Output Summary (Last 24 hours) at 01/14/2025 1040 Last data filed at 01/14/2025 0609 Gross per 24 hour  Intake 720 ml  Output 1700 ml  Net -980 ml   Filed Weights   01/11/25 0546 01/13/25 0819  Weight: 72.5 kg 69.3 kg    Examination:  General exam: Appears in no acute distress Respiratory system: Few Rales. Respiratory effort normal. Cardiovascular system: irreg Gastrointestinal system: Abdomen is nondistended, soft and nontender. No organomegaly or masses felt. Normal bowel sounds heard. Central nervous system: Alert and  oriented. No focal neurological deficits. Extremities:edema b/l   Data Reviewed: I have personally reviewed following labs and imaging studies  CBC: Recent Labs  Lab 01/08/25 1236 01/09/25 0742 01/12/25 0605 01/13/25 0845 01/14/25 0619  WBC 7.4 4.5 5.9 7.0 6.2  NEUTROABS 6.1  --   --   --   --   HGB 11.1* 11.1* 10.3* 11.3* 10.4*  HCT 35.0* 36.9* 33.5* 37.5* 33.0*  MCV 91.4 93.2 91.8 92.6 91.2  PLT 233 227 207 201 187   Basic Metabolic Panel: Recent Labs   Lab 01/08/25 1236 01/09/25 0742 01/12/25 0605 01/13/25 0845 01/14/25 0619  NA 139 139 138 138 140  K 4.0 4.1 3.4* 3.4* 3.7  CL 105 102 99 99 103  CO2 24 24 31 27 29   GLUCOSE 155* 169* 113* 192* 108*  BUN 12 14 14 12 15   CREATININE 0.85 1.03 0.81 0.91 0.83  CALCIUM  8.6* 9.1 8.7* 8.9 8.8*  MG  --   --  1.8 1.7  --   PHOS  --   --  2.8  --   --    GFR: Estimated Creatinine Clearance: 59.1 mL/min (by C-G formula based on SCr of 0.83 mg/dL). Liver Function Tests: Recent Labs  Lab 01/13/25 0845  AST 29  ALT 19  ALKPHOS 99  BILITOT 1.1  PROT 6.3*  ALBUMIN 3.2*   No results for input(s): LIPASE, AMYLASE in the last 168 hours. No results for input(s): AMMONIA in the last 168 hours. Coagulation Profile: No results for input(s): INR, PROTIME in the last 168 hours. Cardiac Enzymes: No results for input(s): CKTOTAL, CKMB, CKMBINDEX, TROPONINI in the last 168 hours. BNP (last 3 results) Recent Labs    01/08/25 1236 01/12/25 0605  PROBNP 3,199.0* 1,129.0*   HbA1C: No results for input(s): HGBA1C in the last 72 hours. CBG: No results for input(s): GLUCAP in the last 168 hours. Lipid Profile: No results for input(s): CHOL, HDL, LDLCALC, TRIG, CHOLHDL, LDLDIRECT in the last 72 hours. Thyroid  Function Tests: No results for input(s): TSH, T4TOTAL, FREET4, T3FREE, THYROIDAB in the last 72 hours. Anemia Panel: No results for input(s): VITAMINB12, FOLATE, FERRITIN, TIBC, IRON, RETICCTPCT in the last 72 hours. Sepsis Labs: No results for input(s): PROCALCITON, LATICACIDVEN in the last 168 hours.  Recent Results (from the past 240 hours)  Resp panel by RT-PCR (RSV, Flu A&B, Covid) Anterior Nasal Swab     Status: None   Collection Time: 01/08/25 12:36 PM   Specimen: Anterior Nasal Swab  Result Value Ref Range Status   SARS Coronavirus 2 by RT PCR NEGATIVE NEGATIVE Final    Comment: (NOTE) SARS-CoV-2 target nucleic  acids are NOT DETECTED.  The SARS-CoV-2 RNA is generally detectable in upper respiratory specimens during the acute phase of infection. The lowest concentration of SARS-CoV-2 viral copies this assay can detect is 138 copies/mL. A negative result does not preclude SARS-Cov-2 infection and should not be used as the sole basis for treatment or other patient management decisions. A negative result may occur with  improper specimen collection/handling, submission of specimen other than nasopharyngeal swab, presence of viral mutation(s) within the areas targeted by this assay, and inadequate number of viral copies(<138 copies/mL). A negative result must be combined with clinical observations, patient history, and epidemiological information. The expected result is Negative.  Fact Sheet for Patients:  bloggercourse.com  Fact Sheet for Healthcare Providers:  seriousbroker.it  This test is no t yet approved or cleared by the [place]  FDA and  has been authorized for detection and/or diagnosis of SARS-CoV-2 by FDA under an Emergency Use Authorization (EUA). This EUA will remain  in effect (meaning this test can be used) for the duration of the COVID-19 declaration under Section 564(b)(1) of the Act, 21 U.S.C.section 360bbb-3(b)(1), unless the authorization is terminated  or revoked sooner.       Influenza A by PCR NEGATIVE NEGATIVE Final   Influenza B by PCR NEGATIVE NEGATIVE Final    Comment: (NOTE) The Xpert Xpress SARS-CoV-2/FLU/RSV plus assay is intended as an aid in the diagnosis of influenza from Nasopharyngeal swab specimens and should not be used as a sole basis for treatment. Nasal washings and aspirates are unacceptable for Xpert Xpress SARS-CoV-2/FLU/RSV testing.  Fact Sheet for Patients: bloggercourse.com  Fact Sheet for Healthcare Providers: seriousbroker.it  This  test is not yet approved or cleared by the [place]  FDA and has been authorized for detection and/or diagnosis of SARS-CoV-2 by FDA under an Emergency Use Authorization (EUA). This EUA will remain in effect (meaning this test can be used) for the duration of the COVID-19 declaration under Section 564(b)(1) of the Act, 21 U.S.C. section 360bbb-3(b)(1), unless the authorization is terminated or revoked.     Resp Syncytial Virus by PCR NEGATIVE NEGATIVE Final    Comment: (NOTE) Fact Sheet for Patients: bloggercourse.com  Fact Sheet for Healthcare Providers: seriousbroker.it  This test is not yet approved or cleared by the [place]  FDA and has been authorized for detection and/or diagnosis of SARS-CoV-2 by FDA under an Emergency Use Authorization (EUA). This EUA will remain in effect (meaning this test can be used) for the duration of the COVID-19 declaration under Section 564(b)(1) of the Act, 21 U.S.C. section 360bbb-3(b)(1), unless the authorization is terminated or revoked.  Performed at [facility], [address], [place], [place] [address]          Radiology Studies: ECHOCARDIOGRAM COMPLETE Result Date: 01/13/2025    ECHOCARDIOGRAM REPORT   Patient Name:   Jaquane FORBES Chartrand Date of Exam: 01/13/2025 Medical Rec #:  [id number]    Height:       70.0 in Accession #:    [id number]   Weight:       152.7 lb Date of Birth:  28-Jul-1933    BSA:          1.861 m Patient Age:    89 years     BP:           109/66 mmHg Patient Gender: M            HR:           83 bpm. Exam Location:  Inpatient Procedure: 2D Echo, Cardiac Doppler and Color Doppler (Both Spectral and Color            Flow Doppler were utilized during procedure). Indications:    Acute on chronic heart failure with preserved ejection fraction  History:        Patient has prior history of Echocardiogram examinations, most                 recent 03/22/2024.  COPD; Risk Factors:Diabetes, Dyslipidemia and                 Sleep Apnea.  Sonographer:    Philomena Daring Referring Phys: [id number] DAYNA N DUNN IMPRESSIONS  1. Left ventricular ejection fraction, by estimation, is 55 to 60%. The left ventricle has normal function. There is mild concentric left ventricular hypertrophy.  Left ventricular diastolic parameters were normal.  2. Right ventricular systolic function was not well visualized. The right ventricular size is normal.  3. Left atrial size was severely dilated.  4. Right atrial size was moderately dilated.  5. There is no evidence of pericardial effusion.  6. The mitral valve is degenerative. Mild to moderate mitral valve regurgitation. No evidence of mitral stenosis. Moderate mitral annular calcification.  7. The aortic valve is tricuspid. There is mild calcification of the aortic valve. There is moderate thickening of the aortic valve. Aortic valve regurgitation is mild. Aortic valve sclerosis/calcification is present, without any evidence of aortic stenosis.  8. There is mild dilatation of the aortic root, measuring 43 mm. There is mild dilatation of the ascending aorta, measuring 37 mm.  9. The inferior vena cava is normal in size with greater than 50% respiratory variability, suggesting right atrial pressure of 3 mmHg. FINDINGS  Left Ventricle: Left ventricular ejection fraction, by estimation, is 55 to 60%. The left ventricle has normal function. The left ventricular internal cavity size was normal in size. There is mild concentric left ventricular hypertrophy. Left ventricular diastolic parameters were normal. Right Ventricle: The right ventricular size is normal. No increase in right ventricular wall thickness. Right ventricular systolic function was not well visualized. Left Atrium: Left atrial size was severely dilated. Right Atrium: Right atrial size was moderately dilated. Pericardium: There is no evidence of pericardial effusion. Presence of epicardial fat  layer. Mitral Valve: The mitral valve is degenerative in appearance. Moderate mitral annular calcification. Mild to moderate mitral valve regurgitation. No evidence of mitral valve stenosis. Tricuspid Valve: The tricuspid valve is normal in structure. Tricuspid valve regurgitation is trivial. No evidence of tricuspid stenosis. Aortic Valve: The aortic valve is tricuspid. There is mild calcification of the aortic valve. There is moderate thickening of the aortic valve. Aortic valve regurgitation is mild. Aortic valve sclerosis/calcification is present, without any evidence of aortic stenosis. Aortic valve mean gradient measures 8.0 mmHg. Aortic valve peak gradient measures 14.6 mmHg. Aortic valve area, by VTI measures 2.13 cm. Pulmonic Valve: The pulmonic valve was normal in structure. Pulmonic valve regurgitation is not visualized. No evidence of pulmonic stenosis. Aorta: There is mild dilatation of the aortic root, measuring 43 mm. There is mild dilatation of the ascending aorta, measuring 37 mm. Venous: The inferior vena cava is normal in size with greater than 50% respiratory variability, suggesting right atrial pressure of 3 mmHg. IAS/Shunts: No atrial level shunt detected by color flow Doppler.  LEFT VENTRICLE PLAX 2D LVIDd:         4.10 cm   Diastology LVIDs:         2.80 cm   LV e' medial:    5.98 cm/s LV PW:         1.20 cm   LV E/e' medial:  18.2 LV IVS:        1.10 cm   LV e' lateral:   7.07 cm/s LVOT diam:     2.30 cm   LV E/e' lateral: 15.4 LV SV:         72 LV SV Index:   39 LVOT Area:     4.15 cm  RIGHT VENTRICLE             IVC RV S prime:     11.20 cm/s  IVC diam: 1.90 cm TAPSE (M-mode): 1.8 cm LEFT ATRIUM             Index  RIGHT ATRIUM           Index LA diam:        4.40 cm 2.36 cm/m   RA Area:     22.50 cm LA Vol (A2C):   98.6 ml 52.98 ml/m  RA Volume:   63.40 ml  34.06 ml/m LA Vol (A4C):   87.4 ml 46.96 ml/m LA Biplane Vol: 93.6 ml 50.29 ml/m  AORTIC VALVE AV Area (Vmax):    2.12  cm AV Area (Vmean):   2.18 cm AV Area (VTI):     2.13 cm AV Vmax:           191.00 cm/s AV Vmean:          125.000 cm/s AV VTI:            0.338 m AV Peak Grad:      14.6 mmHg AV Mean Grad:      8.0 mmHg LVOT Vmax:         97.40 cm/s LVOT Vmean:        65.600 cm/s LVOT VTI:          0.173 m LVOT/AV VTI ratio: 0.51  AORTA Ao Root diam: 4.30 cm Ao Asc diam:  3.70 cm MITRAL VALVE                TRICUSPID VALVE MV Area (PHT): 2.60 cm     TR Peak grad:   11.3 mmHg MV Decel Time: 292 msec     TR Vmax:        168.00 cm/s MV E velocity: 109.00 cm/s                             SHUNTS                             Systemic VTI:  0.17 m                             Systemic Diam: 2.30 cm Kardie Tobb DO Electronically signed by Kardie Tobb DO Signature Date/Time: 01/13/2025/4:09:49 PM    Final     Scheduled Meds:  apixaban   5 mg Oral BID   budesonide -glycopyrrolate -formoterol   2 puff Inhalation BID   spironolactone   12.5 mg Oral Daily   tamsulosin   0.4 mg Oral Daily   Continuous Infusions:   LOS: 6 days   Almarie KANDICE Hoots, MD 01/14/2025, 10:40 AM   "

## 2025-01-14 NOTE — Plan of Care (Signed)
  Problem: Clinical Measurements: Goal: Ability to maintain clinical measurements within normal limits will improve Outcome: Progressing Goal: Will remain free from infection Outcome: Progressing Goal: Diagnostic test results will improve Outcome: Progressing Goal: Respiratory complications will improve Outcome: Progressing   Problem: Activity: Goal: Risk for activity intolerance will decrease Outcome: Progressing   Problem: Nutrition: Goal: Adequate nutrition will be maintained Outcome: Progressing   Problem: Coping: Goal: Level of anxiety will decrease Outcome: Progressing   Problem: Elimination: Goal: Will not experience complications related to bowel motility Outcome: Progressing Goal: Will not experience complications related to urinary retention Outcome: Progressing   Problem: Pain Managment: Goal: General experience of comfort will improve and/or be controlled Outcome: Progressing   Problem: Safety: Goal: Ability to remain free from injury will improve Outcome: Progressing   Problem: Skin Integrity: Goal: Risk for impaired skin integrity will decrease Outcome: Progressing

## 2025-01-14 NOTE — Progress Notes (Signed)
" °   01/14/25 2238  BiPAP/CPAP/SIPAP  BiPAP/CPAP/SIPAP Pt Type Adult  BiPAP/CPAP/SIPAP Resmed  Reason BIPAP/CPAP not in use Non-compliant (Pt refusing CPAP for the night, RT advised to call if he changes his mind.)    "

## 2025-01-14 NOTE — Plan of Care (Signed)

## 2025-01-14 NOTE — Progress Notes (Signed)
" °   01/14/25 1622  TOC Brief Assessment  Insurance and Status Reviewed  Patient has primary care physician Yes Bertie, Deryck, MD)  Home environment has been reviewed from home with wife  Prior level of function: Independent  Prior/Current Home Services No current home services  Social Drivers of Health Review SDOH reviewed no interventions necessary  Readmission risk has been reviewed Yes  Transition of care needs no transition of care needs at this time    "

## 2025-01-14 NOTE — Progress Notes (Signed)
 "  Rounding Note   Patient Name: Maurice Wood Date of Encounter: 01/14/2025  [facility] Cardiologist: Toribio Fuel, MD   Subjective - No acute events overnight - Still has some SOB but improving.  Remains on oxygen   Scheduled Meds:  apixaban   5 mg Oral BID   budesonide -glycopyrrolate -formoterol   2 puff Inhalation BID   spironolactone   12.5 mg Oral Daily   tamsulosin   0.4 mg Oral Daily   Continuous Infusions:  PRN Meds: acetaminophen  **OR** acetaminophen , alum & mag hydroxide-simeth, guaiFENesin -dextromethorphan , metoCLOPramide  (REGLAN ) injection, traZODone    Vital Signs  Vitals:   01/13/25 2103 01/14/25 0541 01/14/25 0738 01/14/25 0852  BP:  97/64    Pulse:  67    Resp:  16    Temp:  97.7 F (36.5 C)    TempSrc:  Oral    SpO2: 95% 95% 93% 94%  Weight:      Height:        Intake/Output Summary (Last 24 hours) at 01/14/2025 1306 Last data filed at 01/14/2025 9390 Gross per 24 hour  Intake 480 ml  Output 1700 ml  Net -1220 ml      01/13/2025    8:19 AM 01/11/2025    5:46 AM 12/24/2024    5:59 PM  Last 3 Weights  Weight (lbs) 152 lb 11.2 oz 159 lb 13.3 oz 151 lb 14.4 oz  Weight (kg) 69.264 kg 72.5 kg 68.9 kg      Telemetry Rate controlled atrial fibrillation, occasional PVCs- Personally Reviewed  ECG  Rate controlled atrial fibrillation- Personally Reviewed  Physical Exam  GEN: No acute distress.   Neck: No JVD Cardiac: Regular rate with irregularly irregular rhythm, no murmurs, rubs, or gallops.  Respiratory: Bilateral rales, no wheezing or rhonchi GI: Soft, nontender, non-distended  MS: 1+ pitting edema bilaterally; No deformity. Neuro:  Nonfocal  Psych: Normal affect   Labs High Sensitivity Troponin:  No results for input(s): TROPONINIHS in the last 720 hours.  Recent Labs  Lab 01/08/25 1521 01/12/25 1534 01/12/25 1701 01/13/25 1929 01/13/25 2120  TRNPT 40* 39* 37* 46* 48*       Chemistry Recent Labs  Lab 01/12/25 0605  01/13/25 0845 01/14/25 0619  NA 138 138 140  K 3.4* 3.4* 3.7  CL 99 99 103  CO2 31 27 29   GLUCOSE 113* 192* 108*  BUN 14 12 15   CREATININE 0.81 0.91 0.83  CALCIUM  8.7* 8.9 8.8*  MG 1.8 1.7 1.6*  PROT  --  6.3*  --   ALBUMIN  --  3.2*  --   AST  --  29  --   ALT  --  19  --   ALKPHOS  --  99  --   BILITOT  --  1.1  --   GFRNONAA >60 >60 >60  ANIONGAP 7 13 8     Lipids No results for input(s): CHOL, TRIG, HDL, LABVLDL, LDLCALC, CHOLHDL in the last 168 hours.  Hematology Recent Labs  Lab 01/12/25 0605 01/13/25 0845 01/14/25 0619  WBC 5.9 7.0 6.2  RBC 3.65* 4.05* 3.62*  HGB 10.3* 11.3* 10.4*  HCT 33.5* 37.5* 33.0*  MCV 91.8 92.6 91.2  MCH 28.2 27.9 28.7  MCHC 30.7 30.1 31.5  RDW 14.4 14.3 14.3  PLT 207 201 187   Thyroid  No results for input(s): TSH, FREET4 in the last 168 hours.  BNP Recent Labs  Lab 01/08/25 1236 01/12/25 0605  PROBNP 3,199.0* 1,129.0*    DDimer  Recent Labs  Lab  01/08/25 1236  DDIMER 1.34*     Radiology  ECHOCARDIOGRAM COMPLETE Result Date: 01/13/2025    ECHOCARDIOGRAM REPORT   Patient Name:   [name] Date of Exam: 01/13/2025 Medical Rec #:  [id number]    Height:       70.0 in Accession #:    [id number]   Weight:       152.7 lb Date of Birth:  1933/05/05    BSA:          1.861 m Patient Age:    89 years     BP:           109/66 mmHg Patient Gender: M            HR:           83 bpm. Exam Location:  Inpatient Procedure: 2D Echo, Cardiac Doppler and Color Doppler (Both Spectral and Color            Flow Doppler were utilized during procedure). Indications:    Acute on chronic heart failure with preserved ejection fraction  History:        Patient has prior history of Echocardiogram examinations, most                 recent 03/22/2024. COPD; Risk Factors:Diabetes, Dyslipidemia and                 Sleep Apnea.  Sonographer:    Philomena Daring Referring Phys: 25 DAYNA N DUNN IMPRESSIONS  1. Left ventricular ejection fraction, by  estimation, is 55 to 60%. The left ventricle has normal function. There is mild concentric left ventricular hypertrophy. Left ventricular diastolic parameters were normal.  2. Right ventricular systolic function was not well visualized. The right ventricular size is normal.  3. Left atrial size was severely dilated.  4. Right atrial size was moderately dilated.  5. There is no evidence of pericardial effusion.  6. The mitral valve is degenerative. Mild to moderate mitral valve regurgitation. No evidence of mitral stenosis. Moderate mitral annular calcification.  7. The aortic valve is tricuspid. There is mild calcification of the aortic valve. There is moderate thickening of the aortic valve. Aortic valve regurgitation is mild. Aortic valve sclerosis/calcification is present, without any evidence of aortic stenosis.  8. There is mild dilatation of the aortic root, measuring 43 mm. There is mild dilatation of the ascending aorta, measuring 37 mm.  9. The inferior vena cava is normal in size with greater than 50% respiratory variability, suggesting right atrial pressure of 3 mmHg. FINDINGS  Left Ventricle: Left ventricular ejection fraction, by estimation, is 55 to 60%. The left ventricle has normal function. The left ventricular internal cavity size was normal in size. There is mild concentric left ventricular hypertrophy. Left ventricular diastolic parameters were normal. Right Ventricle: The right ventricular size is normal. No increase in right ventricular wall thickness. Right ventricular systolic function was not well visualized. Left Atrium: Left atrial size was severely dilated. Right Atrium: Right atrial size was moderately dilated. Pericardium: There is no evidence of pericardial effusion. Presence of epicardial fat layer. Mitral Valve: The mitral valve is degenerative in appearance. Moderate mitral annular calcification. Mild to moderate mitral valve regurgitation. No evidence of mitral valve stenosis.  Tricuspid Valve: The tricuspid valve is normal in structure. Tricuspid valve regurgitation is trivial. No evidence of tricuspid stenosis. Aortic Valve: The aortic valve is tricuspid. There is mild calcification of the aortic valve. There is moderate thickening of the aortic  valve. Aortic valve regurgitation is mild. Aortic valve sclerosis/calcification is present, without any evidence of aortic stenosis. Aortic valve mean gradient measures 8.0 mmHg. Aortic valve peak gradient measures 14.6 mmHg. Aortic valve area, by VTI measures 2.13 cm. Pulmonic Valve: The pulmonic valve was normal in structure. Pulmonic valve regurgitation is not visualized. No evidence of pulmonic stenosis. Aorta: There is mild dilatation of the aortic root, measuring 43 mm. There is mild dilatation of the ascending aorta, measuring 37 mm. Venous: The inferior vena cava is normal in size with greater than 50% respiratory variability, suggesting right atrial pressure of 3 mmHg. IAS/Shunts: No atrial level shunt detected by color flow Doppler.  LEFT VENTRICLE PLAX 2D LVIDd:         4.10 cm   Diastology LVIDs:         2.80 cm   LV e' medial:    5.98 cm/s LV PW:         1.20 cm   LV E/e' medial:  18.2 LV IVS:        1.10 cm   LV e' lateral:   7.07 cm/s LVOT diam:     2.30 cm   LV E/e' lateral: 15.4 LV SV:         72 LV SV Index:   39 LVOT Area:     4.15 cm  RIGHT VENTRICLE             IVC RV S prime:     11.20 cm/s  IVC diam: 1.90 cm TAPSE (M-mode): 1.8 cm LEFT ATRIUM             Index        RIGHT ATRIUM           Index LA diam:        4.40 cm 2.36 cm/m   RA Area:     22.50 cm LA Vol (A2C):   98.6 ml 52.98 ml/m  RA Volume:   63.40 ml  34.06 ml/m LA Vol (A4C):   87.4 ml 46.96 ml/m LA Biplane Vol: 93.6 ml 50.29 ml/m  AORTIC VALVE AV Area (Vmax):    2.12 cm AV Area (Vmean):   2.18 cm AV Area (VTI):     2.13 cm AV Vmax:           191.00 cm/s AV Vmean:          125.000 cm/s AV VTI:            0.338 m AV Peak Grad:      14.6 mmHg AV Mean Grad:       8.0 mmHg LVOT Vmax:         97.40 cm/s LVOT Vmean:        65.600 cm/s LVOT VTI:          0.173 m LVOT/AV VTI ratio: 0.51  AORTA Ao Root diam: 4.30 cm Ao Asc diam:  3.70 cm MITRAL VALVE                TRICUSPID VALVE MV Area (PHT): 2.60 cm     TR Peak grad:   11.3 mmHg MV Decel Time: 292 msec     TR Vmax:        168.00 cm/s MV E velocity: 109.00 cm/s                             SHUNTS  Systemic VTI:  0.17 m                             Systemic Diam: 2.30 cm Dub Tobb DO Electronically signed by Dub Huntsman DO Signature Date/Time: 01/13/2025/4:09:49 PM    Final     Cardiac Studies   TTE 01/13/25:  IMPRESSIONS     1. Left ventricular ejection fraction, by estimation, is 55 to 60%. The  left ventricle has normal function. There is mild concentric left  ventricular hypertrophy. Left ventricular diastolic parameters were  normal.   2. Right ventricular systolic function was not well visualized. The right  ventricular size is normal.   3. Left atrial size was severely dilated.   4. Right atrial size was moderately dilated.   5. There is no evidence of pericardial effusion.   6. The mitral valve is degenerative. Mild to moderate mitral valve  regurgitation. No evidence of mitral stenosis. Moderate mitral annular  calcification.   7. The aortic valve is tricuspid. There is mild calcification of the  aortic valve. There is moderate thickening of the aortic valve. Aortic  valve regurgitation is mild. Aortic valve sclerosis/calcification is  present, without any evidence of aortic  stenosis.   8. There is mild dilatation of the aortic root, measuring 43 mm. There is  mild dilatation of the ascending aorta, measuring 37 mm.   9. The inferior vena cava is normal in size with greater than 50%  respiratory variability, suggesting right atrial pressure of 3 mmHg.   Patient Profile   RALPH BROUWER is a 89 y.o. male with a hx of permanent atrial fibrillation, OSA on CPAP  followed by pulmonary, HTN, HLD, COPD, ILD with recent chronic respiratory failure requiring 2L home O2, mitral regurgitation, DM, IBS, small intracranial aneurysms with spontaneous dissection of the right internal carotid artery in 2002, moderately dilated aortic root by echo 03/2024, ?retinal vein occlusion per PMH, chorioretinal inflammation of right eye, idiopathic peripheral neuropathy, findings of cirrhotic liver contour/aortoiliac atherosclerosis/remote cerebellar infarcts by CT 09/2024, nephrolithiasis who is being seen 01/13/2025 for the evaluation of CHF at the request of Dr. Leotis.   Assessment & Plan   #Acute on Chronic HFpEF Exacerbation #Acute Hypoxic Respiratory Failure - Patient developed volume overload in the setting of recent surgery with associated hypoxic respiratory failure. - BNP is markedly elevated along with CXR showing diffuse pulmonary edema. - Still appears volume up so we will continue diuresis. Continue IV diuresis Continue spironolactone  12.5 mg daily Daily BMP Strict I's and O's Daily weights  #HTN - Has pretty normal blood pressures at baseline. - Currently added spironolactone  for HFpEF, but his BPs may not tolerated. - We will continue to monitor his blood pressures and adjust accordingly.  #TAA - Known thoracic arctic aneurysm which was seen again on TTE. CTM as outpatient    For questions or updates, please contact [facility] Please consult [email or web address] for contact info under       Signed, Georganna Archer, MD  01/14/2025, 1:06 PM    "

## 2025-01-15 ENCOUNTER — Inpatient Hospital Stay (HOSPITAL_COMMUNITY)

## 2025-01-15 LAB — COMPREHENSIVE METABOLIC PANEL WITH GFR
ALT: 17 U/L (ref 0–44)
AST: 30 U/L (ref 15–41)
Albumin: 3.4 g/dL — ABNORMAL LOW (ref 3.5–5.0)
Alkaline Phosphatase: 101 U/L (ref 38–126)
Anion gap: 13 (ref 5–15)
BUN: 12 mg/dL (ref 8–23)
CO2: 26 mmol/L (ref 22–32)
Calcium: 9.4 mg/dL (ref 8.9–10.3)
Chloride: 99 mmol/L (ref 98–111)
Creatinine, Ser: 0.85 mg/dL (ref 0.61–1.24)
GFR, Estimated: >60 mL/min
Glucose, Bld: 148 mg/dL — ABNORMAL HIGH (ref 70–99)
Potassium: 3.8 mmol/L (ref 3.5–5.1)
Sodium: 139 mmol/L (ref 135–145)
Total Bilirubin: 1.2 mg/dL (ref 0.0–1.2)
Total Protein: 6.7 g/dL (ref 6.5–8.1)

## 2025-01-15 LAB — CBC
HCT: 37.9 % — ABNORMAL LOW (ref 39.0–52.0)
Hemoglobin: 11.7 g/dL — ABNORMAL LOW (ref 13.0–17.0)
MCH: 28 pg (ref 26.0–34.0)
MCHC: 30.9 g/dL (ref 30.0–36.0)
MCV: 90.7 fL (ref 80.0–100.0)
Platelets: 219 10*3/uL (ref 150–400)
RBC: 4.18 MIL/uL — ABNORMAL LOW (ref 4.22–5.81)
RDW: 14.3 % (ref 11.5–15.5)
WBC: 5.8 10*3/uL (ref 4.0–10.5)
nRBC: 0 % (ref 0.0–0.2)

## 2025-01-15 LAB — MAGNESIUM: Magnesium: 2.1 mg/dL (ref 1.7–2.4)

## 2025-01-15 NOTE — Plan of Care (Signed)
  Problem: Clinical Measurements: Goal: Diagnostic test results will improve Outcome: Progressing Goal: Respiratory complications will improve Outcome: Progressing   Problem: Activity: Goal: Risk for activity intolerance will decrease Outcome: Progressing   Problem: Elimination: Goal: Will not experience complications related to bowel motility Outcome: Progressing Goal: Will not experience complications related to urinary retention Outcome: Progressing   Problem: Pain Managment: Goal: General experience of comfort will improve and/or be controlled Outcome: Progressing   Problem: Safety: Goal: Ability to remain free from injury will improve Outcome: Progressing   Problem: Skin Integrity: Goal: Risk for impaired skin integrity will decrease Outcome: Progressing

## 2025-01-15 NOTE — Progress Notes (Signed)
 Physical Therapy Treatment Patient Details Name: Maurice Wood MRN: [id number] DOB: 1932/12/31 Today's Date: 01/15/2025   History of Present Illness 89 y.o. male with medical history significant for permanent A-fib on Eliquis , chronic hypoxic respiratory failure on 2 L due to COPD, heart failure with preserved EF who is being admitted on 01/08/25 to the hospital with acute on chronic hypoxic respiratory failure due to acute on chronic heart failure with preserved EF. Recently discharge from hospital on 1/22  for incarcerated inguinal hernia repair with home health nursing and PT ordered.    PT Comments  Pt agreeable to PT.  Ambulated in hallway but did fatigue easily and mild unsteadiness. Requiring 4 L O2.  Increased PT frequency but otherwise POC remains appropriate - recommend HHPT at d/c/.      If plan is discharge home, recommend the following: Assistance with cooking/housework;Assist for transportation;Help with stairs or ramp for entrance;A little help with bathing/dressing/bathroom   Can travel by private vehicle        Equipment Recommendations  None recommended by PT    Recommendations for Other Services       Precautions / Restrictions Precautions Precautions: None Precaution/Restrictions Comments: monitor sats     Mobility  Bed Mobility Overal bed mobility: Needs Assistance Bed Mobility: Supine to Sit     Supine to sit: Supervision          Transfers Overall transfer level: Needs assistance Equipment used: Rolling walker (2 wheels) Transfers: Sit to/from Stand, Bed to chair/wheelchair/BSC Sit to Stand: Contact guard assist   Step pivot transfers: Contact guard assist       General transfer comment: CGA for safety; mild unsteadiness; cues for hand placement; STS x 2    Ambulation/Gait Ambulation/Gait assistance: Contact guard assist Gait Distance (Feet): 80 Feet Assistive device: Rolling walker (2 wheels) Gait Pattern/deviations: Step-to pattern, Trunk  flexed Gait velocity: decreased     General Gait Details: Multiple cues for RW proximity; fatigued easily   Stairs             Wheelchair Mobility     Tilt Bed    Modified Rankin (Stroke Patients Only)       Balance Overall balance assessment: Needs assistance Sitting-balance support: No upper extremity supported Sitting balance-Leahy Scale: Good     Standing balance support: Bilateral upper extremity supported Standing balance-Leahy Scale: Poor Standing balance comment: RW for support                            Communication    Cognition Arousal: Alert Behavior During Therapy: WFL for tasks assessed/performed   PT - Cognitive impairments: No apparent impairments                       PT - Cognition Comments: AxO x 3 pleasant and motivated.        Cueing    Exercises      General Comments General comments (skin integrity, edema, etc.): Pt on 4 L O2 with sats 95% at arrival ; On 4 L to ambulate sats 93%.  Reports fatigued after walking and no exercises right now.      Pertinent Vitals/Pain Pain Assessment Pain Assessment: No/denies pain    Home Living                          Prior Function  PT Goals (current goals can now be found in the care plan section) Progress towards PT goals: Progressing toward goals    Frequency    Min 2X/week      PT Plan      Co-evaluation              AM-PAC PT 6 Clicks Mobility   Outcome Measure  Help needed turning from your back to your side while in a flat bed without using bedrails?: A Little Help needed moving from lying on your back to sitting on the side of a flat bed without using bedrails?: A Little Help needed moving to and from a bed to a chair (including a wheelchair)?: A Little Help needed standing up from a chair using your arms (e.g., wheelchair or bedside chair)?: A Little Help needed to walk in hospital room?: A Little Help needed  climbing 3-5 steps with a railing? : A Little 6 Click Score: 18    End of Session Equipment Utilized During Treatment: Gait belt Activity Tolerance: Patient limited by fatigue Patient left: in chair;with call bell/phone within reach;with family/visitor present (knows to call for assist up) Nurse Communication: Mobility status PT Visit Diagnosis: Difficulty in walking, not elsewhere classified (R26.2)     Time: 8868-8851 PT Time Calculation (min) (ACUTE ONLY): 17 min  Charges:    $Gait Training: 8-22 mins PT General Charges $$ ACUTE PT VISIT: 1 Visit                     Benjiman, PT Acute Rehab Services [facility] [phone]    Benjiman VEAR Mulberry 01/15/2025, 1:15 PM

## 2025-01-15 NOTE — Consult Note (Signed)
 "  NAME:  Maurice Wood, MRN:  [id number], DOB:  Nov 03, 1933, LOS: 7 ADMISSION DATE:  01/08/2025, CONSULTATION DATE:  01/15/25 REFERRING MD:  TRH, CHIEF COMPLAINT:  DOE, low O2   History of Present Illness:  89 year old man with below past medical history whom we are seeing in consultation for hypoxemia.  Multiple hospital notes reviewed.  Prior pulmonary notes outpatient reviewed.  Cardiology notes reviewed.  Admit to the hospital last month.  Discharged with new oxygen  requirement.  Chest x-ray revealed small bilateral pleural effusions.  His diuretic regimen was not changed despite needing new oxygen  presume related to volume overload.  Came back.  More volume overloaded on imaging on my review interpretation.  BNP markedly elevated.  Has been diuresed.  He started to feel better.  No edema.  Requiring 4 L at rest currently.  On discharge it was 2 L.  Reviewed serial CT scans including high-resolution CT scan 2023 that showed mild interlobular septal thickening has a bibasilar opacities, atelectasis versus early ILD versus coarse changes related to emphysema on my review interpretation.  We did serial cross-sectional images CT abdomen pelvis CT renal stones etc. in the interim that shows no worsening of these findings on my review and interpretation.  CT chest scan obtained today ordered by cardiologist reveals bilateral pleural effusions, increased interlobular septal thickening with dependent predilection concerning for pulmonary edema, cardiogenic volume overload on my review interpretation.  Echocardiogram reveals mitral and aortic valve insufficiency with dye the left atrium which demonstrates chronic pulmonary venous hypertension.  Pertinent  Medical History  Mild ILD, emphysema, congestive heart failure, valvular insufficiency  Significant Hospital Events: Including procedures, antibiotic start and stop dates in addition to other pertinent events   01/08/2025 admitted to the hospital 01/15/2025  PCCM consult  Interim History / Subjective:    Objective    Blood pressure (!) 107/59, pulse 64, temperature 97.8 F (36.6 C), temperature source Oral, resp. rate 16, height 5' 10 (1.778 m), weight 69.3 kg, SpO2 97%.        Intake/Output Summary (Last 24 hours) at 01/15/2025 1229 Last data filed at 01/15/2025 9386 Gross per 24 hour  Intake 240 ml  Output 850 ml  Net -610 ml   Filed Weights   01/11/25 0546 01/13/25 0819  Weight: 72.5 kg 69.3 kg    Examination: General: Lying in bed, chronically ill-appearing, frail appearing HENT: Unkempt beard, atraumatic normocephalic Lungs: Clear, no work of breathing on 4 L Cardiovascular: No edema noted, warm   Resolved problem list   Assessment and Plan   Acute on chronic hypoxic and respiratory failure: Likely large and driven by underlying emphysema and volume overload.  Recently discharged with new oxygen  requirement, family unclear how much.  2 to 3 L report.  Discharge summary reveals 2 L..  Now on 4 L.  Oxygen  saturation is more than adequate at 97%.  BNP is elevated.  Chest x-ray shows no concerning signs for ILD progression.  Frankly ILD is minimal on historical image as discussed below.  CT scan of the chest obtained by cardiologist reveals small bilateral pleural effusions and increased interlobular septal thickening in dependent areas most consistent with pulmonary edema on my review and interpretation.  Suspect this is related to chronic pulmonary venous hypertension as a sequela of aortic and mitral valve regurgitation as evidenced by severely dilated left atrium on echocardiogram this admission. -- Continue aggressive IV diuresis as blood pressure and kidney function allows -- Wean oxygen  as tolerated, goal O2  sat 88% -- Will need ambulatory oxygen  assessment prior to discharge and updated oxygen  orders placed if needed  Interstitial lung disease: Mild if present at all.  Could be course emphysematous changes.  Prone imaging  could be helpful to eliminate the possibility of atelectasis or confirm atelectasis as culprit with faint basilar infiltrates.  Given his hiatal hernia, I think there is mild fibrosis but suspect it is most likely related to chronic aspiration.  No changes on review of serial recent cross-sectional images, albeit limited views as none of the chest. -- Encourage outpatient follow-up and serial imaging and PFTs as directed by primary pulmonologist  Emphysema: Seen on serial chest images. -- Continue Breztri  as inpatient, resume home Trelegy on discharge  PCCM will sign off.  Please encourage patient to follow up with his pulmonologist in the atrium system on discharge.  Labs   CBC: Recent Labs  Lab 01/08/25 1236 01/09/25 0742 01/12/25 0605 01/13/25 0845 01/14/25 0619 01/15/25 0850  WBC 7.4 4.5 5.9 7.0 6.2 5.8  NEUTROABS 6.1  --   --   --   --   --   HGB 11.1* 11.1* 10.3* 11.3* 10.4* 11.7*  HCT 35.0* 36.9* 33.5* 37.5* 33.0* 37.9*  MCV 91.4 93.2 91.8 92.6 91.2 90.7  PLT 233 227 207 201 187 219    Basic Metabolic Panel: Recent Labs  Lab 01/09/25 0742 01/12/25 0605 01/13/25 0845 01/14/25 0619 01/15/25 0850  NA 139 138 138 140 139  K 4.1 3.4* 3.4* 3.7 3.8  CL 102 99 99 103 99  CO2 24 31 27 29 26   GLUCOSE 169* 113* 192* 108* 148*  BUN 14 14 12 15 12   CREATININE 1.03 0.81 0.91 0.83 0.85  CALCIUM  9.1 8.7* 8.9 8.8* 9.4  MG  --  1.8 1.7 1.6* 2.1  PHOS  --  2.8  --   --   --    GFR: Estimated Creatinine Clearance: 57.8 mL/min (by C-G formula based on SCr of 0.85 mg/dL). Recent Labs  Lab 01/12/25 0605 01/13/25 0845 01/14/25 0619 01/15/25 0850  WBC 5.9 7.0 6.2 5.8    Liver Function Tests: Recent Labs  Lab 01/13/25 0845 01/15/25 0850  AST 29 30  ALT 19 17  ALKPHOS 99 101  BILITOT 1.1 1.2  PROT 6.3* 6.7  ALBUMIN 3.2* 3.4*   No results for input(s): LIPASE, AMYLASE in the last 168 hours. No results for input(s): AMMONIA in the last 168 hours.  ABG     Component Value Date/Time   PHART 7.365 07/08/2020 0753   PCO2ART 39.6 07/08/2020 0753   PO2ART 91 07/08/2020 0753   HCO3 26.4 03/21/2024 2245   TCO2 23 10/02/2024 1223   ACIDBASEDEF 1.0 07/08/2020 0757   O2SAT 20.6 03/21/2024 2245     Coagulation Profile: No results for input(s): INR, PROTIME in the last 168 hours.  Cardiac Enzymes: No results for input(s): CKTOTAL, CKMB, CKMBINDEX, TROPONINI in the last 168 hours.  HbA1C: Hgb A1c MFr Bld  Date/Time Value Ref Range Status  03/22/2024 03:55 AM 6.2 (H) 4.8 - 5.6 % Final    Comment:    (NOTE)         Prediabetes: 5.7 - 6.4         Diabetes: >6.4         Glycemic control for adults with diabetes: <7.0   05/30/2014 04:59 PM 5.8 (H) <5.7 % Final    Comment:    (NOTE)  According to the ADA Clinical Practice Recommendations for 2011, when HbA1c is used as a screening test:  >=6.5%   Diagnostic of Diabetes Mellitus           (if abnormal result is confirmed) 5.7-6.4%   Increased risk of developing Diabetes Mellitus References:Diagnosis and Classification of Diabetes Mellitus,Diabetes Care,2011,34(Suppl 1):S62-S69 and Standards of Medical Care in         Diabetes - 2011,Diabetes Care,2011,34 (Suppl 1):S11-S61.    CBG: No results for input(s): GLUCAP in the last 168 hours.  Review of Systems:   Comprehensive review systems negative unless mentioned in HPI above  Past Medical History:  He,  has a past medical history of Allergy, Aortic atherosclerosis, Benign prostatic hypertrophy, Cerebellar infarction (HCC), Cerebral aneurysm, CHF (congestive heart failure) (HCC), Chronic heart failure with preserved ejection fraction (HFpEF) (HCC), Chronic hypoxic respiratory failure (HCC), COPD (chronic obstructive pulmonary disease) (HCC), Dilated aortic root, Fatigue, History of chest pain, colonic polyps, Hyperlipidemia, IBS (irritable bowel syndrome), ILD  (interstitial lung disease) (HCC), Incontinence, Kidney stones, Mitral regurgitation, Obesity, Obstructive sleep apnea, Permanent atrial fibrillation (HCC), and Retinal vein occlusion (HCC).   Surgical History:   Past Surgical History:  Procedure Laterality Date   CARDIAC CATHETERIZATION  1999   CHOLECYSTECTOMY     HERNIA REPAIR     inguinal and umbilical herniorrhaphy     INGUINAL HERNIA REPAIR Right 12/24/2024   Procedure: REPAIR, HERNIA, INGUINAL, ADULT WITH MESH, DIAGNOSTIC LAPAROSCOPY;  Surgeon: Ebbie Cough, MD;  Location: [facility] ORS;  Service: General;  Laterality: Right;   INTERSTIM IMPLANT PLACEMENT  04/2017   LEFT HEART CATHETERIZATION WITH CORONARY ANGIOGRAM N/A 06/01/2014   Procedure: LEFT HEART CATHETERIZATION WITH CORONARY ANGIOGRAM;  Surgeon: Peter M Jordan, MD;  Location[facility] CATH LAB;  Service: Cardiovascular;  Laterality: N/A;   LITHOTRIPSY     RIGHT/LEFT HEART CATH AND CORONARY ANGIOGRAPHY N/A 07/08/2020   Procedure: RIGHT/LEFT HEART CATH AND CORONARY ANGIOGRAPHY;  Surgeon: Cherrie Toribio SAUNDERS, MD;  Location: [facility] INVASIVE CV LAB;  Service: Cardiovascular;  Laterality: N/A;   TEE WITHOUT CARDIOVERSION N/A 07/05/2020   Procedure: TRANSESOPHAGEAL ECHOCARDIOGRAM (TEE);  Surgeon: Cherrie Toribio SAUNDERS, MD;  Location: [facility] ENDOSCOPY;  Service: Cardiovascular;  Laterality: N/A;   TEE WITHOUT CARDIOVERSION N/A 05/21/2023   Procedure: TRANSESOPHAGEAL ECHOCARDIOGRAM;  Surgeon: Cherrie Toribio SAUNDERS, MD;  Location: [facility] INVASIVE CV LAB;  Service: Cardiovascular;  Laterality: N/A;   TONSILLECTOMY AND ADENOIDECTOMY  as a child     Social History:   reports that he quit smoking about 53 years ago. His smoking use included cigarettes. He started smoking about 73 years ago. He has a 20 pack-year smoking history. He has never used smokeless tobacco. He reports current alcohol use of about 1.0 standard drink of alcohol per week. He reports that he does not use drugs.   Family History:  His family  history includes Alcohol abuse in his father; Aneurysm in his father; Cancer in his father and maternal grandmother; Diabetes in his maternal uncle, paternal uncle, and son; Heart disease in his paternal grandfather; Sudden death in his mother.   Allergies Allergies[1]   Home Medications  Prior to Admission medications  Medication Sig Start Date End Date Taking? Authorizing Provider  albuterol  (VENTOLIN  HFA) 108 (90 Base) MCG/ACT inhaler Inhale 2 puffs into the lungs every 6 (six) hours as needed for wheezing or shortness of breath. 09/17/23  Yes Cobb, Comer GAILS, NP  cholecalciferol (VITAMIN D3) 25 MCG (1000 UNIT) tablet Take 1,000 Units by mouth daily.  Yes [provider]  ELIQUIS  5 MG TABS tablet TAKE 1 TABLET BY MOUTH TWICE A DAY 10/29/24  Yes [name], Toribio SAUNDERS, MD  fluticasone  (FLONASE ) 50 MCG/ACT nasal spray Place 1 spray into both nostrils 2 (two) times daily as needed for rhinitis or allergies.   Yes [provider]  Fluticasone -Umeclidin-Vilant (TRELEGY ELLIPTA ) 100-62.5-25 MCG/ACT AEPB INHALE 1 PUFF INTO THE LUNGS DAILY IN THE AFTERNOON 08/28/23  Yes Sood, Vineet, MD  furosemide  (LASIX ) 40 MG tablet Take 1 tablet (40 mg total) by mouth once a week. ONLY ON MONDAY'S Patient taking differently: Take 40 mg by mouth every Monday. 10/22/24  Yes [name], Toribio SAUNDERS, MD  HYDROcodone -acetaminophen  (NORCO/VICODIN) 5-325 MG tablet Take 1 tablet by mouth every 4 (four) hours as needed. Patient taking differently: Take 1 tablet by mouth every 4 (four) hours as needed for severe pain (pain score 7-10). 10/18/24  Yes Dean Clarity, MD  nitroGLYCERIN  (NITROSTAT ) 0.4 MG SL tablet Place 1 tablet (0.4 mg total) under the tongue every 5 (five) minutes as needed for chest pain. 06/12/23 10/22/25 Yes Milford, Harlene HERO, FNP  ondansetron  (ZOFRAN -ODT) 4 MG disintegrating tablet Take 1 tablet (4 mg total) by mouth every 8 (eight) hours as needed for nausea or vomiting. Patient taking  differently: Take 4 mg by mouth every 8 (eight) hours as needed for nausea or vomiting (dissolve orally). 10/18/24  Yes Dean Clarity, MD  potassium chloride  SA (KLOR-CON  M) 20 MEQ tablet Take 2 tablets (40 mEq total) by mouth 2 (two) times a week. Every Mon and Fri Patient taking differently: Take 40 mEq by mouth See admin instructions. Take 40 mEq by mouth on Mondays and Fridays 06/01/24  Yes [name], Toribio SAUNDERS, MD  PRILOSEC OTC 20 MG tablet Take 20 mg by mouth daily as needed (for acid reflux).   Yes [provider]  tamsulosin  (FLOMAX ) 0.4 MG CAPS capsule Take 1 capsule (0.4 mg total) by mouth daily. 12/31/24 01/30/25 Yes Perri DELENA Meliton Mickey., MD  TYLENOL  500 MG tablet Take 500 mg by mouth every 6 (six) hours as needed for mild pain (pain score 1-3) (or headaches).   Yes [provider]     Critical care time: n/a    Donnice SAUNDERS Beals, MD See TRACEY          [1]  Allergies Allergen Reactions   Epinephrine Palpitations and Other (See Comments)    Increased Heart Rate, also   Nsaids Other (See Comments)    NOT WHILE TAKING ELIQUIS     Clarithromycin Other (See Comments)    Headaches    Iodinated Contrast Media Other (See Comments)    Pt states turned bright red   Nimodipine Other (See Comments)    Nimodipine, sold under the brand name Nimotop among others, is a calcium  channel blocker used in preventing vasospasm... = Flushing   Pneumococcal Vaccines Hives and Rash   "

## 2025-01-15 NOTE — Progress Notes (Signed)
 "  Rounding Note   Patient Name: Maurice Wood Date of Encounter: 01/15/2025  [facility] Cardiologist: Toribio Fuel, MD   Subjective - Earlier this morning the patient became hypoxic requiring increase in his oxygen  requirement. - Says that he feels okay currently but remains on 4 L  Scheduled Meds:  apixaban   5 mg Oral BID   budesonide -glycopyrrolate -formoterol   2 puff Inhalation BID   furosemide   60 mg Intravenous Daily   spironolactone   12.5 mg Oral Daily   tamsulosin   0.4 mg Oral Daily   Continuous Infusions:  PRN Meds: acetaminophen  **OR** acetaminophen , alum & mag hydroxide-simeth, guaiFENesin -dextromethorphan , metoCLOPramide  (REGLAN ) injection, traZODone    Vital Signs  Vitals:   01/14/25 0852 01/14/25 1328 01/14/25 1957 01/15/25 0559  BP:  109/64 101/64 (!) 107/59  Pulse:  66 74 64  Resp:  (!) 22 16 16   Temp:  98 F (36.7 C) 97.8 F (36.6 C) 97.8 F (36.6 C)  TempSrc:  Oral Oral Oral  SpO2: 94% 97% 96% 97%  Weight:      Height:        Intake/Output Summary (Last 24 hours) at 01/15/2025 1011 Last data filed at 01/15/2025 9386 Gross per 24 hour  Intake 240 ml  Output 850 ml  Net -610 ml      01/13/2025    8:19 AM 01/11/2025    5:46 AM 12/24/2024    5:59 PM  Last 3 Weights  Weight (lbs) 152 lb 11.2 oz 159 lb 13.3 oz 151 lb 14.4 oz  Weight (kg) 69.264 kg 72.5 kg 68.9 kg      Telemetry Rate controlled atrial fibrillation, occasional PVCs- Personally Reviewed  ECG  Rate controlled atrial fibrillation- Personally Reviewed  Physical Exam  GEN: No acute distress.   Neck: No JVD Cardiac: Regular rate with irregularly irregular rhythm, no murmurs, rubs, or gallops.  Respiratory: Increased bibasilar rales compared to yesterday, no wheezing or rhonchi GI: Soft, nontender, non-distended  MS: No edema; No deformity. Neuro:  Nonfocal  Psych: Normal affect   Labs High Sensitivity Troponin:  No results for input(s): TROPONINIHS in the last 720  hours.  Recent Labs  Lab 01/08/25 1521 01/12/25 1534 01/12/25 1701 01/13/25 1929 01/13/25 2120  TRNPT 40* 39* 37* 46* 48*       Chemistry Recent Labs  Lab 01/12/25 0605 01/13/25 0845 01/14/25 0619  NA 138 138 140  K 3.4* 3.4* 3.7  CL 99 99 103  CO2 31 27 29   GLUCOSE 113* 192* 108*  BUN 14 12 15   CREATININE 0.81 0.91 0.83  CALCIUM  8.7* 8.9 8.8*  MG 1.8 1.7 1.6*  PROT  --  6.3*  --   ALBUMIN  --  3.2*  --   AST  --  29  --   ALT  --  19  --   ALKPHOS  --  99  --   BILITOT  --  1.1  --   GFRNONAA >60 >60 >60  ANIONGAP 7 13 8     Lipids No results for input(s): CHOL, TRIG, HDL, LABVLDL, LDLCALC, CHOLHDL in the last 168 hours.  Hematology Recent Labs  Lab 01/13/25 0845 01/14/25 0619 01/15/25 0850  WBC 7.0 6.2 5.8  RBC 4.05* 3.62* 4.18*  HGB 11.3* 10.4* 11.7*  HCT 37.5* 33.0* 37.9*  MCV 92.6 91.2 90.7  MCH 27.9 28.7 28.0  MCHC 30.1 31.5 30.9  RDW 14.3 14.3 14.3  PLT 201 187 219   Thyroid  No results for input(s): TSH, FREET4  in the last 168 hours.  BNP Recent Labs  Lab 01/08/25 1236 01/12/25 0605  PROBNP 3,199.0* 1,129.0*    DDimer  Recent Labs  Lab 01/08/25 1236  DDIMER 1.34*     Radiology  No results found.   Cardiac Studies   TTE 01/13/25:  IMPRESSIONS     1. Left ventricular ejection fraction, by estimation, is 55 to 60%. The  left ventricle has normal function. There is mild concentric left  ventricular hypertrophy. Left ventricular diastolic parameters were  normal.   2. Right ventricular systolic function was not well visualized. The right  ventricular size is normal.   3. Left atrial size was severely dilated.   4. Right atrial size was moderately dilated.   5. There is no evidence of pericardial effusion.   6. The mitral valve is degenerative. Mild to moderate mitral valve  regurgitation. No evidence of mitral stenosis. Moderate mitral annular  calcification.   7. The aortic valve is tricuspid. There is mild  calcification of the  aortic valve. There is moderate thickening of the aortic valve. Aortic  valve regurgitation is mild. Aortic valve sclerosis/calcification is  present, without any evidence of aortic  stenosis.   8. There is mild dilatation of the aortic root, measuring 43 mm. There is  mild dilatation of the ascending aorta, measuring 37 mm.   9. The inferior vena cava is normal in size with greater than 50%  respiratory variability, suggesting right atrial pressure of 3 mmHg.   Patient Profile   Maurice Wood is a 89 y.o. male with a hx of permanent atrial fibrillation, OSA on CPAP followed by pulmonary, HTN, HLD, COPD, ILD with recent chronic respiratory failure requiring 2L home O2, mitral regurgitation, DM, IBS, small intracranial aneurysms with spontaneous dissection of the right internal carotid artery in 2002, moderately dilated aortic root by echo 03/2024, ?retinal vein occlusion per PMH, chorioretinal inflammation of right eye, idiopathic peripheral neuropathy, findings of cirrhotic liver contour/aortoiliac atherosclerosis/remote cerebellar infarcts by CT 09/2024, nephrolithiasis who is being seen 01/13/2025 for the evaluation of CHF at the request of Dr. Leotis.   Assessment & Plan   #Acute on Chronic HFpEF Exacerbation #Possible ILD #Acute Hypoxic Respiratory Failure - Patient developed volume overload in the setting of recent surgery with associated hypoxic respiratory failure. - BNP is markedly elevated along with CXR showing diffuse pulmonary edema. - The patient has had great diuresis yet his oxygen  requirement continues to climb and his lungs sound worse.  He carries a history of ILD in the chart; however, patient is unaware of this diagnosis.  I will get a CT of his chest today to see what his lungs look like.  I also instructed the patient's wife to call his pulmonologist to see if he does not fact have ILD. Continue IV diuresis Continue spironolactone  12.5 mg daily Daily  BMP Strict I's and O's Daily weights  #HTN - Has pretty normal blood pressures at baseline. - Currently added spironolactone  for HFpEF, but his BPs may not tolerated. - We will continue to monitor his blood pressures and adjust accordingly.  #TAA - Known thoracic arctic aneurysm which was seen again on TTE. CTM as outpatient    For questions or updates, please contact [facility] Please consult [email or web address] for contact info under       Signed, Georganna Archer, MD  01/15/2025, 10:11 AM    "

## 2025-01-15 NOTE — Progress Notes (Signed)
" °   01/15/25 2205  BiPAP/CPAP/SIPAP  Reason BIPAP/CPAP not in use Non-compliant (patient stated he did not want to use cpap tonight)    "

## 2025-01-15 NOTE — Progress Notes (Signed)
 " PROGRESS NOTE    Maurice Wood  FMW:[id number] DOB: 08-24-1933 DOA: 01/08/2025 PCP: Seabron Alm, MD   Brief Narrative: 89 year old male lives at home with his wife with multiple comorbidities including chronic hypoxic respiratory failure(wife reports he has oxygen  at home but he does not use) diastolic heart failure chronic atrial fibrillation on Eliquis  admitted with acute on chronic heart failure.  He came in with complaints of increasing dyspnea lower extremity edema after recent hospitalization for incarcerated inguinal hernia repair on December 31, 2024.  Assessment & Plan:   Principal Problem:   Acute on chronic respiratory failure with hypoxia (HCC) Active Problems:   Acute on chronic heart failure with preserved ejection fraction (HFpEF) (HCC)   #1 acute on chronic hypoxic respiratory failure secondary to diastolic heart failure exacerbation likely from volume overload after recent surgery for inguinal hernia repair.  He was admitted with dyspnea on exertion lower extremity edema chest x-ray showed diffuse pulmonary edema. Followed by cardiology,getting diuresed. Currently on lasix  and aldactone   Daily bmp On 3.5 L O2 Ct chest pending from today He had mildly elevated D-dimer of 1.34   #2 permanent atrial fibrillation on Eliquis   #3 hypokalemia /hypomagnesemia resolved   #4 history of essential hypertension BP still on the soft side  #5  COPD with mild ILD-not in exacerbation continue breztri  and supplemental oxygen  and nebulizer Encourage IS  #6 obstructive sleep apnea uses CPAP at night  #7 BPH on Flomax    Estimated body mass index is 21.91 kg/m as calculated from the following:   Height as of this encounter: 5' 10 (1.778 m).   Weight as of this encounter: 69.3 kg.  DVT prophylaxis: Eliquis   code Status: Full code  family Communication: Wife at bedside  disposition Plan:  Status is: Inpatient Remains inpatient appropriate because: Acute illness    Consultants:  Cardiology  Procedures: None none Antimicrobials: None   Subjective:  Was hypoxic around 5 am today Wife at bedside He feels better now  Objective: Vitals:   01/14/25 0852 01/14/25 1328 01/14/25 1957 01/15/25 0559  BP:  109/64 101/64 (!) 107/59  Pulse:  66 74 64  Resp:  (!) 22 16 16   Temp:  98 F (36.7 C) 97.8 F (36.6 C) 97.8 F (36.6 C)  TempSrc:  Oral Oral Oral  SpO2: 94% 97% 96% 97%  Weight:      Height:        Intake/Output Summary (Last 24 hours) at 01/15/2025 1147 Last data filed at 01/15/2025 9386 Gross per 24 hour  Intake 240 ml  Output 850 ml  Net -610 ml   Filed Weights   01/11/25 0546 01/13/25 0819  Weight: 72.5 kg 69.3 kg    Examination:  General exam: Appears in no acute distress Respiratory system: crackles Respiratory effort normal. Cardiovascular system: irreg Gastrointestinal system: Abdomen is nondistended, soft and nontender. No organomegaly or masses felt. Normal bowel sounds heard. Central nervous system: Alert and oriented. No focal neurological deficits. Extremities:edema b/l better than yest   Data Reviewed: I have personally reviewed following labs and imaging studies  CBC: Recent Labs  Lab 01/08/25 1236 01/09/25 0742 01/12/25 0605 01/13/25 0845 01/14/25 0619 01/15/25 0850  WBC 7.4 4.5 5.9 7.0 6.2 5.8  NEUTROABS 6.1  --   --   --   --   --   HGB 11.1* 11.1* 10.3* 11.3* 10.4* 11.7*  HCT 35.0* 36.9* 33.5* 37.5* 33.0* 37.9*  MCV 91.4 93.2 91.8 92.6 91.2 90.7  PLT 233  227 207 201 187 219   Basic Metabolic Panel: Recent Labs  Lab 01/09/25 0742 01/12/25 0605 01/13/25 0845 01/14/25 0619 01/15/25 0850  NA 139 138 138 140 139  K 4.1 3.4* 3.4* 3.7 3.8  CL 102 99 99 103 99  CO2 24 31 27 29 26   GLUCOSE 169* 113* 192* 108* 148*  BUN 14 14 12 15 12   CREATININE 1.03 0.81 0.91 0.83 0.85  CALCIUM  9.1 8.7* 8.9 8.8* 9.4  MG  --  1.8 1.7 1.6* 2.1  PHOS  --  2.8  --   --   --    GFR: Estimated Creatinine  Clearance: 57.8 mL/min (by C-G formula based on SCr of 0.85 mg/dL). Liver Function Tests: Recent Labs  Lab 01/13/25 0845 01/15/25 0850  AST 29 30  ALT 19 17  ALKPHOS 99 101  BILITOT 1.1 1.2  PROT 6.3* 6.7  ALBUMIN 3.2* 3.4*   No results for input(s): LIPASE, AMYLASE in the last 168 hours. No results for input(s): AMMONIA in the last 168 hours. Coagulation Profile: No results for input(s): INR, PROTIME in the last 168 hours. Cardiac Enzymes: No results for input(s): CKTOTAL, CKMB, CKMBINDEX, TROPONINI in the last 168 hours. BNP (last 3 results) Recent Labs    01/08/25 1236 01/12/25 0605  PROBNP 3,199.0* 1,129.0*   HbA1C: No results for input(s): HGBA1C in the last 72 hours. CBG: No results for input(s): GLUCAP in the last 168 hours. Lipid Profile: No results for input(s): CHOL, HDL, LDLCALC, TRIG, CHOLHDL, LDLDIRECT in the last 72 hours. Thyroid  Function Tests: No results for input(s): TSH, T4TOTAL, FREET4, T3FREE, THYROIDAB in the last 72 hours. Anemia Panel: No results for input(s): VITAMINB12, FOLATE, FERRITIN, TIBC, IRON, RETICCTPCT in the last 72 hours. Sepsis Labs: No results for input(s): PROCALCITON, LATICACIDVEN in the last 168 hours.  Recent Results (from the past 240 hours)  Resp panel by RT-PCR (RSV, Flu A&B, Covid) Anterior Nasal Swab     Status: None   Collection Time: 01/08/25 12:36 PM   Specimen: Anterior Nasal Swab  Result Value Ref Range Status   SARS Coronavirus 2 by RT PCR NEGATIVE NEGATIVE Final    Comment: (NOTE) SARS-CoV-2 target nucleic acids are NOT DETECTED.  The SARS-CoV-2 RNA is generally detectable in upper respiratory specimens during the acute phase of infection. The lowest concentration of SARS-CoV-2 viral copies this assay can detect is 138 copies/mL. A negative result does not preclude SARS-Cov-2 infection and should not be used as the sole basis for treatment or other  patient management decisions. A negative result may occur with  improper specimen collection/handling, submission of specimen other than nasopharyngeal swab, presence of viral mutation(s) within the areas targeted by this assay, and inadequate number of viral copies(<138 copies/mL). A negative result must be combined with clinical observations, patient history, and epidemiological information. The expected result is Negative.  Fact Sheet for Patients:  bloggercourse.com  Fact Sheet for Healthcare Providers:  seriousbroker.it  This test is no t yet approved or cleared by the [place]  FDA and  has been authorized for detection and/or diagnosis of SARS-CoV-2 by FDA under an Emergency Use Authorization (EUA). This EUA will remain  in effect (meaning this test can be used) for the duration of the COVID-19 declaration under Section 564(b)(1) of the Act, 21 U.S.C.section 360bbb-3(b)(1), unless the authorization is terminated  or revoked sooner.       Influenza A by PCR NEGATIVE NEGATIVE Final   Influenza B by PCR NEGATIVE NEGATIVE Final  Comment: (NOTE) The Xpert Xpress SARS-CoV-2/FLU/RSV plus assay is intended as an aid in the diagnosis of influenza from Nasopharyngeal swab specimens and should not be used as a sole basis for treatment. Nasal washings and aspirates are unacceptable for Xpert Xpress SARS-CoV-2/FLU/RSV testing.  Fact Sheet for Patients: bloggercourse.com  Fact Sheet for Healthcare Providers: seriousbroker.it  This test is not yet approved or cleared by the [place]  FDA and has been authorized for detection and/or diagnosis of SARS-CoV-2 by FDA under an Emergency Use Authorization (EUA). This EUA will remain in effect (meaning this test can be used) for the duration of the COVID-19 declaration under Section 564(b)(1) of the Act, 21 U.S.C. section  360bbb-3(b)(1), unless the authorization is terminated or revoked.     Resp Syncytial Virus by PCR NEGATIVE NEGATIVE Final    Comment: (NOTE) Fact Sheet for Patients: bloggercourse.com  Fact Sheet for Healthcare Providers: seriousbroker.it  This test is not yet approved or cleared by the [place]  FDA and has been authorized for detection and/or diagnosis of SARS-CoV-2 by FDA under an Emergency Use Authorization (EUA). This EUA will remain in effect (meaning this test can be used) for the duration of the COVID-19 declaration under Section 564(b)(1) of the Act, 21 U.S.C. section 360bbb-3(b)(1), unless the authorization is terminated or revoked.  Performed at [facility], [address] [place], [place] [address]          Radiology Studies: No results found.   Scheduled Meds:  apixaban   5 mg Oral BID   budesonide -glycopyrrolate -formoterol   2 puff Inhalation BID   furosemide   60 mg Intravenous Daily   spironolactone   12.5 mg Oral Daily   tamsulosin   0.4 mg Oral Daily   Continuous Infusions:   LOS: 7 days   Almarie KANDICE Hoots, MD 01/15/2025, 11:47 AM   "

## 2025-01-20 ENCOUNTER — Ambulatory Visit (HOSPITAL_COMMUNITY)

## 2025-01-25 ENCOUNTER — Ambulatory Visit: Admitting: Podiatry
